# Patient Record
Sex: Male | Born: 1955 | Race: White | Hispanic: No | Marital: Single | Smoking: Never smoker
Health system: Southern US, Community
[De-identification: ages and names within clinical notes are randomized; demographics above are authoritative.]

## PROBLEM LIST (undated history)

## (undated) DIAGNOSIS — E039 Hypothyroidism, unspecified: Secondary | ICD-10-CM

## (undated) DIAGNOSIS — R1310 Dysphagia, unspecified: Secondary | ICD-10-CM

## (undated) DIAGNOSIS — E785 Hyperlipidemia, unspecified: Secondary | ICD-10-CM

## (undated) DIAGNOSIS — I10 Essential (primary) hypertension: Secondary | ICD-10-CM

## (undated) DIAGNOSIS — F3181 Bipolar II disorder: Secondary | ICD-10-CM

## (undated) HISTORY — DX: Dysphagia, unspecified: R13.10

## (undated) HISTORY — DX: Hypothyroidism, unspecified: E03.9

## (undated) HISTORY — DX: Hyperlipidemia, unspecified: E78.5

## (undated) HISTORY — DX: Bipolar II disorder: F31.81

## (undated) HISTORY — PX: KNEE SURGERY: SHX244

---

## 1998-06-21 ENCOUNTER — Ambulatory Visit (HOSPITAL_COMMUNITY): Admission: RE | Admit: 1998-06-21 | Discharge: 1998-06-21 | Payer: Self-pay

## 1999-03-09 ENCOUNTER — Inpatient Hospital Stay (HOSPITAL_COMMUNITY): Admission: EM | Admit: 1999-03-09 | Discharge: 1999-03-13 | Payer: Self-pay

## 1999-05-24 ENCOUNTER — Emergency Department (HOSPITAL_COMMUNITY): Admission: EM | Admit: 1999-05-24 | Discharge: 1999-05-25 | Payer: Self-pay

## 1999-05-25 ENCOUNTER — Encounter: Payer: Self-pay | Admitting: Emergency Medicine

## 2000-06-07 ENCOUNTER — Encounter: Payer: Self-pay | Admitting: Emergency Medicine

## 2000-06-08 ENCOUNTER — Inpatient Hospital Stay (HOSPITAL_COMMUNITY): Admission: EM | Admit: 2000-06-08 | Discharge: 2000-06-08 | Payer: Self-pay | Admitting: Emergency Medicine

## 2001-03-19 ENCOUNTER — Emergency Department (HOSPITAL_COMMUNITY): Admission: EM | Admit: 2001-03-19 | Discharge: 2001-03-19 | Payer: Self-pay | Admitting: Emergency Medicine

## 2001-03-19 ENCOUNTER — Encounter: Payer: Self-pay | Admitting: Emergency Medicine

## 2001-03-26 ENCOUNTER — Encounter: Admission: RE | Admit: 2001-03-26 | Discharge: 2001-03-26 | Payer: Self-pay

## 2001-04-28 ENCOUNTER — Encounter: Admission: RE | Admit: 2001-04-28 | Discharge: 2001-04-28 | Payer: Self-pay | Admitting: Internal Medicine

## 2001-04-29 ENCOUNTER — Encounter: Admission: RE | Admit: 2001-04-29 | Discharge: 2001-04-29 | Payer: Self-pay | Admitting: Internal Medicine

## 2002-05-15 ENCOUNTER — Encounter: Payer: Self-pay | Admitting: Emergency Medicine

## 2002-05-15 ENCOUNTER — Emergency Department (HOSPITAL_COMMUNITY): Admission: EM | Admit: 2002-05-15 | Discharge: 2002-05-15 | Payer: Self-pay | Admitting: Emergency Medicine

## 2007-06-14 ENCOUNTER — Emergency Department (HOSPITAL_COMMUNITY): Admission: EM | Admit: 2007-06-14 | Discharge: 2007-06-14 | Payer: Self-pay | Admitting: Emergency Medicine

## 2007-06-14 IMAGING — CT CT HEAD W/O CM
1 series · 16 of 30 positions shown, 20 images · IV contrast (agent unspecified)
Comparison: none

CLINICAL DATA: Dizziness and vomiting.
 HEAD CT WITHOUT CONTRAST:
TECHNIQUE: Contiguous axial images were obtained from the base of the skull through the vertex according to standard protocol without contrast.

[Series 2: head routine 4.8 h37s · axial · 0.48mm/px · z∈[+1186,+1341]mm · 16 of 36 slices shown, 20 images]
[im 2/36  brain]
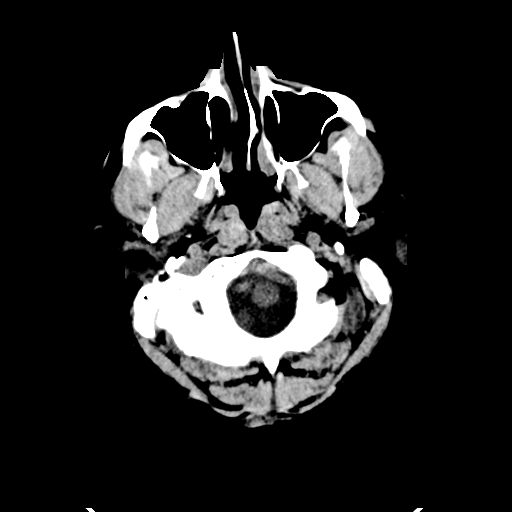
[im 2/36  bone]
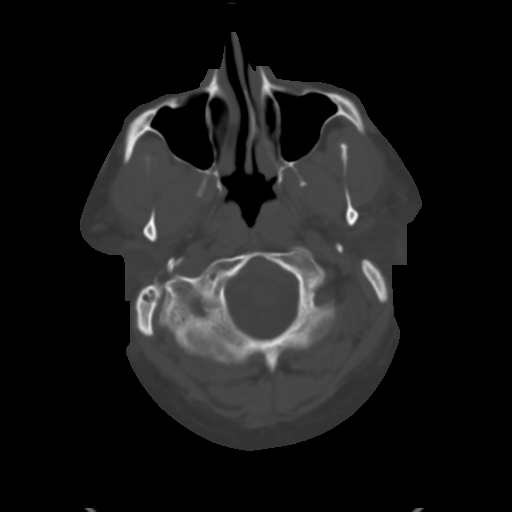
[im 4/36  brain]
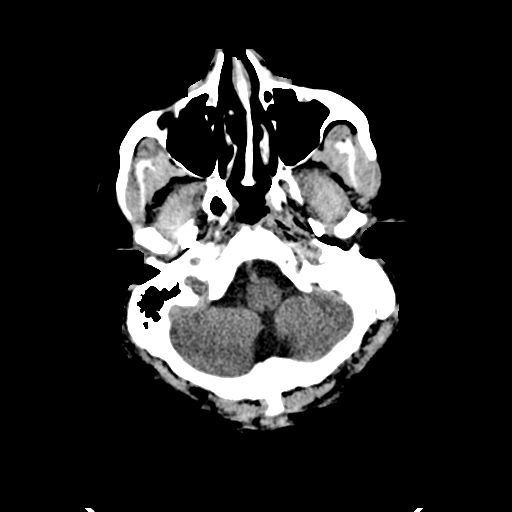
[im 7/36  brain]
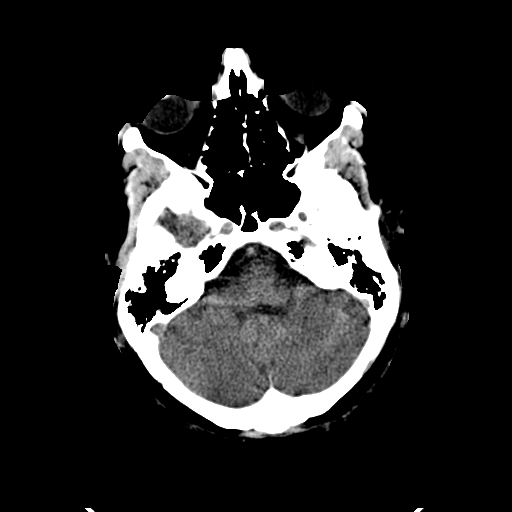
[im 9/36  brain]
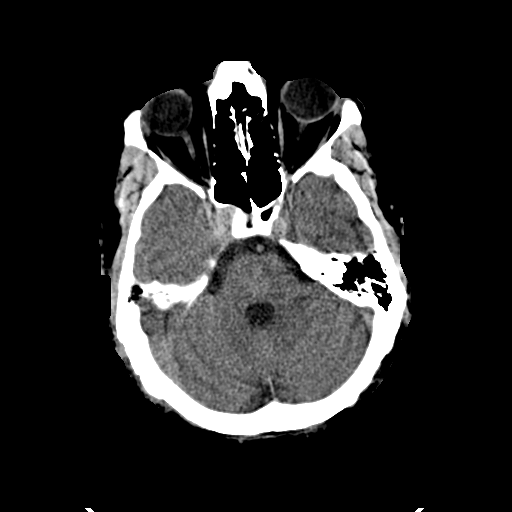
[im 10/36  brain]
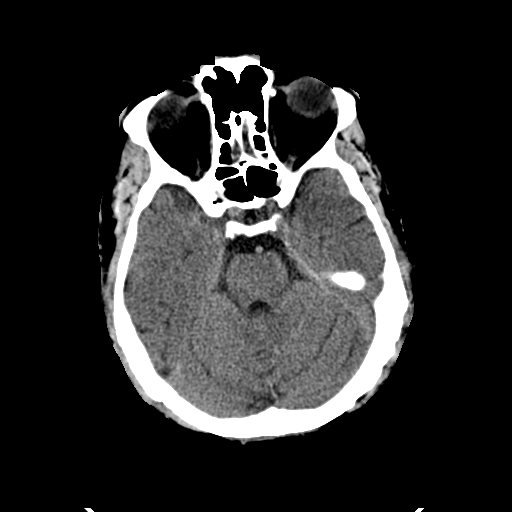
[im 10/36  bone]
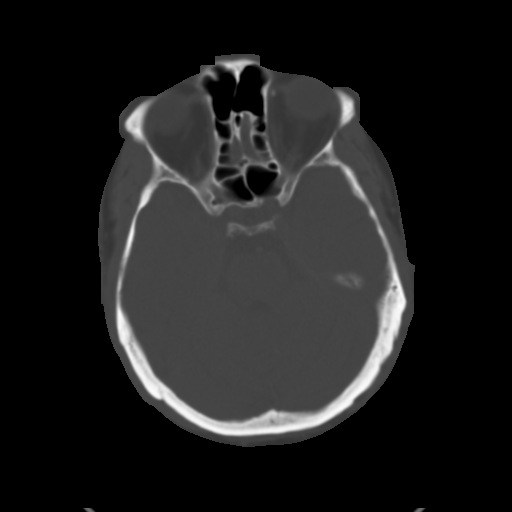
[im 13/36  brain]
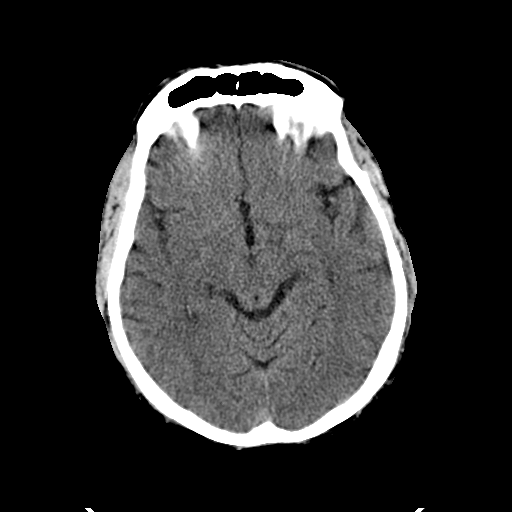
[im 15/36  brain]
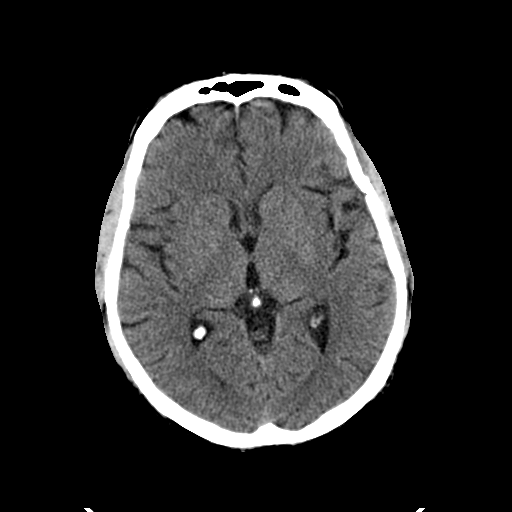
[im 17/36  brain]
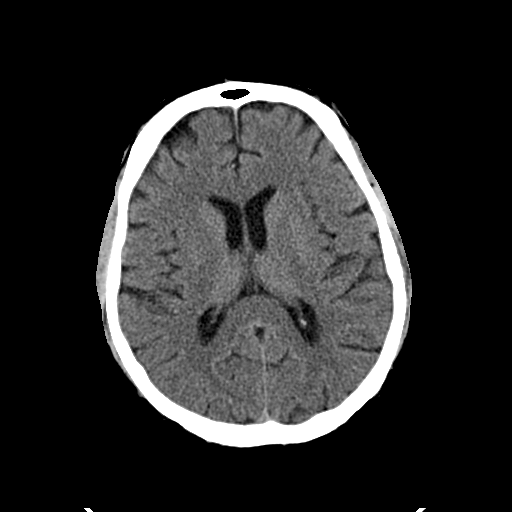
[im 19/36  brain]
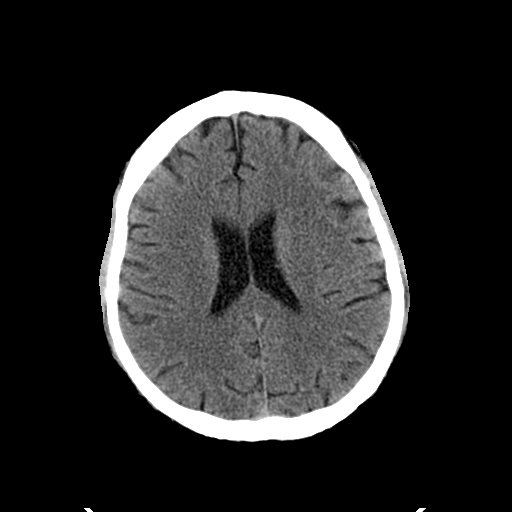
[im 19/36  bone]
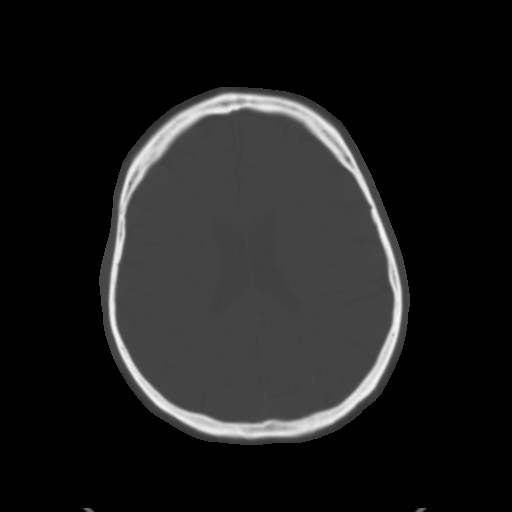
[im 21/36  brain]
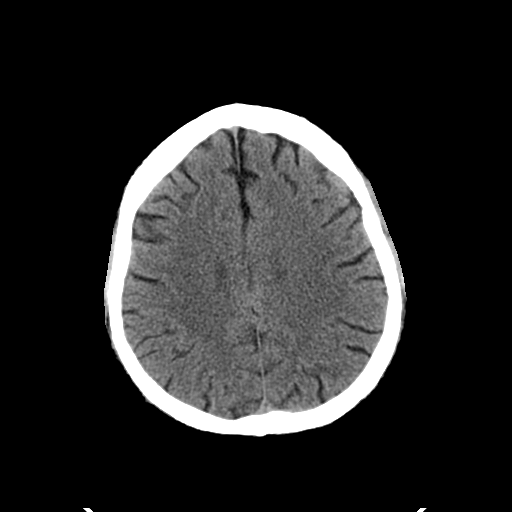
[im 23/36  brain]
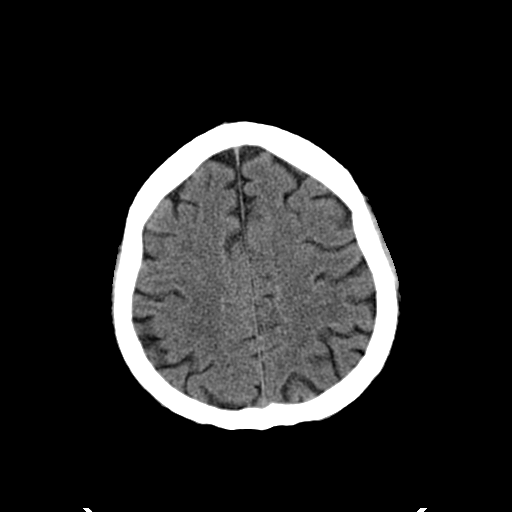
[im 26/36  brain]
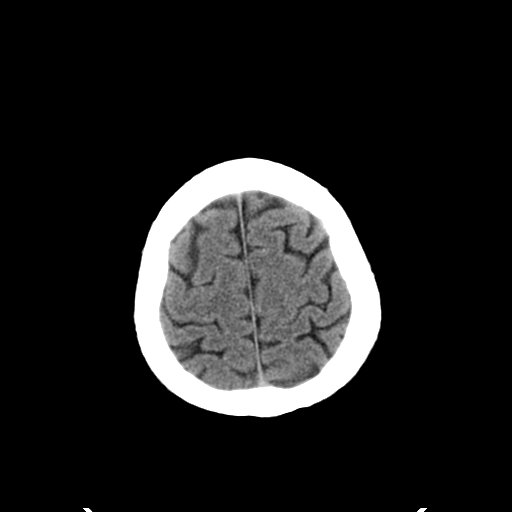
[im 27/36  brain]
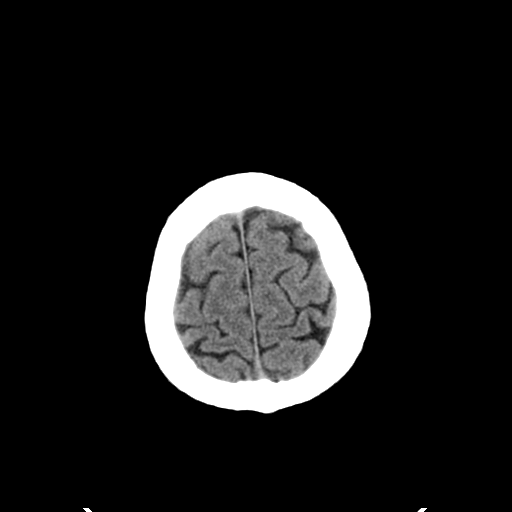
[im 27/36  bone]
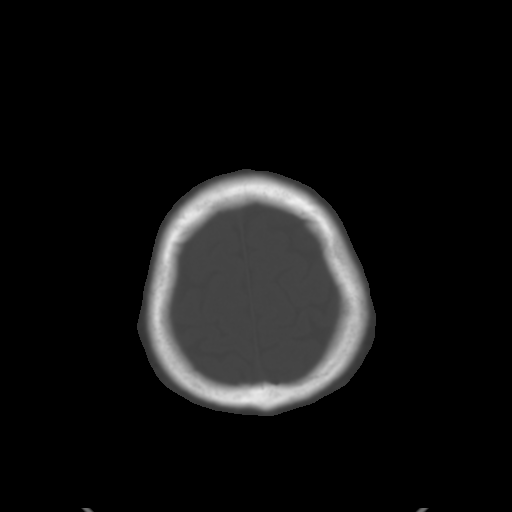
[im 29/36  brain]
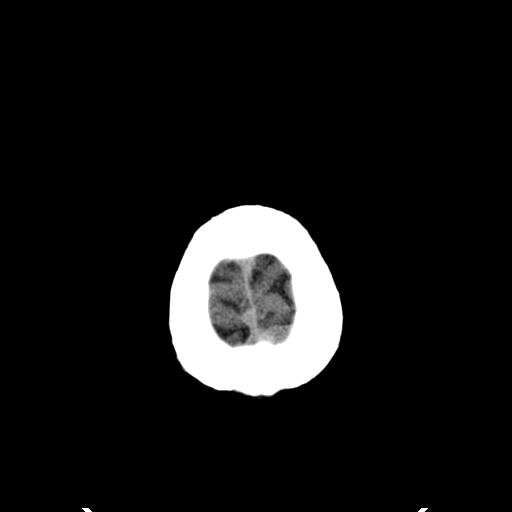
[im 32/36  brain]
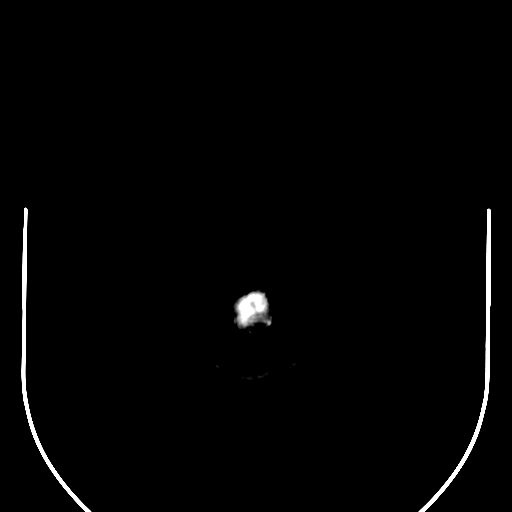
[im 34/36  brain]
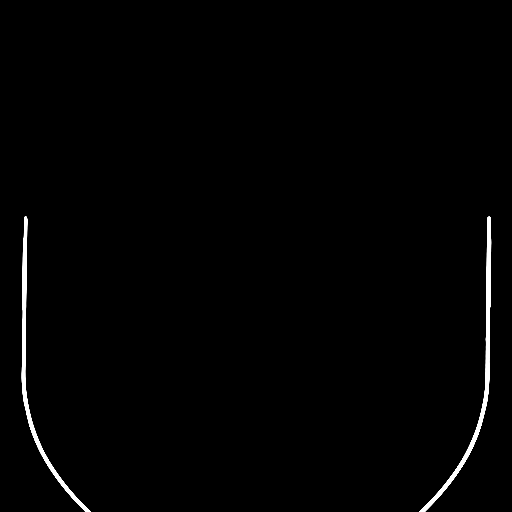

[16 of 30 positions shown; findings below may reference images not displayed]

FINDINGS: The cerebral and cerebellar hemispheres are normal in attenuation and morphology. 
 The midline is maintained. There is no edema or mass effect.  
 No abnormal extra-axial fluid collection, intracranial hemorrhage or mass is noted. 
 The paranasal sinuses and the mastoid air cells are normally aerated.
IMPRESSION: No acute findings.

## 2010-03-31 ENCOUNTER — Inpatient Hospital Stay (HOSPITAL_COMMUNITY)
Admission: EM | Admit: 2010-03-31 | Discharge: 2010-04-02 | Payer: Self-pay | Source: Home / Self Care | Attending: Internal Medicine | Admitting: Internal Medicine

## 2010-03-31 IMAGING — CR DG CHEST 2V
2 series · 2 of 2 positions shown · non-contrast
Comparison: None.

CLINICAL DATA: Hyperglycemia and cough.

CHEST - 2 VIEW

[w chest pa]
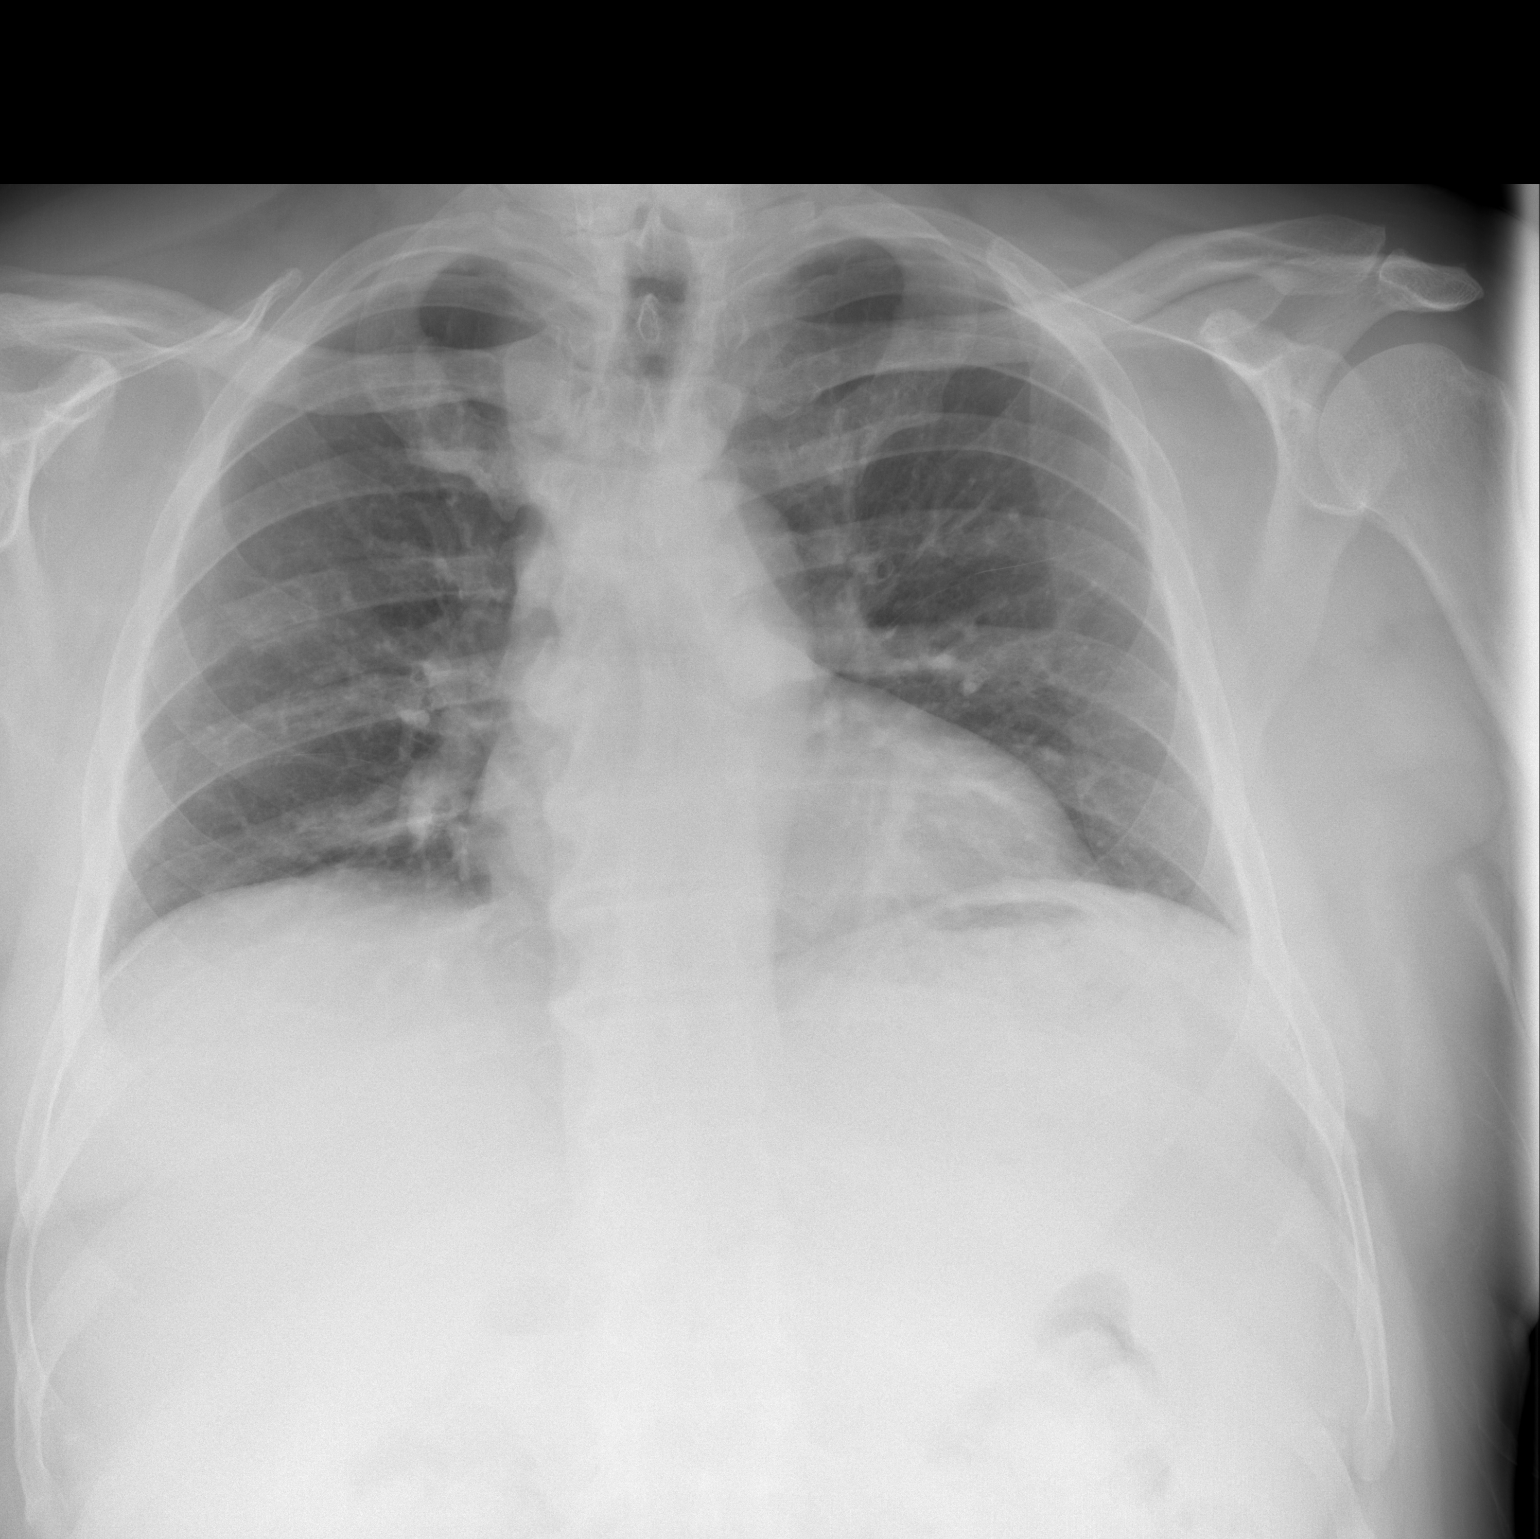

[w chest lat]
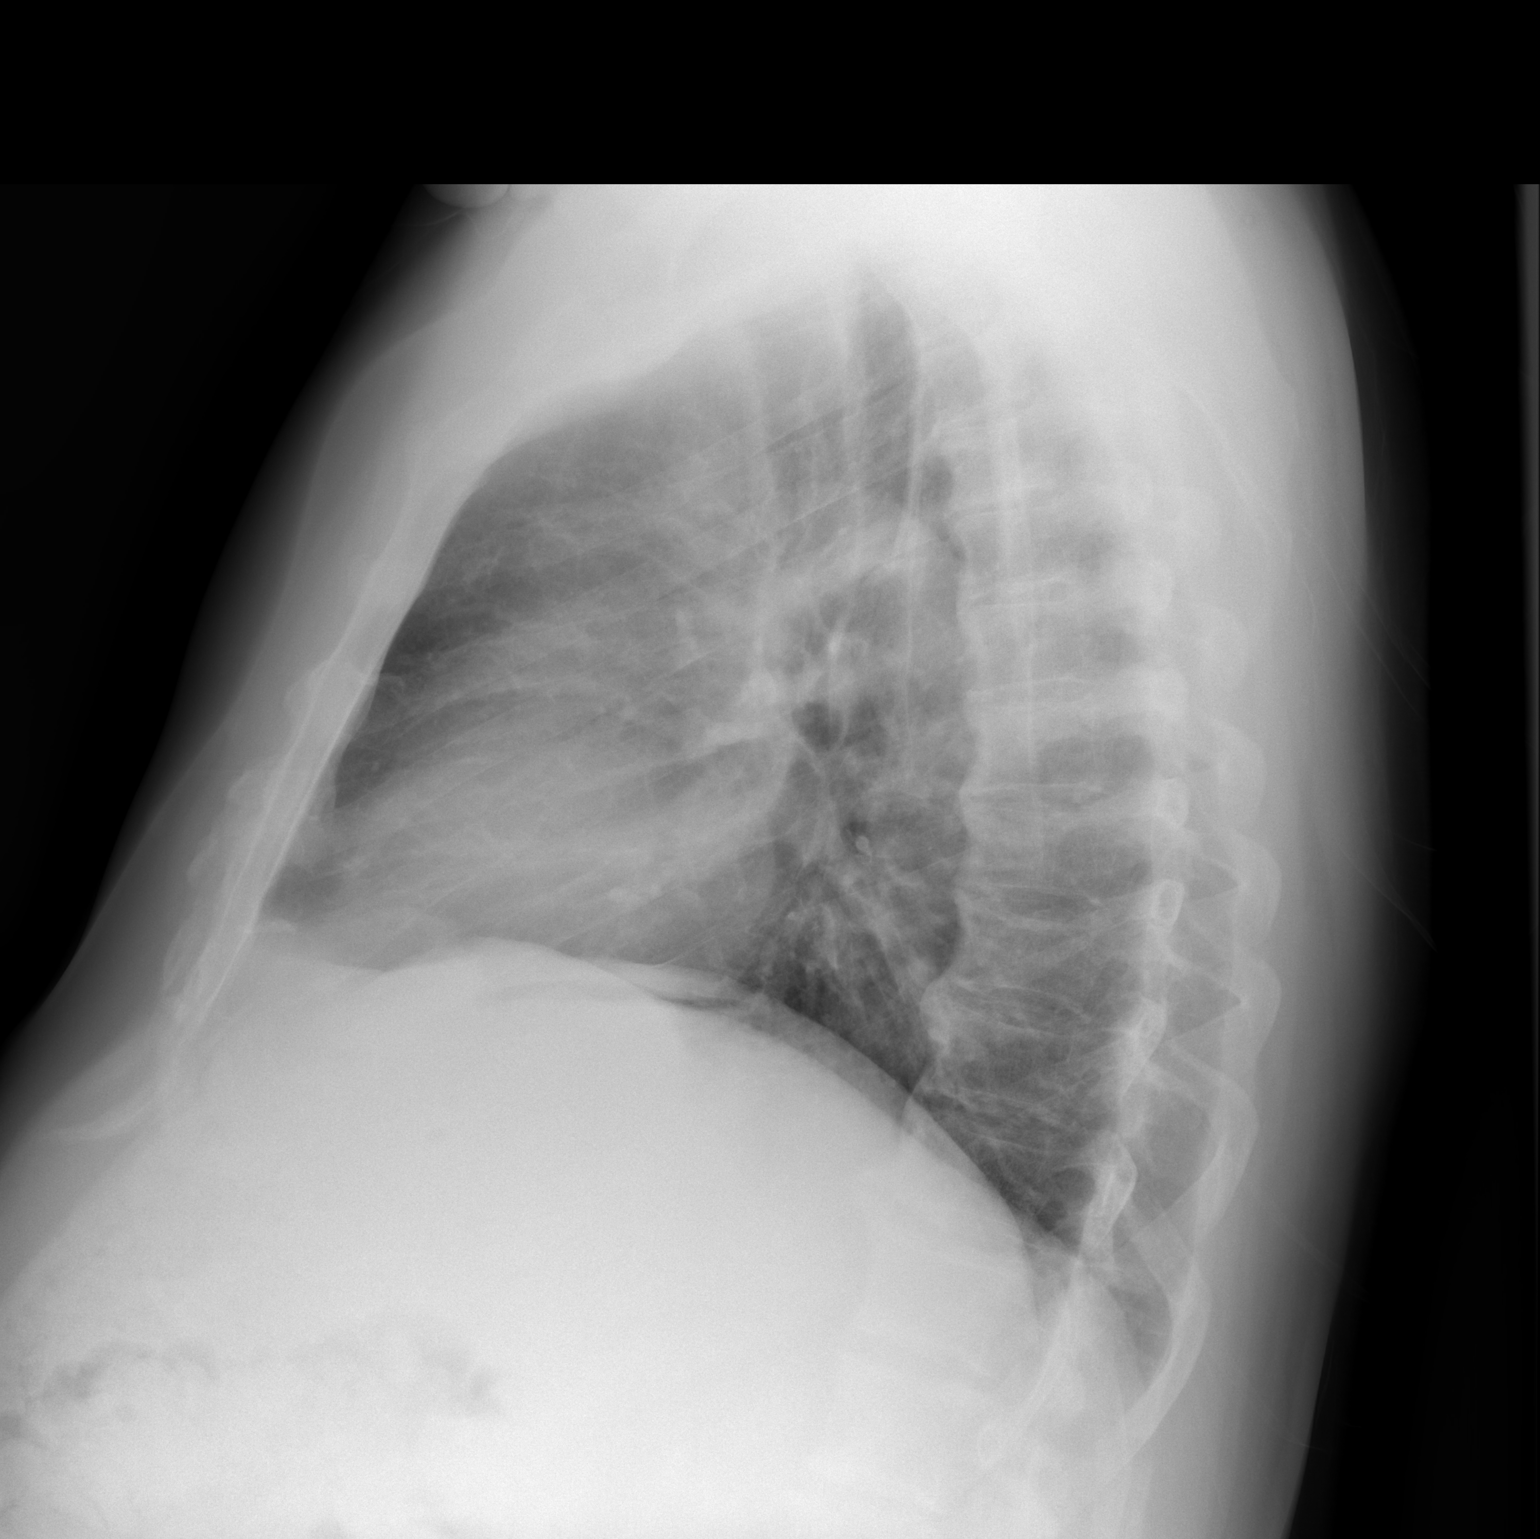

[2 of 2 positions shown; findings below may reference images not displayed]

FINDINGS: Bibasilar atelectasis present.  No edema, infiltrate or
pleural effusion.  Heart size is normal.  The bony thorax shows
diffuse degenerative changes of the thoracic spine.
IMPRESSION: Bibasilar atelectasis.

## 2010-05-13 ENCOUNTER — Encounter (HOSPITAL_COMMUNITY): Payer: Self-pay

## 2010-05-13 ENCOUNTER — Emergency Department (HOSPITAL_COMMUNITY): Payer: Self-pay

## 2010-05-13 ENCOUNTER — Emergency Department (HOSPITAL_COMMUNITY)
Admission: EM | Admit: 2010-05-13 | Discharge: 2010-05-13 | Disposition: A | Discharge status: Home / Self Care | Payer: Self-pay | Attending: Emergency Medicine | Admitting: Emergency Medicine

## 2010-05-13 DIAGNOSIS — R42 Dizziness and giddiness: Secondary | ICD-10-CM | POA: Insufficient documentation

## 2010-05-13 DIAGNOSIS — R21 Rash and other nonspecific skin eruption: Secondary | ICD-10-CM | POA: Insufficient documentation

## 2010-05-13 DIAGNOSIS — E785 Hyperlipidemia, unspecified: Secondary | ICD-10-CM | POA: Insufficient documentation

## 2010-05-13 DIAGNOSIS — E119 Type 2 diabetes mellitus without complications: Secondary | ICD-10-CM | POA: Insufficient documentation

## 2010-05-13 DIAGNOSIS — I10 Essential (primary) hypertension: Secondary | ICD-10-CM | POA: Insufficient documentation

## 2010-05-13 DIAGNOSIS — F319 Bipolar disorder, unspecified: Secondary | ICD-10-CM | POA: Insufficient documentation

## 2010-05-13 DIAGNOSIS — R209 Unspecified disturbances of skin sensation: Secondary | ICD-10-CM | POA: Insufficient documentation

## 2010-05-13 LAB — CBC
HCT: 41.7 % (ref 39.0–52.0)
Hemoglobin: 14.4 g/dL (ref 13.0–17.0)
MCH: 27.7 pg (ref 26.0–34.0)
MCHC: 34.5 g/dL (ref 30.0–36.0)
MCV: 80.2 fL (ref 78.0–100.0)
RBC: 5.2 MIL/uL (ref 4.22–5.81)
RDW: 13.1 % (ref 11.5–15.5)
WBC: 4.3 10*3/uL (ref 4.0–10.5)

## 2010-05-13 LAB — DIFFERENTIAL
Basophils Absolute: 0 10*3/uL (ref 0.0–0.1)
Basophils Relative: 1 % (ref 0–1)
Eosinophils Absolute: 0.1 10*3/uL (ref 0.0–0.7)
Eosinophils Relative: 3 % (ref 0–5)
Lymphocytes Relative: 32 % (ref 12–46)
Lymphs Abs: 1.4 10*3/uL (ref 0.7–4.0)
Monocytes Absolute: 0.3 10*3/uL (ref 0.1–1.0)
Monocytes Relative: 8 % (ref 3–12)
Neutro Abs: 2.5 10*3/uL (ref 1.7–7.7)
Neutrophils Relative %: 56 % (ref 43–77)
Smear Review: ADEQUATE

## 2010-05-13 LAB — BASIC METABOLIC PANEL
BUN: 9 mg/dL (ref 6–23)
CO2: 26 mEq/L (ref 19–32)
Calcium: 10.1 mg/dL (ref 8.4–10.5)
Chloride: 101 mEq/L (ref 96–112)
Creatinine, Ser: 0.82 mg/dL (ref 0.4–1.5)
GFR calc Af Amer: >60 mL/min (ref >60)
GFR calc non Af Amer: >60 mL/min (ref >60)
Glucose, Bld: 296 mg/dL — ABNORMAL HIGH (ref 70–99)
Potassium: 3.7 mEq/L (ref 3.5–5.1)
Sodium: 138 mEq/L (ref 135–145)

## 2010-05-13 LAB — LITHIUM LEVEL: Lithium Lvl: 0.34 mEq/L — ABNORMAL LOW (ref 0.80–1.40)

## 2010-05-13 IMAGING — CT CT HEAD W/O CM
1 series · 16 of 30 positions shown, 20 images · non-contrast
Comparison: Head CT scan [DATE].

CLINICAL DATA: Weakness and dizziness.

CT HEAD WITHOUT CONTRAST
TECHNIQUE: Contiguous axial images were obtained from the base of
the skull through the vertex without contrast.

[Series 2: head_seq 4.5 h37s st · axial · 0.43mm/px · z∈[+1214,+1362]mm · 16 of 36 slices shown, 20 images]
[im 2/36  brain]
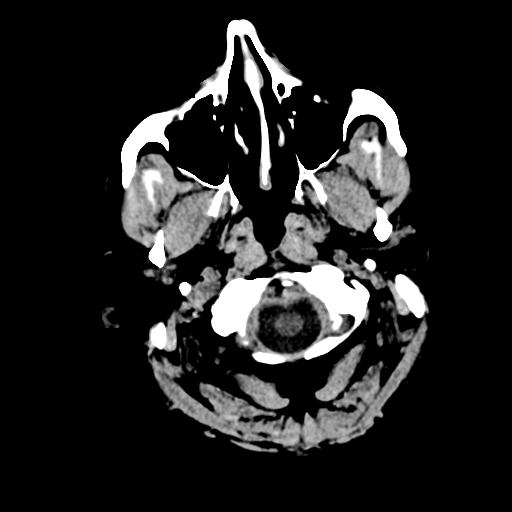
[im 2/36  bone]
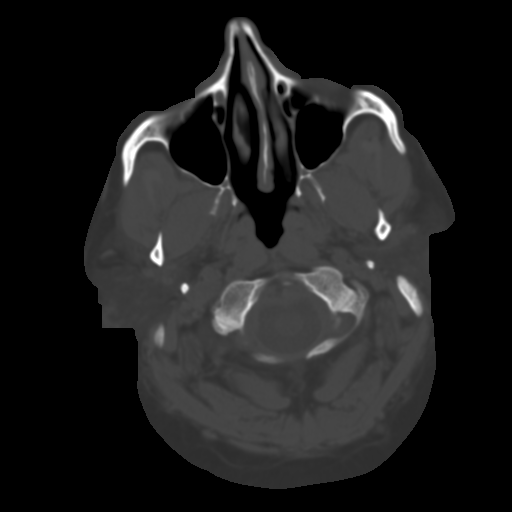
[im 4/36  brain]
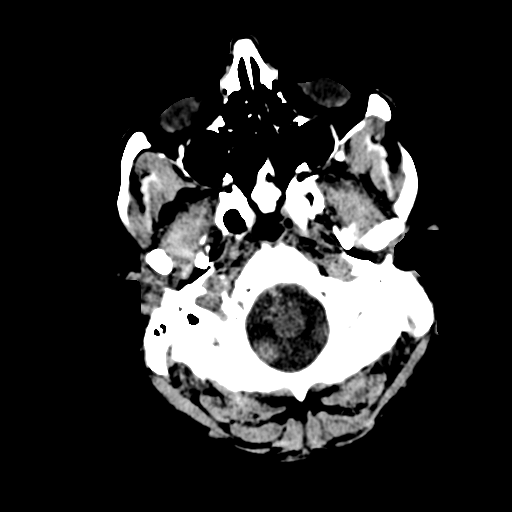
[im 7/36  brain]
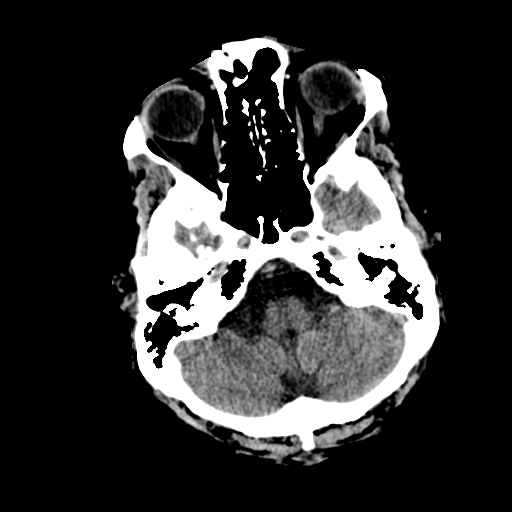
[im 9/36  brain]
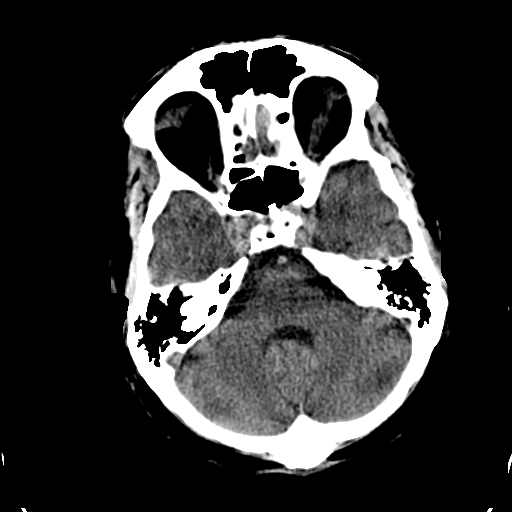
[im 10/36  brain]
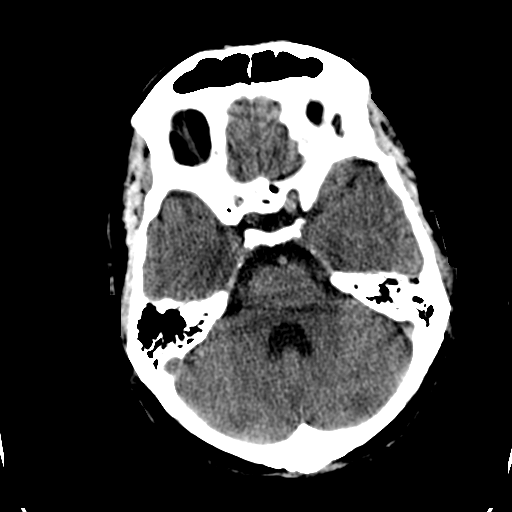
[im 10/36  bone]
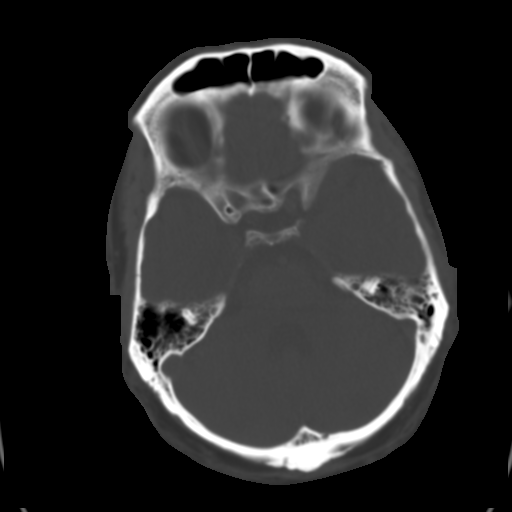
[im 13/36  brain]
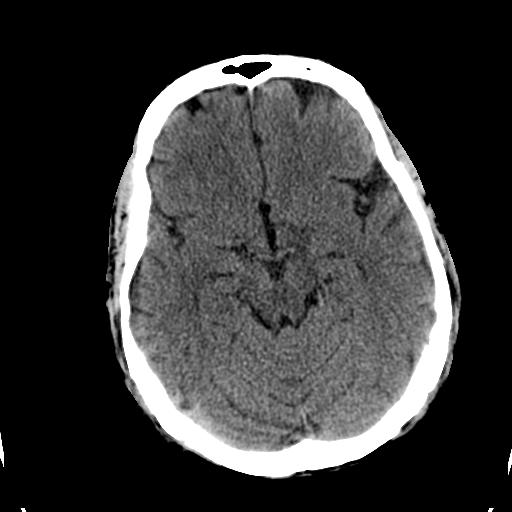
[im 15/36  brain]
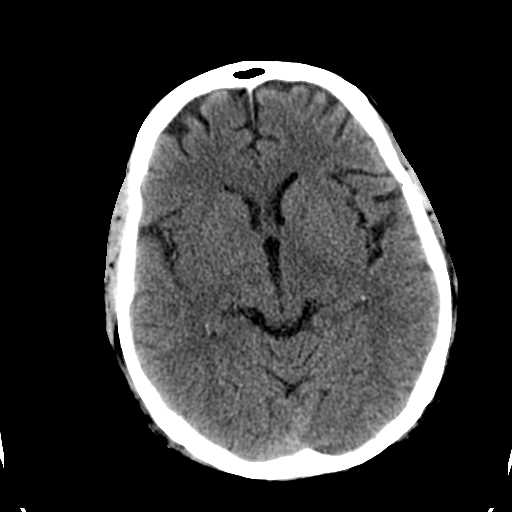
[im 17/36  brain]
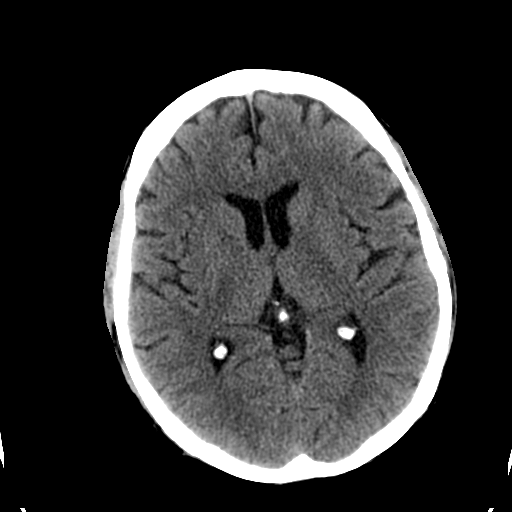
[im 19/36  brain]
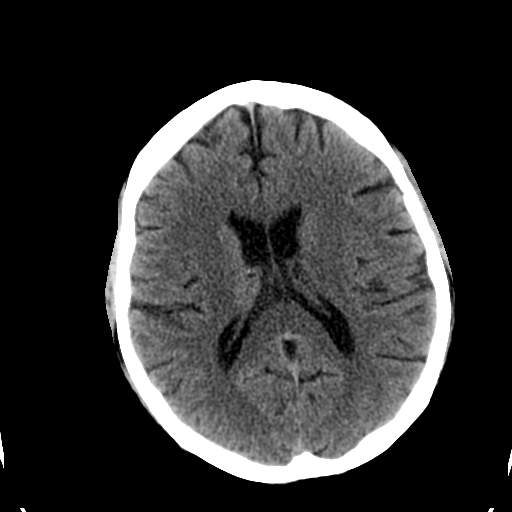
[im 19/36  bone]
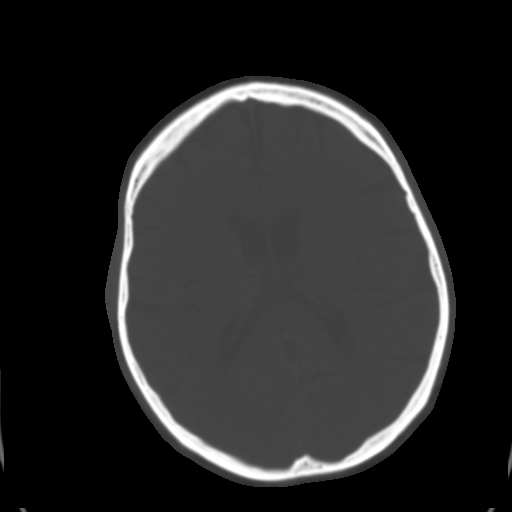
[im 21/36  brain]
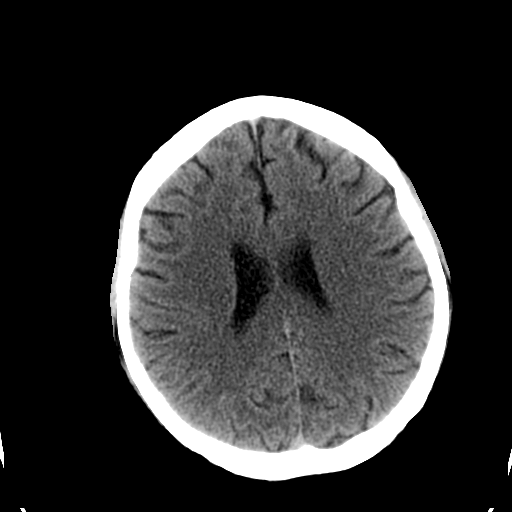
[im 23/36  brain]
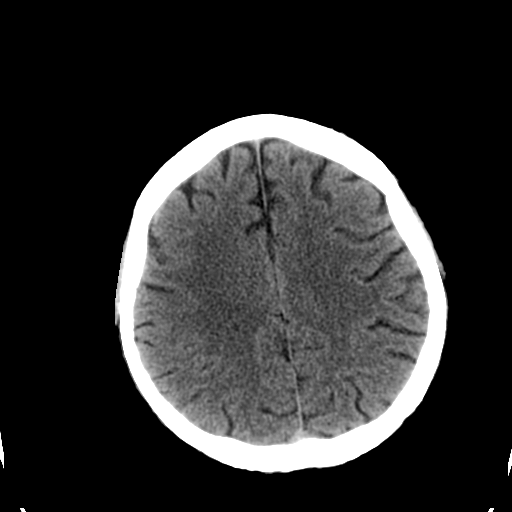
[im 26/36  brain]
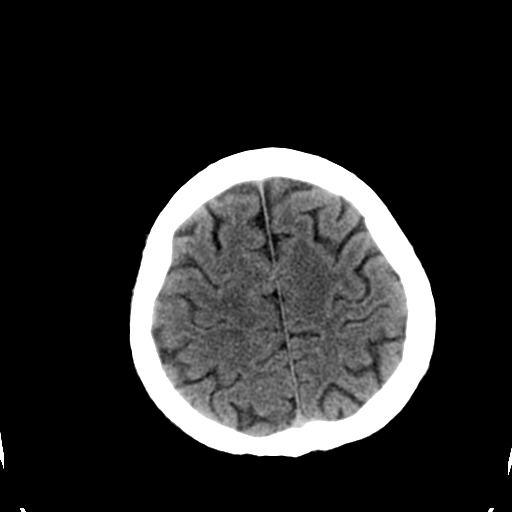
[im 27/36  brain]
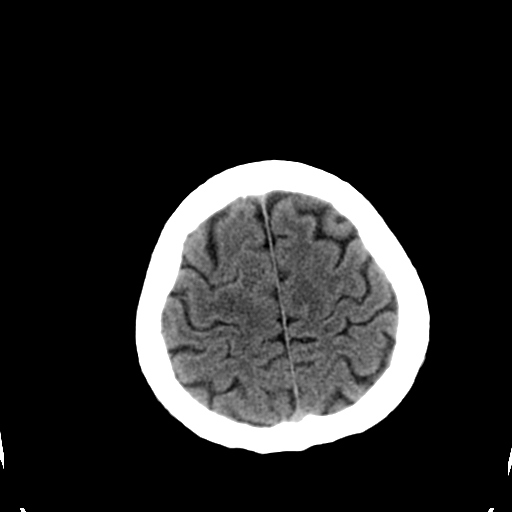
[im 27/36  bone]
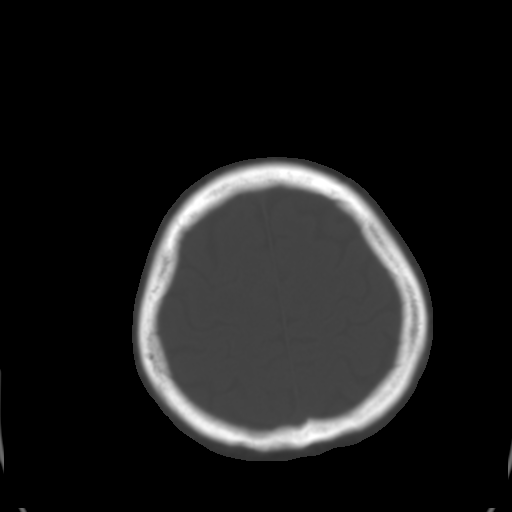
[im 29/36  brain]
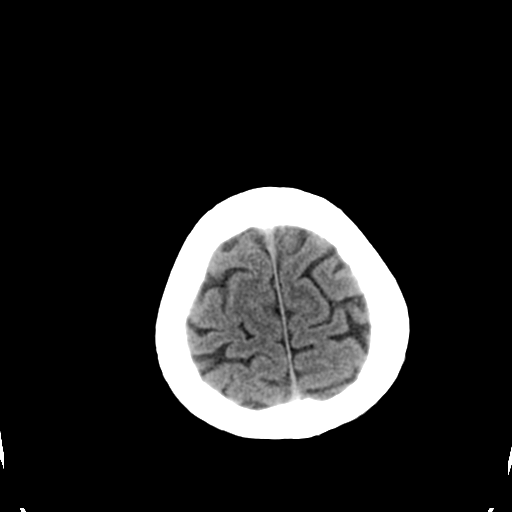
[im 32/36  brain]
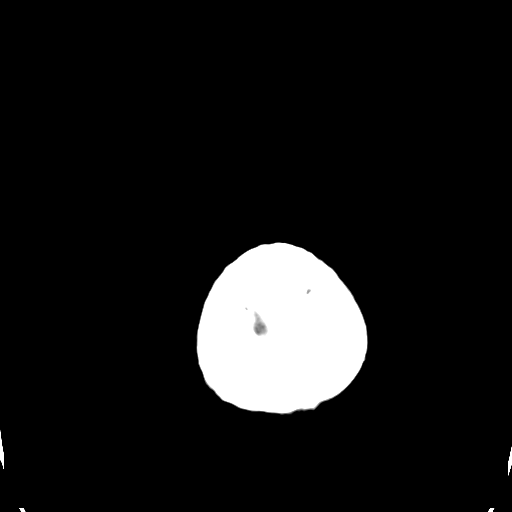
[im 34/36  brain]
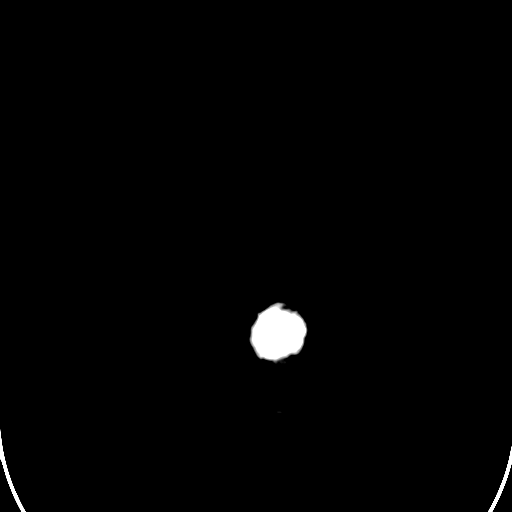

[16 of 30 positions shown; findings below may reference images not displayed]

FINDINGS: The brain appears normal without evidence of acute
infarction, hemorrhage, mass lesion, mass effect, midline shift or
abnormal extra-axial fluid collection.  No pneumocephalus or
hydrocephalus.  Visualized paranasal sinuses and mastoid air cells
are clear.  Calvarium intact.
IMPRESSION: Negative exam.

## 2010-06-10 LAB — COMPREHENSIVE METABOLIC PANEL
ALT: 22 U/L (ref 0–53)
AST: 23 U/L (ref 0–37)
Albumin: 4 g/dL (ref 3.5–5.2)
Alkaline Phosphatase: 83 U/L (ref 39–117)
BUN: 14 mg/dL (ref 6–23)
CO2: 27 mEq/L (ref 19–32)
Calcium: 9.4 mg/dL (ref 8.4–10.5)
Chloride: 93 mEq/L — ABNORMAL LOW (ref 96–112)
Creatinine, Ser: 1.12 mg/dL (ref 0.4–1.5)
GFR calc Af Amer: >60 mL/min (ref >60)
GFR calc non Af Amer: >60 mL/min (ref >60)
Glucose, Bld: 767 mg/dL (ref 70–99)
Potassium: 4.6 mEq/L (ref 3.5–5.1)
Sodium: 128 mEq/L — ABNORMAL LOW (ref 135–145)
Total Bilirubin: 0.2 mg/dL — ABNORMAL LOW (ref 0.3–1.2)
Total Protein: 6.8 g/dL (ref 6.0–8.3)

## 2010-06-10 LAB — DIFFERENTIAL
Basophils Absolute: 0 10*3/uL (ref 0.0–0.1)
Basophils Relative: 1 % (ref 0–1)
Eosinophils Absolute: 0.1 10*3/uL (ref 0.0–0.7)
Eosinophils Relative: 3 % (ref 0–5)
Lymphocytes Relative: 29 % (ref 12–46)
Lymphs Abs: 1.3 10*3/uL (ref 0.7–4.0)
Monocytes Absolute: 0.3 10*3/uL (ref 0.1–1.0)
Monocytes Relative: 7 % (ref 3–12)
Neutro Abs: 2.6 10*3/uL (ref 1.7–7.7)
Neutrophils Relative %: 60 % (ref 43–77)

## 2010-06-10 LAB — GLUCOSE, CAPILLARY
Glucose-Capillary: 115 mg/dL — ABNORMAL HIGH (ref 70–99)
Glucose-Capillary: 203 mg/dL — ABNORMAL HIGH (ref 70–99)
Glucose-Capillary: 204 mg/dL — ABNORMAL HIGH (ref 70–99)
Glucose-Capillary: 253 mg/dL — ABNORMAL HIGH (ref 70–99)
Glucose-Capillary: 280 mg/dL — ABNORMAL HIGH (ref 70–99)
Glucose-Capillary: 298 mg/dL — ABNORMAL HIGH (ref 70–99)
Glucose-Capillary: 327 mg/dL — ABNORMAL HIGH (ref 70–99)
Glucose-Capillary: 468 mg/dL — ABNORMAL HIGH (ref 70–99)
Glucose-Capillary: >600 mg/dL (ref 70–99)
Glucose-Capillary: >600 mg/dL (ref 70–99)
Glucose-Capillary: >600 mg/dL (ref 70–99)

## 2010-06-10 LAB — URINE MICROSCOPIC-ADD ON

## 2010-06-10 LAB — HEMOGLOBIN A1C
Hgb A1c MFr Bld: 11.2 % — ABNORMAL HIGH (ref <5.7)
Mean Plasma Glucose: 275 mg/dL — ABNORMAL HIGH (ref <117)

## 2010-06-10 LAB — CBC
HCT: 40.4 % (ref 39.0–52.0)
Hemoglobin: 14.3 g/dL (ref 13.0–17.0)
MCH: 28.5 pg (ref 26.0–34.0)
MCHC: 35.4 g/dL (ref 30.0–36.0)
MCV: 80.5 fL (ref 78.0–100.0)
Platelets: 143 10*3/uL — ABNORMAL LOW (ref 150–400)
RBC: 5.02 MIL/uL (ref 4.22–5.81)
RDW: 13.1 % (ref 11.5–15.5)
WBC: 4.4 10*3/uL (ref 4.0–10.5)

## 2010-06-10 LAB — BASIC METABOLIC PANEL
BUN: 8 mg/dL (ref 6–23)
CO2: 26 mEq/L (ref 19–32)
Calcium: 8.5 mg/dL (ref 8.4–10.5)
Chloride: 102 mEq/L (ref 96–112)
GFR calc Af Amer: >60 mL/min (ref >60)
GFR calc non Af Amer: >60 mL/min (ref >60)
GFR calc non Af Amer: >60 mL/min (ref >60)
Glucose, Bld: 305 mg/dL — ABNORMAL HIGH (ref 70–99)
Potassium: 3.7 mEq/L (ref 3.5–5.1)
Potassium: 4.5 mEq/L (ref 3.5–5.1)
Sodium: 134 mEq/L — ABNORMAL LOW (ref 135–145)
Sodium: 140 mEq/L (ref 135–145)

## 2010-06-10 LAB — URINALYSIS, ROUTINE W REFLEX MICROSCOPIC
Bilirubin Urine: NEGATIVE
Glucose, UA: >1000 mg/dL — AB
Hgb urine dipstick: NEGATIVE
Ketones, ur: NEGATIVE mg/dL
Leukocytes, UA: NEGATIVE
Nitrite: NEGATIVE
Protein, ur: NEGATIVE mg/dL
Specific Gravity, Urine: 1.024 (ref 1.005–1.030)
Urobilinogen, UA: 0.2 mg/dL (ref 0.0–1.0)
pH: 6 (ref 5.0–8.0)

## 2010-08-16 NOTE — H&P (Signed)
[place]. [facility]  Patient:    Matthew Barnes, Matthew Barnes                         MRN: [id number] Adm. Date:  [id number] Attending:  Nolene Ebbs Iv                         History and Physical  CURRENT COMPLAINT:  Chest pain.  HISTORY OF PRESENT ILLNESS:  A 55 year old gentleman who complains of chest pain and shortness of breath off and on over the last two weeks but becoming fairly constant, which worsened today as he was driving home from [place]. Worsened about 8 a.m. and consisted of substernal and left-sided chest pain with tingling down to his left arm.  He has not really noticed any shortness of breath today but some dyspnea on exertion recently.  No nausea or diaphoresis.  There is some pain in the chest with movement of the left arm but he will get a little bit of this on the right side as well.  He has had some GI reflux and abdominal bloating recently and was given some Prilosec which did seem to help.  He is now out of that.  There is no radiation of the pain.  Severity at its worst was 8-10/10.  He has been under a lot of stress and does feel this has worsened it.  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  Risk factors include family history - his father having had, he thinks, three MIs beginning in his 77s.  He has had some borderline elevated blood pressure in the past but does not smoke.  No history of diabetes.  He is not sure of his cholesterol level.  PAST MEDICAL HISTORY: 1. Significant for bipolar disorder.  Psychiatrist is Dr. Madaline Guthrie and he has    been well controlled with no major medicine adjustments recently. 2. Left knee arthroscopy recently by Dr. Priscille Kluver.  Occurred approximately    three weeks ago.  MEDICATIONS: 1. Seroquel 125 mg q.h.s. 2. Klonopin 4 mg q.h.s. 3. Lithobid 600 mg b.i.d. 4. Wellbutrin SR 300 mg q.a.m. 5. Topamax 50 mg b.i.d. 6. Gabitril 6 mg b.i.d.  ALLERGIES:  No known  drug allergies.  FAMILY HISTORY:  Coronary artery disease in the father who has also had diabetes and patient thinks he is probably bipolar as well.  Hypertension in multiple relatives.  SOCIAL HISTORY:  He works in the family [employer]i but he is looking for other alternatives due to the stress level.  No children.  No tobacco and rare alcohol.  REVIEW OF SYSTEMS:  He has some occasional herpetic-type fever blisters. Denies any fever, sore throat, cough, chest congestion, constipation, diarrhea, or GU symptoms.  Otherwise as per HPI.  PHYSICAL EXAMINATION:  VITAL SIGNS:  Temperature 97.1, blood pressure 131/89, pulse 82, respirations 20, O2 saturation 99%.  GENERAL:  A well-developed, moderately-overweight male in no acute distress.  HEENT:  Atraumatic, normocephalic.  Pupils are equal, round and reactive to light.  Fundi normal.  Mouth and oropharynx normal.  TMs normal.  NECK:  Supple without adenopathy, nontender.  LUNGS:  Clear.  HEART:  Regular rate and rhythm, no murmur, gallop, or rub.  ABDOMEN:  Positive bowel sounds, soft, nontender throughout.  No rebound, guarding, or masses.  EXTREMITIES:  No edema, normal pulses.  NEUROLOGIC:  Muscle strength 5/5 throughout.  Deep  tendon reflexes 2+ and symmetrical.  GENITOURINARY:  Deferred.  LABORATORY DATA:  Chest x-ray with no acute disease.  EKG with no acute changes, mild nonspecific ST-T changes.  Troponin I less than 0.01, CK 177, MB 1.9.  WBC 6.2, 49 neutrophils, 37 lymphs, hemoglobin 14.4.  Platelets 167,000.  Sodium 144, potassium 3.3, chloride 109, CO2 26, BUN 9, creatinine 0.9, glucose 86.  IMPRESSION: 1. Chest pain, rule out myocardial infarction but pain is atypical.  Pain    somewhat positional but not obviously musculoskeletal.  Possibly GI    related.  Underlying stress is certainly an exacerbating factor. 2. Bipolar disorder - which appears stable. 3. Hypokalemia.  PLAN: 1. Admit to telemetry.  Check  serial enzymes and EKG.  Give potassium. 2. Start Protonix 40 mg q.d. 3. Will probably need treadmill test or Cardiolite if he rules out.  Will    continue his usual medications.DD:  06/08/00 TD:  06/08/00 Job: [id number]

## 2010-12-03 ENCOUNTER — Emergency Department (HOSPITAL_COMMUNITY)
Admission: EM | Admit: 2010-12-03 | Discharge: 2010-12-03 | Disposition: A | Discharge status: Home / Self Care | Payer: Self-pay | Attending: Emergency Medicine | Admitting: Emergency Medicine

## 2010-12-03 DIAGNOSIS — E119 Type 2 diabetes mellitus without complications: Secondary | ICD-10-CM | POA: Insufficient documentation

## 2010-12-03 DIAGNOSIS — Z794 Long term (current) use of insulin: Secondary | ICD-10-CM | POA: Insufficient documentation

## 2010-12-03 DIAGNOSIS — B354 Tinea corporis: Secondary | ICD-10-CM | POA: Insufficient documentation

## 2010-12-03 DIAGNOSIS — I10 Essential (primary) hypertension: Secondary | ICD-10-CM | POA: Insufficient documentation

## 2010-12-03 DIAGNOSIS — R21 Rash and other nonspecific skin eruption: Secondary | ICD-10-CM | POA: Insufficient documentation

## 2010-12-23 LAB — COMPREHENSIVE METABOLIC PANEL
Albumin: 3.8
BUN: 9
Calcium: 9.2
Creatinine, Ser: 1.05
GFR calc Af Amer: >60
Total Bilirubin: 0.5
Total Protein: 6.7

## 2010-12-23 LAB — CBC
HCT: 40.2
MCHC: 34.4
MCV: 81.8
Platelets: 122 — ABNORMAL LOW
RDW: 13.3

## 2010-12-23 LAB — DIFFERENTIAL
Basophils Absolute: 0
Lymphocytes Relative: 17
Lymphs Abs: 1
Monocytes Absolute: 0.2
Monocytes Relative: 3
Neutro Abs: 4.3

## 2010-12-28 ENCOUNTER — Emergency Department (HOSPITAL_COMMUNITY)
Admission: EM | Admit: 2010-12-28 | Discharge: 2010-12-28 | Disposition: A | Discharge status: Home / Self Care | Payer: Self-pay | Attending: Emergency Medicine | Admitting: Emergency Medicine

## 2010-12-28 DIAGNOSIS — I10 Essential (primary) hypertension: Secondary | ICD-10-CM | POA: Insufficient documentation

## 2010-12-28 DIAGNOSIS — R21 Rash and other nonspecific skin eruption: Secondary | ICD-10-CM | POA: Insufficient documentation

## 2010-12-28 DIAGNOSIS — L989 Disorder of the skin and subcutaneous tissue, unspecified: Secondary | ICD-10-CM | POA: Insufficient documentation

## 2010-12-28 DIAGNOSIS — Z79899 Other long term (current) drug therapy: Secondary | ICD-10-CM | POA: Insufficient documentation

## 2010-12-28 DIAGNOSIS — E119 Type 2 diabetes mellitus without complications: Secondary | ICD-10-CM | POA: Insufficient documentation

## 2011-04-10 ENCOUNTER — Emergency Department (HOSPITAL_COMMUNITY)
Admission: EM | Admit: 2011-04-10 | Discharge: 2011-04-10 | Disposition: A | Discharge status: Home / Self Care | Payer: Self-pay | Attending: Emergency Medicine | Admitting: Emergency Medicine

## 2011-04-10 ENCOUNTER — Encounter (HOSPITAL_COMMUNITY): Payer: Self-pay | Admitting: *Deleted

## 2011-04-10 DIAGNOSIS — I10 Essential (primary) hypertension: Secondary | ICD-10-CM | POA: Insufficient documentation

## 2011-04-10 DIAGNOSIS — Z79899 Other long term (current) drug therapy: Secondary | ICD-10-CM | POA: Insufficient documentation

## 2011-04-10 DIAGNOSIS — R350 Frequency of micturition: Secondary | ICD-10-CM | POA: Insufficient documentation

## 2011-04-10 DIAGNOSIS — E119 Type 2 diabetes mellitus without complications: Secondary | ICD-10-CM | POA: Insufficient documentation

## 2011-04-10 HISTORY — DX: Essential (primary) hypertension: I10

## 2011-04-10 LAB — URINALYSIS, ROUTINE W REFLEX MICROSCOPIC
Glucose, UA: >1000 mg/dL — AB
Leukocytes, UA: NEGATIVE
pH: 6.5 (ref 5.0–8.0)

## 2011-04-10 LAB — BASIC METABOLIC PANEL
Chloride: 95 mEq/L — ABNORMAL LOW (ref 96–112)
GFR calc non Af Amer: >90 mL/min (ref >90)
Glucose, Bld: 486 mg/dL — ABNORMAL HIGH (ref 70–99)
Potassium: 4.3 mEq/L (ref 3.5–5.1)
Sodium: 131 mEq/L — ABNORMAL LOW (ref 135–145)

## 2011-04-10 LAB — DIFFERENTIAL
Basophils Relative: 1 % (ref 0–1)
Eosinophils Relative: 4 % (ref 0–5)
Lymphocytes Relative: 30 % (ref 12–46)
Monocytes Absolute: 0.2 10*3/uL (ref 0.1–1.0)
Neutrophils Relative %: 60 % (ref 43–77)

## 2011-04-10 LAB — BLOOD GAS, VENOUS
FIO2: 0.21 %
Patient temperature: 98.6
pH, Ven: 7.4 — ABNORMAL HIGH (ref 7.250–7.300)

## 2011-04-10 LAB — CBC
Hemoglobin: 13.8 g/dL (ref 13.0–17.0)
MCH: 26.9 pg (ref 26.0–34.0)
RBC: 5.13 MIL/uL (ref 4.22–5.81)

## 2011-04-10 LAB — GLUCOSE, CAPILLARY: Glucose-Capillary: 478 mg/dL — ABNORMAL HIGH (ref 70–99)

## 2011-04-10 LAB — URINE MICROSCOPIC-ADD ON: Urine-Other: NONE SEEN

## 2011-04-10 MED ORDER — SODIUM CHLORIDE 0.9 % IV BOLUS (SEPSIS)
1000.0000 mL | Freq: Once | INTRAVENOUS | Status: AC
  Administered 2011-04-10: 1000 mL via INTRAVENOUS

## 2011-04-10 MED ORDER — ACETAMINOPHEN 325 MG PO TABS
650.0000 mg | ORAL_TABLET | Freq: Once | ORAL | Status: AC
  Administered 2011-04-10: 650 mg via ORAL
  Filled 2011-04-10: qty 2

## 2011-04-10 NOTE — ED Notes (Signed)
cbg 478 

## 2011-04-10 NOTE — ED Notes (Signed)
Pt states for the past week he has frequent urination and thirst. Pt states he can feel when his blood sugar is low but does not feel any different when it is high. Pt states he wakes up about 5 times a night due to frequent urination

## 2011-04-10 NOTE — ED Provider Notes (Signed)
History     CSN: [id number]  Arrival date & time 04/10/11  1138   First MD Initiated Contact with Patient 04/10/11 1218      Chief Complaint  Patient presents with  . Hyperglycemia    (Consider location/radiation/quality/duration/timing/severity/associated sxs/prior treatment) The history is provided by the patient.  Pt has a hx of DM; he takes 1500 mg of metformin daily. He also Lantus that he takes "when I feel like my sugar is high," but states that he does not like taking this as "it seems to interact with my bipolar meds." His PCP is Dr. Patsy Lager but he is not able to see her regularly due to financial issues. He presents today as he has had frequent urination and increased thirst over the past several months, which has concerned him. He does not have any other urinary sx such as pyuria or a sensation of incomplete emptying. He has no known hx of BPH. He does not check his blood glucoses on a regular basis, but recalls recent numbers in the high 300s. Denies abdominal pain, nausea, vomiting.   Past Medical History  Diagnosis Date  . Diabetes mellitus   . Hypertension     Past Surgical History  Procedure Date  . Knee surgery     No family history on file.  History  Substance Use Topics  . Smoking status: Never Smoker   . Smokeless tobacco: Not on file  . Alcohol Use: No      Review of Systems  Constitutional: Negative for fever, chills, activity change, appetite change and unexpected weight change.  Eyes: Negative for visual disturbance.  Respiratory: Negative for cough and shortness of breath.   Cardiovascular: Negative for chest pain and palpitations.  Gastrointestinal: Negative for nausea, vomiting, abdominal pain and diarrhea.  Genitourinary: Positive for frequency. Negative for dysuria, hematuria, flank pain, decreased urine volume and difficulty urinating.  Skin: Negative for color change and rash.  Neurological: Negative for dizziness and weakness.     Allergies  Review of patient's allergies indicates no known allergies.  Home Medications   Current Outpatient Rx  Name Route Sig Dispense Refill  . CLONAZEPAM 2 MG PO TABS Oral Take 2 mg by mouth at bedtime as needed. aniety    . OMEGA-3 FATTY ACIDS 1000 MG PO CAPS Oral Take 1 g by mouth daily.    Marland Kitchen GEMFIBROZIL 600 MG PO TABS Oral Take 600 mg by mouth daily.    Marland Kitchen LISINOPRIL 10 MG PO TABS Oral Take 10 mg by mouth daily.    Marland Kitchen LITHIUM CARBONATE 300 MG PO CAPS Oral Take 1,200 mg by mouth at bedtime.    Marland Kitchen METFORMIN HCL 500 MG PO TABS Oral Take 1,500 mg by mouth daily.    . QUETIAPINE FUMARATE ER 300 MG PO TB24 Oral Take 600 mg by mouth at bedtime.    Marland Kitchen VITAMIN C 500 MG PO TABS Oral Take 500 mg by mouth daily.      BP 139/97  Pulse 101  Temp 98.6 F (37 C)  Resp 20  Ht 6\' 1"  (1.854 m)  Wt 278 lb (126.1 kg)  BMI 36.68 kg/m2  SpO2 95%  Physical Exam  Nursing note and vitals reviewed. Constitutional: He appears well-developed and well-nourished. No distress.  HENT:  Head: Normocephalic and atraumatic.  Eyes: Conjunctivae and EOM are normal. Pupils are equal, round, and reactive to light. Right eye exhibits no discharge. Left eye exhibits no discharge.  Fundoscopic exam:  The right eye shows no arteriolar narrowing, no AV nicking and no hemorrhage.       The left eye shows no arteriolar narrowing, no AV nicking and no hemorrhage.  Neck: Normal range of motion.  Cardiovascular: Normal rate, regular rhythm and normal heart sounds.   No murmur heard. Pulmonary/Chest: Effort normal and breath sounds normal.  Abdominal: Soft. There is no tenderness. There is no rebound and no guarding.  Musculoskeletal: He exhibits no edema.  Neurological: He is alert.  Skin: Skin is warm and dry. He is not diaphoretic.  Psychiatric: He has a normal mood and affect.    ED Course  Procedures (including critical care time)  Labs Reviewed  BASIC METABOLIC PANEL - Abnormal; Notable for the  following:    Sodium 131 (*)    Chloride 95 (*)    Glucose, Bld 486 (*)    All other components within normal limits  CBC - Abnormal; Notable for the following:    Platelets 118 (*)    All other components within normal limits  URINALYSIS, ROUTINE W REFLEX MICROSCOPIC - Abnormal; Notable for the following:    Glucose, UA >1000 (*)    All other components within normal limits  BLOOD GAS, VENOUS - Abnormal; Notable for the following:    pH, Ven 7.400 (*)    pO2, Ven 62.5 (*)    Bicarbonate 27.6 (*)    Acid-Base Excess 2.8 (*)    All other components within normal limits  GLUCOSE, CAPILLARY - Abnormal; Notable for the following:    Glucose-Capillary 319 (*)    All other components within normal limits  GLUCOSE, CAPILLARY - Abnormal; Notable for the following:    Glucose-Capillary 478 (*)    All other components within normal limits  DIFFERENTIAL  URINE MICROSCOPIC-ADD ON  LAB REPORT - SCANNED  POCT CBG MONITORING   No results found.   1. Diabetes mellitus       MDM  Pt with initial CBG of 478 with sx over the past several months of polydipsia and polyuria. He was admitted in Jan 2012 for DKA; an A1C done at that time was 11.2, which would indicate a mean glucose of 275. He has had no significant change in his DM therapy since that time. States that he regularly gets values in the high 300s when checking BG at home. He does not have an anion gap indicating DKA; he is mildly hyponatremic but when NA corrected for glucose, value is normal. We discussed that this is the likely etiology for his polydipsia/polyuria, and it is imperative that he get better control over his BGs. Emphasized the importance of making f/u with PCP to adjust DM regimen. Return precautions discussed.        Grant Fontana, [place] 04/11/11 1015

## 2011-04-10 NOTE — ED Notes (Signed)
Patient complains of higher than normal blood sugars frequent urination and dry mouth for 2 months. Today the urination and dry mouth worse.

## 2011-04-11 NOTE — ED Provider Notes (Signed)
Evaluation and management procedures were performed by the PA/NP under my supervision/collaboration.    Cassara Nida D Tyshae Stair, MD 04/11/11 1725 

## 2011-04-18 ENCOUNTER — Emergency Department (HOSPITAL_COMMUNITY)
Admission: EM | Admit: 2011-04-18 | Discharge: 2011-04-19 | Disposition: A | Discharge status: Home / Self Care | Payer: Self-pay | Attending: Emergency Medicine | Admitting: Emergency Medicine

## 2011-04-18 ENCOUNTER — Encounter (HOSPITAL_COMMUNITY): Payer: Self-pay | Admitting: Emergency Medicine

## 2011-04-18 DIAGNOSIS — E119 Type 2 diabetes mellitus without complications: Secondary | ICD-10-CM | POA: Insufficient documentation

## 2011-04-18 DIAGNOSIS — R739 Hyperglycemia, unspecified: Secondary | ICD-10-CM

## 2011-04-18 DIAGNOSIS — I10 Essential (primary) hypertension: Secondary | ICD-10-CM | POA: Insufficient documentation

## 2011-04-18 DIAGNOSIS — Z794 Long term (current) use of insulin: Secondary | ICD-10-CM | POA: Insufficient documentation

## 2011-04-18 LAB — URINE MICROSCOPIC-ADD ON

## 2011-04-18 LAB — URINALYSIS, ROUTINE W REFLEX MICROSCOPIC
Bilirubin Urine: NEGATIVE
Hgb urine dipstick: NEGATIVE
Nitrite: NEGATIVE
Protein, ur: NEGATIVE mg/dL
Specific Gravity, Urine: 1.024 (ref 1.005–1.030)
Urobilinogen, UA: 0.2 mg/dL (ref 0.0–1.0)

## 2011-04-18 LAB — GLUCOSE, CAPILLARY
Glucose-Capillary: 505 mg/dL — ABNORMAL HIGH (ref 70–99)
Glucose-Capillary: 477 mg/dL — ABNORMAL HIGH (ref 70–99)
Glucose-Capillary: 254 mg/dL — ABNORMAL HIGH (ref 70–99)
Glucose-Capillary: 287 mg/dL — ABNORMAL HIGH (ref 70–99)

## 2011-04-18 LAB — BASIC METABOLIC PANEL
BUN: 12 mg/dL (ref 6–23)
Calcium: 9.2 mg/dL (ref 8.4–10.5)
GFR calc Af Amer: >90 mL/min (ref >90)
GFR calc non Af Amer: >90 mL/min (ref >90)
Glucose, Bld: 503 mg/dL — ABNORMAL HIGH (ref 70–99)
Potassium: 3.7 mEq/L (ref 3.5–5.1)
Sodium: 132 mEq/L — ABNORMAL LOW (ref 135–145)

## 2011-04-18 LAB — CBC
MCH: 27.3 pg (ref 26.0–34.0)
MCHC: 34.4 g/dL (ref 30.0–36.0)
RDW: 13.1 % (ref 11.5–15.5)

## 2011-04-18 MED ORDER — INSULIN ASPART 100 UNIT/ML ~~LOC~~ SOLN
SUBCUTANEOUS | Status: AC
  Filled 2011-04-18: qty 1

## 2011-04-18 MED ORDER — INSULIN REGULAR BOLUS VIA INFUSION
0.0000 [IU] | Freq: Three times a day (TID) | INTRAVENOUS | Status: DC
  Filled 2011-04-18 (×3): qty 10

## 2011-04-18 MED ORDER — INSULIN ASPART 100 UNIT/ML ~~LOC~~ SOLN
10.0000 [IU] | Freq: Once | SUBCUTANEOUS | Status: AC
  Administered 2011-04-18: 10 [IU] via INTRAVENOUS

## 2011-04-18 MED ORDER — SODIUM CHLORIDE 0.9 % IV BOLUS (SEPSIS)
1000.0000 mL | Freq: Once | INTRAVENOUS | Status: AC
  Administered 2011-04-18: 1000 mL via INTRAVENOUS

## 2011-04-18 MED ORDER — INSULIN REGULAR HUMAN 100 UNIT/ML IJ SOLN: 10.0000 [IU] | Freq: Once | INTRAMUSCULAR | Status: DC

## 2011-04-18 MED ORDER — SODIUM CHLORIDE 0.9 % IV SOLN
INTRAVENOUS | Status: DC
  Administered 2011-04-18: 22:00:00 via INTRAVENOUS

## 2011-04-18 MED ORDER — INSULIN REGULAR BOLUS VIA INFUSION
10.0000 [IU] | Freq: Once | INTRAVENOUS | Status: DC
  Filled 2011-04-18: qty 10

## 2011-04-18 MED ORDER — SODIUM CHLORIDE 0.9 % IV SOLN
INTRAVENOUS | Status: DC
  Administered 2011-04-18: 4.4 [IU]/h via INTRAVENOUS
  Filled 2011-04-18: qty 1

## 2011-04-18 NOTE — ED Notes (Signed)
Insulin infusion increased to 4.5 units/hour.

## 2011-04-18 NOTE — ED Notes (Signed)
Patient reports recent stress from being unemployed and living with his parents.  Reports his diet has not changed and has not had a recent illness.  No pain reported.  Patient reports he cannot get his blood sugar down even when he takes his insulin.

## 2011-04-18 NOTE — ED Notes (Signed)
Blood glucose 254.  Decreased insulin drip to 1.9 units per hour.

## 2011-04-18 NOTE — ED Notes (Signed)
Pt states he has high blood sugar  Pt states he came in last week and it was in the 400s and tonight it is in the 600s  Pt states he is feeling ok  No sxs to report

## 2011-04-19 LAB — GLUCOSE, CAPILLARY: Glucose-Capillary: 222 mg/dL — ABNORMAL HIGH (ref 70–99)

## 2011-04-19 MED ORDER — INSULIN GLARGINE 100 UNIT/ML ~~LOC~~ SOLN: 10.0000 [IU] | Freq: Every day | SUBCUTANEOUS | Status: DC

## 2011-04-19 NOTE — ED Notes (Signed)
Insulin and NS infusions stopped.

## 2011-04-19 NOTE — ED Provider Notes (Signed)
History     CSN: [id number]  Arrival date & time 04/18/11  4098   First MD Initiated Contact with Patient 04/18/11 1956      Chief Complaint  Patient presents with  . Hyperglycemia    (Consider location/radiation/quality/duration/timing/severity/associated sxs/prior treatment) HPI Patient presents with complaint of elevated blood sugar. He has a history of diabetes and takes both metformin and Lantus insulin. Patient states that he has had difficulty controlling his blood sugar despite taking his Lantus. He states that he has not had any daily dosing of his Lantus that has been prescribed to him. Instead he has taken 10-15 units per day for the last several days. He does have some frequency of urination but denies fevers, abdominal pain, vomiting. He states that he has been taking his metformin 3 times daily and has not missed any doses. He was seen in the ED last week for high blood sugar and was treated but has continued to take his Lantus on a sliding scale basis rather than daily dosing. His symptoms are continuous. There no other alleviating or modifying factors. There no other associated systemic symptoms.  Past Medical History  Diagnosis Date  . Diabetes mellitus   . Hypertension     Past Surgical History  Procedure Date  . Knee surgery     Family History  Problem Relation Age of Onset  . Diabetes Other   . Hypertension Other   . Heart attack Other     History  Substance Use Topics  . Smoking status: Never Smoker   . Smokeless tobacco: Not on file  . Alcohol Use: No      Review of Systems ROS reviewed and otherwise negative except for mentioned in HPI  Allergies  Review of patient's allergies indicates no known allergies.  Home Medications   Current Outpatient Rx  Name Route Sig Dispense Refill  . CLONAZEPAM 2 MG PO TABS Oral Take 2 mg by mouth at bedtime as needed. aniety    . OMEGA-3 FATTY ACIDS 1000 MG PO CAPS Oral Take 1 g by mouth daily.    Marland Kitchen  GEMFIBROZIL 600 MG PO TABS Oral Take 600 mg by mouth daily.    . INSULIN GLARGINE 100 UNIT/ML [place] SOLN Subcutaneous Inject 15-20 Units into the skin 3 (three) times daily. Per sliding scale    . LISINOPRIL 10 MG PO TABS Oral Take 10 mg by mouth daily.    Marland Kitchen LITHIUM CARBONATE 300 MG PO CAPS Oral Take 1,200 mg by mouth at bedtime.    Marland Kitchen METFORMIN HCL 500 MG PO TABS Oral Take 1,500 mg by mouth daily.    . QUETIAPINE FUMARATE ER 300 MG PO TB24 Oral Take 600 mg by mouth at bedtime.    Marland Kitchen VITAMIN C 500 MG PO TABS Oral Take 500 mg by mouth daily.    . INSULIN GLARGINE 100 UNIT/ML [place] SOLN Subcutaneous Inject 10 Units into the skin at bedtime. 10 mL 12    BP 151/79  Pulse 84  Temp(Src) 97.9 F (36.6 C) (Oral)  Resp 20  SpO2 96% Vitals reviewed Physical Exam Physical Examination: General appearance - alert, well appearing, and in no distress Mental status - alert, oriented to person, place, and time Eyes - pupils equal and reactive, no scleral icterus Mouth - mucous membranes moist, pharynx normal without lesions Chest - clear to auscultation, no wheezes, rales or rhonchi, symmetric air entry Heart - normal rate, regular rhythm, normal S1, S2, no murmurs, rubs, clicks or gallops  Abdomen - soft, nontender, nondistended, no masses or organomegaly Musculoskeletal - no joint tenderness, deformity or swelling Extremities - peripheral pulses normal, no pedal edema, no clubbing or cyanosis Skin - normal coloration and turgor, no rashes  ED Course  Procedures (including critical care time)  Labs Reviewed  GLUCOSE, CAPILLARY - Abnormal; Notable for the following:    Glucose-Capillary 505 (*)    All other components within normal limits  GLUCOSE, CAPILLARY - Abnormal; Notable for the following:    Glucose-Capillary 477 (*)    All other components within normal limits  CBC - Abnormal; Notable for the following:    Platelets 120 (*)    All other components within normal limits  BASIC METABOLIC PANEL  - Abnormal; Notable for the following:    Sodium 132 (*)    Glucose, Bld 503 (*)    All other components within normal limits  URINALYSIS, ROUTINE W REFLEX MICROSCOPIC - Abnormal; Notable for the following:    Glucose, UA >1000 (*)    All other components within normal limits  GLUCOSE, CAPILLARY - Abnormal; Notable for the following:    Glucose-Capillary 464 (*)    All other components within normal limits  GLUCOSE, CAPILLARY - Abnormal; Notable for the following:    Glucose-Capillary 254 (*)    All other components within normal limits  GLUCOSE, CAPILLARY - Abnormal; Notable for the following:    Glucose-Capillary 287 (*)    All other components within normal limits  GLUCOSE, CAPILLARY - Abnormal; Notable for the following:    Glucose-Capillary 222 (*)    All other components within normal limits  URINE MICROSCOPIC-ADD ON  LAB REPORT - SCANNED  POCT CBG MONITORING  POCT CBG MONITORING  POCT CBG MONITORING   No results found.   1. Hyperglycemia   2. Diabetes mellitus       MDM  Patient with history of diabetes presenting with hyperglycemia. His blood sugar has been controlled with IV insulin. He is no signs either on physical oral laboratory evaluation to suggest DKA. He is nontoxic and well-hydrated in appearance. Upon review of his chart he was prescribed to initially take 10 units daily of his Lantus. Patient states that he was not prescribed this and was to use it on a sliding scale basis. I have discussed with patient that it is important that he take his Lantus daily and then supplement with sliding scale of short acting insulin. Patient has had diabetic teaching at a prior admission and these instructions were reinforced with him today. He verbalizes understanding. He was also strongly encouraged to arrange for outpatient followup with his physician because insulin dosing may need to be adjusted. He was instructed to continue taking his metformin. He was discharged with  strict return precautions and is agreeable with this plan.        Ethelda Chick, MD 04/20/11 [phone]

## 2011-04-19 NOTE — ED Notes (Signed)
Insulin infusion increased to 4.9 units/hour

## 2011-04-19 NOTE — ED Notes (Signed)
Pt fed

## 2011-10-27 ENCOUNTER — Other Ambulatory Visit: Payer: Self-pay | Admitting: Family Medicine

## 2011-10-27 ENCOUNTER — Ambulatory Visit
Admission: RE | Admit: 2011-10-27 | Discharge: 2011-10-27 | Disposition: A | Discharge status: Home / Self Care | Payer: Medicaid Other | Source: Ambulatory Visit | Attending: Family Medicine | Admitting: Family Medicine

## 2011-10-27 DIAGNOSIS — N50819 Testicular pain, unspecified: Secondary | ICD-10-CM

## 2011-10-27 IMAGING — US US SCROTUM
1 series · 13 of 25 positions shown · non-contrast
Comparison: None

CLINICAL DATA: Right testicular pain and swelling for 2 months

SCROTAL ULTRASOUND
DOPPLER ULTRASOUND OF THE TESTICLES
TECHNIQUE: Complete ultrasound examination of the testicles,
epididymis, and other scrotal structures was performed.  Color and
spectral Doppler ultrasound were also utilized to evaluate blood
flow to the testicles.

[Series 1: us scrotum · 0.11mm/px · 13 of 43 slices shown]
[im 1/43]
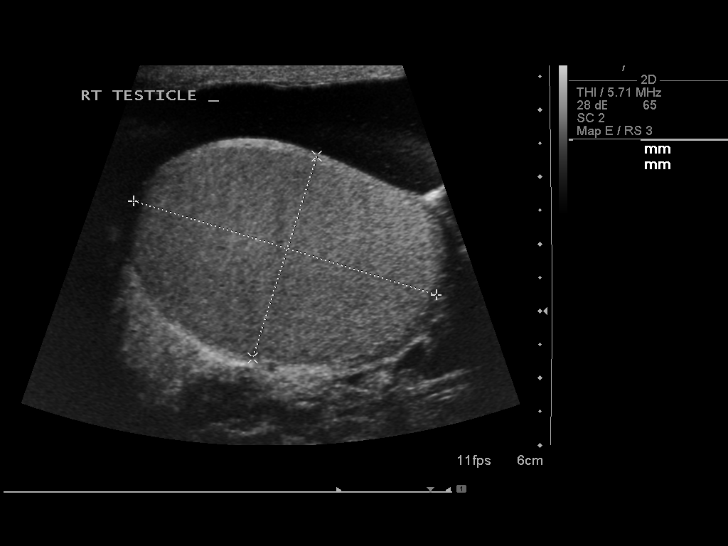
[im 4/43]
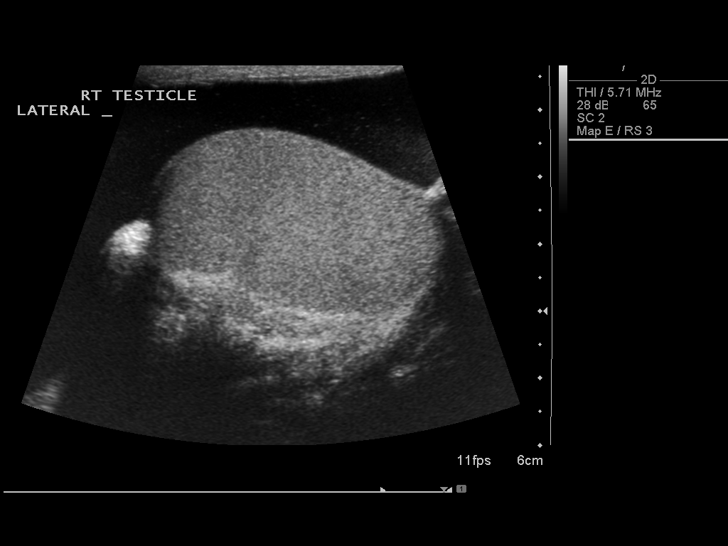
[im 8/43]
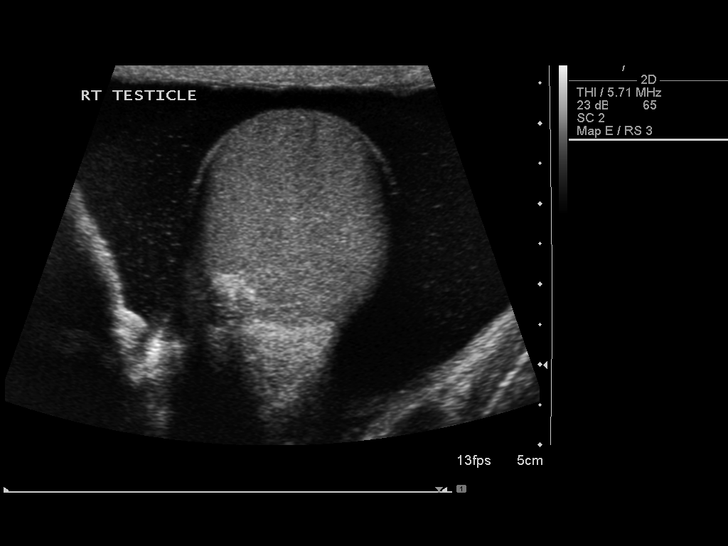
[im 11/43]
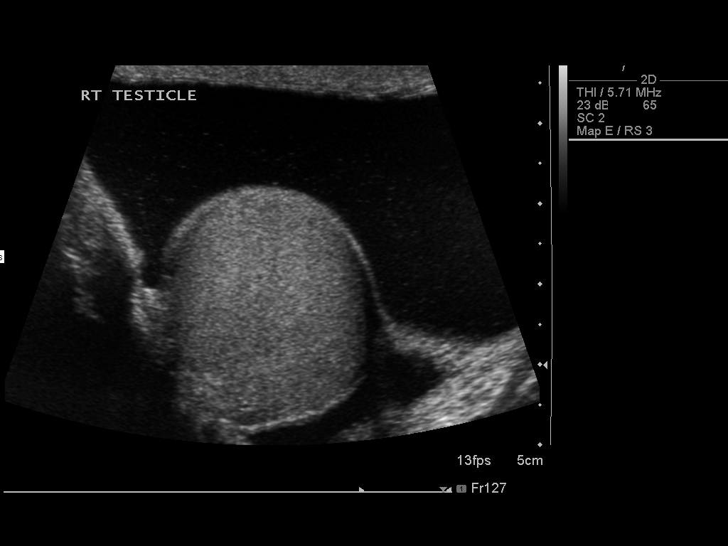
[im 15/43]
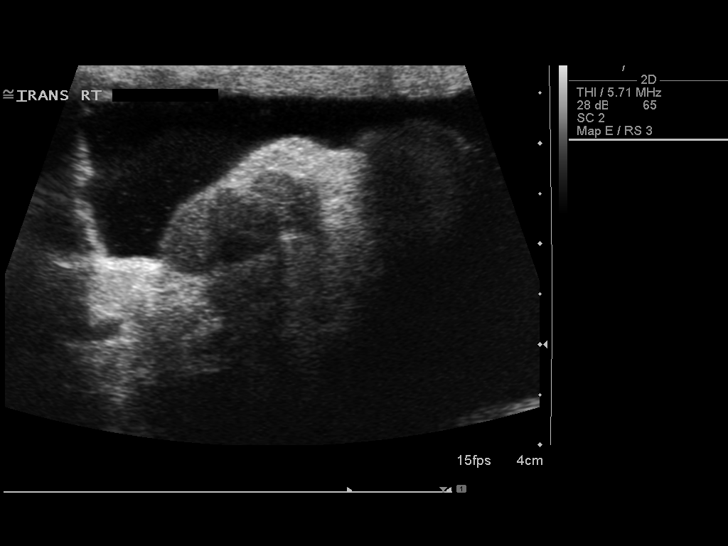
[im 18/43]
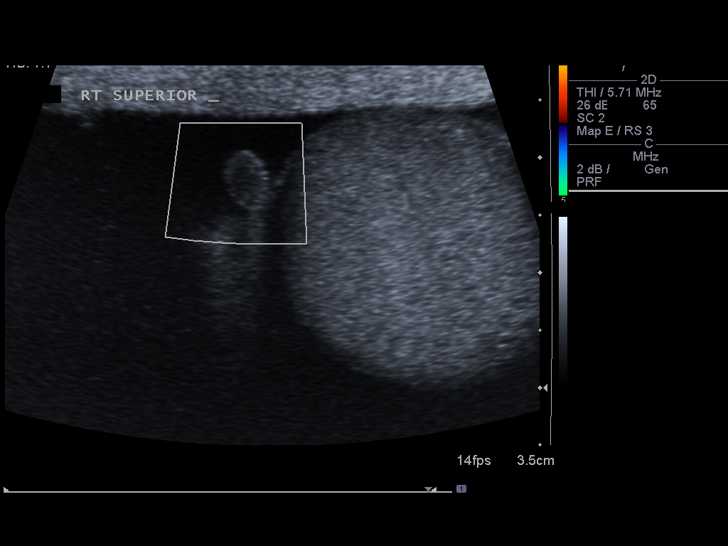
[im 22/43]
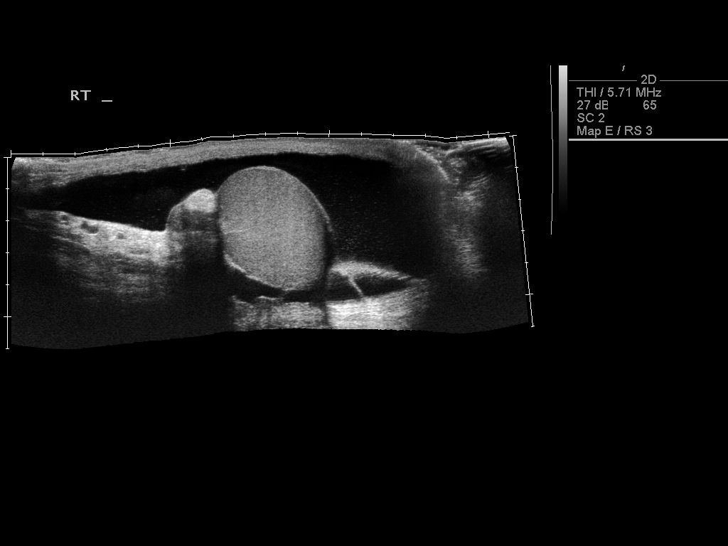
[im 25/43]
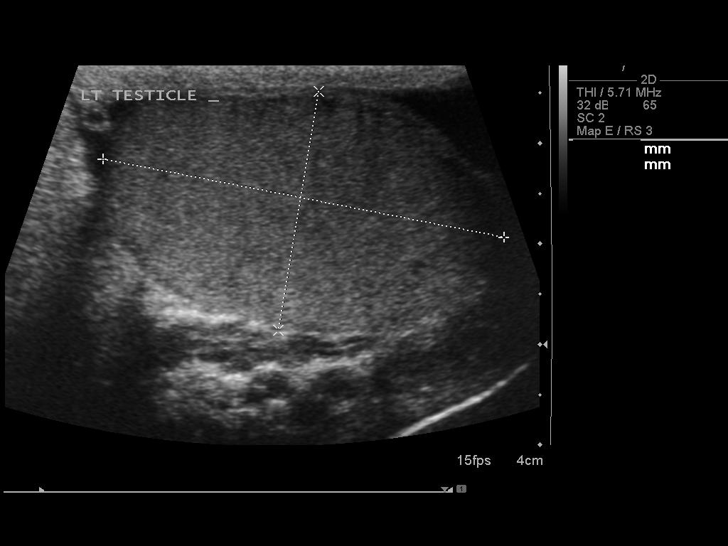
[im 29/43]
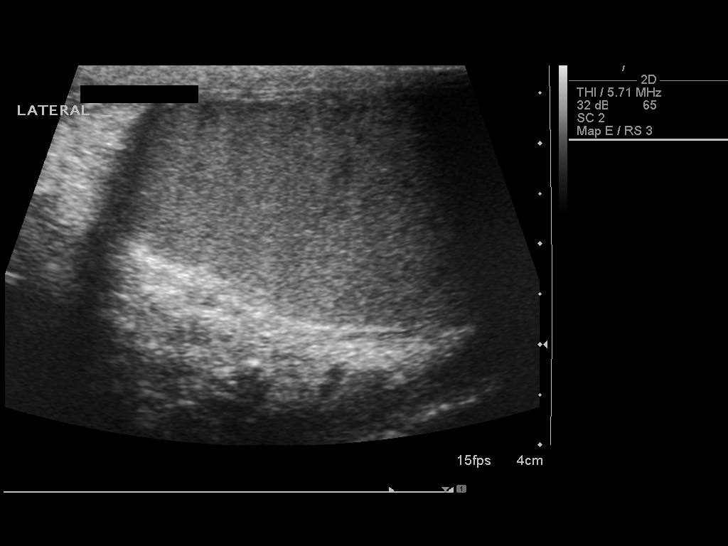
[im 32/43]
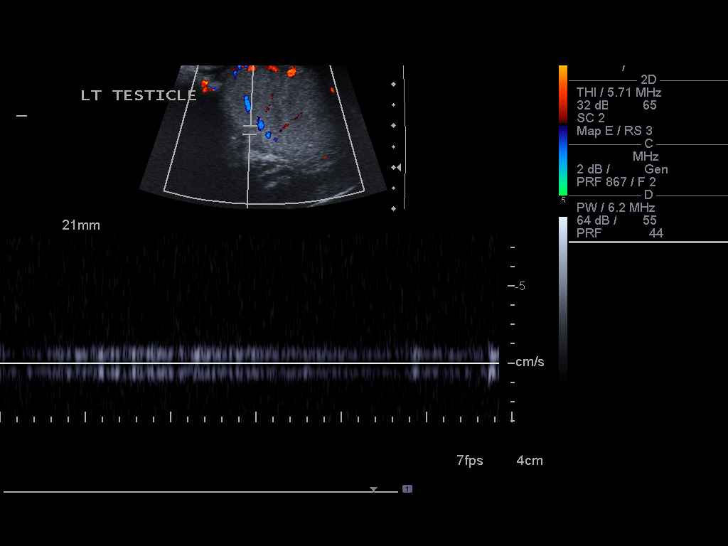
[im 36/43]
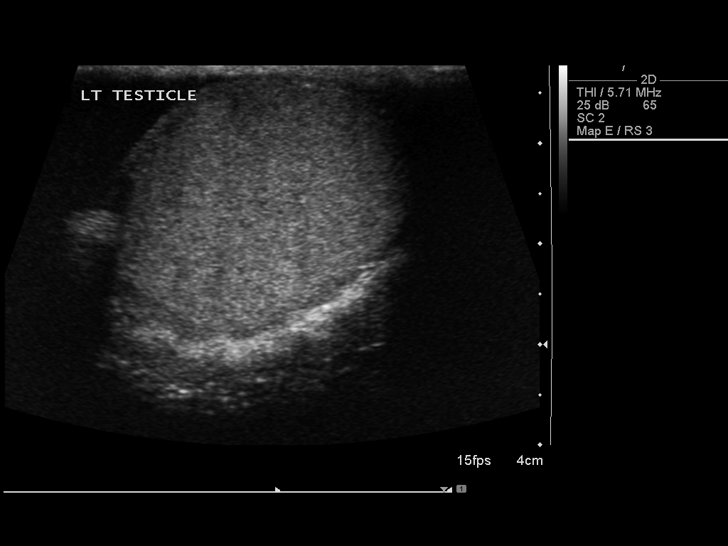
[im 39/43]
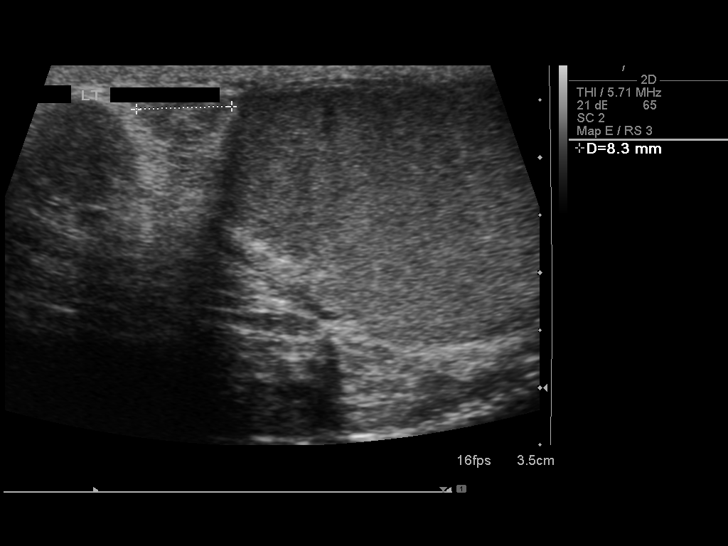
[im 43/43]
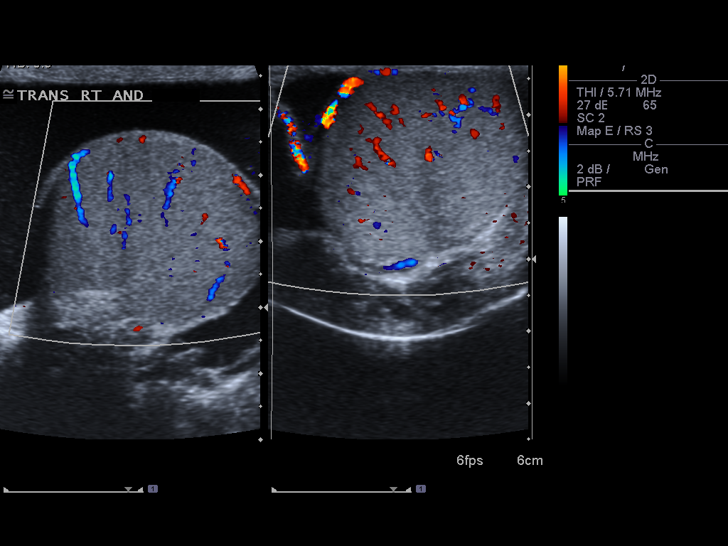

[13 of 25 positions shown; findings below may reference images not displayed]

FINDINGS: Right testis:  The right testicle is normal in size and
echogenicity.  No intratesticular abnormality is seen.  Blood flow
is noted to the right testicle with arterial and venous waveforms.

Left testis:  The left testicle also is normal in size.  No
intratesticular abnormality is seen.  Blood flow is demonstrated
with arterial and venous waveforms.

Right epididymis:  The right epididymis is unremarkable.

Left epididymis:  The left epididymis is unremarkable.

Hydrocele:  There is a complex right hydrocele present with
septations and debris displacing the left testicle inferiorly and
laterally.

Varicocele:  No varicocele is seen.

Pulsed Doppler interrogation of both testes demonstrates low
resistance flow bilaterally.
IMPRESSION: 1.  Large right complex hydrocele with septations.
2.  No intratesticular abnormality.  Blood flow is demonstrated to
both testicles.

## 2013-02-05 ENCOUNTER — Emergency Department (HOSPITAL_COMMUNITY)
Admission: EM | Admit: 2013-02-05 | Discharge: 2013-02-05 | Disposition: A | Discharge status: Home / Self Care | Payer: BC Managed Care – PPO | Attending: Emergency Medicine | Admitting: Emergency Medicine

## 2013-02-05 ENCOUNTER — Emergency Department (HOSPITAL_COMMUNITY): Payer: BC Managed Care – PPO

## 2013-02-05 DIAGNOSIS — S8992XA Unspecified injury of left lower leg, initial encounter: Secondary | ICD-10-CM

## 2013-02-05 DIAGNOSIS — I10 Essential (primary) hypertension: Secondary | ICD-10-CM | POA: Insufficient documentation

## 2013-02-05 DIAGNOSIS — X500XXA Overexertion from strenuous movement or load, initial encounter: Secondary | ICD-10-CM | POA: Insufficient documentation

## 2013-02-05 DIAGNOSIS — E119 Type 2 diabetes mellitus without complications: Secondary | ICD-10-CM | POA: Insufficient documentation

## 2013-02-05 DIAGNOSIS — Z79899 Other long term (current) drug therapy: Secondary | ICD-10-CM | POA: Insufficient documentation

## 2013-02-05 DIAGNOSIS — Y9301 Activity, walking, marching and hiking: Secondary | ICD-10-CM | POA: Insufficient documentation

## 2013-02-05 DIAGNOSIS — S8990XA Unspecified injury of unspecified lower leg, initial encounter: Secondary | ICD-10-CM | POA: Insufficient documentation

## 2013-02-05 DIAGNOSIS — S99919A Unspecified injury of unspecified ankle, initial encounter: Secondary | ICD-10-CM | POA: Insufficient documentation

## 2013-02-05 DIAGNOSIS — Y92009 Unspecified place in unspecified non-institutional (private) residence as the place of occurrence of the external cause: Secondary | ICD-10-CM | POA: Insufficient documentation

## 2013-02-05 DIAGNOSIS — Z794 Long term (current) use of insulin: Secondary | ICD-10-CM | POA: Insufficient documentation

## 2013-02-05 IMAGING — CR DG KNEE COMPLETE 4+V*L*
4 series · 4 of 4 positions shown · non-contrast
Comparison: None.

CLINICAL DATA: Chronic left knee pain

EXAM:
LEFT KNEE - COMPLETE 4+ VIEW

[t knee ap left]
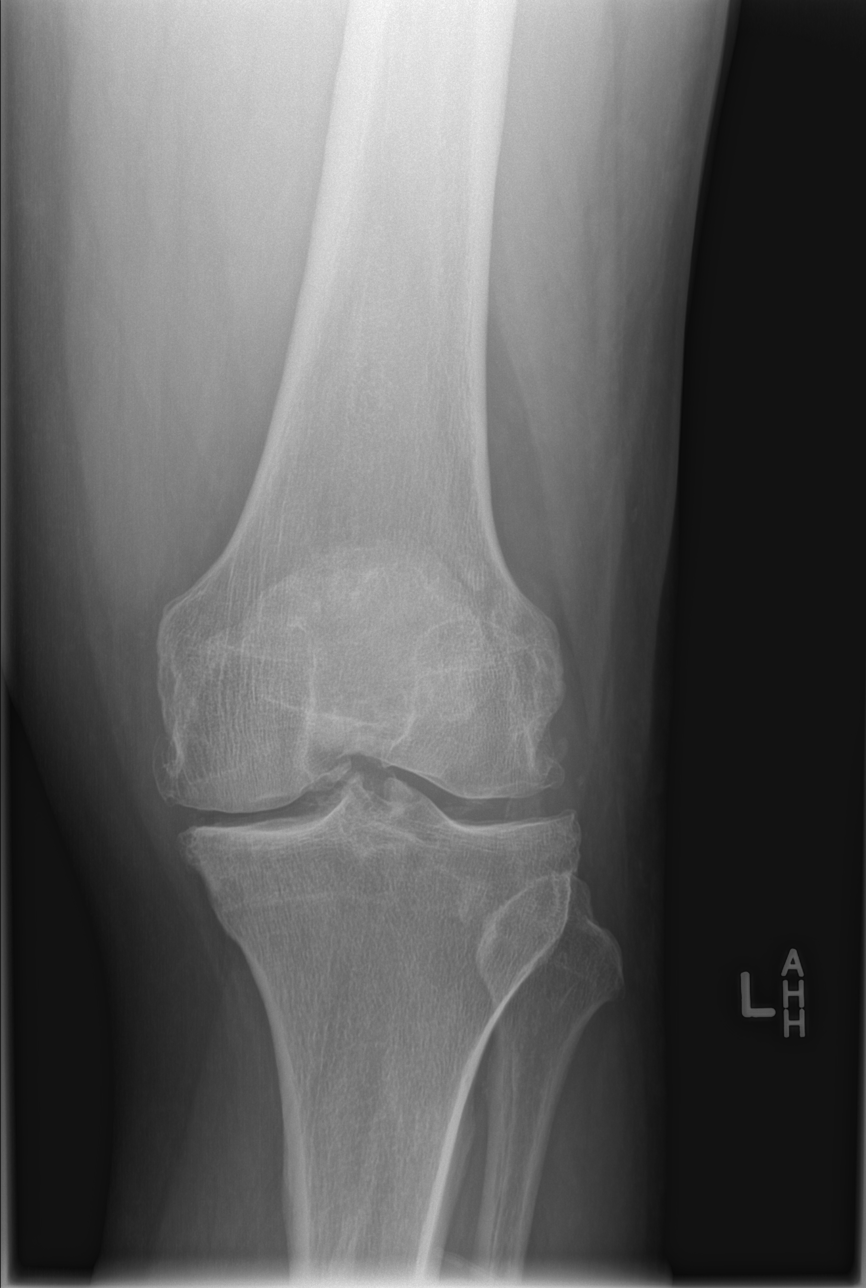

[t knee obl left (1 of 2)]
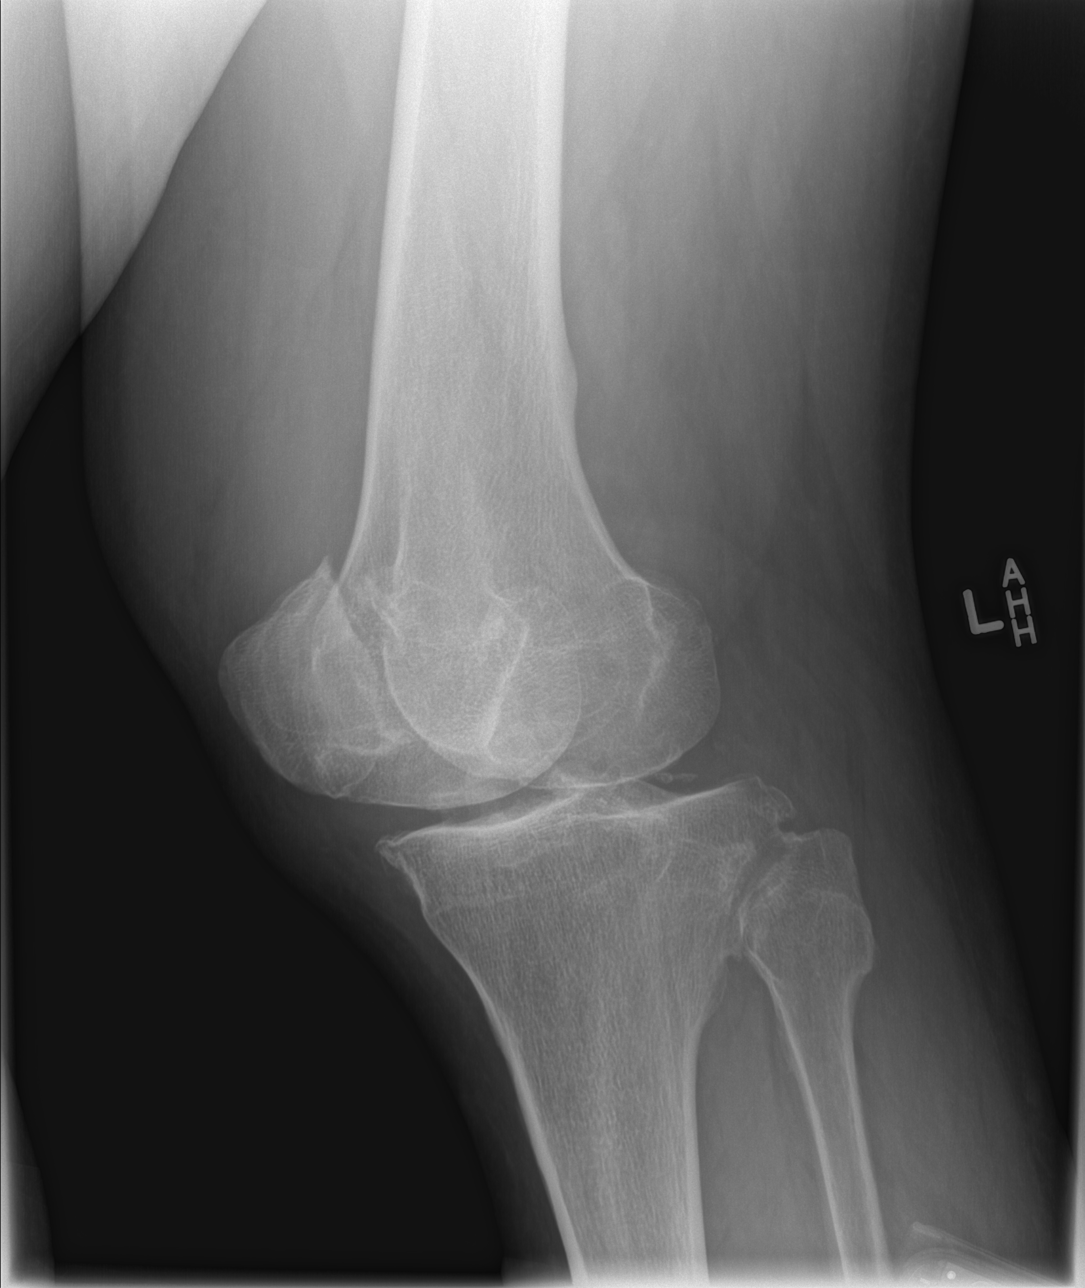

[t knee obl left (2 of 2)]
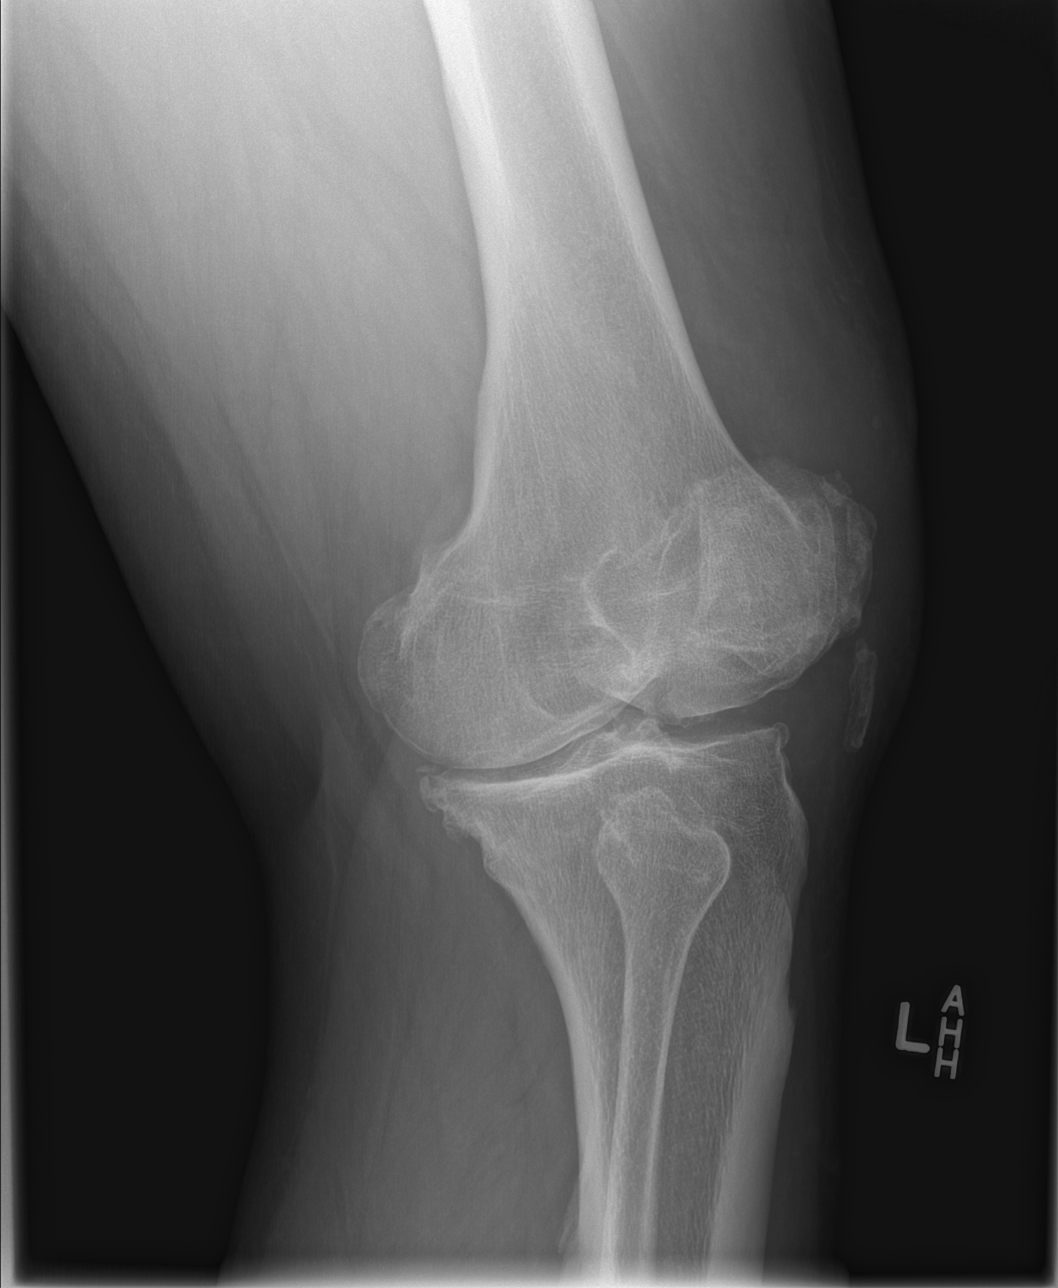

[t knee lat left]
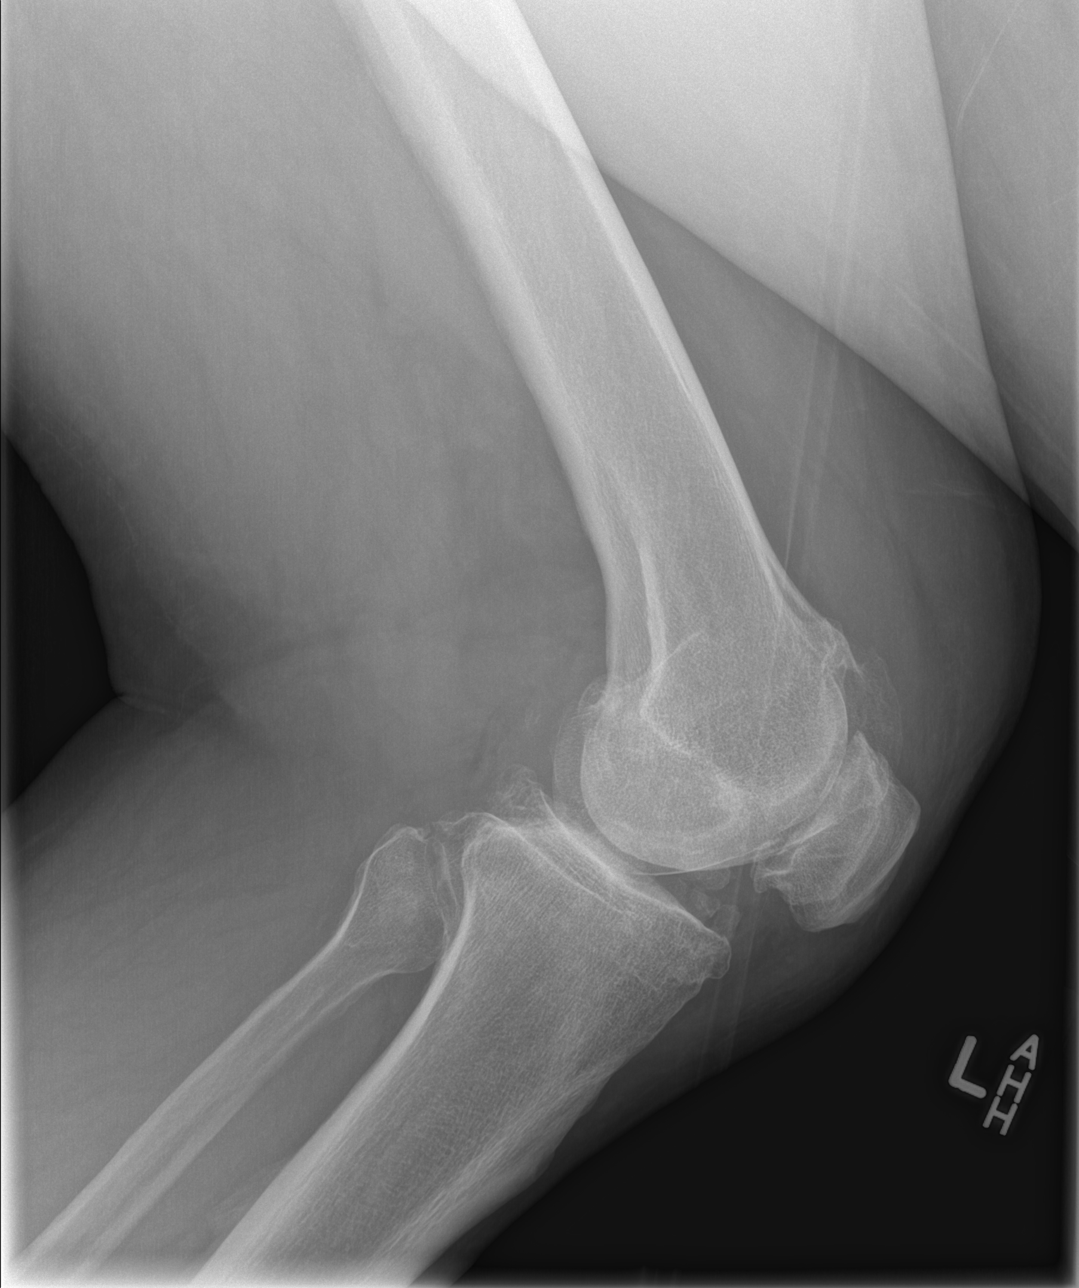

[4 of 4 positions shown; findings below may reference images not displayed]

FINDINGS: Four views of left knee submitted. There is narrowing of medial
joint compartment. No acute fracture or subluxation. Mild
chondrocalcinosis. Mild spurring of tibial plateau. Spurring of
medial and lateral femoral condyle. Significant narrowing of
patellofemoral joint space. Mild spurring of patella. Small joint
effusion. Diffuse osteopenia.
IMPRESSION: No acute fracture or subluxation. Osteoarthritic changes as
described above. Mild chondrocalcinosis.

## 2013-02-05 MED ORDER — HYDROCODONE-ACETAMINOPHEN 5-325 MG PO TABS: 1.0000 | ORAL_TABLET | Freq: Four times a day (QID) | ORAL | Status: DC | PRN

## 2013-02-05 NOTE — ED Notes (Signed)
Pt c/o L knee pain. States he was walking today and felt his L knee "pop". Pt states he has had prior surgery on L knee. Pt states he has some swelling to his knee now. Pt ambulatory to exam with steady gait.

## 2013-02-05 NOTE — ED Provider Notes (Signed)
CSN: [id number]     Arrival date & time 02/05/13  1815 History   First MD Initiated Contact with Patient 02/05/13 1821    This chart was scribed for Ebbie Ridge PA-C, working with Audree Camel, MD by Lewanda Rife, ED Scribe. This patient was seen in room WTR8/WTR8 and the patient's care was started at 6:33 PM     No chief complaint on file.  (Consider location/radiation/quality/duration/timing/severity/associated sxs/prior Treatment) The history is provided by the patient. No language interpreter was used.   HPI Comments: Matthew Barnes is a 57 y.o. male who presents to the Emergency Department with PMHx of bilateral knee arthroscopy complaining of constant moderate left knee pain onset acute earlier this morning. Reports feeling a "pop" on left knee while walking out of his house. Reports pain is exacerbated by weight bearing and walking. Denies any alleviating factors. Denies associated fall, injury, and fever. Denies hx of similar pain in the past.    Past Medical History  Diagnosis Date  . Diabetes mellitus   . Hypertension    Past Surgical History  Procedure Laterality Date  . Knee surgery     Family History  Problem Relation Age of Onset  . Diabetes Other   . Hypertension Other   . Heart attack Other    History  Substance Use Topics  . Smoking status: Never Smoker   . Smokeless tobacco: Not on file  . Alcohol Use: No    Review of Systems  Constitutional: Negative for fever.  Musculoskeletal: Positive for arthralgias.  Skin: Negative for wound.  All other systems reviewed and are negative.  A complete 10 system review of systems was obtained and all systems are negative except as noted in the HPI and PMHx.    Allergies  Review of patient's allergies indicates no known allergies.  Home Medications   Current Outpatient Rx  Name  Route  Sig  Dispense  Refill  . clonazePAM (KLONOPIN) 2 MG tablet   Oral   Take 2 mg by mouth at bedtime as needed. aniety          . fish oil-omega-3 fatty acids 1000 MG capsule   Oral   Take 1 g by mouth daily.         Marland Kitchen gemfibrozil (LOPID) 600 MG tablet   Oral   Take 600 mg by mouth daily.         . insulin glargine (LANTUS) 100 UNIT/ML injection   Subcutaneous   Inject 15-20 Units into the skin 3 (three) times daily. Per sliding scale         . EXPIRED: insulin glargine (LANTUS) 100 UNIT/ML injection   Subcutaneous   Inject 10 Units into the skin at bedtime.   10 mL   12   . lisinopril (PRINIVIL,ZESTRIL) 10 MG tablet   Oral   Take 10 mg by mouth daily.         Marland Kitchen lithium carbonate 300 MG capsule   Oral   Take 1,200 mg by mouth at bedtime.         . metFORMIN (GLUCOPHAGE) 500 MG tablet   Oral   Take 1,500 mg by mouth daily.         . QUEtiapine (SEROQUEL XR) 300 MG 24 hr tablet   Oral   Take 600 mg by mouth at bedtime.         . vitamin C (ASCORBIC ACID) 500 MG tablet   Oral   Take 500 mg by  mouth daily.          There were no vitals taken for this visit. Physical Exam  Nursing note and vitals reviewed. Constitutional: He is oriented to person, place, and time. He appears well-developed and well-nourished. No distress.  HENT:  Head: Normocephalic and atraumatic.  Eyes: EOM are normal.  Neck: Neck supple. No tracheal deviation present.  Cardiovascular: Normal rate, intact distal pulses and normal pulses.   Pulses:      Popliteal pulses are 2+ on the left side.       Dorsalis pedis pulses are 2+ on the left side.       Posterior tibial pulses are 2+ on the left side.  Pulmonary/Chest: Effort normal. No respiratory distress.  Musculoskeletal: Normal range of motion.  TTP of anterior and posterior left knee. No swelling or effusion. No warmth to touch or erythema.  Neurological: He is alert and oriented to person, place, and time.  Skin: Skin is warm and dry.  Psychiatric: He has a normal mood and affect. His behavior is normal.    ED Course   Procedures COORDINATION OF CARE:  Nursing notes reviewed. Vital signs reviewed. Initial pt interview and examination performed.   6:23 PM-Discussed work up plan with pt at bedside, which includes left knee x-ray. Pt agrees with plan.   Treatment plan initiated:Medications - No data to display  Patient will be treated for his discomfort and referred back to his orthopedic doctor.  Patient is advised to return here as needed.  Told ice and elevate his knee   I personally performed the services described in this documentation, which was scribed in my presence. The recorded information has been reviewed and is accurate.  Carlyle Dolly, PA-C 02/05/13 1915  Carlyle Dolly, PA-C 02/05/13 1927

## 2013-02-06 NOTE — ED Provider Notes (Signed)
Medical screening examination/treatment/procedure(s) were performed by non-physician practitioner and as supervising physician I was immediately available for consultation/collaboration.  EKG Interpretation   None         Audree Camel, MD 02/06/13 [phone]

## 2013-04-18 ENCOUNTER — Ambulatory Visit: Payer: BC Managed Care – PPO | Admitting: Family Medicine

## 2013-04-29 LAB — HM DIABETES EYE EXAM

## 2013-05-03 ENCOUNTER — Encounter (INDEPENDENT_AMBULATORY_CARE_PROVIDER_SITE_OTHER): Payer: Self-pay

## 2013-05-03 ENCOUNTER — Ambulatory Visit (INDEPENDENT_AMBULATORY_CARE_PROVIDER_SITE_OTHER): Payer: BC Managed Care – PPO | Admitting: Family Medicine

## 2013-05-03 ENCOUNTER — Encounter: Payer: Self-pay | Admitting: Family Medicine

## 2013-05-03 VITALS — BP 144/82 | Temp 97.9°F | Ht 71.0 in | Wt 268.0 lb

## 2013-05-03 DIAGNOSIS — E1165 Type 2 diabetes mellitus with hyperglycemia: Principal | ICD-10-CM

## 2013-05-03 DIAGNOSIS — I152 Hypertension secondary to endocrine disorders: Secondary | ICD-10-CM | POA: Insufficient documentation

## 2013-05-03 DIAGNOSIS — F3181 Bipolar II disorder: Secondary | ICD-10-CM

## 2013-05-03 DIAGNOSIS — F3189 Other bipolar disorder: Secondary | ICD-10-CM

## 2013-05-03 DIAGNOSIS — E039 Hypothyroidism, unspecified: Secondary | ICD-10-CM

## 2013-05-03 DIAGNOSIS — I10 Essential (primary) hypertension: Secondary | ICD-10-CM

## 2013-05-03 DIAGNOSIS — E1159 Type 2 diabetes mellitus with other circulatory complications: Secondary | ICD-10-CM | POA: Insufficient documentation

## 2013-05-03 DIAGNOSIS — E1149 Type 2 diabetes mellitus with other diabetic neurological complication: Secondary | ICD-10-CM

## 2013-05-03 DIAGNOSIS — E785 Hyperlipidemia, unspecified: Secondary | ICD-10-CM

## 2013-05-03 DIAGNOSIS — IMO0002 Reserved for concepts with insufficient information to code with codable children: Secondary | ICD-10-CM

## 2013-05-03 HISTORY — DX: Hypothyroidism, unspecified: E03.9

## 2013-05-03 HISTORY — DX: Bipolar II disorder: F31.81

## 2013-05-03 LAB — BASIC METABOLIC PANEL
BUN: 11 mg/dL (ref 6–23)
CO2: 27 meq/L (ref 19–32)
Calcium: 9.3 mg/dL (ref 8.4–10.5)
Chloride: 98 mEq/L (ref 96–112)
Creatinine, Ser: 0.8 mg/dL (ref 0.4–1.5)
GFR: 100.05 mL/min (ref >60.00)
GLUCOSE: 404 mg/dL — AB (ref 70–99)
POTASSIUM: 3.7 meq/L (ref 3.5–5.1)
Sodium: 132 mEq/L — ABNORMAL LOW (ref 135–145)

## 2013-05-03 LAB — MICROALBUMIN / CREATININE URINE RATIO
Creatinine,U: 26.5 mg/dL
MICROALB/CREAT RATIO: 7.6 mg/g (ref 0.0–30.0)
Microalb, Ur: 2 mg/dL — ABNORMAL HIGH (ref 0.0–1.9)

## 2013-05-03 LAB — LIPID PANEL
CHOL/HDL RATIO: 5
CHOLESTEROL: 115 mg/dL (ref 0–200)
HDL: 25.4 mg/dL — AB (ref >39.00)
VLDL: 133.4 mg/dL — AB (ref 0.0–40.0)

## 2013-05-03 LAB — T4, FREE: Free T4: 0.93 ng/dL (ref 0.60–1.60)

## 2013-05-03 LAB — TSH: TSH: 1.02 u[IU]/mL (ref 0.35–5.50)

## 2013-05-03 LAB — HEMOGLOBIN A1C: Hgb A1c MFr Bld: 11.9 % — ABNORMAL HIGH (ref 4.6–6.5)

## 2013-05-03 NOTE — Progress Notes (Signed)
Pre visit review using our clinic review tool, if applicable. No additional management support is needed unless otherwise documented below in the visit note. 

## 2013-05-03 NOTE — Progress Notes (Signed)
. Chief Complaint  Patient presents with  . Establish Care    HPI:  Matthew Barnes is here to establish care. Used to see Yehuda Savannah at [place]. Had issues with office never calling him back. Last PCP and physical: reports had not had physical in a long time, reports last visit about dec 2014  Has the following chronic problems and concerns today:  Patient Active Problem List   Diagnosis Date Noted  . Type II diabetes mellitus with neurological manifestations, uncontrolled 05/03/2013  . Essential hypertension, benign 05/03/2013  . Unspecified hypothyroidism 05/03/2013  . Bipolar II disorder 05/03/2013  . Hyperlipidemia 05/03/2013   DM: per [facility] uncontrolled for > 2 years with BG in 200-500range on labs in chart in 2013 -reports recently increased thirst and urination -reports on 80 units of lantus insulin daily in the past but reports diabetes was never controlled -reports ran out of money and stopped the insulin 2 months ago and has not seen anyone -reports was frustrated with not getting good results with the insulin and cost -reports does not check his BS at home -has joined a gym but has not been exercising -has never seen a diabetes educator -some peripheral neuropathy -sees optho and podiatry yearly  HTN/HLD: -on losartan for this needs refilled -denies: CP, SOb, swelling, HAs -reports never took a sttain and doesn't know why, denies  Hypothyroidism: -reports has been on same synthroid similar dose for a long time  Bipolar Disorder: -Dr. Carlean Jews, Psych -on lithium, seroquel, zoloft -hx of remote hospitalization for suicide attempt in the past  Knee arthritis: -sees Dr. Alvan Dame for this and on disability   Health Maintenance: -refuses colon cancer screening - reports he would rather die and not know -refuses pneumonia and flu vaccines  ROS: See pertinent positives and negatives per HPI.  Past Medical History  Diagnosis Date  .  Diabetes mellitus   . Hypertension   . Hyperlipidemia     Family History  Problem Relation Age of Onset  . Diabetes Mother   . Hypertension Mother   . Heart disease Father   . Heart attack Father 39    History   Social History  . Marital Status: Single    Spouse Name: N/A    Number of Children: N/A  . Years of Education: N/A   Social History Main Topics  . Smoking status: Never Smoker   . Smokeless tobacco: None  . Alcohol Use: No  . Drug Use: No  . Sexual Activity: No   Other Topics Concern  . None   Social History Narrative   Work or School: on disability for knee arthritis      Home Situation: lives alone      Spiritual Beliefs:Christian      Lifestyle:no regular exercising; diet not great             Current outpatient prescriptions:gemfibrozil (LOPID) 600 MG tablet, Take 600 mg by mouth daily., Disp: , Rfl: ;  levothyroxine (SYNTHROID, LEVOTHROID) 25 MCG tablet, , Disp: , Rfl: ;  lithium carbonate 300 MG capsule, Take 1,200 mg by mouth at bedtime., Disp: , Rfl: ;  losartan (COZAAR) 100 MG tablet, Take 100 mg by mouth daily. , Disp: , Rfl: ;  metFORMIN (GLUCOPHAGE) 500 MG tablet, Take 1,500 mg by mouth daily., Disp: , Rfl:  QUEtiapine (SEROQUEL XR) 300 MG 24 hr tablet, Take 75 mg by mouth at bedtime. , Disp: , Rfl: ;  sertraline (ZOLOFT) 100 MG  tablet, , Disp: , Rfl:   EXAM:  Filed Vitals:   05/03/13 1425  BP: 144/82  Temp: 97.9 F (36.6 C)    Body mass index is 37.39 kg/(m^2).  GENERAL: vitals reviewed and listed above, alert, oriented, appears well hydrated and in no acute distress  HEENT: atraumatic, conjunttiva clear, no obvious abnormalities on inspection of external nose and ears  NECK: no obvious masses on inspection  LUNGS: clear to auscultation bilaterally, no wheezes, rales or rhonchi, good air movement  CV: HRRR, no peripheral edema  MS: moves all extremities without noticeable abnormality  PSYCH: pleasant and cooperative, no  obvious depression or anxiety  ASSESSMENT AND PLAN:  Discussed the following assessment and plan:  Type II diabetes mellitus with neurological manifestations, uncontrolled - Plan: Ambulatory referral to Endocrinology, TSH, T4, Free, Hemoglobin A1c, Microalbumin/Creatinine Ratio, Urine  Essential hypertension, benign - Plan: Basic metabolic panel  Unspecified hypothyroidism - Plan: Ambulatory referral to Endocrinology, TSH  Bipolar II disorder  Hyperlipidemia - Plan: Lipid panel  -We reviewed the PMH, PSH, FH, SH, Meds and Allergies. -We provided refills for any medications we will prescribe as needed. -We addressed current concerns per orders and patient instructions. -We have asked for records for pertinent exams, studies, vaccines and notes from previous providers. -We have advised patient to follow up per instructions below. -for his chronically uncontrolled diabetes and given his report of remote large amounts of basal insulin and frustration with control in the past, after discussion he would like to see endocrinologist - referral placed -samples of levimir given given to take 10 units bid until he sees endocrine but explained likely he is going to need basal and mealtime insulin, he is not familiar with this type of insulin management -stressed importance of lifestyel changes, control of diabetes -he reports regular eye and foot exams with optho and podiatry -unsure why he is not on a statin -NON-fasting labs pending -he will continue his ortho care and disability with Dr. Alvan Dame and his psychiatric care with Dr. Eartha Inch (sp?)   -Patient advised to return or notify a doctor immediately if symptoms worsen or persist or new concerns arise.  Patient Instructions  -We placed a referral for you as discussed. It usually takes about 1-2 weeks to process and schedule this referral. If you have not heard from [place] regarding this appointment in 2 weeks please contact our office.  -We have  ordered labs or studies at this visit. It can take up to 1-2 weeks for results and processing. We will contact you with instructions IF your results are abnormal. Normal results will be released to your [facility]. If you have not heard from [place] or can not find your results in [facility] in 2 weeks please contact our office.  -PLEASE SIGN UP FOR MYCHART TODAY   We recommend the following healthy lifestyle measures: - eat a healthy diet consisting of lots of vegetables, fruits, beans, nuts, seeds, healthy meats such as white chicken and fish and whole grains.  - avoid fried foods, fast food, processed foods, sodas, red meet and other fattening foods.  - get a least 150 minutes of aerobic exercise per week.   Follow up in: 3 months      Anshi Jalloh R.

## 2013-05-03 NOTE — Patient Instructions (Signed)
-  We placed a referral for you as discussed. It usually takes about 1-2 weeks to process and schedule this referral. If you have not heard from [place] regarding this appointment in 2 weeks please contact our office.  -We have ordered labs or studies at this visit. It can take up to 1-2 weeks for results and processing. We will contact you with instructions IF your results are abnormal. Normal results will be released to your [facility]. If you have not heard from [place] or can not find your results in [facility] in 2 weeks please contact our office.  -PLEASE SIGN UP FOR MYCHART TODAY   We recommend the following healthy lifestyle measures: - eat a healthy diet consisting of lots of vegetables, fruits, beans, nuts, seeds, healthy meats such as white chicken and fish and whole grains.  - avoid fried foods, fast food, processed foods, sodas, red meet and other fattening foods.  - get a least 150 minutes of aerobic exercise per week.   Follow up in: 3 months

## 2013-05-04 LAB — LDL CHOLESTEROL, DIRECT: Direct LDL: 24.8 mg/dL

## 2013-05-05 ENCOUNTER — Encounter: Payer: Self-pay | Admitting: Family Medicine

## 2013-05-05 NOTE — Progress Notes (Signed)
Quick Note:  Attempted to call pt; no answer. Will call at a later time. ______

## 2013-05-06 ENCOUNTER — Telehealth: Payer: Self-pay

## 2013-05-06 NOTE — Telephone Encounter (Signed)
Relevant patient education assigned to patient using Emmi. ° °

## 2013-05-10 ENCOUNTER — Ambulatory Visit: Payer: BC Managed Care – PPO | Admitting: Endocrinology

## 2013-05-12 ENCOUNTER — Telehealth: Payer: Self-pay | Admitting: Family Medicine

## 2013-05-12 MED ORDER — LOSARTAN POTASSIUM 100 MG PO TABS: 100.0000 mg | ORAL_TABLET | Freq: Every day | ORAL | Status: DC

## 2013-05-12 NOTE — Telephone Encounter (Signed)
[facility] requesting new script for losartan (COZAAR) 100 MG tablet

## 2013-05-12 NOTE — Telephone Encounter (Signed)
Rx sent to pharmacy   

## 2013-05-25 MED ORDER — LOSARTAN POTASSIUM 100 MG PO TABS: 100.0000 mg | ORAL_TABLET | Freq: Every day | ORAL | Status: DC

## 2013-05-25 NOTE — Addendum Note (Signed)
Addended by: Colleen Can on: 05/25/2013 03:51 PM   Modules accepted: Orders

## 2013-05-25 NOTE — Telephone Encounter (Signed)
Pt called and requested rx be sent to [facility].  Rx resent to pharmacy.

## 2013-08-02 ENCOUNTER — Telehealth: Payer: Self-pay | Admitting: Family Medicine

## 2013-08-02 ENCOUNTER — Ambulatory Visit (INDEPENDENT_AMBULATORY_CARE_PROVIDER_SITE_OTHER): Payer: BC Managed Care – PPO | Admitting: Family Medicine

## 2013-08-02 ENCOUNTER — Encounter: Payer: Self-pay | Admitting: Family Medicine

## 2013-08-02 VITALS — BP 130/82 | HR 87 | Temp 98.2°F | Ht 71.0 in | Wt 254.0 lb

## 2013-08-02 DIAGNOSIS — F3181 Bipolar II disorder: Secondary | ICD-10-CM

## 2013-08-02 DIAGNOSIS — E039 Hypothyroidism, unspecified: Secondary | ICD-10-CM

## 2013-08-02 DIAGNOSIS — E785 Hyperlipidemia, unspecified: Secondary | ICD-10-CM

## 2013-08-02 DIAGNOSIS — IMO0002 Reserved for concepts with insufficient information to code with codable children: Secondary | ICD-10-CM

## 2013-08-02 DIAGNOSIS — F3189 Other bipolar disorder: Secondary | ICD-10-CM

## 2013-08-02 DIAGNOSIS — I10 Essential (primary) hypertension: Secondary | ICD-10-CM

## 2013-08-02 DIAGNOSIS — N058 Unspecified nephritic syndrome with other morphologic changes: Secondary | ICD-10-CM

## 2013-08-02 DIAGNOSIS — E1165 Type 2 diabetes mellitus with hyperglycemia: Secondary | ICD-10-CM

## 2013-08-02 DIAGNOSIS — E1129 Type 2 diabetes mellitus with other diabetic kidney complication: Secondary | ICD-10-CM

## 2013-08-02 LAB — HEMOGLOBIN A1C: HEMOGLOBIN A1C: 13.1 % — AB (ref 4.6–6.5)

## 2013-08-02 LAB — BASIC METABOLIC PANEL
BUN: 9 mg/dL (ref 6–23)
CALCIUM: 9.1 mg/dL (ref 8.4–10.5)
CO2: 27 mEq/L (ref 19–32)
Chloride: 100 mEq/L (ref 96–112)
Creatinine, Ser: 1 mg/dL (ref 0.4–1.5)
GFR: 84.67 mL/min (ref >60.00)
Glucose, Bld: 354 mg/dL — ABNORMAL HIGH (ref 70–99)
POTASSIUM: 3.6 meq/L (ref 3.5–5.1)
SODIUM: 134 meq/L — AB (ref 135–145)

## 2013-08-02 LAB — LIPID PANEL
CHOL/HDL RATIO: 4
Cholesterol: 105 mg/dL (ref 0–200)
HDL: 25.4 mg/dL — ABNORMAL LOW (ref >39.00)
LDL CALC: 19 mg/dL (ref 0–99)
Triglycerides: 302 mg/dL — ABNORMAL HIGH (ref 0.0–149.0)
VLDL: 60.4 mg/dL — AB (ref 0.0–40.0)

## 2013-08-02 LAB — TSH: TSH: 1.23 u[IU]/mL (ref 0.35–4.50)

## 2013-08-02 MED ORDER — ROSUVASTATIN CALCIUM 20 MG PO TABS: 40.0000 mg | ORAL_TABLET | Freq: Every day | ORAL | Status: DC

## 2013-08-02 MED ORDER — METFORMIN HCL 1000 MG PO TABS: 1000.0000 mg | ORAL_TABLET | Freq: Two times a day (BID) | ORAL | Status: DC

## 2013-08-02 MED ORDER — LOVASTATIN 20 MG PO TABS: 20.0000 mg | ORAL_TABLET | Freq: Every day | ORAL | Status: DC

## 2013-08-02 NOTE — Progress Notes (Signed)
No chief complaint on file.   HPI:  Patient Active Problem List   Diagnosis Date Noted  . Diabetes mellitus with renal manifestations, uncontrolled 05/03/2013  . Hypertension associated with diabetes 05/03/2013  . Unspecified hypothyroidism 05/03/2013  . Bipolar II disorder 05/03/2013  . Hyperlipidemia 05/03/2013   DM: per [facility] uncontrolled for > 2 years with BG in 200-500range on labs in chart in 2013  - Lab Results  Component Value Date   HGBA1C 11.9* 05/03/2013  -referred to endocrine, he reports he was too busy to go and did not reschedule -reports was frustrated with not getting good results with the insulin and cost  -reports does not check his BS at home  -has not been exercising -has never seen a diabetes educator  -some peripheral neuropathy  -sees optho and podiatry yearly   HTN/HLD:  -denies: CP, SOb, swelling, HAs  -reports never took a sttain and doesn't know why, denies   Hypothyroidism:  -reports has been on same synthroid similar dose for a long time   Bipolar Disorder:  -Dr. Carlean Jews, Psych  - needs labs for this -on lithium, seroquel, zoloft  -hx of remote hospitalization for suicide attempt in the past   Knee arthritis:  -sees Dr. Alvan Dame for this and on disability   Health Maintenance:  -refuses colon cancer screening - reports he would rather die and not know  -refuses pneumonia and flu vaccines   ROS: See pertinent positives and negatives per HPI.  Past Medical History  Diagnosis Date  . Diabetes mellitus   . Hypertension   . Hyperlipidemia     Past Surgical History  Procedure Laterality Date  . Knee surgery      Family History  Problem Relation Age of Onset  . Diabetes Mother   . Hypertension Mother   . Heart disease Father   . Heart attack Father 71    History   Social History  . Marital Status: Single    Spouse Name: N/A    Number of Children: N/A  . Years of Education: N/A   Social History Main Topics  . Smoking  status: Never Smoker   . Smokeless tobacco: None  . Alcohol Use: No  . Drug Use: No  . Sexual Activity: No   Other Topics Concern  . None   Social History Narrative   Work or School: on disability for knee arthritis      Home Situation: lives alone      Spiritual Beliefs:Christian      Lifestyle:no regular exercising; diet not great             Current outpatient prescriptions:levothyroxine (SYNTHROID, LEVOTHROID) 25 MCG tablet, , Disp: , Rfl: ;  lithium carbonate 300 MG capsule, Take 1,200 mg by mouth at bedtime., Disp: , Rfl: ;  losartan (COZAAR) 100 MG tablet, Take 1 tablet (100 mg total) by mouth daily., Disp: 90 tablet, Rfl: 1;  QUEtiapine (SEROQUEL XR) 300 MG 24 hr tablet, Take 75 mg by mouth at bedtime. , Disp: , Rfl: ;  sertraline (ZOLOFT) 100 MG tablet, , Disp: , Rfl:  metFORMIN (GLUCOPHAGE) 1000 MG tablet, Take 1 tablet (1,000 mg total) by mouth 2 (two) times daily with a meal., Disp: 180 tablet, Rfl: 3;  rosuvastatin (CRESTOR) 20 MG tablet, Take 2 tablets (40 mg total) by mouth daily., Disp: 90 tablet, Rfl: 3  EXAM:  Filed Vitals:   08/02/13 0835  BP: 130/82  Pulse: 87  Temp: 98.2 F (36.8 C)  Body mass index is 35.44 kg/(m^2).  GENERAL: vitals reviewed and listed above, alert, oriented, appears well hydrated and in no acute distress  HEENT: atraumatic, conjunttiva clear, no obvious abnormalities on inspection of external nose and ears  NECK: no obvious masses on inspection  LUNGS: clear to auscultation bilaterally, no wheezes, rales or rhonchi, good air movement  CV: HRRR, no peripheral edema  MS: moves all extremities without noticeable abnormality  PSYCH: pleasant and cooperative, no obvious depression or anxiety  ASSESSMENT AND PLAN:  Discussed the following assessment and plan:  Essential hypertension, benign  Diabetes mellitus with renal manifestations, uncontrolled - Plan: Hemoglobin A1c, metFORMIN (GLUCOPHAGE) 1000 MG tablet, Basic  metabolic panel  Hyperlipidemia - Plan: rosuvastatin (CRESTOR) 20 MG tablet, Lipid Panel  Bipolar II disorder - Plan: Lithium level  Unspecified hypothyroidism - Plan: TSH  -noncompliant and uncontrolled health issues -stressed importance of management - levimir sample provided, hypoglycemia management -Patient advised to return or notify a doctor immediately if symptoms worsen or persist or new concerns arise.  Patient Instructions  -We have ordered labs or studies at this visit. It can take up to 1-2 weeks for results and processing. We will contact you with instructions IF your results are abnormal. Normal results will be released to your [facility]. If you have not heard from [place] or can not find your results in [facility] in 2 weeks please contact our office.   -start lantus 20 units every morning until you see endocrinologist  -check you blood sugar every morning before you eat and keep a log to bring to ALL appointments  -start start metformin 500mg  twice daily for 1 week, then 1000mg  (two tab) twice daily  -start crestor 20mg  daily for 2 weeks, then 40mg  daily  -We recommend the following healthy lifestyle measures: - eat a healthy diet consisting of lots of vegetables, fruits, beans, nuts, seeds, healthy meats such as white chicken and fish and whole grains.  - avoid fried foods, fast food, processed foods, sodas, red meet and other fattening foods.  - get a least 150 minutes of aerobic exercise per week.   -call to schedule diabetes specialist appointment Address: [address] [place], [place] [address]  [phone]   -follow up with me in 3 months         Matthew Barnes

## 2013-08-02 NOTE — Telephone Encounter (Signed)
Let him know - thanks for following up with [place] immediately regarding this. Changed to another medication that is on the [facility] $4 list.  Also, labs are back and unfortunately his diabetes is really bad. We advise he needs to start the levemir and metformin and see the diabetes specialist as soon as possible.

## 2013-08-02 NOTE — Progress Notes (Signed)
Pre visit review using our clinic review tool, if applicable. No additional management support is needed unless otherwise documented below in the visit note. 

## 2013-08-02 NOTE — Telephone Encounter (Signed)
Pt states that his rx rosuvastatin (CRESTOR) 20 MG tablet, was $194.00 with insurance, pt states he can not afford to get it, pt wants to know if there is another rx cheaper  That can be called in.

## 2013-08-02 NOTE — Patient Instructions (Addendum)
-  We have ordered labs or studies at this visit. It can take up to 1-2 weeks for results and processing. We will contact you with instructions IF your results are abnormal. Normal results will be released to your [facility] If you have not heard from [place] or can not find your results in [facility] in 2 weeks please contact our office.   -start sample insulin provided 20 units every morning until you see endocrinologist  -check you blood sugar every morning before you eat and keep a log to bring to ALL appointments  -start start metformin 500mg  twice daily for 1 week, then 1000mg  (two tab) twice daily  -start crestor 20mg  daily for 2 weeks, then 40mg  daily  -We recommend the following healthy lifestyle measures: - eat a healthy diet consisting of lots of vegetables, fruits, beans, nuts, seeds, healthy meats such as white chicken and fish and whole grains.  - avoid fried foods, fast food, processed foods, sodas, red meet and other fattening foods.  - get a least 150 minutes of aerobic exercise per week.   -call to schedule diabetes specialist appointment Address: [address] [place], [place] [address]  [phone]   -follow up with me in 3 months

## 2013-08-03 ENCOUNTER — Telehealth: Payer: Self-pay | Admitting: Family Medicine

## 2013-08-03 LAB — LITHIUM LEVEL: Lithium Lvl: 0.7 mEq/L — ABNORMAL LOW (ref 0.80–1.40)

## 2013-08-03 MED ORDER — INSULIN DETEMIR 100 UNIT/ML FLEXPEN: 20.0000 [IU] | PEN_INJECTOR | Freq: Every day | SUBCUTANEOUS | Status: DC

## 2013-08-03 NOTE — Telephone Encounter (Signed)
Relevant patient education assigned to patient using Emmi. ° °

## 2013-08-03 NOTE — Telephone Encounter (Signed)
lmtcb

## 2013-08-03 NOTE — Telephone Encounter (Signed)
Pt notified, urgent referral put in for endocrinology, and levemir sent into pharmacy

## 2013-08-08 ENCOUNTER — Ambulatory Visit: Payer: BC Managed Care – PPO | Admitting: Endocrinology

## 2013-08-31 ENCOUNTER — Encounter: Payer: Medicare HMO | Attending: Endocrinology | Admitting: Nutrition

## 2013-08-31 ENCOUNTER — Ambulatory Visit (INDEPENDENT_AMBULATORY_CARE_PROVIDER_SITE_OTHER): Payer: Commercial Managed Care - HMO | Admitting: Endocrinology

## 2013-08-31 ENCOUNTER — Encounter: Payer: Self-pay | Admitting: Endocrinology

## 2013-08-31 ENCOUNTER — Other Ambulatory Visit: Payer: Self-pay | Admitting: *Deleted

## 2013-08-31 VITALS — BP 136/82 | HR 79 | Temp 97.8°F | Resp 16 | Ht 71.5 in | Wt 266.6 lb

## 2013-08-31 DIAGNOSIS — E785 Hyperlipidemia, unspecified: Secondary | ICD-10-CM

## 2013-08-31 DIAGNOSIS — I10 Essential (primary) hypertension: Secondary | ICD-10-CM

## 2013-08-31 DIAGNOSIS — E119 Type 2 diabetes mellitus without complications: Secondary | ICD-10-CM | POA: Insufficient documentation

## 2013-08-31 DIAGNOSIS — E1149 Type 2 diabetes mellitus with other diabetic neurological complication: Secondary | ICD-10-CM

## 2013-08-31 DIAGNOSIS — Z713 Dietary counseling and surveillance: Secondary | ICD-10-CM | POA: Diagnosis not present

## 2013-08-31 DIAGNOSIS — E1142 Type 2 diabetes mellitus with diabetic polyneuropathy: Secondary | ICD-10-CM | POA: Insufficient documentation

## 2013-08-31 DIAGNOSIS — E1140 Type 2 diabetes mellitus with diabetic neuropathy, unspecified: Secondary | ICD-10-CM | POA: Insufficient documentation

## 2013-08-31 DIAGNOSIS — E1159 Type 2 diabetes mellitus with other circulatory complications: Secondary | ICD-10-CM

## 2013-08-31 DIAGNOSIS — E1169 Type 2 diabetes mellitus with other specified complication: Secondary | ICD-10-CM

## 2013-08-31 DIAGNOSIS — I152 Hypertension secondary to endocrine disorders: Secondary | ICD-10-CM

## 2013-08-31 DIAGNOSIS — Z794 Long term (current) use of insulin: Secondary | ICD-10-CM | POA: Insufficient documentation

## 2013-08-31 MED ORDER — ONETOUCH DELICA LANCETS FINE MISC: Status: DC

## 2013-08-31 MED ORDER — INSULIN LISPRO PROT & LISPRO (75-25 MIX) 100 UNIT/ML KWIKPEN: 30.0000 [IU] | PEN_INJECTOR | Freq: Two times a day (BID) | SUBCUTANEOUS | Status: DC

## 2013-08-31 MED ORDER — GLUCOSE BLOOD VI STRP: ORAL_STRIP | Status: DC

## 2013-08-31 MED ORDER — VICTOZA 18 MG/3ML ~~LOC~~ SOPN: 1.2000 mg | PEN_INJECTOR | Freq: Every day | SUBCUTANEOUS | Status: DC

## 2013-08-31 NOTE — Patient Instructions (Signed)
Start VICTOZA injection with the sample pen once daily at the same time of the day.  Dial the dose to 0.6 mg for the first week.  You may  experience nausea in the first few days which usually gets better  After 1 week increase the dose to 1.2mg  daily if no nausea.  You may inject in the stomach, thigh or arm.    You will feel fullness of the stomach with starting the medication and should try to keep portions of food small.    Humalog Mix 40 units in am and 30 before supper  Please check blood sugars at least half the time about 2 hours after any meal and as directed on waking up.  Please bring blood sugar monitor to each visit

## 2013-08-31 NOTE — Progress Notes (Signed)
Patient ID: Matthew Barnes, male   DOB: 25-Nov-1955, 58 y.o.   MRN: [id number]    Reason for Appointment: Consultation for Type 2 Diabetes  Referring physician: Colin Benton  History of Present Illness:          Diagnosis: Type 2 diabetes mellitus, date of diagnosis:   2011       Past history:  He was started on metformin at diagnosis and apparently did have fairly good control initially However about a year later because of poor control he was given insulin in addition. He had been taking Lantus insulin until about 2/15, up to 80 units a day However he does not think his blood sugars have been controlled with this  Recent history:  Because of higher A1c of 13.1% he is now referred here for further management He has been quite noncompliant with checking his blood sugar because he does not have any strips for his monitor. Has not checked his blood sugars in several months, previously ranging from 300-400  He has had gradual weight loss over the last few years Also tends to stay thirsty and is having frequent urination including 3-5 times at night His Lantus was changed to Levemir in 2/15 because of insurance preference but is only taking 20 units a day Also his metformin was increased from 1 g to 2 g a day       Oral hypoglycemic drugs the patient is taking are:      Side effects from medications have been: INSULIN regimen is described as: Levemir    Glucose monitoring:  done one time a day         Glucometer:  Accu-Chek Aviva    Blood Glucose readings none available  Hypoglycemia: None  Glycemic control:  Lab Results  Component Value Date   HGBA1C 13.1* 08/02/2013   HGBA1C 11.9* 05/03/2013   HGBA1C  Value: 11.2 (NOTE)                                                                       According to the ADA Clinical Practice Recommendations for 2011, when HbA1c is used as a screening test:   >=6.5%   Diagnostic of Diabetes Mellitus           (if abnormal result  is confirmed)  5.7-6.4%    Increased risk of developing Diabetes Mellitus  References:Diagnosis and Classification of Diabetes Mellitus,Diabetes YHCW,2376,28(BTDVV 1):S62-S69 and Standards of Medical Care in         Diabetes - 2011,Diabetes Care,2011,34  (Suppl 1):S11-S61.* 03/31/2010   Lab Results  Component Value Date   MICROALBUR 2.0* 05/03/2013   LDLCALC 19 08/02/2013   CREATININE 1.0 08/02/2013    Self-care: The diet that the patient has been following is: None, unable to control portions especially when depressed  Meals: 3 meals per day.          Exercise:  some weight training, unable to do much aerobic activity because of knee pain         Dietician visit: Most recent: never               Compliance with the medical regimen: Fair  Retinal exam: Most recent: 5/15.    Weight history: baseline  295  Wt Readings from Last 3 Encounters:  08/31/13 266 lb 9.6 oz (120.929 kg)  08/02/13 254 lb (115.214 kg)  05/03/13 268 lb (121.564 kg)      Medication List       This list is accurate as of: 08/31/13 11:09 AM.  Always use your most recent med list.               Insulin Detemir 100 UNIT/ML Pen  Commonly known as:  LEVEMIR  Inject 20 Units into the skin daily at 10 pm.     levothyroxine 25 MCG tablet  Commonly known as:  SYNTHROID, LEVOTHROID     lithium carbonate 300 MG capsule  Take 1,200 mg by mouth at bedtime.     losartan 100 MG tablet  Commonly known as:  COZAAR  Take 1 tablet (100 mg total) by mouth daily.     lovastatin 20 MG tablet  Commonly known as:  MEVACOR  Take 1 tablet (20 mg total) by mouth at bedtime.     metFORMIN 1000 MG tablet  Commonly known as:  GLUCOPHAGE  Take 1 tablet (1,000 mg total) by mouth 2 (two) times daily with a meal.     sertraline 25 MG tablet  Commonly known as:  ZOLOFT  Take 25 mg by mouth daily.     sertraline 100 MG tablet  Commonly known as:  ZOLOFT        Allergies: No Known Allergies  Past Medical History  Diagnosis Date  . Diabetes mellitus    . Hypertension   . Hyperlipidemia     Past Surgical History  Procedure Laterality Date  . Knee surgery      Family History  Problem Relation Age of Onset  . Diabetes Mother   . Hypertension Mother   . Heart disease Father   . Heart attack Father 46    Social History:  reports that he has never smoked. He does not have any smokeless tobacco history on file. He reports that he does not drink alcohol or use illicit drugs.    Review of Systems       Lipids: He has had marked increase in triglycerides previously. Currently only on 20 mg lovastatin       Lab Results  Component Value Date   CHOL 105 08/02/2013   HDL 25.40* 08/02/2013   LDLCALC 19 08/02/2013   LDLDIRECT 24.8 05/03/2013   TRIG 302.0* 08/02/2013   CHOLHDL 4 08/02/2013        No unusual headaches.                  Skin: No rash or infections     Thyroid:  Has had mild hypothyroidism for a few years. Does complain of easy fatigability  Lab Results  Component Value Date   TSH 1.23 08/02/2013       The blood pressure has been well controlled with losartan     No swelling of feet.     No shortness of breath or chest discomfort on exertion.     Bowel habits: Normal.       He does have frequency of urination or nocturia but no weakness of the stream       Knee joint  Pains present.      Bipolar depression for several years, still having some depression symptoms. Recently dose of Zoloft was increased         No history of Numbness, tingling or burning in feet. Sometimes  his right foot gets cold and tight especially at night  Other review of systems are negative   LABS:  No visits with results within 1 Week(s) from this visit. Latest known visit with results is:  Office Visit on 08/02/2013  Component Date Value Ref Range Status  . Cholesterol 08/02/2013 105  0 - 200 mg/dL Final   ATP III Classification       Desirable:  < 200 mg/dL               Borderline High:  200 - 239 mg/dL          High:  > = 240 mg/dL   . Triglycerides 08/02/2013 302.0* 0.0 - 149.0 mg/dL Final   Normal:  <150 mg/dLBorderline High:  150 - 199 mg/dL  . HDL 08/02/2013 25.40* >39.00 mg/dL Final  . VLDL 08/02/2013 60.4* 0.0 - 40.0 mg/dL Final  . LDL Cholesterol 08/02/2013 19  0 - 99 mg/dL Final  . Total CHOL/HDL Ratio 08/02/2013 4   Final                  Men          Women1/2 Average Risk     3.4          3.3Average Risk          5.0          4.42X Average Risk          9.6          7.13X Average Risk          15.0          11.0                      . Hemoglobin A1C 08/02/2013 13.1* 4.6 - 6.5 % Final   Glycemic Control Guidelines for People with Diabetes:Non Diabetic:  <6%Goal of Therapy: <7%Additional Action Suggested:  >8%   . Sodium 08/02/2013 134* 135 - 145 mEq/L Final  . Potassium 08/02/2013 3.6  3.5 - 5.1 mEq/L Final  . Chloride 08/02/2013 100  96 - 112 mEq/L Final  . CO2 08/02/2013 27  19 - 32 mEq/L Final  . Glucose, Bld 08/02/2013 354* 70 - 99 mg/dL Final  . BUN 08/02/2013 9  6 - 23 mg/dL Final  . Creatinine, Ser 08/02/2013 1.0  0.4 - 1.5 mg/dL Final  . Calcium 08/02/2013 9.1  8.4 - 10.5 mg/dL Final  . GFR 08/02/2013 84.67  >60.00 mL/min Final  . TSH 08/02/2013 1.23  0.35 - 4.50 uIU/mL Final  . Lithium Lvl 08/02/2013 0.70* 0.80 - 1.40 mEq/L Final    Physical Examination:  BP 136/82  Pulse 79  Temp(Src) 97.8 F (36.6 C)  Resp 16  Ht 5' 11.5" (1.816 m)  Wt 266 lb 9.6 oz (120.929 kg)  BMI 36.67 kg/m2  SpO2 95%  GENERAL:         Patient has generalized obesity.   HEENT:         Eye exam shows normal external appearance. Fundus exam shows no retinopathy. Oral exam shows normal mucosa .  NECK:         General:  Neck exam shows no lymphadenopathy. Carotids are normal to palpation and no bruit heard.  Thyroid is not enlarged and no nodules felt.   LUNGS:         Chest is symmetrical. Lungs are clear to auscultation.Marland Kitchen   HEART:  Heart sounds:  S1 and S2 are normal. No murmurs or clicks heard., no S3 or S4.    ABDOMEN:   There is no distention present. Liver and spleen are not palpable. No other mass or tenderness present.  EXTREMITIES:     There is no edema.   NEUROLOGICAL:   Vibration sense is severely reduced in left toes and absent on the right. Ankle jerks are absent bilaterally.          Diabetic foot exam:  Some claw deformity of distal foot, prominence of the metatarsal heads Patchy decrease in sensation in the toes especially right and decreased on the plantar surface also Callus formation on distal left foot MUSCULOSKELETAL:  joints in the fingers normal. Spine is normal to inspection.Marland Kitchen   SKIN:  he has several areas of irregular erythematous patches on his trunk and upper thighs         ASSESSMENT:  Diabetes type 2, uncontrolled     He has very poor control of his diabetes with A1c 13% Most likely has significant insulin deficiency along with insulin resistance Although he has lost some weight recently he is usually unable to watch his diet and control portions because of his depression  Also has limited exercise ability because of knee joint pains  Currently taking only metformin 2 g daily and minimal amounts of Levemir insulin Discussed with the patient the need for adequate insulin dosage Discussed in detail the actions of both basal and bolus insulin as well as normal pancreatic insulin secretion patterns Also explained need for additional therapy to help insulin sensitivity, weight loss and decreased liver glucose output  Discussed with the patient the nature of GLP-1 drugs, the action on various organ systems, how they benefit blood glucose control, as well as the benefit of weight loss and  increase satiety . Explained possible side effects especially nausea and vomiting; discussed safety information in package insert. Described injection technique and dosage titration of Victoza  starting with 0.6 mg once a day at the same time for the first week and then increasing to 1.2 mg if  no symptoms of nausea. Patient brochure on Victoza and co-pay card given  Complications: Peripheral neuropathy with mild sensory loss  Hypertension: Well controlled  Hyperlipidemia: Still has high triglycerides and low HDL, to followup lipid panel and also lipoprotein panel also when blood sugars are better controlled  PLAN:   Start checking blood sugar with a new One Touch Verio monitor that was demonstrated today by nurse educator  Start Victoza as above  For simplicity will use premixed insulin twice a day using Humalog Mix 40 units in am and 30 before supper  He will followup in 3 weeks for further adjustment of his insulin and review of his blood sugar from meter download  Encouraged him to reduce portions  He will be scheduled to see the dietitian for meal planning  Given handout on foot care and discussed implications of diabetic neuropathy  Counseling time over 50% of today's 60 minute visit  Elayne Snare 08/31/2013, 11:09 AM   Note: This office note was prepared with Dragon voice recognition system technology. Any transcriptional errors that result from this process are unintentional.

## 2013-09-15 ENCOUNTER — Encounter: Payer: Self-pay | Admitting: Family Medicine

## 2013-09-15 ENCOUNTER — Other Ambulatory Visit: Payer: Self-pay | Admitting: *Deleted

## 2013-09-15 ENCOUNTER — Ambulatory Visit (INDEPENDENT_AMBULATORY_CARE_PROVIDER_SITE_OTHER): Payer: Commercial Managed Care - HMO | Admitting: Family Medicine

## 2013-09-15 VITALS — BP 140/90 | HR 90 | Temp 98.2°F | Ht 71.5 in | Wt 268.5 lb

## 2013-09-15 DIAGNOSIS — N058 Unspecified nephritic syndrome with other morphologic changes: Secondary | ICD-10-CM

## 2013-09-15 DIAGNOSIS — IMO0002 Reserved for concepts with insufficient information to code with codable children: Secondary | ICD-10-CM

## 2013-09-15 DIAGNOSIS — E1129 Type 2 diabetes mellitus with other diabetic kidney complication: Secondary | ICD-10-CM

## 2013-09-15 DIAGNOSIS — R21 Rash and other nonspecific skin eruption: Secondary | ICD-10-CM

## 2013-09-15 DIAGNOSIS — F3189 Other bipolar disorder: Secondary | ICD-10-CM

## 2013-09-15 DIAGNOSIS — F3181 Bipolar II disorder: Secondary | ICD-10-CM

## 2013-09-15 DIAGNOSIS — E1165 Type 2 diabetes mellitus with hyperglycemia: Secondary | ICD-10-CM

## 2013-09-15 MED ORDER — LORATADINE 10 MG PO TABS: 10.0000 mg | ORAL_TABLET | Freq: Every day | ORAL | Status: DC

## 2013-09-15 MED ORDER — TRIAMCINOLONE ACETONIDE 0.1 % EX CREA: 1.0000 "application " | TOPICAL_CREAM | Freq: Two times a day (BID) | CUTANEOUS | Status: DC

## 2013-09-15 NOTE — Progress Notes (Signed)
Pre visit review using our clinic review tool, if applicable. No additional management support is needed unless otherwise documented below in the visit note. 

## 2013-09-15 NOTE — Patient Instructions (Signed)
-  cerave CREAM daily  -triamcinilone cream twice daily for 2 weeks  -use only hypoallergenic gentle soaps, detergents   -follow up in 1 month

## 2013-09-15 NOTE — Progress Notes (Signed)
No chief complaint on file.   HPI:  Acute visit for:  1)Rash: -started about 1 year ago -very itchy rash that comes and goes on - worse with water -on legs and trunk -denies: fever, joint pains, malaise, pain -told eczema in the past and given cream (doesn't remember name of cream) which did not help  2) Bipolar disorder: -followed by psych and has order from psych for lithium level and wants [place] to get this today and send to psych -stable  3)DM: -very happy with his appt with endocrine - appreciate recs and care -reports BS much improved on addition of victoza and mxed insulin regimen   ROS: See pertinent positives and negatives per HPI.  Past Medical History  Diagnosis Date  . Diabetes mellitus   . Hypertension   . Hyperlipidemia     Past Surgical History  Procedure Laterality Date  . Knee surgery      Family History  Problem Relation Age of Onset  . Diabetes Mother   . Hypertension Mother   . Heart disease Father   . Heart attack Father 8    History   Social History  . Marital Status: Single    Spouse Name: N/A    Number of Children: N/A  . Years of Education: N/A   Social History Main Topics  . Smoking status: Never Smoker   . Smokeless tobacco: None  . Alcohol Use: No  . Drug Use: No  . Sexual Activity: No   Other Topics Concern  . None   Social History Narrative   Work or School: on disability for knee arthritis      Home Situation: lives alone      Spiritual Beliefs:Christian      Lifestyle:no regular exercising; diet not great             Current outpatient prescriptions:glucose blood (ONETOUCH VERIO) test strip, Use as instructed to check blood sugar 2 times per day dx code 250.42, Disp: 100 each, Rfl: 12;  Insulin Lispro Prot & Lispro (HUMALOG MIX 75/25 KWIKPEN) (75-25) 100 UNIT/ML Kwikpen, Inject 30-40 Units into the skin 2 (two) times daily before a meal., Disp: 15 mL, Rfl: 11 lithium carbonate 300 MG capsule, Take 300 mg by  mouth 2 (two) times daily. Takes 3 twice a day, Disp: , Rfl: ;  losartan (COZAAR) 100 MG tablet, Take 1 tablet (100 mg total) by mouth daily., Disp: 90 tablet, Rfl: 1;  lovastatin (MEVACOR) 20 MG tablet, Take 1 tablet (20 mg total) by mouth at bedtime., Disp: 90 tablet, Rfl: 3 metFORMIN (GLUCOPHAGE) 1000 MG tablet, Take 1 tablet (1,000 mg total) by mouth 2 (two) times daily with a meal., Disp: 180 tablet, Rfl: 3;  ONETOUCH DELICA LANCETS FINE MISC, Use to check blood sugars 2 times per day dx code 250.42, Disp: 100 each, Rfl: 3;  sertraline (ZOLOFT) 50 MG tablet, Take 50 mg by mouth daily. Takes 3 every morning, Disp: , Rfl:  VICTOZA 18 MG/3ML SOPN, Inject 1.2 mg into the skin daily. Inject once daily at the same time, Disp: 2 pen, Rfl: 3;  triamcinolone cream (KENALOG) 0.1 %, Apply 1 application topically 2 (two) times daily., Disp: 454 g, Rfl: 0  EXAM:  Filed Vitals:   09/15/13 0841  BP: 140/90  Pulse: 90  Temp: 98.2 F (36.8 C)    Body mass index is 36.93 kg/(m^2).  GENERAL: vitals reviewed and listed above, alert, oriented, appears well hydrated and in no acute distress  HEENT:  atraumatic, conjunttiva clear, no obvious abnormalities on inspection of external nose and ears  NECK: no obvious masses on inspection  LUNGS: clear to auscultation bilaterally, no wheezes, rales or rhonchi, good air movement  CV: HRRR, no peripheral edema  MS: moves all extremities without noticeable abnormality  SKIN: extensive scally, erythematous patches on bothe legs and trunk with excoriation  PSYCH: pleasant and cooperative, no obvious depression or anxiety  ASSESSMENT AND PLAN:  Discussed the following assessment and plan:  Rash and nonspecific skin eruption - Plan: triamcinolone cream (KENALOG) 0.1 % -we discussed possible serious and likely etiologies, workup and treatment, treatment risks and return precautions - possible extensive eczema, discuss other portential etiologies and offered derm  referral -after this discussion, Taim opted for trial of topical steroid and emollient and close follow pu -follow up advised in 1 month -of course, we advised Killian  to return or notify a doctor immediately if symptoms worsen or persist or new concerns arise.  Bipolar II disorder - Managed by Marye Round [phone]) - Plan: Lithium level -lithium level fax to psych  Diabetes mellitus with renal manifestations, uncontrolled -improving  -Patient advised to return or notify a doctor immediately if symptoms worsen or persist or new concerns arise.  There are no Patient Instructions on file for this visit.   Matthew Benton R.

## 2013-09-16 LAB — LITHIUM LEVEL: LITHIUM LVL: 0.7 meq/L — AB (ref 0.80–1.40)

## 2013-09-21 NOTE — Patient Instructions (Signed)
Start victoza 0.6 once daily.   Increase the Victoza dose to 1.2 after 7 days if no nausea Stop the Levemir insulin Inject Humalog Mix insulin (after mixing), take 40u before breakfast, and 30u before supper. Call if questions or if blood sugars drop low.

## 2013-09-21 NOTE — Progress Notes (Signed)
We discussed how/when to inject the Victoza.  We also discussed how this medication with help his blood sugar, and the need to take it daily.  He was instructed to start at 0.6 and to give this once a day.  He was given a Victoza starter kit with directions for increasing the dose to1.2  after 7 days if no nausea.  He reported good understanding of this.  We also discussed the new insulin:  Humalog Mix.  He was instructed to stop the Levemir and begin this new insulin.  He was instructed to mix the insulin before injecting.  He reported good understanding of this.  He will take 40 u 10-15 min. Before breakfast,and 30u  10-15 min. Before supper.  Written instructions were given for this.  Discussed also, the need to test blood sugars after meals to see if this insulin and Victoza are working.  He agreed to test 2hr. After one meal per day--varying the meal each day.   We reviewed low blood sugars--symptoms and treatments.  He reported good understanding.   He had no final questions

## 2013-09-26 ENCOUNTER — Telehealth: Payer: Self-pay | Admitting: *Deleted

## 2013-09-26 ENCOUNTER — Ambulatory Visit: Payer: Commercial Managed Care - HMO | Admitting: Family Medicine

## 2013-09-26 ENCOUNTER — Telehealth: Payer: Self-pay | Admitting: Family Medicine

## 2013-09-26 NOTE — Telephone Encounter (Signed)
Patient walked in to the office regarding the rash on his leg is not better.  I scheduled the pt an appt and he stated he did not need to see Dr Maudie Mercury today as he only wanted a referral to see a dermatologist as the rash on his leg is not getting better.  I advised the pt per Dr Maudie Mercury he does not need a referral and the pamphlet was given to call the dermatologist of his choice for an appt and to ask to be put on their wait list as it can takes weeks-months to get an appt and he agreed and I cancelled the appt with Dr Maudie Mercury for today.

## 2013-10-05 ENCOUNTER — Encounter: Payer: Self-pay | Admitting: Endocrinology

## 2013-10-05 ENCOUNTER — Other Ambulatory Visit: Payer: Self-pay | Admitting: *Deleted

## 2013-10-05 ENCOUNTER — Ambulatory Visit (INDEPENDENT_AMBULATORY_CARE_PROVIDER_SITE_OTHER): Payer: Commercial Managed Care - HMO | Admitting: Endocrinology

## 2013-10-05 VITALS — BP 126/82 | HR 91 | Temp 98.3°F | Resp 14 | Ht 71.5 in | Wt 263.0 lb

## 2013-10-05 DIAGNOSIS — IMO0002 Reserved for concepts with insufficient information to code with codable children: Secondary | ICD-10-CM

## 2013-10-05 DIAGNOSIS — E1165 Type 2 diabetes mellitus with hyperglycemia: Principal | ICD-10-CM

## 2013-10-05 DIAGNOSIS — E039 Hypothyroidism, unspecified: Secondary | ICD-10-CM

## 2013-10-05 DIAGNOSIS — E785 Hyperlipidemia, unspecified: Secondary | ICD-10-CM

## 2013-10-05 DIAGNOSIS — E1129 Type 2 diabetes mellitus with other diabetic kidney complication: Secondary | ICD-10-CM

## 2013-10-05 MED ORDER — LEVOTHYROXINE SODIUM 50 MCG PO TABS: 50.0000 ug | ORAL_TABLET | Freq: Every day | ORAL | Status: DC

## 2013-10-05 NOTE — Progress Notes (Signed)
Patient ID: Matthew Barnes, male   DOB: [birth year], 58 y.o.   MRN: [id number]    Reason for Appointment: Followup for Type 2 Diabetes  Referring physician: Colin Benton  History of Present Illness:          Diagnosis: Type 2 diabetes mellitus, date of diagnosis:   2011       Past history:  He was started on metformin at diagnosis and apparently did have fairly good control initially However about a year later because of poor control he was given insulin in addition. He had been taking Lantus insulin until about 2/15, up to 80 units a day However he does not think his blood sugars have been controlled with this  Recent history:  Because of higher A1c of 13.1% he was referred here for diabetes management in 6/15 She was started on glucose monitoring and is regimen was changed from low dose Levemir to Humalog mix 70 units a day He was also started on Victoza  to help with blood sugar control and facilitate weight loss He has been tolerating Victoza 1.2 mg and has taken insulin as directed Although his monitor cannot be downloaded today his blood sugars look excellent; has only mild increase in blood sugars after supper but sometimes is checking these right after eating       Oral hypoglycemic drugs the patient is taking are: Metformin 1 g twice a day      Side effects from medications have been: None INSULIN regimen is described as: Humalog mix AC BID: 40--30  Glucose monitoring:  done one time a day         Glucometer:  One Touch Verio    Blood Glucose readings from review of monitor:am 94, 119 and nonfasting 116-190 only 2 readings after dinner over 150  Hypoglycemia: But he may feel a little hungry/shaky if he is late for lunch  Glycemic control:  Lab Results  Component Value Date   HGBA1C 13.1* 08/02/2013   HGBA1C 11.9* 05/03/2013   HGBA1C  Value: 11.2 (NOTE)                                                                       According to the ADA Clinical Practice Recommendations for  2011, when HbA1c is used as a screening test:   >=6.5%   Diagnostic of Diabetes Mellitus           (if abnormal result  is confirmed)  5.7-6.4%   Increased risk of developing Diabetes Mellitus  References:Diagnosis and Classification of Diabetes Mellitus,Diabetes EZMO,2947,65(YYTKP 1):S62-S69 and Standards of Medical Care in         Diabetes - 2011,Diabetes Care,2011,34  (Suppl 1):S11-S61.* 03/31/2010   Lab Results  Component Value Date   MICROALBUR 2.0* 05/03/2013   LDLCALC 19 08/02/2013   CREATININE 1.0 08/02/2013    Self-care: The diet that the patient has been following is: None, unable to control portions especially when depressed  Meals: 3 meals per day.          Exercise:  some weight training, unable to do much aerobic activity because of knee pain         Dietician visit: Most recent: never  Compliance with the medical regimen: Fair  Retinal exam: Most recent: 5/15.    Weight history: baseline 295  Wt Readings from Last 3 Encounters:  10/05/13 263 lb (119.296 kg)  09/15/13 268 lb 8 oz (121.791 kg)  08/31/13 266 lb 9.6 oz (120.929 kg)      Medication List       This list is accurate as of: 10/05/13  1:14 PM.  Always use your most recent med list.               econazole nitrate 1 % cream     glucose blood test strip  Commonly known as:  ONETOUCH VERIO  Use as instructed to check blood sugar 2 times per day dx code 250.42     Insulin Lispro Prot & Lispro (75-25) 100 UNIT/ML Kwikpen  Commonly known as:  HUMALOG MIX 75/25 KWIKPEN  Inject 30-40 Units into the skin 2 (two) times daily before a meal.     lithium carbonate 300 MG capsule  Take 300 mg by mouth 2 (two) times daily. Takes 3 twice a day     loratadine 10 MG tablet  Commonly known as:  CLARITIN  Take 1 tablet (10 mg total) by mouth daily.     losartan 100 MG tablet  Commonly known as:  COZAAR  Take 1 tablet (100 mg total) by mouth daily.     lovastatin 20 MG tablet  Commonly known as:  MEVACOR   Take 1 tablet (20 mg total) by mouth at bedtime.     metFORMIN 1000 MG tablet  Commonly known as:  GLUCOPHAGE  Take 1 tablet (1,000 mg total) by mouth 2 (two) times daily with a meal.     ONETOUCH DELICA LANCETS FINE Misc  Use to check blood sugars 2 times per day dx code 250.42     sertraline 50 MG tablet  Commonly known as:  ZOLOFT  Take 50 mg by mouth daily. Takes 3 every morning     terbinafine 250 MG tablet  Commonly known as:  LAMISIL     triamcinolone cream 0.1 %  Commonly known as:  KENALOG  Apply 1 application topically 2 (two) times daily.     VICTOZA 18 MG/3ML Sopn  Generic drug:  Liraglutide  Inject 1.2 mg into the skin daily. Inject once daily at the same time        Allergies: No Known Allergies  Past Medical History  Diagnosis Date  . Diabetes mellitus   . Hypertension   . Hyperlipidemia     Past Surgical History  Procedure Laterality Date  . Knee surgery      Family History  Problem Relation Age of Onset  . Diabetes Mother   . Hypertension Mother   . Heart disease Father   . Heart attack Father 77    Social History:  reports that he has never smoked. He does not have any smokeless tobacco history on file. He reports that he does not drink alcohol or use illicit drugs.    Review of Systems       Lipids: He has had marked increase in triglycerides previously. Currently only on 20 mg lovastatin       Lab Results  Component Value Date   CHOL 105 08/02/2013   HDL 25.40* 08/02/2013   LDLCALC 19 08/02/2013   LDLDIRECT 24.8 05/03/2013   TRIG 302.0* 08/02/2013   CHOLHDL 4 08/02/2013       Thyroid:  Has had mild hypothyroidism for  a few years but not clear why he was taken off his 50 mcg supplement. His lithium dose has actually been increased recently . Does complain of easy fatigability  Lab Results  Component Value Date   TSH 1.23 08/02/2013        Diabetic neuropathy present.   His foot exam showed the following sensory findings: Patchy  decrease in sensation in the toes especially right and decreased on the plantar surface also   LABS:  No visits with results within 1 Week(s) from this visit. Latest known visit with results is:  Office Visit on 09/15/2013  Component Date Value Ref Range Status  . Lithium Lvl 09/15/2013 0.70* 0.80 - 1.40 mEq/L Final    Physical Examination:  BP 126/82  Pulse 91  Temp(Src) 98.3 F (36.8 C)  Resp 14  Ht 5' 11.5" (1.816 m)  Wt 263 lb (119.296 kg)  BMI 36.17 kg/m2  SpO2 94%  Not indicated    ASSESSMENT/PLAN:   Diabetes type 2, uncontrolled     He has much better control of his diabetes with increasing his insulin as well as starting Victoza along with his metformin Recent blood sugars are excellent with only minimal increase after supper His blood sugars tend to be low normal midday and he may feel mildly hypoglycemic at times He has been compliant with all his injectable drugs and will continue the same regimen He will try to increase his physical activity, may try a recumbent bike Also discussed balanced meals and needs nutritional counseling  History of hypothyroidism: She will need to continue his levothyroxine while he is on lithium, TSH was normal on 50 mcg and he will restart this. Will check his TSH again on the next visit   Matthew Barnes 10/05/2013, 1:14 PM   Note: This office note was prepared with Estate agent. Any transcriptional errors that result from this process are unintentional.

## 2013-10-05 NOTE — Patient Instructions (Addendum)
Insulin 36 units in am and 30 at supper  Please check blood sugars at least half the time about 2 hours after any meal and times per week on waking up.  Please bring blood sugar monitor to each visit  Exercise 4x per week  Synthroid 50ug

## 2013-10-25 ENCOUNTER — Other Ambulatory Visit: Payer: Self-pay | Admitting: Family Medicine

## 2013-11-16 ENCOUNTER — Ambulatory Visit: Payer: Commercial Managed Care - HMO | Admitting: Dietician

## 2013-11-17 ENCOUNTER — Telehealth: Payer: Self-pay | Admitting: *Deleted

## 2013-11-17 DIAGNOSIS — E1149 Type 2 diabetes mellitus with other diabetic neurological complication: Secondary | ICD-10-CM

## 2013-11-17 NOTE — Telephone Encounter (Signed)
Ok.  Referral sent

## 2013-11-17 NOTE — Telephone Encounter (Signed)
Patient informed to await call for an appointment.

## 2013-11-17 NOTE — Telephone Encounter (Signed)
Patient states that he wants a referral to [facility] and they knows his history.

## 2013-12-06 ENCOUNTER — Other Ambulatory Visit (INDEPENDENT_AMBULATORY_CARE_PROVIDER_SITE_OTHER): Payer: Commercial Managed Care - HMO

## 2013-12-06 DIAGNOSIS — E1129 Type 2 diabetes mellitus with other diabetic kidney complication: Secondary | ICD-10-CM

## 2013-12-06 DIAGNOSIS — E1165 Type 2 diabetes mellitus with hyperglycemia: Principal | ICD-10-CM

## 2013-12-06 DIAGNOSIS — E039 Hypothyroidism, unspecified: Secondary | ICD-10-CM

## 2013-12-06 DIAGNOSIS — IMO0002 Reserved for concepts with insufficient information to code with codable children: Secondary | ICD-10-CM

## 2013-12-06 LAB — COMPREHENSIVE METABOLIC PANEL
ALK PHOS: 37 U/L — AB (ref 39–117)
ALT: 32 U/L (ref 0–53)
AST: 33 U/L (ref 0–37)
Albumin: 4.2 g/dL (ref 3.5–5.2)
BUN: 12 mg/dL (ref 6–23)
CO2: 27 mEq/L (ref 19–32)
Calcium: 9.5 mg/dL (ref 8.4–10.5)
Chloride: 105 mEq/L (ref 96–112)
Creatinine, Ser: 1 mg/dL (ref 0.4–1.5)
GFR: 78.03 mL/min (ref >60.00)
GLUCOSE: 76 mg/dL (ref 70–99)
Potassium: 3.9 mEq/L (ref 3.5–5.1)
Sodium: 140 mEq/L (ref 135–145)
Total Bilirubin: 0.5 mg/dL (ref 0.2–1.2)
Total Protein: 7.2 g/dL (ref 6.0–8.3)

## 2013-12-06 LAB — HEMOGLOBIN A1C: Hgb A1c MFr Bld: 5.9 % (ref 4.6–6.5)

## 2013-12-06 LAB — TSH: TSH: 0.66 u[IU]/mL (ref 0.35–4.50)

## 2013-12-09 ENCOUNTER — Ambulatory Visit: Payer: Commercial Managed Care - HMO | Admitting: Endocrinology

## 2013-12-12 ENCOUNTER — Encounter: Payer: Self-pay | Admitting: *Deleted

## 2013-12-22 ENCOUNTER — Ambulatory Visit: Payer: Commercial Managed Care - HMO | Admitting: Dietician

## 2014-01-11 ENCOUNTER — Telehealth: Payer: Self-pay | Admitting: Family Medicine

## 2014-01-11 DIAGNOSIS — E1129 Type 2 diabetes mellitus with other diabetic kidney complication: Secondary | ICD-10-CM

## 2014-01-11 DIAGNOSIS — E785 Hyperlipidemia, unspecified: Secondary | ICD-10-CM

## 2014-01-11 DIAGNOSIS — E1165 Type 2 diabetes mellitus with hyperglycemia: Secondary | ICD-10-CM

## 2014-01-11 DIAGNOSIS — IMO0002 Reserved for concepts with insufficient information to code with codable children: Secondary | ICD-10-CM

## 2014-01-11 MED ORDER — LOVASTATIN 20 MG PO TABS: 20.0000 mg | ORAL_TABLET | Freq: Every day | ORAL | Status: DC

## 2014-01-11 MED ORDER — LOSARTAN POTASSIUM 100 MG PO TABS: 100.0000 mg | ORAL_TABLET | Freq: Every day | ORAL | Status: DC

## 2014-01-11 MED ORDER — METFORMIN HCL 1000 MG PO TABS: 1000.0000 mg | ORAL_TABLET | Freq: Two times a day (BID) | ORAL | Status: DC

## 2014-01-11 NOTE — Telephone Encounter (Signed)
[place], [facility] is requesting re-fills on the following: lovastatin (MEVACOR) 20 MG tablet losartan (COZAAR) 100 MG tablet metFORMIN (GLUCOPHAGE) 1000 MG tablet

## 2014-01-11 NOTE — Telephone Encounter (Signed)
Rxs done. 

## 2014-01-17 ENCOUNTER — Encounter: Payer: Self-pay | Admitting: Family Medicine

## 2014-01-17 ENCOUNTER — Ambulatory Visit (INDEPENDENT_AMBULATORY_CARE_PROVIDER_SITE_OTHER): Payer: Commercial Managed Care - HMO | Admitting: Family Medicine

## 2014-01-17 ENCOUNTER — Other Ambulatory Visit: Payer: Self-pay | Admitting: *Deleted

## 2014-01-17 ENCOUNTER — Telehealth: Payer: Self-pay | Admitting: *Deleted

## 2014-01-17 VITALS — BP 130/82 | HR 100 | Temp 97.9°F | Ht 71.5 in | Wt 288.5 lb

## 2014-01-17 DIAGNOSIS — Z23 Encounter for immunization: Secondary | ICD-10-CM

## 2014-01-17 DIAGNOSIS — E1169 Type 2 diabetes mellitus with other specified complication: Secondary | ICD-10-CM

## 2014-01-17 DIAGNOSIS — F3181 Bipolar II disorder: Secondary | ICD-10-CM

## 2014-01-17 DIAGNOSIS — G629 Polyneuropathy, unspecified: Secondary | ICD-10-CM

## 2014-01-17 DIAGNOSIS — M954 Acquired deformity of chest and rib: Secondary | ICD-10-CM

## 2014-01-17 DIAGNOSIS — K219 Gastro-esophageal reflux disease without esophagitis: Secondary | ICD-10-CM

## 2014-01-17 DIAGNOSIS — I152 Hypertension secondary to endocrine disorders: Secondary | ICD-10-CM

## 2014-01-17 DIAGNOSIS — E039 Hypothyroidism, unspecified: Secondary | ICD-10-CM

## 2014-01-17 DIAGNOSIS — E1159 Type 2 diabetes mellitus with other circulatory complications: Secondary | ICD-10-CM

## 2014-01-17 DIAGNOSIS — E1142 Type 2 diabetes mellitus with diabetic polyneuropathy: Secondary | ICD-10-CM

## 2014-01-17 DIAGNOSIS — I10 Essential (primary) hypertension: Secondary | ICD-10-CM

## 2014-01-17 LAB — BASIC METABOLIC PANEL
BUN: 16 mg/dL (ref 6–23)
CALCIUM: 8.9 mg/dL (ref 8.4–10.5)
CO2: 25 meq/L (ref 19–32)
CREATININE: 1 mg/dL (ref 0.4–1.5)
Chloride: 97 mEq/L (ref 96–112)
GFR: 82.56 mL/min (ref >60.00)
Glucose, Bld: 505 mg/dL (ref 70–99)
Potassium: 3.8 mEq/L (ref 3.5–5.1)
Sodium: 132 mEq/L — ABNORMAL LOW (ref 135–145)

## 2014-01-17 MED ORDER — INSULIN LISPRO PROT & LISPRO (75-25 MIX) 100 UNIT/ML KWIKPEN: 30.0000 [IU] | PEN_INJECTOR | Freq: Two times a day (BID) | SUBCUTANEOUS | Status: DC

## 2014-01-17 MED ORDER — VICTOZA 18 MG/3ML ~~LOC~~ SOPN: 1.2000 mg | PEN_INJECTOR | Freq: Every day | SUBCUTANEOUS | Status: DC

## 2014-01-17 MED ORDER — OMEPRAZOLE 20 MG PO CPDR: 20.0000 mg | DELAYED_RELEASE_CAPSULE | Freq: Every day | ORAL | Status: DC

## 2014-01-17 NOTE — Telephone Encounter (Signed)
Received call from [facility] with [facility], pt has critical Glucose level of  505. Dr. Maudie Mercury notified.

## 2014-01-17 NOTE — Addendum Note (Signed)
Addended by: Elmer Picker on: 01/17/2014 08:52 AM   Modules accepted: Orders

## 2014-01-17 NOTE — Telephone Encounter (Signed)
He needs to reschedule his canceled appointment for labs and office visit to discuss medications

## 2014-01-17 NOTE — Progress Notes (Signed)
No chief complaint on file.   HPI:  Acute visit for:  1) GERD: -chronic issues with this, reports recently this is recurring - gets heartburn and bloating after eating most meals -nexium really helped  -tums helps -denies: persistent abd pain,   2) DM: -meds: metformin, victoza, humalog, arb -much improved, appreciate help from endocrine - notes reviewed - appears he cancelled most recent appt -he is having trouble affording his victoza and insulin - several hundred dollars per month, he is getting close to running out of his medications -denies: polyuria, polydipsia, vision changes  3)Hypothyroid: -endocrine treating -on synthroid 78mcg -denies: palpitations, constipation, hot/cold flashes  4)Hyperlipidemia: -on statin -denies:  Bipolar: -managed by psych -he has an order from psych to draw BMP and lithium level  ROS: See pertinent positives and negatives per HPI.  Past Medical History  Diagnosis Date  . Diabetes mellitus   . Hypertension   . Hyperlipidemia   . Bipolar II disorder - Managed by Marye Round [phone]) 05/03/2013  . Unspecified hypothyroidism 05/03/2013    Past Surgical History  Procedure Laterality Date  . Knee surgery      Family History  Problem Relation Age of Onset  . Diabetes Mother   . Hypertension Mother   . Heart disease Father   . Heart attack Father 56    History   Social History  . Marital Status: Single    Spouse Name: N/A    Number of Children: N/A  . Years of Education: N/A   Social History Main Topics  . Smoking status: Never Smoker   . Smokeless tobacco: None  . Alcohol Use: No  . Drug Use: No  . Sexual Activity: No   Other Topics Concern  . None   Social History Narrative   Work or School: on disability for knee arthritis      Home Situation: lives alone      Spiritual Beliefs:Christian      Lifestyle:no regular exercising; diet not great             Current outpatient  prescriptions:econazole nitrate 1 % cream, , Disp: , Rfl: ;  glucose blood (ONETOUCH VERIO) test strip, Use as instructed to check blood sugar 2 times per day dx code 250.42, Disp: 100 each, Rfl: 12;  Insulin Lispro Prot & Lispro (HUMALOG MIX 75/25 KWIKPEN) (75-25) 100 UNIT/ML Kwikpen, Inject 30-40 Units into the skin 2 (two) times daily before a meal., Disp: 15 mL, Rfl: 11 levothyroxine (SYNTHROID, LEVOTHROID) 50 MCG tablet, Take 1 tablet (50 mcg total) by mouth daily., Disp: 30 tablet, Rfl: 3;  lithium carbonate 300 MG capsule, Take 300 mg by mouth 2 (two) times daily. Takes 3 twice a day, Disp: , Rfl: ;  loratadine (CLARITIN) 10 MG tablet, Take 1 tablet (10 mg total) by mouth daily., Disp: 30 tablet, Rfl: 11;  losartan (COZAAR) 100 MG tablet, Take 1 tablet (100 mg total) by mouth daily., Disp: 90 tablet, Rfl: 0 lovastatin (MEVACOR) 20 MG tablet, Take 1 tablet (20 mg total) by mouth at bedtime., Disp: 90 tablet, Rfl: 0;  metFORMIN (GLUCOPHAGE) 1000 MG tablet, Take 1 tablet (1,000 mg total) by mouth 2 (two) times daily with a meal., Disp: 180 tablet, Rfl: 0;  ONETOUCH DELICA LANCETS FINE MISC, Use to check blood sugars 2 times per day dx code 250.42, Disp: 100 each, Rfl: 3 sertraline (ZOLOFT) 50 MG tablet, Take 50 mg by mouth daily. Takes 3 every morning, Disp: , Rfl: ;  terbinafine (LAMISIL)  250 MG tablet, , Disp: , Rfl: ;  triamcinolone cream (KENALOG) 0.1 %, Apply 1 application topically 2 (two) times daily., Disp: 454 g, Rfl: 0;  VICTOZA 18 MG/3ML SOPN, Inject 1.2 mg into the skin daily. Inject once daily at the same time, Disp: 2 pen, Rfl: 3 omeprazole (PRILOSEC) 20 MG capsule, Take 1 capsule (20 mg total) by mouth daily., Disp: 90 capsule, Rfl: 3  EXAM:  Filed Vitals:   01/17/14 0809  BP: 130/82  Pulse: 100  Temp: 97.9 F (36.6 C)    Body mass index is 39.68 kg/(m^2).  GENERAL: vitals reviewed and listed above, alert, oriented, appears well hydrated and in no acute distress  HEENT:  atraumatic, conjunttiva clear, no obvious abnormalities on inspection of external nose and ears  NECK: no obvious masses on inspection  LUNGS: clear to auscultation bilaterally, no wheezes, rales or rhonchi, good air movement  CV: HRRR, no peripheral edema  ABD: Diastasis recti without appreciable hernia, prominence of lower sternum, no TTP in abdomen, BS+, soft, NTTP  MS: moves all extremities without noticeable abnormality  PSYCH: pleasant and cooperative, no obvious depression or anxiety  ASSESSMENT AND PLAN:  Discussed the following assessment and plan:  Gastroesophageal reflux disease, esophagitis presence not specified - Plan: omeprazole (PRILOSEC) 20 MG capsule  DM type 2 with diabetic peripheral neuropathy  Hypothyroidism, unspecified hypothyroidism type  Hypertension associated with diabetes  Chest wall deformity - Plan: DG Chest 2 View  -Patient advised to return or notify a doctor immediately if symptoms worsen or persist or new concerns arise.  Patient Instructions  BEFORE YOU LEAVE: -flu and pneumococcal vaccines today -schedule follow up in about 1-2 months  Call your endocrinologist to assist in your medications for your diabetes  Follow up in 1 month       Katie Moch R.

## 2014-01-17 NOTE — Addendum Note (Signed)
Addended by: Agnes Lawrence on: 01/17/2014 09:05 AM   Modules accepted: Orders

## 2014-01-17 NOTE — Patient Instructions (Addendum)
BEFORE YOU LEAVE: -flu and pneumococcal vaccines today -schedule follow up in about 1-2 months  Call your endocrinologist to assist in your medications for your diabetes  Follow up in 1 month

## 2014-01-17 NOTE — Telephone Encounter (Signed)
Spoke to pt, told him Glucose level is 505 and you need to contact Endocrinologist per Dr. Maudie Mercury. Pt verbalized understanding.

## 2014-01-17 NOTE — Telephone Encounter (Signed)
Please forward lab to his endocrinologist - he is having trouble affording his medications and needs to see endocrine to assist.

## 2014-01-17 NOTE — Progress Notes (Signed)
Pre visit review using our clinic review tool, if applicable. No additional management support is needed unless otherwise documented below in the visit note. 

## 2014-01-18 ENCOUNTER — Telehealth: Payer: Self-pay | Admitting: Family Medicine

## 2014-01-18 LAB — LITHIUM LEVEL: Lithium Lvl: 0.5 mEq/L — ABNORMAL LOW (ref 0.80–1.40)

## 2014-01-18 NOTE — Telephone Encounter (Signed)
emmi emailed °

## 2014-01-20 NOTE — Telephone Encounter (Signed)
Forms were given to the patient for Patient assistant through [employer]

## 2014-01-20 NOTE — Telephone Encounter (Signed)
Lm for pt to call back to reschedule appt

## 2014-01-25 ENCOUNTER — Ambulatory Visit: Payer: Commercial Managed Care - HMO | Admitting: Endocrinology

## 2014-01-27 ENCOUNTER — Ambulatory Visit: Payer: Commercial Managed Care - HMO | Admitting: Dietician

## 2014-01-30 ENCOUNTER — Encounter: Payer: Self-pay | Admitting: Family Medicine

## 2014-01-30 ENCOUNTER — Ambulatory Visit (INDEPENDENT_AMBULATORY_CARE_PROVIDER_SITE_OTHER): Payer: Commercial Managed Care - HMO | Admitting: Family Medicine

## 2014-01-30 VITALS — BP 142/94 | HR 95 | Temp 98.4°F | Ht 71.5 in | Wt 277.8 lb

## 2014-01-30 DIAGNOSIS — J069 Acute upper respiratory infection, unspecified: Secondary | ICD-10-CM

## 2014-01-30 NOTE — Patient Instructions (Signed)

## 2014-01-30 NOTE — Progress Notes (Signed)
Pre visit review using our clinic review tool, if applicable. No additional management support is needed unless otherwise documented below in the visit note. 

## 2014-01-30 NOTE — Progress Notes (Signed)
No chief complaint on file.   HPI:  Acute visit for:  URI: -has soreness, pain and redness in arm after flu shot -then developed cold - nasal congestion, cough, PND about 1 week ago, now getting a little better -denies: fever, SOB, body aches, sore throat, sinus pain -seeing Dr. Dwyane Dee in a few days for his diabetes -he thought the URI was from the flu shot   ROS: See pertinent positives and negatives per HPI.  Past Medical History  Diagnosis Date  . Diabetes mellitus   . Hypertension   . Hyperlipidemia   . Bipolar II disorder - Managed by Marye Round [phone]) 05/03/2013  . Unspecified hypothyroidism 05/03/2013    Past Surgical History  Procedure Laterality Date  . Knee surgery      Family History  Problem Relation Age of Onset  . Diabetes Mother   . Hypertension Mother   . Heart disease Father   . Heart attack Father 97    History   Social History  . Marital Status: Single    Spouse Name: N/A    Number of Children: N/A  . Years of Education: N/A   Social History Main Topics  . Smoking status: Never Smoker   . Smokeless tobacco: None  . Alcohol Use: No  . Drug Use: No  . Sexual Activity: No   Other Topics Concern  . None   Social History Narrative   Work or School: on disability for knee arthritis      Home Situation: lives alone      Spiritual Beliefs:Christian      Lifestyle:no regular exercising; diet not great             Current outpatient prescriptions: econazole nitrate 1 % cream, , Disp: , Rfl: ;  glucose blood (ONETOUCH VERIO) test strip, Use as instructed to check blood sugar 2 times per day dx code 250.42, Disp: 100 each, Rfl: 12;  Insulin Lispro Prot & Lispro (HUMALOG MIX 75/25 KWIKPEN) (75-25) 100 UNIT/ML Kwikpen, Inject 30-40 Units into the skin 2 (two) times daily before a meal., Disp: 15 mL, Rfl: 1 levothyroxine (SYNTHROID, LEVOTHROID) 50 MCG tablet, Take 1 tablet (50 mcg total) by mouth daily., Disp: 30 tablet, Rfl: 3;   lithium carbonate 300 MG capsule, Take 300 mg by mouth 2 (two) times daily. Takes 3 twice a day, Disp: , Rfl: ;  loratadine (CLARITIN) 10 MG tablet, Take 1 tablet (10 mg total) by mouth daily., Disp: 30 tablet, Rfl: 11;  losartan (COZAAR) 100 MG tablet, Take 1 tablet (100 mg total) by mouth daily., Disp: 90 tablet, Rfl: 0 lovastatin (MEVACOR) 20 MG tablet, Take 1 tablet (20 mg total) by mouth at bedtime., Disp: 90 tablet, Rfl: 0;  metFORMIN (GLUCOPHAGE) 1000 MG tablet, Take 1 tablet (1,000 mg total) by mouth 2 (two) times daily with a meal., Disp: 180 tablet, Rfl: 0;  omeprazole (PRILOSEC) 20 MG capsule, Take 1 capsule (20 mg total) by mouth daily., Disp: 90 capsule, Rfl: 3 ONETOUCH DELICA LANCETS FINE MISC, Use to check blood sugars 2 times per day dx code 250.42, Disp: 100 each, Rfl: 3;  sertraline (ZOLOFT) 50 MG tablet, Take 50 mg by mouth daily. Takes 3 every morning, Disp: , Rfl: ;  terbinafine (LAMISIL) 250 MG tablet, , Disp: , Rfl: ;  triamcinolone cream (KENALOG) 0.1 %, Apply 1 application topically 2 (two) times daily., Disp: 454 g, Rfl: 0 VICTOZA 18 MG/3ML SOPN, Inject 1.2 mg into the skin daily. Inject once daily  at the same time, Disp: 2 pen, Rfl: 1  EXAM:  Filed Vitals:   01/30/14 1355  BP: 142/94  Pulse: 95  Temp: 98.4 F (36.9 C)    Body mass index is 38.21 kg/(m^2).  GENERAL: vitals reviewed and listed above, alert, oriented, appears well hydrated and in no acute distress  HEENT: atraumatic, conjunttiva clear, no obvious abnormalities on inspection of external nose and ears, normal appearance of ear canals and TMs, clear nasal congestion, mild post oropharyngeal erythema with PND, no tonsillar edema or exudate, no sinus TTP  NECK: no obvious masses on inspection  LUNGS: clear to auscultation bilaterally, no wheezes, rales or rhonchi, good air movement  CV: HRRR, no peripheral edema  MS: moves all extremities without noticeable abnormality  PSYCH: pleasant and cooperative,  no obvious depression or anxiety  ASSESSMENT AND PLAN:  Discussed the following assessment and plan:  Viral upper respiratory illness  -improving, advised other then the sore ar, likely not from the flu shot -advised needs to work with his endocrinologist on the blood sugar medications, he has appt for this -he did not take BP meds today which explains why is bp is not at goal - advised he take these dialy -Patient advised to return or notify a doctor immediately if symptoms worsen or persist or new concerns arise.  Patient Instructions  INSTRUCTIONS FOR UPPER RESPIRATORY INFECTION:  -plenty of rest and fluids  -nasal saline wash 2-3 times daily (use prepackaged nasal saline or bottled/distilled water if making your own)   -can use AFRIN nasal spray for drainage and nasal congestion - but do NOT use longer then 3-4 days  -can use tylenol or ibuprofen as directed for aches and sorethroat  -in the winter time, using a humidifier at night is helpful (please follow cleaning instructions)  -if you are taking a cough medication - use only as directed, may also try a teaspoon of honey to coat the throat and throat lozenges  -for sore throat, salt water gargles can help  -follow up if you have fevers, facial pain, tooth pain, difficulty breathing or are worsening or not getting better in 5-7 days      Amarilys Lyles R.

## 2014-01-31 ENCOUNTER — Ambulatory Visit (INDEPENDENT_AMBULATORY_CARE_PROVIDER_SITE_OTHER): Payer: Commercial Managed Care - HMO | Admitting: Endocrinology

## 2014-01-31 ENCOUNTER — Encounter: Payer: Self-pay | Admitting: Endocrinology

## 2014-01-31 ENCOUNTER — Other Ambulatory Visit: Payer: Self-pay | Admitting: *Deleted

## 2014-01-31 VITALS — BP 172/111 | HR 90 | Temp 98.0°F | Resp 16 | Ht 71.0 in | Wt 277.8 lb

## 2014-01-31 DIAGNOSIS — IMO0002 Reserved for concepts with insufficient information to code with codable children: Secondary | ICD-10-CM

## 2014-01-31 DIAGNOSIS — E1165 Type 2 diabetes mellitus with hyperglycemia: Secondary | ICD-10-CM

## 2014-01-31 MED ORDER — INSULIN NPH ISOPHANE & REGULAR (70-30) 100 UNIT/ML ~~LOC~~ SUSP: 50.0000 [IU] | Freq: Three times a day (TID) | SUBCUTANEOUS | Status: DC

## 2014-01-31 MED ORDER — "INSULIN SYRINGE 31G X 5/16"" 1 ML MISC": Status: DC

## 2014-01-31 NOTE — Progress Notes (Signed)
Patient ID: Matthew Barnes, male   DOB: 1955/10/04, 58 y.o.   MRN: [id number]    Reason for Appointment: Followup for Type 2 Diabetes  Referring physician: Colin Benton  History of Present Illness:          Diagnosis: Type 2 diabetes mellitus, date of diagnosis:   2011       Past history:  He was started on metformin at diagnosis and apparently did have fairly good control initially However about a year later because of poor control he was given insulin in addition. He had been taking Lantus insulin until about 2/15, up to 80 units a day However he does not think his blood sugars have been controlled with this Because of higher A1c of 13.1% he was referred here for diabetes management in 6/15; was started on glucose monitoring and insulin was changed from low dose Levemir to Humalog mix 70 units a day  Recent history:   He was also started on Victoza  to help with blood sugar control and facilitate weight loss on his initial consultation With this regimen has blood sugars have been excellent and his A1c was down to normal and also had excellent readings at home He has not been seen in follow-up since 7/15 Not clear why his blood sugars are totally out of control now He has not taken any of his medications for 2-3 weeks because of high cost and may have not been taking them consistently before Also he says that he has had a respiratory infection in the last week and is drinking more juice He has had markedly increased blood sugars for at least 3 weeks and has symptoms of increased thirst and urination and blurred vision.   Although his weight is increased since 7/15 he has recently lost weight Also not clear why he is not taking his metformin recently  He has been back on his Humalog mix insulin a few days but blood sugars are not better  Oral hypoglycemic drugs the patient is taking are: Metformin 1 g twice a day, currently not taking      Side effects from medications have been:  None INSULIN regimen is described as: Humalog mix AC BID: 40--30  Glucose monitoring:  done 2.4 times a day         Glucometer:  One Touch Verio    Blood Glucose readings from review of monitor: recent range 368-601 with relatively higher readings in the afternoons but average blood sugar at any given time is usually over 450  No hypoglycemia  Glycemic control:  Lab Results  Component Value Date   HGBA1C 5.9 12/06/2013   HGBA1C 13.1* 08/02/2013   HGBA1C 11.9* 05/03/2013   Lab Results  Component Value Date   MICROALBUR 2.0* 05/03/2013   LDLCALC 19 08/02/2013   CREATININE 1.0 01/17/2014    Self-care: The diet that the patient has been following is: None, unable to control portions especially when depressed  Meals: 3 meals per day (dinner 6 pm)         Exercise:  some weight training, unable to do much aerobic activity because of knee pain and recent respiratory infection         Dietician visit: Most recent: never               Compliance with the medical regimen: Fair  Retinal exam: Most recent: 5/15.    Weight history: baseline 295  Wt Readings from Last 3 Encounters:  01/31/14 277 lb 12.8  oz (126.009 kg)  01/30/14 277 lb 12.8 oz (126.009 kg)  01/17/14 288 lb 8 oz (130.863 kg)      Medication List       This list is accurate as of: 01/31/14 11:23 AM.  Always use your most recent med list.               econazole nitrate 1 % cream     glucose blood test strip  Commonly known as:  ONETOUCH VERIO  Use as instructed to check blood sugar 2 times per day dx code 250.42     Insulin Lispro Prot & Lispro (75-25) 100 UNIT/ML Kwikpen  Commonly known as:  HUMALOG MIX 75/25 KWIKPEN  Inject 30-40 Units into the skin 2 (two) times daily before a meal.     levothyroxine 50 MCG tablet  Commonly known as:  SYNTHROID, LEVOTHROID  Take 1 tablet (50 mcg total) by mouth daily.     lithium carbonate 300 MG capsule  Take 300 mg by mouth 2 (two) times daily. Takes 3 twice a day      loratadine 10 MG tablet  Commonly known as:  CLARITIN  Take 1 tablet (10 mg total) by mouth daily.     losartan 100 MG tablet  Commonly known as:  COZAAR  Take 1 tablet (100 mg total) by mouth daily.     lovastatin 20 MG tablet  Commonly known as:  MEVACOR  Take 1 tablet (20 mg total) by mouth at bedtime.     metFORMIN 1000 MG tablet  Commonly known as:  GLUCOPHAGE  Take 1 tablet (1,000 mg total) by mouth 2 (two) times daily with a meal.     omeprazole 20 MG capsule  Commonly known as:  PRILOSEC  Take 1 capsule (20 mg total) by mouth daily.     ONETOUCH DELICA LANCETS FINE Misc  Use to check blood sugars 2 times per day dx code 250.42     sertraline 50 MG tablet  Commonly known as:  ZOLOFT  Take 50 mg by mouth daily. Takes 3 every morning     terbinafine 250 MG tablet  Commonly known as:  LAMISIL     triamcinolone cream 0.1 %  Commonly known as:  KENALOG  Apply 1 application topically 2 (two) times daily.     VICTOZA 18 MG/3ML Sopn  Generic drug:  Liraglutide  Inject 1.2 mg into the skin daily. Inject once daily at the same time        Allergies: No Known Allergies  Past Medical History  Diagnosis Date  . Diabetes mellitus   . Hypertension   . Hyperlipidemia   . Bipolar II disorder - Managed by Marye Round [phone]) 05/03/2013  . Unspecified hypothyroidism 05/03/2013    Past Surgical History  Procedure Laterality Date  . Knee surgery      Family History  Problem Relation Age of Onset  . Diabetes Mother   . Hypertension Mother   . Heart disease Father   . Heart attack Father 21    Social History:  reports that he has never smoked. He does not have any smokeless tobacco history on file. He reports that he does not drink alcohol or use illicit drugs.    Review of Systems       Lipids: He has had marked increase in triglycerides previously. Currently only on 20 mg lovastatin       Lab Results  Component Value Date   CHOL 105  08/02/2013  HDL 25.40* 08/02/2013   LDLCALC 19 08/02/2013   LDLDIRECT 24.8 05/03/2013   TRIG 302.0* 08/02/2013   CHOLHDL 4 08/02/2013       Thyroid:  Has had mild hypothyroidism for a few years but not clear why he was taken off his 50 mcg supplement. His lithium dose has actually been increased recently . Does complain of easy fatigability  Lab Results  Component Value Date   TSH 0.66 12/06/2013        Diabetic neuropathy present.   His foot exam showed the following sensory findings: Patchy decrease in sensation in the toes especially right and decreased on the plantar surface also   LABS:  No visits with results within 1 Week(s) from this visit. Latest known visit with results is:  Office Visit on 01/17/2014  Component Date Value Ref Range Status  . Sodium 01/17/2014 132* 135 - 145 mEq/L Final  . Potassium 01/17/2014 3.8  3.5 - 5.1 mEq/L Final  . Chloride 01/17/2014 97  96 - 112 mEq/L Final  . CO2 01/17/2014 25  19 - 32 mEq/L Final  . Glucose, Bld 01/17/2014 505* 70 - 99 mg/dL Final  . BUN 01/17/2014 16  6 - 23 mg/dL Final  . Creatinine, Ser 01/17/2014 1.0  0.4 - 1.5 mg/dL Final  . Calcium 01/17/2014 8.9  8.4 - 10.5 mg/dL Final  . GFR 01/17/2014 82.56  >60.00 mL/min Final  . Lithium Lvl 01/17/2014 0.50* 0.80 - 1.40 mEq/L Final    Physical Examination:  BP 172/111 mmHg  Pulse 90  Temp(Src) 98 F (36.7 C)  Resp 16  Ht 5\' 11"  (1.803 m)  Wt 277 lb 12.8 oz (126.009 kg)  BMI 38.76 kg/m2  SpO2 97%  Not indicated    ASSESSMENT/PLAN:   Diabetes type 2, uncontrolled     He has totally uncontrolled hyperglycemia with symptoms at this time even with restarting his insulin a few days ago Also has not taken metformin He is not responding to taking about 70 units of insulin a day   Since main problem with his management is financial difficulties and lack of insurance coverage at this time of the year he will be switched to generic Novolin 70/30 insulin For now will  double his insulin dose He will restart metformin Avoid juice Call if blood sugar is not improved in 3-4 days May leave off Victoza until he is able to get this through his insurance or the drug company  Patient Instructions  Humalog Mix or Novolin 70/30   60 UNITS, AT BFST... 20 AT LUNCH AND 60 AT DINNER  RESTART METFORMIN      [facility] 01/31/2014, 11:23 AM   Note: This office note was prepared with Estate agent. Any transcriptional errors that result from this process are unintentional.

## 2014-01-31 NOTE — Patient Instructions (Addendum)
Humalog Mix or Novolin 70/30   60 UNITS, AT BFST... 20 AT LUNCH AND 60 AT DINNER  RESTART METFORMIN

## 2014-02-01 ENCOUNTER — Ambulatory Visit: Payer: Commercial Managed Care - HMO | Admitting: Endocrinology

## 2014-02-10 NOTE — Telephone Encounter (Signed)
error 

## 2014-02-15 ENCOUNTER — Telehealth: Payer: Self-pay | Admitting: Endocrinology

## 2014-02-15 ENCOUNTER — Other Ambulatory Visit (INDEPENDENT_AMBULATORY_CARE_PROVIDER_SITE_OTHER): Payer: Commercial Managed Care - HMO

## 2014-02-15 DIAGNOSIS — E1165 Type 2 diabetes mellitus with hyperglycemia: Secondary | ICD-10-CM

## 2014-02-15 DIAGNOSIS — IMO0002 Reserved for concepts with insufficient information to code with codable children: Secondary | ICD-10-CM

## 2014-02-15 LAB — BASIC METABOLIC PANEL
BUN: 12 mg/dL (ref 6–23)
CO2: 24 mEq/L (ref 19–32)
CREATININE: 1.2 mg/dL (ref 0.4–1.5)
Calcium: 9 mg/dL (ref 8.4–10.5)
Chloride: 105 mEq/L (ref 96–112)
GFR: 66.11 mL/min (ref >60.00)
Glucose, Bld: 617 mg/dL (ref 70–99)
Potassium: 4.3 mEq/L (ref 3.5–5.1)
Sodium: 139 mEq/L (ref 135–145)

## 2014-02-15 NOTE — Telephone Encounter (Signed)
Patient Critical Glucose level is 617

## 2014-02-17 ENCOUNTER — Other Ambulatory Visit: Payer: Commercial Managed Care - HMO

## 2014-02-17 LAB — FRUCTOSAMINE: Fructosamine: 472 umol/L — ABNORMAL HIGH (ref 190–270)

## 2014-02-21 ENCOUNTER — Encounter: Payer: Self-pay | Admitting: *Deleted

## 2014-02-22 ENCOUNTER — Ambulatory Visit (INDEPENDENT_AMBULATORY_CARE_PROVIDER_SITE_OTHER): Payer: Commercial Managed Care - HMO | Admitting: Endocrinology

## 2014-02-22 ENCOUNTER — Encounter: Payer: Self-pay | Admitting: Endocrinology

## 2014-02-22 VITALS — BP 124/72 | HR 108 | Temp 98.0°F | Resp 16 | Ht 71.0 in | Wt 281.0 lb

## 2014-02-22 DIAGNOSIS — E1165 Type 2 diabetes mellitus with hyperglycemia: Secondary | ICD-10-CM

## 2014-02-22 DIAGNOSIS — E1159 Type 2 diabetes mellitus with other circulatory complications: Secondary | ICD-10-CM

## 2014-02-22 DIAGNOSIS — I10 Essential (primary) hypertension: Secondary | ICD-10-CM

## 2014-02-22 DIAGNOSIS — IMO0002 Reserved for concepts with insufficient information to code with codable children: Secondary | ICD-10-CM

## 2014-02-22 DIAGNOSIS — I152 Hypertension secondary to endocrine disorders: Secondary | ICD-10-CM

## 2014-02-22 DIAGNOSIS — E1169 Type 2 diabetes mellitus with other specified complication: Secondary | ICD-10-CM

## 2014-02-22 NOTE — Patient Instructions (Addendum)
Novolin 70/30: 60 units at breakfast and lunch and 45 before dinner (30 min before eating)  EXERCISE daily

## 2014-02-22 NOTE — Progress Notes (Signed)
Patient ID: Matthew Barnes, male   DOB: 1955-10-28, 58 y.o.   MRN: [id number]    Reason for Appointment: Followup for Type 2 Diabetes  Referring physician: Colin Benton  History of Present Illness:          Diagnosis: Type 2 diabetes mellitus, date of diagnosis:   2011       Past history:  He was started on metformin at diagnosis and apparently did have fairly good control initially However about a year later because of poor control he was given insulin in addition. He had been taking Lantus insulin until about 2/15, up to 80 units a day However he does not think his blood sugars  Were controlled with this Because of higher A1c of 13.1% he was referred here for diabetes management in 6/15; was started on glucose monitoring and insulin was changed from low dose Levemir to Humalog mix 70 units a day He was also started on Victoza  to help with blood sugar control and facilitate weight loss on his initial consultation  Recent history:   With  A regimen of Victoza and  Premixed insulin  Before breakfast and supper  He was having good control in 9/15 with an A1c of 5.9    However on his follow-up visit  In  Early 11/15 he had been quite irregular with his treatment regimen and blood sugars were markedly increased especially when he was not taking insulin   Overall has had significant difficulties with affording his insulin in the donut hole as well as Victoza He has not followed up with his application for the indigent program with the company for Victoza That was given.  Also had run out of his metformin. On his recent visit he was told to start 70/30 insulin with  An insulin syringe and he finds this less expensive However his blood sugars have been still quite erratic with readings as high as 600+  About 10 days ago.  He is still using his Humalog mix before breakfast but is taking Novolin N before lunch and supper On his own he has increased the doses of insulin significantly and is now  taking 150 units a day compared to 70 units previously when he was taking metformin and Victoza  Current blood sugar patterns:   fasting readings are improving although not consistent  And last few days ranging from 106-185   his readings overnight are variable with the lowest reading 73 when he felt hypoglycemic   although blood sugars at lunchtime were still high he has not checked any for the last week   blood sugars after lunch are still mostly over 300 ; however glucose before dinner at 7 PM was 129  Blood sugars after her evening meal a mildly increased, mostly over 200   Today he did not eat lunch  And had only a bagel without any protein this morning ; the office he started getting symptomatic with hypoglycemia and a glucose of 61   his median glucose for the last 2 weeks =341  Oral hypoglycemic drugs the patient is taking are: Metformin 1 g twice a day      Side effects from medications have been: None INSULIN regimen is described as: Humalog mix ACB , Novolin 50 acl and acs (6-7 pm)  Glucose monitoring:  done 2.4 times a day         Glucometer:  One Touch Verio    Blood Glucose readings from review of monitor as above  rare hypoglycemia present  Glycemic control:  Lab Results  Component Value Date   HGBA1C 5.9 12/06/2013   HGBA1C 13.1* 08/02/2013   HGBA1C 11.9* 05/03/2013   Lab Results  Component Value Date   MICROALBUR 2.0* 05/03/2013   LDLCALC 19 08/02/2013   CREATININE 1.2 02/15/2014    Self-care: The diet that the patient has been following is: None, unable to control portions especially when depressed  Meals: 3 meals per day (dinner 6 pm)         Exercise:  some weight training, unable to do much aerobic activity because of knee pain and recent respiratory infection         Dietician visit: Most recent: never               Compliance with the medical regimen: Fair  Retinal exam: Most recent: 5/15.    Weight history: baseline 295  Wt Readings from Last  3 Encounters:  02/22/14 281 lb (127.461 kg)  01/31/14 277 lb 12.8 oz (126.009 kg)  01/30/14 277 lb 12.8 oz (126.009 kg)      Medication List       This list is accurate as of: 02/22/14  2:29 PM.  Always use your most recent med list.               econazole nitrate 1 % cream     glucose blood test strip  Commonly known as:  ONETOUCH VERIO  Use as instructed to check blood sugar 2 times per day dx code 250.42     Insulin Lispro Prot & Lispro (75-25) 100 UNIT/ML Kwikpen  Commonly known as:  HUMALOG MIX 75/25 KWIKPEN  Inject 30-40 Units into the skin 2 (two) times daily before a meal.     insulin NPH-regular Human (70-30) 100 UNIT/ML injection  Commonly known as:  NOVOLIN 70/30 RELION  Inject 50 Units into the skin 3 (three) times daily.     INSULIN SYRINGE 1CC/31GX5/16" 31G X 5/16" 1 ML Misc  Use 3 needles per day     levothyroxine 50 MCG tablet  Commonly known as:  SYNTHROID, LEVOTHROID  Take 1 tablet (50 mcg total) by mouth daily.     lithium carbonate 300 MG capsule  Take 300 mg by mouth 2 (two) times daily. Takes 3 twice a day     loratadine 10 MG tablet  Commonly known as:  CLARITIN  Take 1 tablet (10 mg total) by mouth daily.     losartan 100 MG tablet  Commonly known as:  COZAAR  Take 1 tablet (100 mg total) by mouth daily.     lovastatin 20 MG tablet  Commonly known as:  MEVACOR  Take 1 tablet (20 mg total) by mouth at bedtime.     metFORMIN 1000 MG tablet  Commonly known as:  GLUCOPHAGE  Take 1 tablet (1,000 mg total) by mouth 2 (two) times daily with a meal.     omeprazole 20 MG capsule  Commonly known as:  PRILOSEC  Take 1 capsule (20 mg total) by mouth daily.     ONETOUCH DELICA LANCETS FINE Misc  Use to check blood sugars 2 times per day dx code 250.42     sertraline 50 MG tablet  Commonly known as:  ZOLOFT  Take 50 mg by mouth daily. Takes 3 every morning     terbinafine 250 MG tablet  Commonly known as:  LAMISIL     triamcinolone  cream 0.1 %  Commonly known as:  KENALOG  Apply 1 application topically 2 (two) times daily.     VICTOZA 18 MG/3ML Sopn  Generic drug:  Liraglutide  Inject 1.2 mg into the skin daily. Inject once daily at the same time     VIIBRYD 40 MG Tabs  Generic drug:  Vilazodone HCl  Take 40 mg by mouth daily.        Allergies: No Known Allergies  Past Medical History  Diagnosis Date  . Diabetes mellitus   . Hypertension   . Hyperlipidemia   . Bipolar II disorder - Managed by Marye Round [phone]) 05/03/2013  . Unspecified hypothyroidism 05/03/2013    Past Surgical History  Procedure Laterality Date  . Knee surgery      Family History  Problem Relation Age of Onset  . Diabetes Mother   . Hypertension Mother   . Heart disease Father   . Heart attack Father 70    Social History:  reports that he has never smoked. He does not have any smokeless tobacco history on file. He reports that he does not drink alcohol or use illicit drugs.    Review of Systems       Lipids: He has had marked increase in triglycerides previously. Currently only on 20 mg lovastatin       Lab Results  Component Value Date   CHOL 105 08/02/2013   HDL 25.40* 08/02/2013   LDLCALC 19 08/02/2013   LDLDIRECT 24.8 05/03/2013   TRIG 302.0* 08/02/2013   CHOLHDL 4 08/02/2013       Thyroid:  Has had mild hypothyroidism for a few years but not clear why he was taken off his 50 mcg supplement. His lithium dose has actually been increased recently . Does complain of easy fatigability  Lab Results  Component Value Date   TSH 0.66 12/06/2013        Diabetic neuropathy present.   His foot exam showed the following sensory findings: Patchy decrease in sensation in the toes especially right and decreased on the plantar surface also   LABS:  No visits with results within 1 Week(s) from this visit. Latest known visit with results is:  Appointment on 02/15/2014  Component Date Value Ref Range  Status  . Sodium 02/15/2014 139  135 - 145 mEq/L Final  . Potassium 02/15/2014 4.3  3.5 - 5.1 mEq/L Final  . Chloride 02/15/2014 105  96 - 112 mEq/L Final  . CO2 02/15/2014 24  19 - 32 mEq/L Final  . Glucose, Bld 02/15/2014 617* 70 - 99 mg/dL Final  . BUN 02/15/2014 12  6 - 23 mg/dL Final  . Creatinine, Ser 02/15/2014 1.2  0.4 - 1.5 mg/dL Final  . Calcium 02/15/2014 9.0  8.4 - 10.5 mg/dL Final  . GFR 02/15/2014 66.11  >60.00 mL/min Final  . Fructosamine 02/15/2014 472* 190 - 270 umol/L Final    Physical Examination:  BP 124/72 mmHg  Pulse 108  Temp(Src) 98 F (36.7 C)  Resp 16  Ht 5\' 11"  (1.803 m)  Wt 281 lb (127.461 kg)  BMI 39.21 kg/m2  SpO2 98%  Not indicated    ASSESSMENT/PLAN:   Diabetes type 2, uncontrolled     He has  significant hyperglycemia with  Current regimen of premixed insulin  As discussed in history of present illness he is  Taking 150 units of insulin without adequately control sugars Also sugars are quite variable with his regimen of premixed insulin and recently the highest after lunch Also has  Recently restarted  metformin  He may be having more  Hyperglycemia because of not taking Victoza also  Currently he is not able to afford brand-name medications.   recommendations:   For now he will continue taking the NovoLog 70/30 insulin since he is running out of her Humalog mix.   He will try  Taking the insulin 30 minutes before eating   To try to take more blood sugars throughout the day to help insulin adjustment.  He will increase the dose to 60 units at lunch and  Reduce the dose to 45 at supper.   Reviewed insulin adjustment and blood sugar targets.  He will need to make sure he gets protein at each meal and avoid delaying his meals especially lunch.   He was encouraged to start going to the [facility] where he has a free membership to start exercising ; he can do exercises that will not cause knee pain  Information given for indigent program  through [place] time over 50% of today's 25 minute visit   Gianfranco Araki 02/22/2014, 2:29 PM   Note: This office note was prepared with Dragon voice recognition system technology. Any transcriptional errors that result from this process are unintentional.

## 2014-03-14 ENCOUNTER — Other Ambulatory Visit: Payer: Self-pay | Admitting: Family Medicine

## 2014-03-21 ENCOUNTER — Encounter: Payer: Self-pay | Admitting: Family Medicine

## 2014-03-21 ENCOUNTER — Ambulatory Visit (INDEPENDENT_AMBULATORY_CARE_PROVIDER_SITE_OTHER): Payer: Commercial Managed Care - HMO | Admitting: Family Medicine

## 2014-03-21 VITALS — BP 130/90 | HR 87 | Temp 97.3°F | Ht 71.5 in | Wt 287.4 lb

## 2014-03-21 DIAGNOSIS — R0683 Snoring: Secondary | ICD-10-CM

## 2014-03-21 DIAGNOSIS — G4710 Hypersomnia, unspecified: Secondary | ICD-10-CM

## 2014-03-21 DIAGNOSIS — R400 Somnolence: Secondary | ICD-10-CM

## 2014-03-21 NOTE — Progress Notes (Signed)
Pre visit review using our clinic review tool, if applicable. No additional management support is needed unless otherwise documented below in the visit note. 

## 2014-03-21 NOTE — Patient Instructions (Signed)
-  We placed a referral for you as discussed for the sleep study. It usually takes about 1-2 weeks to process and schedule this referral. If you have not heard from [place] regarding this appointment in 2 weeks please contact our office.

## 2014-03-21 NOTE — Progress Notes (Signed)
HPI:  Matthew Barnes is a pleasant obese 58 yo M with MMP including sig psychiatric illness managed by his psychiatrist on multiple medication, wildly uncontrolled diabetes manage dby endocrinologist (unfortunately cost and failure to comply with lifestyle recs has resulted in poor control), HTN, HLD and hypothyroidism.  Here for an acute visit for:  Daytime somnolence: -chronically tired during the day for "many" years -reports for "many" years issues with daytime somnelence - occ falls asleep if sitting in a chair and almost has fallen asleep driving -report he and his psychiatrist read on line and he thinks this is his thyroid- but this has been checked by his by his endocrinologist and this is ok -he does snore, denies apneic spells, does not feel well rested in the morning, feels like sleeps ok  -he has been reluctant to do a sleep study - he has been told to get this before but refused -now willing to do sleep study and wants to see pulm if positive  ROS: See pertinent positives and negatives per HPI.  Past Medical History  Diagnosis Date  . Diabetes mellitus   . Hypertension   . Hyperlipidemia   . Bipolar II disorder - Managed by Matthew Barnes [phone]) 05/03/2013  . Unspecified hypothyroidism 05/03/2013    Past Surgical History  Procedure Laterality Date  . Knee surgery      Family History  Problem Relation Age of Onset  . Diabetes Mother   . Hypertension Mother   . Heart disease Father   . Heart attack Father 41    History   Social History  . Marital Status: Single    Spouse Name: N/A    Number of Children: N/A  . Years of Education: N/A   Social History Main Topics  . Smoking status: Never Smoker   . Smokeless tobacco: None  . Alcohol Use: No  . Drug Use: No  . Sexual Activity: No   Other Topics Concern  . None   Social History Narrative   Work or School: on disability for knee arthritis      Home Situation: lives alone      Spiritual  Beliefs:Christian      Lifestyle:no regular exercising; diet not great             Current outpatient prescriptions: glucose blood (ONETOUCH VERIO) test strip, Use as instructed to check blood sugar 2 times per day dx code 250.42, Disp: 100 each, Rfl: 12;  Insulin Lispro Prot & Lispro (HUMALOG MIX 75/25 KWIKPEN) (75-25) 100 UNIT/ML Kwikpen, Inject 30-40 Units into the skin 2 (two) times daily before a meal., Disp: 15 mL, Rfl: 1 Insulin Syringe-Needle U-100 (INSULIN SYRINGE 1CC/31GX5/16") 31G X 5/16" 1 ML MISC, Use 3 needles per day, Disp: 100 each, Rfl: 3;  levothyroxine (SYNTHROID, LEVOTHROID) 50 MCG tablet, Take 1 tablet (50 mcg total) by mouth daily., Disp: 30 tablet, Rfl: 3;  lithium carbonate 300 MG capsule, Take 300 mg by mouth 2 (two) times daily. Takes 3 twice a day, Disp: , Rfl:  loratadine (CLARITIN) 10 MG tablet, Take 1 tablet (10 mg total) by mouth daily., Disp: 30 tablet, Rfl: 11;  losartan (COZAAR) 100 MG tablet, Take 1 tablet (100 mg total) by mouth daily., Disp: 90 tablet, Rfl: 0;  lovastatin (MEVACOR) 20 MG tablet, Take 1 tablet (20 mg total) by mouth at bedtime., Disp: 90 tablet, Rfl: 0;  metFORMIN (GLUCOPHAGE) 1000 MG tablet, TAKE 1 TABLET TWICE DAILY WITH MEALS, Disp: 180 tablet, Rfl: 0 omeprazole (  PRILOSEC) 20 MG capsule, Take 1 capsule (20 mg total) by mouth daily., Disp: 90 capsule, Rfl: 3;  triamcinolone cream (KENALOG) 0.1 %, Apply 1 application topically 2 (two) times daily., Disp: 454 g, Rfl: 0  EXAM:  Filed Vitals:   03/21/14 1318  BP: 130/90  Pulse: 87  Temp: 97.3 F (36.3 C)    Body mass index is 39.53 kg/(m^2).  GENERAL: vitals reviewed and listed above, alert, obese, oriented, appears well hydrated and in no acute distress  HEENT: atraumatic, conjunttiva clear, no obvious abnormalities on inspection of external nose and ears, large neck size  NECK: no obvious masses on inspection  LUNGS: clear to auscultation bilaterally, no wheezes, rales or rhonchi,  good air movement  CV: HRRR, no peripheral edema  MS: moves all extremities without noticeable abnormality  PSYCH: pleasant and cooperative, no obvious depression or anxiety  ASSESSMENT AND PLAN:  Discussed the following assessment and plan:  Snoring - Plan: Ambulatory referral to Pulmonology  Somnolence, daytime - Plan: Ambulatory referral to Pulmonology  -we discussed possible serious and likely etiologies, workup and treatment, treatment risks and return precautions. This is not new and has been going on for > 5 years which likely eliminates any acute illness as cause. Thyroid has been checked on 2 occasions in the last year and was in normal range - reviewed with him so unlikely cause. OSA is a likely cause and he has refused sleep study in the past but is agreeable to this now. Order placed. Discussed other likely causes such as medications, narcolepsy, etc and advised to discuss these with his psychiatrist. -of course, we advised [name]  to return or notify a doctor immediately if symptoms worsen or persist or new concerns arise.  -Patient advised to return or notify a doctor immediately if symptoms worsen or persist or new concerns arise.  There are no Patient Instructions on file for this visit.   Colin Benton R.

## 2014-03-22 ENCOUNTER — Other Ambulatory Visit: Payer: Self-pay | Admitting: Family Medicine

## 2014-03-30 ENCOUNTER — Other Ambulatory Visit: Payer: Self-pay | Admitting: Endocrinology

## 2014-04-07 ENCOUNTER — Ambulatory Visit (INDEPENDENT_AMBULATORY_CARE_PROVIDER_SITE_OTHER): Payer: Commercial Managed Care - HMO | Admitting: Family Medicine

## 2014-04-07 ENCOUNTER — Encounter: Payer: Self-pay | Admitting: Family Medicine

## 2014-04-07 VITALS — BP 136/82 | HR 84 | Temp 97.8°F | Ht 71.5 in | Wt 288.9 lb

## 2014-04-07 DIAGNOSIS — M948X9 Other specified disorders of cartilage, unspecified sites: Secondary | ICD-10-CM

## 2014-04-07 NOTE — Progress Notes (Signed)
Pre visit review using our clinic review tool, if applicable. No additional management support is needed unless otherwise documented below in the visit note. 

## 2014-04-07 NOTE — Progress Notes (Signed)
HPI:  TTP in rib with coughing: -started 3 days ago -TTP in specific location in cartilage lower R ant chest wall, also notices brief sharp mild pain here when twists or with hard coughs or sneeze -denies: fevers, cough increased from usual, pain with activity relieved by rest, SOB, DOE, malaise, hemoptysis, palpitations  ROS: See pertinent positives and negatives per HPI.  Past Medical History  Diagnosis Date  . Diabetes mellitus   . Hypertension   . Hyperlipidemia   . Bipolar II disorder - Managed by Marye Round [phone]) 05/03/2013  . Unspecified hypothyroidism 05/03/2013    Past Surgical History  Procedure Laterality Date  . Knee surgery      Family History  Problem Relation Age of Onset  . Diabetes Mother   . Hypertension Mother   . Heart disease Father   . Heart attack Father 48    History   Social History  . Marital Status: Single    Spouse Name: N/A    Number of Children: N/A  . Years of Education: N/A   Social History Main Topics  . Smoking status: Never Smoker   . Smokeless tobacco: None  . Alcohol Use: No  . Drug Use: No  . Sexual Activity: No   Other Topics Concern  . None   Social History Narrative   Work or School: on disability for knee arthritis      Home Situation: lives alone      Spiritual Beliefs:Christian      Lifestyle:no regular exercising; diet not great             Current outpatient prescriptions: buPROPion (WELLBUTRIN) 100 MG tablet, Take 100 mg by mouth 2 (two) times daily., Disp: , Rfl: ;  glucose blood (ONETOUCH VERIO) test strip, Use as instructed to check blood sugar 2 times per day dx code 250.42, Disp: 100 each, Rfl: 12;  Insulin Lispro Prot & Lispro (HUMALOG MIX 75/25 KWIKPEN) (75-25) 100 UNIT/ML Kwikpen, Inject 30-40 Units into the skin 2 (two) times daily before a meal., Disp: 15 mL, Rfl: 1 Insulin Syringe-Needle U-100 (INSULIN SYRINGE 1CC/31GX5/16") 31G X 5/16" 1 ML MISC, Use 3 needles per day, Disp: 100  each, Rfl: 3;  levothyroxine (SYNTHROID, LEVOTHROID) 50 MCG tablet, TAKE ONE TABLET BY MOUTH ONCE DAILY, Disp: 30 tablet, Rfl: 3;  lithium carbonate 300 MG capsule, Take 300 mg by mouth 2 (two) times daily. Takes 3 twice a day, Disp: , Rfl:  loratadine (CLARITIN) 10 MG tablet, Take 1 tablet (10 mg total) by mouth daily., Disp: 30 tablet, Rfl: 11;  losartan (COZAAR) 100 MG tablet, TAKE 1 TABLET EVERY DAY, Disp: 90 tablet, Rfl: 0;  lovastatin (MEVACOR) 20 MG tablet, Take 1 tablet (20 mg total) by mouth at bedtime., Disp: 90 tablet, Rfl: 0;  metFORMIN (GLUCOPHAGE) 1000 MG tablet, TAKE 1 TABLET TWICE DAILY WITH MEALS, Disp: 180 tablet, Rfl: 0 omeprazole (PRILOSEC) 20 MG capsule, Take 1 capsule (20 mg total) by mouth daily., Disp: 90 capsule, Rfl: 3;  triamcinolone cream (KENALOG) 0.1 %, Apply 1 application topically 2 (two) times daily., Disp: 454 g, Rfl: 0  EXAM:  Filed Vitals:   04/07/14 1101  BP: 136/82  Pulse: 84  Temp: 97.8 F (36.6 C)    Body mass index is 39.74 kg/(m^2).  GENERAL: vitals reviewed and listed above, alert, oriented, appears well hydrated and in no acute distress  HEENT: atraumatic, conjunttiva clear, no obvious abnormalities on inspection of external nose and ears  NECK: no obvious masses  on inspection  LUNGS: clear to auscultation bilaterally, no wheezes, rales or rhonchi, good air movement  CV: HRRR, no peripheral edema  MS: moves all extremities without noticeable abnormality; TTP in cartilage L ant lower chest wall  PSYCH: pleasant and cooperative, no obvious depression or anxiety  ASSESSMENT AND PLAN:  Discussed the following assessment and plan:  Cartilage pain  -we discussed possible serious and likely etiologies, workup and treatment, treatment risks and return precautions - cc most likely, discussed serious and common pulm and cardiac etiologies (atypical presenstation) and offered CXR, EKG, stress test -after this discussion, Elmo opted for tx for cc,  declined other studies at this time -follow up advised as needed and for routine f/u - of course, we advised Purnell  to return or notify a doctor immediately if symptoms worsen or persist or new concerns arise.   -Patient advised to return or notify a doctor immediately if symptoms worsen or persist or new concerns arise.  Patient Instructions  Costochondritis Costochondritis, sometimes called Tietze syndrome, is a swelling and irritation (inflammation) of the tissue (cartilage) that connects your ribs with your breastbone (sternum). It causes pain in the chest and rib area. Costochondritis usually goes away on its own over time. It can take up to 6 weeks or longer to get better, especially if you are unable to limit your activities. CAUSES  Some cases of costochondritis have no known cause. Possible causes include:  Injury (trauma).  Exercise or activity such as lifting.  Severe coughing. SIGNS AND SYMPTOMS  Pain and tenderness in the chest and rib area.  Pain that gets worse when coughing or taking deep breaths.  Pain that gets worse with specific movements. DIAGNOSIS  Your health care provider will do a physical exam and ask about your symptoms. Chest X-rays or other tests may be done to rule out other problems. TREATMENT  Costochondritis usually goes away on its own over time. Your health care provider may prescribe medicine to help relieve pain. HOME CARE INSTRUCTIONS   Avoid exhausting physical activity. Try not to strain your ribs during normal activity. This would include any activities using chest, abdominal, and side muscles, especially if heavy weights are used.  Apply ice to the affected area for the first 2 days after the pain begins.  Put ice in a plastic bag.  Place a towel between your skin and the bag.  Leave the ice on for 20 minutes, 2-3 times a day.  Only take over-the-counter or prescription medicines as directed by your health care provider. SEEK MEDICAL  CARE IF:  You have redness or swelling at the rib joints. These are signs of infection.  Your pain does not go away despite rest or medicine. SEEK IMMEDIATE MEDICAL CARE IF:   Your pain increases or you are very uncomfortable.  You have shortness of breath or difficulty breathing.  You cough up blood.  You have worse chest pains, sweating, or vomiting.  You have a fever or persistent symptoms for more than 2-3 days.  You have a fever and your symptoms suddenly get worse. MAKE SURE YOU:   Understand these instructions.  Will watch your condition.  Will get help right away if you are not doing well or get worse. Document Released: 12/25/2004 Document Revised: 01/05/2013 Document Reviewed: 10/19/2012 [facility] Patient Information 2015 [place], [place]. This information is not intended to replace advice given to you by your health care provider. Make sure you discuss any questions you have with your health care  provider.      Colin Benton R.

## 2014-04-07 NOTE — Patient Instructions (Signed)
Costochondritis Costochondritis, sometimes called Tietze syndrome, is a swelling and irritation (inflammation) of the tissue (cartilage) that connects your ribs with your breastbone (sternum). It causes pain in the chest and rib area. Costochondritis usually goes away on its own over time. It can take up to 6 weeks or longer to get better, especially if you are unable to limit your activities. CAUSES  Some cases of costochondritis have no known cause. Possible causes include:  Injury (trauma).  Exercise or activity such as lifting.  Severe coughing. SIGNS AND SYMPTOMS  Pain and tenderness in the chest and rib area.  Pain that gets worse when coughing or taking deep breaths.  Pain that gets worse with specific movements. DIAGNOSIS  Your health care provider will do a physical exam and ask about your symptoms. Chest X-rays or other tests may be done to rule out other problems. TREATMENT  Costochondritis usually goes away on its own over time. Your health care provider may prescribe medicine to help relieve pain. HOME CARE INSTRUCTIONS   Avoid exhausting physical activity. Try not to strain your ribs during normal activity. This would include any activities using chest, abdominal, and side muscles, especially if heavy weights are used.  Apply ice to the affected area for the first 2 days after the pain begins.  Put ice in a plastic bag.  Place a towel between your skin and the bag.  Leave the ice on for 20 minutes, 2-3 times a day.  Only take over-the-counter or prescription medicines as directed by your health care provider. SEEK MEDICAL CARE IF:  You have redness or swelling at the rib joints. These are signs of infection.  Your pain does not go away despite rest or medicine. SEEK IMMEDIATE MEDICAL CARE IF:   Your pain increases or you are very uncomfortable.  You have shortness of breath or difficulty breathing.  You cough up blood.  You have worse chest pains,  sweating, or vomiting.  You have a fever or persistent symptoms for more than 2-3 days.  You have a fever and your symptoms suddenly get worse. MAKE SURE YOU:   Understand these instructions.  Will watch your condition.  Will get help right away if you are not doing well or get worse. Document Released: 12/25/2004 Document Revised: 01/05/2013 Document Reviewed: 10/19/2012 [employer] Patient Information 2015 [employer]. This information is not intended to replace advice given to you by your health care provider. Make sure you discuss any questions you have with your health care provider.  

## 2014-04-19 ENCOUNTER — Other Ambulatory Visit (INDEPENDENT_AMBULATORY_CARE_PROVIDER_SITE_OTHER): Payer: Commercial Managed Care - HMO

## 2014-04-19 DIAGNOSIS — IMO0002 Reserved for concepts with insufficient information to code with codable children: Secondary | ICD-10-CM

## 2014-04-19 DIAGNOSIS — E1165 Type 2 diabetes mellitus with hyperglycemia: Secondary | ICD-10-CM

## 2014-04-19 LAB — BASIC METABOLIC PANEL
BUN: 12 mg/dL (ref 6–23)
CO2: 28 mEq/L (ref 19–32)
Calcium: 9.4 mg/dL (ref 8.4–10.5)
Chloride: 102 mEq/L (ref 96–112)
Creatinine, Ser: 1.04 mg/dL (ref 0.40–1.50)
GFR: 77.93 mL/min (ref >60.00)
GLUCOSE: 303 mg/dL — AB (ref 70–99)
POTASSIUM: 3.8 meq/L (ref 3.5–5.1)
Sodium: 136 mEq/L (ref 135–145)

## 2014-04-19 LAB — HEMOGLOBIN A1C: Hgb A1c MFr Bld: 7.3 % — ABNORMAL HIGH (ref 4.6–6.5)

## 2014-04-24 ENCOUNTER — Encounter: Payer: Self-pay | Admitting: Endocrinology

## 2014-04-24 ENCOUNTER — Ambulatory Visit (INDEPENDENT_AMBULATORY_CARE_PROVIDER_SITE_OTHER): Payer: Commercial Managed Care - HMO | Admitting: Endocrinology

## 2014-04-24 VITALS — BP 155/88 | HR 100 | Temp 98.4°F | Resp 16 | Ht 71.5 in | Wt 295.6 lb

## 2014-04-24 DIAGNOSIS — E1169 Type 2 diabetes mellitus with other specified complication: Secondary | ICD-10-CM | POA: Diagnosis not present

## 2014-04-24 DIAGNOSIS — E785 Hyperlipidemia, unspecified: Secondary | ICD-10-CM

## 2014-04-24 DIAGNOSIS — IMO0002 Reserved for concepts with insufficient information to code with codable children: Secondary | ICD-10-CM

## 2014-04-24 DIAGNOSIS — E1165 Type 2 diabetes mellitus with hyperglycemia: Secondary | ICD-10-CM | POA: Diagnosis not present

## 2014-04-24 DIAGNOSIS — I10 Essential (primary) hypertension: Secondary | ICD-10-CM

## 2014-04-24 DIAGNOSIS — I152 Hypertension secondary to endocrine disorders: Secondary | ICD-10-CM

## 2014-04-24 DIAGNOSIS — E1159 Type 2 diabetes mellitus with other circulatory complications: Secondary | ICD-10-CM

## 2014-04-24 MED ORDER — VICTOZA 18 MG/3ML ~~LOC~~ SOPN
1.2000 mg | PEN_INJECTOR | Freq: Every day | SUBCUTANEOUS | Status: DC
Start: 2014-04-24 — End: 2016-04-04

## 2014-04-24 NOTE — Patient Instructions (Addendum)
Start VICTOZA injection as shown once daily at the same time of the day.  Dial the dose to 0.6 mg on the pen for the first 3-4 days.  You may inject in the stomach, thigh or arm. You may experience nausea in the first few days which usually goes away.  You will feel fullness of the stomach with starting the medication and should try to keep the portions at meals small. After 1 week increase the dose to 1.2mg  daily if no nausea present.   If any questions or concerns are present call the office or the [place] helpline at [phone]. Visit [email or web address].info/ for more useful information  Please check blood sugars at least half the time about 2 hours after any meal and 3 times per week on waking up. Please bring blood sugar monitor to each visit. Recommended blood sugar levels about 2 hours after meal is 140-180 and on waking up 90-130  Take insulin just before eating or 5-10 min before  Exercise as tolerted

## 2014-04-24 NOTE — Progress Notes (Signed)
Patient ID: Matthew Barnes, male   DOB: 05-05-1955, 59 y.o.   MRN: [id number]    Reason for Appointment: Followup for Type 2 Diabetes  Referring physician: Colin Benton  History of Present Illness:          Diagnosis: Type 2 diabetes mellitus, date of diagnosis:   2011       Past history:  He was started on metformin at diagnosis and apparently did have fairly good control initially However about a year later because of poor control he was given insulin in addition. He had been taking Lantus insulin until about 2/15, up to 80 units a day However he does not think his blood sugars  Were controlled with this Because of higher A1c of 13.1% he was referred here for diabetes management in 6/15; was started on glucose monitoring and insulin was changed from low dose Levemir to Humalog mix 70 units a day He was also started on Victoza  to help with blood sugar control and facilitate weight loss on his initial consultation  Recent history:   Previously when he was taking Victoza and premixed insulin Before breakfast and supper he was having good control in 9/15 with an A1c of 5.9.    However on his follow-up visit in 11/15 he had been quite irregular with his treatment regimen and blood sugars were markedly increased especially when he was not taking insulin because of cost. He also stopped taking Victoza because of the out-of-pocket expense but also metformin as he ran out and did not get it filled. He was seen in follow-up in late 11/15 and was advised to take larger doses of insulin with lunch and less at suppertime However he is taking the same dose of 50 units with every meal Has been taking his metformin as directed Although his blood sugars are overall much better he is checking blood sugars mostly in the mornings A1c is now reasonably good at 7.3 although his readings have been as low as 5.9   Current blood sugar patterns and problems:   fasting readings are fairly good about half the  time and he does not know why they are high on some days.  His blood sugar in the lab was over 300 and he is not sure if he took his insulin that day before lunch.  He is taking his insulin right after eating.  He thinks he is remembering this most of the time; is afraid of hypoglycemia  Starting to gain weight, he is not sure why  Not exercising  He still has not scheduled an appointment with dietitian as recommended  Oral hypoglycemic drugs the patient is taking are: Metformin 1 g twice a day      Side effects from medications have been: None INSULIN regimen is described as: Novolog mix 50 ac tid with syringe pc  Glucose monitoring:  done 2.4 times a day         Glucometer:  One Touch Verio    Blood Glucose readings from review of monitor as below, checking mostly in the morning  PRE-MEAL Breakfast Lunch  2-4 PM   6-10 PM  Overall  Glucose range:  93-176    111-133   92-219    Median:  115      117     Glycemic control:   Lab Results  Component Value Date   HGBA1C 7.3* 04/19/2014   HGBA1C 5.9 12/06/2013   HGBA1C 13.1* 08/02/2013   Lab Results  Component  Value Date   MICROALBUR 2.0* 05/03/2013   LDLCALC 19 08/02/2013   CREATININE 1.04 04/19/2014    Self-care: The diet that the patient has been following is: None, unable to control portions especially when depressed  Meals: 3 meals per day (dinner 6 pm-10 pm)         Exercise: unable to do much aerobic activity because of knee pain         Dietician visit: Most recent: never               Compliance with the medical regimen: Fair  Retinal exam: Most recent: 5/15.    Weight history: baseline 295  Wt Readings from Last 3 Encounters:  04/24/14 295 lb 9.6 oz (134.083 kg)  04/07/14 288 lb 14.4 oz (131.044 kg)  03/21/14 287 lb 6.4 oz (130.364 kg)      Medication List       This list is accurate as of: 04/24/14  1:36 PM.  Always use your most recent med list.               buPROPion 150 MG 24 hr tablet   Commonly known as:  WELLBUTRIN XL     glucose blood test strip  Commonly known as:  ONETOUCH VERIO  Use as instructed to check blood sugar 2 times per day dx code 250.42     INSULIN SYRINGE 1CC/31GX5/16" 31G X 5/16" 1 ML Misc  Use 3 needles per day     levothyroxine 50 MCG tablet  Commonly known as:  SYNTHROID, LEVOTHROID  TAKE ONE TABLET BY MOUTH ONCE DAILY     lithium carbonate 300 MG capsule  Take 300 mg by mouth 2 (two) times daily. Takes 3 twice a day     loratadine 10 MG tablet  Commonly known as:  CLARITIN  Take 1 tablet (10 mg total) by mouth daily.     losartan 100 MG tablet  Commonly known as:  COZAAR  TAKE 1 TABLET EVERY DAY     lovastatin 20 MG tablet  Commonly known as:  MEVACOR  Take 1 tablet (20 mg total) by mouth at bedtime.     metFORMIN 1000 MG tablet  Commonly known as:  GLUCOPHAGE  TAKE 1 TABLET TWICE DAILY WITH MEALS     NOVOLOG MIX 70/30 FLEXPEN [place]  Inject 30-40 Units into the skin 2 (two) times daily.     omeprazole 20 MG capsule  Commonly known as:  PRILOSEC  Take 1 capsule (20 mg total) by mouth daily.     triamcinolone cream 0.1 %  Commonly known as:  KENALOG  Apply 1 application topically 2 (two) times daily.        Allergies: No Known Allergies  Past Medical History  Diagnosis Date  . Diabetes mellitus   . Hypertension   . Hyperlipidemia   . Bipolar II disorder - Managed by Marye Round [phone]) 05/03/2013  . Unspecified hypothyroidism 05/03/2013    Past Surgical History  Procedure Laterality Date  . Knee surgery      Family History  Problem Relation Age of Onset  . Diabetes Mother   . Hypertension Mother   . Heart disease Father   . Heart attack Father 40    Social History:  reports that he has never smoked. He does not have any smokeless tobacco history on file. He reports that he does not drink alcohol or use illicit drugs.    Review of Systems   He has not had  difficulty with blood pressure control  but blood pressure is relatively higher today. He thinks that blood pressure has been otherwise well controlled.  This is followed by PCP      Lipids: He has had marked increase in triglycerides previously. Currently only on 20 mg lovastatin       Lab Results  Component Value Date   CHOL 105 08/02/2013   HDL 25.40* 08/02/2013   LDLCALC 19 08/02/2013   LDLDIRECT 24.8 05/03/2013   TRIG 302.0* 08/02/2013   CHOLHDL 4 08/02/2013       Thyroid:  Has had mild hypothyroidism for a few years but not clear why he was taken off his 50 mcg supplement. His lithium dose has actually been increased recently . Does complain of easy fatigability  Lab Results  Component Value Date   TSH 0.66 12/06/2013        Diabetic neuropathy present.   His foot exam  in 6/15 showed the following sensory findings: Patchy decrease in sensation in the toes especially right and decreased on the plantar surface also   LABS:  Appointment on 04/19/2014  Component Date Value Ref Range Status  . Hgb A1c MFr Bld 04/19/2014 7.3* 4.6 - 6.5 % Final   Glycemic Control Guidelines for People with Diabetes:Non Diabetic:  <6%Goal of Therapy: <7%Additional Action Suggested:  >8%   . Sodium 04/19/2014 136  135 - 145 mEq/L Final  . Potassium 04/19/2014 3.8  3.5 - 5.1 mEq/L Final  . Chloride 04/19/2014 102  96 - 112 mEq/L Final  . CO2 04/19/2014 28  19 - 32 mEq/L Final  . Glucose, Bld 04/19/2014 303* 70 - 99 mg/dL Final  . BUN 04/19/2014 12  6 - 23 mg/dL Final  . Creatinine, Ser 04/19/2014 1.04  0.40 - 1.50 mg/dL Final  . Calcium 04/19/2014 9.4  8.4 - 10.5 mg/dL Final  . GFR 04/19/2014 77.93  >60.00 mL/min Final    Physical Examination:  BP 155/88 mmHg  Pulse 100  Temp(Src) 98.4 F (36.9 C)  Resp 16  Ht 5' 11.5" (1.816 m)  Wt 295 lb 9.6 oz (134.083 kg)  BMI 40.66 kg/m2  SpO2 97%  No pedal edema    ASSESSMENT/PLAN:   Diabetes type 2, uncontrolled     He has better control of his diabetes with adding  metformin and taking consistent amounts of insulin with meals However he is gaining weight. As discussed in history he still has significant issues with insulin resistance, obesity and inconsistent control Not checking blood sugars after meals much and discussed importance of this; reminded him that his postprandial readings in the lab was over 300 Also discussed timing of insulin which he is taking postprandially. He had previously benefited from [place] but was not taking this because of cost; he is willing to try this again  Hypertension: He may be anxious today as his pulse is fasting blood pressure usually not as high   Recommendations:   For now he will continue taking the NovoLog 70/30 insulin the same doses   He will try taking the insulin at least 5-10 minutes before eating   To try to take more blood sugars after meals during the day to help insulin adjustment and modify meal planning if needed accordingly.  Discussed how to take Victoza and dosage  titration and possible side effects  He will continue metformin unchanged.  We will schedule an appointment with the dietitian  He will need to make sure he gets protein at each  meal and avoid delaying his meals especially lunch.   He was encouraged to start going to the [facility] where he can do water exercises  Try to monitor blood pressure at home also   Counseling time over 50% of today's 25 minute visit   Aleiah Mohammed 04/24/2014, 1:36 PM   Note: This office note was prepared with Dragon voice recognition system technology. Any transcriptional errors that result from this process are unintentional.

## 2014-04-28 ENCOUNTER — Institutional Professional Consult (permissible substitution): Payer: Commercial Managed Care - HMO | Admitting: Pulmonary Disease

## 2014-05-01 ENCOUNTER — Institutional Professional Consult (permissible substitution): Payer: Commercial Managed Care - HMO | Admitting: Pulmonary Disease

## 2014-05-04 ENCOUNTER — Encounter: Payer: Commercial Managed Care - HMO | Admitting: Dietician

## 2014-05-17 ENCOUNTER — Ambulatory Visit (INDEPENDENT_AMBULATORY_CARE_PROVIDER_SITE_OTHER): Payer: Commercial Managed Care - HMO | Admitting: Family Medicine

## 2014-05-17 ENCOUNTER — Encounter: Payer: Self-pay | Admitting: Family Medicine

## 2014-05-17 ENCOUNTER — Ambulatory Visit: Payer: Commercial Managed Care - HMO | Admitting: Family Medicine

## 2014-05-17 VITALS — BP 140/88 | HR 91 | Temp 98.5°F | Ht 71.5 in | Wt 288.8 lb

## 2014-05-17 DIAGNOSIS — J069 Acute upper respiratory infection, unspecified: Secondary | ICD-10-CM | POA: Diagnosis not present

## 2014-05-17 DIAGNOSIS — H6992 Unspecified Eustachian tube disorder, left ear: Secondary | ICD-10-CM

## 2014-05-17 MED ORDER — DOXYCYCLINE HYCLATE 100 MG PO TABS: 100.0000 mg | ORAL_TABLET | Freq: Two times a day (BID) | ORAL | Status: DC

## 2014-05-17 MED ORDER — FLUTICASONE PROPIONATE 50 MCG/ACT NA SUSP: 2.0000 | Freq: Every day | NASAL | Status: DC

## 2014-05-17 NOTE — Progress Notes (Signed)
HPI:  Cough: -started about 3 weeks - not better and maybe a little worse -nasal congestion, PND, sinus symptom, productive cough - cough has persisted, some dizziness when coughs -denies: fevers, SOB, wheezing, hemoptysis -has tried: ibuprofen, nyquil -sick contacts: no -diabetes is much improved per his report   ROS: See pertinent positives and negatives per HPI.  Past Medical History  Diagnosis Date  . Diabetes mellitus   . Hypertension   . Hyperlipidemia   . Bipolar II disorder - Managed by Marye Round [phone]) 05/03/2013  . Unspecified hypothyroidism 05/03/2013    Past Surgical History  Procedure Laterality Date  . Knee surgery      Family History  Problem Relation Age of Onset  . Diabetes Mother   . Hypertension Mother   . Heart disease Father   . Heart attack Father 4    History   Social History  . Marital Status: Single    Spouse Name: N/A  . Number of Children: N/A  . Years of Education: N/A   Social History Main Topics  . Smoking status: Never Smoker   . Smokeless tobacco: Not on file  . Alcohol Use: No  . Drug Use: No  . Sexual Activity: No   Other Topics Concern  . None   Social History Narrative   Work or School: on disability for knee arthritis      Home Situation: lives alone      Spiritual Beliefs:Christian      Lifestyle:no regular exercising; diet not great              Current outpatient prescriptions:  .  buPROPion (WELLBUTRIN XL) 150 MG 24 hr tablet, , Disp: , Rfl:  .  glucose blood (ONETOUCH VERIO) test strip, Use as instructed to check blood sugar 2 times per day dx code 250.42, Disp: 100 each, Rfl: 12 .  Insulin Aspart Prot & Aspart (NOVOLOG MIX 70/30 FLEXPEN [place]), Inject 30-40 Units into the skin 2 (two) times daily., Disp: , Rfl:  .  Insulin Syringe-Needle U-100 (INSULIN SYRINGE 1CC/31GX5/16") 31G X 5/16" 1 ML MISC, Use 3 needles per day, Disp: 100 each, Rfl: 3 .  levothyroxine (SYNTHROID, LEVOTHROID) 50 MCG  tablet, TAKE ONE TABLET BY MOUTH ONCE DAILY, Disp: 30 tablet, Rfl: 3 .  lithium carbonate 300 MG capsule, Take 300 mg by mouth 2 (two) times daily. Takes 3 twice a day, Disp: , Rfl:  .  loratadine (CLARITIN) 10 MG tablet, Take 1 tablet (10 mg total) by mouth daily., Disp: 30 tablet, Rfl: 11 .  losartan (COZAAR) 100 MG tablet, TAKE 1 TABLET EVERY DAY, Disp: 90 tablet, Rfl: 0 .  lovastatin (MEVACOR) 20 MG tablet, Take 1 tablet (20 mg total) by mouth at bedtime., Disp: 90 tablet, Rfl: 0 .  metFORMIN (GLUCOPHAGE) 1000 MG tablet, TAKE 1 TABLET TWICE DAILY WITH MEALS, Disp: 180 tablet, Rfl: 0 .  omeprazole (PRILOSEC) 20 MG capsule, Take 1 capsule (20 mg total) by mouth daily., Disp: 90 capsule, Rfl: 3 .  VICTOZA 18 MG/3ML SOPN, Inject 0.2 mLs (1.2 mg total) into the skin daily. Inject once daily at the same time, Disp: 2 pen, Rfl: 3 .  doxycycline (VIBRA-TABS) 100 MG tablet, Take 1 tablet (100 mg total) by mouth 2 (two) times daily., Disp: 20 tablet, Rfl: 0 .  fluticasone (FLONASE) 50 MCG/ACT nasal spray, Place 2 sprays into both nostrils daily., Disp: 16 g, Rfl: 0  EXAM:  Filed Vitals:   05/17/14 1045  BP: 140/88  Pulse: 91  Temp: 98.5 F (36.9 C)    Body mass index is 39.72 kg/(m^2).  GENERAL: vitals reviewed and listed above, alert, oriented, appears well hydrated and in no acute distress  HEENT: atraumatic, conjunttiva clear, no obvious abnormalities on inspection of external nose and ears, normal appearance of ear canals and TMs, clear nasal congestion, mild post oropharyngeal erythema with PND, no tonsillar edema or exudate, no sinus TTP  NECK: no obvious masses on inspection  LUNGS: clear to auscultation bilaterally, no wheezes, rales or rhonchi, good air movement  CV: HRRR, no peripheral edema  MS: moves all extremities without noticeable abnormality  PSYCH: pleasant and cooperative, no obvious depression or anxiety  ASSESSMENT AND PLAN:  Discussed the following assessment  and plan:  Acute upper respiratory infection - Plan: doxycycline (VIBRA-TABS) 100 MG tablet, fluticasone (FLONASE) 50 MCG/ACT nasal spray  Eustachian tube disorder, left - Plan: fluticasone (FLONASE) 50 MCG/ACT nasal spray  --we discussed possible serious and likely etiologies, workup and treatment, treatment risks and return precautions - likely VURI, but if not improving given length of symptoms and not improving abx printed for in case developing bacterial sinusitis  -of course, we advised [place]  to return or notify a doctor immediately if symptoms worsen or persist or new concerns arise.   -Patient advised to return or notify a doctor immediately if symptoms worsen or persist or new concerns arise.  Patient Instructions  -flonase 2 sprays each nostril for 21 days  -AFRIN (over the counter) twice daily for 3 days and then STOP  -if not feeling better or worsening in 3-4 days start and complete the antibiotic - otherwise please shred   -follow up if worsening, new symptoms or not improving      KIM, HANNAH R.

## 2014-05-17 NOTE — Progress Notes (Signed)
Pre visit review using our clinic review tool, if applicable. No additional management support is needed unless otherwise documented below in the visit note. 

## 2014-05-17 NOTE — Patient Instructions (Signed)
-  flonase 2 sprays each nostril for 21 days  -AFRIN (over the counter) twice daily for 3 days and then STOP  -if not feeling better or worsening in 3-4 days start and complete the antibiotic - otherwise please shred   -follow up if worsening, new symptoms or not improving

## 2014-05-18 ENCOUNTER — Ambulatory Visit: Payer: Commercial Managed Care - HMO | Admitting: Family Medicine

## 2014-05-18 DIAGNOSIS — F319 Bipolar disorder, unspecified: Secondary | ICD-10-CM | POA: Diagnosis not present

## 2014-06-07 ENCOUNTER — Other Ambulatory Visit: Payer: Self-pay | Admitting: *Deleted

## 2014-06-07 DIAGNOSIS — E785 Hyperlipidemia, unspecified: Secondary | ICD-10-CM

## 2014-06-07 MED ORDER — LOSARTAN POTASSIUM 100 MG PO TABS: 100.0000 mg | ORAL_TABLET | Freq: Every day | ORAL | Status: DC

## 2014-06-07 MED ORDER — METFORMIN HCL 1000 MG PO TABS: 1000.0000 mg | ORAL_TABLET | Freq: Two times a day (BID) | ORAL | Status: DC

## 2014-06-07 MED ORDER — LOVASTATIN 20 MG PO TABS: 20.0000 mg | ORAL_TABLET | Freq: Every day | ORAL | Status: DC

## 2014-06-15 DIAGNOSIS — F319 Bipolar disorder, unspecified: Secondary | ICD-10-CM | POA: Diagnosis not present

## 2014-06-16 ENCOUNTER — Ambulatory Visit (INDEPENDENT_AMBULATORY_CARE_PROVIDER_SITE_OTHER): Payer: Commercial Managed Care - HMO | Admitting: Family Medicine

## 2014-06-16 VITALS — BP 128/78 | HR 111 | Temp 98.3°F | Ht 71.5 in | Wt 294.7 lb

## 2014-06-16 DIAGNOSIS — G629 Polyneuropathy, unspecified: Secondary | ICD-10-CM | POA: Diagnosis not present

## 2014-06-16 DIAGNOSIS — E1142 Type 2 diabetes mellitus with diabetic polyneuropathy: Secondary | ICD-10-CM

## 2014-06-16 NOTE — Progress Notes (Signed)
NOTE: computer system down today - see scanned sheet for full SOAP note and details.  Matthew Barnes is a 59 yo M with PMH significant for uncontrolled diabetes mellitis currently followed closely by his endocrinologist, Bipolar disorder under the care of his psychiatrist, GERD, HTN, HLD and Hypothyroidism here for and acute visit for:  Cough: -he reported a cough for several weeks in February with VURI and was advised to use nasal decongestants, ICS for the PND, a CXR was ordered - but he did not persue -today reports: persistent PND and cough - he did not get CXR and did not do the ICS -denies: SOB, wheezing, heartburn, dysphagia, hemoptysis, heartburn, acid - on PPI,   Exam normal except for clear rhinorrhea with PND and boggy turbinates.  Advised again of common and serious causes of chronic cough. He is on PPI. Advised again that he get CXR and try INS as suspect PND the cause of his cough. Advised if this does not work to call in 2-3 week and will advised Eval with pulm. He agreed to this plan.

## 2014-06-20 ENCOUNTER — Encounter: Payer: Self-pay | Admitting: Family Medicine

## 2014-06-30 DIAGNOSIS — F319 Bipolar disorder, unspecified: Secondary | ICD-10-CM | POA: Diagnosis not present

## 2014-07-07 ENCOUNTER — Ambulatory Visit (INDEPENDENT_AMBULATORY_CARE_PROVIDER_SITE_OTHER): Payer: Self-pay | Admitting: Family Medicine

## 2014-07-07 DIAGNOSIS — R69 Illness, unspecified: Secondary | ICD-10-CM

## 2014-07-07 NOTE — Progress Notes (Signed)
  NO SHOW/LATE CANCEL  Matthew Barnes is a 59 yo M with PMH significant for uncontrolled diabetes mellitis currently followed closely by his endocrinologist, Bipolar disorder under the care of his psychiatrist, GERD, HTN, HLD and Hypothyroidism.

## 2014-07-24 ENCOUNTER — Other Ambulatory Visit (INDEPENDENT_AMBULATORY_CARE_PROVIDER_SITE_OTHER): Payer: Commercial Managed Care - HMO

## 2014-07-24 DIAGNOSIS — E1165 Type 2 diabetes mellitus with hyperglycemia: Secondary | ICD-10-CM

## 2014-07-24 DIAGNOSIS — E785 Hyperlipidemia, unspecified: Secondary | ICD-10-CM | POA: Diagnosis not present

## 2014-07-24 DIAGNOSIS — IMO0002 Reserved for concepts with insufficient information to code with codable children: Secondary | ICD-10-CM

## 2014-07-24 LAB — LIPID PANEL
CHOL/HDL RATIO: 4
CHOLESTEROL: 88 mg/dL (ref 0–200)
HDL: 23.7 mg/dL — AB (ref >39.00)
NonHDL: 64.3
Triglycerides: 223 mg/dL — ABNORMAL HIGH (ref 0.0–149.0)
VLDL: 44.6 mg/dL — ABNORMAL HIGH (ref 0.0–40.0)

## 2014-07-24 LAB — BASIC METABOLIC PANEL
BUN: 10 mg/dL (ref 6–23)
CO2: 28 mEq/L (ref 19–32)
CREATININE: 0.98 mg/dL (ref 0.40–1.50)
Calcium: 9.3 mg/dL (ref 8.4–10.5)
Chloride: 105 mEq/L (ref 96–112)
GFR: 83.39 mL/min (ref >60.00)
GLUCOSE: 146 mg/dL — AB (ref 70–99)
Potassium: 3.7 mEq/L (ref 3.5–5.1)
Sodium: 137 mEq/L (ref 135–145)

## 2014-07-24 LAB — LDL CHOLESTEROL, DIRECT: Direct LDL: 43 mg/dL

## 2014-07-24 LAB — HEMOGLOBIN A1C: Hgb A1c MFr Bld: 5.5 % (ref 4.6–6.5)

## 2014-07-26 ENCOUNTER — Other Ambulatory Visit: Payer: Self-pay | Admitting: Endocrinology

## 2014-07-27 ENCOUNTER — Ambulatory Visit: Payer: Commercial Managed Care - HMO | Admitting: Endocrinology

## 2014-07-28 DIAGNOSIS — F319 Bipolar disorder, unspecified: Secondary | ICD-10-CM | POA: Diagnosis not present

## 2014-08-16 ENCOUNTER — Encounter: Payer: Self-pay | Admitting: Endocrinology

## 2014-08-16 ENCOUNTER — Ambulatory Visit (INDEPENDENT_AMBULATORY_CARE_PROVIDER_SITE_OTHER): Payer: Commercial Managed Care - HMO | Admitting: Endocrinology

## 2014-08-16 VITALS — BP 150/84 | HR 97 | Temp 98.1°F | Resp 16 | Ht 71.5 in | Wt 300.2 lb

## 2014-08-16 DIAGNOSIS — IMO0002 Reserved for concepts with insufficient information to code with codable children: Secondary | ICD-10-CM

## 2014-08-16 DIAGNOSIS — E1165 Type 2 diabetes mellitus with hyperglycemia: Secondary | ICD-10-CM

## 2014-08-16 DIAGNOSIS — E038 Other specified hypothyroidism: Secondary | ICD-10-CM

## 2014-08-16 DIAGNOSIS — E785 Hyperlipidemia, unspecified: Secondary | ICD-10-CM | POA: Diagnosis not present

## 2014-08-16 LAB — POCT URINALYSIS DIPSTICK
Bilirubin, UA: NEGATIVE
Blood, UA: NEGATIVE
Glucose, UA: NEGATIVE
KETONES UA: NEGATIVE
LEUKOCYTES UA: NEGATIVE
Nitrite, UA: NEGATIVE
PROTEIN UA: NEGATIVE
Spec Grav, UA: <=1.005
UROBILINOGEN UA: 0.2
pH, UA: 7

## 2014-08-16 LAB — MICROALBUMIN / CREATININE URINE RATIO
Creatinine,U: 44 mg/dL
Microalb Creat Ratio: 1.6 mg/g (ref 0.0–30.0)
Microalb, Ur: <0.7 mg/dL (ref 0.0–1.9)

## 2014-08-16 NOTE — Patient Instructions (Addendum)
Check blood sugars on waking up ..  .. times a week Also check blood sugars about 2 hours after a meal and do this after different meals by rotation  Recommended blood sugar levels on waking up is 90-130 and about 2 hours after meal is 140-180 Please bring blood sugar monitor to each visit.  Walk the parking lot 2-3x daily or whenever you can  Reduce pm insulin to 45 before supper

## 2014-08-16 NOTE — Progress Notes (Signed)
Patient ID: Matthew Barnes, male   DOB: 02-Apr-1955, 59 y.o.   MRN: [id number]    Reason for Appointment: Followup for Type 2 Diabetes  Referring physician: Colin Benton  History of Present Illness:          Diagnosis: Type 2 diabetes mellitus, date of diagnosis:   2011       Past history:  He was started on metformin at diagnosis and apparently did have fairly good control initially However about a year later because of poor control he was given insulin in addition. He had been taking Lantus insulin until about 2/15, up to 80 units a day However he does not think his blood sugars  Were controlled with this Because of higher A1c of 13.1% he was referred here for diabetes management in 6/15; was started on glucose monitoring and insulin was changed from low dose Levemir to Humalog mix 70 units a day He was also started on Victoza  to help with blood sugar control and facilitate weight loss on his initial consultation  Recent history:   A1c is now excellent at 5.5, previously was higher at 7.3 although his readings have been as low as 5.9 A1c appears to be lower than expected for his blood sugars again This is most likely from adding Victoza again since his last visit which he is now able to afford and is tolerating 1.2 mg daily Also previously he would have blood sugars as high as 300   Current blood sugar patterns, management of diabetes and problems:   fasting readings are fairly good although he has checked only a few readings.  The last 2 readings are below 100  He thinks he has had some tendency to hypoglycemia overnight and has felt low twice during the night but does not check his sugar at that time  Still checking blood sugars somewhat sporadically later in the day and mostly before supper.  These are variable and as high as 210  He thinks he was still sometimes have snacks during the day or night when he is hungry and this may cause higher readings.    Has not significantly  improved his diet even with starting Victoza and has gained weightper  He still not motivated to exercise  He still has not scheduled an appointment with dietitian as recommended  Oral hypoglycemic drugs the patient is taking are: Metformin 1 g twice a day      Side effects from medications have been: None INSULIN regimen is described as: Novolog mix 50 ac bid-tid with syringe pc  Glucose monitoring:  done 2.4 times a day         Glucometer:  One Touch Verio    Blood Glucose readings from review of monitor as below, checking mostly in the morning  PRE-MEAL Breakfast 3 PM Dinner Bedtime Overall  Glucose range: 94-138 128 73-210     median: 107  130  118   Glycemic control:   Lab Results  Component Value Date   HGBA1C 5.5 07/24/2014   HGBA1C 7.3* 04/19/2014   HGBA1C 5.9 12/06/2013   Lab Results  Component Value Date   MICROALBUR <0.7 08/16/2014   LDLCALC 19 08/02/2013   CREATININE 0.98 07/24/2014    Self-care: The diet that the patient has been following is: None, unable to control portions when depressed  Meals: 3 meals per day (dinner 6 pm-10 pm)         Exercise: not  doing  Dietician visit: Most recent: never               Compliance with the medical regimen: Fair  Retinal exam: Most recent: 5/15.    Weight history: baseline 295  Wt Readings from Last 3 Encounters:  08/16/14 300 lb 3.2 oz (136.17 kg)  06/16/14 294 lb 11.2 oz (133.675 kg)  05/17/14 288 lb 12.8 oz (130.999 kg)      Medication List       This list is accurate as of: 08/16/14  2:43 PM.  Always use your most recent med list.               fluticasone 50 MCG/ACT nasal spray  Commonly known as:  FLONASE  Place 2 sprays into both nostrils daily.     glucose blood test strip  Commonly known as:  ONETOUCH VERIO  Use as instructed to check blood sugar 2 times per day dx code 250.42     INSULIN SYRINGE 1CC/31GX5/16" 31G X 5/16" 1 ML Misc  Use 3 needles per day     lamoTRIgine 25 MG  tablet  Commonly known as:  LAMICTAL     levothyroxine 50 MCG tablet  Commonly known as:  SYNTHROID, LEVOTHROID  TAKE ONE TABLET BY MOUTH ONCE DAILY     lithium carbonate 300 MG capsule  Take 300 mg by mouth 2 (two) times daily. Takes 3 twice a day     loratadine 10 MG tablet  Commonly known as:  CLARITIN  Take 1 tablet (10 mg total) by mouth daily.     losartan 100 MG tablet  Commonly known as:  COZAAR  Take 1 tablet (100 mg total) by mouth daily.     lovastatin 20 MG tablet  Commonly known as:  MEVACOR  Take 1 tablet (20 mg total) by mouth at bedtime.     metFORMIN 1000 MG tablet  Commonly known as:  GLUCOPHAGE  Take 1 tablet (1,000 mg total) by mouth 2 (two) times daily with a meal.     NOVOLIN 70/30 RELION (70-30) 100 UNIT/ML injection  Generic drug:  insulin NPH-regular Human  INJECT 50 UNITS SUBCUTANEOUSLY THREE TIMES DAILY     NOVOLOG MIX 70/30 FLEXPEN [place]  Inject 50 Units into the skin 2 (two) times daily.     omeprazole 20 MG capsule  Commonly known as:  PRILOSEC  Take 1 capsule (20 mg total) by mouth daily.     VICTOZA 18 MG/3ML Sopn  Generic drug:  Liraglutide  Inject 0.2 mLs (1.2 mg total) into the skin daily. Inject once daily at the same time        Allergies: No Known Allergies  Past Medical History  Diagnosis Date  . Diabetes mellitus   . Hypertension   . Hyperlipidemia   . Bipolar II disorder - Managed by Marye Round [phone]) 05/03/2013  . Unspecified hypothyroidism 05/03/2013    Past Surgical History  Procedure Laterality Date  . Knee surgery      Family History  Problem Relation Age of Onset  . Diabetes Mother   . Hypertension Mother   . Heart disease Father   . Heart attack Father 61    Social History:  reports that he has never smoked. He does not have any smokeless tobacco history on file. He reports that he does not drink alcohol or use illicit drugs.    Review of Systems   HYPERTENSION: He usually has white coat  syndrome and blood pressure usually high  when he first comes in even when he goes to his psychiatrist Currently taking losartan 100 mg Followed by PCP periodically Blood pressure was fairly good with PCP in March      Lipids: He has had marked increase in triglycerides previously. Currently only on 20 mg lovastatin       Lab Results  Component Value Date   CHOL 88 07/24/2014   HDL 23.70* 07/24/2014   LDLCALC 19 08/02/2013   LDLDIRECT 43.0 07/24/2014   TRIG 223.0* 07/24/2014   CHOLHDL 4 07/24/2014       Thyroid:  Has had mild hypothyroidism for a few years on his 50 mcg supplement. His lithium dose has been continued.    Lab Results  Component Value Date   TSH 0.66 12/06/2013        Diabetic neuropathy present.   His foot exam  in 6/15 showed the following sensory findings: Patchy decrease in sensation in the toes especially right and decreased on the plantar surface also   LABS:  Office Visit on 08/16/2014  Component Date Value Ref Range Status  . Microalb, Ur 08/16/2014 <0.7  0.0 - 1.9 mg/dL Final  . Creatinine,U 08/16/2014 44.0   Final  . Microalb Creat Ratio 08/16/2014 1.6  0.0 - 30.0 mg/g Final  . Color, UA 08/16/2014 Yellow   Final  . Clarity, UA 08/16/2014 Clear   Final  . Glucose, UA 08/16/2014 Neg   Final  . Bilirubin, UA 08/16/2014 Neg   Final  . Ketones, UA 08/16/2014 Neg   Final  . Spec Grav, UA 08/16/2014 <=1.005   Final  . Blood, UA 08/16/2014 Neg   Final  . pH, UA 08/16/2014 7.0   Final  . Protein, UA 08/16/2014 Neg   Final  . Urobilinogen, UA 08/16/2014 0.2   Final  . Nitrite, UA 08/16/2014 Neg   Final  . Leukocytes, UA 08/16/2014 Negative   Final    Physical Examination:  BP 162/106 mmHg  Pulse 97  Temp(Src) 98.1 F (36.7 C)  Resp 16  Ht 5' 11.5" (1.816 m)  Wt 300 lb 3.2 oz (136.17 kg)  BMI 41.29 kg/m2  SpO2 97%  Repeat blood pressure 150/84 No edema    ASSESSMENT/PLAN:   Diabetes type 2, uncontrolled     He has better control of  his diabetes with adding Victoza and metformin to his insulin regimen However he is gaining weight. This may be related to his difficulty controlling his appetite because of his bipolar disorder and taking psychotropic drugs also like Lamictal As discussed in history in detail he has benefited from [place] with lower insulin requirement, more stable blood sugars especially in the morning Also starting to get low sugars overnight at times although he does not monitor when he has symptoms He does need better management of his exercise and diet and discussed need for weight loss  Hypertension: He may be anxious today as blood pressure is relatively high but better on the second measurement Continue follow-up with PCP  HYPOTHYROIDISM: We will need follow-up levels on his next visit   Recommendations:   For now he will reduce his evening dose by at least 5 units to avoid overnight hypoglycemia  He will try to check blood sugars after his evening meal which he is not doing  Encouraged him to cut back on snacks during the day  He will try to walk daily even while he is at work and can walk in the parking lot when he is  driving from place to place   He will try taking the insulin at least 5-10 minutes before eating   Unless he has consistently high readings before supper he does not need to take insulin at lunchtime also  Patient Instructions  Check blood sugars on waking up ..  .. times a week Also check blood sugars about 2 hours after a meal and do this after different meals by rotation  Recommended blood sugar levels on waking up is 90-130 and about 2 hours after meal is 140-180 Please bring blood sugar monitor to each visit.  Walk the parking lot 2-3x daily or whenever you can  Reduce pm insulin to 45 before supper     Counseling time over 50% of today's 25 minute visit on above subjects   Matthew Barnes 08/16/2014, 2:43 PM   Note: This office note was prepared with [employer]. Any transcriptional errors that result from this process are unintentional.

## 2014-08-17 DIAGNOSIS — B354 Tinea corporis: Secondary | ICD-10-CM | POA: Diagnosis not present

## 2014-08-17 LAB — HM DIABETES EYE EXAM

## 2014-08-18 ENCOUNTER — Other Ambulatory Visit (INDEPENDENT_AMBULATORY_CARE_PROVIDER_SITE_OTHER): Payer: Commercial Managed Care - HMO

## 2014-08-18 DIAGNOSIS — F319 Bipolar disorder, unspecified: Secondary | ICD-10-CM | POA: Diagnosis not present

## 2014-08-18 DIAGNOSIS — E1169 Type 2 diabetes mellitus with other specified complication: Secondary | ICD-10-CM

## 2014-08-18 DIAGNOSIS — E1159 Type 2 diabetes mellitus with other circulatory complications: Secondary | ICD-10-CM

## 2014-08-18 DIAGNOSIS — E785 Hyperlipidemia, unspecified: Secondary | ICD-10-CM

## 2014-08-18 DIAGNOSIS — I152 Hypertension secondary to endocrine disorders: Secondary | ICD-10-CM

## 2014-08-18 DIAGNOSIS — I10 Essential (primary) hypertension: Secondary | ICD-10-CM | POA: Diagnosis not present

## 2014-08-18 LAB — CBC WITH DIFFERENTIAL/PLATELET
BASOS PCT: 0.4 % (ref 0.0–3.0)
Basophils Absolute: 0 10*3/uL (ref 0.0–0.1)
Eosinophils Absolute: 0.2 10*3/uL (ref 0.0–0.7)
Eosinophils Relative: 3.7 % (ref 0.0–5.0)
HCT: 39.9 % (ref 39.0–52.0)
Hemoglobin: 13.5 g/dL (ref 13.0–17.0)
LYMPHS PCT: 21.5 % (ref 12.0–46.0)
Lymphs Abs: 1.2 10*3/uL (ref 0.7–4.0)
MCHC: 33.8 g/dL (ref 30.0–36.0)
MCV: 80.3 fl (ref 78.0–100.0)
MONOS PCT: 6 % (ref 3.0–12.0)
Monocytes Absolute: 0.3 10*3/uL (ref 0.1–1.0)
NEUTROS PCT: 68.4 % (ref 43.0–77.0)
Neutro Abs: 3.8 10*3/uL (ref 1.4–7.7)
Platelets: 139 10*3/uL — ABNORMAL LOW (ref 150.0–400.0)
RBC: 4.97 Mil/uL (ref 4.22–5.81)
RDW: 14.4 % (ref 11.5–15.5)
WBC: 5.5 10*3/uL (ref 4.0–10.5)

## 2014-08-18 LAB — COMPREHENSIVE METABOLIC PANEL
ALK PHOS: 39 U/L (ref 39–117)
ALT: 24 U/L (ref 0–53)
AST: 26 U/L (ref 0–37)
Albumin: 3.9 g/dL (ref 3.5–5.2)
BUN: 11 mg/dL (ref 6–23)
CO2: 28 mEq/L (ref 19–32)
Calcium: 9.2 mg/dL (ref 8.4–10.5)
Chloride: 105 mEq/L (ref 96–112)
Creatinine, Ser: 1 mg/dL (ref 0.40–1.50)
GFR: 81.45 mL/min (ref >60.00)
GLUCOSE: 128 mg/dL — AB (ref 70–99)
Potassium: 3.7 mEq/L (ref 3.5–5.1)
Sodium: 139 mEq/L (ref 135–145)
Total Bilirubin: 0.5 mg/dL (ref 0.2–1.2)
Total Protein: 6.4 g/dL (ref 6.0–8.3)

## 2014-08-21 ENCOUNTER — Other Ambulatory Visit: Payer: Self-pay | Admitting: Family Medicine

## 2014-08-22 ENCOUNTER — Encounter: Payer: Self-pay | Admitting: Endocrinology

## 2014-08-25 DIAGNOSIS — F319 Bipolar disorder, unspecified: Secondary | ICD-10-CM | POA: Diagnosis not present

## 2014-09-04 ENCOUNTER — Encounter: Payer: Self-pay | Admitting: Family Medicine

## 2014-09-04 ENCOUNTER — Ambulatory Visit (INDEPENDENT_AMBULATORY_CARE_PROVIDER_SITE_OTHER): Payer: Commercial Managed Care - HMO | Admitting: Family Medicine

## 2014-09-04 VITALS — BP 140/96 | HR 94 | Temp 97.8°F | Ht 71.5 in | Wt 296.4 lb

## 2014-09-04 DIAGNOSIS — F319 Bipolar disorder, unspecified: Secondary | ICD-10-CM | POA: Diagnosis not present

## 2014-09-04 DIAGNOSIS — R350 Frequency of micturition: Secondary | ICD-10-CM

## 2014-09-04 DIAGNOSIS — Z1211 Encounter for screening for malignant neoplasm of colon: Secondary | ICD-10-CM

## 2014-09-04 LAB — BASIC METABOLIC PANEL
BUN: 9 mg/dL (ref 6–23)
CALCIUM: 9.6 mg/dL (ref 8.4–10.5)
CHLORIDE: 105 meq/L (ref 96–112)
CO2: 28 mEq/L (ref 19–32)
Creatinine, Ser: 1.04 mg/dL (ref 0.40–1.50)
GFR: 77.83 mL/min (ref >60.00)
GLUCOSE: 83 mg/dL (ref 70–99)
Potassium: 4.1 mEq/L (ref 3.5–5.1)
Sodium: 138 mEq/L (ref 135–145)

## 2014-09-04 LAB — PSA: PSA: 1.9 ng/mL (ref 0.10–4.00)

## 2014-09-04 LAB — TSH: TSH: 1.85 u[IU]/mL (ref 0.35–4.50)

## 2014-09-04 NOTE — Patient Instructions (Signed)
BEFORE YOU LEAVE: -labs -follow up in 3-4 months or as scheduled  If labs ok advise eval with your urologist for you urinary symptoms  Follow up as needed  Complete the stool cards and return  Schedule diabetic foot exam with your podiatrist

## 2014-09-04 NOTE — Progress Notes (Signed)
Pre visit review using our clinic review tool, if applicable. No additional management support is needed unless otherwise documented below in the visit note. 

## 2014-09-04 NOTE — Progress Notes (Signed)
HPI:  Acute visit for:  Urinary frequency: -started about 1-2 months ago -both day and night - 1- time per day -urinary frequency and urgency -drinks 4 large glasses of water and 2 12 ounces diet coke -denies: blood in the urine, excessive thirst, fevers,  rectal pain, penile pain or discharge, weight loss, weak stream, incontinence, weak stream, hesitancy -sees Dr. Risa Grill at [facility] urology HM: -foot exam - sees podiatry, reports he will schedule -HIV - declined -colon cancer screening declined - he agreed to stool cards  Bipolar disorder: -needs TSH, lithium level, bmp for Dr. Willene Hatchet, [phone]  HTN: -BP up at his last endo visit per note -meds: losartan 100  ROS: See pertinent positives and negatives per HPI.  Past Medical History  Diagnosis Date  . Diabetes mellitus   . Hypertension   . Hyperlipidemia   . Bipolar II disorder - Managed by Marye Round [phone]) 05/03/2013  . Unspecified hypothyroidism 05/03/2013    Past Surgical History  Procedure Laterality Date  . Knee surgery      Family History  Problem Relation Age of Onset  . Diabetes Mother   . Hypertension Mother   . Heart disease Father   . Heart attack Father 98    History   Social History  . Marital Status: Single    Spouse Name: N/A  . Number of Children: N/A  . Years of Education: N/A   Social History Main Topics  . Smoking status: Never Smoker   . Smokeless tobacco: Not on file  . Alcohol Use: No  . Drug Use: No  . Sexual Activity: No   Other Topics Concern  . None   Social History Narrative   Work or School: on disability for knee arthritis      Home Situation: lives alone      Spiritual Beliefs:Christian      Lifestyle:no regular exercising; diet not great              Current outpatient prescriptions:  .  fluticasone (FLONASE) 50 MCG/ACT nasal spray, Place 2 sprays into both nostrils daily., Disp: 16 g, Rfl: 0 .  glucose blood (ONETOUCH VERIO) test  strip, Use as instructed to check blood sugar 2 times per day dx code 250.42, Disp: 100 each, Rfl: 12 .  Insulin Aspart Prot & Aspart (NOVOLOG MIX 70/30 FLEXPEN [place]), Inject 50 Units into the skin 2 (two) times daily. , Disp: , Rfl:  .  Insulin Syringe-Needle U-100 (INSULIN SYRINGE 1CC/31GX5/16") 31G X 5/16" 1 ML MISC, Use 3 needles per day, Disp: 100 each, Rfl: 3 .  lamoTRIgine (LAMICTAL) 100 MG tablet, Take by mouth. 2 by mouth once a day, Disp: , Rfl:  .  levothyroxine (SYNTHROID, LEVOTHROID) 50 MCG tablet, TAKE ONE TABLET BY MOUTH ONCE DAILY, Disp: 30 tablet, Rfl: 3 .  lithium carbonate 300 MG capsule, Take 300 mg by mouth 2 (two) times daily. Takes 3 twice a day, Disp: , Rfl:  .  loratadine (CLARITIN) 10 MG tablet, Take 1 tablet (10 mg total) by mouth daily., Disp: 30 tablet, Rfl: 11 .  losartan (COZAAR) 100 MG tablet, Take 1 tablet (100 mg total) by mouth daily., Disp: 90 tablet, Rfl: 0 .  lovastatin (MEVACOR) 20 MG tablet, TAKE 1 TABLET AT BEDTIME, Disp: 90 tablet, Rfl: 0 .  metFORMIN (GLUCOPHAGE) 1000 MG tablet, Take 1 tablet (1,000 mg total) by mouth 2 (two) times daily with a meal., Disp: 180 tablet, Rfl: 0 .  NOVOLIN 70/30 RELION (70-30)  100 UNIT/ML injection, INJECT 50 UNITS SUBCUTANEOUSLY THREE TIMES DAILY, Disp: 30 mL, Rfl: 1 .  omeprazole (PRILOSEC) 20 MG capsule, Take 1 capsule (20 mg total) by mouth daily., Disp: 90 capsule, Rfl: 3 .  VICTOZA 18 MG/3ML SOPN, Inject 0.2 mLs (1.2 mg total) into the skin daily. Inject once daily at the same time, Disp: 2 pen, Rfl: 3  EXAM:  Filed Vitals:   09/04/14 0951  BP: 140/96  Pulse: 94  Temp: 97.8 F (36.6 C)    Body mass index is 40.77 kg/(m^2).  GENERAL: vitals reviewed and listed above, alert, oriented, appears well hydrated and in no acute distress  HEENT: atraumatic, conjunttiva clear, no obvious abnormalities on inspection of external nose and ears  NECK: no obvious masses on inspection  LUNGS: clear to auscultation  bilaterally, no wheezes, rales or rhonchi, good air movement  CV: HRRR, no peripheral edema  GU: declined  MS: moves all extremities without noticeable abnormality  PSYCH: pleasant and cooperative, no obvious depression or anxiety  ASSESSMENT AND PLAN:  Discussed the following assessment and plan:  Urinary frequency - Plan: POC urinalysis w microscopic (non auto), PSA -we discussed possible serious and likely etiologies, workup and treatment, treatment risks and return precautions -after this discussion, [place] opted for basic UA/micro, psa, bmp here - if not revealing and persists he will see his urologist for further eval, tx, likely BPH vs medication side effect -of course, we advised [place]  to return or notify a doctor immediately if symptoms worsen or persist or new concerns arise.  Bipolar affective disorder, most recent episode unspecified type, remission status unspecified - Plan: Basic metabolic panel, TSH, Lithium level for his psychiatrist and notified assistant to forward to psychiatrist  Colon cancer screening He refused. Agreed to stool cards.  -Patient advised to return or notify a doctor immediately if symptoms worsen or persist or new concerns arise.  Patient Instructions  BEFORE YOU LEAVE: -labs -follow up in 3-4 months or as scheduled  If labs ok advise eval with your urologist for you urinary symptoms  Follow up as needed  Complete the stool cards and return  Schedule diabetic foot exam with your podiatrist     Lucretia Kern.

## 2014-09-05 LAB — LITHIUM LEVEL: Lithium Lvl: 0.9 mEq/L (ref 0.80–1.40)

## 2014-09-06 ENCOUNTER — Telehealth: Payer: Self-pay | Admitting: Family Medicine

## 2014-09-06 NOTE — Telephone Encounter (Signed)
Pt has ? Concerning blood work results . Pt viewed results on mychart

## 2014-09-07 LAB — POC URINALYSIS WITH MICROSCOPIC (NON AUTO)MANUAL RESULT
BILIRUBIN UA: NEGATIVE
GLUCOSE UA: NEGATIVE
Ketones, UA: NEGATIVE
LEUKOCYTES UA: NEGATIVE
NITRITE UA: NEGATIVE
PH UA: 7
Protein, UA: NEGATIVE
RBC UA: NEGATIVE
SPEC GRAV UA: 1.015
Urobilinogen, UA: 0.2

## 2014-09-07 NOTE — Telephone Encounter (Signed)
Patient was informed of the results on 6/8 and I called him today and advised him per Dr Maudie Mercury the urine test was normal and he should contact his urologist if he is having problems and he agreed.

## 2014-09-12 DIAGNOSIS — F319 Bipolar disorder, unspecified: Secondary | ICD-10-CM | POA: Diagnosis not present

## 2014-09-14 ENCOUNTER — Encounter: Payer: Self-pay | Admitting: Family Medicine

## 2014-09-14 ENCOUNTER — Ambulatory Visit (INDEPENDENT_AMBULATORY_CARE_PROVIDER_SITE_OTHER): Payer: Commercial Managed Care - HMO | Admitting: Family Medicine

## 2014-09-14 VITALS — BP 142/90 | HR 124 | Temp 98.4°F | Ht 71.5 in

## 2014-09-14 DIAGNOSIS — L720 Epidermal cyst: Secondary | ICD-10-CM

## 2014-09-14 MED ORDER — DOXYCYCLINE HYCLATE 100 MG PO TABS: 100.0000 mg | ORAL_TABLET | Freq: Two times a day (BID) | ORAL | Status: DC

## 2014-09-14 NOTE — Progress Notes (Signed)
Pre visit review using our clinic review tool, if applicable. No additional management support is needed unless otherwise documented below in the visit note. 

## 2014-09-14 NOTE — Progress Notes (Addendum)
HPI:  Acute visit for:  Skin lesion: -started: on L shoulder the last few days, hx of inflamed cyst in the past here -symptoms: redness and swelling L shoulder -denies: fevers, chills, worsening  ROS: See pertinent positives and negatives per HPI.  Past Medical History  Diagnosis Date  . Diabetes mellitus   . Hypertension   . Hyperlipidemia   . Bipolar II disorder - Managed by Marye Round [phone]) 05/03/2013  . Unspecified hypothyroidism 05/03/2013    Past Surgical History  Procedure Laterality Date  . Knee surgery      Family History  Problem Relation Age of Onset  . Diabetes Mother   . Hypertension Mother   . Heart disease Father   . Heart attack Father 64    History   Social History  . Marital Status: Single    Spouse Name: N/A  . Number of Children: N/A  . Years of Education: N/A   Social History Main Topics  . Smoking status: Never Smoker   . Smokeless tobacco: Not on file  . Alcohol Use: No  . Drug Use: No  . Sexual Activity: No   Other Topics Concern  . None   Social History Narrative   Work or School: on disability for knee arthritis      Home Situation: lives alone      Spiritual Beliefs:Christian      Lifestyle:no regular exercising; diet not great              Current outpatient prescriptions:  .  fluticasone (FLONASE) 50 MCG/ACT nasal spray, Place 2 sprays into both nostrils daily., Disp: 16 g, Rfl: 0 .  glucose blood (ONETOUCH VERIO) test strip, Use as instructed to check blood sugar 2 times per day dx code 250.42, Disp: 100 each, Rfl: 12 .  Insulin Aspart Prot & Aspart (NOVOLOG MIX 70/30 FLEXPEN [place]), Inject 50 Units into the skin 2 (two) times daily. , Disp: , Rfl:  .  Insulin Syringe-Needle U-100 (INSULIN SYRINGE 1CC/31GX5/16") 31G X 5/16" 1 ML MISC, Use 3 needles per day, Disp: 100 each, Rfl: 3 .  lamoTRIgine (LAMICTAL) 100 MG tablet, Take by mouth. 2 by mouth once a day, Disp: , Rfl:  .  levothyroxine (SYNTHROID,  LEVOTHROID) 50 MCG tablet, TAKE ONE TABLET BY MOUTH ONCE DAILY, Disp: 30 tablet, Rfl: 3 .  lithium carbonate 300 MG capsule, Take 300 mg by mouth 2 (two) times daily. Takes 3 twice a day, Disp: , Rfl:  .  loratadine (CLARITIN) 10 MG tablet, Take 1 tablet (10 mg total) by mouth daily., Disp: 30 tablet, Rfl: 11 .  losartan (COZAAR) 100 MG tablet, Take 1 tablet (100 mg total) by mouth daily., Disp: 90 tablet, Rfl: 0 .  lovastatin (MEVACOR) 20 MG tablet, TAKE 1 TABLET AT BEDTIME, Disp: 90 tablet, Rfl: 0 .  metFORMIN (GLUCOPHAGE) 1000 MG tablet, Take 1 tablet (1,000 mg total) by mouth 2 (two) times daily with a meal., Disp: 180 tablet, Rfl: 0 .  NOVOLIN 70/30 RELION (70-30) 100 UNIT/ML injection, INJECT 50 UNITS SUBCUTANEOUSLY THREE TIMES DAILY, Disp: 30 mL, Rfl: 1 .  omeprazole (PRILOSEC) 20 MG capsule, Take 1 capsule (20 mg total) by mouth daily., Disp: 90 capsule, Rfl: 3 .  VICTOZA 18 MG/3ML SOPN, Inject 0.2 mLs (1.2 mg total) into the skin daily. Inject once daily at the same time, Disp: 2 pen, Rfl: 3 .  doxycycline (VIBRA-TABS) 100 MG tablet, Take 1 tablet (100 mg total) by mouth 2 (two) times  daily., Disp: 20 tablet, Rfl: 0  EXAM:  Filed Vitals:   09/14/14 1452  BP: 142/90  Pulse: 124  Temp: 98.4 F (36.9 C)    There is no weight on file to calculate BMI.  GENERAL: vitals reviewed and listed above, alert, oriented, appears well hydrated and in no acute distress  HEENT: atraumatic, conjunttiva clear, no obvious abnormalities on inspection of external nose and ears  NECK: no obvious masses on inspection  SKIN: small area of swelling and mild erythema L shoulder  MS: moves all extremities without noticeable abnormality  PSYCH: pleasant and cooperative, no obvious depression or anxiety  ASSESSMENT AND PLAN:  Discussed the following assessment and plan:  Epidermoid cyst - Plan: doxycycline (VIBRA-TABS) 100 MG tablet -likely inflamed epidermoid cyst, discussed potential for  infection - though low -discussed options and he prefers to try to treat non-surgically with compresses and abx with antiinflamatory properties, and follow up with his dermatologist if worsens or persists -advised if sig growth, pain or of course if any fevers, malaise, etc to seek care immediately -Patient advised to return or notify a doctor immediately if symptoms worsen or persist or new concerns arise.  Patient Instructions  Please use warm compresses several times per day  Take the antibiotic as instructed - stay out of the sun while taking this  Seek care immediately if worsening, fevers, other concerns  Follow up with your dermatologist if persists or for removal after resolves if you wish   Epidermal Cyst An epidermal cyst is sometimes called a sebaceous cyst, epidermal inclusion cyst, or infundibular cyst. These cysts usually contain a substance that looks "pasty" or "cheesy" and may have a bad smell. This substance is a protein called keratin. Epidermal cysts are usually found on the face, neck, or trunk. They may also occur in the vaginal area or other parts of the genitalia of both men and women. Epidermal cysts are usually small, painless, slow-growing bumps or lumps that move freely under the skin. It is important not to try to pop them. This may cause an infection and lead to tenderness and swelling. CAUSES  Epidermal cysts may be caused by a deep penetrating injury to the skin or a plugged hair follicle, often associated with acne. SYMPTOMS  Epidermal cysts can become inflamed and cause:  Redness.  Tenderness.  Increased temperature of the skin over the bumps or lumps.  Grayish-white, bad smelling material that drains from the bump or lump. DIAGNOSIS  Epidermal cysts are easily diagnosed by your caregiver during an exam. Rarely, a tissue sample (biopsy) may be taken to rule out other conditions that may resemble epidermal cysts. TREATMENT   Epidermal cysts often get  better and disappear on their own. They are rarely ever cancerous.  If a cyst becomes infected, it may become inflamed and tender. This may require opening and draining the cyst. Treatment with antibiotics may be necessary. When the infection is gone, the cyst may be removed with minor surgery.  Small, inflamed cysts can often be treated with antibiotics or by injecting steroid medicines.  Sometimes, epidermal cysts become large and bothersome. If this happens, surgical removal in your caregiver's office may be necessary. HOME CARE INSTRUCTIONS  Only take over-the-counter or prescription medicines as directed by your caregiver.  Take your antibiotics as directed. Finish them even if you start to feel better. SEEK MEDICAL CARE IF:   Your cyst becomes tender, red, or swollen.  Your condition is not improving or is getting worse.  You have any other questions or concerns. MAKE SURE YOU:  Understand these instructions.  Will watch your condition.  Will get help right away if you are not doing well or get worse. Document Released: 02/16/2004 Document Revised: 06/09/2011 Document Reviewed: 09/23/2010 [facility] Patient Information 2015 [place], [place]. This information is not intended to replace advice given to you by your health care provider. Make sure you discuss any questions you have with your health care provider.      Colin Benton R.

## 2014-09-14 NOTE — Patient Instructions (Signed)
Please use warm compresses several times per day  Take the antibiotic as instructed - stay out of the sun while taking this  Seek care immediately if worsening, fevers, other concerns  Follow up with your dermatologist if persists or for removal after resolves if you wish   Epidermal Cyst An epidermal cyst is sometimes called a sebaceous cyst, epidermal inclusion cyst, or infundibular cyst. These cysts usually contain a substance that looks "pasty" or "cheesy" and may have a bad smell. This substance is a protein called keratin. Epidermal cysts are usually found on the face, neck, or trunk. They may also occur in the vaginal area or other parts of the genitalia of both men and women. Epidermal cysts are usually small, painless, slow-growing bumps or lumps that move freely under the skin. It is important not to try to pop them. This may cause an infection and lead to tenderness and swelling. CAUSES  Epidermal cysts may be caused by a deep penetrating injury to the skin or a plugged hair follicle, often associated with acne. SYMPTOMS  Epidermal cysts can become inflamed and cause:  Redness.  Tenderness.  Increased temperature of the skin over the bumps or lumps.  Grayish-white, bad smelling material that drains from the bump or lump. DIAGNOSIS  Epidermal cysts are easily diagnosed by your caregiver during an exam. Rarely, a tissue sample (biopsy) may be taken to rule out other conditions that may resemble epidermal cysts. TREATMENT   Epidermal cysts often get better and disappear on their own. They are rarely ever cancerous.  If a cyst becomes infected, it may become inflamed and tender. This may require opening and draining the cyst. Treatment with antibiotics may be necessary. When the infection is gone, the cyst may be removed with minor surgery.  Small, inflamed cysts can often be treated with antibiotics or by injecting steroid medicines.  Sometimes, epidermal cysts become large  and bothersome. If this happens, surgical removal in your caregiver's office may be necessary. HOME CARE INSTRUCTIONS  Only take over-the-counter or prescription medicines as directed by your caregiver.  Take your antibiotics as directed. Finish them even if you start to feel better. SEEK MEDICAL CARE IF:   Your cyst becomes tender, red, or swollen.  Your condition is not improving or is getting worse.  You have any other questions or concerns. MAKE SURE YOU:  Understand these instructions.  Will watch your condition.  Will get help right away if you are not doing well or get worse. Document Released: 02/16/2004 Document Revised: 06/09/2011 Document Reviewed: 09/23/2010 [facility] Patient Information 2015 [employer], [place]. This information is not intended to replace advice given to you by your health care provider. Make sure you discuss any questions you have with your health care provider.

## 2014-09-16 ENCOUNTER — Other Ambulatory Visit: Payer: Self-pay | Admitting: Endocrinology

## 2014-09-18 DIAGNOSIS — L0291 Cutaneous abscess, unspecified: Secondary | ICD-10-CM | POA: Diagnosis not present

## 2014-09-18 DIAGNOSIS — L720 Epidermal cyst: Secondary | ICD-10-CM | POA: Diagnosis not present

## 2014-09-18 DIAGNOSIS — L02229 Furuncle of trunk, unspecified: Secondary | ICD-10-CM | POA: Diagnosis not present

## 2014-09-19 ENCOUNTER — Telehealth: Payer: Self-pay | Admitting: Family Medicine

## 2014-09-19 DIAGNOSIS — R399 Unspecified symptoms and signs involving the genitourinary system: Secondary | ICD-10-CM

## 2014-09-19 NOTE — Telephone Encounter (Signed)
Pt call to ask for a referral to [facility]. He said he has discuss his issues with Dr Maudie Mercury

## 2014-09-20 NOTE — Telephone Encounter (Signed)
[employer] insurance does require a referral. I called the pt and informed him the referral was entered and he can call their office to schedule his appointment since he has been seen there and he agreed.

## 2014-09-20 NOTE — Telephone Encounter (Signed)
He told me he sees Dr. Risa Grill at [facility] for his LUTS. Does his insurance require a referral? If so, ok to place. Otherwise he can simple call them for followup if allready a patient there. Thanks.

## 2014-09-21 DIAGNOSIS — N138 Other obstructive and reflux uropathy: Secondary | ICD-10-CM | POA: Diagnosis not present

## 2014-09-21 DIAGNOSIS — R3915 Urgency of urination: Secondary | ICD-10-CM | POA: Diagnosis not present

## 2014-09-21 DIAGNOSIS — R339 Retention of urine, unspecified: Secondary | ICD-10-CM | POA: Diagnosis not present

## 2014-09-21 DIAGNOSIS — N401 Enlarged prostate with lower urinary tract symptoms: Secondary | ICD-10-CM | POA: Diagnosis not present

## 2014-09-29 DIAGNOSIS — F319 Bipolar disorder, unspecified: Secondary | ICD-10-CM | POA: Diagnosis not present

## 2014-10-03 ENCOUNTER — Other Ambulatory Visit: Payer: Self-pay | Admitting: Endocrinology

## 2014-10-16 ENCOUNTER — Other Ambulatory Visit: Payer: Self-pay | Admitting: Endocrinology

## 2014-10-17 MED ORDER — LEVOTHYROXINE SODIUM 50 MCG PO TABS: 50.0000 ug | ORAL_TABLET | Freq: Every day | ORAL | Status: DC

## 2014-10-20 DIAGNOSIS — F319 Bipolar disorder, unspecified: Secondary | ICD-10-CM | POA: Diagnosis not present

## 2014-10-22 ENCOUNTER — Other Ambulatory Visit: Payer: Self-pay | Admitting: Endocrinology

## 2014-10-23 ENCOUNTER — Other Ambulatory Visit: Payer: Self-pay | Admitting: Family Medicine

## 2014-10-24 DIAGNOSIS — R3915 Urgency of urination: Secondary | ICD-10-CM | POA: Diagnosis not present

## 2014-10-24 DIAGNOSIS — R339 Retention of urine, unspecified: Secondary | ICD-10-CM | POA: Diagnosis not present

## 2014-11-10 ENCOUNTER — Other Ambulatory Visit: Payer: Self-pay | Admitting: Family Medicine

## 2014-11-13 ENCOUNTER — Other Ambulatory Visit: Payer: Commercial Managed Care - HMO

## 2014-11-13 ENCOUNTER — Other Ambulatory Visit (INDEPENDENT_AMBULATORY_CARE_PROVIDER_SITE_OTHER): Payer: Commercial Managed Care - HMO

## 2014-11-13 DIAGNOSIS — E1165 Type 2 diabetes mellitus with hyperglycemia: Secondary | ICD-10-CM

## 2014-11-13 DIAGNOSIS — E785 Hyperlipidemia, unspecified: Secondary | ICD-10-CM | POA: Diagnosis not present

## 2014-11-13 DIAGNOSIS — E038 Other specified hypothyroidism: Secondary | ICD-10-CM

## 2014-11-13 DIAGNOSIS — F319 Bipolar disorder, unspecified: Secondary | ICD-10-CM | POA: Diagnosis not present

## 2014-11-13 DIAGNOSIS — IMO0002 Reserved for concepts with insufficient information to code with codable children: Secondary | ICD-10-CM

## 2014-11-13 LAB — COMPREHENSIVE METABOLIC PANEL
ALBUMIN: 4 g/dL (ref 3.5–5.2)
ALT: 28 U/L (ref 0–53)
AST: 26 U/L (ref 0–37)
Alkaline Phosphatase: 36 U/L — ABNORMAL LOW (ref 39–117)
BILIRUBIN TOTAL: 0.6 mg/dL (ref 0.2–1.2)
BUN: 7 mg/dL (ref 6–23)
CALCIUM: 9.4 mg/dL (ref 8.4–10.5)
CHLORIDE: 108 meq/L (ref 96–112)
CO2: 29 mEq/L (ref 19–32)
CREATININE: 0.97 mg/dL (ref 0.40–1.50)
GFR: 84.29 mL/min (ref >60.00)
Glucose, Bld: 94 mg/dL (ref 70–99)
Potassium: 3.9 mEq/L (ref 3.5–5.1)
Sodium: 142 mEq/L (ref 135–145)
TOTAL PROTEIN: 6.7 g/dL (ref 6.0–8.3)

## 2014-11-13 LAB — LIPID PANEL
CHOLESTEROL: 90 mg/dL (ref 0–200)
HDL: 25.7 mg/dL — ABNORMAL LOW (ref >39.00)
LDL Cholesterol: 36 mg/dL (ref 0–99)
NonHDL: 63.88
Total CHOL/HDL Ratio: 3
Triglycerides: 140 mg/dL (ref 0.0–149.0)
VLDL: 28 mg/dL (ref 0.0–40.0)

## 2014-11-13 LAB — TSH: TSH: 2.16 u[IU]/mL (ref 0.35–4.50)

## 2014-11-13 LAB — T4, FREE: FREE T4: 1.29 ng/dL (ref 0.60–1.60)

## 2014-11-13 LAB — HEMOGLOBIN A1C: Hgb A1c MFr Bld: 5.3 % (ref 4.6–6.5)

## 2014-11-16 ENCOUNTER — Other Ambulatory Visit: Payer: Self-pay | Admitting: *Deleted

## 2014-11-16 ENCOUNTER — Ambulatory Visit (INDEPENDENT_AMBULATORY_CARE_PROVIDER_SITE_OTHER): Payer: Commercial Managed Care - HMO | Admitting: Endocrinology

## 2014-11-16 ENCOUNTER — Encounter: Payer: Self-pay | Admitting: Endocrinology

## 2014-11-16 VITALS — BP 152/88 | HR 96 | Temp 97.8°F | Resp 16 | Ht 71.5 in | Wt 301.2 lb

## 2014-11-16 DIAGNOSIS — E1159 Type 2 diabetes mellitus with other circulatory complications: Secondary | ICD-10-CM

## 2014-11-16 DIAGNOSIS — IMO0002 Reserved for concepts with insufficient information to code with codable children: Secondary | ICD-10-CM

## 2014-11-16 DIAGNOSIS — E1165 Type 2 diabetes mellitus with hyperglycemia: Secondary | ICD-10-CM

## 2014-11-16 DIAGNOSIS — I152 Hypertension secondary to endocrine disorders: Secondary | ICD-10-CM

## 2014-11-16 DIAGNOSIS — E785 Hyperlipidemia, unspecified: Secondary | ICD-10-CM

## 2014-11-16 DIAGNOSIS — E1169 Type 2 diabetes mellitus with other specified complication: Secondary | ICD-10-CM

## 2014-11-16 DIAGNOSIS — I10 Essential (primary) hypertension: Secondary | ICD-10-CM

## 2014-11-16 DIAGNOSIS — E038 Other specified hypothyroidism: Secondary | ICD-10-CM

## 2014-11-16 MED ORDER — HYDROCHLOROTHIAZIDE 12.5 MG PO CAPS: 12.5000 mg | ORAL_CAPSULE | Freq: Every day | ORAL | Status: DC

## 2014-11-16 NOTE — Progress Notes (Signed)
Patient ID: Matthew Barnes, male   DOB: May 24, 1955, 59 y.o.   MRN: [id number]    Reason for Appointment: Followup for Type 2 Diabetes  Referring physician:  Maudie Mercury  History of Present Illness:          Diagnosis: Type 2 diabetes mellitus, date of diagnosis:   2011       Past history:  He was started on metformin at diagnosis and apparently did have fairly good control initially However about a year later because of poor control he was given insulin in addition. He had been taking Lantus insulin until about 2/15, up to 80 units a day However he does not think his blood sugars  Were controlled with this Because of higher A1c of 13.1% he was referred here for diabetes management in 6/15; was started on glucose monitoring and insulin was changed from low dose Levemir to Humalog mix 70 units a day He was also started on Victoza  to help with blood sugar control and facilitate weight loss on his initial consultation  Recent history:  INSULIN regimen is described as: Novolin mix 70/30, 50 units ac bid-tid using syringe   A1c is now excellent at 5.3 and has been well controlled with the previous level being 5.5 A1c appears to be lower than expected for his blood sugars again This is most likely from adding Victoza again  which he is now able to afford and is tolerating 1.2 mg daily Also he says that with control of his depression he is able to control his excessive eating and carbohydrate intake much better too   Current blood sugar patterns, management of diabetes and problems:   He is checking mostly fasting readings; these are relatively lower with average about 100  He now says that he tends to have hypoglycemia during the night at about 3 AM periodically, occurring even as often as every other night but he does not check his sugar at that time; lowest documented reading is 62  POSTPRANDIAL readings are not being checked and he has only one reading in the evening which was over 200 from not  watching his diet.  Blood sugars in the afternoons are also relatively good  Insulin regimen: He now says that he is taking his premixed insulin right after eating even though he was instructed on taking this 30 minute before eating on the last visit  He will frequently not taking insulin at lunchtime partly because he is busy driving and sometimes will not eat lunch also.  He has difficulty losing weight  He still not motivated to exercise  He still has not scheduled an appointment with dietitian as recommended  Oral hypoglycemic drugs the patient is taking are: Metformin 1 g twice a day      Side effects from medications have been: None  Glucose monitoring:  done 2.4 times a day         Glucometer:  One Touch Verio    Blood Glucose readings from review of monitor as below, checking mostly in the morning  Mean values apply above for all meters except median for One Touch  PRE-MEAL Fasting Lunch Dinner Bedtime Overall  Glucose range:  62-115   64-179   139   257    Mean/median:  98      98    Glycemic control:   Lab Results  Component Value Date   HGBA1C 5.3 11/13/2014   HGBA1C 5.5 07/24/2014   HGBA1C 7.3* 04/19/2014   Lab  Results  Component Value Date   MICROALBUR <0.7 08/16/2014   LDLCALC 36 11/13/2014   CREATININE 0.97 11/13/2014    Self-care: The diet that the patient has been following is: None, unable to control portions when depressed  Meals: 3 meals per day (dinner 6 pm-10 pm)         Exercise: not  doing        Dietician visit: Most recent: never               Compliance with the medical regimen: Fair  Retinal exam: Most recent: 5/15.    Weight history: baseline 295  Wt Readings from Last 3 Encounters:  11/16/14 301 lb 3.2 oz (136.623 kg)  09/04/14 296 lb 6.4 oz (134.446 kg)  08/16/14 300 lb 3.2 oz (136.17 kg)      Medication List       This list is accurate as of: 11/16/14  8:35 PM.  Always use your most recent med list.                citalopram 20 MG tablet  Commonly known as:  CELEXA     EQ LORATADINE 10 MG tablet  Generic drug:  loratadine  TAKE ONE TABLET BY MOUTH ONCE DAILY.     glucose blood test strip  Commonly known as:  ONETOUCH VERIO  Use as instructed to check blood sugar 2 times per day dx code 250.42     hydrochlorothiazide 12.5 MG capsule  Commonly known as:  MICROZIDE  Take 1 capsule (12.5 mg total) by mouth daily.     INSULIN SYRINGE 1CC/31GX5/16" 31G X 5/16" 1 ML Misc  Use 3 needles per day     lamoTRIgine 100 MG tablet  Commonly known as:  LAMICTAL  Take by mouth. 2 by mouth once a day     levothyroxine 50 MCG tablet  Commonly known as:  SYNTHROID, LEVOTHROID  Take 1 tablet (50 mcg total) by mouth daily.     lithium carbonate 300 MG capsule  Take 300 mg by mouth 2 (two) times daily. Takes 3 twice a day     losartan 100 MG tablet  Commonly known as:  COZAAR  Take 1 tablet (100 mg total) by mouth daily.     lovastatin 20 MG tablet  Commonly known as:  MEVACOR  TAKE 1 TABLET AT BEDTIME     metFORMIN 1000 MG tablet  Commonly known as:  GLUCOPHAGE  Take 1 tablet (1,000 mg total) by mouth 2 (two) times daily with a meal.     NOVOLIN 70/30 RELION (70-30) 100 UNIT/ML injection  Generic drug:  insulin NPH-regular Human  INJECT 50 UNITS SUBCUTANEOUSLY THREE TIMES DAILY     omeprazole 20 MG capsule  Commonly known as:  PRILOSEC  Take 1 capsule (20 mg total) by mouth daily.     tamsulosin 0.4 MG Caps capsule  Commonly known as:  FLOMAX     VICTOZA 18 MG/3ML Sopn  Generic drug:  Liraglutide  Inject 0.2 mLs (1.2 mg total) into the skin daily. Inject once daily at the same time        Allergies: No Known Allergies  Past Medical History  Diagnosis Date  . Diabetes mellitus   . Hypertension   . Hyperlipidemia   . Bipolar II disorder - Managed by Marye Round [phone]) 05/03/2013  . Unspecified hypothyroidism 05/03/2013    Past Surgical History  Procedure Laterality  Date  . Knee surgery  Family History  Problem Relation Age of Onset  . Diabetes Mother   . Hypertension Mother   . Heart disease Father   . Heart attack Father 47    Social History:  reports that he has never smoked. He does not have any smokeless tobacco history on file. He reports that he does not drink alcohol or use illicit drugs.    Review of Systems   HYPERTENSION: He usually has white coat syndrome and blood pressure usually high when he first comes in even when he goes to his psychiatrist Currently taking losartan 100 mg Followed by PCP periodically Blood pressure was fairly good with PCP in March      Lipids: He has had marked increase in triglycerides previously. Currently only on 20 mg lovastatin       Lab Results  Component Value Date   CHOL 90 11/13/2014   HDL 25.70* 11/13/2014   LDLCALC 36 11/13/2014   LDLDIRECT 43.0 07/24/2014   TRIG 140.0 11/13/2014   CHOLHDL 3 11/13/2014       Thyroid:  Has had mild hypothyroidism for a few years on his 50 mcg supplement. His lithium dose has been continued.    Lab Results  Component Value Date   TSH 2.16 11/13/2014        Diabetic neuropathy present.   His foot exam  in 6/15 showed the following sensory findings: Patchy decrease in sensation in the toes especially right and decreased on the plantar surface also   LABS:  Appointment on 11/13/2014  Component Date Value Ref Range Status  . Hgb A1c MFr Bld 11/13/2014 5.3  4.6 - 6.5 % Final   Glycemic Control Guidelines for People with Diabetes:Non Diabetic:  <6%Goal of Therapy: <7%Additional Action Suggested:  >8%   . Sodium 11/13/2014 142  135 - 145 mEq/L Final  . Potassium 11/13/2014 3.9  3.5 - 5.1 mEq/L Final  . Chloride 11/13/2014 108  96 - 112 mEq/L Final  . CO2 11/13/2014 29  19 - 32 mEq/L Final  . Glucose, Bld 11/13/2014 94  70 - 99 mg/dL Final  . BUN 11/13/2014 7  6 - 23 mg/dL Final  . Creatinine, Ser 11/13/2014 0.97  0.40 - 1.50 mg/dL Final  .  Total Bilirubin 11/13/2014 0.6  0.2 - 1.2 mg/dL Final  . Alkaline Phosphatase 11/13/2014 36* 39 - 117 U/L Final  . AST 11/13/2014 26  0 - 37 U/L Final  . ALT 11/13/2014 28  0 - 53 U/L Final  . Total Protein 11/13/2014 6.7  6.0 - 8.3 g/dL Final  . Albumin 11/13/2014 4.0  3.5 - 5.2 g/dL Final  . Calcium 11/13/2014 9.4  8.4 - 10.5 mg/dL Final  . GFR 11/13/2014 84.29  >60.00 mL/min Final  . Cholesterol 11/13/2014 90  0 - 200 mg/dL Final   ATP III Classification       Desirable:  < 200 mg/dL               Borderline High:  200 - 239 mg/dL          High:  > = 240 mg/dL  . Triglycerides 11/13/2014 140.0  0.0 - 149.0 mg/dL Final   Normal:  <150 mg/dLBorderline High:  150 - 199 mg/dL  . HDL 11/13/2014 25.70* >39.00 mg/dL Final  . VLDL 11/13/2014 28.0  0.0 - 40.0 mg/dL Final  . LDL Cholesterol 11/13/2014 36  0 - 99 mg/dL Final  . Total CHOL/HDL Ratio 11/13/2014 3   Final  Men          Women1/2 Average Risk     3.4          3.3Average Risk          5.0          4.42X Average Risk          9.6          7.13X Average Risk          15.0          11.0                      . NonHDL 11/13/2014 63.88   Final   NOTE:  Non-HDL goal should be 30 mg/dL higher than patient's LDL goal (i.e. LDL goal of < 70 mg/dL, would have non-HDL goal of < 100 mg/dL)  . TSH 11/13/2014 2.16  0.35 - 4.50 uIU/mL Final  . Free T4 11/13/2014 1.29  0.60 - 1.60 ng/dL Final    Physical Examination:  BP 152/88 mmHg  Pulse 96  Temp(Src) 97.8 F (36.6 C)  Resp 16  Ht 5' 11.5" (1.816 m)  Wt 301 lb 3.2 oz (136.623 kg)  BMI 41.43 kg/m2  SpO2 97%  Repeat blood pressure 158/86 with large cuff No edema    ASSESSMENT/PLAN:   Diabetes type 2, uncontrolled     He has excellent and consistent control of his diabetes with continuing Victoza and metformin to his insulin regimen He is now able to also control his carbohydrate intake and portions with improved control of his depression However has not been motivated or  finding the time to exercise as directed His weight has leveled off but he needs to lose some weight  Glucose monitoring has been somewhat sporadic and mostly in the morning Since his A1c is in the normal range he likely does not have consistent postprandial hyperglycemia, has only one documented high reading after dinner Also starting to get low sugars overnight at regular frequency now  Hypertension: He appears to have persistently high blood pressure readings and is on losartan alone He has not seen his PCP recently for this  HYPOTHYROIDISM: TSH is consistently normal  HYPERLIPIDEMIA: He has mild high cholesterol levels and these are very well controlled, does have a low HDL which should improve with weight loss    Recommendations:   For now he will reduce his evening dose by at least 4 units to avoid overnight hypoglycemia  He will try to check blood sugars after his evening meal which he is not doing and also when he is eating lunch  He will try to take insulin 30 minutes before eating as much as possible  Bring blood sugar monitor on each visit with more postprandial readings  He will try to walk daily even while he is at work and can walk in the parking lot when he is driving from place to place  His lunchtime dose can be adjusted based on his blood sugars in the afternoon, he has not done many of these  Start taking HCTZ to losartan and follow-up with PCP and needs follow-up electrolytes also  Patient Instructions  Check blood sugars on waking up ..3  .. times a week Also check blood sugars about 2 hours after a meal and do this after different meals by rotation  Recommended blood sugar levels on waking up is 90-130 and about 2 hours after meal is 140-180 Please bring blood sugar  monitor to each visit.  Try taking insulin 30 min before meals  Try reduce evening dose to 46 units   Add HCTZ to BP meds     Counseling time over 50% of today's 25 minute visit on  above subjects   Matthew Barnes 11/16/2014, 8:35 PM   Note: This office note was prepared with Estate agent. Any transcriptional errors that result from this process are unintentional.

## 2014-11-16 NOTE — Patient Instructions (Addendum)
Check blood sugars on waking up ..3  .. times a week Also check blood sugars about 2 hours after a meal and do this after different meals by rotation  Recommended blood sugar levels on waking up is 90-130 and about 2 hours after meal is 140-180 Please bring blood sugar monitor to each visit.  Try taking insulin 30 min before meals  Try reduce evening dose to 46 units   Add HCTZ to BP meds

## 2014-11-27 DIAGNOSIS — F319 Bipolar disorder, unspecified: Secondary | ICD-10-CM | POA: Diagnosis not present

## 2014-11-30 ENCOUNTER — Other Ambulatory Visit: Payer: Self-pay | Admitting: Family Medicine

## 2014-12-11 DIAGNOSIS — F319 Bipolar disorder, unspecified: Secondary | ICD-10-CM | POA: Diagnosis not present

## 2014-12-14 ENCOUNTER — Ambulatory Visit (INDEPENDENT_AMBULATORY_CARE_PROVIDER_SITE_OTHER): Payer: Commercial Managed Care - HMO | Admitting: Family Medicine

## 2014-12-14 ENCOUNTER — Encounter: Payer: Self-pay | Admitting: Family Medicine

## 2014-12-14 VITALS — BP 132/80 | HR 102 | Temp 97.8°F | Ht 71.5 in | Wt 300.6 lb

## 2014-12-14 DIAGNOSIS — N529 Male erectile dysfunction, unspecified: Secondary | ICD-10-CM | POA: Diagnosis not present

## 2014-12-14 NOTE — Progress Notes (Signed)
HPI:   Acute visit for ED: -chronic difficulty maintaining erection -wants viagra, understands risks, sees urology but no appt for 6 months  -denies: new urinary symptoms, penile discharge or pain, decreased desire  ROS: See pertinent positives and negatives per HPI.  Past Medical History  Diagnosis Date  . Diabetes mellitus   . Hypertension   . Hyperlipidemia   . Bipolar II disorder - Managed by Marye Round [phone]) 05/03/2013  . Unspecified hypothyroidism 05/03/2013    Past Surgical History  Procedure Laterality Date  . Knee surgery      Family History  Problem Relation Age of Onset  . Diabetes Mother   . Hypertension Mother   . Heart disease Father   . Heart attack Father 58    Social History   Social History  . Marital Status: Single    Spouse Name: N/A  . Number of Children: N/A  . Years of Education: N/A   Social History Main Topics  . Smoking status: Never Smoker   . Smokeless tobacco: None  . Alcohol Use: No  . Drug Use: No  . Sexual Activity: No   Other Topics Concern  . None   Social History Narrative   Work or School: on disability for knee arthritis      Home Situation: lives alone      Spiritual Beliefs:Christian      Lifestyle:no regular exercising; diet not great              Current outpatient prescriptions:  .  buPROPion (WELLBUTRIN SR) 100 MG 12 hr tablet, Take 100 mg by mouth 2 (two) times daily. , Disp: , Rfl:  .  citalopram (CELEXA) 20 MG tablet, , Disp: , Rfl:  .  EQ LORATADINE 10 MG tablet, TAKE ONE TABLET BY MOUTH ONCE DAILY., Disp: 30 tablet, Rfl: 0 .  glucose blood (ONETOUCH VERIO) test strip, Use as instructed to check blood sugar 2 times per day dx code 250.42, Disp: 100 each, Rfl: 12 .  hydrochlorothiazide (MICROZIDE) 12.5 MG capsule, Take 1 capsule (12.5 mg total) by mouth daily., Disp: 30 capsule, Rfl: 3 .  Insulin Syringe-Needle U-100 (INSULIN SYRINGE 1CC/31GX5/16") 31G X 5/16" 1 ML MISC, Use 3 needles  per day, Disp: 100 each, Rfl: 3 .  lamoTRIgine (LAMICTAL) 100 MG tablet, Take by mouth. 2 by mouth once a day, Disp: , Rfl:  .  levothyroxine (SYNTHROID, LEVOTHROID) 50 MCG tablet, Take 1 tablet (50 mcg total) by mouth daily., Disp: 30 tablet, Rfl: 3 .  lithium carbonate 300 MG capsule, Take 300 mg by mouth 2 (two) times daily. Takes 3 twice a day, Disp: , Rfl:  .  losartan (COZAAR) 100 MG tablet, TAKE 1 TABLET EVERY DAY, Disp: 90 tablet, Rfl: 0 .  lovastatin (MEVACOR) 20 MG tablet, TAKE 1 TABLET AT BEDTIME, Disp: 90 tablet, Rfl: 0 .  metFORMIN (GLUCOPHAGE) 1000 MG tablet, Take 1 tablet (1,000 mg total) by mouth 2 (two) times daily with a meal., Disp: 180 tablet, Rfl: 0 .  NOVOLIN 70/30 RELION (70-30) 100 UNIT/ML injection, INJECT 50 UNITS SUBCUTANEOUSLY THREE TIMES DAILY, Disp: 30 mL, Rfl: 2 .  omeprazole (PRILOSEC) 20 MG capsule, Take 1 capsule (20 mg total) by mouth daily., Disp: 90 capsule, Rfl: 3 .  tamsulosin (FLOMAX) 0.4 MG CAPS capsule, , Disp: , Rfl:  .  VICTOZA 18 MG/3ML SOPN, Inject 0.2 mLs (1.2 mg total) into the skin daily. Inject once daily at the same time, Disp: 2 pen, Rfl: 3 .  sildenafil (REVATIO) 20 MG tablet, Take 1 tablet (20 mg total) by mouth daily as needed (sexual activity)., Disp: 10 tablet, Rfl: 0  EXAM:  Filed Vitals:   12/14/14 1317  BP: 132/80  Pulse: 102  Temp: 97.8 F (36.6 C)    Body mass index is 41.35 kg/(m^2).  GENERAL: vitals reviewed and listed above, alert, oriented, appears well hydrated and in no acute distress  HEENT: atraumatic, conjunttiva clear, no obvious abnormalities on inspection of external nose and ears  NECK: no obvious masses on inspection  LUNGS: clear to auscultation bilaterally, no wheezes, rales or rhonchi, good air movement  CV: HRRR, no peripheral edema  MS: moves all extremities without noticeable abnormality  PSYCH: pleasant and cooperative, no obvious depression or anxiety  ASSESSMENT AND PLAN:  Discussed the  following assessment and plan:  Erectile dysfunction, unspecified erectile dysfunction type  -discussed significant, serious and common side effects -he feels all this is worth it and is adamant about taking viargra -rx for marley drug per his preference and advised using lowest dose possibe -Patient advised to return or notify a doctor immediately if symptoms worsen or persist or new concerns arise.  There are no Patient Instructions on file for this visit.   Colin Benton R.

## 2014-12-14 NOTE — Progress Notes (Signed)
Pre visit review using our clinic review tool, if applicable. No additional management support is needed unless otherwise documented below in the visit note. 

## 2014-12-18 ENCOUNTER — Other Ambulatory Visit: Payer: Self-pay | Admitting: Family Medicine

## 2015-01-02 DIAGNOSIS — R3915 Urgency of urination: Secondary | ICD-10-CM | POA: Diagnosis not present

## 2015-01-02 DIAGNOSIS — R339 Retention of urine, unspecified: Secondary | ICD-10-CM | POA: Diagnosis not present

## 2015-01-02 DIAGNOSIS — R350 Frequency of micturition: Secondary | ICD-10-CM | POA: Diagnosis not present

## 2015-01-05 DIAGNOSIS — F319 Bipolar disorder, unspecified: Secondary | ICD-10-CM | POA: Diagnosis not present

## 2015-01-16 DIAGNOSIS — M2041 Other hammer toe(s) (acquired), right foot: Secondary | ICD-10-CM | POA: Diagnosis not present

## 2015-01-16 DIAGNOSIS — G5760 Lesion of plantar nerve, unspecified lower limb: Secondary | ICD-10-CM | POA: Diagnosis not present

## 2015-01-16 DIAGNOSIS — G5761 Lesion of plantar nerve, right lower limb: Secondary | ICD-10-CM | POA: Diagnosis not present

## 2015-01-16 DIAGNOSIS — E119 Type 2 diabetes mellitus without complications: Secondary | ICD-10-CM | POA: Diagnosis not present

## 2015-01-16 DIAGNOSIS — M2042 Other hammer toe(s) (acquired), left foot: Secondary | ICD-10-CM | POA: Diagnosis not present

## 2015-01-16 DIAGNOSIS — G5762 Lesion of plantar nerve, left lower limb: Secondary | ICD-10-CM | POA: Diagnosis not present

## 2015-01-23 DIAGNOSIS — G5761 Lesion of plantar nerve, right lower limb: Secondary | ICD-10-CM | POA: Diagnosis not present

## 2015-02-02 DIAGNOSIS — F319 Bipolar disorder, unspecified: Secondary | ICD-10-CM | POA: Diagnosis not present

## 2015-02-09 ENCOUNTER — Other Ambulatory Visit: Payer: Self-pay | Admitting: Endocrinology

## 2015-02-13 ENCOUNTER — Other Ambulatory Visit: Payer: Self-pay | Admitting: *Deleted

## 2015-02-13 ENCOUNTER — Other Ambulatory Visit (INDEPENDENT_AMBULATORY_CARE_PROVIDER_SITE_OTHER): Payer: Commercial Managed Care - HMO

## 2015-02-13 DIAGNOSIS — E038 Other specified hypothyroidism: Secondary | ICD-10-CM

## 2015-02-13 DIAGNOSIS — E1165 Type 2 diabetes mellitus with hyperglycemia: Secondary | ICD-10-CM | POA: Diagnosis not present

## 2015-02-13 DIAGNOSIS — IMO0002 Reserved for concepts with insufficient information to code with codable children: Secondary | ICD-10-CM

## 2015-02-13 DIAGNOSIS — Z79899 Other long term (current) drug therapy: Secondary | ICD-10-CM | POA: Diagnosis not present

## 2015-02-13 LAB — BASIC METABOLIC PANEL
BUN: 17 mg/dL (ref 6–23)
CALCIUM: 9.5 mg/dL (ref 8.4–10.5)
CO2: 30 mEq/L (ref 19–32)
Chloride: 105 mEq/L (ref 96–112)
Creatinine, Ser: 1.22 mg/dL (ref 0.40–1.50)
GFR: 64.64 mL/min (ref >60.00)
Glucose, Bld: 96 mg/dL (ref 70–99)
POTASSIUM: 4.3 meq/L (ref 3.5–5.1)
SODIUM: 141 meq/L (ref 135–145)

## 2015-02-13 LAB — TSH: TSH: 2.71 u[IU]/mL (ref 0.35–4.50)

## 2015-02-13 LAB — T4, FREE: FREE T4: 1.44 ng/dL (ref 0.60–1.60)

## 2015-02-13 LAB — HEMOGLOBIN A1C: HEMOGLOBIN A1C: 5.9 % (ref 4.6–6.5)

## 2015-02-15 ENCOUNTER — Encounter: Payer: Self-pay | Admitting: Endocrinology

## 2015-02-15 ENCOUNTER — Ambulatory Visit (INDEPENDENT_AMBULATORY_CARE_PROVIDER_SITE_OTHER): Payer: Commercial Managed Care - HMO | Admitting: Endocrinology

## 2015-02-15 VITALS — BP 136/78 | HR 95 | Temp 97.9°F | Resp 16 | Ht 71.0 in | Wt 305.4 lb

## 2015-02-15 DIAGNOSIS — E1159 Type 2 diabetes mellitus with other circulatory complications: Secondary | ICD-10-CM

## 2015-02-15 DIAGNOSIS — E1165 Type 2 diabetes mellitus with hyperglycemia: Secondary | ICD-10-CM | POA: Diagnosis not present

## 2015-02-15 DIAGNOSIS — I10 Essential (primary) hypertension: Secondary | ICD-10-CM | POA: Diagnosis not present

## 2015-02-15 DIAGNOSIS — Z794 Long term (current) use of insulin: Secondary | ICD-10-CM | POA: Diagnosis not present

## 2015-02-15 DIAGNOSIS — I152 Hypertension secondary to endocrine disorders: Secondary | ICD-10-CM

## 2015-02-15 NOTE — Progress Notes (Signed)
Patient ID: Matthew Barnes, male   DOB: Aug 16, 1955, 59 y.o.   MRN: [id number]    Reason for Appointment: Followup for Type 2 Diabetes  Referring physician:  Maudie Mercury  History of Present Illness:          Diagnosis: Type 2 diabetes mellitus, date of diagnosis:   2011       Past history:  He was started on metformin at diagnosis and apparently did have fairly good control initially However about a year later because of poor control he was given insulin in addition. He had been taking Lantus insulin until about 2/15, up to 80 units a day However he does not think his blood sugars  Were controlled with this Because of higher A1c of 13.1% he was referred here for diabetes management in 6/15; was started on glucose monitoring and insulin was changed from low dose Levemir to Humalog mix 70 units a day He was also started on Victoza  to help with blood sugar control and facilitate weight loss on his initial consultation  Recent history:  INSULIN regimen is described as: Novolin mix 70/30, 50 units ac bid-tid using syringe   A1c is still very well controlled, 5.9 compared to 5.3 previously A1c appears to be lower than expected for his blood sugars again  He has gone off Victoza again  which he is not able to afford for a month because of his donut hole   Current blood sugar patterns, management of diabetes and problems:   He is again checking mostly fasting readings; these are relatively well controlled although has sporadic high readings  He has only a few readings later at night which appear to be fairly good  Again has difficulty remembering to take his insulin at lunchtime and this is partly because of his being driving during the day  POSTPRANDIAL readings are not being checked and difficult to know how well these are controlled  Blood sugars in the afternoons are also relatively good  Insulin regimen: He is trying to take his insulin before meals as much as possible and not  postprandially as he was doing last visit  Occasionally will have more snacks when he is stressed  He still not motivated to exercise and has not lost any weight  Oral hypoglycemic drugs the patient is taking are: Metformin 1 g twice a day      Side effects from medications have been: None  Glucose monitoring:  done less than 1 times a day         Glucometer:  One Touch Verio    Blood Glucose readings from review of monitor as below, checking mostly in the morning  Mean values apply above for all meters except median for One Touch  PRE-MEAL Fasting Lunch Dinner Bedtime Overall  Glucose range:  86-217     140-167    Mean/median:  121      130     Glycemic control:   Lab Results  Component Value Date   HGBA1C 5.9 02/13/2015   HGBA1C 5.3 11/13/2014   HGBA1C 5.5 07/24/2014   Lab Results  Component Value Date   MICROALBUR <0.7 08/16/2014   LDLCALC 36 11/13/2014   CREATININE 1.22 02/13/2015    Self-care: The diet that the patient has been following is: None, unable to control portions when depressed  Meals: 3 meals per day (dinner 6 pm-10 pm)         Exercise: not  doing walking, recently going to the gym  and doing some weights        Dietician visit: Most recent: never                 Weight history: baseline 295  Wt Readings from Last 3 Encounters:  02/15/15 305 lb 6.4 oz (138.529 kg)  12/14/14 300 lb 9.6 oz (136.351 kg)  11/16/14 301 lb 3.2 oz (136.623 kg)   Appointment on 02/13/2015  Component Date Value Ref Range Status  . Hgb A1c MFr Bld 02/13/2015 5.9  4.6 - 6.5 % Final   Glycemic Control Guidelines for People with Diabetes:Non Diabetic:  <6%Goal of Therapy: <7%Additional Action Suggested:  >8%   . Sodium 02/13/2015 141  135 - 145 mEq/L Final  . Potassium 02/13/2015 4.3  3.5 - 5.1 mEq/L Final  . Chloride 02/13/2015 105  96 - 112 mEq/L Final  . CO2 02/13/2015 30  19 - 32 mEq/L Final  . Glucose, Bld 02/13/2015 96  70 - 99 mg/dL Final  . BUN 02/13/2015 17  6 -  23 mg/dL Final  . Creatinine, Ser 02/13/2015 1.22  0.40 - 1.50 mg/dL Final  . Calcium 02/13/2015 9.5  8.4 - 10.5 mg/dL Final  . GFR 02/13/2015 64.64  >60.00 mL/min Final  . TSH 02/13/2015 2.71  0.35 - 4.50 uIU/mL Final  . Free T4 02/13/2015 1.44  0.60 - 1.60 ng/dL Final        Medication List       This list is accurate as of: 02/15/15 11:59 PM.  Always use your most recent med list.               buPROPion 100 MG 12 hr tablet  Commonly known as:  WELLBUTRIN SR  Take 100 mg by mouth 2 (two) times daily.     citalopram 20 MG tablet  Commonly known as:  CELEXA     EQ LORATADINE 10 MG tablet  Generic drug:  loratadine  TAKE ONE TABLET BY MOUTH ONCE DAILY.     EQ LORATADINE 10 MG tablet  Generic drug:  loratadine  TAKE ONE TABLET BY MOUTH ONCE DAILY.     glucose blood test strip  Commonly known as:  ONETOUCH VERIO  Use as instructed to check blood sugar 2 times per day dx code 250.42     hydrochlorothiazide 12.5 MG capsule  Commonly known as:  MICROZIDE  Take 1 capsule (12.5 mg total) by mouth daily.     INSULIN SYRINGE 1CC/31GX5/16" 31G X 5/16" 1 ML Misc  Use 3 needles per day     lamoTRIgine 100 MG tablet  Commonly known as:  LAMICTAL  Take by mouth. 2 by mouth once a day     levothyroxine 50 MCG tablet  Commonly known as:  SYNTHROID, LEVOTHROID  Take 1 tablet (50 mcg total) by mouth daily.     lithium carbonate 300 MG capsule  Take 300 mg by mouth 2 (two) times daily. Takes 3 twice a day     losartan 100 MG tablet  Commonly known as:  COZAAR  TAKE 1 TABLET EVERY DAY     lovastatin 20 MG tablet  Commonly known as:  MEVACOR  TAKE 1 TABLET AT BEDTIME     metFORMIN 1000 MG tablet  Commonly known as:  GLUCOPHAGE  Take 1 tablet (1,000 mg total) by mouth 2 (two) times daily with a meal.     NOVOLIN 70/30 RELION (70-30) 100 UNIT/ML injection  Generic drug:  insulin NPH-regular Human  INJECT 50  UNITS UNDER THE SKIN THREE TIMES DAILY AS DIRECTED      omeprazole 20 MG capsule  Commonly known as:  PRILOSEC  Take 1 capsule (20 mg total) by mouth daily.     sildenafil 20 MG tablet  Commonly known as:  REVATIO  Take 1 tablet (20 mg total) by mouth daily as needed (sexual activity).     tamsulosin 0.4 MG Caps capsule  Commonly known as:  FLOMAX     VICTOZA 18 MG/3ML Sopn  Generic drug:  Liraglutide  Inject 0.2 mLs (1.2 mg total) into the skin daily. Inject once daily at the same time        Allergies: No Known Allergies  Past Medical History  Diagnosis Date  . Diabetes mellitus   . Hypertension   . Hyperlipidemia   . Bipolar II disorder - Managed by Marye Round [phone]) 05/03/2013  . Unspecified hypothyroidism 05/03/2013    Past Surgical History  Procedure Laterality Date  . Knee surgery      Family History  Problem Relation Age of Onset  . Diabetes Mother   . Hypertension Mother   . Heart disease Father   . Heart attack Father 83    Social History:  reports that he has never smoked. He does not have any smokeless tobacco history on file. He reports that he does not drink alcohol or use illicit drugs.    Review of Systems   HYPERTENSION:  Currently taking losartan 100 mg and HCTZ was added on his last visit because of persistently high blood pressure readings previously  Followed by PCP periodically Blood pressure appears controlled now      Lipids: He has had marked increase in triglycerides previously. Currently only on 20 mg lovastatin       Lab Results  Component Value Date   CHOL 90 11/13/2014   HDL 25.70* 11/13/2014   LDLCALC 36 11/13/2014   LDLDIRECT 43.0 07/24/2014   TRIG 140.0 11/13/2014   CHOLHDL 3 11/13/2014       Thyroid:  Has had mild hypothyroidism for a few years, TSH appears normal on his 50 mcg supplement. His lithium dose has been continued.    Lab Results  Component Value Date   TSH 2.71 02/13/2015        Diabetic neuropathy present.   His foot exam  in 6/15 showed  the following sensory findings: Patchy decrease in sensation in the toes especially right and decreased on the plantar surface also   LABS:  Appointment on 02/13/2015  Component Date Value Ref Range Status  . Hgb A1c MFr Bld 02/13/2015 5.9  4.6 - 6.5 % Final   Glycemic Control Guidelines for People with Diabetes:Non Diabetic:  <6%Goal of Therapy: <7%Additional Action Suggested:  >8%   . Sodium 02/13/2015 141  135 - 145 mEq/L Final  . Potassium 02/13/2015 4.3  3.5 - 5.1 mEq/L Final  . Chloride 02/13/2015 105  96 - 112 mEq/L Final  . CO2 02/13/2015 30  19 - 32 mEq/L Final  . Glucose, Bld 02/13/2015 96  70 - 99 mg/dL Final  . BUN 02/13/2015 17  6 - 23 mg/dL Final  . Creatinine, Ser 02/13/2015 1.22  0.40 - 1.50 mg/dL Final  . Calcium 02/13/2015 9.5  8.4 - 10.5 mg/dL Final  . GFR 02/13/2015 64.64  >60.00 mL/min Final  . TSH 02/13/2015 2.71  0.35 - 4.50 uIU/mL Final  . Free T4 02/13/2015 1.44  0.60 - 1.60 ng/dL Final    Physical  Examination:  BP 136/78 mmHg  Pulse 95  Temp(Src) 97.9 F (36.6 C)  Resp 16  Ht 5\' 11"  (1.803 m)  Wt 305 lb 6.4 oz (138.529 kg)  BMI 42.61 kg/m2  SpO2 97%    No edema present    ASSESSMENT/PLAN:   Diabetes type 2, uncontrolled    See history of present illness for detailed discussion of his current management, blood sugar patterns and problems identified  He has  overall fairly good control of his diabetes although A1c is apparently higher than expected for his home blood sugars Relatively speaking his A1c has increased possibly from stopping Victoza which she cannot afford at this time However did not see any consistent high readings on his monitoring; previously was tending to have low sugars overnight and was told to reduce his evening dose by 4 units He is again forgetting to do blood sugars after meals and this was discussed Also not exercising  Hypertension: better controlled with adding HCTZ  Hypothyroidism: Well controlled with 50 g  levothyroxine  He refuses to get influenza vaccine today     Recommendations:   For now he will continue same insulin regimen  If he has consistently high readings at any given time he may need to increase insulin before he goes back on Victoza in January  Bring blood sugar monitor on each visit with more postprandial readings  He will try to walk daily even while he is at work and can walk in the parking lot  or otherwise  Patient Instructions  Check blood sugars on waking up  3-4 times a week Also check blood sugars about 2 hours after a meal and do this after different meals by rotation  Recommended blood sugar levels on waking up is 90-130 and about 2 hours after meal is 130-160  Please bring your blood sugar monitor to each visit, thank you  Exercise       Grayson White 02/16/2015, 9:22 AM   Note: This office note was prepared with Dragon voice recognition system technology. Any transcriptional errors that result from this process are unintentional.

## 2015-02-15 NOTE — Patient Instructions (Signed)
Check blood sugars on waking up  3-4 times a week Also check blood sugars about 2 hours after a meal and do this after different meals by rotation  Recommended blood sugar levels on waking up is 90-130 and about 2 hours after meal is 130-160  Please bring your blood sugar monitor to each visit, thank you  Exercise

## 2015-02-16 ENCOUNTER — Ambulatory Visit: Payer: Commercial Managed Care - HMO | Admitting: Endocrinology

## 2015-02-16 ENCOUNTER — Other Ambulatory Visit: Payer: Self-pay | Admitting: Endocrinology

## 2015-02-16 DIAGNOSIS — F319 Bipolar disorder, unspecified: Secondary | ICD-10-CM | POA: Diagnosis not present

## 2015-02-27 ENCOUNTER — Other Ambulatory Visit: Payer: Self-pay | Admitting: Endocrinology

## 2015-03-03 DIAGNOSIS — Z79899 Other long term (current) drug therapy: Secondary | ICD-10-CM | POA: Diagnosis not present

## 2015-03-09 ENCOUNTER — Telehealth: Payer: Self-pay | Admitting: Endocrinology

## 2015-03-09 DIAGNOSIS — F319 Bipolar disorder, unspecified: Secondary | ICD-10-CM | POA: Diagnosis not present

## 2015-03-09 MED ORDER — AMLODIPINE BESYLATE 5 MG PO TABS: 5.0000 mg | ORAL_TABLET | Freq: Every day | ORAL | Status: DC

## 2015-03-09 NOTE — Telephone Encounter (Signed)
She is concerned about drug interaction with lithium and patient is complaining about polyuria with HCTZ. Will change to Norvasc 5 mg daily

## 2015-03-20 DIAGNOSIS — M75102 Unspecified rotator cuff tear or rupture of left shoulder, not specified as traumatic: Secondary | ICD-10-CM | POA: Diagnosis not present

## 2015-03-20 DIAGNOSIS — M25512 Pain in left shoulder: Secondary | ICD-10-CM | POA: Diagnosis not present

## 2015-03-20 DIAGNOSIS — M7552 Bursitis of left shoulder: Secondary | ICD-10-CM | POA: Diagnosis not present

## 2015-03-20 DIAGNOSIS — M7542 Impingement syndrome of left shoulder: Secondary | ICD-10-CM | POA: Diagnosis not present

## 2015-03-23 ENCOUNTER — Other Ambulatory Visit: Payer: Self-pay

## 2015-03-23 MED ORDER — METFORMIN HCL 1000 MG PO TABS: 1000.0000 mg | ORAL_TABLET | Freq: Two times a day (BID) | ORAL | Status: DC

## 2015-03-23 MED ORDER — LOVASTATIN 20 MG PO TABS: 20.0000 mg | ORAL_TABLET | Freq: Every day | ORAL | Status: DC

## 2015-03-23 NOTE — Telephone Encounter (Signed)
Rx request for Metformin 1000 mg.  Pls advise and send to [facility]. Rx for lovastatin 20 mg sent to pharmacy.

## 2015-03-27 ENCOUNTER — Other Ambulatory Visit: Payer: Self-pay | Admitting: Family Medicine

## 2015-03-30 DIAGNOSIS — F319 Bipolar disorder, unspecified: Secondary | ICD-10-CM | POA: Diagnosis not present

## 2015-04-04 ENCOUNTER — Other Ambulatory Visit: Payer: Self-pay | Admitting: Family Medicine

## 2015-04-09 ENCOUNTER — Encounter: Payer: Self-pay | Admitting: Family Medicine

## 2015-04-17 DIAGNOSIS — F319 Bipolar disorder, unspecified: Secondary | ICD-10-CM | POA: Diagnosis not present

## 2015-04-19 ENCOUNTER — Telehealth: Payer: Self-pay | Admitting: Family Medicine

## 2015-04-19 NOTE — Telephone Encounter (Signed)
Pt call to say he need to have a sleep apnea test done. He said he was referred before but he never went

## 2015-04-20 NOTE — Telephone Encounter (Signed)
Spoke with pt and gave him the number

## 2015-04-20 NOTE — Telephone Encounter (Signed)
I called the pt and offered to give him the phone number for [facility] ([phone]) to call to schedule his appt and he stated he is driving now and I offered to call him back and leave this on his voicemail and he stated he does not have a voicemail.  Stated he will call back and ask for [facility] for the phone number.

## 2015-04-20 NOTE — Telephone Encounter (Signed)
Please provide him number to call and then if they request re-referral can place. Thanks.

## 2015-04-23 DIAGNOSIS — R350 Frequency of micturition: Secondary | ICD-10-CM | POA: Diagnosis not present

## 2015-04-23 DIAGNOSIS — Z0000 Encounter for general adult medical examination without abnormal findings: Secondary | ICD-10-CM | POA: Diagnosis not present

## 2015-04-23 DIAGNOSIS — N138 Other obstructive and reflux uropathy: Secondary | ICD-10-CM | POA: Diagnosis not present

## 2015-04-23 DIAGNOSIS — N401 Enlarged prostate with lower urinary tract symptoms: Secondary | ICD-10-CM | POA: Diagnosis not present

## 2015-04-23 DIAGNOSIS — R3915 Urgency of urination: Secondary | ICD-10-CM | POA: Diagnosis not present

## 2015-04-26 ENCOUNTER — Other Ambulatory Visit: Payer: Self-pay | Admitting: Endocrinology

## 2015-05-04 DIAGNOSIS — F319 Bipolar disorder, unspecified: Secondary | ICD-10-CM | POA: Diagnosis not present

## 2015-05-11 DIAGNOSIS — F319 Bipolar disorder, unspecified: Secondary | ICD-10-CM | POA: Diagnosis not present

## 2015-05-14 DIAGNOSIS — R350 Frequency of micturition: Secondary | ICD-10-CM | POA: Diagnosis not present

## 2015-05-14 DIAGNOSIS — Z0000 Encounter for general adult medical examination without abnormal findings: Secondary | ICD-10-CM | POA: Diagnosis not present

## 2015-05-18 DIAGNOSIS — F319 Bipolar disorder, unspecified: Secondary | ICD-10-CM | POA: Diagnosis not present

## 2015-05-25 DIAGNOSIS — F319 Bipolar disorder, unspecified: Secondary | ICD-10-CM | POA: Diagnosis not present

## 2015-06-11 DIAGNOSIS — F319 Bipolar disorder, unspecified: Secondary | ICD-10-CM | POA: Diagnosis not present

## 2015-06-12 ENCOUNTER — Other Ambulatory Visit (INDEPENDENT_AMBULATORY_CARE_PROVIDER_SITE_OTHER): Payer: Commercial Managed Care - HMO

## 2015-06-12 DIAGNOSIS — E1165 Type 2 diabetes mellitus with hyperglycemia: Secondary | ICD-10-CM | POA: Diagnosis not present

## 2015-06-12 DIAGNOSIS — Z794 Long term (current) use of insulin: Secondary | ICD-10-CM | POA: Diagnosis not present

## 2015-06-12 LAB — HEMOGLOBIN A1C: HEMOGLOBIN A1C: 6.2 % (ref 4.6–6.5)

## 2015-06-12 LAB — COMPREHENSIVE METABOLIC PANEL
ALK PHOS: 41 U/L (ref 39–117)
ALT: 31 U/L (ref 0–53)
AST: 26 U/L (ref 0–37)
Albumin: 4.1 g/dL (ref 3.5–5.2)
BUN: 11 mg/dL (ref 6–23)
CHLORIDE: 106 meq/L (ref 96–112)
CO2: 26 meq/L (ref 19–32)
Calcium: 9.4 mg/dL (ref 8.4–10.5)
Creatinine, Ser: 1.07 mg/dL (ref 0.40–1.50)
GFR: 75.12 mL/min (ref >60.00)
GLUCOSE: 148 mg/dL — AB (ref 70–99)
POTASSIUM: 3.9 meq/L (ref 3.5–5.1)
SODIUM: 140 meq/L (ref 135–145)
Total Bilirubin: 0.6 mg/dL (ref 0.2–1.2)
Total Protein: 6.6 g/dL (ref 6.0–8.3)

## 2015-06-13 LAB — FRUCTOSAMINE: FRUCTOSAMINE: 292 umol/L — AB (ref 0–285)

## 2015-06-15 ENCOUNTER — Ambulatory Visit (INDEPENDENT_AMBULATORY_CARE_PROVIDER_SITE_OTHER): Payer: Commercial Managed Care - HMO | Admitting: Endocrinology

## 2015-06-15 DIAGNOSIS — I10 Essential (primary) hypertension: Secondary | ICD-10-CM

## 2015-06-15 DIAGNOSIS — E038 Other specified hypothyroidism: Secondary | ICD-10-CM

## 2015-06-15 DIAGNOSIS — Z794 Long term (current) use of insulin: Secondary | ICD-10-CM

## 2015-06-15 DIAGNOSIS — I152 Hypertension secondary to endocrine disorders: Secondary | ICD-10-CM

## 2015-06-15 DIAGNOSIS — E1159 Type 2 diabetes mellitus with other circulatory complications: Secondary | ICD-10-CM | POA: Diagnosis not present

## 2015-06-15 DIAGNOSIS — E1165 Type 2 diabetes mellitus with hyperglycemia: Secondary | ICD-10-CM

## 2015-06-15 DIAGNOSIS — E785 Hyperlipidemia, unspecified: Secondary | ICD-10-CM | POA: Diagnosis not present

## 2015-06-15 NOTE — Progress Notes (Signed)
Patient ID: Matthew Barnes, male   DOB: 01-May-1955, 60 y.o.   MRN: [id number]    Reason for Appointment: Followup for Type 2 Diabetes  Referring physician:  Maudie Mercury  History of Present Illness:          Diagnosis: Type 2 diabetes mellitus, date of diagnosis:   2011       Past history:  He was started on metformin at diagnosis and apparently did have fairly good control initially However about a year later because of poor control he was given insulin in addition. He had been taking Lantus insulin until about 2/15, up to 80 units a day However he does not think his blood sugars  Were controlled with this Because of higher A1c of 13.1% he was referred here for diabetes management in 6/15; was started on glucose monitoring and insulin was changed from low dose Levemir to Humalog mix 70 units a day He was also started on Victoza  to help with blood sugar control and facilitate weight loss on his initial consultation  Recent history:   INSULIN regimen is described as: Novolin mix 70/30, 50 units ac bid-tid using syringe   A1c is still lower than expected for his blood sugars but getting progressively higher, previously 5.9 Difficult to assess his home blood sugar patterns as he is checking blood sugars very infrequently Fructosamine is relatively higher, now 292  He has gone off Victoza because of cost    Current blood sugar patterns, management of diabetes and problems:   He is checking blood sugar very sporadically and has only 2 good readings, otherwise they are mostly significantly high  Blood sugars are probably higher nonfasting and the evenings  He says that because of his very busy work schedule he does not get the time to take his insulin or he does not want to take it before starting to drive because of  inconsistent food intake and also fear of hypoglycemia  Still not motivated to exercise  His dietary compliance is limited by his needing to eat out frequently and also some  stress eating   Oral hypoglycemic drugs the patient is taking are: Metformin 1 g twice a day      Side effects from medications have been: None  Glucose monitoring:  done less than 1 times a day         Glucometer:  One Touch Verio    Blood Glucose readings from review of monitor as below, checking mostly in the morning, has only 7 readings in the last month  Mean values apply above for all meters except median for One Touch  PRE-MEAL Fasting Lunch Dinner Bedtime Overall  Glucose range: 103-284   241  210, 266    Mean/median: 180     210     Glycemic control:   Lab Results  Component Value Date   HGBA1C 6.2 06/12/2015   HGBA1C 5.9 02/13/2015   HGBA1C 5.3 11/13/2014   Lab Results  Component Value Date   MICROALBUR <0.7 08/16/2014   LDLCALC 36 11/13/2014   CREATININE 1.07 06/12/2015    Self-care: The diet that the patient has been following is: None, unable to control portions when depressed  Meals: 3 meals per day (dinner 6 pm-10 pm)         Exercise: not  doing walking Or other exercise        Dietician visit: Most recent: never      CDE visit: 08/2013  Weight history: baseline 295  Wt Readings from Last 3 Encounters:  02/15/15 305 lb 6.4 oz (138.529 kg)  12/14/14 300 lb 9.6 oz (136.351 kg)  11/16/14 301 lb 3.2 oz (136.623 kg)   Lab on 06/12/2015  Component Date Value Ref Range Status  . Hgb A1c MFr Bld 06/12/2015 6.2  4.6 - 6.5 % Final   Glycemic Control Guidelines for People with Diabetes:Non Diabetic:  <6%Goal of Therapy: <7%Additional Action Suggested:  >8%   . Sodium 06/12/2015 140  135 - 145 mEq/L Final  . Potassium 06/12/2015 3.9  3.5 - 5.1 mEq/L Final  . Chloride 06/12/2015 106  96 - 112 mEq/L Final  . CO2 06/12/2015 26  19 - 32 mEq/L Final  . Glucose, Bld 06/12/2015 148* 70 - 99 mg/dL Final  . BUN 06/12/2015 11  6 - 23 mg/dL Final  . Creatinine, Ser 06/12/2015 1.07  0.40 - 1.50 mg/dL Final  . Total Bilirubin 06/12/2015 0.6  0.2 - 1.2 mg/dL  Final  . Alkaline Phosphatase 06/12/2015 41  39 - 117 U/L Final  . AST 06/12/2015 26  0 - 37 U/L Final  . ALT 06/12/2015 31  0 - 53 U/L Final  . Total Protein 06/12/2015 6.6  6.0 - 8.3 g/dL Final  . Albumin 06/12/2015 4.1  3.5 - 5.2 g/dL Final  . Calcium 06/12/2015 9.4  8.4 - 10.5 mg/dL Final  . GFR 06/12/2015 75.12  >60.00 mL/min Final  . Fructosamine 06/12/2015 292* 0 - 285 umol/L Final   Comment: Published reference interval for apparently healthy subjects between age 25 and 83 is 61 - 285 umol/L and in a poorly controlled diabetic population is 228 - 563 umol/L with a mean of 396 umol/L.         Medication List       This list is accurate as of: 06/15/15 11:59 PM.  Always use your most recent med list.               amLODipine 5 MG tablet  Commonly known as:  NORVASC  Take 1 tablet (5 mg total) by mouth daily.     buPROPion 100 MG 12 hr tablet  Commonly known as:  WELLBUTRIN SR  Take 100 mg by mouth 2 (two) times daily.     citalopram 20 MG tablet  Commonly known as:  CELEXA     EQ LORATADINE 10 MG tablet  Generic drug:  loratadine  TAKE ONE TABLET BY MOUTH ONCE DAILY.     EQ LORATADINE 10 MG tablet  Generic drug:  loratadine  TAKE ONE TABLET BY MOUTH ONCE DAILY     INSULIN SYRINGE 1CC/31GX5/16" 31G X 5/16" 1 ML Misc  Use 3 needles per day     lamoTRIgine 100 MG tablet  Commonly known as:  LAMICTAL  Take by mouth. 2 by mouth once a day     levothyroxine 50 MCG tablet  Commonly known as:  SYNTHROID, LEVOTHROID  Take 1 tablet (50 mcg total) by mouth daily.     levothyroxine 50 MCG tablet  Commonly known as:  SYNTHROID, LEVOTHROID  TAKE ONE TABLET BY MOUTH ONCE DAILY     lithium 600 MG capsule  Take 600 mg by mouth 3 (three) times daily. Abstracted from Dr Adegoroye's note     losartan 100 MG tablet  Commonly known as:  COZAAR  TAKE 1 TABLET EVERY DAY     lovastatin 20 MG tablet  Commonly known as:  MEVACOR  Take 1  tablet (20 mg total) by mouth  at bedtime.     metFORMIN 1000 MG tablet  Commonly known as:  GLUCOPHAGE  Take 1 tablet (1,000 mg total) by mouth 2 (two) times daily with a meal.     NOVOLIN 70/30 RELION (70-30) 100 UNIT/ML injection  Generic drug:  insulin NPH-regular Human  INJECT 50 UNITS UNDER THE SKIN THREE TIMES DAILY AS DIRECTED     omeprazole 20 MG capsule  Commonly known as:  PRILOSEC  TAKE ONE CAPSULE BY MOUTH EVERY DAY     ONETOUCH VERIO test strip  Generic drug:  glucose blood  USE ONE STRIP TO CHECK BLOOD GLUCOSE TWICE DAILY AS DIRECTED     OXcarbazepine 150 MG tablet  Commonly known as:  TRILEPTAL  Take 150 mg by mouth 2 (two) times daily. Abstracted from Dr Adegoroye's note     sildenafil 20 MG tablet  Commonly known as:  REVATIO  Take 1 tablet (20 mg total) by mouth daily as needed (sexual activity).     tamsulosin 0.4 MG Caps capsule  Commonly known as:  FLOMAX     VICTOZA 18 MG/3ML Sopn  Generic drug:  Liraglutide  Inject 0.2 mLs (1.2 mg total) into the skin daily. Inject once daily at the same time        Allergies: No Known Allergies  Past Medical History  Diagnosis Date  . Diabetes mellitus   . Hypertension   . Hyperlipidemia   . Bipolar II disorder - Managed by Marye Round [phone]) 05/03/2013  . Unspecified hypothyroidism 05/03/2013    Past Surgical History  Procedure Laterality Date  . Knee surgery      Family History  Problem Relation Age of Onset  . Diabetes Mother   . Hypertension Mother   . Heart disease Father   . Heart attack Father 19    Social History:  reports that he has never smoked. He does not have any smokeless tobacco history on file. He reports that he does not drink alcohol or use illicit drugs.    Review of Systems   HYPERTENSION:  Currently taking losartan 100 mg and HCTZ  Followed by PCP periodically      LIPIDS: He has had marked increase in triglycerides previously. Currently only on 20 mg lovastatiWith improved control  except low HDL       Lab Results  Component Value Date   CHOL 90 11/13/2014   HDL 25.70* 11/13/2014   LDLCALC 36 11/13/2014   LDLDIRECT 43.0 07/24/2014   TRIG 140.0 11/13/2014   CHOLHDL 3 11/13/2014       Thyroid:  Has had mild hypothyroidism for a few years, TSH appears normal on his 50 mcg supplement.  Also on lithium   Lab Results  Component Value Date   TSH 2.71 02/13/2015        Diabetic neuropathy present.   His foot exam  in 6/15 showed the following sensory findings: Patchy decrease in sensation in the toes especially right and decreased on the plantar surface also   LABS:  Lab on 06/12/2015  Component Date Value Ref Range Status  . Hgb A1c MFr Bld 06/12/2015 6.2  4.6 - 6.5 % Final   Glycemic Control Guidelines for People with Diabetes:Non Diabetic:  <6%Goal of Therapy: <7%Additional Action Suggested:  >8%   . Sodium 06/12/2015 140  135 - 145 mEq/L Final  . Potassium 06/12/2015 3.9  3.5 - 5.1 mEq/L Final  . Chloride 06/12/2015 106  96 - 112  mEq/L Final  . CO2 06/12/2015 26  19 - 32 mEq/L Final  . Glucose, Bld 06/12/2015 148* 70 - 99 mg/dL Final  . BUN 06/12/2015 11  6 - 23 mg/dL Final  . Creatinine, Ser 06/12/2015 1.07  0.40 - 1.50 mg/dL Final  . Total Bilirubin 06/12/2015 0.6  0.2 - 1.2 mg/dL Final  . Alkaline Phosphatase 06/12/2015 41  39 - 117 U/L Final  . AST 06/12/2015 26  0 - 37 U/L Final  . ALT 06/12/2015 31  0 - 53 U/L Final  . Total Protein 06/12/2015 6.6  6.0 - 8.3 g/dL Final  . Albumin 06/12/2015 4.1  3.5 - 5.2 g/dL Final  . Calcium 06/12/2015 9.4  8.4 - 10.5 mg/dL Final  . GFR 06/12/2015 75.12  >60.00 mL/min Final  . Fructosamine 06/12/2015 292* 0 - 285 umol/L Final   Comment: Published reference interval for apparently healthy subjects between age 90 and 70 is 44 - 285 umol/L and in a poorly controlled diabetic population is 228 - 563 umol/L with a mean of 396 umol/L.     Physical Examination:  There were no vitals taken for this  visit.       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  This will improve his compliance significantly and will be able to adjust his mealtime coverage with more flexibility as well as enable better compliance with the dosing at mealtimes with his lifestyle  Needs to do better with his glucose monitoring  Consider consultation with dietitian also  May also consider doing a continuous glucose monitoring and recording for better analysis of his blood sugar patterns and he can also keep a record of his food intake with this  He will be scheduled to start a trial with the nurse educator, prior authorization information sent to the company Follow-up within a week after starting the V-go pump with 40 units basal BOLUSES: 8-10 units based on meal size, may need more if post prandial readings are higher  There are no Patient Instructions on file for this visit.  Counseling time on subjects discussed above is over 50% of today's 25 minute visit   Jaken Fregia 06/16/2015, 6:46 PM   Note: This office note was prepared with Dragon voice recognition system technology. Any transcriptional errors that result from this process are unintentional.

## 2015-06-18 ENCOUNTER — Telehealth: Payer: Self-pay | Admitting: Endocrinology

## 2015-06-18 NOTE — Telephone Encounter (Signed)
Please see below.

## 2015-06-18 NOTE — Telephone Encounter (Signed)
FYI 306.78 copayment with deductible for the vgo they are calling the pt to let them know

## 2015-06-18 NOTE — Telephone Encounter (Signed)
noted 

## 2015-06-25 ENCOUNTER — Encounter: Payer: Self-pay | Admitting: Family Medicine

## 2015-06-25 ENCOUNTER — Ambulatory Visit (INDEPENDENT_AMBULATORY_CARE_PROVIDER_SITE_OTHER): Payer: Commercial Managed Care - HMO | Admitting: Family Medicine

## 2015-06-25 VITALS — BP 132/88 | HR 94 | Temp 99.7°F | Ht 71.0 in | Wt 300.0 lb

## 2015-06-25 DIAGNOSIS — M545 Low back pain, unspecified: Secondary | ICD-10-CM

## 2015-06-25 DIAGNOSIS — R350 Frequency of micturition: Secondary | ICD-10-CM

## 2015-06-25 LAB — POCT URINALYSIS DIPSTICK
Bilirubin, UA: NEGATIVE
Blood, UA: NEGATIVE
Glucose, UA: NEGATIVE
Ketones, UA: NEGATIVE
LEUKOCYTES UA: NEGATIVE
Nitrite, UA: NEGATIVE
PH UA: 5
PROTEIN UA: NEGATIVE
Spec Grav, UA: <=1.005
UROBILINOGEN UA: 0.2

## 2015-06-25 MED ORDER — CYCLOBENZAPRINE HCL 10 MG PO TABS: 10.0000 mg | ORAL_TABLET | Freq: Every day | ORAL | Status: DC

## 2015-06-25 NOTE — Progress Notes (Signed)
Pre visit review using our clinic review tool, if applicable. No additional management support is needed unless otherwise documented below in the visit note. 

## 2015-06-25 NOTE — Patient Instructions (Signed)
BEFORE YOU LEAVE: -low back exercises -follow up in 1 month for back pain or sooner if needed  Flexeril at night for back spasm as needed.  Exercises 4 days per week.  Follow up with your urologist about urinary symptoms.

## 2015-06-25 NOTE — Progress Notes (Signed)
HPI:  Matthew Barnes is a pleasant 60 yo with controlled diabetes, HTN, HLD, bipolar disorder and a long history of voiding symptoms (seeing urologist) here for an acute visit for urinary frequency. Reports longstanding urinary frequency, then had some L low back pain for the last few days, now improving. However, he was concerned for a UTI. The back pain is moderate, occurs with certain movements. No radiation, weakness, numbness, fevers, hematuria or malaise. He is undergoing evaluation and management of his longstanding dysuria with urologist and has follow up in a few weeks. He is currently seeing an endocrinologist for his diabetes and reports blood sugars have been better.  ROS: See pertinent positives and negatives per HPI.  Past Medical History  Diagnosis Date  . Diabetes mellitus   . Hypertension   . Hyperlipidemia   . Bipolar II disorder - Managed by Marye Round [phone]) 05/03/2013  . Unspecified hypothyroidism 05/03/2013    Past Surgical History  Procedure Laterality Date  . Knee surgery      Family History  Problem Relation Age of Onset  . Diabetes Mother   . Hypertension Mother   . Heart disease Father   . Heart attack Father 20    Social History   Social History  . Marital Status: Single    Spouse Name: N/A  . Number of Children: N/A  . Years of Education: N/A   Social History Main Topics  . Smoking status: Never Smoker   . Smokeless tobacco: None  . Alcohol Use: No  . Drug Use: No  . Sexual Activity: No   Other Topics Concern  . None   Social History Narrative   Work or School: on disability for knee arthritis      Home Situation: lives alone      Spiritual Beliefs:Christian      Lifestyle:no regular exercising; diet not great              Current outpatient prescriptions:  .  amLODipine (NORVASC) 5 MG tablet, Take 1 tablet (5 mg total) by mouth daily., Disp: 30 tablet, Rfl: 3 .  buPROPion (WELLBUTRIN SR) 100 MG 12 hr tablet, Take  100 mg by mouth 2 (two) times daily. , Disp: , Rfl:  .  citalopram (CELEXA) 20 MG tablet, , Disp: , Rfl:  .  EQ LORATADINE 10 MG tablet, TAKE ONE TABLET BY MOUTH ONCE DAILY., Disp: 30 tablet, Rfl: 0 .  Insulin Syringe-Needle U-100 (INSULIN SYRINGE 1CC/31GX5/16") 31G X 5/16" 1 ML MISC, Use 3 needles per day, Disp: 100 each, Rfl: 3 .  lamoTRIgine (LAMICTAL) 100 MG tablet, Take by mouth. 2 by mouth once a day, Disp: , Rfl:  .  levothyroxine (SYNTHROID, LEVOTHROID) 50 MCG tablet, Take 1 tablet (50 mcg total) by mouth daily., Disp: 30 tablet, Rfl: 3 .  levothyroxine (SYNTHROID, LEVOTHROID) 50 MCG tablet, TAKE ONE TABLET BY MOUTH ONCE DAILY, Disp: 30 tablet, Rfl: 5 .  lithium 600 MG capsule, Take 600 mg by mouth 3 (three) times daily. Abstracted from Dr Adegoroye's note, Disp: , Rfl:  .  losartan (COZAAR) 100 MG tablet, TAKE 1 TABLET EVERY DAY, Disp: 90 tablet, Rfl: 0 .  lovastatin (MEVACOR) 20 MG tablet, Take 1 tablet (20 mg total) by mouth at bedtime., Disp: 90 tablet, Rfl: 0 .  metFORMIN (GLUCOPHAGE) 1000 MG tablet, Take 1 tablet (1,000 mg total) by mouth 2 (two) times daily with a meal., Disp: 180 tablet, Rfl: 0 .  NOVOLIN 70/30 RELION (70-30) 100 UNIT/ML  injection, INJECT 50 UNITS UNDER THE SKIN THREE TIMES DAILY AS DIRECTED, Disp: 30 mL, Rfl: 2 .  omeprazole (PRILOSEC) 20 MG capsule, TAKE ONE CAPSULE BY MOUTH EVERY DAY, Disp: 90 capsule, Rfl: 1 .  ONETOUCH VERIO test strip, USE ONE STRIP TO CHECK BLOOD GLUCOSE TWICE DAILY AS DIRECTED, Disp: 100 each, Rfl: 2 .  OXcarbazepine (TRILEPTAL) 150 MG tablet, Take 150 mg by mouth 2 (two) times daily. Abstracted from Dr Adegoroye's note, Disp: , Rfl:  .  sildenafil (REVATIO) 20 MG tablet, Take 1 tablet (20 mg total) by mouth daily as needed (sexual activity)., Disp: 10 tablet, Rfl: 0 .  tamsulosin (FLOMAX) 0.4 MG CAPS capsule, , Disp: , Rfl:  .  VICTOZA 18 MG/3ML SOPN, Inject 0.2 mLs (1.2 mg total) into the skin daily. Inject once daily at the same time,  Disp: 2 pen, Rfl: 3 .  cyclobenzaprine (FLEXERIL) 10 MG tablet, Take 1 tablet (10 mg total) by mouth at bedtime., Disp: 15 tablet, Rfl: 0  EXAM:  Filed Vitals:   06/25/15 1624  BP: 132/88  Pulse: 94  Temp: 99.7 F (37.6 C)  NOTE: there was no temperature available at the time of the appointment due to pt drinking a hot beverage. It was checked after the visit but I was not notified.  Body mass index is 41.86 kg/(m^2).  GENERAL: vitals reviewed and listed above, alert, oriented, appears well hydrated and in no acute distress  HEENT: atraumatic, conjunttiva clear, no obvious abnormalities on inspection of external nose and ears  NECK: no obvious masses on inspection  LUNGS: clear to auscultation bilaterally, no wheezes, rales or rhonchi, good air movement  CV: HRRR, no peripheral edema  ABD: no CVA TTP  MS: moves all extremities without noticeable abnormality Normal Gait Normal inspection of back, no obvious scoliosis or leg length descrepancy No bony TTP Soft tissue TTP at: L quad lumb muscle -/+ tests: neg trendelenburg,-facet loading, -SLRT, -CLRT, normal gait  PSYCH: pleasant and cooperative, no obvious depression or anxiety  ASSESSMENT AND PLAN:  Discussed the following assessment and plan:  Urinary frequency - Plan: POC Urinalysis Dipstick  Left-sided low back pain without sciatica  -udip normal, pt will follow up with urologist -muscular soreness on exam, query strain as most likely cause of his low back pain - advised conservative management with heat, muscle relaxer, HEP, tylenol prn and prompt re-evaluation if symptoms worsen, new symptoms arise or symptoms persist - seem to be resolving   Patient Instructions  BEFORE YOU LEAVE: -low back exercises -follow up in 1 month for back pain or sooner if needed  Flexeril at night for back spasm as needed.  Exercises 4 days per week.  Follow up with your urologist about urinary symptoms.       Colin Benton R.

## 2015-06-26 DIAGNOSIS — F319 Bipolar disorder, unspecified: Secondary | ICD-10-CM | POA: Diagnosis not present

## 2015-06-29 ENCOUNTER — Telehealth: Payer: Self-pay | Admitting: Endocrinology

## 2015-06-29 NOTE — Telephone Encounter (Signed)
vgo customer care calling with determination the pt is covered under part d copayment 306.78 deductible is involved but doesn't say what that is, pt is aware

## 2015-07-17 DIAGNOSIS — F319 Bipolar disorder, unspecified: Secondary | ICD-10-CM | POA: Diagnosis not present

## 2015-07-23 DIAGNOSIS — R3915 Urgency of urination: Secondary | ICD-10-CM | POA: Diagnosis not present

## 2015-07-23 DIAGNOSIS — R350 Frequency of micturition: Secondary | ICD-10-CM | POA: Diagnosis not present

## 2015-07-23 DIAGNOSIS — N138 Other obstructive and reflux uropathy: Secondary | ICD-10-CM | POA: Diagnosis not present

## 2015-07-23 DIAGNOSIS — Z0000 Encounter for general adult medical examination without abnormal findings: Secondary | ICD-10-CM | POA: Diagnosis not present

## 2015-07-23 DIAGNOSIS — N401 Enlarged prostate with lower urinary tract symptoms: Secondary | ICD-10-CM | POA: Diagnosis not present

## 2015-07-23 DIAGNOSIS — Z79899 Other long term (current) drug therapy: Secondary | ICD-10-CM | POA: Diagnosis not present

## 2015-07-30 DIAGNOSIS — Z79899 Other long term (current) drug therapy: Secondary | ICD-10-CM | POA: Diagnosis not present

## 2015-08-01 ENCOUNTER — Institutional Professional Consult (permissible substitution): Payer: Commercial Managed Care - HMO | Admitting: Pulmonary Disease

## 2015-08-03 DIAGNOSIS — F319 Bipolar disorder, unspecified: Secondary | ICD-10-CM | POA: Diagnosis not present

## 2015-08-13 DIAGNOSIS — F319 Bipolar disorder, unspecified: Secondary | ICD-10-CM | POA: Diagnosis not present

## 2015-08-16 DIAGNOSIS — M75102 Unspecified rotator cuff tear or rupture of left shoulder, not specified as traumatic: Secondary | ICD-10-CM | POA: Diagnosis not present

## 2015-08-16 DIAGNOSIS — M25512 Pain in left shoulder: Secondary | ICD-10-CM | POA: Diagnosis not present

## 2015-08-16 DIAGNOSIS — M7542 Impingement syndrome of left shoulder: Secondary | ICD-10-CM | POA: Diagnosis not present

## 2015-08-16 DIAGNOSIS — M7552 Bursitis of left shoulder: Secondary | ICD-10-CM | POA: Diagnosis not present

## 2015-08-19 DIAGNOSIS — M25512 Pain in left shoulder: Secondary | ICD-10-CM | POA: Diagnosis not present

## 2015-08-19 DIAGNOSIS — W19XXXA Unspecified fall, initial encounter: Secondary | ICD-10-CM | POA: Diagnosis not present

## 2015-08-29 ENCOUNTER — Telehealth: Payer: Self-pay | Admitting: Endocrinology

## 2015-08-29 NOTE — Telephone Encounter (Signed)
Patient has been approved for the V-go, please advise what size you want sent in?

## 2015-08-29 NOTE — Telephone Encounter (Signed)
Pt requests call back from you with questions about his meter.

## 2015-08-29 NOTE — Telephone Encounter (Signed)
40 unit

## 2015-08-30 ENCOUNTER — Other Ambulatory Visit: Payer: Self-pay | Admitting: *Deleted

## 2015-08-30 MED ORDER — V-GO 40 KIT
PACK | Status: DC
Start: 2015-08-30 — End: 2016-04-04

## 2015-08-30 NOTE — Telephone Encounter (Signed)
Noted, rx sent.

## 2015-08-31 DIAGNOSIS — F319 Bipolar disorder, unspecified: Secondary | ICD-10-CM | POA: Diagnosis not present

## 2015-09-11 ENCOUNTER — Encounter: Payer: Commercial Managed Care - HMO | Admitting: Nutrition

## 2015-09-13 ENCOUNTER — Other Ambulatory Visit: Payer: Self-pay | Admitting: Family Medicine

## 2015-09-28 DIAGNOSIS — F319 Bipolar disorder, unspecified: Secondary | ICD-10-CM | POA: Diagnosis not present

## 2015-10-05 ENCOUNTER — Other Ambulatory Visit: Payer: Self-pay | Admitting: Family Medicine

## 2015-10-06 ENCOUNTER — Other Ambulatory Visit: Payer: Self-pay | Admitting: Endocrinology

## 2015-10-12 ENCOUNTER — Other Ambulatory Visit: Payer: Self-pay | Admitting: Endocrinology

## 2015-10-23 ENCOUNTER — Institutional Professional Consult (permissible substitution): Payer: Commercial Managed Care - HMO | Admitting: Pulmonary Disease

## 2015-11-23 DIAGNOSIS — F3162 Bipolar disorder, current episode mixed, moderate: Secondary | ICD-10-CM | POA: Diagnosis not present

## 2015-11-25 ENCOUNTER — Other Ambulatory Visit: Payer: Self-pay | Admitting: Endocrinology

## 2015-11-26 DIAGNOSIS — M25561 Pain in right knee: Secondary | ICD-10-CM | POA: Diagnosis not present

## 2015-11-26 DIAGNOSIS — M170 Bilateral primary osteoarthritis of knee: Secondary | ICD-10-CM | POA: Diagnosis not present

## 2015-12-04 ENCOUNTER — Telehealth: Payer: Self-pay | Admitting: Endocrinology

## 2015-12-04 ENCOUNTER — Other Ambulatory Visit: Payer: Self-pay | Admitting: *Deleted

## 2015-12-04 MED ORDER — INSULIN NPH ISOPHANE & REGULAR (70-30) 100 UNIT/ML ~~LOC~~ SUSP: SUBCUTANEOUS | 3 refills | Status: DC

## 2015-12-04 NOTE — Telephone Encounter (Signed)
Rx sent per patient request  

## 2015-12-04 NOTE — Telephone Encounter (Signed)
Patient need refill today NOVOLIN 70/30 RELION (70-30) 100 UNIT/ML injection [address] [place], [place] [phone] (Phone) [phone] (Fax)

## 2015-12-05 ENCOUNTER — Other Ambulatory Visit: Payer: Commercial Managed Care - HMO

## 2015-12-05 ENCOUNTER — Other Ambulatory Visit: Payer: Self-pay | Admitting: *Deleted

## 2015-12-05 MED ORDER — INSULIN NPH ISOPHANE & REGULAR (70-30) 100 UNIT/ML ~~LOC~~ SUSP: SUBCUTANEOUS | 3 refills | Status: DC

## 2015-12-07 ENCOUNTER — Ambulatory Visit: Payer: Commercial Managed Care - HMO | Admitting: Endocrinology

## 2015-12-07 DIAGNOSIS — H5210 Myopia, unspecified eye: Secondary | ICD-10-CM | POA: Diagnosis not present

## 2015-12-07 DIAGNOSIS — H524 Presbyopia: Secondary | ICD-10-CM | POA: Diagnosis not present

## 2015-12-07 LAB — HM DIABETES EYE EXAM

## 2015-12-11 DIAGNOSIS — F3162 Bipolar disorder, current episode mixed, moderate: Secondary | ICD-10-CM | POA: Diagnosis not present

## 2015-12-12 ENCOUNTER — Other Ambulatory Visit: Payer: Self-pay | Admitting: *Deleted

## 2015-12-12 DIAGNOSIS — E039 Hypothyroidism, unspecified: Secondary | ICD-10-CM

## 2015-12-12 DIAGNOSIS — E1142 Type 2 diabetes mellitus with diabetic polyneuropathy: Secondary | ICD-10-CM

## 2015-12-18 DIAGNOSIS — Z79899 Other long term (current) drug therapy: Secondary | ICD-10-CM | POA: Diagnosis not present

## 2015-12-20 ENCOUNTER — Other Ambulatory Visit (INDEPENDENT_AMBULATORY_CARE_PROVIDER_SITE_OTHER): Payer: Commercial Managed Care - HMO

## 2015-12-20 DIAGNOSIS — E039 Hypothyroidism, unspecified: Secondary | ICD-10-CM

## 2015-12-20 DIAGNOSIS — F3162 Bipolar disorder, current episode mixed, moderate: Secondary | ICD-10-CM | POA: Diagnosis not present

## 2015-12-20 DIAGNOSIS — E1142 Type 2 diabetes mellitus with diabetic polyneuropathy: Secondary | ICD-10-CM | POA: Diagnosis not present

## 2015-12-20 LAB — BASIC METABOLIC PANEL
BUN: 17 mg/dL (ref 6–23)
CO2: 31 meq/L (ref 19–32)
Calcium: 8.8 mg/dL (ref 8.4–10.5)
Chloride: 107 mEq/L (ref 96–112)
Creatinine, Ser: 1.12 mg/dL (ref 0.40–1.50)
GFR: 71.13 mL/min (ref >60.00)
GLUCOSE: 249 mg/dL — AB (ref 70–99)
POTASSIUM: 3.9 meq/L (ref 3.5–5.1)
SODIUM: 141 meq/L (ref 135–145)

## 2015-12-20 LAB — LIPID PANEL
CHOL/HDL RATIO: 3
Cholesterol: 87 mg/dL (ref 0–200)
HDL: 24.9 mg/dL — AB (ref >39.00)
LDL Cholesterol: 27 mg/dL (ref 0–99)
NONHDL: 61.94
TRIGLYCERIDES: 174 mg/dL — AB (ref 0.0–149.0)
VLDL: 34.8 mg/dL (ref 0.0–40.0)

## 2015-12-20 LAB — HEMOGLOBIN A1C: Hgb A1c MFr Bld: 7.7 % — ABNORMAL HIGH (ref 4.6–6.5)

## 2015-12-20 LAB — T4, FREE: Free T4: 0.87 ng/dL (ref 0.60–1.60)

## 2015-12-20 LAB — TSH: TSH: 1.22 u[IU]/mL (ref 0.35–4.50)

## 2015-12-25 ENCOUNTER — Encounter: Payer: Self-pay | Admitting: Endocrinology

## 2015-12-25 ENCOUNTER — Ambulatory Visit (INDEPENDENT_AMBULATORY_CARE_PROVIDER_SITE_OTHER): Payer: Commercial Managed Care - HMO | Admitting: Endocrinology

## 2015-12-25 VITALS — BP 149/90 | HR 100 | Temp 98.2°F | Resp 16 | Ht 71.0 in | Wt 300.6 lb

## 2015-12-25 DIAGNOSIS — E1165 Type 2 diabetes mellitus with hyperglycemia: Secondary | ICD-10-CM

## 2015-12-25 DIAGNOSIS — Z794 Long term (current) use of insulin: Secondary | ICD-10-CM

## 2015-12-25 DIAGNOSIS — I10 Essential (primary) hypertension: Secondary | ICD-10-CM | POA: Diagnosis not present

## 2015-12-25 DIAGNOSIS — E782 Mixed hyperlipidemia: Secondary | ICD-10-CM | POA: Diagnosis not present

## 2015-12-25 NOTE — Patient Instructions (Addendum)
Insulin just before eating: 50---60---40 units  Walking daily

## 2015-12-25 NOTE — Progress Notes (Signed)
Patient ID: Matthew Barnes, male   DOB: 04-Sep-1955, 60 y.o.   MRN: [id number]    Reason for Appointment: Followup for Type 2 Diabetes  Referring physician:  Maudie Mercury  History of Present Illness:          Diagnosis: Type 2 diabetes mellitus, date of diagnosis:   2011       Past history:  He was started on metformin at diagnosis and apparently did have fairly good control initially However about a year later because of poor control he was given insulin in addition. He had been taking Lantus insulin until about 2/15, up to 80 units a day However he does not think his blood sugars  Were controlled with this Because of higher A1c of 13.1% he was referred here for diabetes management in 6/15; was started on glucose monitoring and insulin was changed from low dose Levemir to Humalog mix 70 units a day He was also started on Victoza  to help with blood sugar control and facilitate weight loss on his initial consultation  Recent history:   INSULIN regimen is described as: Novolin mix 70/30, 50 units ac -tid using syringe   A1c is much higher at 7.7, previously 6.2 He says that he did not have insulin for about 3 months and did not ask for refill or make an appointment  He has gone off Victoza because of cost    Current blood sugar patterns, management of diabetes and problems:   He is checking blood sugar at different times and more often than usual but still only about once a day on an average  Even though he has been taking his insulin regularly for at least a week his blood sugars are generally higher in the afternoons and early evening  Occasionally has higher readings midday also but fasting readings are generally near normal  No hypoglycemia currently  He has started back on metformin also only recently  His blood sugars are generally averaging over 200 in the afternoon  Still not motivated to exercise aerobically and doing only some weights  His dietary compliance is limited by his  needing to eat out frequently and also some stress eating with carbohydrates   Oral hypoglycemic drugs the patient is taking are: Metformin 1 g twice a day      Side effects from medications have been: None  Glucose monitoring:  done less than 1 times a day         Glucometer:  One Touch Verio    Blood Glucose readings from review of monitor as below, checking mostly in the morning, has only 7 readings in the last month  Mean values apply above for all meters except median for One Touch  PRE-MEAL Fasting Lunch Dinner Bedtime Overall  Glucose range: 96-147  119-376  136-320  130-269  96-376   Mean/median:    180  194    Glycemic control:   Lab Results  Component Value Date   HGBA1C 7.7 (H) 12/20/2015   HGBA1C 6.2 06/12/2015   HGBA1C 5.9 02/13/2015   Lab Results  Component Value Date   MICROALBUR <0.7 08/16/2014   LDLCALC 27 12/20/2015   CREATININE 1.12 12/20/2015    Self-care: The diet that the patient has been following is: None, unable to control portions and carbs  when depressed  Meals: 3 meals per day (dinner 6 pm-10 pm)          Exercise: not walking Recently  Dietician visit: Most recent: never      CDE visit: 08/2013             Weight history: baseline 295  Wt Readings from Last 3 Encounters:  12/25/15 (!) 300 lb 9.6 oz (136.4 kg)  06/25/15 300 lb (136.1 kg)  02/15/15 (!) 305 lb 6.4 oz (138.5 kg)   Lab on 12/20/2015  Component Date Value Ref Range Status  . Hgb A1c MFr Bld 12/20/2015 7.7* 4.6 - 6.5 % Final  . Sodium 12/20/2015 141  135 - 145 mEq/L Final  . Potassium 12/20/2015 3.9  3.5 - 5.1 mEq/L Final  . Chloride 12/20/2015 107  96 - 112 mEq/L Final  . CO2 12/20/2015 31  19 - 32 mEq/L Final  . Glucose, Bld 12/20/2015 249* 70 - 99 mg/dL Final  . BUN 12/20/2015 17  6 - 23 mg/dL Final  . Creatinine, Ser 12/20/2015 1.12  0.40 - 1.50 mg/dL Final  . Calcium 12/20/2015 8.8  8.4 - 10.5 mg/dL Final  . GFR 12/20/2015 71.13  >60.00 mL/min Final  .  Cholesterol 12/20/2015 87  0 - 200 mg/dL Final  . Triglycerides 12/20/2015 174.0* 0.0 - 149.0 mg/dL Final  . HDL 12/20/2015 24.90* >39.00 mg/dL Final  . VLDL 12/20/2015 34.8  0.0 - 40.0 mg/dL Final  . LDL Cholesterol 12/20/2015 27  0 - 99 mg/dL Final  . Total CHOL/HDL Ratio 12/20/2015 3   Final  . NonHDL 12/20/2015 61.94   Final  . TSH 12/20/2015 1.22  0.35 - 4.50 uIU/mL Final  . Free T4 12/20/2015 0.87  0.60 - 1.60 ng/dL Final        Medication List       Accurate as of 12/25/15  2:42 PM. Always use your most recent med list.          amLODipine 5 MG tablet Commonly known as:  NORVASC Take 1 tablet (5 mg total) by mouth daily.   buPROPion 100 MG 12 hr tablet Commonly known as:  WELLBUTRIN SR Take 100 mg by mouth 2 (two) times daily.   citalopram 20 MG tablet Commonly known as:  CELEXA   cyclobenzaprine 10 MG tablet Commonly known as:  FLEXERIL Take 1 tablet (10 mg total) by mouth at bedtime.   EQ LORATADINE 10 MG tablet Generic drug:  loratadine TAKE ONE TABLET BY MOUTH ONCE DAILY.   insulin NPH-regular Human (70-30) 100 UNIT/ML injection Commonly known as:  NOVOLIN 70/30 RELION INJECT 50 UNITS UNDER THE SKIN 3 TIMES DAILY AS DIRECTED   INSULIN SYRINGE 1CC/31GX5/16" 31G X 5/16" 1 ML Misc Use 3 needles per day   lamoTRIgine 100 MG tablet Commonly known as:  LAMICTAL Take by mouth. 2 by mouth once a day   levothyroxine 50 MCG tablet Commonly known as:  SYNTHROID, LEVOTHROID TAKE ONE TABLET BY MOUTH ONCE DAILY   lithium 600 MG capsule Take 600 mg by mouth 3 (three) times daily. Abstracted from Dr Adegoroye's note patient is taking 2100 mg total   losartan 100 MG tablet Commonly known as:  COZAAR TAKE ONE TABLET BY MOUTH ONCE DAILY   lovastatin 20 MG tablet Commonly known as:  MEVACOR Take 1 tablet (20 mg total) by mouth at bedtime.   metFORMIN 1000 MG tablet Commonly known as:  GLUCOPHAGE Take 1 tablet (1,000 mg total) by mouth 2 (two) times daily  with a meal.   omeprazole 20 MG capsule Commonly known as:  PRILOSEC TAKE ONE CAPSULE BY MOUTH EVERY DAY  ONETOUCH VERIO test strip Generic drug:  glucose blood USE ONE STRIP TO CHECK BLOOD GLUCOSE TWICE DAILY AS DIRECTED   OXcarbazepine 150 MG tablet Commonly known as:  TRILEPTAL Take 150 mg by mouth 2 (two) times daily. Abstracted from Dr Adegoroye's note   sildenafil 20 MG tablet Commonly known as:  REVATIO Take 1 tablet (20 mg total) by mouth daily as needed (sexual activity).   tamsulosin 0.4 MG Caps capsule Commonly known as:  FLOMAX   V-GO 40 Kit Use one per day dx code E11.65   VICTOZA 18 MG/3ML Sopn Generic drug:  Liraglutide Inject 0.2 mLs (1.2 mg total) into the skin daily. Inject once daily at the same time       Allergies: No Known Allergies  Past Medical History:  Diagnosis Date  . Bipolar II disorder - Managed by Marye Round [phone]) 05/03/2013  . Diabetes mellitus   . Hyperlipidemia   . Hypertension   . Unspecified hypothyroidism 05/03/2013    Past Surgical History:  Procedure Laterality Date  . KNEE SURGERY      Family History  Problem Relation Age of Onset  . Diabetes Mother   . Hypertension Mother   . Heart disease Father   . Heart attack Father 2    Social History:  reports that he has never smoked. He does not have any smokeless tobacco history on file. He reports that he does not drink alcohol or use drugs.    Review of Systems   HYPERTENSION:  Currently taking losartan 100 mg and HCTZ, Started back only a week or so ago and blood pressure is high today Followed by PCP but he has been irregular with follow-up      LIPIDS: He has had marked increase in triglycerides previously. Currently only on 20 mg lovastatiWith improved control except low HDL       Lab Results  Component Value Date   CHOL 87 12/20/2015   HDL 24.90 (L) 12/20/2015   LDLCALC 27 12/20/2015   LDLDIRECT 43.0 07/24/2014   TRIG 174.0 (H) 12/20/2015    CHOLHDL 3 12/20/2015       Thyroid:  Has had mild hypothyroidism for a few years, TSH appears normal off Any thyroxine, previously on 50 g Also on lithium   Lab Results  Component Value Date   TSH 1.22 12/20/2015        Diabetic neuropathy present.   His foot exam  in 10/2014 showed the following sensory findings: Patchy decrease in sensation in the toes especially right and decreased on the plantar surface also   LABS:  Lab on 12/20/2015  Component Date Value Ref Range Status  . Hgb A1c MFr Bld 12/20/2015 7.7* 4.6 - 6.5 % Final  . Sodium 12/20/2015 141  135 - 145 mEq/L Final  . Potassium 12/20/2015 3.9  3.5 - 5.1 mEq/L Final  . Chloride 12/20/2015 107  96 - 112 mEq/L Final  . CO2 12/20/2015 31  19 - 32 mEq/L Final  . Glucose, Bld 12/20/2015 249* 70 - 99 mg/dL Final  . BUN 12/20/2015 17  6 - 23 mg/dL Final  . Creatinine, Ser 12/20/2015 1.12  0.40 - 1.50 mg/dL Final  . Calcium 12/20/2015 8.8  8.4 - 10.5 mg/dL Final  . GFR 12/20/2015 71.13  >60.00 mL/min Final  . Cholesterol 12/20/2015 87  0 - 200 mg/dL Final  . Triglycerides 12/20/2015 174.0* 0.0 - 149.0 mg/dL Final  . HDL 12/20/2015 24.90* >39.00 mg/dL Final  . VLDL 12/20/2015 34.8  0.0 - 40.0 mg/dL Final  . LDL Cholesterol 12/20/2015 27  0 - 99 mg/dL Final  . Total CHOL/HDL Ratio 12/20/2015 3   Final  . NonHDL 12/20/2015 61.94   Final  . TSH 12/20/2015 1.22  0.35 - 4.50 uIU/mL Final  . Free T4 12/20/2015 0.87  0.60 - 1.60 ng/dL Final    Physical Examination:  BP (!) 149/90   Pulse 100   Temp 98.2 F (36.8 C)   Resp 16   Ht _0  (1.803 m)   Wt (!) 300 lb 9.6 oz (136.4 kg)   SpO2 98%   BMI 41.93 kg/m        ASSESSMENT/PLAN:   Diabetes type 2, uncontrolled    See history of present illness for detailed discussion of his current management, blood sugar patterns and problems identified  His blood sugar control is very Poor because of his noncompliant with taking his insulin and not coming back for  follow-up as directed  However even with taking 50 units 3 times a day of the premixed insulin he is getting mostly high readings in the afternoons and evenings This may be partly related to inconsistent diet However he does not want to use Victoza because of cost He also refused the V-go pump despite potential advantages and improved compliance, he wants to continue insulin injections for now  He is not exercising aerobically and not losing weight  THYROID: Although he reportedly had hypothyroidism his TSH is still quite normal without any office 50 g supplement and he will continue to stay off of it for now   Recommendations:  Take insulin before eating rather than after, preferably 30 minutes.  Increase lunchtime dose to 60 units to help afternoon readings  Try to improve diet more  Reduce suppertime dose to 40 to avoid overnight hypoglycemia  Walking or other aerobic exercise  More readings after evening meal  Follow-up with PCP regarding blood pressure, overactive bladder type symptoms and other issues  Stay off levothyroxine and periodically check thyroid functions  May consider a V-go pump if having difficulty with control but he will need to use the U-500 insulin which may be expensive  Patient Instructions  Insulin just before eating: 50---60---40 units  Walking daily   Counseling time on subjects discussed above is over 50% of today's 25 minute visit   Bolivar Koranda 12/25/2015, 2:42 PM   Note: This office note was prepared with Dragon voice recognition system technology. Any transcriptional errors that result from this process are unintentional.

## 2015-12-27 DIAGNOSIS — R3915 Urgency of urination: Secondary | ICD-10-CM | POA: Diagnosis not present

## 2015-12-27 DIAGNOSIS — R350 Frequency of micturition: Secondary | ICD-10-CM | POA: Diagnosis not present

## 2016-01-02 ENCOUNTER — Telehealth: Payer: Self-pay | Admitting: Endocrinology

## 2016-01-02 NOTE — Telephone Encounter (Signed)
[facility] calling about pt's test strips the insurance is requesting a preferred meter so we need to call in all new meter and supplies to the [facility]   The options are Accucheck or Trumetrics  Or could do a PA for the current test strips one touch verio

## 2016-01-04 ENCOUNTER — Other Ambulatory Visit: Payer: Self-pay

## 2016-01-04 MED ORDER — ACCU-CHEK MULTICLIX LANCETS MISC: 12 refills | Status: DC

## 2016-01-04 MED ORDER — ACCU-CHEK AVIVA PLUS W/DEVICE KIT
1.0000 | PACK | Freq: Two times a day (BID) | 0 refills | Status: DC
Start: 2016-01-04 — End: 2016-07-28

## 2016-01-04 MED ORDER — GLUCOSE BLOOD VI STRP: ORAL_STRIP | 4 refills | Status: DC

## 2016-01-04 NOTE — Telephone Encounter (Signed)
An Rx for Accu-chek meter and strips were already sent to his pharmacy.

## 2016-01-09 DIAGNOSIS — F3162 Bipolar disorder, current episode mixed, moderate: Secondary | ICD-10-CM | POA: Diagnosis not present

## 2016-01-15 DIAGNOSIS — Z79899 Other long term (current) drug therapy: Secondary | ICD-10-CM | POA: Diagnosis not present

## 2016-01-15 DIAGNOSIS — M25561 Pain in right knee: Secondary | ICD-10-CM | POA: Diagnosis not present

## 2016-01-15 DIAGNOSIS — M1711 Unilateral primary osteoarthritis, right knee: Secondary | ICD-10-CM | POA: Diagnosis not present

## 2016-01-16 ENCOUNTER — Other Ambulatory Visit: Payer: Self-pay | Admitting: Family Medicine

## 2016-01-22 ENCOUNTER — Ambulatory Visit (INDEPENDENT_AMBULATORY_CARE_PROVIDER_SITE_OTHER): Payer: Commercial Managed Care - HMO | Admitting: Family Medicine

## 2016-01-22 ENCOUNTER — Encounter: Payer: Self-pay | Admitting: Family Medicine

## 2016-01-22 VITALS — BP 130/84 | HR 95 | Temp 98.0°F | Ht 71.0 in | Wt 298.0 lb

## 2016-01-22 DIAGNOSIS — M1711 Unilateral primary osteoarthritis, right knee: Secondary | ICD-10-CM | POA: Diagnosis not present

## 2016-01-22 DIAGNOSIS — K219 Gastro-esophageal reflux disease without esophagitis: Secondary | ICD-10-CM

## 2016-01-22 DIAGNOSIS — E785 Hyperlipidemia, unspecified: Secondary | ICD-10-CM | POA: Diagnosis not present

## 2016-01-22 DIAGNOSIS — Z6841 Body Mass Index (BMI) 40.0 and over, adult: Secondary | ICD-10-CM | POA: Diagnosis not present

## 2016-01-22 DIAGNOSIS — E1159 Type 2 diabetes mellitus with other circulatory complications: Secondary | ICD-10-CM | POA: Diagnosis not present

## 2016-01-22 DIAGNOSIS — I152 Hypertension secondary to endocrine disorders: Secondary | ICD-10-CM

## 2016-01-22 DIAGNOSIS — F3181 Bipolar II disorder: Secondary | ICD-10-CM

## 2016-01-22 DIAGNOSIS — E1142 Type 2 diabetes mellitus with diabetic polyneuropathy: Secondary | ICD-10-CM

## 2016-01-22 DIAGNOSIS — I10 Essential (primary) hypertension: Secondary | ICD-10-CM

## 2016-01-22 DIAGNOSIS — F331 Major depressive disorder, recurrent, moderate: Secondary | ICD-10-CM

## 2016-01-22 MED ORDER — LOSARTAN POTASSIUM 100 MG PO TABS: 100.0000 mg | ORAL_TABLET | Freq: Every day | ORAL | 3 refills | Status: DC

## 2016-01-22 MED ORDER — LOVASTATIN 20 MG PO TABS: 20.0000 mg | ORAL_TABLET | Freq: Every day | ORAL | 3 refills | Status: DC

## 2016-01-22 MED ORDER — AMLODIPINE BESYLATE 5 MG PO TABS: 5.0000 mg | ORAL_TABLET | Freq: Every day | ORAL | 3 refills | Status: DC

## 2016-01-22 MED ORDER — OMEPRAZOLE 20 MG PO CPDR: 20.0000 mg | DELAYED_RELEASE_CAPSULE | Freq: Every day | ORAL | 3 refills | Status: DC

## 2016-01-22 NOTE — Progress Notes (Signed)
Pre visit review using our clinic review tool, if applicable. No additional management support is needed unless otherwise documented below in the visit note. 

## 2016-01-22 NOTE — Patient Instructions (Addendum)
BEFORE YOU LEAVE: -follow up: schedule Medicare Wellness Visit in 3 months with Dr. Maudie Mercury  We sent refills for you.  Hang in there. Please consider counseling and exercise and follow up with your psychiatrist.  We recommend the following healthy lifestyle for LIFE: 1) Small portions.   Tip: eat off of a salad plate instead of a dinner plate.  Tip: It is ok to feel hungry after a meal - that likely means you ate an appropriate portion.  Tip: if you need more or a snack choose fruits, veggies and/or a handful of nuts or seeds.  2) Eat a healthy clean diet.  * Tip: Avoid (less then 1 serving per week): processed foods, sweets, sweetened drinks, white starches (rice, flour, bread, potatoes, pasta, etc), red meat, fast foods, butter  *Tip: CHOOSE instead   * 5-9 servings per day of fresh or frozen fruits and vegetables (but not corn, potatoes, bananas, canned or dried fruit)   *nuts and seeds, beans   *olives and olive oil   *small portions of lean meats such as fish and white chicken    *small portions of whole grains  3)Get at least 150 minutes of sweaty aerobic exercise per week.  4)Reduce stress - consider counseling, meditation and relaxation to balance other aspects of your life.

## 2016-01-22 NOTE — Progress Notes (Signed)
HPI:  Walk in for Refills:  HTN: -meds: amlodipine 5, losartan 1000 - needs refill -reports doing well without CP, SOb, swelling  GERD: -chronic -reports bad reflux if stops medication -gets his prilosec from [facility] and needs refill -denies: dysphagia, hernia  HLD/Obesity: -no regular exercise, diet not great -takes lovastatin and needs refill -denies leg cramps, statin intolerability -having difficulty with depression right now (seeing psych, no SI) and this has interfered with exercise  He sees the endocrinologist for his diabetes and thyroid disease. He reports that he is diabetic eye exam and that he sees his podiatrist for his diabetic foot exam. He has had labs recently and September. These were reviewed.  ROS: See pertinent positives and negatives per HPI.  Past Medical History:  Diagnosis Date  . Bipolar II disorder - Managed by Marye Round [phone]) 05/03/2013  . Diabetes mellitus   . Hyperlipidemia   . Hypertension   . Unspecified hypothyroidism 05/03/2013    Past Surgical History:  Procedure Laterality Date  . KNEE SURGERY      Family History  Problem Relation Age of Onset  . Diabetes Mother   . Hypertension Mother   . Heart disease Father   . Heart attack Father 36    Social History   Social History  . Marital status: Single    Spouse name: N/A  . Number of children: N/A  . Years of education: N/A   Social History Main Topics  . Smoking status: Never Smoker  . Smokeless tobacco: None  . Alcohol use No  . Drug use: No  . Sexual activity: No   Other Topics Concern  . None   Social History Narrative   Work or School: on disability for knee arthritis      Home Situation: lives alone      Spiritual Beliefs:Christian      Lifestyle:no regular exercising; diet not great              Current Outpatient Prescriptions:  .  amLODipine (NORVASC) 5 MG tablet, Take 1 tablet (5 mg total) by mouth daily., Disp: 90 tablet, Rfl:  3 .  Blood Glucose Monitoring Suppl (ACCU-CHEK AVIVA PLUS) w/Device KIT, 1 each by Does not apply route 2 (two) times daily. Dx code is E11.42, Disp: 1 kit, Rfl: 0 .  buPROPion (WELLBUTRIN SR) 100 MG 12 hr tablet, Take 100 mg by mouth 2 (two) times daily. , Disp: , Rfl:  .  citalopram (CELEXA) 20 MG tablet, , Disp: , Rfl:  .  cyclobenzaprine (FLEXERIL) 10 MG tablet, Take 1 tablet (10 mg total) by mouth at bedtime., Disp: 15 tablet, Rfl: 0 .  EQ LORATADINE 10 MG tablet, TAKE ONE TABLET BY MOUTH ONCE DAILY., Disp: 30 tablet, Rfl: 0 .  EQ LORATADINE 10 MG tablet, TAKE ONE TABLET BY MOUTH ONCE DAILY, Disp: 30 tablet, Rfl: 0 .  glucose blood (ACCU-CHEK AVIVA PLUS) test strip, Use as instructed, Disp: 100 each, Rfl: 4 .  Insulin Disposable Pump (V-GO 40) KIT, Use one per day dx code E11.65, Disp: 1 kit, Rfl: 2 .  insulin NPH-regular Human (NOVOLIN 70/30 RELION) (70-30) 100 UNIT/ML injection, INJECT 50 UNITS UNDER THE SKIN 3 TIMES DAILY AS DIRECTED, Disp: 40 mL, Rfl: 3 .  Insulin Syringe-Needle U-100 (INSULIN SYRINGE 1CC/31GX5/16") 31G X 5/16" 1 ML MISC, Use 3 needles per day, Disp: 100 each, Rfl: 3 .  lamoTRIgine (LAMICTAL) 100 MG tablet, Take by mouth. 2 by mouth once a day, Disp: ,  Rfl:  .  Lancets (ACCU-CHEK MULTICLIX) lancets, Use as instructed, Disp: 100 each, Rfl: 12 .  levothyroxine (SYNTHROID, LEVOTHROID) 50 MCG tablet, TAKE ONE TABLET BY MOUTH ONCE DAILY, Disp: 30 tablet, Rfl: 5 .  lithium 600 MG capsule, Take 600 mg by mouth 3 (three) times daily. Abstracted from Dr Adegoroye's note patient is taking 2100 mg total, Disp: , Rfl:  .  losartan (COZAAR) 100 MG tablet, Take 1 tablet (100 mg total) by mouth daily., Disp: 90 tablet, Rfl: 3 .  lovastatin (MEVACOR) 20 MG tablet, Take 1 tablet (20 mg total) by mouth at bedtime., Disp: 90 tablet, Rfl: 3 .  metFORMIN (GLUCOPHAGE) 1000 MG tablet, Take 1 tablet (1,000 mg total) by mouth 2 (two) times daily with a meal., Disp: 180 tablet, Rfl: 0 .  omeprazole  (PRILOSEC) 20 MG capsule, Take 1 capsule (20 mg total) by mouth daily., Disp: 90 capsule, Rfl: 3 .  OXcarbazepine (TRILEPTAL) 150 MG tablet, Take 150 mg by mouth 2 (two) times daily. Abstracted from Dr Adegoroye's note, Disp: , Rfl:  .  sildenafil (REVATIO) 20 MG tablet, Take 1 tablet (20 mg total) by mouth daily as needed (sexual activity)., Disp: 10 tablet, Rfl: 0 .  tamsulosin (FLOMAX) 0.4 MG CAPS capsule, , Disp: , Rfl:  .  VICTOZA 18 MG/3ML SOPN, Inject 0.2 mLs (1.2 mg total) into the skin daily. Inject once daily at the same time, Disp: 2 pen, Rfl: 3  EXAM:  Vitals:   01/22/16 1320  BP: 130/84  Pulse: 95  Temp: 98 F (36.7 C)    Body mass index is 41.56 kg/m.  GENERAL: vitals reviewed and listed above, alert, oriented, appears well hydrated and in no acute distress  HEENT: atraumatic, conjunttiva clear, no obvious abnormalities on inspection of external nose and ears  NECK: no obvious masses on inspection  LUNGS: clear to auscultation bilaterally, no wheezes, rales or rhonchi, good air movement  CV: HRRR, no peripheral edema  MS: moves all extremities without noticeable abnormality  PSYCH: pleasant and cooperative, no obvious depression or anxiety  ASSESSMENT AND PLAN:  Discussed the following assessment and plan:  Hypertension associated with diabetes (HCC)  Hyperlipidemia, unspecified hyperlipidemia type  Gastroesophageal reflux disease, esophagitis presence not specified  BMI 40.0-44.9, adult (HCC)  DM type 2 with diabetic peripheral neuropathy (HCC)  Bipolar II disorder - Managed by Marye Round [phone])  Moderate episode of recurrent major depressive disorder ([place])  -supported and counseled - advised continued psychiatry management of his depression and consideration of CBT and regular exercise and healthy diet -medications refilled -medicare exam in 3 months -Patient advised to return or notify a doctor immediately if symptoms worsen or  persist or new concerns arise.  Patient Instructions  BEFORE YOU LEAVE: -follow up: schedule Medicare Wellness Visit in 3 months with Dr. Maudie Mercury  We sent refills for you.  Hang in there. Please consider counseling and exercise and follow up with your psychiatrist.  We recommend the following healthy lifestyle for LIFE: 1) Small portions.   Tip: eat off of a salad plate instead of a dinner plate.  Tip: It is ok to feel hungry after a meal - that likely means you ate an appropriate portion.  Tip: if you need more or a snack choose fruits, veggies and/or a handful of nuts or seeds.  2) Eat a healthy clean diet.  * Tip: Avoid (less then 1 serving per week): processed foods, sweets, sweetened drinks, white starches (rice, flour, bread, potatoes, pasta,  etc), red meat, fast foods, butter  *Tip: CHOOSE instead   * 5-9 servings per day of fresh or frozen fruits and vegetables (but not corn, potatoes, bananas, canned or dried fruit)   *nuts and seeds, beans   *olives and olive oil   *small portions of lean meats such as fish and white chicken    *small portions of whole grains  3)Get at least 150 minutes of sweaty aerobic exercise per week.  4)Reduce stress - consider counseling, meditation and relaxation to balance other aspects of your life.     Colin Benton R., DO

## 2016-01-23 DIAGNOSIS — F3162 Bipolar disorder, current episode mixed, moderate: Secondary | ICD-10-CM | POA: Diagnosis not present

## 2016-01-24 ENCOUNTER — Telehealth: Payer: Self-pay | Admitting: Family Medicine

## 2016-01-24 NOTE — Telephone Encounter (Signed)
l called [facility] and spoke with Glendell Docker and he stated they did receive the Rx that was sent on 10/24 for #90 with 3 refills and they will mail this to the pt.  Glendell Docker stated he will call the pt and let him know.

## 2016-01-24 NOTE — Telephone Encounter (Signed)
Pt need new Rx for omeprazole  Pharm:  [facility] in [place]

## 2016-01-29 DIAGNOSIS — M1711 Unilateral primary osteoarthritis, right knee: Secondary | ICD-10-CM | POA: Diagnosis not present

## 2016-01-31 DIAGNOSIS — F3162 Bipolar disorder, current episode mixed, moderate: Secondary | ICD-10-CM | POA: Diagnosis not present

## 2016-02-25 ENCOUNTER — Other Ambulatory Visit: Payer: Self-pay | Admitting: Family Medicine

## 2016-04-01 ENCOUNTER — Other Ambulatory Visit (INDEPENDENT_AMBULATORY_CARE_PROVIDER_SITE_OTHER): Payer: Medicare HMO

## 2016-04-01 DIAGNOSIS — Z794 Long term (current) use of insulin: Secondary | ICD-10-CM | POA: Diagnosis not present

## 2016-04-01 DIAGNOSIS — E1165 Type 2 diabetes mellitus with hyperglycemia: Secondary | ICD-10-CM

## 2016-04-01 LAB — COMPREHENSIVE METABOLIC PANEL
ALT: 24 U/L (ref 0–53)
AST: 19 U/L (ref 0–37)
Albumin: 4 g/dL (ref 3.5–5.2)
Alkaline Phosphatase: 55 U/L (ref 39–117)
BUN: 14 mg/dL (ref 6–23)
CHLORIDE: 101 meq/L (ref 96–112)
CO2: 29 meq/L (ref 19–32)
CREATININE: 1.13 mg/dL (ref 0.40–1.50)
Calcium: 9 mg/dL (ref 8.4–10.5)
GFR: 70.34 mL/min (ref >60.00)
Glucose, Bld: 328 mg/dL — ABNORMAL HIGH (ref 70–99)
Potassium: 4.1 mEq/L (ref 3.5–5.1)
SODIUM: 135 meq/L (ref 135–145)
Total Bilirubin: 0.4 mg/dL (ref 0.2–1.2)
Total Protein: 6.4 g/dL (ref 6.0–8.3)

## 2016-04-01 LAB — TSH: TSH: 0.89 u[IU]/mL (ref 0.35–4.50)

## 2016-04-01 LAB — HEMOGLOBIN A1C: Hgb A1c MFr Bld: 8.2 % — ABNORMAL HIGH (ref 4.6–6.5)

## 2016-04-02 DIAGNOSIS — R69 Illness, unspecified: Secondary | ICD-10-CM | POA: Diagnosis not present

## 2016-04-04 ENCOUNTER — Encounter: Payer: Self-pay | Admitting: Endocrinology

## 2016-04-04 ENCOUNTER — Ambulatory Visit (INDEPENDENT_AMBULATORY_CARE_PROVIDER_SITE_OTHER): Payer: Medicare HMO | Admitting: Endocrinology

## 2016-04-04 ENCOUNTER — Other Ambulatory Visit: Payer: Self-pay | Admitting: *Deleted

## 2016-04-04 VITALS — BP 142/80 | HR 76 | Ht 72.44 in | Wt 294.0 lb

## 2016-04-04 DIAGNOSIS — E1165 Type 2 diabetes mellitus with hyperglycemia: Secondary | ICD-10-CM

## 2016-04-04 DIAGNOSIS — I10 Essential (primary) hypertension: Secondary | ICD-10-CM

## 2016-04-04 DIAGNOSIS — Z794 Long term (current) use of insulin: Secondary | ICD-10-CM

## 2016-04-04 LAB — URINALYSIS, ROUTINE W REFLEX MICROSCOPIC
BILIRUBIN URINE: NEGATIVE
HGB URINE DIPSTICK: NEGATIVE
Ketones, ur: NEGATIVE
LEUKOCYTES UA: NEGATIVE
NITRITE: NEGATIVE
Specific Gravity, Urine: 1.02 (ref 1.000–1.030)
TOTAL PROTEIN, URINE-UPE24: NEGATIVE
URINE GLUCOSE: 250 — AB
UROBILINOGEN UA: 0.2 (ref 0.0–1.0)
pH: 6 (ref 5.0–8.0)

## 2016-04-04 LAB — MICROALBUMIN / CREATININE URINE RATIO
CREATININE, U: 110 mg/dL
MICROALB/CREAT RATIO: 3.8 mg/g (ref 0.0–30.0)
Microalb, Ur: 4.2 mg/dL — ABNORMAL HIGH (ref 0.0–1.9)

## 2016-04-04 MED ORDER — CANAGLIFLOZIN 100 MG PO TABS: ORAL_TABLET | ORAL | 3 refills | Status: DC

## 2016-04-04 NOTE — Patient Instructions (Signed)
Take insulin at least 2x daily, even if not eating  Check blood sugars on waking up  3x weekly  Also check blood sugars about 2 hours after a meal and do this after different meals by rotation  Recommended blood sugar levels on waking up is 90-130 and about 2 hours after meal is 130-160  Please bring your blood sugar monitor to each visit, thank you  Reduce Amlodipine to 1/2

## 2016-04-04 NOTE — Progress Notes (Signed)
Patient ID: Matthew Barnes, male   DOB: 09-Feb-1956, 61 y.o.   MRN: [id number]    Reason for Appointment: Followup for Type 2 Diabetes  Referring physician:  Maudie Mercury  History of Present Illness:          Diagnosis: Type 2 diabetes mellitus, date of diagnosis:   2011       Past history:  He was started on metformin at diagnosis and apparently did have fairly good control initially However about a year later because of poor control he was given insulin in addition. He had been taking Lantus insulin until about 2/15, up to 80 units a day However he does not think his blood sugars  Were controlled with this Because of higher A1c of 13.1% he was referred here for diabetes management in 6/15; was started on glucose monitoring and insulin was changed from low dose Levemir to Humalog mix 70 units a day He was also started on Victoza  to help with blood sugar control and facilitate weight loss on his initial consultation  Recent history:   INSULIN regimen is described as: Novolin mix 70/30, 50 units ac qd-tid using syringe   A1c is much higher at 8.2 and previously has low as 6.2 He says that his blood sugars are poorly controlled because of issues with his bipolar illness  He has previously gone off Victoza because of cost    Current blood sugar patterns, management of diabetes and problems:   He is checking blood sugar very rarely and mostly in the evenings  He says he is not sleeping at night and his eating patterns are very irregular  Also because of his depression he is not motivated to take his insulin and is taking this mostly once a day  His morning glucose in the lab was over 300  Most of his readings in the evenings are over 200 with a couple of slightly better readings early morning  He thinks he takes his metformin more regularly  Still not motivated to exercise   His dietary compliance is limited by his needing to eat out frequently and also stress eating with  carbohydrates   Oral hypoglycemic drugs the patient is taking are: Metformin 1 g twice a day      Side effects from medications have been: None  Glucose monitoring:  done less than 1 times a day         Glucometer:  Accu-Chek  Blood Glucose readings from review of monitor as below, checking mostly in the morning, has only 8 readings in the last month  Mean values apply above for all meters except median for One Touch  PRE-MEAL Fasting Lunch Dinner Bedtime Overall  Glucose range: 182    172-327    Mean/median:        Glycemic control:   Lab Results  Component Value Date   HGBA1C 8.2 (H) 04/01/2016   HGBA1C 7.7 (H) 12/20/2015   HGBA1C 6.2 06/12/2015   Lab Results  Component Value Date   MICROALBUR 4.2 (H) 04/04/2016   LDLCALC 27 12/20/2015   CREATININE 1.13 04/01/2016    Self-care: The diet that the patient has been following is: None, unable to control portions and carbs  when depressed  Meals: 3 meals per day (dinner 6 pm-10 pm)          Exercise: not walking Recently        Dietician visit: Most recent: never      CDE visit: 08/2013  Weight history: baseline 295  Wt Readings from Last 3 Encounters:  04/04/16 294 lb (133.4 kg)  01/22/16 298 lb (135.2 kg)  12/25/15 (!) 300 lb 9.6 oz (136.4 kg)   Lab on 04/01/2016  Component Date Value Ref Range Status  . Hgb A1c MFr Bld 04/01/2016 8.2* 4.6 - 6.5 % Final  . Sodium 04/01/2016 135  135 - 145 mEq/L Final  . Potassium 04/01/2016 4.1  3.5 - 5.1 mEq/L Final  . Chloride 04/01/2016 101  96 - 112 mEq/L Final  . CO2 04/01/2016 29  19 - 32 mEq/L Final  . Glucose, Bld 04/01/2016 328* 70 - 99 mg/dL Final  . BUN 04/01/2016 14  6 - 23 mg/dL Final  . Creatinine, Ser 04/01/2016 1.13  0.40 - 1.50 mg/dL Final  . Total Bilirubin 04/01/2016 0.4  0.2 - 1.2 mg/dL Final  . Alkaline Phosphatase 04/01/2016 55  39 - 117 U/L Final  . AST 04/01/2016 19  0 - 37 U/L Final  . ALT 04/01/2016 24  0 - 53 U/L Final  . Total Protein  04/01/2016 6.4  6.0 - 8.3 g/dL Final  . Albumin 04/01/2016 4.0  3.5 - 5.2 g/dL Final  . Calcium 04/01/2016 9.0  8.4 - 10.5 mg/dL Final  . GFR 04/01/2016 70.34  >60.00 mL/min Final  . TSH 04/01/2016 0.89  0.35 - 4.50 uIU/mL Final  . Microalb, Ur 04/04/2016 4.2* 0.0 - 1.9 mg/dL Final  . Creatinine,U 04/04/2016 110.0  mg/dL Final  . Microalb Creat Ratio 04/04/2016 3.8  0.0 - 30.0 mg/g Final  . Color, Urine 04/04/2016 YELLOW  Yellow;Lt. Yellow Final  . APPearance 04/04/2016 CLEAR  Clear Final  . Specific Gravity, Urine 04/04/2016 1.020  1.000 - 1.030 Final  . pH 04/04/2016 6.0  5.0 - 8.0 Final  . Total Protein, Urine 04/04/2016 NEGATIVE  Negative Final  . Urine Glucose 04/04/2016 250* Negative Final  . Ketones, ur 04/04/2016 NEGATIVE  Negative Final  . Bilirubin Urine 04/04/2016 NEGATIVE  Negative Final  . Hgb urine dipstick 04/04/2016 NEGATIVE  Negative Final  . Urobilinogen, UA 04/04/2016 0.2  0.0 - 1.0 Final  . Leukocytes, UA 04/04/2016 NEGATIVE  Negative Final  . Nitrite 04/04/2016 NEGATIVE  Negative Final  . WBC, UA 04/04/2016 0-2/hpf  0-2/hpf Final  . RBC / HPF 04/04/2016 0-2/hpf  0-2/hpf Final      Allergies as of 04/04/2016   No Known Allergies     Medication List       Accurate as of 04/04/16  1:31 PM. Always use your most recent med list.          ACCU-CHEK AVIVA PLUS w/Device Kit 1 each by Does not apply route 2 (two) times daily. Dx code is E11.42   accu-chek multiclix lancets Use as instructed   amLODipine 5 MG tablet Commonly known as:  NORVASC Take 1 tablet (5 mg total) by mouth daily.   canagliflozin 100 MG Tabs tablet Commonly known as:  INVOKANA 1 tablet before breakfast   EQ LORATADINE 10 MG tablet Generic drug:  loratadine TAKE ONE TABLET BY MOUTH ONCE DAILY.   glucose blood test strip Commonly known as:  ACCU-CHEK AVIVA PLUS Use as instructed   insulin NPH-regular Human (70-30) 100 UNIT/ML injection Commonly known as:  NOVOLIN 70/30  RELION INJECT 50 UNITS UNDER THE SKIN 3 TIMES DAILY AS DIRECTED   INSULIN SYRINGE 1CC/31GX5/16" 31G X 5/16" 1 ML Misc Use 3 needles per day   lithium 600 MG capsule Take 600  mg by mouth 3 (three) times daily. Abstracted from Dr Adegoroye's note patient is taking 2100 mg total   losartan 100 MG tablet Commonly known as:  COZAAR Take 1 tablet (100 mg total) by mouth daily.   lovastatin 20 MG tablet Commonly known as:  MEVACOR Take 1 tablet (20 mg total) by mouth at bedtime.   metFORMIN 1000 MG tablet Commonly known as:  GLUCOPHAGE Take 1 tablet (1,000 mg total) by mouth 2 (two) times daily with a meal.   omeprazole 20 MG capsule Commonly known as:  PRILOSEC Take 1 capsule (20 mg total) by mouth daily.       Allergies: No Known Allergies  Past Medical History:  Diagnosis Date  . Bipolar II disorder - Managed by Marye Round [phone]) 05/03/2013  . Diabetes mellitus   . Hyperlipidemia   . Hypertension   . Unspecified hypothyroidism 05/03/2013    Past Surgical History:  Procedure Laterality Date  . KNEE SURGERY      Family History  Problem Relation Age of Onset  . Diabetes Mother   . Hypertension Mother   . Heart disease Father   . Heart attack Father 74    Social History:  reports that he has never smoked. He does not have any smokeless tobacco history on file. He reports that he does not drink alcohol or use drugs.    Review of Systems   HYPERTENSION:  Currently taking losartan 100 mg and Norvasc; HCTZ was probably stopped by his PCP      LIPIDS: He has had marked increase in triglycerides previously. Currently only on 20 mg lovastatiWith improved control except low HDL       Lab Results  Component Value Date   CHOL 87 12/20/2015   HDL 24.90 (L) 12/20/2015   LDLCALC 27 12/20/2015   LDLDIRECT 43.0 07/24/2014   TRIG 174.0 (H) 12/20/2015   CHOLHDL 3 12/20/2015       Thyroid:  Has had mild hypothyroidism for a few years, Possibly when he was  taking lithium TSH appears normal consistently now without taking any levothyroxine He has not been on lithium lately    Lab Results  Component Value Date   TSH 0.89 04/01/2016        Diabetic neuropathy present.   His foot exam  in 10/2014 showed the following sensory findings: Patchy decrease in sensation in the toes especially right and decreased on the plantar surface also He is also followed by a podiatrist   LABS:  Lab on 04/01/2016  Component Date Value Ref Range Status  . Hgb A1c MFr Bld 04/01/2016 8.2* 4.6 - 6.5 % Final  . Sodium 04/01/2016 135  135 - 145 mEq/L Final  . Potassium 04/01/2016 4.1  3.5 - 5.1 mEq/L Final  . Chloride 04/01/2016 101  96 - 112 mEq/L Final  . CO2 04/01/2016 29  19 - 32 mEq/L Final  . Glucose, Bld 04/01/2016 328* 70 - 99 mg/dL Final  . BUN 04/01/2016 14  6 - 23 mg/dL Final  . Creatinine, Ser 04/01/2016 1.13  0.40 - 1.50 mg/dL Final  . Total Bilirubin 04/01/2016 0.4  0.2 - 1.2 mg/dL Final  . Alkaline Phosphatase 04/01/2016 55  39 - 117 U/L Final  . AST 04/01/2016 19  0 - 37 U/L Final  . ALT 04/01/2016 24  0 - 53 U/L Final  . Total Protein 04/01/2016 6.4  6.0 - 8.3 g/dL Final  . Albumin 04/01/2016 4.0  3.5 - 5.2 g/dL Final  .  Calcium 04/01/2016 9.0  8.4 - 10.5 mg/dL Final  . GFR 04/01/2016 70.34  >60.00 mL/min Final  . TSH 04/01/2016 0.89  0.35 - 4.50 uIU/mL Final  . Microalb, Ur 04/04/2016 4.2* 0.0 - 1.9 mg/dL Final  . Creatinine,U 04/04/2016 110.0  mg/dL Final  . Microalb Creat Ratio 04/04/2016 3.8  0.0 - 30.0 mg/g Final  . Color, Urine 04/04/2016 YELLOW  Yellow;Lt. Yellow Final  . APPearance 04/04/2016 CLEAR  Clear Final  . Specific Gravity, Urine 04/04/2016 1.020  1.000 - 1.030 Final  . pH 04/04/2016 6.0  5.0 - 8.0 Final  . Total Protein, Urine 04/04/2016 NEGATIVE  Negative Final  . Urine Glucose 04/04/2016 250* Negative Final  . Ketones, ur 04/04/2016 NEGATIVE  Negative Final  . Bilirubin Urine 04/04/2016 NEGATIVE  Negative Final  .  Hgb urine dipstick 04/04/2016 NEGATIVE  Negative Final  . Urobilinogen, UA 04/04/2016 0.2  0.0 - 1.0 Final  . Leukocytes, UA 04/04/2016 NEGATIVE  Negative Final  . Nitrite 04/04/2016 NEGATIVE  Negative Final  . WBC, UA 04/04/2016 0-2/hpf  0-2/hpf Final  . RBC / HPF 04/04/2016 0-2/hpf  0-2/hpf Final    Physical Examination:  BP (!) 142/80   Pulse 76   Ht 6' 0.44" (1.84 m)   Wt 294 lb (133.4 kg)   SpO2 97%   BMI 39.39 kg/m        ASSESSMENT/PLAN:   Diabetes type 2, uncontrolled    See history of present illness for detailed discussion of his current management, blood sugar patterns and problems identified  His blood sugar control is Consistently poor because of noncompliance with all measures of self-care. He is not able to follow instructions for diet or insulin because of issues with depression Also has difficulty affording various medications including Victoza in the past and still wants to continue taking [employer] brand premixed insulin He is unlikely to be getting enough basal insulin because of taking only one injection a day, does not understand the need to take his insulin at least twice a day regardless of whether he is eating 3 meals and not Again has difficulty losing weight Not clear if he is benefiting much from metformin  THYROID:   TSH is still quite normal without any office 50 g supplement and he will continue to stay off of it  Hypertension: Systolic is high normal today   Recommendations: Discussed action of SGLT 2 drugs on lowering glucose by decreasing kidney absorption of glucose, benefits of weight loss and lower blood pressure, possible side effects including candidiasis and dosage regimen   He is a good candidate for medication like Invokana and discussed studies indicating long-term benefit and discussed that they should be safe for him.  He can start with 100 mg once a day  While starting this he will cut his amlodipine and half  He will need to  take at least half of his insulin dose when he is not eating to get his basal insulin requirement, not clear if he will require less insulin with starting Invokana  Check blood sugars much more frequently and at various times  Regular exercise  Consistent diet  Follow-up in 4 weeks to reassess his progress and consider increasing Invokana and possibly combining this with metformin  Patient Instructions  Take insulin at least 2x daily, even if not eating  Check blood sugars on waking up  3x weekly  Also check blood sugars about 2 hours after a meal and do this after different meals by  rotation  Recommended blood sugar levels on waking up is 90-130 and about 2 hours after meal is 130-160  Please bring your blood sugar monitor to each visit, thank you  Reduce Amlodipine to 1/2   Counseling time on subjects discussed above is over 50% of today's 25 minute visit   Matthew Barnes 04/04/2016, 1:31 PM   Note: This office note was prepared with Estate agent. Any transcriptional errors that result from this process are unintentional.

## 2016-04-08 ENCOUNTER — Telehealth: Payer: Self-pay | Admitting: Endocrinology

## 2016-04-08 NOTE — Telephone Encounter (Signed)
The patient called to say that this medication: canagliflozin (INVOKANA) 100 MG TABS tablet  Makes him go to the patient all day and he needs something different.

## 2016-04-11 NOTE — Telephone Encounter (Signed)
Change to Jardiance 10mg  in am daily

## 2016-04-14 ENCOUNTER — Other Ambulatory Visit: Payer: Self-pay

## 2016-04-14 MED ORDER — EMPAGLIFLOZIN 10 MG PO TABS: 10.0000 mg | ORAL_TABLET | Freq: Every day | ORAL | 3 refills | Status: DC

## 2016-04-14 NOTE — Telephone Encounter (Signed)
Ordered 04/14/16

## 2016-04-15 ENCOUNTER — Telehealth: Payer: Self-pay | Admitting: Endocrinology

## 2016-04-15 NOTE — Telephone Encounter (Signed)
Pt called and requests to speak to you, he did not specify why.

## 2016-04-18 ENCOUNTER — Other Ambulatory Visit: Payer: Self-pay

## 2016-04-18 MED ORDER — GLUCOSE BLOOD VI STRP: ORAL_STRIP | 4 refills | Status: DC

## 2016-04-18 NOTE — Telephone Encounter (Signed)
Need refill of glucose blood (ACCU-CHEK AVIVA PLUS) test strip  [facility] [address], [place] [phone] (Phone) [phone] (Fax)

## 2016-04-18 NOTE — Telephone Encounter (Signed)
Patient asked you to give him a call.

## 2016-04-18 NOTE — Telephone Encounter (Signed)
Ordered 04/18/16 

## 2016-04-21 NOTE — Telephone Encounter (Signed)
Pt needs the test strips [facility] into [facility] [place] now please

## 2016-04-23 ENCOUNTER — Other Ambulatory Visit: Payer: Self-pay

## 2016-04-23 MED ORDER — GLUCOSE BLOOD VI STRP: ORAL_STRIP | 4 refills | Status: DC

## 2016-04-23 NOTE — Telephone Encounter (Signed)
Ordered 04/23/16

## 2016-04-23 NOTE — Telephone Encounter (Signed)
Patient ask if you could call in his test strips into  [facility] [place], [place] [phone] (Phone) [phone] (Fax)

## 2016-04-28 ENCOUNTER — Telehealth: Payer: Self-pay | Admitting: Endocrinology

## 2016-04-28 NOTE — Telephone Encounter (Signed)
Unclear what that this is from, may not be from diabetes if it is recent in onset.  Would recommend that he ask his PCP first to be seen.  If it is diabetes related would only advise better diabetes control

## 2016-04-28 NOTE — Telephone Encounter (Signed)
Pts right foot is very numb and part of the left is getting numb so he is wanting to be seen sooner  Can you work him in anywhere?

## 2016-04-29 ENCOUNTER — Other Ambulatory Visit: Payer: Medicare HMO

## 2016-04-29 ENCOUNTER — Ambulatory Visit (INDEPENDENT_AMBULATORY_CARE_PROVIDER_SITE_OTHER): Payer: Medicare HMO | Admitting: Family Medicine

## 2016-04-29 ENCOUNTER — Telehealth: Payer: Self-pay | Admitting: Family Medicine

## 2016-04-29 ENCOUNTER — Encounter: Payer: Self-pay | Admitting: Family Medicine

## 2016-04-29 VITALS — BP 138/84 | HR 92 | Temp 97.5°F | Ht 72.44 in | Wt 301.7 lb

## 2016-04-29 DIAGNOSIS — E1142 Type 2 diabetes mellitus with diabetic polyneuropathy: Secondary | ICD-10-CM

## 2016-04-29 DIAGNOSIS — I10 Essential (primary) hypertension: Secondary | ICD-10-CM

## 2016-04-29 DIAGNOSIS — R202 Paresthesia of skin: Secondary | ICD-10-CM

## 2016-04-29 DIAGNOSIS — E1159 Type 2 diabetes mellitus with other circulatory complications: Secondary | ICD-10-CM | POA: Diagnosis not present

## 2016-04-29 DIAGNOSIS — F3160 Bipolar disorder, current episode mixed, unspecified: Secondary | ICD-10-CM

## 2016-04-29 DIAGNOSIS — R69 Illness, unspecified: Secondary | ICD-10-CM | POA: Diagnosis not present

## 2016-04-29 DIAGNOSIS — I152 Hypertension secondary to endocrine disorders: Secondary | ICD-10-CM

## 2016-04-29 LAB — CBC
HEMATOCRIT: 40.4 % (ref 39.0–52.0)
HEMOGLOBIN: 13.7 g/dL (ref 13.0–17.0)
MCHC: 33.9 g/dL (ref 30.0–36.0)
MCV: 76.1 fl — AB (ref 78.0–100.0)
Platelets: 150 10*3/uL (ref 150.0–400.0)
RBC: 5.31 Mil/uL (ref 4.22–5.81)
RDW: 14 % (ref 11.5–15.5)
WBC: 6.6 10*3/uL (ref 4.0–10.5)

## 2016-04-29 LAB — VITAMIN B12: Vitamin B-12: 459 pg/mL (ref 211–911)

## 2016-04-29 MED ORDER — GLUCOSE BLOOD VI STRP: ORAL_STRIP | 1 refills | Status: DC

## 2016-04-29 NOTE — Patient Instructions (Addendum)
BEFORE YOU LEAVE: -follow up: 3 months  -labs -lancets and test strips  Ensure good control of your diabetes.  Schedule appointment with your podiatrist about your foot issues.  Schedule psychiatry appointment.  Follow up sooner if worsening or new concerns.

## 2016-04-29 NOTE — Progress Notes (Signed)
HPI:  Matthew Barnes is a pleasant 61 yo with a PMH bipolar disorder, DM with peripheral neuropathy, HTN, HLD, hypothyroidism and obesity here for an acute visit for foot "numbness."  Reports intermittent, mild discomfort or "numbness" in bilat medial feet for some time. Reports occurs when driving mainly. No weakness, trauma, pain, fevers, bowel or bladder incontinence, known back issues.  Of note reports he stopped seeing his psychiatrist and stopped taking his psych meds. Admits to mild manic symptoms, increased anxiety and poor sleep, occ tingling in all fingers. Reports getting counseling and set up to see another psychiatrist - he is unsure of name today.  ROS: See pertinent positives and negatives per HPI.  Past Medical History:  Diagnosis Date  . Bipolar II disorder - Managed by Marye Round [phone]) 05/03/2013  . Diabetes mellitus   . Hyperlipidemia   . Hypertension   . Unspecified hypothyroidism 05/03/2013    Past Surgical History:  Procedure Laterality Date  . KNEE SURGERY      Family History  Problem Relation Age of Onset  . Diabetes Mother   . Hypertension Mother   . Heart disease Father   . Heart attack Father 105    Social History   Social History  . Marital status: Single    Spouse name: N/A  . Number of children: N/A  . Years of education: N/A   Social History Main Topics  . Smoking status: Never Smoker  . Smokeless tobacco: Never Used  . Alcohol use No  . Drug use: No  . Sexual activity: No   Other Topics Concern  . None   Social History Narrative   Work or School: on disability for knee arthritis      Home Situation: lives alone      Spiritual Beliefs:Christian      Lifestyle:no regular exercising; diet not great              Current Outpatient Prescriptions:  .  amLODipine (NORVASC) 5 MG tablet, Take 1 tablet (5 mg total) by mouth daily., Disp: 90 tablet, Rfl: 3 .  Blood Glucose Monitoring Suppl (ACCU-CHEK AVIVA PLUS) w/Device  KIT, 1 each by Does not apply route 2 (two) times daily. Dx code is E11.42, Disp: 1 kit, Rfl: 0 .  canagliflozin (INVOKANA) 100 MG TABS tablet, 1 tablet before breakfast, Disp: 30 tablet, Rfl: 3 .  empagliflozin (JARDIANCE) 10 MG TABS tablet, Take 10 mg by mouth daily., Disp: 30 tablet, Rfl: 3 .  EQ LORATADINE 10 MG tablet, TAKE ONE TABLET BY MOUTH ONCE DAILY., Disp: 30 tablet, Rfl: 0 .  glucose blood (ACCU-CHEK AVIVA PLUS) test strip, Use to test blood sugar 2 times daily, Disp: 100 each, Rfl: 4 .  insulin NPH-regular Human (NOVOLIN 70/30 RELION) (70-30) 100 UNIT/ML injection, INJECT 50 UNITS UNDER THE SKIN 3 TIMES DAILY AS DIRECTED, Disp: 40 mL, Rfl: 3 .  Insulin Syringe-Needle U-100 (INSULIN SYRINGE 1CC/31GX5/16") 31G X 5/16" 1 ML MISC, Use 3 needles per day, Disp: 100 each, Rfl: 3 .  Lancets (ACCU-CHEK MULTICLIX) lancets, Use as instructed, Disp: 100 each, Rfl: 12 .  losartan (COZAAR) 100 MG tablet, Take 1 tablet (100 mg total) by mouth daily., Disp: 90 tablet, Rfl: 3 .  lovastatin (MEVACOR) 20 MG tablet, Take 1 tablet (20 mg total) by mouth at bedtime., Disp: 90 tablet, Rfl: 3 .  metFORMIN (GLUCOPHAGE) 1000 MG tablet, Take 1 tablet (1,000 mg total) by mouth 2 (two) times daily with a meal., Disp: 180  tablet, Rfl: 0 .  omeprazole (PRILOSEC) 20 MG capsule, Take 1 capsule (20 mg total) by mouth daily., Disp: 90 capsule, Rfl: 3  EXAM:  Vitals:   04/29/16 1352  BP: 138/84  Pulse: 92  Temp: 97.5 F (36.4 C)    Body mass index is 40.42 kg/m.  GENERAL: vitals reviewed and listed above, alert, oriented, appears well hydrated and in no acute distress  HEENT: atraumatic, conjunttiva clear, no obvious abnormalities on inspection of external nose and ears  NECK: no obvious masses on inspection  LUNGS: clear to auscultation bilaterally, no wheezes, rales or rhonchi, good air movement  CV: HRRR, no peripheral edema  MS: moves all extremities without noticeable abnormality, normal  inspections of feet and lower legs except for mild edema/mail varicosities, minor callus formation feet. Normal strength in feet and ankles. Normal sensitivity to light touch throughout in bilat feet, mild scattered reduction monofilament sensitivity bilat.  PSYCH: pleasant and cooperative, no obvious depression or anxiety  ASSESSMENT AND PLAN:  Discussed the following assessment and plan:  DM type 2 with diabetic peripheral neuropathy (HCC) Paresthesia of both feet -his symptoms seem mild and will have him see his podiatrist to see if foot wear will help - he agreed to set this up, discussed trial gabapentin if persists -will check cbc, b12 not done recently -consider NCS/neurology eval if persistent or worsening but likely related to his diabetes -advised daily foot checks and good control diabetes -refill test strips/lancets per his request  Bipolar affective, mixed ([place]) -advised psych follow up  Hypertension associated with diabetes ([place]) -reviewed recent labs, lifestyle recs -cont current meds, follow up 3 months  -Patient advised to return or notify a doctor immediately if symptoms worsen or persist or new concerns arise.  Patient Instructions  BEFORE YOU LEAVE: -follow up: 3 months  -labs -lancets and test strips  Ensure good control of your diabetes.  Schedule appointment with your podiatrist about your foot issues.  Schedule psychiatry appointment.  Follow up sooner if worsening or new concerns.    Colin Benton R., DO

## 2016-04-29 NOTE — Telephone Encounter (Signed)
I called the pt and scheduled an appt for today at 2pm. 

## 2016-04-29 NOTE — Telephone Encounter (Signed)
See if he can come in for appt today to eval. Thanks.

## 2016-04-29 NOTE — Progress Notes (Signed)
Pre visit review using our clinic review tool, if applicable. No additional management support is needed unless otherwise documented below in the visit note. 

## 2016-04-29 NOTE — Addendum Note (Signed)
Addended by: Agnes Lawrence on: 04/29/2016 03:35 PM   Modules accepted: Orders

## 2016-04-30 ENCOUNTER — Other Ambulatory Visit: Payer: Medicare HMO

## 2016-04-30 DIAGNOSIS — E1165 Type 2 diabetes mellitus with hyperglycemia: Secondary | ICD-10-CM

## 2016-04-30 DIAGNOSIS — E1169 Type 2 diabetes mellitus with other specified complication: Principal | ICD-10-CM

## 2016-04-30 DIAGNOSIS — IMO0002 Reserved for concepts with insufficient information to code with codable children: Secondary | ICD-10-CM

## 2016-04-30 DIAGNOSIS — Z794 Long term (current) use of insulin: Secondary | ICD-10-CM

## 2016-05-01 DIAGNOSIS — R69 Illness, unspecified: Secondary | ICD-10-CM | POA: Diagnosis not present

## 2016-05-02 ENCOUNTER — Ambulatory Visit: Payer: Medicare HMO | Admitting: Endocrinology

## 2016-05-08 ENCOUNTER — Ambulatory Visit (INDEPENDENT_AMBULATORY_CARE_PROVIDER_SITE_OTHER): Payer: Medicare HMO | Admitting: Endocrinology

## 2016-05-08 ENCOUNTER — Encounter: Payer: Self-pay | Admitting: Endocrinology

## 2016-05-08 VITALS — BP 160/92 | HR 102 | Temp 98.0°F | Resp 16 | Ht 72.0 in | Wt 287.4 lb

## 2016-05-08 DIAGNOSIS — Z794 Long term (current) use of insulin: Secondary | ICD-10-CM | POA: Diagnosis not present

## 2016-05-08 DIAGNOSIS — E1165 Type 2 diabetes mellitus with hyperglycemia: Secondary | ICD-10-CM

## 2016-05-08 DIAGNOSIS — I10 Essential (primary) hypertension: Secondary | ICD-10-CM

## 2016-05-08 MED ORDER — AMLODIPINE BESYLATE 10 MG PO TABS: 10.0000 mg | ORAL_TABLET | Freq: Every day | ORAL | 2 refills | Status: DC

## 2016-05-08 MED ORDER — GABAPENTIN 300 MG PO CAPS: 300.0000 mg | ORAL_CAPSULE | Freq: Three times a day (TID) | ORAL | 3 refills | Status: DC

## 2016-05-08 MED ORDER — INSULIN NPH ISOPHANE & REGULAR (70-30) 100 UNIT/ML ~~LOC~~ SUSP: SUBCUTANEOUS | 3 refills | Status: DC

## 2016-05-08 NOTE — Progress Notes (Signed)
Patient ID: Matthew Barnes, male   DOB: Mar 26, 1956, 61 y.o.   MRN: [id number]    Reason for Appointment: Followup for Type 2 Diabetes  Referring physician:  Maudie Mercury  History of Present Illness:          Diagnosis: Type 2 diabetes mellitus, date of diagnosis:   2011       Past history:  He was started on metformin at diagnosis and apparently did have fairly good control initially However about a year later because of poor control he was given insulin in addition. He had been taking Lantus insulin until about 2/15, up to 80 units a day However he does not think his blood sugars  Were controlled with this Because of higher A1c of 13.1% he was referred here for diabetes management in 6/15; was started on glucose monitoring and insulin was changed from low dose Levemir to Humalog mix 70 units a day He was also started on Victoza  to help with blood sugar control and facilitate weight loss on his initial consultation  Recent history:   INSULIN regimen is described as: Novolin mix 70/30, 40-50 units ac qd-tid using syringe   A1c in 1/18 was much higher at 8.2 and previously has low as 6.2 He was started on Invokana He has previously gone off Victoza because of cost    Current blood sugar patterns, management of diabetes and problems:   He apparently called after starting Invokana about numbness in his feet.  Also he went off the medication on his own in a couple of weeks and he does not know why.  He was told to try Jardiance also but he did not take this because of the high cost  However his blood sugars started improving after starting Invokana and are mostly improved also now recently  He has to defend glucose monitors, now using the One Touch recently  Blood sugars on each download from last month at this month are averaging about 180, recently better  Apparently has changes diet significantly and is eating more salads, probably getting less fast food also.  Previously was having  difficulty controlling portions and carbohydrates because of his depression  He has started using a stationary exercise machine at home, previously not exercising at all  His weight has come down significantly despite even stopping Invokana  No recent labs available   Oral hypoglycemic drugs the patient is taking are: Metformin 1 g twice a day      Side effects from medications have been: None  Glucose monitoring:  done less than 1 times a day         Glucometer:  Verio recently, previously Accu-Chek  Blood Glucose readings from review of monitor as below  Mean values apply above for all meters except median for One Touch  PRE-MEAL Fasting Lunch Dinner Bedtime Overall  Glucose range: 108-197  91, 94     Mean/median: 168    177   POST-MEAL PC Breakfast PC Lunch PC Dinner  Glucose range: 115-224  171-2 36  107, 177   Mean/median:       Glycemic control:   Lab Results  Component Value Date   HGBA1C 8.2 (H) 04/01/2016   HGBA1C 7.7 (H) 12/20/2015   HGBA1C 6.2 06/12/2015   Lab Results  Component Value Date   MICROALBUR 4.2 (H) 04/04/2016   LDLCALC 27 12/20/2015   CREATININE 1.13 04/01/2016    Self-care: The diet that the patient has been following is: None, unable to  control portions and carbs  when depressed  Meals: 3 meals per day (dinner 6 pm-10 pm)          Exercise: Elliptical Recently        Dietician visit: Most recent: never      CDE visit: 08/2013             Weight history: baseline 295  Wt Readings from Last 3 Encounters:  05/08/16 287 lb 6 oz (130.4 kg)  04/29/16 (!) 301 lb 11.2 oz (136.9 kg)  04/04/16 294 lb (133.4 kg)   No visits with results within 1 Week(s) from this visit.  Latest known visit with results is:  Office Visit on 04/29/2016  Component Date Value Ref Range Status  . Vitamin B-12 04/29/2016 459  211 - 911 pg/mL Final  . WBC 04/29/2016 6.6  4.0 - 10.5 K/uL Final  . RBC 04/29/2016 5.31  4.22 - 5.81 Mil/uL Final  . Platelets  04/29/2016 150.0  150.0 - 400.0 K/uL Final  . Hemoglobin 04/29/2016 13.7  13.0 - 17.0 g/dL Final  . HCT 04/29/2016 40.4  39.0 - 52.0 % Final  . MCV 04/29/2016 76.1* 78.0 - 100.0 fl Final  . MCHC 04/29/2016 33.9  30.0 - 36.0 g/dL Final  . RDW 04/29/2016 14.0  11.5 - 15.5 % Final      Allergies as of 05/08/2016   No Known Allergies     Medication List       Accurate as of 05/08/16  2:35 PM. Always use your most recent med list.          ACCU-CHEK AVIVA PLUS w/Device Kit 1 each by Does not apply route 2 (two) times daily. Dx code is E11.42   accu-chek multiclix lancets Use as instructed   amLODipine 5 MG tablet Commonly known as:  NORVASC Take 1 tablet (5 mg total) by mouth daily.   EQ LORATADINE 10 MG tablet Generic drug:  loratadine TAKE ONE TABLET BY MOUTH ONCE DAILY.   gabapentin 300 MG capsule Commonly known as:  NEURONTIN Take 1 capsule (300 mg total) by mouth 3 (three) times daily.   glucose blood test strip Commonly known as:  ACCU-CHEK AVIVA PLUS Use to test blood sugar 2 times daily   glucose blood test strip Commonly known as:  ONETOUCH VERIO Use as instructed to check blood sugar three times a day   insulin NPH-regular Human (70-30) 100 UNIT/ML injection Commonly known as:  NOVOLIN 70/30 RELION INJECT 50 UNITS UNDER THE SKIN 3 TIMES DAILY AS DIRECTED   INSULIN SYRINGE 1CC/31GX5/16" 31G X 5/16" 1 ML Misc Use 3 needles per day   losartan 100 MG tablet Commonly known as:  COZAAR Take 1 tablet (100 mg total) by mouth daily.   lovastatin 20 MG tablet Commonly known as:  MEVACOR Take 1 tablet (20 mg total) by mouth at bedtime.   metFORMIN 1000 MG tablet Commonly known as:  GLUCOPHAGE Take 1 tablet (1,000 mg total) by mouth 2 (two) times daily with a meal.   omeprazole 20 MG capsule Commonly known as:  PRILOSEC Take 1 capsule (20 mg total) by mouth daily.       Allergies: No Known Allergies  Past Medical History:  Diagnosis Date  . Bipolar  II disorder - Managed by Marye Round [phone]) 05/03/2013  . Diabetes mellitus   . Hyperlipidemia   . Hypertension   . Unspecified hypothyroidism 05/03/2013    Past Surgical History:  Procedure Laterality Date  . KNEE SURGERY  Family History  Problem Relation Age of Onset  . Diabetes Mother   . Hypertension Mother   . Heart disease Father   . Heart attack Father 67    Social History:  reports that he has never smoked. He has never used smokeless tobacco. He reports that he does not drink alcohol or use drugs.    Review of Systems   HYPERTENSION:  Currently taking losartan 100 mg and Norvasc; HCTZ was Stopped because of polyuria interfering with his work He says he did not take his medication today   BP Readings from Last 3 Encounters:  05/08/16 (!) 160/92  04/29/16 138/84  04/04/16 (!) 142/80         LIPIDS: He has had marked increase in triglycerides previously.  Currently only on 20 mg lovastatin And also followed by PCP        Lab Results  Component Value Date   CHOL 87 12/20/2015   HDL 24.90 (L) 12/20/2015   LDLCALC 27 12/20/2015   LDLDIRECT 43.0 07/24/2014   TRIG 174.0 (H) 12/20/2015   CHOLHDL 3 12/20/2015       Thyroid:  Has had mild hypothyroidism for a few years, Possibly when he was taking lithium TSH appears normal consistently without taking any levothyroxine    Lab Results  Component Value Date   TSH 0.89 04/01/2016        He has been complaining of numbness in his feet especially on the right side which is more dominant but does not complain of any increased sensitivity, sharp pains or burning He is concerned about increasing symptoms  Recently had foot exam from PCP and also B-12 was normal  Patchy decrease in sensation in the toes especially right and decreased on the plantar surface also He is also followed by a podiatrist   LABS:  No visits with results within 1 Week(s) from this visit.  Latest known visit with  results is:  Office Visit on 04/29/2016  Component Date Value Ref Range Status  . Vitamin B-12 04/29/2016 459  211 - 911 pg/mL Final  . WBC 04/29/2016 6.6  4.0 - 10.5 K/uL Final  . RBC 04/29/2016 5.31  4.22 - 5.81 Mil/uL Final  . Platelets 04/29/2016 150.0  150.0 - 400.0 K/uL Final  . Hemoglobin 04/29/2016 13.7  13.0 - 17.0 g/dL Final  . HCT 04/29/2016 40.4  39.0 - 52.0 % Final  . MCV 04/29/2016 76.1* 78.0 - 100.0 fl Final  . MCHC 04/29/2016 33.9  30.0 - 36.0 g/dL Final  . RDW 04/29/2016 14.0  11.5 - 15.5 % Final    Physical Examination:  BP (!) 160/92   Pulse (!) 102   Temp 98 F (36.7 C) (Oral)   Resp 16   Ht 6' (1.829 m)   Wt 287 lb 6 oz (130.4 kg)   SpO2 95%   BMI 38.98 kg/m        ASSESSMENT/PLAN:   Diabetes type 2, uncontrolled    See history of present illness for detailed discussion of his current management, blood sugar patterns and problems identified  His blood sugar control is appearing much improved because of his improved compliance with diet Appears to be much more motivated than before to watch his diet, previously was having issues because of depression also However blood sugars are not consistent partly because of taking premixed insulin on also variability in his carbohydrate intake at various times Currently adjusting his insulin based on previous blood sugar Although he probably started  doing better with taking Invokana he apparently cannot afford this He still is taking insulin with a syringe because his insurance apparently does not cover certain injectable pen devices adequately  He is also starting to be more active and is losing weight  Hypertension: Systolic is high again today   Recommendations:  He will check to see if his insurance will cover NovoLog or Humalog mix that he can use with the pen which will be more convenient especially when he is working.  He will try to adjust his insulin dose based on what he is planning to eat rather than  pre-meal blood sugar  Again emphasized need to check blood sugars at various times including after supper.  He is a good candidate for the freestyle Libre sensor but unlikely this will be covered by insurance  Continue consistent diet and exercise but add protein to each meal and also some carbohydrate consistently  May reduce the dose to 35 units for eating just a salad, avoid increasing doses more than 45 units at suppertime to prevent overnight hypoglycemia  Recheck A1c on the next visit  For hypertension he will increase his AMLODIPINE to 10 mg  Patient Instructions  Check blood sugars on waking up     Also check blood sugars about 2 hours after a meal and do this after different meals by rotation  Recommended blood sugar levels on waking up is 90-130 and about 2 hours after meal is 130-160  Please bring your blood sugar monitor to each visit, thank you  Adjust insulin basd on Carbs and portions      Counseling time on subjects discussed above is over 50% of today's 25 minute visit   Tyshun Tuckerman 05/08/2016, 2:35 PM   Note: This office note was prepared with Dragon voice recognition system technology. Any transcriptional errors that result from this process are unintentional.

## 2016-05-08 NOTE — Patient Instructions (Addendum)
Check blood sugars on waking up     Also check blood sugars about 2 hours after a meal and do this after different meals by rotation  Recommended blood sugar levels on waking up is 90-130 and about 2 hours after meal is 130-160  Please bring your blood sugar monitor to each visit, thank you  Adjust insulin basd on Carbs and portions  Take 10mg  Amlodipine

## 2016-05-08 NOTE — Telephone Encounter (Signed)
ERROR

## 2016-05-12 ENCOUNTER — Other Ambulatory Visit: Payer: Self-pay

## 2016-05-12 DIAGNOSIS — R69 Illness, unspecified: Secondary | ICD-10-CM | POA: Diagnosis not present

## 2016-05-29 DIAGNOSIS — R69 Illness, unspecified: Secondary | ICD-10-CM | POA: Diagnosis not present

## 2016-06-19 DIAGNOSIS — R69 Illness, unspecified: Secondary | ICD-10-CM | POA: Diagnosis not present

## 2016-06-29 ENCOUNTER — Other Ambulatory Visit: Payer: Self-pay | Admitting: Family Medicine

## 2016-06-30 DIAGNOSIS — R69 Illness, unspecified: Secondary | ICD-10-CM | POA: Diagnosis not present

## 2016-07-21 DIAGNOSIS — M1712 Unilateral primary osteoarthritis, left knee: Secondary | ICD-10-CM | POA: Diagnosis not present

## 2016-07-21 DIAGNOSIS — M25562 Pain in left knee: Secondary | ICD-10-CM | POA: Diagnosis not present

## 2016-07-21 DIAGNOSIS — M1711 Unilateral primary osteoarthritis, right knee: Secondary | ICD-10-CM | POA: Diagnosis not present

## 2016-07-21 DIAGNOSIS — M25561 Pain in right knee: Secondary | ICD-10-CM | POA: Diagnosis not present

## 2016-07-28 ENCOUNTER — Encounter: Payer: Self-pay | Admitting: Family Medicine

## 2016-07-28 ENCOUNTER — Ambulatory Visit (INDEPENDENT_AMBULATORY_CARE_PROVIDER_SITE_OTHER): Payer: Medicare HMO | Admitting: Family Medicine

## 2016-07-28 VITALS — BP 140/80 | HR 100 | Temp 98.4°F | Ht 72.0 in | Wt 305.1 lb

## 2016-07-28 DIAGNOSIS — I10 Essential (primary) hypertension: Secondary | ICD-10-CM | POA: Diagnosis not present

## 2016-07-28 DIAGNOSIS — E1142 Type 2 diabetes mellitus with diabetic polyneuropathy: Secondary | ICD-10-CM

## 2016-07-28 DIAGNOSIS — E785 Hyperlipidemia, unspecified: Secondary | ICD-10-CM | POA: Diagnosis not present

## 2016-07-28 DIAGNOSIS — E1159 Type 2 diabetes mellitus with other circulatory complications: Secondary | ICD-10-CM | POA: Diagnosis not present

## 2016-07-28 DIAGNOSIS — F3160 Bipolar disorder, current episode mixed, unspecified: Secondary | ICD-10-CM

## 2016-07-28 DIAGNOSIS — I152 Hypertension secondary to endocrine disorders: Secondary | ICD-10-CM

## 2016-07-28 DIAGNOSIS — R69 Illness, unspecified: Secondary | ICD-10-CM | POA: Diagnosis not present

## 2016-07-28 MED ORDER — HYDROCHLOROTHIAZIDE 12.5 MG PO TABS: 12.5000 mg | ORAL_TABLET | Freq: Every day | ORAL | 3 refills | Status: DC

## 2016-07-28 NOTE — Progress Notes (Signed)
Pre visit review using our clinic review tool, if applicable. No additional management support is needed unless otherwise documented below in the visit note. 

## 2016-07-28 NOTE — Progress Notes (Signed)
HPI:     Matthew Barnes is a pleasant 61 y.o. here for follow up. Chronic medical problems summarized below were reviewed for changes and stability and were updated as needed below. These issues and their treatment remain stable for the most part. Looks like his endocrinologist added Gabapentin for the neuropathy which he feels is working. Is seeing new psychiatrist and take Seroquel "as needed" per report. reports is doing labs with endocrinologist soon. Has felt well. Denies CP, SOB, DOE, treatment intolerance or new symptoms. Due for labs, eye exam, hep c screen, medicare exam with susan.  HTN: -meds:amlodipine, losartan  HLD: -meds: lovastatin  GERD: -meds: low dose PPI - omeprazole  Bipolar Disorder: -sees psychiatry for management  DM w/ neuropathy: -sees endocrinology -meds: gabapentin, insulin, metformin, arb -eye doctor:    ROS: See pertinent positives and negatives per HPI.  Past Medical History:  Diagnosis Date  . Bipolar II disorder - Managed by Marye Round [phone]) 05/03/2013  . Diabetes mellitus   . Hyperlipidemia   . Hypertension   . Unspecified hypothyroidism 05/03/2013    Past Surgical History:  Procedure Laterality Date  . KNEE SURGERY      Family History  Problem Relation Age of Onset  . Diabetes Mother   . Hypertension Mother   . Heart disease Father   . Heart attack Father 39    Social History   Social History  . Marital status: Single    Spouse name: N/A  . Number of children: N/A  . Years of education: N/A   Social History Main Topics  . Smoking status: Never Smoker  . Smokeless tobacco: Never Used  . Alcohol use No  . Drug use: No  . Sexual activity: No   Other Topics Concern  . None   Social History Narrative   Work or School: on disability for knee arthritis      Home Situation: lives alone      Spiritual Beliefs:Christian      Lifestyle:no regular exercising; diet not great              Current  Outpatient Prescriptions:  .  amLODipine (NORVASC) 10 MG tablet, Take 1 tablet (10 mg total) by mouth daily., Disp: 30 tablet, Rfl: 2 .  EQ LORATADINE 10 MG tablet, TAKE ONE TABLET BY MOUTH ONCE DAILY., Disp: 30 tablet, Rfl: 0 .  gabapentin (NEURONTIN) 300 MG capsule, Take 1 capsule (300 mg total) by mouth 3 (three) times daily., Disp: 90 capsule, Rfl: 3 .  insulin NPH-regular Human (NOVOLIN 70/30 RELION) (70-30) 100 UNIT/ML injection, INJECT 50 UNITS UNDER THE SKIN 3 TIMES DAILY AS DIRECTED, Disp: 40 mL, Rfl: 3 .  Insulin Syringe-Needle U-100 (INSULIN SYRINGE 1CC/31GX5/16") 31G X 5/16" 1 ML MISC, Use 3 needles per day, Disp: 100 each, Rfl: 3 .  Lancets (ACCU-CHEK MULTICLIX) lancets, Use as instructed, Disp: 100 each, Rfl: 12 .  losartan (COZAAR) 100 MG tablet, Take 1 tablet (100 mg total) by mouth daily., Disp: 90 tablet, Rfl: 3 .  lovastatin (MEVACOR) 20 MG tablet, Take 1 tablet (20 mg total) by mouth at bedtime., Disp: 90 tablet, Rfl: 3 .  omeprazole (PRILOSEC) 20 MG capsule, Take 1 capsule (20 mg total) by mouth daily., Disp: 90 capsule, Rfl: 3 .  ONETOUCH VERIO test strip, USE AS INSTRUCTED TO CHECK BLOOD SUGAR THREE TIMES A DAY, Disp: 100 each, Rfl: 3 .  hydrochlorothiazide (HYDRODIURIL) 12.5 MG tablet, Take 1 tablet (12.5 mg total) by mouth daily., Disp:  30 tablet, Rfl: 3  EXAM:  Vitals:   07/28/16 1419 07/28/16 1422  BP: 140/90 140/80  Pulse: 100   Temp:  98.4 F (36.9 C)    Body mass index is 41.38 kg/m.  GENERAL: vitals reviewed and listed above, alert, oriented, appears well hydrated and in no acute distress  HEENT: atraumatic, conjunttiva clear, no obvious abnormalities on inspection of external nose and ears  NECK: no obvious masses on inspection  LUNGS: clear to auscultation bilaterally, no wheezes, rales or rhonchi, good air movement  CV: HRRR, tr peripheral edema  MS: moves all extremities without noticeable abnormality  PSYCH: pleasant and cooperative, no  obvious depression or anxiety  ASSESSMENT AND PLAN:  Discussed the following assessment and plan:  DM type 2 with diabetic peripheral neuropathy (HCC)  Hypertension associated with diabetes ([place])  Hyperlipidemia, unspecified hyperlipidemia type  Morbid obesity ([place])  Bipolar affective, mixed ([place])  -discussed options for swelling and BP - opted to start low dose hctz -recheck and AWV in 1 month -lifestyle recs -Patient advised to return or notify a doctor immediately if symptoms worsen or persist or new concerns arise.  Patient Instructions  BEFORE YOU LEAVE: -follow up: 1) Medicare Exam with Manuela Schwartz in 1 months w/ f/u with Dr. Maudie Mercury for blood pressure  Star the Hydrochlorthiazide 12.5 mg and take once daily to help blood pressure and swelling.  Start getting regular exercise.  Eat a healthy diet.   We recommend the following healthy lifestyle for LIFE: 1) Small portions.   Tip: eat off of a salad plate instead of a dinner plate.  Tip: It is ok to feel hungry after a meal.  Tip: if you need more or a snack choose fruits, veggies and/or a handful of nuts or seeds.  2) Eat a healthy clean diet.  * Tip: Avoid (less then 1 serving per week): processed foods, sweets, sweetened drinks, white starches (rice, flour, bread, potatoes, pasta, etc), red meat, fast foods, butter  *Tip: CHOOSE instead   * 5-9 servings per day of fresh or frozen fruits and vegetables (but not corn, potatoes, bananas, canned or dried fruit)   *nuts and seeds, beans   *olives and olive oil   *small portions of lean meats such as fish and white chicken    *small portions of whole grains  3)Get at least 150 minutes of sweaty aerobic exercise per week.  4)Reduce stress - consider counseling, meditation and relaxation to balance other aspects of your life.      Colin Benton R., DO

## 2016-07-28 NOTE — Patient Instructions (Addendum)
BEFORE YOU LEAVE: -follow up: 1) Medicare Exam with Manuela Schwartz in 1 months w/ f/u with Dr. Maudie Mercury for blood pressure  Star the Hydrochlorthiazide 12.5 mg and take once daily to help blood pressure and swelling.  Start getting regular exercise.  Eat a healthy diet.   We recommend the following healthy lifestyle for LIFE: 1) Small portions.   Tip: eat off of a salad plate instead of a dinner plate.  Tip: It is ok to feel hungry after a meal.  Tip: if you need more or a snack choose fruits, veggies and/or a handful of nuts or seeds.  2) Eat a healthy clean diet.  * Tip: Avoid (less then 1 serving per week): processed foods, sweets, sweetened drinks, white starches (rice, flour, bread, potatoes, pasta, etc), red meat, fast foods, butter  *Tip: CHOOSE instead   * 5-9 servings per day of fresh or frozen fruits and vegetables (but not corn, potatoes, bananas, canned or dried fruit)   *nuts and seeds, beans   *olives and olive oil   *small portions of lean meats such as fish and white chicken    *small portions of whole grains  3)Get at least 150 minutes of sweaty aerobic exercise per week.  4)Reduce stress - consider counseling, meditation and relaxation to balance other aspects of your life.

## 2016-07-29 ENCOUNTER — Other Ambulatory Visit: Payer: Self-pay | Admitting: Endocrinology

## 2016-07-30 DIAGNOSIS — R69 Illness, unspecified: Secondary | ICD-10-CM | POA: Diagnosis not present

## 2016-08-01 ENCOUNTER — Other Ambulatory Visit (INDEPENDENT_AMBULATORY_CARE_PROVIDER_SITE_OTHER): Payer: Medicare HMO

## 2016-08-01 DIAGNOSIS — E1165 Type 2 diabetes mellitus with hyperglycemia: Secondary | ICD-10-CM

## 2016-08-01 DIAGNOSIS — Z794 Long term (current) use of insulin: Secondary | ICD-10-CM | POA: Diagnosis not present

## 2016-08-01 LAB — COMPREHENSIVE METABOLIC PANEL
ALT: 29 U/L (ref 0–53)
AST: 26 U/L (ref 0–37)
Albumin: 4 g/dL (ref 3.5–5.2)
Alkaline Phosphatase: 60 U/L (ref 39–117)
BUN: 15 mg/dL (ref 6–23)
CO2: 28 meq/L (ref 19–32)
Calcium: 9.4 mg/dL (ref 8.4–10.5)
Chloride: 105 mEq/L (ref 96–112)
Creatinine, Ser: 1.21 mg/dL (ref 0.40–1.50)
GFR: 64.93 mL/min (ref >60.00)
GLUCOSE: 211 mg/dL — AB (ref 70–99)
Potassium: 3.7 mEq/L (ref 3.5–5.1)
SODIUM: 139 meq/L (ref 135–145)
TOTAL PROTEIN: 6.5 g/dL (ref 6.0–8.3)
Total Bilirubin: 0.4 mg/dL (ref 0.2–1.2)

## 2016-08-01 LAB — LIPID PANEL
CHOL/HDL RATIO: 4
Cholesterol: 94 mg/dL (ref 0–200)
HDL: 23.4 mg/dL — AB (ref >39.00)
LDL Cholesterol: 43 mg/dL (ref 0–99)
NONHDL: 70.38
Triglycerides: 139 mg/dL (ref 0.0–149.0)
VLDL: 27.8 mg/dL (ref 0.0–40.0)

## 2016-08-01 LAB — HEMOGLOBIN A1C: Hgb A1c MFr Bld: 8 % — ABNORMAL HIGH (ref 4.6–6.5)

## 2016-08-05 ENCOUNTER — Encounter: Payer: Self-pay | Admitting: Endocrinology

## 2016-08-05 ENCOUNTER — Ambulatory Visit (INDEPENDENT_AMBULATORY_CARE_PROVIDER_SITE_OTHER): Payer: Medicare HMO | Admitting: Endocrinology

## 2016-08-05 VITALS — BP 146/96 | HR 94 | Ht 72.0 in | Wt 302.0 lb

## 2016-08-05 DIAGNOSIS — E1165 Type 2 diabetes mellitus with hyperglycemia: Secondary | ICD-10-CM

## 2016-08-05 DIAGNOSIS — Z794 Long term (current) use of insulin: Secondary | ICD-10-CM | POA: Diagnosis not present

## 2016-08-05 DIAGNOSIS — I10 Essential (primary) hypertension: Secondary | ICD-10-CM | POA: Diagnosis not present

## 2016-08-05 MED ORDER — VICTOZA 18 MG/3ML ~~LOC~~ SOPN: 1.8000 mg | PEN_INJECTOR | Freq: Every day | SUBCUTANEOUS | 3 refills | Status: DC

## 2016-08-05 MED ORDER — METFORMIN HCL 1000 MG PO TABS: 1000.0000 mg | ORAL_TABLET | Freq: Two times a day (BID) | ORAL | 0 refills | Status: DC

## 2016-08-05 NOTE — Progress Notes (Signed)
Patient ID: Matthew Barnes, male   DOB: 10/09/1955, 61 y.o.   MRN: [id number]    Reason for Appointment: Followup for Type 2 Diabetes  Referring physician:  Maudie Mercury  History of Present Illness:          Diagnosis: Type 2 diabetes mellitus, date of diagnosis:   2011       Past history:  He was started on metformin at diagnosis and apparently did have fairly good control initially However about a year later because of poor control he was given insulin in addition. He had been taking Lantus insulin until about 2/15, up to 80 units a day However he does not think his blood sugars  Were controlled with this Because of higher A1c of 13.1% he was referred here for diabetes management in 6/15; was started on glucose monitoring and insulin was changed from low dose Levemir to Humalog mix 70 units a day He was also started on Victoza  to help with blood sugar control and facilitate weight loss on his initial consultation  Recent history:   INSULIN regimen is described as: Novolin mix 70/30, 40 units once or twice a day  A1cpis still high at 8%, reviously has  been as low as 6.2 He has previously gone off Victoza because of cost    Current blood sugar patterns, management of diabetes and problems:  His blood sugars are still poorly controlled  He usually cannot take brand-name medications because of cost such as Invokana, Victoza and Jardiance  Even though he has been told to take his insulin before eating is not saying that he takes it after eating usually  Also sometimes will forget to take the insulin in the evening, will take the dose at lunchtime also sporadically  He has gained 15 pounds since his last visit  Only recently started cutting back on sweets and eating at night  However he has exercise only irregularly even though he has an exercise machine at home  He usually does not check readings before breakfast but they appear to be high  Blood sugars are the highest midday and  early afternoon and not consistently high after supper   Oral hypoglycemic drugs the patient is taking are: ?  Not taking Metformin 1 g twice a day      Side effects from medications have been: None  Glucose monitoring:  done less than 1 times a day         Glucometer:  Verio recently, previously Accu-Chek  Blood Glucose readings from review of monitor as below  Mean values apply above for all meters except median for One Touch  PRE-MEAL Fasting Lunch Dinner Bedtime Overall  Glucose range: 180, 212  161-333   1 45-226     Mean/median:     227    POST-MEAL PC Breakfast PC Lunch PC Dinner  Glucose range: 1 98-292  144-407   1 79-276   Mean/median: 260  200  207     Glycemic control:   Lab Results  Component Value Date   HGBA1C 8.0 (H) 08/01/2016   HGBA1C 8.2 (H) 04/01/2016   HGBA1C 7.7 (H) 12/20/2015   Lab Results  Component Value Date   MICROALBUR 4.2 (H) 04/04/2016   LDLCALC 43 08/01/2016   CREATININE 1.21 08/01/2016    Self-care: The diet that the patient has been following is: None, unable to control portions and carbs  when depressed  Meals: 3 meals per day (dinner 6 pm-10 pm)  Exercise: Elliptical 1/7        Dietician visit: Most recent: never      CDE visit: 08/2013             Weight history: baseline 295  Wt Readings from Last 3 Encounters:  08/05/16 (!) 302 lb (137 kg)  07/28/16 (!) 305 lb 1.6 oz (138.4 kg)  05/08/16 287 lb 6 oz (130.4 kg)   Lab on 08/01/2016  Component Date Value Ref Range Status  . Hgb A1c MFr Bld 08/01/2016 8.0* 4.6 - 6.5 % Final  . Sodium 08/01/2016 139  135 - 145 mEq/L Final  . Potassium 08/01/2016 3.7  3.5 - 5.1 mEq/L Final  . Chloride 08/01/2016 105  96 - 112 mEq/L Final  . CO2 08/01/2016 28  19 - 32 mEq/L Final  . Glucose, Bld 08/01/2016 211* 70 - 99 mg/dL Final  . BUN 08/01/2016 15  6 - 23 mg/dL Final  . Creatinine, Ser 08/01/2016 1.21  0.40 - 1.50 mg/dL Final  . Total Bilirubin 08/01/2016 0.4  0.2 - 1.2 mg/dL  Final  . Alkaline Phosphatase 08/01/2016 60  39 - 117 U/L Final  . AST 08/01/2016 26  0 - 37 U/L Final  . ALT 08/01/2016 29  0 - 53 U/L Final  . Total Protein 08/01/2016 6.5  6.0 - 8.3 g/dL Final  . Albumin 08/01/2016 4.0  3.5 - 5.2 g/dL Final  . Calcium 08/01/2016 9.4  8.4 - 10.5 mg/dL Final  . GFR 08/01/2016 64.93  >60.00 mL/min Final  . Cholesterol 08/01/2016 94  0 - 200 mg/dL Final  . Triglycerides 08/01/2016 139.0  0.0 - 149.0 mg/dL Final  . HDL 08/01/2016 23.40* >39.00 mg/dL Final  . VLDL 08/01/2016 27.8  0.0 - 40.0 mg/dL Final  . LDL Cholesterol 08/01/2016 43  0 - 99 mg/dL Final  . Total CHOL/HDL Ratio 08/01/2016 4   Final  . NonHDL 08/01/2016 70.38   Final      Allergies as of 08/05/2016   No Known Allergies     Medication List       Accurate as of 08/05/16  2:31 PM. Always use your most recent med list.          accu-chek multiclix lancets Use as instructed   amLODipine 10 MG tablet Commonly known as:  NORVASC TAKE 1 TABLET (10 MG TOTAL) BY MOUTH DAILY.   EQ LORATADINE 10 MG tablet Generic drug:  loratadine TAKE ONE TABLET BY MOUTH ONCE DAILY.   gabapentin 300 MG capsule Commonly known as:  NEURONTIN Take 1 capsule (300 mg total) by mouth 3 (three) times daily.   hydrochlorothiazide 12.5 MG tablet Commonly known as:  HYDRODIURIL Take 1 tablet (12.5 mg total) by mouth daily.   insulin NPH-regular Human (70-30) 100 UNIT/ML injection Commonly known as:  NOVOLIN 70/30 RELION INJECT 50 UNITS UNDER THE SKIN 3 TIMES DAILY AS DIRECTED   INSULIN SYRINGE 1CC/31GX5/16" 31G X 5/16" 1 ML Misc Use 3 needles per day   losartan 100 MG tablet Commonly known as:  COZAAR Take 1 tablet (100 mg total) by mouth daily.   lovastatin 20 MG tablet Commonly known as:  MEVACOR Take 1 tablet (20 mg total) by mouth at bedtime.   omeprazole 20 MG capsule Commonly known as:  PRILOSEC Take 1 capsule (20 mg total) by mouth daily.   ONETOUCH VERIO test strip Generic drug:   glucose blood USE AS INSTRUCTED TO CHECK BLOOD SUGAR THREE TIMES A DAY   VICTOZA  18 MG/3ML Sopn Generic drug:  liraglutide Inject 0.3 mLs (1.8 mg total) into the skin daily. Inject once daily at the same time       Allergies: No Known Allergies  Past Medical History:  Diagnosis Date  . Bipolar II disorder - Managed by Marye Round [phone]) 05/03/2013  . Diabetes mellitus   . Hyperlipidemia   . Hypertension   . Unspecified hypothyroidism 05/03/2013    Past Surgical History:  Procedure Laterality Date  . KNEE SURGERY      Family History  Problem Relation Age of Onset  . Diabetes Mother   . Hypertension Mother   . Heart disease Father   . Heart attack Father 72    Social History:  reports that he has never smoked. He has never used smokeless tobacco. He reports that he does not drink alcohol or use drugs.    Review of Systems   HYPERTENSION:  Currently taking losartan 100 mg and Norvasc 10, Recently seen by PCP   BP Readings from Last 3 Encounters:  08/05/16 (!) 146/96  07/28/16 140/80  05/08/16 (!) 160/92         LIPIDS: He has had marked increase in triglycerides previously.  Currently only on 20 mg lovastatin And also followed by PCP        Lab Results  Component Value Date   CHOL 94 08/01/2016   HDL 23.40 (L) 08/01/2016   LDLCALC 43 08/01/2016   LDLDIRECT 43.0 07/24/2014   TRIG 139.0 08/01/2016   CHOLHDL 4 08/01/2016       Thyroid:  Has had mild hypothyroidism for a few years, Possibly when he was taking lithium TSH appears normal consistently without taking any levothyroxine    Lab Results  Component Value Date   TSH 0.89 04/01/2016        He has been complaining of numbness in his feet especially on the right side which is more dominant but does not complain of any increased sensitivity, sharp pains or burning He is concerned about increasing symptoms  He had foot exam from PCP    Patchy decrease in sensation in the toes  especially right and decreased on the plantar surface also He is also followed by a podiatrist   LABS:  Lab on 08/01/2016  Component Date Value Ref Range Status  . Hgb A1c MFr Bld 08/01/2016 8.0* 4.6 - 6.5 % Final  . Sodium 08/01/2016 139  135 - 145 mEq/L Final  . Potassium 08/01/2016 3.7  3.5 - 5.1 mEq/L Final  . Chloride 08/01/2016 105  96 - 112 mEq/L Final  . CO2 08/01/2016 28  19 - 32 mEq/L Final  . Glucose, Bld 08/01/2016 211* 70 - 99 mg/dL Final  . BUN 08/01/2016 15  6 - 23 mg/dL Final  . Creatinine, Ser 08/01/2016 1.21  0.40 - 1.50 mg/dL Final  . Total Bilirubin 08/01/2016 0.4  0.2 - 1.2 mg/dL Final  . Alkaline Phosphatase 08/01/2016 60  39 - 117 U/L Final  . AST 08/01/2016 26  0 - 37 U/L Final  . ALT 08/01/2016 29  0 - 53 U/L Final  . Total Protein 08/01/2016 6.5  6.0 - 8.3 g/dL Final  . Albumin 08/01/2016 4.0  3.5 - 5.2 g/dL Final  . Calcium 08/01/2016 9.4  8.4 - 10.5 mg/dL Final  . GFR 08/01/2016 64.93  >60.00 mL/min Final  . Cholesterol 08/01/2016 94  0 - 200 mg/dL Final  . Triglycerides 08/01/2016 139.0  0.0 - 149.0 mg/dL Final  .  HDL 08/01/2016 23.40* >39.00 mg/dL Final  . VLDL 08/01/2016 27.8  0.0 - 40.0 mg/dL Final  . LDL Cholesterol 08/01/2016 43  0 - 99 mg/dL Final  . Total CHOL/HDL Ratio 08/01/2016 4   Final  . NonHDL 08/01/2016 70.38   Final    Physical Examination:  BP (!) 146/96   Pulse 94   Ht 6' (1.829 m)   Wt (!) 302 lb (137 kg)   SpO2 96%   BMI 40.96 kg/m        ASSESSMENT/PLAN:   Diabetes type 2, uncontrolled    See history of present illness for detailed discussion of his current management, blood sugar patterns and problems identified  His blood sugar control is Worse again with getting inadequate insulin Also timing of insulin is not preprandial as directed especially in the evenings Also not consistent with his doses of the evening He also supposed to be taking some insulin at lunchtime if he is eating significant amount of  carbohydrate but he is not doing so A lot of his treatment is limited by affordability of brand name medications  Today discussed day-to-day management of his diabetes in detail  Hypertension:  Blood pressure followed by PCP and appears to be getting white coat syndrome today   Recommendations:  He will check to see if his insurance will cover Victoza, prescription sent  If he will pick this up and he can start with 0.6 mg and increase every 2-3 days until maximum dose of 1.8  He needs to take insulin 30 min before eating consistently especially in the morning before breakfast as his blood sugar have been the highest midday  Discussed action profile of the premixed insulin and need for covering at least breakfast and supper meals with adequate insulin on time  He will increase the dose at least 10 units in the morning and then another 5 units to a maximum of 55 units in the morning to get blood sugars back to target after meals which would be at least under and 80 or less  He will increase his evening dose by 5 units since fasting readings are also mostly high, try taking 45 units  Follow-up with nurse educator in about a month  Recheck A1c on the next visit  Exercise daily, stressed the importance of doing this regularly  Continue to improve diet  Patient Instructions  Must take INSULIN 30 MIN BEFORE Bfst and Supper  Take 55 in am and 45 at supper, may go up gradually  Victoza upto 1.8 mg daily at same time  Exercise daily   Counseling time on subjects discussed above is over 50% of today's 25 minute visit   Lashanna Angelo 08/05/2016, 2:31 PM   Note: This office note was prepared with Dragon voice recognition system technology. Any transcriptional errors that result from this process are unintentional.

## 2016-08-05 NOTE — Patient Instructions (Addendum)
Must take INSULIN 30 MIN BEFORE Bfst and Supper  Take 55 in am and 45 at supper, may go up gradually  Victoza upto 1.8 mg daily at same time  Exercise daily

## 2016-08-27 ENCOUNTER — Other Ambulatory Visit: Payer: Self-pay | Admitting: Endocrinology

## 2016-08-27 DIAGNOSIS — R69 Illness, unspecified: Secondary | ICD-10-CM | POA: Diagnosis not present

## 2016-08-27 NOTE — Progress Notes (Signed)
HPI:  HTN/LE Edema: -started hctz last month -other meds: norvasc, losartan -PMH DM, obesity, HLD -? Matthew Barnes -seeing Manuela Schwartz for AWV today - see notes, he is excited to make further changes to diet -reports swelling better  ROS: See pertinent positives and negatives per HPI.  Past Medical History:  Diagnosis Date  . Bipolar II disorder - Managed by Marye Round [phone]) 05/03/2013  . Diabetes mellitus   . Hyperlipidemia   . Hypertension   . Unspecified hypothyroidism 05/03/2013    Past Surgical History:  Procedure Laterality Date  . KNEE SURGERY      Family History  Problem Relation Age of Onset  . Diabetes Mother   . Hypertension Mother   . Heart disease Father   . Heart attack Father 24    Social History   Social History  . Marital status: Single    Spouse name: N/A  . Number of children: N/A  . Years of education: N/A   Social History Main Topics  . Smoking status: Never Smoker  . Smokeless tobacco: Never Used  . Alcohol use No  . Drug use: No  . Sexual activity: No   Other Topics Concern  . Not on file   Social History Narrative   Work or School: on disability for knee arthritis      Home Situation: lives alone      Spiritual Beliefs:Christian      Lifestyle:no regular exercising; diet not great              Current Outpatient Prescriptions:  .  amLODipine (NORVASC) 10 MG tablet, TAKE 1 TABLET (10 MG TOTAL) BY MOUTH DAILY., Disp: 30 tablet, Rfl: 2 .  EQ LORATADINE 10 MG tablet, TAKE ONE TABLET BY MOUTH ONCE DAILY., Disp: 30 tablet, Rfl: 0 .  gabapentin (NEURONTIN) 300 MG capsule, TAKE 1 CAPSULE (300 MG TOTAL) BY MOUTH 3 (THREE) TIMES DAILY., Disp: 90 capsule, Rfl: 3 .  hydrochlorothiazide (HYDRODIURIL) 12.5 MG tablet, Take 1 tablet (12.5 mg total) by mouth daily., Disp: 30 tablet, Rfl: 3 .  insulin NPH-regular Human (NOVOLIN 70/30 RELION) (70-30) 100 UNIT/ML injection, INJECT 50 UNITS UNDER THE SKIN 3 TIMES DAILY AS DIRECTED (Patient  taking differently: INJECT 40 UNITS UNDER THE SKIN 3 TIMES DAILY AS DIRECTED), Disp: 40 mL, Rfl: 3 .  Insulin Syringe-Needle U-100 (INSULIN SYRINGE 1CC/31GX5/16") 31G X 5/16" 1 ML MISC, Use 3 needles per day, Disp: 100 each, Rfl: 3 .  Lancets (ACCU-CHEK MULTICLIX) lancets, Use as instructed, Disp: 100 each, Rfl: 12 .  losartan (COZAAR) 100 MG tablet, Take 1 tablet (100 mg total) by mouth daily., Disp: 90 tablet, Rfl: 3 .  lovastatin (MEVACOR) 20 MG tablet, Take 1 tablet (20 mg total) by mouth at bedtime., Disp: 90 tablet, Rfl: 3 .  metFORMIN (GLUCOPHAGE) 1000 MG tablet, Take 1 tablet (1,000 mg total) by mouth 2 (two) times daily with a meal., Disp: 180 tablet, Rfl: 0 .  omeprazole (PRILOSEC) 20 MG capsule, Take 1 capsule (20 mg total) by mouth daily., Disp: 90 capsule, Rfl: 3 .  ONETOUCH VERIO test strip, USE AS INSTRUCTED TO CHECK BLOOD SUGAR THREE TIMES A DAY, Disp: 100 each, Rfl: 3 .  VICTOZA 18 MG/3ML SOPN, Inject 0.3 mLs (1.8 mg total) into the skin daily. Inject once daily at the same time, Disp: 3 pen, Rfl: 3  EXAM:  Vitals:   08/28/16 1110  BP: 126/70  Pulse: 86    Body mass index is 40.11 kg/m.  GENERAL: vitals  reviewed and listed above, alert, oriented, appears well hydrated and in no acute distress  HEENT: atraumatic, conjunttiva clear, no obvious abnormalities on inspection of external nose and ears  NECK: no obvious masses on inspection  LUNGS: clear to auscultation bilaterally, no wheezes, rales or rhonchi, good air movement  CV: HRRR,tr peripheral edema ankles bilat  MS: moves all extremities without noticeable abnormality  PSYCH: pleasant and cooperative, no obvious depression or anxiety  ASSESSMENT AND PLAN:  Discussed the following assessment and plan:  Hypertension associated with diabetes ([place])  Hyperlipidemia, unspecified hyperlipidemia type  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  Patient Instructions  BEFORE YOU LEAVE: -follow up: 3 months -lab for hep c check  Consider adding baby aspirin  We recommend the following healthy lifestyle for LIFE: 1) Small portions.   Tip: eat off of a salad plate instead of a dinner plate.  Tip: It is ok to feel hungry after a meal of proper portion size - check portion size for all snacks and foods with nutrition lable  Tip: if you need more or a snack choose fruits, veggies and/or a handful of nuts or seeds.  2) Eat a healthy clean diet.  * Tip: Avoid (less then 1 serving per week): processed foods, sweets, sweetened drinks, white starches (rice, flour, bread, potatoes, pasta, etc), red meat, fast foods, butter  *Tip: CHOOSE instead   * 5-9 servings per day of fresh or frozen fruits and vegetables (but not corn, potatoes, bananas, canned or dried fruit)   *nuts and seeds, beans   *olives and olive oil   *small portions of lean meats such as fish and white chicken    *small portions of whole grains  3)Get at least 150 minutes of sweaty aerobic exercise per week.  4)Reduce stress - consider counseling, meditation and relaxation to balance other aspects of your life.     Colin Benton R., DO

## 2016-08-27 NOTE — Progress Notes (Signed)
Subjective:   Matthew Barnes is a 61 y.o. male who presents for an Initial Medicare Annual Wellness Visit.  The Patient was informed that the wellness visit is to identify future health risk and educate and initiate measures that can reduce risk for increased disease through the lifespan.    NO ROS; Medicare Wellness Visit Seeing Dr. Maudie Mercury at 11am Hx DM   Describes health as good, fair or great? Good  He was very discouraged not to see weight loss as he has cut out his snakes; Educated on diet; sugar and sodium   Preventive Screening -Counseling & Management  PSA 08/2014  Smoking history - never smoked  Smokeless tobacco no Second Hand Smoke status; No Smokers in the home ETOH - no  Medication adherence or issues? no  RISK FACTORS Diet  Weight is 304 Expressed frustration and disappointed; reviewed his diet;  Eating crackers in lieu of his snacks, explained serving levels as he was eating a "sleeve"  Has been diabetic approx 8 years  Is working with Vaughan Basta S. on diet at Dr. Ronnie Derby office Is seeing a drop in his sugar; his reading are 79 and 129 Discussed A1c; and discussed as well as food choices   Regular exercise very limited  Has an elliptical at home  Encouraged to use this 5 days; agreed to 10 min Educated that biggest impact would come with 30 minutes 5 times a week; agreed that he may attempt 15 minute intervals x 2  Cardiac Risk Factors:  Advanced aged > 57 in men;  Hyperlipidemia - chol 94; HDL 23; LDL 43 and Trig 139 Diabetes - glucose 211; A1c 8.0  Stated his BS in the am have dropped  Family History  - DM, HTN, HD , MI Obesity working on weight loss   Fall risk  Given education on "Fall Prevention in the Home" for more safety tips the patient can apply as appropriate.  Long term goal is to "age in place" or undecided   Mobility of Functional changes this year? no;   Mental Health: Has mental health plan with bipolar and he is doing well at  present. Understands his triggers   Hearing Screening Comments: No hearing issues  Vision Screening Comments: Last vision exam was last week Dr. Laurence Aly at [facility]    Activities of Daily Living - See functional screen   Cognitive testing; Ad8 score; 0 or less than 2  MMSE deferred or completed if AD8 + 2 issues  Hearing Screening Comments: No hearing issues  Vision Screening Comments: Last vision exam was last week Dr. Laurence Aly at [facility]   Patient Care Team: Lucretia Kern, DO as PCP - General (Family Medicine) Adegoroye, Wynona Luna, MD (Specialist) Dr. [facility]rence [facility]ly at [facility] care  Dr. Dwyane Dee - endo           Objective:    Today's Vitals   08/28/16 1052  BP: 126/70  Pulse: 86  SpO2: 92%  Weight: (!) 304 lb (137.9 kg)  Height: 6\' 1"  (1.854 m)   Body mass index is 40.11 kg/m.   Current Medications (verified) Outpatient Encounter Prescriptions as of 08/28/2016  Medication Sig  . amLODipine (NORVASC) 10 MG tablet TAKE 1 TABLET (10 MG TOTAL) BY MOUTH DAILY.  . EQ LORATADINE 10 MG tablet TAKE ONE TABLET BY MOUTH ONCE DAILY.  Marland Kitchen gabapentin (NEURONTIN) 300 MG capsule TAKE 1 CAPSULE (300 MG TOTAL) BY MOUTH 3 (THREE) TIMES DAILY.  Marland Kitchen  hydrochlorothiazide (HYDRODIURIL) 12.5 MG tablet Take 1 tablet (12.5 mg total) by mouth daily.  . insulin NPH-regular Human (NOVOLIN 70/30 RELION) (70-30) 100 UNIT/ML injection INJECT 50 UNITS UNDER THE SKIN 3 TIMES DAILY AS DIRECTED (Patient taking differently: INJECT 40 UNITS UNDER THE SKIN 3 TIMES DAILY AS DIRECTED)  . Insulin Syringe-Needle U-100 (INSULIN SYRINGE 1CC/31GX5/16") 31G X 5/16" 1 ML MISC Use 3 needles per day  . Lancets (ACCU-CHEK MULTICLIX) lancets Use as instructed  . losartan (COZAAR) 100 MG tablet Take 1 tablet (100 mg total) by mouth daily.  Marland Kitchen lovastatin (MEVACOR) 20 MG tablet Take 1 tablet (20 mg total) by mouth at bedtime.  . metFORMIN (GLUCOPHAGE) 1000 MG tablet Take 1 tablet (1,000 mg total) by mouth 2  (two) times daily with a meal.  . omeprazole (PRILOSEC) 20 MG capsule Take 1 capsule (20 mg total) by mouth daily.  Glory Rosebush VERIO test strip USE AS INSTRUCTED TO CHECK BLOOD SUGAR THREE TIMES A DAY  . VICTOZA 18 MG/3ML SOPN Inject 0.3 mLs (1.8 mg total) into the skin daily. Inject once daily at the same time   No facility-administered encounter medications on file as of 08/28/2016.     Allergies (verified) Patient has no known allergies.   History: Past Medical History:  Diagnosis Date  . Bipolar II disorder - Managed by Marye Round [phone]) 05/03/2013  . Diabetes mellitus   . Hyperlipidemia   . Hypertension   . Unspecified hypothyroidism 05/03/2013   Past Surgical History:  Procedure Laterality Date  . KNEE SURGERY     Family History  Problem Relation Age of Onset  . Diabetes Mother   . Hypertension Mother   . Heart disease Father   . Heart attack Father 31   Social History   Occupational History  . Not on file.   Social History Main Topics  . Smoking status: Never Smoker  . Smokeless tobacco: Never Used  . Alcohol use No  . Drug use: No  . Sexual activity: No    Tobacco Counseling Counseling given: Yes   Activities of Daily Living In your present state of health, do you have any difficulty performing the following activities: 08/28/2016  Hearing? N  Vision? N  Difficulty concentrating or making decisions? N  Walking or climbing stairs? N  Dressing or bathing? N  Doing errands, shopping? N  Preparing Food and eating ? N  Using the Toilet? N  In the past six months, have you accidently leaked urine? N  Do you have problems with loss of bowel control? N  Managing your Medications? N  Managing your Finances? N  Housekeeping or managing your Housekeeping? N  Some recent data might be hidden    Immunizations and Health Maintenance Immunization History  Administered Date(s) Administered  . Influenza,inj,Quad PF,36+ Mos 01/17/2014  .  Pneumococcal Polysaccharide-23 01/17/2014   There are no preventive care reminders to display for this patient.  Patient Care Team: Lucretia Kern, DO as PCP - General (Family Medicine) Adegoroye, Wynona Luna, MD (Specialist)  Indicate any recent Medical Services you may have received from other than [facility] providers in the past year (date may be approximate).     Assessment:   This is a routine wellness examination for Matthew Barnes.   Hearing/Vision screen Hearing Screening Comments: No hearing issues  Vision Screening Comments: Last vision exam was last week Dr. Laurence Aly at [facility]  Dietary issues and exercise activities discussed: Current Exercise Habits: Home exercise routine  Goals    .  Exercise 150 minutes per week (moderate activity)          Try the elliptical x 5 days a week and the goal is 30 minutes a day x 5     . Weight (lb) < 250 lb (113.4 kg)          Check out  online nutrition programs as GumSearch.nl and http://vang.com/;   There is a lot of other apps you may enjoy; "lose it".  You can track you food with calorieking.com  Check your sodium levels and calories per day with these apps until you find a combination that works for you   Look for foods with "whole" wheat; bran; oatmeal etc Shot at the farmer's markets in season for fresher choices  Watch for "hydrogenated" on the label of oils which are trans-fats.  Watch for "high fructose corn syrup" in snacks, yogurt or ketchup  Meats have less marbling; bright colored fruits and vegetables;  Canned; dump out liquid and wash vegetables. Be mindful of what we are eating  Portion control is essential to a health weight! Sit down; take a break and enjoy your meal; take smaller bites; put the fork down between bites;  It takes 20 minutes to get full; so check in with your fullness cues and stop eating when you start to fill full             Depression Screen PHQ 2/9 Scores 08/28/2016 05/17/2014  PHQ -  2 Score 0 0    Fall Risk Fall Risk  08/28/2016 05/17/2014  Falls in the past year? No No    Cognitive Function: normal at present Continues to manage meds and manage his life         Screening Tests Health Maintenance  Topic Date Due  . OPHTHALMOLOGY EXAM  08/28/2017 (Originally 08/17/2015)  . COLONOSCOPY  05/03/2018 (Originally 03/09/2006)  . HIV Screening  09/03/2024 (Originally 03/10/1971)  . INFLUENZA VACCINE  01/21/2026 (Originally 10/29/2016)  . HEMOGLOBIN A1C  02/01/2017  . FOOT EXAM  04/29/2017  . PNEUMOCOCCAL POLYSACCHARIDE VACCINE (2) 01/18/2019  . TETANUS/TDAP  05/17/2024  . Hepatitis C Screening  Completed      Plan:      PCP Notes  Health Maintenance  Had his eye exam last week; will call and get report or confirm no diabetic retinopathy.   Dr. Maudie Mercury to order Hep C  Abnormal Screens: none  Referrals no  Patient concerns; losing weight  Nurse Concerns; 1. Educated regarding diet; encouraged him to keep a record of his food intake; am bs, and weights; a week at a time; now and again prior to going to endo; Advised to check BS a couple of times a week prior to his largest meal and 2 hours post meal 2. Educated regarding weight gain with sodium; eats out a lot;  Discussed options with food choices; states he has no preparation time; discussed planning on a weekend; choosing foods that are good for snacks. Discussed app resources as myfitnesspal, lose it or calorieking.com, so he can track the sodium and carb level in foods when he is eating out.   Encouraged to make one good choice per day that is "do'able" and stick to it.  FBS have been good in the am. 80 and 120    Next PCP apt tbs   I have personally reviewed and noted the following in the patient's chart:   . Medical and social history . Use of alcohol, tobacco or illicit drugs  .  Current medications and supplements . Functional ability and status . Nutritional status . Physical  activity . Advanced directives . List of other physicians . Hospitalizations, surgeries, and ER visits in previous 12 months . Vitals . Screenings to include cognitive, depression, and falls . Referrals and appointments  In addition, I have reviewed and discussed with patient certain preventive protocols, quality metrics, and best practice recommendations. A written personalized care plan for preventive services as well as general preventive health recommendations were provided to patient.     Wynetta Fines, RN   08/28/2016

## 2016-08-28 ENCOUNTER — Ambulatory Visit (INDEPENDENT_AMBULATORY_CARE_PROVIDER_SITE_OTHER): Payer: Medicare HMO | Admitting: Family Medicine

## 2016-08-28 ENCOUNTER — Ambulatory Visit (INDEPENDENT_AMBULATORY_CARE_PROVIDER_SITE_OTHER): Payer: Medicare HMO

## 2016-08-28 ENCOUNTER — Telehealth: Payer: Self-pay

## 2016-08-28 ENCOUNTER — Encounter: Payer: Self-pay | Admitting: Family Medicine

## 2016-08-28 VITALS — BP 126/70 | HR 86 | Ht 73.0 in | Wt 304.0 lb

## 2016-08-28 DIAGNOSIS — E1142 Type 2 diabetes mellitus with diabetic polyneuropathy: Secondary | ICD-10-CM

## 2016-08-28 DIAGNOSIS — I152 Hypertension secondary to endocrine disorders: Secondary | ICD-10-CM

## 2016-08-28 DIAGNOSIS — E785 Hyperlipidemia, unspecified: Secondary | ICD-10-CM | POA: Diagnosis not present

## 2016-08-28 DIAGNOSIS — Z0000 Encounter for general adult medical examination without abnormal findings: Secondary | ICD-10-CM | POA: Diagnosis not present

## 2016-08-28 DIAGNOSIS — Z6841 Body Mass Index (BMI) 40.0 and over, adult: Secondary | ICD-10-CM | POA: Diagnosis not present

## 2016-08-28 DIAGNOSIS — Z7289 Other problems related to lifestyle: Secondary | ICD-10-CM | POA: Diagnosis not present

## 2016-08-28 DIAGNOSIS — E1159 Type 2 diabetes mellitus with other circulatory complications: Secondary | ICD-10-CM

## 2016-08-28 DIAGNOSIS — I10 Essential (primary) hypertension: Secondary | ICD-10-CM | POA: Diagnosis not present

## 2016-08-28 LAB — HEPATITIS C ANTIBODY: HCV AB: NEGATIVE

## 2016-08-28 NOTE — Progress Notes (Signed)
KIM, HANNAH R., DO  

## 2016-08-28 NOTE — Patient Instructions (Addendum)
[name] , Thank you for taking time to come for your Medicare Wellness Visit. I appreciate your ongoing commitment to your health goals. Please review the following plan we discussed and let me know if I can assist you in the future.   Please keep a journal of your calories, exercise and blood sugars in the am; check them a few times prior to your largest meal and 2 hours after your largest meal;   Try to watch your sodium;   Have some go to snacks!   Matthew Barnes to call and get your eye report; will confirm the date   These are the goals we discussed: Goals    . Exercise 150 minutes per week (moderate activity)          Try the elliptical x 5 days a week and the goal is 30 minutes a day x 5     . Weight (lb) < 250 lb (113.4 kg)          Check out  online nutrition programs as GumSearch.nl and http://vang.com/;   There is a lot of other apps you may enjoy; "lose it".  You can track you food with calorieking.com  Check your sodium levels and calories per day with these apps until you find a combination that works for you   Look for foods with "whole" wheat; bran; oatmeal etc Shot at the farmer's markets in season for fresher choices  Watch for "hydrogenated" on the label of oils which are trans-fats.  Watch for "high fructose corn syrup" in snacks, yogurt or ketchup  Meats have less marbling; bright colored fruits and vegetables;  Canned; dump out liquid and wash vegetables. Be mindful of what we are eating  Portion control is essential to a health weight! Sit down; take a break and enjoy your meal; take smaller bites; put the fork down between bites;  It takes 20 minutes to get full; so check in with your fullness cues and stop eating when you start to fill full              This is a list of the screening recommended for you and due dates:  Health Maintenance  Topic Date Due  .  Hepatitis C: One time screening is recommended by Center for Disease Control  (CDC) for   adults born from [birth year] through 1965.   September 06, 1955  . Eye exam for diabetics  08/17/2015  . Colon Cancer Screening  05/03/2018*  . HIV Screening  09/03/2024*  . Flu Shot  01/21/2026*  . Hemoglobin A1C  02/01/2017  . Complete foot exam   04/29/2017  . Pneumococcal vaccine (2) 01/18/2019  . Tetanus Vaccine  05/17/2024  *Topic was postponed. The date shown is not the original due date.      DASH Eating Plan DASH stands for "Dietary Approaches to Stop Hypertension." The DASH eating plan is a healthy eating plan that has been shown to reduce high blood pressure (hypertension). It may also reduce your risk for type 2 diabetes, heart disease, and stroke. The DASH eating plan may also help with weight loss. What are tips for following this plan? General guidelines  Avoid eating more than 2,300 mg (milligrams) of salt (sodium) a day. If you have hypertension, you may need to reduce your sodium intake to 1,500 mg a day.  Limit alcohol intake to no more than 1 drink a day for nonpregnant women and 2 drinks a day for men. One drink equals 12 oz of  beer, 5 oz of wine, or 1 oz of hard liquor.  Work with your health care provider to maintain a healthy body weight or to lose weight. Ask what an ideal weight is for you.  Get at least 30 minutes of exercise that causes your heart to beat faster (aerobic exercise) most days of the week. Activities may include walking, swimming, or biking.  Work with your health care provider or diet and nutrition specialist (dietitian) to adjust your eating plan to your individual calorie needs. Reading food labels  Check food labels for the amount of sodium per serving. Choose foods with less than 5 percent of the Daily Value of sodium. Generally, foods with less than 300 mg of sodium per serving fit into this eating plan.  To find whole grains, look for the word "whole" as the first word in the ingredient list. Shopping  Buy products labeled as "low-sodium" or "no  salt added."  Buy fresh foods. Avoid canned foods and premade or frozen meals. Cooking  Avoid adding salt when cooking. Use salt-free seasonings or herbs instead of table salt or sea salt. Check with your health care provider or pharmacist before using salt substitutes.  Do not fry foods. Cook foods using healthy methods such as baking, boiling, grilling, and broiling instead.  Cook with heart-healthy oils, such as olive, canola, soybean, or sunflower oil. Meal planning   Eat a balanced diet that includes: ? 5 or more servings of fruits and vegetables each day. At each meal, try to fill half of your plate with fruits and vegetables. ? Up to 6-8 servings of whole grains each day. ? Less than 6 oz of lean meat, poultry, or fish each day. A 3-oz serving of meat is about the same size as a deck of cards. One egg equals 1 oz. ? 2 servings of low-fat dairy each day. ? A serving of nuts, seeds, or beans 5 times each week. ? Heart-healthy fats. Healthy fats called Omega-3 fatty acids are found in foods such as flaxseeds and coldwater fish, like sardines, salmon, and mackerel.  Limit how much you eat of the following: ? Canned or prepackaged foods. ? Food that is high in trans fat, such as fried foods. ? Food that is high in saturated fat, such as fatty meat. ? Sweets, desserts, sugary drinks, and other foods with added sugar. ? Full-fat dairy products.  Do not salt foods before eating.  Try to eat at least 2 vegetarian meals each week.  Eat more home-cooked food and less restaurant, buffet, and fast food.  When eating at a restaurant, ask that your food be prepared with less salt or no salt, if possible. What foods are recommended? The items listed may not be a complete list. Talk with your dietitian about what dietary choices are best for you. Grains Whole-grain or whole-wheat bread. Whole-grain or whole-wheat pasta. Brown rice. [place] Morrow. Bulgur. Whole-grain and low-sodium  cereals. Pita bread. Low-fat, low-sodium crackers. Whole-wheat flour tortillas. Vegetables Fresh or frozen vegetables (raw, steamed, roasted, or grilled). Low-sodium or reduced-sodium tomato and vegetable juice. Low-sodium or reduced-sodium tomato sauce and tomato paste. Low-sodium or reduced-sodium canned vegetables. Fruits All fresh, dried, or frozen fruit. Canned fruit in natural juice (without added sugar). Meat and other protein foods Skinless chicken or [place]. Ground chicken or [place]. Pork with fat trimmed off. Fish and seafood. Egg whites. Dried beans, peas, or lentils. Unsalted nuts, nut butters, and seeds. Unsalted canned beans. Lean cuts of beef with fat  trimmed off. Low-sodium, lean deli meat. Dairy Low-fat (1%) or fat-free (skim) milk. Fat-free, low-fat, or reduced-fat cheeses. Nonfat, low-sodium ricotta or cottage cheese. Low-fat or nonfat yogurt. Low-fat, low-sodium cheese. Fats and oils Soft margarine without trans fats. Vegetable oil. Low-fat, reduced-fat, or light mayonnaise and salad dressings (reduced-sodium). Canola, safflower, olive, soybean, and sunflower oils. Avocado. Seasoning and other foods Herbs. Spices. Seasoning mixes without salt. Unsalted popcorn and pretzels. Fat-free sweets. What foods are not recommended? The items listed may not be a complete list. Talk with your dietitian about what dietary choices are best for you. Grains Baked goods made with fat, such as croissants, muffins, or some breads. Dry pasta or rice meal packs. Vegetables Creamed or fried vegetables. Vegetables in a cheese sauce. Regular canned vegetables (not low-sodium or reduced-sodium). Regular canned tomato sauce and paste (not low-sodium or reduced-sodium). Regular tomato and vegetable juice (not low-sodium or reduced-sodium). Angie Fava. Olives. Fruits Canned fruit in a light or heavy syrup. Fried fruit. Fruit in cream or butter sauce. Meat and other protein foods Fatty cuts of meat. Ribs.  Fried meat. Berniece Salines. Sausage. Bologna and other processed lunch meats. Salami. Fatback. Hotdogs. Bratwurst. Salted nuts and seeds. Canned beans with added salt. Canned or smoked fish. Whole eggs or egg yolks. Chicken or [place] with skin. Dairy Whole or 2% milk, cream, and half-and-half. Whole or full-fat cream cheese. Whole-fat or sweetened yogurt. Full-fat cheese. Nondairy creamers. Whipped toppings. Processed cheese and cheese spreads. Fats and oils Butter. Stick margarine. Lard. Shortening. Ghee. Bacon fat. Tropical oils, such as coconut, palm kernel, or palm oil. Seasoning and other foods Salted popcorn and pretzels. Onion salt, garlic salt, seasoned salt, table salt, and sea salt. Worcestershire sauce. Tartar sauce. Barbecue sauce. Teriyaki sauce. Soy sauce, including reduced-sodium. Steak sauce. Canned and packaged gravies. Fish sauce. Oyster sauce. Cocktail sauce. Horseradish that you find on the shelf. Ketchup. Mustard. Meat flavorings and tenderizers. Bouillon cubes. Hot sauce and Tabasco sauce. Premade or packaged marinades. Premade or packaged taco seasonings. Relishes. Regular salad dressings. Where to find more information:  National Heart, Lung, and [place]: https://wilson-eaton.com/  American Heart Association: www.heart.org Summary  The DASH eating plan is a healthy eating plan that has been shown to reduce high blood pressure (hypertension). It may also reduce your risk for type 2 diabetes, heart disease, and stroke.  With the DASH eating plan, you should limit salt (sodium) intake to 2,300 mg a day. If you have hypertension, you may need to reduce your sodium intake to 1,500 mg a day.  When on the DASH eating plan, aim to eat more fresh fruits and vegetables, whole grains, lean proteins, low-fat dairy, and heart-healthy fats.  Work with your health care provider or diet and nutrition specialist (dietitian) to adjust your eating plan to your individual calorie needs. This  information is not intended to replace advice given to you by your health care provider. Make sure you discuss any questions you have with your health care provider. Document Released: 03/06/2011 Document Revised: 03/10/2016 Document Reviewed: 03/10/2016 Elsevier Interactive Patient Education  2017 [employer].

## 2016-08-28 NOTE — Patient Instructions (Addendum)
BEFORE YOU LEAVE: -follow up: 3 months -lab for hep c check  Consider adding baby aspirin  We recommend the following healthy lifestyle for LIFE: 1) Small portions.   Tip: eat off of a salad plate instead of a dinner plate.  Tip: It is ok to feel hungry after a meal of proper portion size - check portion size for all snacks and foods with nutrition lable  Tip: if you need more or a snack choose fruits, veggies and/or a handful of nuts or seeds.  2) Eat a healthy clean diet.  * Tip: Avoid (less then 1 serving per week): processed foods, sweets, sweetened drinks, white starches (rice, flour, bread, potatoes, pasta, etc), red meat, fast foods, butter  *Tip: CHOOSE instead   * 5-9 servings per day of fresh or frozen fruits and vegetables (but not corn, potatoes, bananas, canned or dried fruit)   *nuts and seeds, beans   *olives and olive oil   *small portions of lean meats such as fish and white chicken    *small portions of whole grains  3)Get at least 150 minutes of sweaty aerobic exercise per week.  4)Reduce stress - consider counseling, meditation and relaxation to balance other aspects of your life.

## 2016-08-28 NOTE — Telephone Encounter (Signed)
Call to [facility] , Dr. Laurence Aly [phone] and they will fax his last eye exam with md notes

## 2016-08-29 ENCOUNTER — Other Ambulatory Visit: Payer: Self-pay | Admitting: Family Medicine

## 2016-09-01 ENCOUNTER — Encounter: Payer: Self-pay | Admitting: Family Medicine

## 2016-09-05 NOTE — Telephone Encounter (Signed)
Removed postpone for opthalmology, colonoscopy, flu and HIV.  Metrics are not met without having documentation.

## 2016-09-09 ENCOUNTER — Other Ambulatory Visit: Payer: Self-pay | Admitting: Endocrinology

## 2016-09-24 DIAGNOSIS — R69 Illness, unspecified: Secondary | ICD-10-CM | POA: Diagnosis not present

## 2016-10-19 ENCOUNTER — Other Ambulatory Visit: Payer: Self-pay | Admitting: Family Medicine

## 2016-10-27 ENCOUNTER — Encounter: Payer: Self-pay | Admitting: Family Medicine

## 2016-10-27 ENCOUNTER — Ambulatory Visit (INDEPENDENT_AMBULATORY_CARE_PROVIDER_SITE_OTHER): Payer: Medicare HMO | Admitting: Family Medicine

## 2016-10-27 VITALS — BP 122/80 | HR 96 | Temp 98.1°F | Ht 73.0 in | Wt 305.1 lb

## 2016-10-27 DIAGNOSIS — E785 Hyperlipidemia, unspecified: Secondary | ICD-10-CM

## 2016-10-27 DIAGNOSIS — E1142 Type 2 diabetes mellitus with diabetic polyneuropathy: Secondary | ICD-10-CM

## 2016-10-27 DIAGNOSIS — E1159 Type 2 diabetes mellitus with other circulatory complications: Secondary | ICD-10-CM

## 2016-10-27 DIAGNOSIS — I10 Essential (primary) hypertension: Secondary | ICD-10-CM | POA: Diagnosis not present

## 2016-10-27 DIAGNOSIS — I152 Hypertension secondary to endocrine disorders: Secondary | ICD-10-CM

## 2016-10-27 DIAGNOSIS — G5603 Carpal tunnel syndrome, bilateral upper limbs: Secondary | ICD-10-CM | POA: Diagnosis not present

## 2016-10-27 DIAGNOSIS — R600 Localized edema: Secondary | ICD-10-CM | POA: Diagnosis not present

## 2016-10-27 MED ORDER — HYDROCHLOROTHIAZIDE 25 MG PO TABS: 25.0000 mg | ORAL_TABLET | Freq: Every day | ORAL | 3 refills | Status: DC

## 2016-10-27 NOTE — Patient Instructions (Signed)
BEFORE YOU LEAVE: -order cologuard -follow up: 3-4 months  We ordered the Cologuard test for colon cancer screening. Please complete this test promptly once the kit arrives. Please contact us if you have not received your kit in the next few weeks.   Get CBC and BMP when you have your labs drawn next week.  Try cock up wrist supports at night.  Compression socks daily.   Increase hctz to 67m daily.   We recommend the following healthy lifestyle for LIFE: 1) Small portions.   Tip: eat off of a salad plate instead of a dinner plate.  Tip: It is ok to feel hungry after a meal - that likely means you ate an appropriate portion.  Tip: if you need more or a snack choose fruits, veggies and/or a handful of nuts or seeds.  2) Eat a healthy clean diet.   TRY TO EAT: -at least 5-7 servings of low sugar vegetables per day (not corn, potatoes or bananas.) -berries are the best choice if you wish to eat fruit.   -lean meets (fish, chicken or t[place]breasts) -vegan proteins for some meals - beans or tofu, whole grains, nuts and seeds -Replace bad fats with good fats - good fats include: fish, nuts and seeds, canola oil, olive oil -small amounts of low fat or non fat dairy -small amounts of100 % whole grains - check the lables  AVOID: -SUGAR, sweets, anything with added sugar, corn syrup or sweeteners -if you must have a sweetener, small amounts of stevia may be best -sweetened beverages -simple starches (rice, bread, potatoes, pasta, chips, etc - small amounts of 100% whole grains are ok) -red meat, pork, butter -fried foods, fast food, processed food, excessive dairy, eggs and coconut.  3)Get at least 150 minutes of sweaty aerobic exercise per week.  4)Reduce stress - consider counseling, meditation and relaxation to balance other aspects of your life.

## 2016-10-27 NOTE — Progress Notes (Signed)
HPI:  Follow up HTN, LE edema, Obesity, DM (sees endo), bipolar disorder (sees psychiatry). Doing well. Reports eating healthier and improved blood sugars 80-190s on random checks. Dietary indiscretion results in highs. No regular exercise. 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 Still has some LE edema at times but diuretic helping. No CP, SOb, DOE, palpitations, decreased energy. Had mild anemia when went to donate blood.    ROS: See pertinent positives and negatives per HPI.  Past Medical History:  Diagnosis Date  . Bipolar II disorder - Managed by Marye Round [phone]) 05/03/2013  . Diabetes mellitus   . Hyperlipidemia   . Hypertension   . Unspecified hypothyroidism 05/03/2013    Past Surgical History:  Procedure Laterality Date  . KNEE SURGERY      Family History  Problem Relation Age of Onset  . Diabetes Mother   . Hypertension Mother   . Heart disease Father   . Heart attack Father 60    Social History   Social History  . Marital status: Single    Spouse name: N/A  . Number of children: N/A  . Years of education: N/A   Social History Main Topics  . Smoking status: Never Smoker  . Smokeless tobacco: Never Used  . Alcohol use No  . Drug use: No  . Sexual activity: No   Other Topics Concern  . None   Social History Narrative   Work or School: on disability for knee arthritis      Home Situation: lives alone      Spiritual Beliefs:Christian      Lifestyle:no regular exercising; diet not great              Current Outpatient Prescriptions:  .  amLODipine (NORVASC) 10 MG tablet, TAKE 1 TABLET (10 MG TOTAL) BY MOUTH DAILY., Disp: 30 tablet, Rfl: 2 .  gabapentin (NEURONTIN) 300 MG capsule, TAKE 1 CAPSULE (300 MG TOTAL) BY MOUTH 3 (THREE) TIMES DAILY., Disp: 90 capsule, Rfl: 3 .  hydrochlorothiazide (HYDRODIURIL) 25 MG tablet, Take 1 tablet (25 mg total) by mouth daily., Disp: 90 tablet, Rfl: 3 .  Insulin Syringe-Needle U-100 (INSULIN SYRINGE  1CC/31GX5/16") 31G X 5/16" 1 ML MISC, Use 3 needles per day, Disp: 100 each, Rfl: 3 .  Lancets (ACCU-CHEK MULTICLIX) lancets, Use as instructed, Disp: 100 each, Rfl: 12 .  loratadine (CLARITIN) 10 MG tablet, TAKE 1 TABLET EVERY DAY. **NOT COVERED UNDER INSURANCE, Disp: 90 tablet, Rfl: 1 .  losartan (COZAAR) 100 MG tablet, Take 1 tablet (100 mg total) by mouth daily., Disp: 90 tablet, Rfl: 3 .  lovastatin (MEVACOR) 20 MG tablet, Take 1 tablet (20 mg total) by mouth at bedtime., Disp: 90 tablet, Rfl: 3 .  NOVOLIN 70/30 (70-30) 100 UNIT/ML injection, INJECT 50 UNITS UNDER THE SKIN 3 TIMES DAILY AS DIRECTED, Disp: 40 mL, Rfl: 3 .  omeprazole (PRILOSEC) 20 MG capsule, Take 1 capsule (20 mg total) by mouth daily., Disp: 90 capsule, Rfl: 3 .  ONETOUCH VERIO test strip, USE AS INSTRUCTED TO CHECK BLOOD SUGAR THREE TIMES A DAY, Disp: 100 each, Rfl: 3  EXAM:  Vitals:   10/27/16 1551  BP: 122/80  Pulse: 96  Temp: 98.1 F (36.7 C)    Body mass index is 40.25 kg/m.  GENERAL: vitals reviewed and listed above, alert, oriented, appears well hydrated and in no acute distress  HEENT: atraumatic, conjunttiva clear, no obvious abnormalities on inspection of external nose and ears  NECK: no obvious masses on inspection  LUNGS: clear to auscultation bilaterally, no wheezes, rales or rhonchi, good air movement  CV: HRRR, no peripheral edema  MS: moves all extremities without noticeable abnormality  PSYCH: pleasant and cooperative, no obvious depression or anxiety  ASSESSMENT AND PLAN:  Discussed the following assessment and plan:  Hypertension associated with diabetes ([place]) - Plan: Basic metabolic panel, CBC  Hyperlipidemia, unspecified hyperlipidemia type  DM type 2 with diabetic peripheral neuropathy ([facility])  Leg edema  Bilateral carpal tunnel syndrome  -he wants to try increasing hctz for the LE edema -due for bmp and cbc (for reported anemia) - asks to draw these with endo labs next  week - orders placed -lifestyle recs -compression may also help -discussed cock up brace for CTS - seeing specialist -advised colon cancer screening - he agrees to cologuard - advised assistant to order -Patient advised to return or notify a doctor immediately if symptoms worsen or persist or new concerns arise.  Patient Instructions  BEFORE YOU LEAVE: -order cologuard -follow up: 3-4 months  We ordered the Cologuard test for colon cancer screening. Please complete this test promptly once the kit arrives. Please contact us if you have not received your kit in the next few weeks.   Get CBC and BMP when you have your labs drawn next week.  Try cock up wrist supports at night.  Compression socks daily.   Increase hctz to 7m daily.   We recommend the following healthy lifestyle for LIFE: 1) Small portions.   Tip: eat off of a salad plate instead of a dinner plate.  Tip: It is ok to feel hungry after a meal - that likely means you ate an appropriate portion.  Tip: if you need more or a snack choose fruits, veggies and/or a handful of nuts or seeds.  2) Eat a healthy clean diet.   TRY TO EAT: -at least 5-7 servings of low sugar vegetables per day (not corn, potatoes or bananas.) -berries are the best choice if you wish to eat fruit.   -lean meets (fish, chicken or t[place]breasts) -vegan proteins for some meals - beans or tofu, whole grains, nuts and seeds -Replace bad fats with good fats - good fats include: fish, nuts and seeds, canola oil, olive oil -small amounts of low fat or non fat dairy -small amounts of100 % whole grains - check the lables  AVOID: -SUGAR, sweets, anything with added sugar, corn syrup or sweeteners -if you must have a sweetener, small amounts of stevia may be best -sweetened beverages -simple starches (rice, bread, potatoes, pasta, chips, etc - small amounts of 100% whole grains are ok) -red meat, pork, butter -fried foods, fast food, processed food,  excessive dairy, eggs and coconut.  3)Get at least 150 minutes of sweaty aerobic exercise per week.  4)Reduce stress - consider counseling, meditation and relaxation to balance other aspects of your life.     KColin BentonR., DO

## 2016-10-29 ENCOUNTER — Other Ambulatory Visit: Payer: Self-pay | Admitting: Family Medicine

## 2016-10-29 ENCOUNTER — Other Ambulatory Visit: Payer: Self-pay | Admitting: Endocrinology

## 2016-10-30 DIAGNOSIS — R69 Illness, unspecified: Secondary | ICD-10-CM | POA: Diagnosis not present

## 2016-10-31 ENCOUNTER — Other Ambulatory Visit: Payer: Self-pay | Admitting: Endocrinology

## 2016-11-03 ENCOUNTER — Other Ambulatory Visit (INDEPENDENT_AMBULATORY_CARE_PROVIDER_SITE_OTHER): Payer: Medicare HMO

## 2016-11-03 DIAGNOSIS — Z794 Long term (current) use of insulin: Secondary | ICD-10-CM

## 2016-11-03 DIAGNOSIS — E1165 Type 2 diabetes mellitus with hyperglycemia: Secondary | ICD-10-CM | POA: Diagnosis not present

## 2016-11-03 LAB — HEMOGLOBIN A1C: HEMOGLOBIN A1C: 6.8 % — AB (ref 4.6–6.5)

## 2016-11-03 LAB — BASIC METABOLIC PANEL
BUN: 10 mg/dL (ref 6–23)
CHLORIDE: 103 meq/L (ref 96–112)
CO2: 28 mEq/L (ref 19–32)
Calcium: 8.9 mg/dL (ref 8.4–10.5)
Creatinine, Ser: 1.14 mg/dL (ref 0.40–1.50)
GFR: 69.49 mL/min (ref >60.00)
Glucose, Bld: 197 mg/dL — ABNORMAL HIGH (ref 70–99)
POTASSIUM: 3.5 meq/L (ref 3.5–5.1)
Sodium: 139 mEq/L (ref 135–145)

## 2016-11-04 LAB — FRUCTOSAMINE: Fructosamine: 277 umol/L (ref 0–285)

## 2016-11-06 ENCOUNTER — Ambulatory Visit (INDEPENDENT_AMBULATORY_CARE_PROVIDER_SITE_OTHER): Payer: Medicare HMO | Admitting: Endocrinology

## 2016-11-06 ENCOUNTER — Encounter: Payer: Self-pay | Admitting: Endocrinology

## 2016-11-06 VITALS — BP 136/82 | HR 102 | Ht 73.0 in | Wt 301.6 lb

## 2016-11-06 DIAGNOSIS — E1165 Type 2 diabetes mellitus with hyperglycemia: Secondary | ICD-10-CM

## 2016-11-06 DIAGNOSIS — Z1211 Encounter for screening for malignant neoplasm of colon: Secondary | ICD-10-CM | POA: Diagnosis not present

## 2016-11-06 DIAGNOSIS — Z1212 Encounter for screening for malignant neoplasm of rectum: Secondary | ICD-10-CM | POA: Diagnosis not present

## 2016-11-06 LAB — COLOGUARD: COLOGUARD: NEGATIVE

## 2016-11-06 NOTE — Progress Notes (Signed)
Patient ID: Matthew Barnes, male   DOB: 09-May-1955, 61 y.o.   MRN: [id number]    Reason for Appointment: Followup for Type 2 Diabetes  Referring physician:  Maudie Mercury  History of Present Illness:          Diagnosis: Type 2 diabetes mellitus, date of diagnosis:   2011       Past history:  He was started on metformin at diagnosis and apparently did have fairly good control initially However about a year later because of poor control he was given insulin in addition. He had been taking Lantus insulin until about 2/15, up to 80 units a day However he does not think his blood sugars  Were controlled with this Because of higher A1c of 13.1% he was referred here for diabetes management in 6/15; was started on glucose monitoring and insulin was changed from low dose Levemir to Humalog mix 70 units a day He was also started on Victoza  to help with blood sugar control and facilitate weight loss on his initial consultation  Recent history:   INSULIN regimen is described as: Novolin mix 70/30, 50 units once or twice a day  A1c is significantly better at 6.8, previously 8%    Current blood sugar patterns, management of diabetes and problems:  His blood sugars are appearing better controlled compared to his last visit  He has been better with taking his insulin BEFORE his meals instead of after especially in the evenings; also consistent with taking it twice a day  He is however having sporadic high readings in the 200+ range especially after breakfast when he is sometimes eating pop tarts or biscuits  Generally not planning his meals well when he is not at home  He is not always checking sugars after his evening meal but these do not appear to be high except once  Overall blood sugars are better at home with recent average glucose 144  No hypoglycemia also  He still has not been able to exercise much for various reasons, also has some back pain and other issues  His weight is down  slightly  Appears to be more motivated to take care of his diabetes now  On his own he stopped metformin as he did not think it was helping his control   Oral hypoglycemic drugs the patient is taking are:  Not taking Metformin 1 g twice a day      Side effects from medications have been: None  Glucose monitoring:  done less than 1 times a day         Glucometer:  Verio recently, previously Accu-Chek  Blood Glucose readings from review of monitor as below  Mean values apply above for all meters except median for One Touch  PRE-MEAL Fasting Lunch Dinner Bedtime Overall  Glucose range:  87-210   76-1 67     Mean/median:     137   POST-MEAL PC Breakfast PC Lunch PC Dinner  Glucose range:  1 45-315   143-198   Mean/median:       Glycemic control:   Lab Results  Component Value Date   HGBA1C 6.8 (H) 11/03/2016   HGBA1C 8.0 (H) 08/01/2016   HGBA1C 8.2 (H) 04/01/2016   Lab Results  Component Value Date   MICROALBUR 4.2 (H) 04/04/2016   LDLCALC 43 08/01/2016   CREATININE 1.14 11/03/2016    Self-care: The diet that the patient has been following is: None, unable to control portions and carbs  when  depressed  Meals: 3 meals per day (dinner 6 pm-10 pm)          Exercise: Elliptical At home, only occasionally        Dietician visit: Most recent: never      CDE visit: 08/2013             Weight history: baseline 295  Wt Readings from Last 3 Encounters:  11/06/16 (!) 301 lb 9.6 oz (136.8 kg)  10/27/16 (!) 305 lb 1.6 oz (138.4 kg)  08/28/16 (!) 304 lb (137.9 kg)   Lab on 11/03/2016  Component Date Value Ref Range Status  . Fructosamine 11/03/2016 277  0 - 285 umol/L Final   Comment: Published reference interval for apparently healthy subjects between age 59 and 47 is 47 - 285 umol/L and in a poorly controlled diabetic population is 228 - 563 umol/L with a mean of 396 umol/L.   Marland Kitchen Sodium 11/03/2016 139  135 - 145 mEq/L Final  . Potassium 11/03/2016 3.5  3.5 - 5.1 mEq/L  Final  . Chloride 11/03/2016 103  96 - 112 mEq/L Final  . CO2 11/03/2016 28  19 - 32 mEq/L Final  . Glucose, Bld 11/03/2016 197* 70 - 99 mg/dL Final  . BUN 11/03/2016 10  6 - 23 mg/dL Final  . Creatinine, Ser 11/03/2016 1.14  0.40 - 1.50 mg/dL Final  . Calcium 11/03/2016 8.9  8.4 - 10.5 mg/dL Final  . GFR 11/03/2016 69.49  >60.00 mL/min Final  . Hgb A1c MFr Bld 11/03/2016 6.8* 4.6 - 6.5 % Final   Glycemic Control Guidelines for People with Diabetes:Non Diabetic:  <6%Goal of Therapy: <7%Additional Action Suggested:  >8%       Allergies as of 11/06/2016   No Known Allergies     Medication List       Accurate as of 11/06/16  1:46 PM. Always use your most recent med list.          amLODipine 10 MG tablet Commonly known as:  NORVASC TAKE 1 TABLET BY MOUTH EVERY DAY   gabapentin 300 MG capsule Commonly known as:  NEURONTIN TAKE 1 CAPSULE (300 MG TOTAL) BY MOUTH 3 (THREE) TIMES DAILY.   hydrochlorothiazide 25 MG tablet Commonly known as:  HYDRODIURIL Take 1 tablet (25 mg total) by mouth daily.   INSULIN SYRINGE 1CC/31GX5/16" 31G X 5/16" 1 ML Misc Use 3 needles per day   loratadine 10 MG tablet Commonly known as:  CLARITIN TAKE 1 TABLET EVERY DAY. **NOT COVERED UNDER INSURANCE   losartan 100 MG tablet Commonly known as:  COZAAR Take 1 tablet (100 mg total) by mouth daily.   lovastatin 20 MG tablet Commonly known as:  MEVACOR Take 1 tablet (20 mg total) by mouth at bedtime.   metFORMIN 1000 MG tablet Commonly known as:  GLUCOPHAGE TAKE 1 TABLET (1,000 MG TOTAL) BY MOUTH 2 (TWO) TIMES DAILY WITH A MEAL.   NOVOLIN 70/30 (70-30) 100 UNIT/ML injection Generic drug:  insulin NPH-regular Human INJECT 50 UNITS UNDER THE SKIN 3 TIMES DAILY AS DIRECTED   omeprazole 20 MG capsule Commonly known as:  PRILOSEC Take 1 capsule (20 mg total) by mouth daily.   ONETOUCH VERIO test strip Generic drug:  glucose blood USE AS INSTRUCTED TO CHECK BLOOD SUGAR THREE TIMES A DAY.        Allergies: No Known Allergies  Past Medical History:  Diagnosis Date  . Bipolar II disorder - Managed by Marye Round [phone]) 05/03/2013  . Diabetes mellitus   .  Hyperlipidemia   . Hypertension   . Unspecified hypothyroidism 05/03/2013    Past Surgical History:  Procedure Laterality Date  . KNEE SURGERY      Family History  Problem Relation Age of Onset  . Diabetes Mother   . Hypertension Mother   . Heart disease Father   . Heart attack Father 2    Social History:  reports that he has never smoked. He has never used smokeless tobacco. He reports that he does not drink alcohol or use drugs.    Review of Systems   HYPERTENSION:  Currently taking losartan 100 mg and Norvasc 10,    BP Readings from Last 3 Encounters:  11/06/16 136/82  10/27/16 122/80  08/28/16 126/70         LIPIDS: He has had marked increase in triglycerides previously.  Currently only on 20 mg lovastatin And also followed by PCP        Lab Results  Component Value Date   CHOL 94 08/01/2016   HDL 23.40 (L) 08/01/2016   LDLCALC 43 08/01/2016   LDLDIRECT 43.0 07/24/2014   TRIG 139.0 08/01/2016   CHOLHDL 4 08/01/2016       Thyroid:  Has Previously had mild hypothyroidism for a few years, Possibly when he was taking lithium TSH appears normal consistently without taking any levothyroxine    Lab Results  Component Value Date   TSH 0.89 04/01/2016        He has been Having numbness in his feet especially on the right side, also some discomfort but he says that symptoms are better He thinks elastic support stockings are helping   He had foot exam from PCP showing:    Patchy decrease in sensation in the toes especially right and decreased on the plantar surface also  He is also followed by a podiatrist   LABS:  Lab on 11/03/2016  Component Date Value Ref Range Status  . Fructosamine 11/03/2016 277  0 - 285 umol/L Final   Comment: Published reference interval for  apparently healthy subjects between age 18 and 78 is 36 - 285 umol/L and in a poorly controlled diabetic population is 228 - 563 umol/L with a mean of 396 umol/L.   Marland Kitchen Sodium 11/03/2016 139  135 - 145 mEq/L Final  . Potassium 11/03/2016 3.5  3.5 - 5.1 mEq/L Final  . Chloride 11/03/2016 103  96 - 112 mEq/L Final  . CO2 11/03/2016 28  19 - 32 mEq/L Final  . Glucose, Bld 11/03/2016 197* 70 - 99 mg/dL Final  . BUN 11/03/2016 10  6 - 23 mg/dL Final  . Creatinine, Ser 11/03/2016 1.14  0.40 - 1.50 mg/dL Final  . Calcium 11/03/2016 8.9  8.4 - 10.5 mg/dL Final  . GFR 11/03/2016 69.49  >60.00 mL/min Final  . Hgb A1c MFr Bld 11/03/2016 6.8* 4.6 - 6.5 % Final   Glycemic Control Guidelines for People with Diabetes:Non Diabetic:  <6%Goal of Therapy: <7%Additional Action Suggested:  >8%     Physical Examination:  BP 136/82   Pulse (!) 102   Ht 6\' 1"  (1.854 m)   Wt (!) 301 lb 9.6 oz (136.8 kg)   SpO2 96%   BMI 39.79 kg/m    Repeat pulse 88 with periodic ectopics Minimal lower leg edema present    ASSESSMENT/PLAN:   Diabetes type 2, uncontrolled    See history of present illness for detailed discussion of his current management, blood sugar patterns and problems identified  His blood sugar  control is Overall better with A1c coming down to 6.6 compared to 8% before  He has had improvements in his diet overall along with trying to take the insulin consistently twice a day and also taking it BEFORE his meals However has not lost weight He can still do better on his diet with restricting certain higher fat or high carbohydrate meals or snacks He can also do better with exercise Have reassured him that metformin is safe and effective especially with overnight blood sugars and he should not stop medications on his own   Hypertension:  Blood pressure followed by PCP, relatively better  NEUROPATHY: Symptoms appear to be better possibly from better blood sugar control but he thinks compression  stockings help symptomatically  Low normal potassium: This is likely to be from HCTZ    Recommendations:  He will resume metformin  Continue insulin doses unchanged and preferably try to take insulin 30 minutes before eating  Recheck A1c on the next visit  Exercise daily, start gradually and increase  Continue to improve diet, need to cut back on foods like pop tarts and sausage biscuits  Cut back on HCTZ to half a tablet  Use compression stockings daily  He may need to do an EKG with his PCP to identify nature of ectopics  There are no Patient Instructions on file for this visit.  Counseling time on subjects discussed above is over 50% of today's 25 minute visit   Deloria Brassfield 11/06/2016, 1:46 PM   Note: This office note was prepared with Estate agent. Any transcriptional errors that result from this process are unintentional.

## 2016-11-06 NOTE — Patient Instructions (Addendum)
Check blood sugars on waking up  2-3/7  Also check blood sugars about 2 hours after a meal and do this after different meals by rotation  Recommended blood sugar levels on waking up is 90-130 and about 2 hours after meal is 130-160  Please bring your blood sugar monitor to each visit, thank you  Less starchy sugary foods biscuits  HCTZ 1/2 pill daily

## 2016-11-18 ENCOUNTER — Encounter: Payer: Self-pay | Admitting: Family Medicine

## 2016-11-21 ENCOUNTER — Other Ambulatory Visit: Payer: Self-pay | Admitting: Endocrinology

## 2016-11-26 DIAGNOSIS — R69 Illness, unspecified: Secondary | ICD-10-CM | POA: Diagnosis not present

## 2016-11-27 ENCOUNTER — Telehealth: Payer: Self-pay

## 2016-11-27 NOTE — Telephone Encounter (Signed)
Regarding AWV Removed postpone for opthalmology, colonoscopy, flu and HIV.  Metrics are not met without having documentation Per Stephanie  She declined HIV Eye exam was not due until September cologuard was ordered  Dr. Kim would like all of her metrics closed after discussing with the patient.   Thanks, sh  

## 2016-12-08 ENCOUNTER — Telehealth: Payer: Self-pay | Admitting: *Deleted

## 2016-12-08 NOTE — Telephone Encounter (Signed)
-----   Message from Lucretia Kern, DO sent at 12/07/2016  8:54 PM EDT ----- See if still wishes to do/lab visit? ----- Message ----- From: SYSTEM Sent: 11/29/2016  12:06 AM To: Lucretia Kern, DO

## 2016-12-08 NOTE — Telephone Encounter (Signed)
I called the pt and he stated he had labs done at Dr Ronnie Derby office on 8/6 as Dr Maudie Mercury told him she would send this to their office.

## 2016-12-10 NOTE — Telephone Encounter (Signed)
He would have had to of requested this at the time of lab visit for them to do at another lab, and looks like order was in, but was not done. Could he come here to do?

## 2016-12-15 NOTE — Telephone Encounter (Signed)
I called the pt and informed him of the message below and scheduled a lab appt for tomorrow at 11am.

## 2016-12-16 ENCOUNTER — Other Ambulatory Visit (INDEPENDENT_AMBULATORY_CARE_PROVIDER_SITE_OTHER): Payer: Medicare HMO

## 2016-12-16 DIAGNOSIS — I10 Essential (primary) hypertension: Secondary | ICD-10-CM | POA: Diagnosis not present

## 2016-12-16 DIAGNOSIS — E1159 Type 2 diabetes mellitus with other circulatory complications: Secondary | ICD-10-CM

## 2016-12-16 DIAGNOSIS — I152 Hypertension secondary to endocrine disorders: Secondary | ICD-10-CM

## 2016-12-16 DIAGNOSIS — D696 Thrombocytopenia, unspecified: Secondary | ICD-10-CM

## 2016-12-16 LAB — BASIC METABOLIC PANEL
BUN: 18 mg/dL (ref 6–23)
CALCIUM: 9.1 mg/dL (ref 8.4–10.5)
CO2: 28 meq/L (ref 19–32)
CREATININE: 1.24 mg/dL (ref 0.40–1.50)
Chloride: 104 mEq/L (ref 96–112)
GFR: 63.04 mL/min (ref >60.00)
Glucose, Bld: 160 mg/dL — ABNORMAL HIGH (ref 70–99)
Potassium: 3.7 mEq/L (ref 3.5–5.1)
Sodium: 140 mEq/L (ref 135–145)

## 2016-12-16 LAB — CBC
HCT: 39.8 % (ref 39.0–52.0)
Hemoglobin: 13.5 g/dL (ref 13.0–17.0)
MCHC: 33.8 g/dL (ref 30.0–36.0)
MCV: 78.3 fl (ref 78.0–100.0)
Platelets: 129 10*3/uL — ABNORMAL LOW (ref 150.0–400.0)
RBC: 5.09 Mil/uL (ref 4.22–5.81)
RDW: 14.4 % (ref 11.5–15.5)
WBC: 5.6 10*3/uL (ref 4.0–10.5)

## 2016-12-18 ENCOUNTER — Encounter: Payer: Self-pay | Admitting: Family Medicine

## 2016-12-21 ENCOUNTER — Other Ambulatory Visit: Payer: Self-pay | Admitting: Family Medicine

## 2016-12-31 DIAGNOSIS — Z0100 Encounter for examination of eyes and vision without abnormal findings: Secondary | ICD-10-CM | POA: Diagnosis not present

## 2016-12-31 LAB — HM DIABETES EYE EXAM

## 2017-01-13 ENCOUNTER — Other Ambulatory Visit: Payer: Self-pay | Admitting: Family Medicine

## 2017-01-17 ENCOUNTER — Other Ambulatory Visit: Payer: Self-pay | Admitting: Endocrinology

## 2017-01-23 DIAGNOSIS — R69 Illness, unspecified: Secondary | ICD-10-CM | POA: Diagnosis not present

## 2017-01-24 NOTE — Progress Notes (Signed)
HPI:  Matthew Barnes is a pleasant 61 y.o. here for follow up. Chronic medical problems summarized below were reviewed for changes and stability and were updated as needed below. These issues and their treatment remain stable for the most part. He reports he is doing well today. No complaints. He has been exercising more and is trying eat healthy and has lost some weight. He reports his mood has been good. Not taking any psych pediatric medications currently. Denies CP, SOB, DOE, treatment intolerance or new symptoms. Due for cbc, flu vaccine, hiv screen, AWV 07/2016  HTN/LE edema/Obesity/HLD: -meds: norvasc, hctz, losartan, lovastatin -lifestyle:trying to eat healthier, getting regular exercise  DM: -sees Dr. Dwyane Barnes, endocrinologist -meds: metformin, novolin 70-30 -gabapentin for neuropathy  Bipolar d/o: -sees Psychiatry for management -meds:   Seasonal allergies: -meds: loratadine  GERD: -meds: omeprazole    ROS: See pertinent positives and negatives per HPI.  Past Medical History:  Diagnosis Date  . Bipolar II disorder - Managed by Matthew Barnes [phone] 05/03/2013  . Diabetes mellitus   . Hyperlipidemia   . Hypertension   . Unspecified hypothyroidism 05/03/2013    Past Surgical History:  Procedure Laterality Date  . KNEE SURGERY      Family History  Problem Relation Age of Onset  . Diabetes Mother   . Hypertension Mother   . Heart disease Father   . Heart attack Father 68    Social History   Social History  . Marital status: Single    Spouse name: N/A  . Number of children: N/A  . Years of education: N/A   Social History Main Topics  . Smoking status: Never Smoker  . Smokeless tobacco: Never Used  . Alcohol use No  . Drug use: No  . Sexual activity: No   Other Topics Concern  . None   Social History Narrative   Work or School: on disability for knee arthritis      Home Situation: lives alone      Spiritual Beliefs:Christian       Lifestyle:no regular exercising; diet not great              Current Outpatient Prescriptions:  .  amLODipine (NORVASC) 10 MG tablet, TAKE 1 TABLET BY MOUTH EVERY DAY, Disp: 30 tablet, Rfl: 2 .  gabapentin (NEURONTIN) 300 MG capsule, TAKE 1 CAPSULE BY MOUTH THREE TIMES A DAY, Disp: 90 capsule, Rfl: 3 .  hydrochlorothiazide (HYDRODIURIL) 25 MG tablet, Take 1 tablet (25 mg total) by mouth daily., Disp: 90 tablet, Rfl: 3 .  Insulin Syringe-Needle U-100 (INSULIN SYRINGE 1CC/31GX5/16") 31G X 5/16" 1 ML MISC, Use 3 needles per day, Disp: 100 each, Rfl: 3 .  loratadine (CLARITIN) 10 MG tablet, TAKE 1 TABLET EVERY DAY. **NOT COVERED UNDER INSURANCE, Disp: 90 tablet, Rfl: 1 .  losartan (COZAAR) 100 MG tablet, Take 1 tablet (100 mg total) by mouth daily., Disp: 90 tablet, Rfl: 3 .  lovastatin (MEVACOR) 20 MG tablet, Take 1 tablet (20 mg total) by mouth at bedtime., Disp: 90 tablet, Rfl: 3 .  metFORMIN (GLUCOPHAGE) 1000 MG tablet, TAKE 1 TABLET (1,000 MG TOTAL) BY MOUTH 2 (TWO) TIMES DAILY WITH A MEAL., Disp: 180 tablet, Rfl: 0 .  NOVOLIN 70/30 (70-30) 100 UNIT/ML injection, INJECT 50 UNITS UNDER THE SKIN 3 TIMES DAILY AS DIRECTED (Patient taking differently: INJECT 50 UNITS UNDER THE SKIN 2 TIMES DAILY AS DIRECTED), Disp: 40 mL, Rfl: 3 .  omeprazole (PRILOSEC) 20 MG capsule, TAKE ONE CAPSULE BY MOUTH  EVERY DAY, Disp: 90 capsule, Rfl: 1 .  ONETOUCH VERIO test strip, USE AS INSTRUCTED TO CHECK BLOOD SUGAR THREE TIMES A DAY., Disp: 100 each, Rfl: 5  EXAM:  Vitals:   01/26/17 1103  BP: 120/72  Pulse: 92  Temp: 98.3 F (36.8 C)    Body mass index is 39.46 kg/m.  GENERAL: vitals reviewed and listed above, alert, oriented, appears well hydrated and in no acute distress  HEENT: atraumatic, conjunttiva clear, no obvious abnormalities on inspection of external nose and ears  NECK: no obvious masses on inspection  LUNGS: clear to auscultation bilaterally, no wheezes, rales or rhonchi, good air  movement  CV: HRRR, no peripheral edema  MS: moves all extremities without noticeable abnormality  PSYCH: pleasant and cooperative, no obvious depression or anxiety  ASSESSMENT AND PLAN:  Discussed the following assessment and plan:  Hypertension associated with diabetes (HCC)  Hyperlipidemia, unspecified hyperlipidemia type  DM type 2 with diabetic peripheral neuropathy (HCC)  Bipolar affective, mixed (HCC)  Abnormal CBC - Plan: CBC with Differential/Platelet  -we will recheck a CBC -Congratulated on lifestyle changes -He refuses flu vaccine, reports he had a reaction to this in the past - Updated allergy list -Continue current medications -Patient advised to return or notify a doctor immediately if symptoms worsen or persist or new concerns arise.  Patient Instructions  BEFORE YOU LEAVE: -labs -follow up: 1) follow up 3-4 months 2) AWV Matthew Barnes and CPE w/ Dr. Maudie Barnes in May  Advise regular aerobic exercise (at least 150 minutes per week of sweaty exercise) and a healthy diet. Try to eat at least 5-9 servings of vegetables and fruits per day (not corn, potatoes or bananas.) Avoid sweets, red meat, pork, butter, fried foods, fast food, processed food, excessive dairy, eggs and coconut. Replace bad fats with good fats - fish, nuts and seeds, canola oil, olive oil.   We have ordered labs or studies at this visit. It can take up to 1-2 weeks for results and processing. IF results require follow up or explanation, we will call you with instructions. Clinically stable results will be released to your [facility]. If you have not heard from [place] or cannot find your results in [facility] in 2 weeks please contact our office at [phone].  If you are not yet signed up for [facility], please consider signing up.   WE NOW OFFER   Matthew Barnes's FAST TRACK!!!  SAME DAY Appointments for ACUTE CARE  Such as: Sprains, Injuries, cuts, abrasions, rashes, muscle pain, joint pain, back  pain Colds, flu, sore throats, headache, allergies, cough, fever  Ear pain, sinus and eye infections Abdominal pain, nausea, vomiting, diarrhea, upset stomach Animal/insect bites  3 Easy Ways to Schedule: Walk-In Scheduling Call in scheduling Mychart Sign-up: [email or web address]               Colin Benton R., DO

## 2017-01-26 ENCOUNTER — Ambulatory Visit (INDEPENDENT_AMBULATORY_CARE_PROVIDER_SITE_OTHER): Payer: Medicare HMO | Admitting: Family Medicine

## 2017-01-26 ENCOUNTER — Encounter: Payer: Self-pay | Admitting: Family Medicine

## 2017-01-26 VITALS — BP 120/72 | HR 92 | Temp 98.3°F | Ht 73.0 in | Wt 299.1 lb

## 2017-01-26 DIAGNOSIS — I10 Essential (primary) hypertension: Secondary | ICD-10-CM | POA: Diagnosis not present

## 2017-01-26 DIAGNOSIS — E785 Hyperlipidemia, unspecified: Secondary | ICD-10-CM | POA: Diagnosis not present

## 2017-01-26 DIAGNOSIS — E1142 Type 2 diabetes mellitus with diabetic polyneuropathy: Secondary | ICD-10-CM | POA: Diagnosis not present

## 2017-01-26 DIAGNOSIS — E1159 Type 2 diabetes mellitus with other circulatory complications: Secondary | ICD-10-CM | POA: Diagnosis not present

## 2017-01-26 DIAGNOSIS — I152 Hypertension secondary to endocrine disorders: Secondary | ICD-10-CM

## 2017-01-26 DIAGNOSIS — F3160 Bipolar disorder, current episode mixed, unspecified: Secondary | ICD-10-CM | POA: Diagnosis not present

## 2017-01-26 DIAGNOSIS — R69 Illness, unspecified: Secondary | ICD-10-CM | POA: Diagnosis not present

## 2017-01-26 DIAGNOSIS — R7989 Other specified abnormal findings of blood chemistry: Secondary | ICD-10-CM

## 2017-01-26 LAB — CBC WITH DIFFERENTIAL/PLATELET
BASOS PCT: 1.1 % (ref 0.0–3.0)
Basophils Absolute: 0.1 10*3/uL (ref 0.0–0.1)
EOS PCT: 2.6 % (ref 0.0–5.0)
Eosinophils Absolute: 0.1 10*3/uL (ref 0.0–0.7)
HCT: 40.7 % (ref 39.0–52.0)
Hemoglobin: 13.6 g/dL (ref 13.0–17.0)
LYMPHS ABS: 1.6 10*3/uL (ref 0.7–4.0)
Lymphocytes Relative: 31.1 % (ref 12.0–46.0)
MCHC: 33.5 g/dL (ref 30.0–36.0)
MCV: 78.7 fl (ref 78.0–100.0)
MONO ABS: 0.4 10*3/uL (ref 0.1–1.0)
Monocytes Relative: 7.2 % (ref 3.0–12.0)
NEUTROS PCT: 58 % (ref 43.0–77.0)
Neutro Abs: 2.9 10*3/uL (ref 1.4–7.7)
PLATELETS: 137 10*3/uL — AB (ref 150.0–400.0)
RBC: 5.17 Mil/uL (ref 4.22–5.81)
RDW: 14.3 % (ref 11.5–15.5)
WBC: 5.1 10*3/uL (ref 4.0–10.5)

## 2017-01-26 NOTE — Patient Instructions (Signed)
BEFORE YOU LEAVE: -labs -follow up: 1) follow up 3-4 months 2) AWV Manuela Schwartz and CPE w/ Dr. Maudie Mercury in May  Advise regular aerobic exercise (at least 150 minutes per week of sweaty exercise) and a healthy diet. Try to eat at least 5-9 servings of vegetables and fruits per day (not corn, potatoes or bananas.) Avoid sweets, red meat, pork, butter, fried foods, fast food, processed food, excessive dairy, eggs and coconut. Replace bad fats with good fats - fish, nuts and seeds, canola oil, olive oil.   We have ordered labs or studies at this visit. It can take up to 1-2 weeks for results and processing. IF results require follow up or explanation, we will call you with instructions. Clinically stable results will be released to your [facility]. If you have not heard from [place] or cannot find your results in [facility] in 2 weeks please contact our office at [phone].  If you are not yet signed up for [facility], please consider signing up.   WE NOW OFFER   [facility] FAST TRACK!!!  SAME DAY Appointments for ACUTE CARE  Such as: Sprains, Injuries, cuts, abrasions, rashes, muscle pain, joint pain, back pain Colds, flu, sore throats, headache, allergies, cough, fever  Ear pain, sinus and eye infections Abdominal pain, nausea, vomiting, diarrhea, upset stomach Animal/insect bites  3 Easy Ways to Schedule: Walk-In Scheduling Call in scheduling Mychart Sign-up: [email or web address]

## 2017-01-30 ENCOUNTER — Other Ambulatory Visit: Payer: Self-pay | Admitting: Endocrinology

## 2017-02-05 ENCOUNTER — Encounter: Payer: Self-pay | Admitting: Family Medicine

## 2017-02-05 ENCOUNTER — Other Ambulatory Visit (INDEPENDENT_AMBULATORY_CARE_PROVIDER_SITE_OTHER): Payer: Medicare HMO

## 2017-02-05 ENCOUNTER — Ambulatory Visit (INDEPENDENT_AMBULATORY_CARE_PROVIDER_SITE_OTHER): Payer: Medicare HMO | Admitting: Family Medicine

## 2017-02-05 VITALS — BP 138/90 | HR 87 | Temp 98.5°F | Ht 73.0 in | Wt 301.4 lb

## 2017-02-05 DIAGNOSIS — R079 Chest pain, unspecified: Secondary | ICD-10-CM

## 2017-02-05 DIAGNOSIS — E1165 Type 2 diabetes mellitus with hyperglycemia: Secondary | ICD-10-CM | POA: Diagnosis not present

## 2017-02-05 DIAGNOSIS — R0789 Other chest pain: Secondary | ICD-10-CM | POA: Diagnosis not present

## 2017-02-05 LAB — COMPREHENSIVE METABOLIC PANEL
ALK PHOS: 60 U/L (ref 39–117)
ALT: 27 U/L (ref 0–53)
AST: 30 U/L (ref 0–37)
Albumin: 4.3 g/dL (ref 3.5–5.2)
BILIRUBIN TOTAL: 0.4 mg/dL (ref 0.2–1.2)
BUN: 16 mg/dL (ref 6–23)
CALCIUM: 9.4 mg/dL (ref 8.4–10.5)
CO2: 28 mEq/L (ref 19–32)
Chloride: 104 mEq/L (ref 96–112)
Creatinine, Ser: 1.18 mg/dL (ref 0.40–1.50)
GFR: 66.72 mL/min (ref >60.00)
Glucose, Bld: 102 mg/dL — ABNORMAL HIGH (ref 70–99)
POTASSIUM: 3.8 meq/L (ref 3.5–5.1)
Sodium: 138 mEq/L (ref 135–145)
TOTAL PROTEIN: 7.2 g/dL (ref 6.0–8.3)

## 2017-02-05 LAB — HEMOGLOBIN A1C: Hgb A1c MFr Bld: 6.4 % (ref 4.6–6.5)

## 2017-02-05 NOTE — Progress Notes (Signed)
HPI:  Acute visit for atypical chest pain: -Started 3 days ago -Constant bilateral pectoralis muscle soreness started after doing a weightlifting session at his house, this was a new activity. -No fevers, malaise, shortness of breath, swelling, palpitations or worsening symptoms with exertion, acid reflux or GERD  ROS: See pertinent positives and negatives per HPI.  Past Medical History:  Diagnosis Date  . Bipolar II disorder - Managed by Marye Round [phone] 05/03/2013  . Diabetes mellitus   . Hyperlipidemia   . Hypertension   . Unspecified hypothyroidism 05/03/2013    Past Surgical History:  Procedure Laterality Date  . KNEE SURGERY      Family History  Problem Relation Age of Onset  . Diabetes Mother   . Hypertension Mother   . Heart disease Father   . Heart attack Father 1    Social History   Socioeconomic History  . Marital status: Single    Spouse name: None  . Number of children: None  . Years of education: None  . Highest education level: None  Social Needs  . Financial resource strain: None  . Food insecurity - worry: None  . Food insecurity - inability: None  . Transportation needs - medical: None  . Transportation needs - non-medical: None  Occupational History  . None  Tobacco Use  . Smoking status: Never Smoker  . Smokeless tobacco: Never Used  Substance and Sexual Activity  . Alcohol use: No  . Drug use: No  . Sexual activity: No  Other Topics Concern  . None  Social History Narrative   Work or School: on disability for knee arthritis      Home Situation: lives alone      Spiritual Beliefs:Christian      Lifestyle:no regular exercising; diet not great              Current Outpatient Medications:  .  amLODipine (NORVASC) 10 MG tablet, TAKE 1 TABLET BY MOUTH EVERY DAY, Disp: 30 tablet, Rfl: 2 .  gabapentin (NEURONTIN) 300 MG capsule, TAKE 1 CAPSULE BY MOUTH THREE TIMES A DAY, Disp: 90 capsule, Rfl: 3 .   hydrochlorothiazide (HYDRODIURIL) 25 MG tablet, Take 1 tablet (25 mg total) by mouth daily., Disp: 90 tablet, Rfl: 3 .  Insulin Syringe-Needle U-100 (INSULIN SYRINGE 1CC/31GX5/16") 31G X 5/16" 1 ML MISC, Use 3 needles per day, Disp: 100 each, Rfl: 3 .  loratadine (CLARITIN) 10 MG tablet, TAKE 1 TABLET EVERY DAY. **NOT COVERED UNDER INSURANCE, Disp: 90 tablet, Rfl: 1 .  losartan (COZAAR) 100 MG tablet, Take 1 tablet (100 mg total) by mouth daily., Disp: 90 tablet, Rfl: 3 .  lovastatin (MEVACOR) 20 MG tablet, Take 1 tablet (20 mg total) by mouth at bedtime., Disp: 90 tablet, Rfl: 3 .  metFORMIN (GLUCOPHAGE) 1000 MG tablet, TAKE 1 TABLET (1,000 MG TOTAL) BY MOUTH 2 (TWO) TIMES DAILY WITH A MEAL., Disp: 180 tablet, Rfl: 0 .  NOVOLIN 70/30 (70-30) 100 UNIT/ML injection, INJECT 50 UNITS UNDER THE SKIN 3 TIMES DAILY AS DIRECTED (Patient taking differently: INJECT 50 UNITS UNDER THE SKIN 2 TIMES DAILY AS DIRECTED), Disp: 40 mL, Rfl: 3 .  omeprazole (PRILOSEC) 20 MG capsule, TAKE ONE CAPSULE BY MOUTH EVERY DAY, Disp: 90 capsule, Rfl: 1 .  ONETOUCH VERIO test strip, USE AS INSTRUCTED TO CHECK BLOOD SUGAR THREE TIMES A DAY., Disp: 100 each, Rfl: 5  EXAM:  Vitals:   02/05/17 1639  BP: 138/90  Pulse: 87  Temp: 98.5 F (36.9 C)  SpO2: 97%    Body mass index is 39.76 kg/m.  GENERAL: vitals reviewed and listed above, alert, oriented, appears well hydrated and in no acute distress  HEENT: atraumatic, conjunttiva clear, no obvious abnormalities on inspection of external nose and ears  NECK: no obvious masses on inspection  LUNGS: clear to auscultation bilaterally, no wheezes, rales or rhonchi, good air movement  CV: HRRR, no peripheral edema  MS: moves all extremities without noticeable abnormality seen bilateral tenderness to palpation in the pectoralis major muscles  PSYCH: pleasant and cooperative, no obvious depression or anxiety  ASSESSMENT AND PLAN:  Discussed the following assessment and  plan:  Chest pain, unspecified type - Plan: EKG 12-Lead, Exercise Tolerance Test  Chest wall pain - Plan: DG Chest 2 View, Exercise Tolerance Test  -Discussed potential etiologies for his chest pain, musculoskeletal is most likely -For this very advised heat, NSAIDs and stretching -To rule out other causes, given his past medical history an EKG was performed, normal sinus rhythm -He also will get a chest x-ray and stress test -If he has worsening symptoms, exertional symptoms or symptoms associated with any other symptoms such as shortness of breath he is to call immediately to the emergency room   Patient Instructions  BEFORE YOU LEAVE: -xray sheet -follow up 1 week  Go get the chest xray.  Heat and gentle stretching as we discussed  Aleve 1 tablet every 12 hours as needed  We placed a referral for you as discussed. It usually takes about 1week to process and schedule this referral. If you have not heard from [place] regarding this appointment in 1 weeks please contact our office.  I hope you are feeling better soon! Seek care immediately if worsening, new concerns or you are not improving with treatment.     Matthew Barnes R., DO

## 2017-02-05 NOTE — Patient Instructions (Addendum)
BEFORE YOU LEAVE: -xray sheet -follow up 1 week  Go get the chest xray.  Heat and gentle stretching as we discussed  Aleve 1 tablet every 12 hours as needed  We placed a referral for you as discussed. It usually takes about 1week to process and schedule this referral. If you have not heard from [place] regarding this appointment in 1 weeks please contact our office.  I hope you are feeling better soon! Seek care immediately if worsening, new concerns or you are not improving with treatment.

## 2017-02-06 ENCOUNTER — Other Ambulatory Visit: Payer: Medicare HMO

## 2017-02-09 ENCOUNTER — Ambulatory Visit (INDEPENDENT_AMBULATORY_CARE_PROVIDER_SITE_OTHER)
Admission: RE | Admit: 2017-02-09 | Discharge: 2017-02-09 | Disposition: A | Discharge status: Home / Self Care | Payer: Medicare HMO | Source: Ambulatory Visit | Attending: Family Medicine | Admitting: Family Medicine

## 2017-02-09 DIAGNOSIS — R0602 Shortness of breath: Secondary | ICD-10-CM | POA: Diagnosis not present

## 2017-02-09 DIAGNOSIS — R0789 Other chest pain: Secondary | ICD-10-CM | POA: Diagnosis not present

## 2017-02-09 DIAGNOSIS — R05 Cough: Secondary | ICD-10-CM | POA: Diagnosis not present

## 2017-02-09 IMAGING — DX DG CHEST 2V
2 series · 2 of 2 positions shown · non-contrast
Comparison: Chest x-ray of [DATE]

CLINICAL DATA: Chest wall soreness and cough for the past week
associated with shortness of breath. History of diabetes,
hypertension, gastroesophageal reflux.

EXAM:
CHEST  2 VIEW

[chest pa]
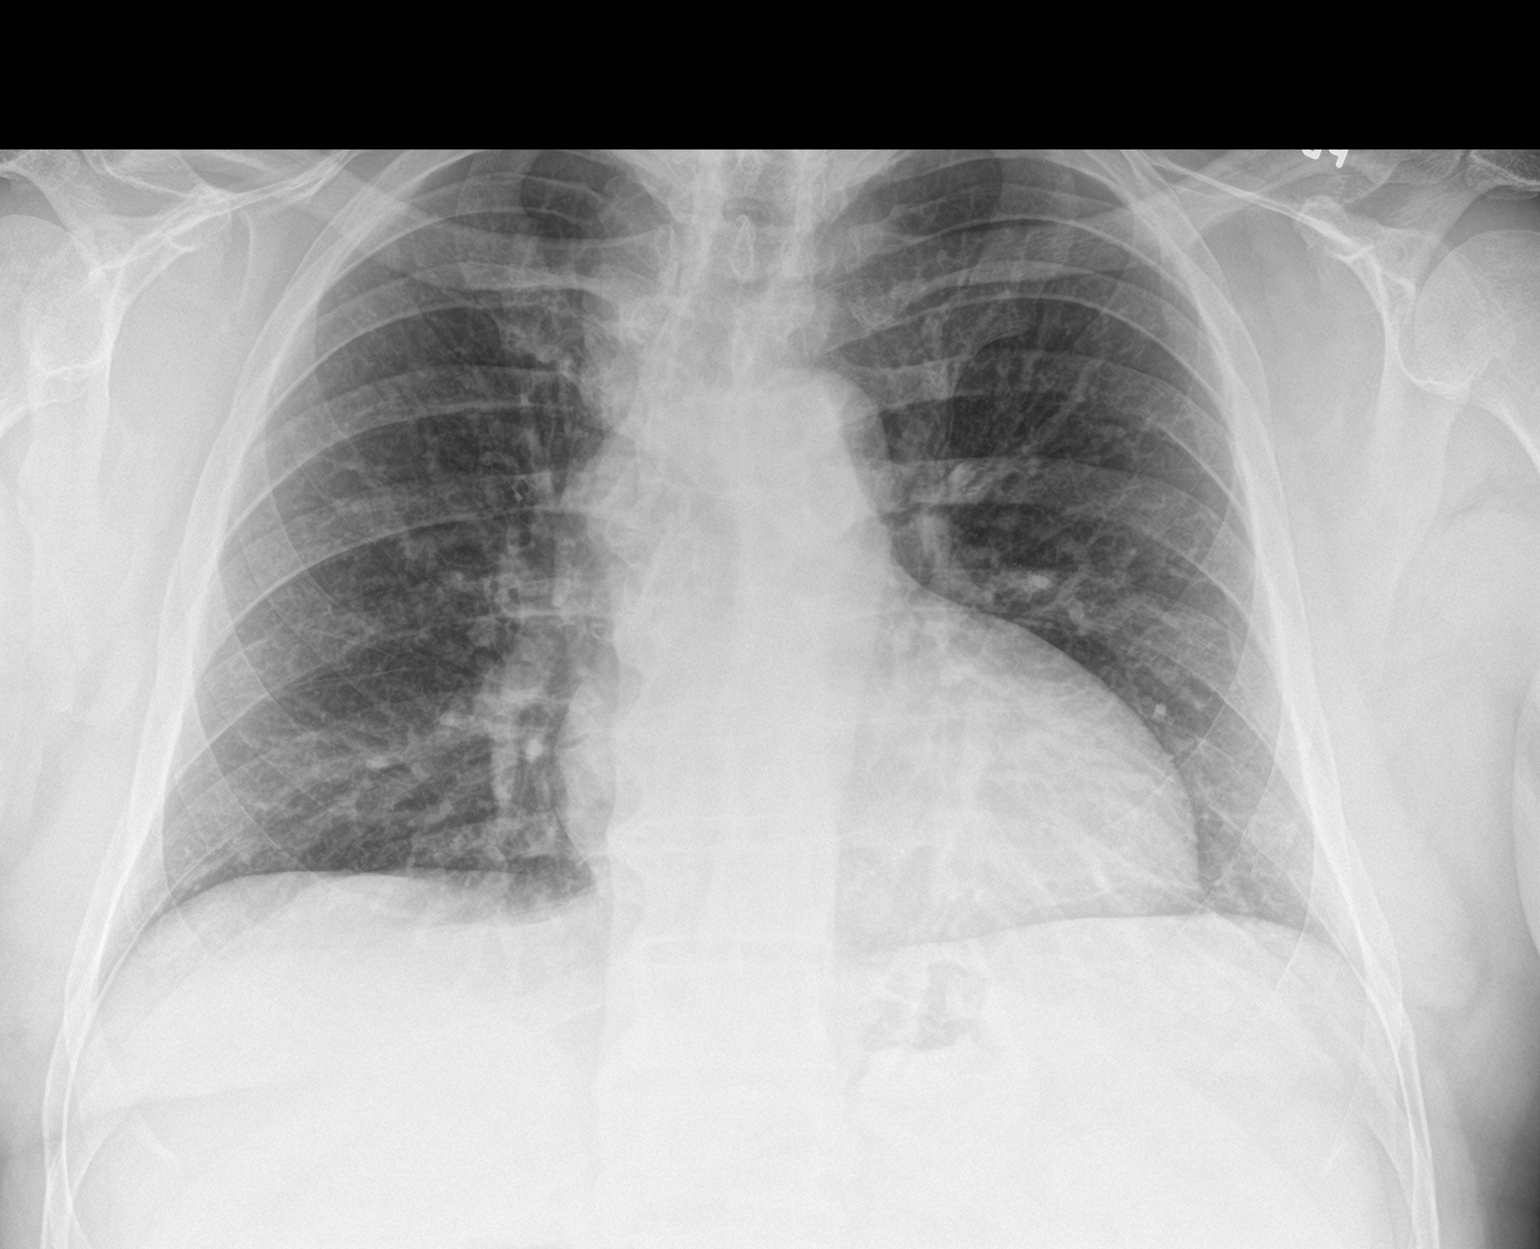

[chest lat]
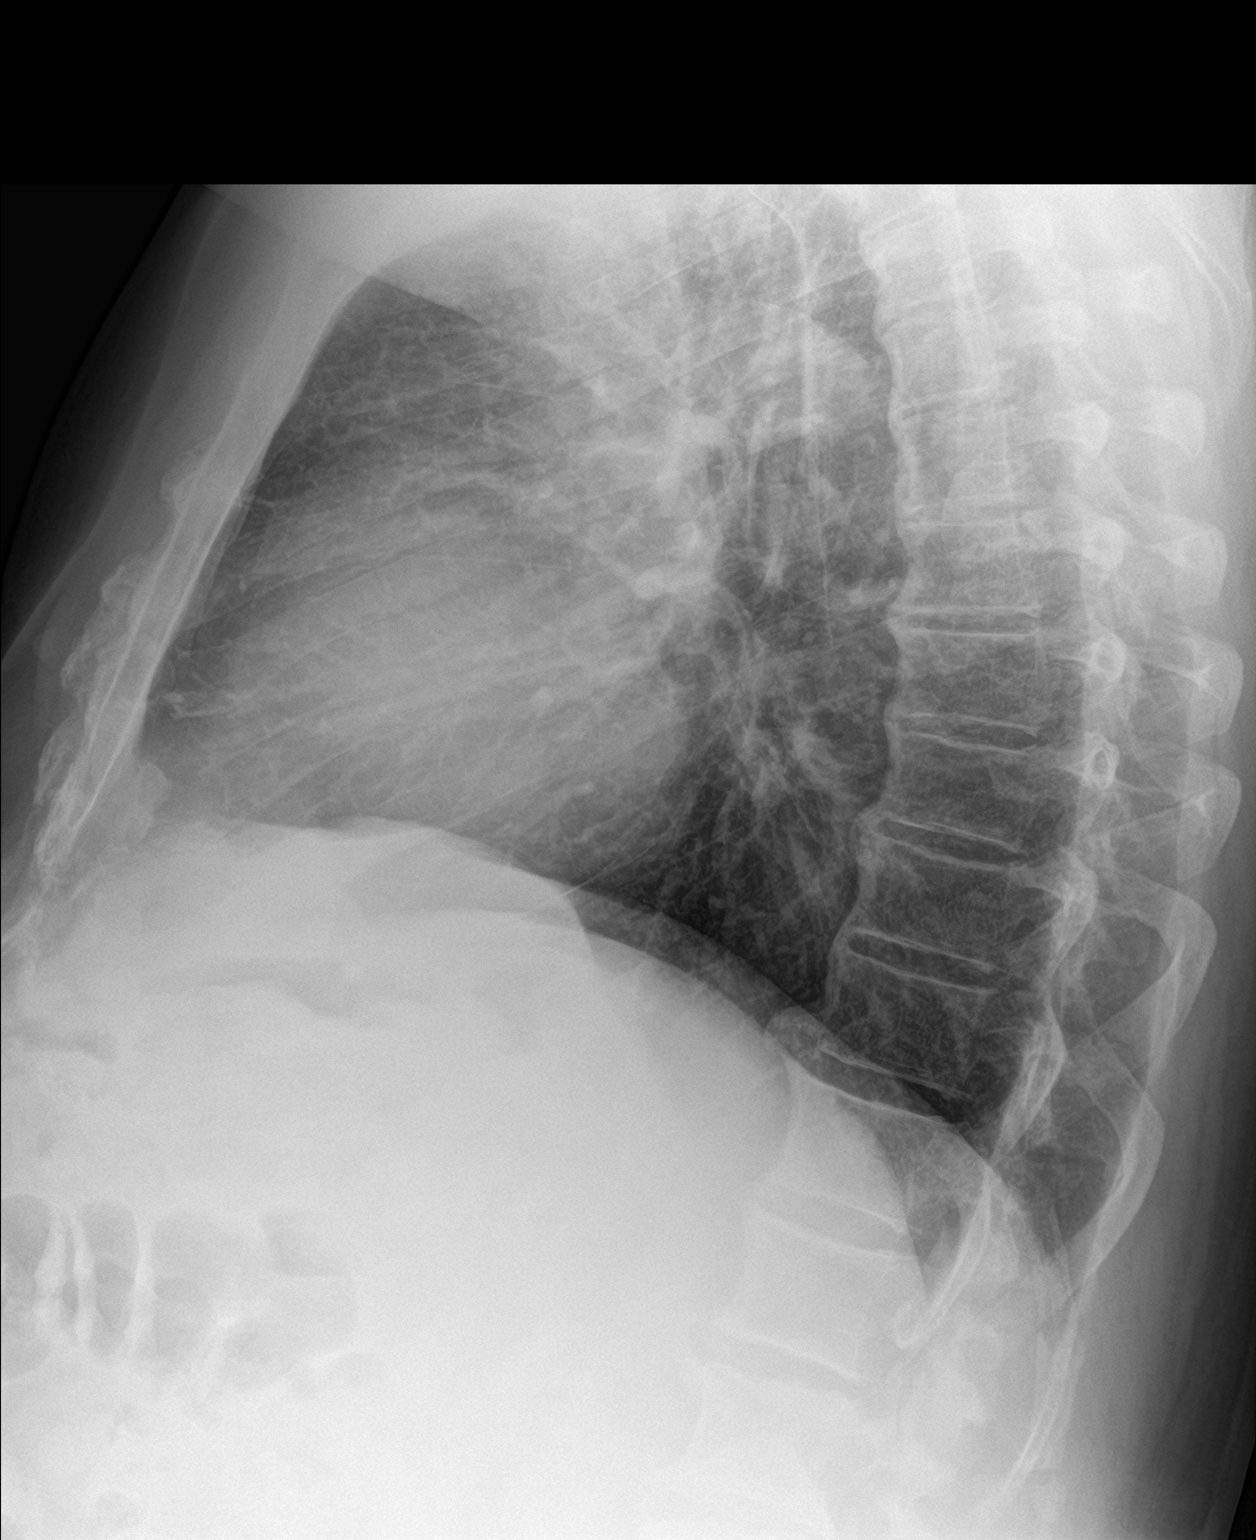

[2 of 2 positions shown; findings below may reference images not displayed]

FINDINGS: The lungs are adequately inflated. The interstitial markings are
coarse though stable. There is no alveolar infiltrate or pleural
effusion. The heart and pulmonary vascularity are normal. There is
calcification of the anterior longitudinal ligament of the thoracic
spine.
IMPRESSION: Mild chronic interstitial changes. No acute cardiopulmonary
abnormality.

## 2017-02-11 ENCOUNTER — Ambulatory Visit: Payer: Medicare HMO | Admitting: Endocrinology

## 2017-02-11 ENCOUNTER — Encounter: Payer: Self-pay | Admitting: Endocrinology

## 2017-02-11 VITALS — BP 144/90 | HR 82 | Ht 73.0 in | Wt 300.0 lb

## 2017-02-11 DIAGNOSIS — E1165 Type 2 diabetes mellitus with hyperglycemia: Secondary | ICD-10-CM | POA: Diagnosis not present

## 2017-02-11 DIAGNOSIS — Z794 Long term (current) use of insulin: Secondary | ICD-10-CM | POA: Diagnosis not present

## 2017-02-11 DIAGNOSIS — I10 Essential (primary) hypertension: Secondary | ICD-10-CM

## 2017-02-11 NOTE — Progress Notes (Signed)
Patient ID: Matthew Barnes, male   DOB: 05-05-1955, 61 y.o.   MRN: [id number]    Reason for Appointment: Followup for Type 2 Diabetes  Referring physician:  Maudie Mercury  History of Present Illness:          Diagnosis: Type 2 diabetes mellitus, date of diagnosis:   2011       Past history:  He was started on metformin at diagnosis and apparently did have fairly good control initially However about a year later because of poor control he was given insulin in addition. He had been taking Lantus insulin until about 2/15, up to 80 units a day However he does not think his blood sugars  Were controlled with this Because of higher A1c of 13.1% he was referred here for diabetes management in 6/15; was started on glucose monitoring and insulin was changed from low dose Levemir to Humalog mix 70 units a day He was also started on Victoza  to help with blood sugar control and facilitate weight loss on his initial consultation  Recent history:   INSULIN regimen is described as: Novolin mix 70/30, 50 units twice a day  A1c is significantly better at 6.4, previously 6.8    Current blood sugar patterns, management of diabetes and problems:  His blood sugars are more consistently controlled and he has somewhat less variability also  He has started doing better with making sure he is taking his insulin before eating  Also he thinks he is usually trying to watch his diet better with more healthy choices  However still may occasionally have more of a carbohydrate meal in the morning when he is in a hurry  Blood sugars maybe only randomly higher after lunch or dinner but has only one reading over 200  He has done a little exercise but is planning to start using a trampoline at home  His weight has however not come down  No hypoglycemia   Oral hypoglycemic drugs the patient is taking are:  Not taking Metformin 1 g twice a day      Side effects from medications have been: None  Glucose monitoring:   done 1 times a day         Glucometer:  Verio   Blood Glucose readings from review of monitor as below  Mean values apply above for all meters except median for One Touch  PRE-MEAL Fasting Lunch Dinner Bedtime Overall  Glucose range: 108-142  108-184   82-2 07  82-207   Mean/median: 114  132   129  119     Glycemic control:   Lab Results  Component Value Date   HGBA1C 6.4 02/05/2017   HGBA1C 6.8 (H) 11/03/2016   HGBA1C 8.0 (H) 08/01/2016   Lab Results  Component Value Date   MICROALBUR 4.2 (H) 04/04/2016   LDLCALC 43 08/01/2016   CREATININE 1.18 02/05/2017    Self-care: The diet that the patient has been following is: None, unable to control portions and carbs  when depressed  Meals: 3 meals per day (dinner 6 pm-10 pm)          Exercise: Elliptical At home, couple of times a week        Dietician visit: Most recent: never      CDE visit: 08/2013             Weight history: baseline 295  Wt Readings from Last 3 Encounters:  02/11/17 300 lb (136.1 kg)  02/05/17 (!) 301 lb 6.4  oz (136.7 kg)  01/26/17 299 lb 1.6 oz (135.7 kg)   Lab on 02/05/2017  Component Date Value Ref Range Status  . Sodium 02/05/2017 138  135 - 145 mEq/L Final  . Potassium 02/05/2017 3.8  3.5 - 5.1 mEq/L Final  . Chloride 02/05/2017 104  96 - 112 mEq/L Final  . CO2 02/05/2017 28  19 - 32 mEq/L Final  . Glucose, Bld 02/05/2017 102* 70 - 99 mg/dL Final  . BUN 02/05/2017 16  6 - 23 mg/dL Final  . Creatinine, Ser 02/05/2017 1.18  0.40 - 1.50 mg/dL Final  . Total Bilirubin 02/05/2017 0.4  0.2 - 1.2 mg/dL Final  . Alkaline Phosphatase 02/05/2017 60  39 - 117 U/L Final  . AST 02/05/2017 30  0 - 37 U/L Final  . ALT 02/05/2017 27  0 - 53 U/L Final  . Total Protein 02/05/2017 7.2  6.0 - 8.3 g/dL Final  . Albumin 02/05/2017 4.3  3.5 - 5.2 g/dL Final  . Calcium 02/05/2017 9.4  8.4 - 10.5 mg/dL Final  . GFR 02/05/2017 66.72  >60.00 mL/min Final  . Hgb A1c MFr Bld 02/05/2017 6.4  4.6 - 6.5 % Final    Glycemic Control Guidelines for People with Diabetes:Non Diabetic:  <6%Goal of Therapy: <7%Additional Action Suggested:  >8%       Allergies as of 02/11/2017      Reactions   Influenza Vaccines Swelling   Reports of swelling of the arm and then sick for 10 days      Medication List        Accurate as of 02/11/17 12:52 PM. Always use your most recent med list.          amLODipine 10 MG tablet Commonly known as:  NORVASC TAKE 1 TABLET BY MOUTH EVERY DAY   gabapentin 300 MG capsule Commonly known as:  NEURONTIN TAKE 1 CAPSULE BY MOUTH THREE TIMES A DAY   hydrochlorothiazide 25 MG tablet Commonly known as:  HYDRODIURIL Take 1 tablet (25 mg total) by mouth daily.   INSULIN SYRINGE 1CC/31GX5/16" 31G X 5/16" 1 ML Misc Use 3 needles per day   loratadine 10 MG tablet Commonly known as:  CLARITIN TAKE 1 TABLET EVERY DAY. **NOT COVERED UNDER INSURANCE   losartan 100 MG tablet Commonly known as:  COZAAR Take 1 tablet (100 mg total) by mouth daily.   lovastatin 20 MG tablet Commonly known as:  MEVACOR Take 1 tablet (20 mg total) by mouth at bedtime.   metFORMIN 1000 MG tablet Commonly known as:  GLUCOPHAGE TAKE 1 TABLET (1,000 MG TOTAL) BY MOUTH 2 (TWO) TIMES DAILY WITH A MEAL.   NOVOLIN 70/30 (70-30) 100 UNIT/ML injection Generic drug:  insulin NPH-regular Human INJECT 50 UNITS UNDER THE SKIN 3 TIMES DAILY AS DIRECTED   omeprazole 20 MG capsule Commonly known as:  PRILOSEC TAKE ONE CAPSULE BY MOUTH EVERY DAY   ONETOUCH VERIO test strip Generic drug:  glucose blood USE AS INSTRUCTED TO CHECK BLOOD SUGAR THREE TIMES A DAY.       Allergies:  Allergies  Allergen Reactions  . Influenza Vaccines Swelling    Reports of swelling of the arm and then sick for 10 days    Past Medical History:  Diagnosis Date  . Bipolar II disorder - Managed by Marye Round [phone]) 05/03/2013  . Diabetes mellitus   . Hyperlipidemia   . Hypertension   . Unspecified  hypothyroidism 05/03/2013    Past Surgical History:  Procedure Laterality  Date  . KNEE SURGERY      Family History  Problem Relation Age of Onset  . Diabetes Mother   . Hypertension Mother   . Heart disease Father   . Heart attack Father 12    Social History:  reports that  has never smoked. he has never used smokeless tobacco. He reports that he does not drink alcohol or use drugs.    Review of Systems   HYPERTENSION:  Currently taking losartan 100 mg and Norvasc 10, he thinks blood pressure is higher from not taking his medication and blood pressure was better last month, followed by PCP   BP Readings from Last 3 Encounters:  02/11/17 (!) 144/90  02/05/17 138/90  01/26/17 120/72         LIPIDS: He has had marked increase in triglycerides previously.  Currently only on 20 mg lovastatin And also followed by PCP        Lab Results  Component Value Date   CHOL 94 08/01/2016   HDL 23.40 (L) 08/01/2016   LDLCALC 43 08/01/2016   LDLDIRECT 43.0 07/24/2014   TRIG 139.0 08/01/2016   CHOLHDL 4 08/01/2016       Thyroid:  Has Previously had mild hypothyroidism for a few years, Possibly when he was taking lithium TSH  normal consistently without taking any levothyroxine    Lab Results  Component Value Date   TSH 0.89 04/01/2016        He has been Having numbness in his feet especially on the right side Using elastic stockings  He had foot exam from PCP showing:   Patchy decrease in sensation in the toes especially right and decreased on the plantar surface also  He is  followed by a podiatrist   LABS:  Lab on 02/05/2017  Component Date Value Ref Range Status  . Sodium 02/05/2017 138  135 - 145 mEq/L Final  . Potassium 02/05/2017 3.8  3.5 - 5.1 mEq/L Final  . Chloride 02/05/2017 104  96 - 112 mEq/L Final  . CO2 02/05/2017 28  19 - 32 mEq/L Final  . Glucose, Bld 02/05/2017 102* 70 - 99 mg/dL Final  . BUN 02/05/2017 16  6 - 23 mg/dL Final  . Creatinine, Ser  02/05/2017 1.18  0.40 - 1.50 mg/dL Final  . Total Bilirubin 02/05/2017 0.4  0.2 - 1.2 mg/dL Final  . Alkaline Phosphatase 02/05/2017 60  39 - 117 U/L Final  . AST 02/05/2017 30  0 - 37 U/L Final  . ALT 02/05/2017 27  0 - 53 U/L Final  . Total Protein 02/05/2017 7.2  6.0 - 8.3 g/dL Final  . Albumin 02/05/2017 4.3  3.5 - 5.2 g/dL Final  . Calcium 02/05/2017 9.4  8.4 - 10.5 mg/dL Final  . GFR 02/05/2017 66.72  >60.00 mL/min Final  . Hgb A1c MFr Bld 02/05/2017 6.4  4.6 - 6.5 % Final   Glycemic Control Guidelines for People with Diabetes:Non Diabetic:  <6%Goal of Therapy: <7%Additional Action Suggested:  >8%     Physical Examination:  BP (!) 144/90   Pulse 82   Ht 6\' 1"  (1.854 m)   Wt 300 lb (136.1 kg)   SpO2 95%   BMI 39.58 kg/m     ASSESSMENT/PLAN:   Diabetes type 2, uncontrolled    See history of present illness for detailed discussion of his current management, blood sugar patterns and problems identified  His blood sugar control is excellent now with A1c 6.4, has been as high  as 8% previously  He has had improvements in his control probably related to his diet being more consistently improved with portions and carbohydrates Also his compliance with regular exercise is improving and he is planning to do some exercise daily Also doing better with adding metformin back He is taking his insulin more consistently before eating which has helped and despite taking only the 70/30 insulin and blood sugars are not usually high after meals also  Hypertension:  Blood pressure followed by PCP, relatively higher the last couple of times and he will follow-up with his PCP   Recommendations: He will with insulin and metformin Consistently needs to exercise He will also try to be consistent with balanced meals and avoiding large amounts of carbohydrate More readings after meals at home  Patient Instructions  Check blood sugars on waking up  3/7  Also check blood sugars about 2 hours  after a meal and do this after different meals by rotation  Recommended blood sugar levels on waking up is 90-130 and about 2 hours after meal is 130-160  Please bring your blood sugar monitor to each visit, thank you      [facility] 02/11/2017, 12:52 PM   Note: This office note was prepared with Dragon voice recognition system technology. Any transcriptional errors that result from this process are unintentional.

## 2017-02-11 NOTE — Patient Instructions (Signed)
Check blood sugars on waking up 3/7   Also check blood sugars about 2 hours after a meal and do this after different meals by rotation  Recommended blood sugar levels on waking up is 90-130 and about 2 hours after meal is 130-160  Please bring your blood sugar monitor to each visit, thank you  

## 2017-02-19 ENCOUNTER — Other Ambulatory Visit: Payer: Self-pay | Admitting: Family Medicine

## 2017-02-24 DIAGNOSIS — R69 Illness, unspecified: Secondary | ICD-10-CM | POA: Diagnosis not present

## 2017-02-27 ENCOUNTER — Other Ambulatory Visit: Payer: Self-pay | Admitting: Family Medicine

## 2017-03-11 ENCOUNTER — Other Ambulatory Visit: Payer: Self-pay

## 2017-03-11 MED ORDER — AMLODIPINE BESYLATE 10 MG PO TABS: 10.0000 mg | ORAL_TABLET | Freq: Every day | ORAL | 2 refills | Status: DC

## 2017-04-09 ENCOUNTER — Encounter: Payer: Self-pay | Admitting: Family Medicine

## 2017-04-15 ENCOUNTER — Other Ambulatory Visit: Payer: Self-pay | Admitting: Family Medicine

## 2017-04-20 DIAGNOSIS — R69 Illness, unspecified: Secondary | ICD-10-CM | POA: Diagnosis not present

## 2017-04-27 NOTE — Progress Notes (Signed)
HPI:  Matthew Barnes is a pleasant 62 y.o. here for follow up. Chronic medical problems summarized below were reviewed for changes and stability and were updated as needed below. These issues and their treatment remain stable for the most part.  Reports is doing well for the most part.  Sees a psychiatrist for his bipolar d/o.  Reports mood has been good.  He wants a refill on Viagra.  We had sent this in before to [facility] drug.  He understands the risks.  Denies CP, SOB, DOE, treatment intolerance or new symptoms.  Trying to eat healthier, trying to get regular activity. Due for flu shot.  HTN/LE edema/Obesity/HLD: -meds: norvasc, hctz, losartan, lovastatin -He decreased his diuretic to 12.5 mg daily as did not tolerate the urinary side effects -lifestyle:trying to eat healthier, getting regular exercise  DM: -sees Dr. Dwyane Dee, endocrinologist -meds: metformin, novolin 70-30 -gabapentin for neuropathy  Bipolar d/o: -sees Psychiatry for management -meds:   Seasonal allergies: -meds: loratadine  GERD: -meds: omeprazole   ROS: See pertinent positives and negatives per HPI.  Past Medical History:  Diagnosis Date  . Bipolar II disorder - Managed by Marye Round [phone]) 05/03/2013  . Diabetes mellitus   . Hyperlipidemia   . Hypertension   . Unspecified hypothyroidism 05/03/2013    Past Surgical History:  Procedure Laterality Date  . KNEE SURGERY      Family History  Problem Relation Age of Onset  . Diabetes Mother   . Hypertension Mother   . Heart disease Father   . Heart attack Father 69    Social History   Socioeconomic History  . Marital status: Single    Spouse name: None  . Number of children: None  . Years of education: None  . Highest education level: None  Social Needs  . Financial resource strain: None  . Food insecurity - worry: None  . Food insecurity - inability: None  . Transportation needs - medical: None  . Transportation needs -  non-medical: None  Occupational History  . None  Tobacco Use  . Smoking status: Never Smoker  . Smokeless tobacco: Never Used  Substance and Sexual Activity  . Alcohol use: No  . Drug use: No  . Sexual activity: No  Other Topics Concern  . None  Social History Narrative   Work or School: on disability for knee arthritis      Home Situation: lives alone      Spiritual Beliefs:Christian      Lifestyle:no regular exercising; diet not great              Current Outpatient Medications:  .  amLODipine (NORVASC) 10 MG tablet, Take 1 tablet (10 mg total) by mouth daily., Disp: 90 tablet, Rfl: 2 .  gabapentin (NEURONTIN) 300 MG capsule, TAKE 1 CAPSULE BY MOUTH THREE TIMES A DAY, Disp: 90 capsule, Rfl: 3 .  hydrochlorothiazide (HYDRODIURIL) 25 MG tablet, Take 1 tablet (25 mg total) by mouth daily., Disp: 90 tablet, Rfl: 3 .  Insulin Syringe-Needle U-100 (INSULIN SYRINGE 1CC/31GX5/16") 31G X 5/16" 1 ML MISC, Use 3 needles per day, Disp: 100 each, Rfl: 3 .  loratadine (CLARITIN) 10 MG tablet, TAKE 1 TABLET EVERY DAY. **NOT COVERED UNDER INSURANCE, Disp: 90 tablet, Rfl: 1 .  losartan (COZAAR) 100 MG tablet, TAKE 1 TABLET EVERY DAY, Disp: 90 tablet, Rfl: 1 .  lovastatin (MEVACOR) 20 MG tablet, TAKE 1 TABLET AT BEDTIME, Disp: 90 tablet, Rfl: 1 .  metFORMIN (GLUCOPHAGE) 1000 MG tablet,  TAKE 1 TABLET (1,000 MG TOTAL) BY MOUTH 2 (TWO) TIMES DAILY WITH A MEAL., Disp: 180 tablet, Rfl: 0 .  NOVOLIN 70/30 (70-30) 100 UNIT/ML injection, INJECT 50 UNITS UNDER THE SKIN 3 TIMES DAILY AS DIRECTED (Patient taking differently: INJECT 50 UNITS UNDER THE SKIN 2 TIMES DAILY AS DIRECTED), Disp: 40 mL, Rfl: 3 .  omeprazole (PRILOSEC) 20 MG capsule, TAKE ONE CAPSULE BY MOUTH EVERY DAY, Disp: 90 capsule, Rfl: 1 .  ONETOUCH VERIO test strip, USE AS INSTRUCTED TO CHECK BLOOD SUGAR THREE TIMES A DAY., Disp: 100 each, Rfl: 5 .  QUEtiapine (SEROQUEL) 50 MG tablet, Take 50 mg by mouth at bedtime., Disp: , Rfl:  .   sildenafil (VIAGRA) 50 MG tablet, Take 1 tablet (50 mg total) by mouth daily as needed for erectile dysfunction. Do not take more then once in 24 hours., Disp: 10 tablet, Rfl: 3  EXAM:  Vitals:   04/28/17 1050  BP: 112/80  Pulse: 93  Temp: 97.7 F (36.5 C)    Body mass index is 38.97 kg/m.  GENERAL: vitals reviewed and listed above, alert, oriented, appears well hydrated and in no acute distress  HEENT: atraumatic, conjunttiva clear, no obvious abnormalities on inspection of external nose and ears  NECK: no obvious masses on inspection  LUNGS: clear to auscultation bilaterally, no wheezes, rales or rhonchi, good air movement  CV: HRRR, no peripheral edema  MS: moves all extremities without noticeable abnormality  PSYCH: pleasant and cooperative, no obvious depression or anxiety  ASSESSMENT AND PLAN:  Discussed the following assessment and plan:  Hypertension associated with diabetes (HCC)  Hyperlipidemia, unspecified hyperlipidemia type  DM type 2 with diabetic peripheral neuropathy (HCC)  Bipolar affective, mixed ([place])  Morbid obesity ([place])  Erectile dysfunction, unspecified erectile dysfunction type  -Discussed lifestyle at length, discussed the importance of a healthy diet and regular aerobic exercise for weight reduction and metabolic health along with mental health -He will be due for his labs at his wellness visit May -Psychiatrist for bipolar disorder -The endocrinologist for his diabetes -He has decreased his diuretic due to urinary symptoms, we will make this change in his notes -discussed his blood pressure today and he prefers to try lifestyle changes -Refilled Viagra after discussion of risks -Patient advised to return or notify a doctor immediately if symptoms worsen or persist or new concerns arise.  Patient Instructions  BEFORE YOU LEAVE: -follow up: AWV with Manuela Schwartz and Follow up with Dr. Maudie Mercury in May - schedule before leaving today. Come  fasting.   We recommend the following healthy lifestyle for LIFE: 1) Small portions. But, make sure to get regular (at least 3 per day), healthy meals and small healthy snacks if needed.  2) Eat a healthy clean diet.   TRY TO EAT: -at least 5-7 servings of low sugar, colorful, and nutrient rich vegetables per day (not corn, potatoes or bananas.) -berries are the best choice if you wish to eat fruit (only eat small amounts if trying to reduce weight)  -lean meets (fish, white meat of chicken or [place]) -vegan proteins for some meals - beans or tofu, whole grains, nuts and seeds -Replace bad fats with good fats - good fats include: fish, nuts and seeds, canola oil, olive oil -small amounts of low fat or non fat dairy -small amounts of100 % whole grains - check the lables -drink plenty of water  AVOID: -SUGAR, sweets, anything with added sugar, corn syrup or sweeteners - must read labels as even  foods advertised as "healthy" often are loaded with sugar -if you must have a sweetener, small amounts of stevia may be best -sweetened beverages and artificially sweetened beverages -simple starches (rice, bread, potatoes, pasta, chips, etc - small amounts of 100% whole grains are ok) -red meat, pork, butter -fried foods, fast food, processed food, excessive dairy, eggs and coconut.  3)Get at least 150 minutes of sweaty aerobic exercise per week.  4)Reduce stress - consider counseling, meditation and relaxation to balance other aspects of your life.     Lucretia Kern, DO

## 2017-04-28 ENCOUNTER — Encounter: Payer: Self-pay | Admitting: Family Medicine

## 2017-04-28 ENCOUNTER — Ambulatory Visit (INDEPENDENT_AMBULATORY_CARE_PROVIDER_SITE_OTHER): Payer: Medicare HMO | Admitting: Family Medicine

## 2017-04-28 VITALS — BP 112/80 | HR 93 | Temp 97.7°F | Ht 73.0 in | Wt 295.4 lb

## 2017-04-28 DIAGNOSIS — N529 Male erectile dysfunction, unspecified: Secondary | ICD-10-CM

## 2017-04-28 DIAGNOSIS — E785 Hyperlipidemia, unspecified: Secondary | ICD-10-CM

## 2017-04-28 DIAGNOSIS — R69 Illness, unspecified: Secondary | ICD-10-CM | POA: Diagnosis not present

## 2017-04-28 DIAGNOSIS — F3160 Bipolar disorder, current episode mixed, unspecified: Secondary | ICD-10-CM

## 2017-04-28 DIAGNOSIS — E1159 Type 2 diabetes mellitus with other circulatory complications: Secondary | ICD-10-CM | POA: Diagnosis not present

## 2017-04-28 DIAGNOSIS — E1142 Type 2 diabetes mellitus with diabetic polyneuropathy: Secondary | ICD-10-CM

## 2017-04-28 DIAGNOSIS — I10 Essential (primary) hypertension: Secondary | ICD-10-CM | POA: Diagnosis not present

## 2017-04-28 DIAGNOSIS — I152 Hypertension secondary to endocrine disorders: Secondary | ICD-10-CM

## 2017-04-28 MED ORDER — HYDROCHLOROTHIAZIDE 25 MG PO TABS: 12.5000 mg | ORAL_TABLET | Freq: Every day | ORAL | 3 refills | Status: DC

## 2017-04-28 MED ORDER — SILDENAFIL CITRATE 50 MG PO TABS
50.0000 mg | ORAL_TABLET | Freq: Every day | ORAL | 3 refills | Status: DC | PRN
Start: 2017-04-28 — End: 2022-11-02

## 2017-04-28 NOTE — Patient Instructions (Addendum)
BEFORE YOU LEAVE: -follow up: AWV with Manuela Schwartz and Follow up with Dr. Maudie Mercury in May - schedule before leaving today. Come fasting.   We recommend the following healthy lifestyle for LIFE: 1) Small portions. But, make sure to get regular (at least 3 per day), healthy meals and small healthy snacks if needed.  2) Eat a healthy clean diet.   TRY TO EAT: -at least 5-7 servings of low sugar, colorful, and nutrient rich vegetables per day (not corn, potatoes or bananas.) -berries are the best choice if you wish to eat fruit (only eat small amounts if trying to reduce weight)  -lean meets (fish, white meat of chicken or [place]) -vegan proteins for some meals - beans or tofu, whole grains, nuts and seeds -Replace bad fats with good fats - good fats include: fish, nuts and seeds, canola oil, olive oil -small amounts of low fat or non fat dairy -small amounts of100 % whole grains - check the lables -drink plenty of water  AVOID: -SUGAR, sweets, anything with added sugar, corn syrup or sweeteners - must read labels as even foods advertised as "healthy" often are loaded with sugar -if you must have a sweetener, small amounts of stevia may be best -sweetened beverages and artificially sweetened beverages -simple starches (rice, bread, potatoes, pasta, chips, etc - small amounts of 100% whole grains are ok) -red meat, pork, butter -fried foods, fast food, processed food, excessive dairy, eggs and coconut.  3)Get at least 150 minutes of sweaty aerobic exercise per week.  4)Reduce stress - consider counseling, meditation and relaxation to balance other aspects of your life.

## 2017-05-08 ENCOUNTER — Other Ambulatory Visit: Payer: Self-pay

## 2017-05-08 MED ORDER — INSULIN NPH ISOPHANE & REGULAR (70-30) 100 UNIT/ML ~~LOC~~ SUSP: SUBCUTANEOUS | 3 refills | Status: DC

## 2017-06-05 ENCOUNTER — Other Ambulatory Visit: Payer: Self-pay | Admitting: Endocrinology

## 2017-06-09 ENCOUNTER — Other Ambulatory Visit (INDEPENDENT_AMBULATORY_CARE_PROVIDER_SITE_OTHER): Payer: Medicare HMO

## 2017-06-09 DIAGNOSIS — E1165 Type 2 diabetes mellitus with hyperglycemia: Secondary | ICD-10-CM

## 2017-06-09 DIAGNOSIS — Z794 Long term (current) use of insulin: Secondary | ICD-10-CM | POA: Diagnosis not present

## 2017-06-09 DIAGNOSIS — D492 Neoplasm of unspecified behavior of bone, soft tissue, and skin: Secondary | ICD-10-CM | POA: Diagnosis not present

## 2017-06-09 DIAGNOSIS — H02413 Mechanical ptosis of bilateral eyelids: Secondary | ICD-10-CM | POA: Diagnosis not present

## 2017-06-09 LAB — COMPREHENSIVE METABOLIC PANEL
ALT: 29 U/L (ref 0–53)
AST: 25 U/L (ref 0–37)
Albumin: 4 g/dL (ref 3.5–5.2)
Alkaline Phosphatase: 48 U/L (ref 39–117)
BILIRUBIN TOTAL: 0.5 mg/dL (ref 0.2–1.2)
BUN: 12 mg/dL (ref 6–23)
CO2: 31 meq/L (ref 19–32)
CREATININE: 1.11 mg/dL (ref 0.40–1.50)
Calcium: 9.4 mg/dL (ref 8.4–10.5)
Chloride: 102 mEq/L (ref 96–112)
GFR: 71.52 mL/min (ref >60.00)
GLUCOSE: 127 mg/dL — AB (ref 70–99)
Potassium: 3.9 mEq/L (ref 3.5–5.1)
Sodium: 140 mEq/L (ref 135–145)
Total Protein: 6.6 g/dL (ref 6.0–8.3)

## 2017-06-09 LAB — TSH: TSH: 0.91 u[IU]/mL (ref 0.35–4.50)

## 2017-06-09 LAB — HEMOGLOBIN A1C: Hgb A1c MFr Bld: 6.2 % (ref 4.6–6.5)

## 2017-06-10 DIAGNOSIS — H02413 Mechanical ptosis of bilateral eyelids: Secondary | ICD-10-CM | POA: Diagnosis not present

## 2017-06-11 ENCOUNTER — Ambulatory Visit: Payer: Medicare HMO | Admitting: Endocrinology

## 2017-06-11 ENCOUNTER — Encounter: Payer: Self-pay | Admitting: Endocrinology

## 2017-06-11 VITALS — BP 138/70 | HR 95 | Ht 73.0 in | Wt 298.0 lb

## 2017-06-11 DIAGNOSIS — Z794 Long term (current) use of insulin: Secondary | ICD-10-CM

## 2017-06-11 DIAGNOSIS — E1165 Type 2 diabetes mellitus with hyperglycemia: Secondary | ICD-10-CM

## 2017-06-11 LAB — MICROALBUMIN / CREATININE URINE RATIO
CREATININE, U: 184.6 mg/dL
MICROALB UR: 3.5 mg/dL — AB (ref 0.0–1.9)
Microalb Creat Ratio: 1.9 mg/g (ref 0.0–30.0)

## 2017-06-11 NOTE — Patient Instructions (Addendum)
Try 46 units in am

## 2017-06-11 NOTE — Progress Notes (Signed)
Patient ID: Matthew Barnes, male   DOB: 1956-01-24, 62 y.o.   MRN: [id number]    Reason for Appointment: Followup for Type 2 Diabetes  Referring physician:  Maudie Mercury  History of Present Illness:          Diagnosis: Type 2 diabetes mellitus, date of diagnosis:   2011       Past history:  He was started on metformin at diagnosis and apparently did have fairly good control initially However about a year later because of poor control he was given insulin in addition. He had been taking Lantus insulin until about 2/15, up to 80 units a day However he does not think his blood sugars  Were controlled with this Because of higher A1c of 13.1% he was referred here for diabetes management in 6/15; was started on glucose monitoring and insulin was changed from low dose Levemir to Humalog mix 70 units a day He was also started on Victoza  to help with blood sugar control and facilitate weight loss on his initial consultation  Recent history:   INSULIN regimen is described as: Novolin mix 70/30, 50 units 1-2x a day  A1c is consistently had a good level, now 6.2 compared to 6.4    Current blood sugar patterns, management of diabetes and problems:  He is checking his blood sugars mostly in the mornings waking up and only occasionally after eating in the morning  He says that he sometimes gets busy and does not take his evening insulin before dinnertime  However most of her sugars in the mornings are fairly good actually recently  Does not think his sugars get low during the night  He does fairly good with his carbohydrate intake and portions most of the time  However he is asking about eating oatmeal in the morning as the only food at breakfast  He does feel sometimes hypoglycemic in the afternoon and most of his sugars before dinnertime are about 100 or less does not do readings after supper  He is trying to walk when he can and staying active  However still has difficulty losing  weight   Oral hypoglycemic drugs the patient is taking are: Metformin 1 g twice a day      Side effects from medications have been: None  Glucose monitoring:  done 1 times a day         Glucometer:  Verio   Blood Glucose readings from review of monitor as below  Mean values apply above for all meters except median for One Touch  PRE-MEAL Fasting Lunch Dinner Bedtime Overall  Glucose range:  95-156   82-105    Mean/median:  106   98  111   POST-MEAL PC Breakfast PC Lunch PC Dinner  Glucose range:  130-194    Mean/median:       Previous readings:  Mean values apply above for all meters except median for One Touch  PRE-MEAL Fasting Lunch Dinner Bedtime Overall  Glucose range: 108-142  108-184   82-2 07  82-207   Mean/median: 114  132   129  119     Glycemic control:   Lab Results  Component Value Date   HGBA1C 6.2 06/09/2017   HGBA1C 6.4 02/05/2017   HGBA1C 6.8 (H) 11/03/2016   Lab Results  Component Value Date   MICROALBUR 4.2 (H) 04/04/2016   LDLCALC 43 08/01/2016   CREATININE 1.11 06/09/2017    Self-care: The diet that the patient has been following is: None,  unable to control portions and carbs  when depressed  Meals: 3 meals per day (dinner 6 pm-10 pm)          Exercise: Elliptical At home, couple of times a week        Dietician visit: Most recent: never      CDE visit: 08/2013             Weight history: baseline 295  Wt Readings from Last 3 Encounters:  06/11/17 298 lb (135.2 kg)  04/28/17 295 lb 6.4 oz (134 kg)  02/11/17 300 lb (136.1 kg)   Lab on 06/09/2017  Component Date Value Ref Range Status  . TSH 06/09/2017 0.91  0.35 - 4.50 uIU/mL Final  . Sodium 06/09/2017 140  135 - 145 mEq/L Final  . Potassium 06/09/2017 3.9  3.5 - 5.1 mEq/L Final  . Chloride 06/09/2017 102  96 - 112 mEq/L Final  . CO2 06/09/2017 31  19 - 32 mEq/L Final  . Glucose, Bld 06/09/2017 127* 70 - 99 mg/dL Final  . BUN 06/09/2017 12  6 - 23 mg/dL Final  . Creatinine, Ser  06/09/2017 1.11  0.40 - 1.50 mg/dL Final  . Total Bilirubin 06/09/2017 0.5  0.2 - 1.2 mg/dL Final  . Alkaline Phosphatase 06/09/2017 48  39 - 117 U/L Final  . AST 06/09/2017 25  0 - 37 U/L Final  . ALT 06/09/2017 29  0 - 53 U/L Final  . Total Protein 06/09/2017 6.6  6.0 - 8.3 g/dL Final  . Albumin 06/09/2017 4.0  3.5 - 5.2 g/dL Final  . Calcium 06/09/2017 9.4  8.4 - 10.5 mg/dL Final  . GFR 06/09/2017 71.52  >60.00 mL/min Final  . Hgb A1c MFr Bld 06/09/2017 6.2  4.6 - 6.5 % Final   Glycemic Control Guidelines for People with Diabetes:Non Diabetic:  <6%Goal of Therapy: <7%Additional Action Suggested:  >8%       Allergies as of 06/11/2017      Reactions   Influenza Vaccines Swelling   Reports of swelling of the arm and then sick for 10 days      Medication List        Accurate as of 06/11/17  9:20 AM. Always use your most recent med list.          amLODipine 10 MG tablet Commonly known as:  NORVASC Take 1 tablet (10 mg total) by mouth daily.   gabapentin 300 MG capsule Commonly known as:  NEURONTIN TAKE 1 CAPSULE BY MOUTH THREE TIMES A DAY   hydrochlorothiazide 25 MG tablet Commonly known as:  HYDRODIURIL Take 0.5 tablets (12.5 mg total) by mouth daily.   insulin NPH-regular Human (70-30) 100 UNIT/ML injection Commonly known as:  NOVOLIN 70/30 INJECT 50 UNITS UNDER THE SKIN 3 TIMES DAILY AS DIRECTED   NOVOLIN 70/30 (70-30) 100 UNIT/ML injection Generic drug:  insulin NPH-regular Human INJECT 50 UNITS UNDER THE SKIN 3 TIMES DAILY AS DIRECTED   INSULIN SYRINGE 1CC/31GX5/16" 31G X 5/16" 1 ML Misc Use 3 needles per day   loratadine 10 MG tablet Commonly known as:  CLARITIN TAKE 1 TABLET EVERY DAY. **NOT COVERED UNDER INSURANCE   losartan 100 MG tablet Commonly known as:  COZAAR TAKE 1 TABLET EVERY DAY   lovastatin 20 MG tablet Commonly known as:  MEVACOR TAKE 1 TABLET AT BEDTIME   metFORMIN 1000 MG tablet Commonly known as:  GLUCOPHAGE TAKE 1 TABLET (1,000 MG  TOTAL) BY MOUTH 2 (TWO) TIMES DAILY WITH A MEAL.  omeprazole 20 MG capsule Commonly known as:  PRILOSEC TAKE ONE CAPSULE BY MOUTH EVERY DAY   ONETOUCH VERIO test strip Generic drug:  glucose blood USE AS INSTRUCTED TO CHECK BLOOD SUGAR THREE TIMES A DAY.   QUEtiapine 50 MG tablet Commonly known as:  SEROQUEL Take 50 mg by mouth at bedtime.   sildenafil 50 MG tablet Commonly known as:  VIAGRA Take 1 tablet (50 mg total) by mouth daily as needed for erectile dysfunction. Do not take more then once in 24 hours.       Allergies:  Allergies  Allergen Reactions  . Influenza Vaccines Swelling    Reports of swelling of the arm and then sick for 10 days    Past Medical History:  Diagnosis Date  . Bipolar II disorder - Managed by Marye Round [phone]) 05/03/2013  . Diabetes mellitus   . Hyperlipidemia   . Hypertension   . Unspecified hypothyroidism 05/03/2013    Past Surgical History:  Procedure Laterality Date  . KNEE SURGERY      Family History  Problem Relation Age of Onset  . Diabetes Mother   . Hypertension Mother   . Heart disease Father   . Heart attack Father 23    Social History:  reports that  has never smoked. he has never used smokeless tobacco. He reports that he does not drink alcohol or use drugs.    Review of Systems   HYPERTENSION:  Currently taking losartan 100 mg and Norvasc 10   BP Readings from Last 3 Encounters:  06/11/17 138/70  04/28/17 112/80  02/11/17 (!) 144/90         LIPIDS: He has had marked increase in triglycerides previously.  Currently only on 20 mg lovastatin And also followed by PCP        Lab Results  Component Value Date   CHOL 94 08/01/2016   HDL 23.40 (L) 08/01/2016   LDLCALC 43 08/01/2016   LDLDIRECT 43.0 07/24/2014   TRIG 139.0 08/01/2016   CHOLHDL 4 08/01/2016       Thyroid:  Has Previously had mild hypothyroidism for a few years, Possibly when he was taking lithium TSH  normal consistently  without taking any levothyroxine    Lab Results  Component Value Date   TSH 0.91 06/09/2017        He has been Having numbness in his feet especially on the right side Using elastic stockings  He had foot exam from PCP showing:   Patchy decrease in sensation in the toes especially right and decreased on the plantar surface also     LABS:  Lab on 06/09/2017  Component Date Value Ref Range Status  . TSH 06/09/2017 0.91  0.35 - 4.50 uIU/mL Final  . Sodium 06/09/2017 140  135 - 145 mEq/L Final  . Potassium 06/09/2017 3.9  3.5 - 5.1 mEq/L Final  . Chloride 06/09/2017 102  96 - 112 mEq/L Final  . CO2 06/09/2017 31  19 - 32 mEq/L Final  . Glucose, Bld 06/09/2017 127* 70 - 99 mg/dL Final  . BUN 06/09/2017 12  6 - 23 mg/dL Final  . Creatinine, Ser 06/09/2017 1.11  0.40 - 1.50 mg/dL Final  . Total Bilirubin 06/09/2017 0.5  0.2 - 1.2 mg/dL Final  . Alkaline Phosphatase 06/09/2017 48  39 - 117 U/L Final  . AST 06/09/2017 25  0 - 37 U/L Final  . ALT 06/09/2017 29  0 - 53 U/L Final  . Total Protein 06/09/2017 6.6  6.0 - 8.3 g/dL Final  . Albumin 06/09/2017 4.0  3.5 - 5.2 g/dL Final  . Calcium 06/09/2017 9.4  8.4 - 10.5 mg/dL Final  . GFR 06/09/2017 71.52  >60.00 mL/min Final  . Hgb A1c MFr Bld 06/09/2017 6.2  4.6 - 6.5 % Final   Glycemic Control Guidelines for People with Diabetes:Non Diabetic:  <6%Goal of Therapy: <7%Additional Action Suggested:  >8%     Physical Examination:  BP 138/70 (BP Location: Left Arm, Patient Position: Sitting, Cuff Size: Large)   Pulse 95   Ht 6\' 1"  (1.854 m)   Wt 298 lb (135.2 kg)   SpO2 96%   BMI 39.32 kg/m     Diabetic Foot Exam - Simple   Simple Foot Form Diabetic Foot exam was performed with the following findings:  Yes 06/11/2017  9:42 AM  Visual Inspection No deformities, no ulcerations, no other skin breakdown bilaterally:  Yes See comments:  Yes Sensation Testing See comments:  Yes Pulse Check Posterior Tibialis and Dorsalis pulse  intact bilaterally:  Yes Comments Decreased monofilament sensation on distal plantar surfaces Mild bony abnormalities of the toes     ASSESSMENT/PLAN:   Diabetes type 2, uncontrolled    See history of present illness for detailed discussion of his current management, blood sugar patterns and problems identified  His blood sugar control is excellent now with A1c going down to 6.2  This is mostly with is trying to cut back on portions and carbohydrates and has been able to be consistent now Surprisingly even without sometimes taking his insulin in the evening he may have good readings in the morning He thinks most of his higher readings are when he has carbohydrates However does not check enough readings after meals especially at night  Hypertension:  Blood pressure followed by PCP and appears better now  Sinus tachycardia: He will discuss this with PCP, appears to be persistent, thyroid levels normal   Recommendations: Reduce morning insulin to 46 units to avoid tendency to low sugars in the afternoon More consistent protein to every meal and discussed importance of balancing meals and not just having carbohydrates especially at breakfast More consistent monitoring of glucose after meals and less fasting to help him adjust his diet also Take evening dose consistently but if sugars are lower overnight will reduce evening dose also to 46 Regular walking  There are no Patient Instructions on file for this visit.    Elayne Snare 06/11/2017, 9:20 AM   Note: This office note was prepared with Dragon voice recognition system technology. Any transcriptional errors that result from this process are unintentional.

## 2017-06-23 ENCOUNTER — Other Ambulatory Visit: Payer: Self-pay | Admitting: Endocrinology

## 2017-06-29 DIAGNOSIS — D492 Neoplasm of unspecified behavior of bone, soft tissue, and skin: Secondary | ICD-10-CM | POA: Diagnosis not present

## 2017-06-29 DIAGNOSIS — H02412 Mechanical ptosis of left eyelid: Secondary | ICD-10-CM | POA: Diagnosis not present

## 2017-06-29 DIAGNOSIS — H02403 Unspecified ptosis of bilateral eyelids: Secondary | ICD-10-CM | POA: Diagnosis not present

## 2017-06-29 DIAGNOSIS — H02411 Mechanical ptosis of right eyelid: Secondary | ICD-10-CM | POA: Diagnosis not present

## 2017-06-29 DIAGNOSIS — H02413 Mechanical ptosis of bilateral eyelids: Secondary | ICD-10-CM | POA: Diagnosis not present

## 2017-06-29 DIAGNOSIS — D2310 Other benign neoplasm of skin of unspecified eyelid, including canthus: Secondary | ICD-10-CM | POA: Diagnosis not present

## 2017-06-30 ENCOUNTER — Other Ambulatory Visit: Payer: Self-pay | Admitting: Family Medicine

## 2017-07-01 ENCOUNTER — Other Ambulatory Visit: Payer: Self-pay | Admitting: Endocrinology

## 2017-07-08 ENCOUNTER — Other Ambulatory Visit: Payer: Self-pay | Admitting: *Deleted

## 2017-07-08 MED ORDER — GABAPENTIN 300 MG PO CAPS: 300.0000 mg | ORAL_CAPSULE | Freq: Three times a day (TID) | ORAL | 0 refills | Status: DC

## 2017-07-15 DIAGNOSIS — R69 Illness, unspecified: Secondary | ICD-10-CM | POA: Diagnosis not present

## 2017-07-18 DIAGNOSIS — R69 Illness, unspecified: Secondary | ICD-10-CM | POA: Diagnosis not present

## 2017-08-10 ENCOUNTER — Other Ambulatory Visit: Payer: Self-pay | Admitting: Endocrinology

## 2017-08-18 ENCOUNTER — Other Ambulatory Visit: Payer: Self-pay | Admitting: Family Medicine

## 2017-08-31 NOTE — Progress Notes (Signed)
HPI:  Using dictation device. Unfortunately this device frequently misinterprets words/phrases.  Matthew Barnes is a pleasant 62 y.o. here for follow up. Chronic medical problems summarized below were reviewed for changes. Doing well her reports, except for his depression and is working with his psychiatrist closely. Denies any thought of harm. Does interfere with motivation for a healthy lifestyle. On levothyroxine in the past for hypothyroidism and wants me to check a TSH again today - sees endocrine for this and had check a few months ago. Wonders though if thyroid could have changed and is causing the depression. Denies CP, SOB, DOE, treatment intolerance or new symptoms. Seeing Manuela Schwartz for AWV today. Due for lipids, cbc  HTN/LE edema/Obesity/HLD: -meds: norvasc, hctz, losartan, lovastatin -He decreased his diuretic to 12.5 mg daily as did not tolerate the urinary side effects -lifestyle:trying to eat healthier, getting regular exercise -BP often up on arrival - then improved after sitting, he admits to anxiety about visits  DM w/ neuropathy: -sees Dr. Dwyane Dee, endocrinologist -meds: metformin, novolin 70-30 -gabapentin for neuropathy  Bipolar d/o: -sees Psychiatry for management -meds:   Seasonal allergies: -meds: loratadine  GERD: -meds: omeprazole   ROS: See pertinent positives and negatives per HPI.  Past Medical History:  Diagnosis Date  . Bipolar II disorder - Managed by Marye Round [phone]) 05/03/2013  . Diabetes mellitus   . Hyperlipidemia   . Hypertension   . Unspecified hypothyroidism 05/03/2013    Past Surgical History:  Procedure Laterality Date  . KNEE SURGERY      Family History  Problem Relation Age of Onset  . Diabetes Mother   . Hypertension Mother   . Heart disease Father   . Heart attack Father 47    SOCIAL HX: see hpi   Current Outpatient Medications:  .  amLODipine (NORVASC) 10 MG tablet, Take 1 tablet (10 mg total) by  mouth daily., Disp: 90 tablet, Rfl: 2 .  gabapentin (NEURONTIN) 300 MG capsule, Take 1 capsule (300 mg total) by mouth 3 (three) times daily., Disp: 270 capsule, Rfl: 0 .  hydrochlorothiazide (HYDRODIURIL) 25 MG tablet, Take 0.5 tablets (12.5 mg total) by mouth daily., Disp: 45 tablet, Rfl: 3 .  insulin NPH-regular Human (NOVOLIN 70/30) (70-30) 100 UNIT/ML injection, INJECT 50 UNITS UNDER THE SKIN 3 TIMES DAILY AS DIRECTED, Disp: 40 mL, Rfl: 3 .  Insulin Syringe-Needle U-100 (INSULIN SYRINGE 1CC/31GX5/16") 31G X 5/16" 1 ML MISC, Use 3 needles per day, Disp: 100 each, Rfl: 3 .  loratadine (CLARITIN) 10 MG tablet, TAKE 1 TABLET EVERY DAY. **NOT COVERED UNDER INSURANCE, Disp: 90 tablet, Rfl: 1 .  losartan (COZAAR) 100 MG tablet, TAKE 1 TABLET BY MOUTH EVERY DAY, Disp: 90 tablet, Rfl: 1 .  lovastatin (MEVACOR) 20 MG tablet, TAKE 1 TABLET BY MOUTH EVERYDAY AT BEDTIME, Disp: 90 tablet, Rfl: 1 .  metFORMIN (GLUCOPHAGE) 1000 MG tablet, TAKE 1 TABLET (1,000 MG TOTAL) BY MOUTH 2 (TWO) TIMES DAILY WITH A MEAL., Disp: 180 tablet, Rfl: 0 .  NOVOLIN 70/30 (70-30) 100 UNIT/ML injection, INJECT 50 UNITS UNDER THE SKIN 3 TIMES DAILY AS DIRECTED, Disp: 40 mL, Rfl: 0 .  omeprazole (PRILOSEC) 20 MG capsule, TAKE 1 CAPSULE BY MOUTH EVERY DAY, Disp: 90 capsule, Rfl: 1 .  ONETOUCH VERIO test strip, USE AS INSTRUCTED TO CHECK BLOOD SUGAR THREE TIMES A DAY., Disp: 100 each, Rfl: 5 .  QUEtiapine (SEROQUEL) 50 MG tablet, Take 50 mg by mouth at bedtime., Disp: , Rfl:  .  sildenafil (VIAGRA)  50 MG tablet, Take 1 tablet (50 mg total) by mouth daily as needed for erectile dysfunction. Do not take more then once in 24 hours., Disp: 10 tablet, Rfl: 3  EXAM:  Vitals:   09/01/17 0944  BP: 118/84  Pulse: 95  Temp: 98.4 F (36.9 C)    Body mass index is 39.62 kg/m.  GENERAL: vitals reviewed and listed above, alert, oriented, appears well hydrated and in no acute distress  HEENT: atraumatic, conjunttiva clear, no obvious  abnormalities on inspection of external nose and ears  NECK: no obvious masses on inspection  LUNGS: clear to auscultation bilaterally, no wheezes, rales or rhonchi, good air movement  CV: HRRR, no peripheral edema  MS: moves all extremities without noticeable abnormality  PSYCH: pleasant and cooperative, no obvious depression or anxiety  ASSESSMENT AND PLAN:  Discussed the following assessment and plan:  Hypertension associated with diabetes ([place]) - Plan: CBC Hyperlipidemia associated with type 2 diabetes mellitus ([place]) - Plan: Lipid panel Type 2 diabetes mellitus with diabetic neuropathy, unspecified whether long term insulin use ([place]) - Plan: TSH Morbid obesity ([place]) -labs -lifestyle recs discussed at length -sees endocrine for diabetes and thyroid but he requests we draw thyroid lab today -bp much better on seated recheck, cont medications and monitor  Bipolar depression ([place]) -sees psychiatry for management - advised he contact them about any worsening or changes - reports they are aware  -Patient advised to return or notify a doctor immediately if symptoms worsen or persist or new concerns arise.  Patient Instructions  BEFORE YOU LEAVE: -AWV with susan - today -labs -follow up: 3-4 months  We have ordered labs or studies at this visit. It can take up to 1-2 weeks for results and processing. IF results require follow up or explanation, we will call you with instructions. Clinically stable results will be released to your [facility]. If you have not heard from [place] or cannot find your results in [facility] in 2 weeks please contact our office at [phone].  If you are not yet signed up for [facility], please consider signing up.   We recommend the following healthy lifestyle for LIFE: 1) Small portions. But, make sure to get regular (at least 3 per day), healthy meals and small healthy snacks if needed.  2) Eat a healthy clean diet.   TRY TO EAT: -at least 5-7 servings of  low sugar, colorful, and nutrient rich vegetables per day (not corn, potatoes or bananas.) -berries are the best choice if you wish to eat fruit (only eat small amounts if trying to reduce weight)  -lean meets (fish, white meat of chicken or [place]) -vegan proteins for some meals - beans or tofu, whole grains, nuts and seeds -Replace bad fats with good fats - good fats include: fish, nuts and seeds, canola oil, olive oil -small amounts of low fat or non fat dairy -small amounts of100 % whole grains - check the lables -drink plenty of water  AVOID: -SUGAR, sweets, anything with added sugar, corn syrup or sweeteners - must read labels as even foods advertised as "healthy" often are loaded with sugar -if you must have a sweetener, small amounts of stevia may be best -sweetened beverages and artificially sweetened beverages -simple starches (rice, bread, potatoes, pasta, chips, etc - small amounts of 100% whole grains are ok) -red meat, pork, butter -fried foods, fast food, processed food, excessive dairy, eggs and coconut.  3)Get at least 150 minutes of sweaty aerobic exercise per week.  4)Reduce  stress - consider counseling, meditation and relaxation to balance other aspects of your life.          Lucretia Kern, DO

## 2017-08-31 NOTE — Progress Notes (Signed)
Subjective:   Matthew Barnes is a 62 y.o. male who presents for Medicare Annual/Subsequent preventive examination.  Reports health as Weight is 304 A1c 6.2  Going to get the Thyroid checked  Depression goes to psychiatrist and is managed    Diet improved from last year   States this year he has changed his diet Novolin 70/30 once a day Eats breakfast and supper  BS down to 6.2   Regular exercise very limited  Has an elliptical at home  Does a lot of walking- moving a a lot     There are no preventive care reminders to display for this patient. Cologuard 10/2016 PSA 08/2014      Objective:    Vitals: BP 118/84   Pulse 95   Ht 6\' 1"  (1.854 m)   Wt 300 lb (136.1 kg)   BMI 39.58 kg/m   Body mass index is 39.58 kg/m.  Advanced Directives 09/01/2017 08/28/2016  Does Patient Have a Medical Advance Directive? No No   Declines this year   Tobacco Social History   Tobacco Use  Smoking Status Never Smoker  Smokeless Tobacco Never Used     Counseling given: Yes   Clinical Intake:    Past Medical History:  Diagnosis Date  . Bipolar II disorder - Managed by Marye Round [phone]) 05/03/2013  . Diabetes mellitus   . Hyperlipidemia   . Hypertension   . Unspecified hypothyroidism 05/03/2013   Past Surgical History:  Procedure Laterality Date  . KNEE SURGERY     Family History  Problem Relation Age of Onset  . Diabetes Mother   . Hypertension Mother   . Heart disease Father   . Heart attack Father 46   Social History   Socioeconomic History  . Marital status: Single    Spouse name: Not on file  . Number of children: Not on file  . Years of education: Not on file  . Highest education level: Not on file  Occupational History  . Not on file  Social Needs  . Financial resource strain: Not on file  . Food insecurity:    Worry: Not on file    Inability: Not on file  . Transportation needs:    Medical: Not on file    Non-medical: Not on file    Tobacco Use  . Smoking status: Never Smoker  . Smokeless tobacco: Never Used  Substance and Sexual Activity  . Alcohol use: No  . Drug use: No  . Sexual activity: Never  Lifestyle  . Physical activity:    Days per week: Not on file    Minutes per session: Not on file  . Stress: Not on file  Relationships  . Social connections:    Talks on phone: Not on file    Gets together: Not on file    Attends religious service: Not on file    Active member of club or organization: Not on file    Attends meetings of clubs or organizations: Not on file    Relationship status: Not on file  Other Topics Concern  . Not on file  Social History Narrative   Work or School: on disability for knee arthritis      Home Situation: lives alone      Spiritual Beliefs:Christian      Lifestyle:no regular exercising; diet not great             Outpatient Encounter Medications as of 09/01/2017  Medication Sig  . amLODipine (  NORVASC) 10 MG tablet Take 1 tablet (10 mg total) by mouth daily.  Marland Kitchen gabapentin (NEURONTIN) 300 MG capsule Take 1 capsule (300 mg total) by mouth 3 (three) times daily.  . hydrochlorothiazide (HYDRODIURIL) 25 MG tablet Take 0.5 tablets (12.5 mg total) by mouth daily.  . insulin NPH-regular Human (NOVOLIN 70/30) (70-30) 100 UNIT/ML injection INJECT 50 UNITS UNDER THE SKIN 3 TIMES DAILY AS DIRECTED  . Insulin Syringe-Needle U-100 (INSULIN SYRINGE 1CC/31GX5/16") 31G X 5/16" 1 ML MISC Use 3 needles per day  . loratadine (CLARITIN) 10 MG tablet TAKE 1 TABLET EVERY DAY. **NOT COVERED UNDER INSURANCE  . losartan (COZAAR) 100 MG tablet TAKE 1 TABLET BY MOUTH EVERY DAY  . lovastatin (MEVACOR) 20 MG tablet TAKE 1 TABLET BY MOUTH EVERYDAY AT BEDTIME  . metFORMIN (GLUCOPHAGE) 1000 MG tablet TAKE 1 TABLET (1,000 MG TOTAL) BY MOUTH 2 (TWO) TIMES DAILY WITH A MEAL.  Marland Kitchen NOVOLIN 70/30 (70-30) 100 UNIT/ML injection INJECT 50 UNITS UNDER THE SKIN 3 TIMES DAILY AS DIRECTED  . omeprazole (PRILOSEC) 20  MG capsule TAKE 1 CAPSULE BY MOUTH EVERY DAY  . ONETOUCH VERIO test strip USE AS INSTRUCTED TO CHECK BLOOD SUGAR THREE TIMES A DAY.  . QUEtiapine (SEROQUEL) 50 MG tablet Take 50 mg by mouth at bedtime.  . sildenafil (VIAGRA) 50 MG tablet Take 1 tablet (50 mg total) by mouth daily as needed for erectile dysfunction. Do not take more then once in 24 hours.   No facility-administered encounter medications on file as of 09/01/2017.     Activities of Daily Living In your present state of health, do you have any difficulty performing the following activities: 09/01/2017  Hearing? N  Vision? N  Difficulty concentrating or making decisions? N  Walking or climbing stairs? N  Dressing or bathing? N  Doing errands, shopping? N  Preparing Food and eating ? N  Using the Toilet? N  In the past six months, have you accidently leaked urine? N  Do you have problems with loss of bowel control? N  Managing your Medications? N  Managing your Finances? N  Housekeeping or managing your Housekeeping? N  Some recent data might be hidden    Patient Care Team: Lucretia Kern, DO as PCP - General (Family Medicine) Adegoroye, Wynona Luna, MD (Specialist)   Assessment:   This is a routine wellness examination for Matthew Barnes.  Exercise Activities and Dietary recommendations Current Exercise Habits: Home exercise routine, Type of exercise: walking, Time (Minutes): 60, Frequency (Times/Week): 3(walks in general), Weekly Exercise (Minutes/Week): 180, Intensity: Mild  Goals    . Patient Stated     Try to get more exercise  Motivation is to get PUMPED    . Weight (lb) < 250 lb (113.4 kg)     Check out  online nutrition programs as GumSearch.nl and http://vang.com/;   There is a lot of other apps you may enjoy; "lose it".  You can track you food with calorieking.com  Check your sodium levels and calories per day with these apps until you find a combination that works for you   Look for foods with "whole" wheat;  bran; oatmeal etc Shot at the farmer's markets in season for fresher choices  Watch for "hydrogenated" on the label of oils which are trans-fats.  Watch for "high fructose corn syrup" in snacks, yogurt or ketchup  Meats have less marbling; bright colored fruits and vegetables;  Canned; dump out liquid and wash vegetables. Be mindful of what we are eating  Portion control is essential to a health weight! Sit down; take a break and enjoy your meal; take smaller bites; put the fork down between bites;  It takes 20 minutes to get full; so check in with your fullness cues and stop eating when you start to fill full              Fall Risk Fall Risk  09/01/2017 08/28/2016 05/17/2014  Falls in the past year? Yes No No  Number falls in past yr: 1 - -  Comment about 4 months ago;  - -  Follow up Education provided - -   Fell x 1 at shop; hurt right shoulder and starting to feel better Full ROM just sore  Depression Screen PHQ 2/9 Scores 09/01/2017 08/28/2016 05/17/2014  PHQ - 2 Score 1 0 0    Cognitive Function MMSE - Mini Mental State Exam 09/01/2017  Not completed: (No Data)     Ad8 score reviewed for issues:  Issues making decisions:  Less interest in hobbies / activities:  Repeats questions, stories (family complaining):  Trouble using ordinary gadgets (microwave, computer, phone):  Forgets the month or year:   Mismanaging finances:   Remembering appts:  Daily problems with thinking and/or memory: Ad8 score is=0      Immunization History  Administered Date(s) Administered  . Influenza,inj,Quad PF,6+ Mos 01/17/2014  . Pneumococcal Polysaccharide-23 01/17/2014      Screening Tests Health Maintenance  Topic Date Due  . HIV Screening  09/28/2018 (Originally 03/10/1971)  . INFLUENZA VACCINE  01/27/2027 (Originally 10/29/2017)  . HEMOGLOBIN A1C  12/10/2017  . OPHTHALMOLOGY EXAM  12/31/2017  . FOOT EXAM  06/12/2018  . PNEUMOCOCCAL POLYSACCHARIDE VACCINE (2)  01/18/2019  . Fecal DNA (Cologuard)  11/07/2019  . TETANUS/TDAP  05/17/2024  . Hepatitis C Screening  Completed        Plan:      PCP Notes   Health Maintenance Doing well;  cologuard completed and neg Discussed HIV and does not feel he is at risk - postpone until next year   Abnormal Screens  none  Referrals  none  Patient concerns; Continues to manage. Set a goal to go to the gym   Nurse Concerns; As noted  Next PCP apt Seen Dr. Maudie Mercury today     I have personally reviewed and noted the following in the patient's chart:   . Medical and social history . Use of alcohol, tobacco or illicit drugs  . Current medications and supplements . Functional ability and status . Nutritional status . Physical activity . Advanced directives . List of other physicians . Hospitalizations, surgeries, and ER visits in previous 12 months . Vitals . Screenings to include cognitive, depression, and falls . Referrals and appointments  In addition, I have reviewed and discussed with patient certain preventive protocols, quality metrics, and best practice recommendations. A written personalized care plan for preventive services as well as general preventive health recommendations were provided to patient.     Wynetta Fines, RN  09/01/2017

## 2017-09-01 ENCOUNTER — Ambulatory Visit (INDEPENDENT_AMBULATORY_CARE_PROVIDER_SITE_OTHER): Payer: Medicare HMO | Admitting: Family Medicine

## 2017-09-01 ENCOUNTER — Ambulatory Visit (INDEPENDENT_AMBULATORY_CARE_PROVIDER_SITE_OTHER): Payer: Medicare HMO

## 2017-09-01 ENCOUNTER — Encounter: Payer: Self-pay | Admitting: Family Medicine

## 2017-09-01 VITALS — BP 118/84 | HR 95 | Ht 73.0 in | Wt 300.0 lb

## 2017-09-01 VITALS — BP 118/84 | HR 95 | Temp 98.4°F | Ht 73.0 in | Wt 300.3 lb

## 2017-09-01 DIAGNOSIS — E1140 Type 2 diabetes mellitus with diabetic neuropathy, unspecified: Secondary | ICD-10-CM | POA: Diagnosis not present

## 2017-09-01 DIAGNOSIS — I152 Hypertension secondary to endocrine disorders: Secondary | ICD-10-CM

## 2017-09-01 DIAGNOSIS — E1169 Type 2 diabetes mellitus with other specified complication: Secondary | ICD-10-CM | POA: Diagnosis not present

## 2017-09-01 DIAGNOSIS — R69 Illness, unspecified: Secondary | ICD-10-CM | POA: Diagnosis not present

## 2017-09-01 DIAGNOSIS — E1159 Type 2 diabetes mellitus with other circulatory complications: Secondary | ICD-10-CM

## 2017-09-01 DIAGNOSIS — I10 Essential (primary) hypertension: Secondary | ICD-10-CM | POA: Diagnosis not present

## 2017-09-01 DIAGNOSIS — F319 Bipolar disorder, unspecified: Secondary | ICD-10-CM | POA: Diagnosis not present

## 2017-09-01 DIAGNOSIS — E785 Hyperlipidemia, unspecified: Secondary | ICD-10-CM | POA: Diagnosis not present

## 2017-09-01 DIAGNOSIS — Z0000 Encounter for general adult medical examination without abnormal findings: Secondary | ICD-10-CM | POA: Diagnosis not present

## 2017-09-01 LAB — TSH: TSH: 1.01 u[IU]/mL (ref 0.35–4.50)

## 2017-09-01 LAB — CBC
HCT: 41.5 % (ref 39.0–52.0)
Hemoglobin: 14 g/dL (ref 13.0–17.0)
MCHC: 33.7 g/dL (ref 30.0–36.0)
MCV: 79.7 fl (ref 78.0–100.0)
Platelets: 121 10*3/uL — ABNORMAL LOW (ref 150.0–400.0)
RBC: 5.21 Mil/uL (ref 4.22–5.81)
RDW: 14.3 % (ref 11.5–15.5)
WBC: 4.6 10*3/uL (ref 4.0–10.5)

## 2017-09-01 LAB — LIPID PANEL
Cholesterol: 126 mg/dL (ref 0–200)
HDL: 22.3 mg/dL — AB (ref >39.00)
NONHDL: 103.56
TRIGLYCERIDES: 255 mg/dL — AB (ref 0.0–149.0)
Total CHOL/HDL Ratio: 6
VLDL: 51 mg/dL — ABNORMAL HIGH (ref 0.0–40.0)

## 2017-09-01 LAB — LDL CHOLESTEROL, DIRECT: Direct LDL: 73 mg/dL

## 2017-09-01 NOTE — Patient Instructions (Addendum)
[name] , Thank you for taking time to come for your Medicare Wellness Visit. I appreciate your ongoing commitment to your health goals. Please review the following plan we discussed and let me know if I can assist you in the future.   These are the goals we discussed: Goals    . Patient Stated     Try to get more exercise  Motivation is to get PUMPED    . Weight (lb) < 250 lb (113.4 kg)     Check out  online nutrition programs as GumSearch.nl and http://vang.com/;   There is a lot of other apps you may enjoy; "lose it".  You can track you food with calorieking.com  Check your sodium levels and calories per day with these apps until you find a combination that works for you   Look for foods with "whole" wheat; bran; oatmeal etc Shot at the farmer's markets in season for fresher choices  Watch for "hydrogenated" on the label of oils which are trans-fats.  Watch for "high fructose corn syrup" in snacks, yogurt or ketchup  Meats have less marbling; bright colored fruits and vegetables;  Canned; dump out liquid and wash vegetables. Be mindful of what we are eating  Portion control is essential to a health weight! Sit down; take a break and enjoy your meal; take smaller bites; put the fork down between bites;  It takes 20 minutes to get full; so check in with your fullness cues and stop eating when you start to fill full              This is a list of the screening recommended for you and due dates:  Health Maintenance  Topic Date Due  . HIV Screening  09/28/2018*  . Flu Shot  01/27/2027*  . Hemoglobin A1C  12/10/2017  . Eye exam for diabetics  12/31/2017  . Complete foot exam   06/12/2018  . Pneumococcal vaccine (2) 01/18/2019  . Cologuard (Stool DNA test)  11/07/2019  . Tetanus Vaccine  05/17/2024  .  Hepatitis C: One time screening is recommended by Center for Disease Control  (CDC) for  adults born from [birth year] through 1965.   Completed  *Topic was postponed. The  date shown is not the original due date.      Fall Prevention in the Home Falls can cause injuries. They can happen to people of all ages. There are many things you can do to make your home safe and to help prevent falls. What can I do on the outside of my home?  Regularly fix the edges of walkways and driveways and fix any cracks.  Remove anything that might make you trip as you walk through a door, such as a raised step or threshold.  Trim any bushes or trees on the path to your home.  Use bright outdoor lighting.  Clear any walking paths of anything that might make someone trip, such as rocks or tools.  Regularly check to see if handrails are loose or broken. Make sure that both sides of any steps have handrails.  Any raised decks and porches should have guardrails on the edges.  Have any leaves, snow, or ice cleared regularly.  Use sand or salt on walking paths during winter.  Clean up any spills in your garage right away. This includes oil or grease spills. What can I do in the bathroom?  Use night lights.  Install grab bars by the toilet and in the tub and shower.  Do not use towel bars as grab bars.  Use non-skid mats or decals in the tub or shower.  If you need to sit down in the shower, use a plastic, non-slip stool.  Keep the floor dry. Clean up any water that spills on the floor as soon as it happens.  Remove soap buildup in the tub or shower regularly.  Attach bath mats securely with double-sided non-slip rug tape.  Do not have throw rugs and other things on the floor that can make you trip. What can I do in the bedroom?  Use night lights.  Make sure that you have a light by your bed that is easy to reach.  Do not use any sheets or blankets that are too big for your bed. They should not hang down onto the floor.  Have a firm chair that has side arms. You can use this for support while you get dressed.  Do not have throw rugs and other things on the  floor that can make you trip. What can I do in the kitchen?  Clean up any spills right away.  Avoid walking on wet floors.  Keep items that you use a lot in easy-to-reach places.  If you need to reach something above you, use a strong step stool that has a grab bar.  Keep electrical cords out of the way.  Do not use floor polish or wax that makes floors slippery. If you must use wax, use non-skid floor wax.  Do not have throw rugs and other things on the floor that can make you trip. What can I do with my stairs?  Do not leave any items on the stairs.  Make sure that there are handrails on both sides of the stairs and use them. Fix handrails that are broken or loose. Make sure that handrails are as long as the stairways.  Check any carpeting to make sure that it is firmly attached to the stairs. Fix any carpet that is loose or worn.  Avoid having throw rugs at the top or bottom of the stairs. If you do have throw rugs, attach them to the floor with carpet tape.  Make sure that you have a light switch at the top of the stairs and the bottom of the stairs. If you do not have them, ask someone to add them for you. What else can I do to help prevent falls?  Wear shoes that: ? Do not have high heels. ? Have rubber bottoms. ? Are comfortable and fit you well. ? Are closed at the toe. Do not wear sandals.  If you use a stepladder: ? Make sure that it is fully opened. Do not climb a closed stepladder. ? Make sure that both sides of the stepladder are locked into place. ? Ask someone to hold it for you, if possible.  Clearly mark and make sure that you can see: ? Any grab bars or handrails. ? First and last steps. ? Where the edge of each step is.  Use tools that help you move around (mobility aids) if they are needed. These include: ? Canes. ? Walkers. ? Scooters. ? Crutches.  Turn on the lights when you go into a dark area. Replace any light bulbs as soon as they burn  out.  Set up your furniture so you have a clear path. Avoid moving your furniture around.  If any of your floors are uneven, fix them.  If there are any pets around you, be  aware of where they are.  Review your medicines with your doctor. Some medicines can make you feel dizzy. This can increase your chance of falling. Ask your doctor what other things that you can do to help prevent falls. This information is not intended to replace advice given to you by your health care provider. Make sure you discuss any questions you have with your health care provider. Document Released: 01/11/2009 Document Revised: 08/23/2015 Document Reviewed: 04/21/2014 Elsevier Interactive Patient Education  2018 [place] Maintenance, Male A healthy lifestyle and preventive care is important for your health and wellness. Ask your health care provider about what schedule of regular examinations is right for you. What should I know about weight and diet? Eat a Healthy Diet  Eat plenty of vegetables, fruits, whole grains, low-fat dairy products, and lean protein.  Do not eat a lot of foods high in solid fats, added sugars, or salt.  Maintain a Healthy Weight Regular exercise can help you achieve or maintain a healthy weight. You should:  Do at least 150 minutes of exercise each week. The exercise should increase your heart rate and make you sweat (moderate-intensity exercise).  Do strength-training exercises at least twice a week.  Watch Your Levels of Cholesterol and Blood Lipids  Have your blood tested for lipids and cholesterol every 5 years starting at 62 years of age. If you are at high risk for heart disease, you should start having your blood tested when you are 62 years old. You may need to have your cholesterol levels checked more often if: ? Your lipid or cholesterol levels are high. ? You are older than 62 years of age. ? You are at high risk for heart disease.  What should I  know about cancer screening? Many types of cancers can be detected early and may often be prevented. Lung Cancer  You should be screened every year for lung cancer if: ? You are a current smoker who has smoked for at least 30 years. ? You are a former smoker who has quit within the past 15 years.  Talk to your health care provider about your screening options, when you should start screening, and how often you should be screened.  Colorectal Cancer  Routine colorectal cancer screening usually begins at 62 years of age and should be repeated every 5-10 years until you are 62 years old. You may need to be screened more often if early forms of precancerous polyps or small growths are found. Your health care provider may recommend screening at an earlier age if you have risk factors for colon cancer.  Your health care provider may recommend using home test kits to check for hidden blood in the stool.  A small camera at the end of a tube can be used to examine your colon (sigmoidoscopy or colonoscopy). This checks for the earliest forms of colorectal cancer.  Prostate and Testicular Cancer  Depending on your age and overall health, your health care provider may do certain tests to screen for prostate and testicular cancer.  Talk to your health care provider about any symptoms or concerns you have about testicular or prostate cancer.  Skin Cancer  Check your skin from head to toe regularly.  Tell your health care provider about any new moles or changes in moles, especially if: ? There is a change in a mole's size, shape, or color. ? You have a mole that is larger than a pencil eraser.  Always use  sunscreen. Apply sunscreen liberally and repeat throughout the day.  Protect yourself by wearing long sleeves, pants, a wide-brimmed hat, and sunglasses when outside.  What should I know about heart disease, diabetes, and high blood pressure?  If you are 51-75 years of age, have your blood  pressure checked every 3-5 years. If you are 33 years of age or older, have your blood pressure checked every year. You should have your blood pressure measured twice-once when you are at a hospital or clinic, and once when you are not at a hospital or clinic. Record the average of the two measurements. To check your blood pressure when you are not at a hospital or clinic, you can use: ? An automated blood pressure machine at a pharmacy. ? A home blood pressure monitor.  Talk to your health care provider about your target blood pressure.  If you are between 51-39 years old, ask your health care provider if you should take aspirin to prevent heart disease.  Have regular diabetes screenings by checking your fasting blood sugar level. ? If you are at a normal weight and have a low risk for diabetes, have this test once every three years after the age of 58. ? If you are overweight and have a high risk for diabetes, consider being tested at a younger age or more often.  A one-time screening for abdominal aortic aneurysm (AAA) by ultrasound is recommended for men aged 66-75 years who are current or former smokers. What should I know about preventing infection? Hepatitis B If you have a higher risk for hepatitis B, you should be screened for this virus. Talk with your health care provider to find out if you are at risk for hepatitis B infection. Hepatitis C Blood testing is recommended for:  Everyone born from [birth year] through 1965.  Anyone with known risk factors for hepatitis C.  Sexually Transmitted Diseases (STDs)  You should be screened each year for STDs including gonorrhea and chlamydia if: ? You are sexually active and are younger than 62 years of age. ? You are older than 62 years of age and your health care provider tells you that you are at risk for this type of infection. ? Your sexual activity has changed since you were last screened and you are at an increased risk for chlamydia or  gonorrhea. Ask your health care provider if you are at risk.  Talk with your health care provider about whether you are at high risk of being infected with HIV. Your health care provider may recommend a prescription medicine to help prevent HIV infection.  What else can I do?  Schedule regular health, dental, and eye exams.  Stay current with your vaccines (immunizations).  Do not use any tobacco products, such as cigarettes, chewing tobacco, and e-cigarettes. If you need help quitting, ask your health care provider.  Limit alcohol intake to no more than 2 drinks per day. One drink equals 12 ounces of beer, 5 ounces of wine, or 1 ounces of hard liquor.  Do not use street drugs.  Do not share needles.  Ask your health care provider for help if you need support or information about quitting drugs.  Tell your health care provider if you often feel depressed.  Tell your health care provider if you have ever been abused or do not feel safe at home. This information is not intended to replace advice given to you by your health care provider. Make sure you discuss any questions you  have with your health care provider. Document Released: 09/13/2007 Document Revised: 11/14/2015 Document Reviewed: 12/19/2014 Elsevier Interactive Patient Education  [employer].

## 2017-09-01 NOTE — Patient Instructions (Addendum)
BEFORE YOU LEAVE: -AWV with susan - today -labs -follow up: 3-4 months  We have ordered labs or studies at this visit. It can take up to 1-2 weeks for results and processing. IF results require follow up or explanation, we will call you with instructions. Clinically stable results will be released to your [facility]. If you have not heard from [place] or cannot find your results in [facility] in 2 weeks please contact our office at [phone].  If you are not yet signed up for [facility], please consider signing up.   We recommend the following healthy lifestyle for LIFE: 1) Small portions. But, make sure to get regular (at least 3 per day), healthy meals and small healthy snacks if needed.  2) Eat a healthy clean diet.   TRY TO EAT: -at least 5-7 servings of low sugar, colorful, and nutrient rich vegetables per day (not corn, potatoes or bananas.) -berries are the best choice if you wish to eat fruit (only eat small amounts if trying to reduce weight)  -lean meets (fish, white meat of chicken or [place]) -vegan proteins for some meals - beans or tofu, whole grains, nuts and seeds -Replace bad fats with good fats - good fats include: fish, nuts and seeds, canola oil, olive oil -small amounts of low fat or non fat dairy -small amounts of100 % whole grains - check the lables -drink plenty of water  AVOID: -SUGAR, sweets, anything with added sugar, corn syrup or sweeteners - must read labels as even foods advertised as "healthy" often are loaded with sugar -if you must have a sweetener, small amounts of stevia may be best -sweetened beverages and artificially sweetened beverages -simple starches (rice, bread, potatoes, pasta, chips, etc - small amounts of 100% whole grains are ok) -red meat, pork, butter -fried foods, fast food, processed food, excessive dairy, eggs and coconut.  3)Get at least 150 minutes of sweaty aerobic exercise per week.  4)Reduce stress - consider counseling, meditation and  relaxation to balance other aspects of your life.

## 2017-09-02 ENCOUNTER — Telehealth: Payer: Self-pay | Admitting: Family Medicine

## 2017-09-02 NOTE — Progress Notes (Signed)
Zigmund Linse R Katilynn Sinkler, DO  

## 2017-09-02 NOTE — Telephone Encounter (Signed)
Copied from [place] [id number]. Topic: Quick Communication - Lab Results >> Sep 02, 2017  1:48 PM Scherrie Gerlach wrote: Pt states he got his lab results, but would like if someone could call him and go over.  Pt has questions

## 2017-09-03 NOTE — Telephone Encounter (Signed)
I called the pt and informed him of the results.

## 2017-09-04 ENCOUNTER — Other Ambulatory Visit: Payer: Self-pay | Admitting: Endocrinology

## 2017-09-14 ENCOUNTER — Other Ambulatory Visit: Payer: Self-pay | Admitting: Endocrinology

## 2017-10-07 ENCOUNTER — Other Ambulatory Visit (INDEPENDENT_AMBULATORY_CARE_PROVIDER_SITE_OTHER): Payer: Medicare HMO

## 2017-10-07 DIAGNOSIS — Z794 Long term (current) use of insulin: Secondary | ICD-10-CM

## 2017-10-07 DIAGNOSIS — E1165 Type 2 diabetes mellitus with hyperglycemia: Secondary | ICD-10-CM | POA: Diagnosis not present

## 2017-10-07 LAB — COMPREHENSIVE METABOLIC PANEL
ALK PHOS: 53 U/L (ref 39–117)
ALT: 27 U/L (ref 0–53)
AST: 26 U/L (ref 0–37)
Albumin: 4.1 g/dL (ref 3.5–5.2)
BUN: 15 mg/dL (ref 6–23)
CALCIUM: 9.1 mg/dL (ref 8.4–10.5)
CO2: 28 meq/L (ref 19–32)
Chloride: 105 mEq/L (ref 96–112)
Creatinine, Ser: 1.3 mg/dL (ref 0.40–1.50)
GFR: 59.53 mL/min — AB (ref >60.00)
GLUCOSE: 168 mg/dL — AB (ref 70–99)
POTASSIUM: 4 meq/L (ref 3.5–5.1)
Sodium: 140 mEq/L (ref 135–145)
TOTAL PROTEIN: 6.5 g/dL (ref 6.0–8.3)
Total Bilirubin: 0.4 mg/dL (ref 0.2–1.2)

## 2017-10-07 LAB — HEMOGLOBIN A1C: Hgb A1c MFr Bld: 6.3 % (ref 4.6–6.5)

## 2017-10-09 DIAGNOSIS — M170 Bilateral primary osteoarthritis of knee: Secondary | ICD-10-CM | POA: Diagnosis not present

## 2017-10-09 DIAGNOSIS — M25561 Pain in right knee: Secondary | ICD-10-CM | POA: Diagnosis not present

## 2017-10-09 DIAGNOSIS — M25562 Pain in left knee: Secondary | ICD-10-CM | POA: Diagnosis not present

## 2017-10-12 ENCOUNTER — Ambulatory Visit: Payer: Medicare HMO | Admitting: Endocrinology

## 2017-10-12 ENCOUNTER — Encounter: Payer: Self-pay | Admitting: Endocrinology

## 2017-10-12 VITALS — BP 150/90 | HR 93 | Ht 73.0 in | Wt 301.0 lb

## 2017-10-12 DIAGNOSIS — E669 Obesity, unspecified: Secondary | ICD-10-CM | POA: Diagnosis not present

## 2017-10-12 DIAGNOSIS — E1169 Type 2 diabetes mellitus with other specified complication: Secondary | ICD-10-CM | POA: Diagnosis not present

## 2017-10-12 NOTE — Progress Notes (Signed)
Patient ID: Matthew Barnes, male   DOB: 05/22/1955, 62 y.o.   MRN: [id number]    Reason for Appointment: Followup for Type 2 Diabetes  Referring physician:  Maudie Mercury  History of Present Illness:          Diagnosis: Type 2 diabetes mellitus, date of diagnosis:   2011       Past history:  He was started on metformin at diagnosis and apparently did have fairly good control initially However about a year later because of poor control he was given insulin in addition. He had been taking Lantus insulin until about 2/15, up to 80 units a day However he does not think his blood sugars  Were controlled with this Because of higher A1c of 13.1% he was referred here for diabetes management in 6/15; was started on glucose monitoring and insulin was changed from low dose Levemir to Humalog mix 70 units a day He was also started on Victoza  to help with blood sugar control and facilitate weight loss on his initial consultation  Recent history:   INSULIN regimen is described as: Novolin mix 70/30, 46 units in the morning only  A1c is consistently at a good level, now 6.3    Current blood sugar patterns, management of diabetes and problems:  He is checking his blood sugars mostly in the mornings and has not done readings after meals as directed  With reducing his morning dose to 46 he has not felt hypoglycemic in the afternoon  Highest blood sugar in the morning has been recently about 140, he thinks it is higher if he has more carbohydrates the night before  However not clear if he has high readings after evening meals  Blood sugars after breakfast appeared to be about 160+  He is trying to do some walking or exercise on his elliptical  However has not lost any weight   Oral hypoglycemic drugs the patient is taking are: Metformin 1 g twice a day      Side effects from medications have been: None  Glucose monitoring:  done 1 times a day         Glucometer:  Verio   Blood Glucose readings from  review of monitor as below  Mean values apply above for all meters except median for One Touch  PRE-MEAL Fasting Lunch Dinner Bedtime Overall  Glucose range: 88-142      Mean/median:      125   POST-MEAL PC Breakfast PC Lunch PC Dinner  Glucose range:  161, 186    Mean/median:      Previous readings:  PRE-MEAL Fasting Lunch Dinner Bedtime Overall  Glucose range:  95-156   82-105    Mean/median:  106   98  111   POST-MEAL PC Breakfast PC Lunch PC Dinner  Glucose range:  130-194    Mean/median:         Glycemic control:   Lab Results  Component Value Date   HGBA1C 6.3 10/07/2017   HGBA1C 6.2 06/09/2017   HGBA1C 6.4 02/05/2017   Lab Results  Component Value Date   MICROALBUR 3.5 (H) 06/11/2017   LDLCALC 43 08/01/2016   CREATININE 1.30 10/07/2017    Self-care: The diet that the patient has been following is: None, unable to control portions and carbs  when depressed  Meals: 3 meals per day (dinner 6 pm-10 pm)          Exercise: Elliptical At home, of times a week  Dietician visit: Most recent: never      CDE visit: 08/2013             Weight history: baseline 295  Wt Readings from Last 3 Encounters:  10/12/17 (!) 301 lb (136.5 kg)  09/01/17 300 lb (136.1 kg)  09/01/17 (!) 300 lb 4.8 oz (136.2 kg)   Lab on 10/07/2017  Component Date Value Ref Range Status  . Sodium 10/07/2017 140  135 - 145 mEq/L Final  . Potassium 10/07/2017 4.0  3.5 - 5.1 mEq/L Final  . Chloride 10/07/2017 105  96 - 112 mEq/L Final  . CO2 10/07/2017 28  19 - 32 mEq/L Final  . Glucose, Bld 10/07/2017 168* 70 - 99 mg/dL Final  . BUN 10/07/2017 15  6 - 23 mg/dL Final  . Creatinine, Ser 10/07/2017 1.30  0.40 - 1.50 mg/dL Final  . Total Bilirubin 10/07/2017 0.4  0.2 - 1.2 mg/dL Final  . Alkaline Phosphatase 10/07/2017 53  39 - 117 U/L Final  . AST 10/07/2017 26  0 - 37 U/L Final  . ALT 10/07/2017 27  0 - 53 U/L Final  . Total Protein 10/07/2017 6.5  6.0 - 8.3 g/dL Final  . Albumin  10/07/2017 4.1  3.5 - 5.2 g/dL Final  . Calcium 10/07/2017 9.1  8.4 - 10.5 mg/dL Final  . GFR 10/07/2017 59.53* >60.00 mL/min Final  . Hgb A1c MFr Bld 10/07/2017 6.3  4.6 - 6.5 % Final   Glycemic Control Guidelines for People with Diabetes:Non Diabetic:  <6%Goal of Therapy: <7%Additional Action Suggested:  >8%       Allergies as of 10/12/2017      Reactions   Influenza Vaccines Swelling   Reports of swelling of the arm and then sick for 10 days      Medication List        Accurate as of 10/12/17  8:59 AM. Always use your most recent med list.          amLODipine 10 MG tablet Commonly known as:  NORVASC Take 1 tablet (10 mg total) by mouth daily.   gabapentin 300 MG capsule Commonly known as:  NEURONTIN Take 1 capsule (300 mg total) by mouth 3 (three) times daily.   hydrochlorothiazide 25 MG tablet Commonly known as:  HYDRODIURIL Take 0.5 tablets (12.5 mg total) by mouth daily.   INSULIN SYRINGE 1CC/31GX5/16" 31G X 5/16" 1 ML Misc Use 3 needles per day   loratadine 10 MG tablet Commonly known as:  CLARITIN TAKE 1 TABLET EVERY DAY. **NOT COVERED UNDER INSURANCE   losartan 100 MG tablet Commonly known as:  COZAAR TAKE 1 TABLET BY MOUTH EVERY DAY   lovastatin 20 MG tablet Commonly known as:  MEVACOR TAKE 1 TABLET BY MOUTH EVERYDAY AT BEDTIME   metFORMIN 1000 MG tablet Commonly known as:  GLUCOPHAGE TAKE 1 TABLET (1,000 MG TOTAL) BY MOUTH 2 (TWO) TIMES DAILY WITH A MEAL.   NOVOLIN 70/30 (70-30) 100 UNIT/ML injection Generic drug:  insulin NPH-regular Human INJECT 50 UNITS UNDER THE SKIN 3 TIMES DAILY AS DIRECTED   omeprazole 20 MG capsule Commonly known as:  PRILOSEC TAKE 1 CAPSULE BY MOUTH EVERY DAY   ONETOUCH VERIO test strip Generic drug:  glucose blood USE AS INSTRUCTED TO CHECK BLOOD SUGAR THREE TIMES A DAY.   QUEtiapine 50 MG tablet Commonly known as:  SEROQUEL Take 50 mg by mouth at bedtime.   sildenafil 50 MG tablet Commonly known as:   VIAGRA Take 1 tablet (  50 mg total) by mouth daily as needed for erectile dysfunction. Do not take more then once in 24 hours.       Allergies:  Allergies  Allergen Reactions  . Influenza Vaccines Swelling    Reports of swelling of the arm and then sick for 10 days    Past Medical History:  Diagnosis Date  . Bipolar II disorder - Managed by Marye Round [phone]) 05/03/2013  . Diabetes mellitus   . Hyperlipidemia   . Hypertension   . Unspecified hypothyroidism 05/03/2013    Past Surgical History:  Procedure Laterality Date  . KNEE SURGERY      Family History  Problem Relation Age of Onset  . Diabetes Mother   . Hypertension Mother   . Heart disease Father   . Heart attack Father 42    Social History:  reports that he has never smoked. He has never used smokeless tobacco. He reports that he does not drink alcohol or use drugs.    Review of Systems   HYPERTENSION:  Currently taking losartan 100 mg and Norvasc 10, HCTZ 12.5 also added by PCP Blood pressure was better in June but higher today, he does not think he has missed his medications He thinks blood pressure is high because of not sleeping much last night Does not monitor at home   BP Readings from Last 3 Encounters:  10/12/17 (!) 150/90  09/01/17 118/84  09/01/17 118/84         LIPIDS: He has had marked increase in triglycerides previously.  Currently only on 20 mg lovastatin And also followed by PCP        Lab Results  Component Value Date   CHOL 126 09/01/2017   HDL 22.30 (L) 09/01/2017   LDLCALC 43 08/01/2016   LDLDIRECT 73.0 09/01/2017   TRIG 255.0 (H) 09/01/2017   CHOLHDL 6 09/01/2017       Thyroid:  Has Previously had mild hypothyroidism for a few years, Possibly when he was taking lithium TSH  normal consistently without taking any levothyroxine    Lab Results  Component Value Date   TSH 1.01 09/01/2017        He has been Having numbness in his feet especially on the  right side, relatively better now and using gabapentin more as needed now Using elastic stockings  He had foot exam from PCP showing:   Patchy decrease in sensation in the toes especially right and decreased on the plantar surface also    LABS:  Lab on 10/07/2017  Component Date Value Ref Range Status  . Sodium 10/07/2017 140  135 - 145 mEq/L Final  . Potassium 10/07/2017 4.0  3.5 - 5.1 mEq/L Final  . Chloride 10/07/2017 105  96 - 112 mEq/L Final  . CO2 10/07/2017 28  19 - 32 mEq/L Final  . Glucose, Bld 10/07/2017 168* 70 - 99 mg/dL Final  . BUN 10/07/2017 15  6 - 23 mg/dL Final  . Creatinine, Ser 10/07/2017 1.30  0.40 - 1.50 mg/dL Final  . Total Bilirubin 10/07/2017 0.4  0.2 - 1.2 mg/dL Final  . Alkaline Phosphatase 10/07/2017 53  39 - 117 U/L Final  . AST 10/07/2017 26  0 - 37 U/L Final  . ALT 10/07/2017 27  0 - 53 U/L Final  . Total Protein 10/07/2017 6.5  6.0 - 8.3 g/dL Final  . Albumin 10/07/2017 4.1  3.5 - 5.2 g/dL Final  . Calcium 10/07/2017 9.1  8.4 - 10.5 mg/dL Final  .  GFR 10/07/2017 59.53* >60.00 mL/min Final  . Hgb A1c MFr Bld 10/07/2017 6.3  4.6 - 6.5 % Final   Glycemic Control Guidelines for People with Diabetes:Non Diabetic:  <6%Goal of Therapy: <7%Additional Action Suggested:  >8%     Physical Examination:  BP (!) 150/90 (BP Location: Left Arm, Patient Position: Sitting, Cuff Size: Normal)   Pulse 93   Ht 6\' 1"  (1.854 m)   Wt (!) 301 lb (136.5 kg)   SpO2 98%   BMI 39.71 kg/m     ASSESSMENT/PLAN:   Diabetes type 2, recent BMI under 40  See history of present illness for detailed discussion of his current management, blood sugar patterns and problems identified  His blood sugar control is excellent now with A1c consistently about the same and now 6.3  Even without taking his evening insulin his blood sugars in the mornings are reasonably good most of the time Also A1c has stayed the same without evening insulin dose indicating that he is not as insulin  deficient now  He will continue the same regimen for now I have emphasized the need to check more blood sugars after lunch and dinner by rotation He will try to be consistent with his low carbohydrate diet and exercise, currently appears more motivated also  Hypertension:  Blood pressure followed by PCP and appears not as well controlled today Recommended that he check his blood pressure at home with an Omron brand and follow-up with PCP as needed especially if blood pressure is higher consistently    May consider adding Invokana to his regimen if blood pressure is not well controlled and he has consistent difficulty losing weight also; usually he prefers generic medications  There are no Patient Instructions on file for this visit.    Elayne Snare 10/12/2017, 8:59 AM   Note: This office note was prepared with Dragon voice recognition system technology. Any transcriptional errors that result from this process are unintentional.

## 2017-10-12 NOTE — Patient Instructions (Addendum)
Check blood sugars on waking up  3/7  Also check blood sugars about 2 hours after a meal and do this after different meals by rotation  Recommended blood sugar levels on waking up is 90-130 and about 2 hours after meal is 130-160  Please bring your blood sugar monitor to each visit, thank you  Get Omron BP meter

## 2017-10-13 ENCOUNTER — Other Ambulatory Visit: Payer: Self-pay | Admitting: Endocrinology

## 2017-10-15 ENCOUNTER — Other Ambulatory Visit: Payer: Self-pay | Admitting: Family Medicine

## 2017-11-02 ENCOUNTER — Other Ambulatory Visit: Payer: Self-pay | Admitting: Endocrinology

## 2017-12-01 ENCOUNTER — Ambulatory Visit (INDEPENDENT_AMBULATORY_CARE_PROVIDER_SITE_OTHER): Payer: Medicare HMO | Admitting: Family Medicine

## 2017-12-01 ENCOUNTER — Encounter: Payer: Self-pay | Admitting: Family Medicine

## 2017-12-01 VITALS — BP 124/80 | HR 98 | Temp 98.3°F | Ht 73.0 in | Wt 299.6 lb

## 2017-12-01 DIAGNOSIS — M545 Low back pain, unspecified: Secondary | ICD-10-CM

## 2017-12-01 DIAGNOSIS — I10 Essential (primary) hypertension: Secondary | ICD-10-CM | POA: Diagnosis not present

## 2017-12-01 DIAGNOSIS — E785 Hyperlipidemia, unspecified: Secondary | ICD-10-CM

## 2017-12-01 DIAGNOSIS — I152 Hypertension secondary to endocrine disorders: Secondary | ICD-10-CM

## 2017-12-01 DIAGNOSIS — E1159 Type 2 diabetes mellitus with other circulatory complications: Secondary | ICD-10-CM | POA: Diagnosis not present

## 2017-12-01 DIAGNOSIS — E1142 Type 2 diabetes mellitus with diabetic polyneuropathy: Secondary | ICD-10-CM | POA: Diagnosis not present

## 2017-12-01 MED ORDER — TIZANIDINE HCL 2 MG PO TABS: 2.0000 mg | ORAL_TABLET | Freq: Every evening | ORAL | 0 refills | Status: DC | PRN

## 2017-12-01 NOTE — Progress Notes (Signed)
HPI:  Using dictation device. Unfortunately this device frequently misinterprets words/phrases.  ILARIO DHALIWAL is a pleasant 62 y.o. here for follow up. Chronic medical problems summarized below were reviewed for changes and stability and were updated as needed below. These issues and their treatment remain stable for the most part. Triglycerides up some last cholesterol check, LDL ok.   BP was up at endo appointment, is improved today. Reports he is trying to eat healthier and get some exercise. New complaint of L low back pain. Reports started 2 months ago, intermittent, 3/10 pain, worse at night. Can't think of inciting event. Reports so ortho remotely for some similar issues. Massage, biofreeze and heat help. Denies radiation, weakness, numbness, bowel/bladder dysfunction. He thinks if from walking funny from knee pain - seeing Dr. Alvan Dame for the knees.  Denies CP, SOB, DOE, treatment intolerance or new symptoms. AWV was 09/01/17  HTN/LE edema/Obesity/HLD: -meds: norvasc, hctz, losartan, lovastatin -He decreased his diuretic to 12.5 mg daily as did not tolerate the urinary side effects -lifestyle:trying to eat healthier, getting regular exercise -BP often up on arrival - then improved after sitting, he admits to anxiety about visits  DM w/ neuropathy: -sees Dr. Dwyane Dee, endocrinologist -meds: metformin, novolin 70-30 -gabapentin for neuropathy  Bipolar d/o: -sees Psychiatry for management -meds: seroquel  Seasonal allergies: -meds: loratadine  GERD: -meds: omeprazole  Thrombocytopenia: -chronic for > 10 years  ROS: See pertinent positives and negatives per HPI.  Past Medical History:  Diagnosis Date  . Bipolar II disorder - Managed by Marye Round [phone]) 05/03/2013  . Diabetes mellitus   . Hyperlipidemia   . Hypertension   . Unspecified hypothyroidism 05/03/2013    Past Surgical History:  Procedure Laterality Date  . KNEE SURGERY      Family History   Problem Relation Age of Onset  . Diabetes Mother   . Hypertension Mother   . Heart disease Father   . Heart attack Father 45    SOCIAL HX: see hpi   Current Outpatient Medications:  .  amLODipine (NORVASC) 10 MG tablet, Take 1 tablet (10 mg total) by mouth daily., Disp: 90 tablet, Rfl: 2 .  gabapentin (NEURONTIN) 300 MG capsule, TAKE 1 CAPSULE BY MOUTH THREE TIMES A DAY, Disp: 270 capsule, Rfl: 0 .  hydrochlorothiazide (HYDRODIURIL) 25 MG tablet, Take 0.5 tablets (12.5 mg total) by mouth daily., Disp: 45 tablet, Rfl: 3 .  Insulin Syringe-Needle U-100 (INSULIN SYRINGE 1CC/31GX5/16") 31G X 5/16" 1 ML MISC, Use 3 needles per day, Disp: 100 each, Rfl: 3 .  loratadine (CLARITIN) 10 MG tablet, TAKE 1 TABLET EVERY DAY. **NOT COVERED UNDER INSURANCE, Disp: 90 tablet, Rfl: 1 .  losartan (COZAAR) 100 MG tablet, TAKE 1 TABLET BY MOUTH EVERY DAY, Disp: 90 tablet, Rfl: 1 .  lovastatin (MEVACOR) 20 MG tablet, TAKE 1 TABLET BY MOUTH EVERYDAY AT BEDTIME, Disp: 90 tablet, Rfl: 1 .  metFORMIN (GLUCOPHAGE) 1000 MG tablet, TAKE 1 TABLET BY MOUTH 2 TIMES DAILY WITH A MEAL., Disp: 180 tablet, Rfl: 0 .  NOVOLIN 70/30 (70-30) 100 UNIT/ML injection, INJECT 50 UNITS UNDER THE SKIN 3 TIMES DAILY AS DIRECTED (Patient taking differently: INJECT 50 UNITS UNDER THE SKIN ONCE DAILY), Disp: 120 mL, Rfl: 1 .  omeprazole (PRILOSEC) 20 MG capsule, TAKE 1 CAPSULE BY MOUTH EVERY DAY, Disp: 90 capsule, Rfl: 1 .  ONETOUCH VERIO test strip, USE AS INSTRUCTED TO CHECK BLOOD SUGAR THREE TIMES A DAY., Disp: 300 each, Rfl: 1 .  QUEtiapine (SEROQUEL) 50  MG tablet, Take 50 mg by mouth at bedtime., Disp: , Rfl:  .  sildenafil (VIAGRA) 50 MG tablet, Take 1 tablet (50 mg total) by mouth daily as needed for erectile dysfunction. Do not take more then once in 24 hours., Disp: 10 tablet, Rfl: 3 .  tiZANidine (ZANAFLEX) 2 MG tablet, Take 1 tablet (2 mg total) by mouth at bedtime as needed for muscle spasms., Disp: 30 tablet, Rfl:  0  EXAM:  Vitals:   12/01/17 1525  BP: 124/80  Pulse: 98  Temp: 98.3 F (36.8 C)    Body mass index is 39.53 kg/m.  GENERAL: vitals reviewed and listed above, alert, oriented, appears well hydrated and in no acute distress  HEENT: atraumatic, conjunttiva clear, no obvious abnormalities on inspection of external nose and ears  NECK: no obvious masses on inspection  LUNGS: clear to auscultation bilaterally, no wheezes, rales or rhonchi, good air movement  CV: HRRR, no peripheral edema  MS: moves all extremities without noticeable abnormality, mild TTP L quad lumb muscle, no bony TTP, normal gait  PSYCH: pleasant and cooperative, no obvious depression or anxiety  ASSESSMENT AND PLAN:  Discussed the following assessment and plan:  Hypertension associated with diabetes ([place]) -better on recheck today -cont medication -recommend healthy diet and regular exercise  Hyperlipidemia, unspecified hyperlipidemia type -lifestyle recs and statin  DM type 2 with diabetic peripheral neuropathy ([facility]) -sees endo for management  Morbid obesity ([place]) -lifestyle recs  Left-sided low back pain without sciatica, unspecified chronicity -we discussed possible serious and likely etiologies, workup and treatment, treatment risks and return precautions - offered plain films today given ongoing for several months. He did not have time today for xrays or further eval (we saw him on time) but he had to leave to go to the airport -after this discussion, Kayleb opted for HEP, symptomatic care, muscle relaxer and close follow up -follow up advised 3-4 weeks with me or ortho -of course, we advised Rameses  to return or notify a doctor sooner if symptoms worsenor new concerns arise.    Patient Instructions  BEFORE YOU LEAVE: -low back exercises -follow up: 1 month  Do the exercises 4 days per week  Heat as needed, biofreeze as needed.  Muscle relaxer at night if needed (tizanidine) per  instructions.  I hope you are feeling better soon! Seek care promptly if your symptoms worsen, new concerns arise or you are not improving with treatment.      Lucretia Kern, DO

## 2017-12-01 NOTE — Patient Instructions (Signed)
BEFORE YOU LEAVE: -low back exercises -follow up: 1 month  Do the exercises 4 days per week  Heat as needed, biofreeze as needed.  Muscle relaxer at night if needed (tizanidine) per instructions.  I hope you are feeling better soon! Seek care promptly if your symptoms worsen, new concerns arise or you are not improving with treatment.

## 2017-12-03 ENCOUNTER — Ambulatory Visit: Payer: Medicare HMO | Admitting: Family Medicine

## 2017-12-24 ENCOUNTER — Other Ambulatory Visit: Payer: Self-pay | Admitting: Family Medicine

## 2017-12-31 ENCOUNTER — Other Ambulatory Visit: Payer: Self-pay | Admitting: Family Medicine

## 2017-12-31 NOTE — Progress Notes (Deleted)
HPI:  Using dictation device. Unfortunately this device frequently misinterprets words/phrases.  Acute visit for back pain: -Started: -Symptoms include: -History of intermittent back pain in the past, has had some worsening pain the last couple months that he thinks is from walking funny from knee pain, sees orthopedics for the knee issues -Felt better with muscle relaxer  ROS: See pertinent positives and negatives per HPI.  Past Medical History:  Diagnosis Date  . Bipolar II disorder - Managed by Marye Round [phone]) 05/03/2013  . Diabetes mellitus   . Hyperlipidemia   . Hypertension   . Unspecified hypothyroidism 05/03/2013    Past Surgical History:  Procedure Laterality Date  . KNEE SURGERY      Family History  Problem Relation Age of Onset  . Diabetes Mother   . Hypertension Mother   . Heart disease Father   . Heart attack Father 15    SOCIAL HX: ***   Current Outpatient Medications:  .  amLODipine (NORVASC) 10 MG tablet, Take 1 tablet (10 mg total) by mouth daily., Disp: 90 tablet, Rfl: 2 .  gabapentin (NEURONTIN) 300 MG capsule, TAKE 1 CAPSULE BY MOUTH THREE TIMES A DAY, Disp: 270 capsule, Rfl: 0 .  hydrochlorothiazide (HYDRODIURIL) 25 MG tablet, Take 0.5 tablets (12.5 mg total) by mouth daily., Disp: 45 tablet, Rfl: 3 .  Insulin Syringe-Needle U-100 (INSULIN SYRINGE 1CC/31GX5/16") 31G X 5/16" 1 ML MISC, Use 3 needles per day, Disp: 100 each, Rfl: 3 .  loratadine (CLARITIN) 10 MG tablet, TAKE 1 TABLET EVERY DAY. **NOT COVERED UNDER INSURANCE, Disp: 90 tablet, Rfl: 1 .  losartan (COZAAR) 100 MG tablet, TAKE 1 TABLET BY MOUTH EVERY DAY, Disp: 90 tablet, Rfl: 1 .  lovastatin (MEVACOR) 20 MG tablet, TAKE 1 TABLET BY MOUTH EVERYDAY AT BEDTIME, Disp: 90 tablet, Rfl: 1 .  metFORMIN (GLUCOPHAGE) 1000 MG tablet, TAKE 1 TABLET BY MOUTH 2 TIMES DAILY WITH A MEAL., Disp: 180 tablet, Rfl: 0 .  NOVOLIN 70/30 (70-30) 100 UNIT/ML injection, INJECT 50 UNITS UNDER THE  SKIN 3 TIMES DAILY AS DIRECTED (Patient taking differently: INJECT 50 UNITS UNDER THE SKIN ONCE DAILY), Disp: 120 mL, Rfl: 1 .  omeprazole (PRILOSEC) 20 MG capsule, TAKE 1 CAPSULE BY MOUTH EVERY DAY, Disp: 90 capsule, Rfl: 0 .  ONETOUCH VERIO test strip, USE AS INSTRUCTED TO CHECK BLOOD SUGAR THREE TIMES A DAY., Disp: 300 each, Rfl: 1 .  QUEtiapine (SEROQUEL) 50 MG tablet, Take 50 mg by mouth at bedtime., Disp: , Rfl:  .  sildenafil (VIAGRA) 50 MG tablet, Take 1 tablet (50 mg total) by mouth daily as needed for erectile dysfunction. Do not take more then once in 24 hours., Disp: 10 tablet, Rfl: 3 .  tiZANidine (ZANAFLEX) 2 MG tablet, Take 1 tablet (2 mg total) by mouth at bedtime as needed for muscle spasms., Disp: 30 tablet, Rfl: 0  EXAM:  There were no vitals filed for this visit.  There is no height or weight on file to calculate BMI.  GENERAL: vitals reviewed and listed above, alert, oriented, appears well hydrated and in no acute distress  HEENT: atraumatic, conjunttiva clear, no obvious abnormalities on inspection of external nose and ears  NECK: no obvious masses on inspection  LUNGS: clear to auscultation bilaterally, no wheezes, rales or rhonchi, good air movement  CV: HRRR, no peripheral edema  MS: moves all extremities without noticeable abnormality *** PSYCH: pleasant and cooperative, no obvious depression or anxiety  ASSESSMENT AND PLAN:  Discussed the following  assessment and plan:  No diagnosis found.  *** -Patient advised to return or notify a doctor immediately if symptoms worsen or persist or new concerns arise.  There are no Patient Instructions on file for this visit.  Lucretia Kern, DO

## 2017-12-31 NOTE — Telephone Encounter (Signed)
I called the pt and informed him of the message below. Patient stated he does not need a refill as he has 20 pills left and still complains of pain.  Appt scheduled for 10/7 at 8:15am.

## 2017-12-31 NOTE — Telephone Encounter (Signed)
Please schedule follow up with me if still having back pain. Ok to send #5 in interim once appt scheduled. Thanks.

## 2018-01-04 ENCOUNTER — Ambulatory Visit: Payer: Medicare HMO | Admitting: Family Medicine

## 2018-01-07 ENCOUNTER — Encounter: Payer: Self-pay | Admitting: Family Medicine

## 2018-01-07 ENCOUNTER — Ambulatory Visit (INDEPENDENT_AMBULATORY_CARE_PROVIDER_SITE_OTHER): Payer: Medicare HMO

## 2018-01-07 ENCOUNTER — Ambulatory Visit (INDEPENDENT_AMBULATORY_CARE_PROVIDER_SITE_OTHER): Payer: Medicare HMO | Admitting: Family Medicine

## 2018-01-07 VITALS — BP 128/70 | HR 90 | Temp 98.2°F | Wt 299.1 lb

## 2018-01-07 DIAGNOSIS — M545 Low back pain, unspecified: Secondary | ICD-10-CM

## 2018-01-07 DIAGNOSIS — G8929 Other chronic pain: Secondary | ICD-10-CM | POA: Diagnosis not present

## 2018-01-07 IMAGING — DX DG LUMBAR SPINE COMPLETE 4+V
5 series · 5 of 5 positions shown · non-contrast
Comparison: [DATE] radiographs

CLINICAL DATA: Chronic low back pain for several years.

EXAM:
LUMBAR SPINE - COMPLETE 4+ VIEW

[lumbar spine ap]
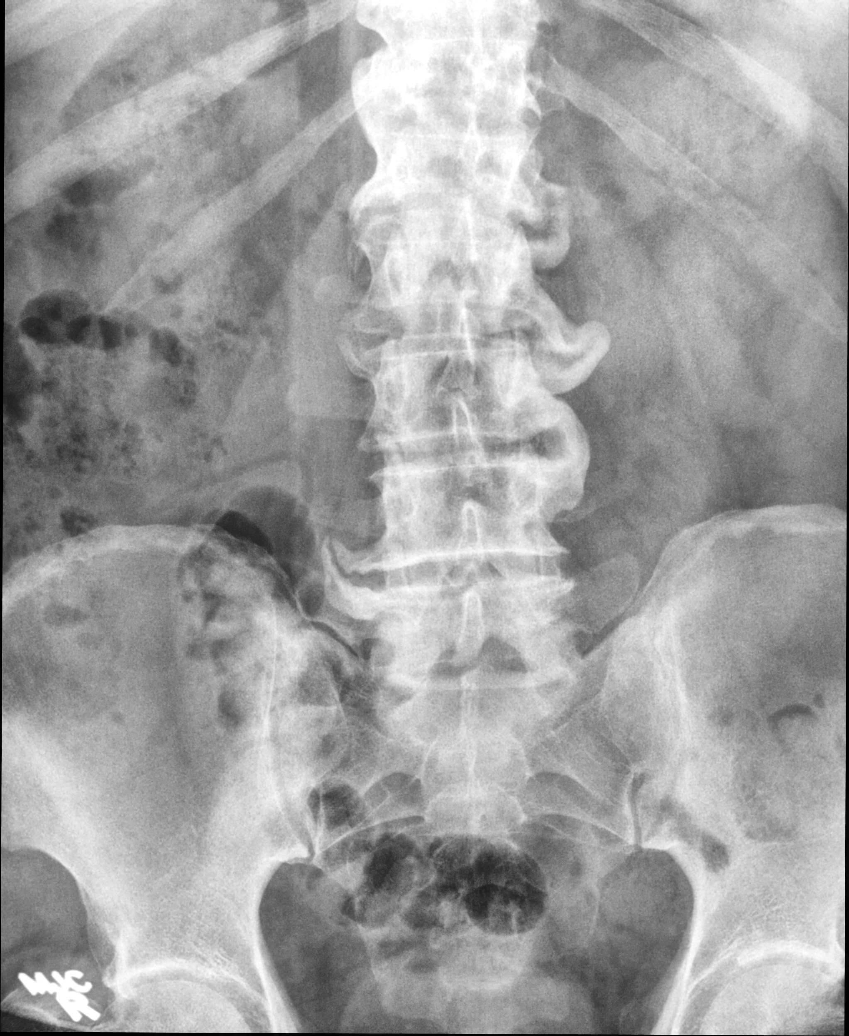

[lumbar spine oblique (1 of 2)]
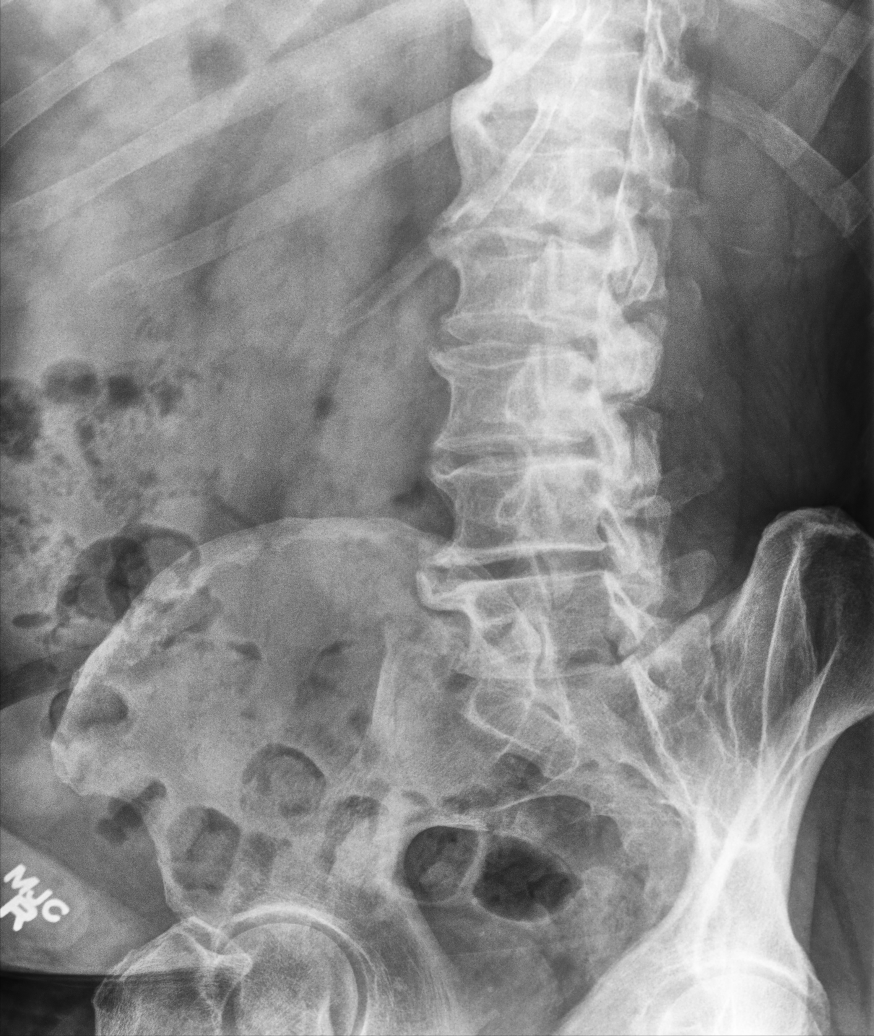

[lumbar spine oblique (2 of 2)]
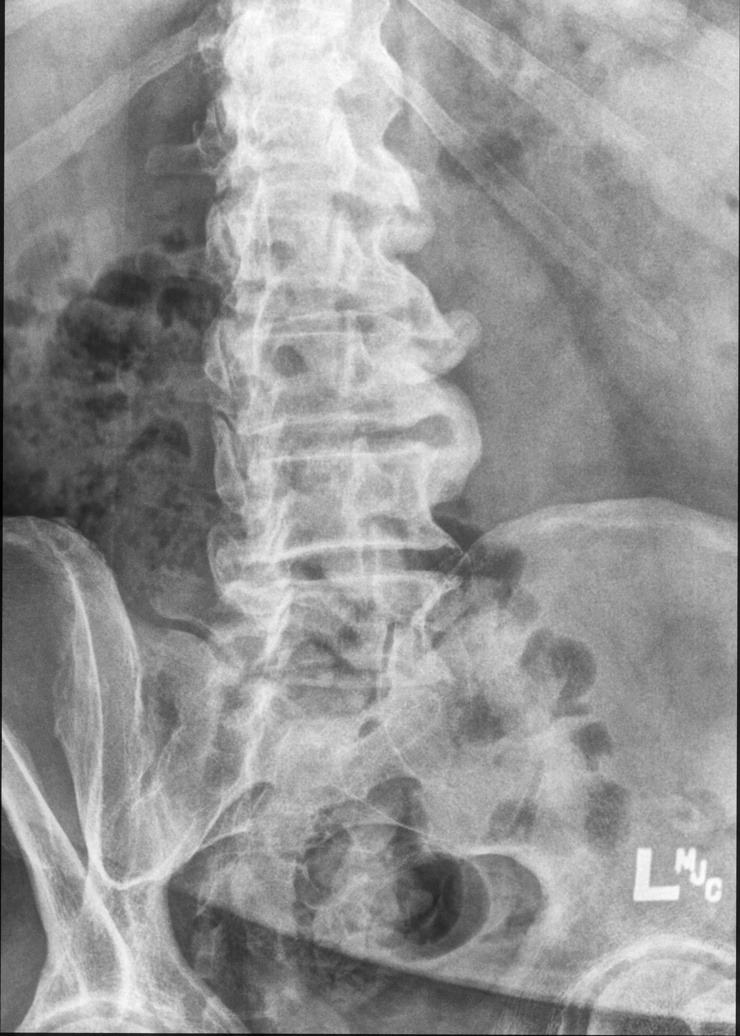

[lumbar spine lat (1 of 2)]
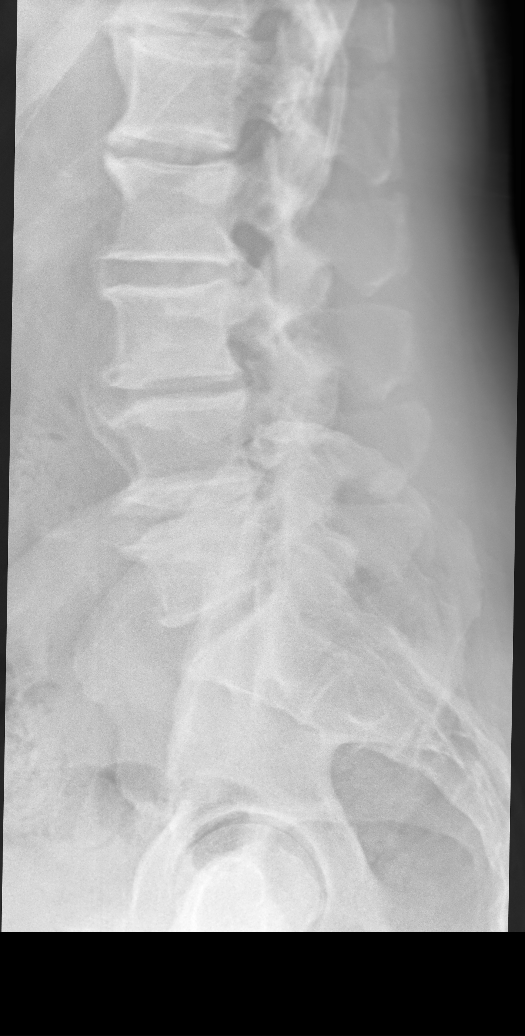

[lumbar spine lat (2 of 2)]
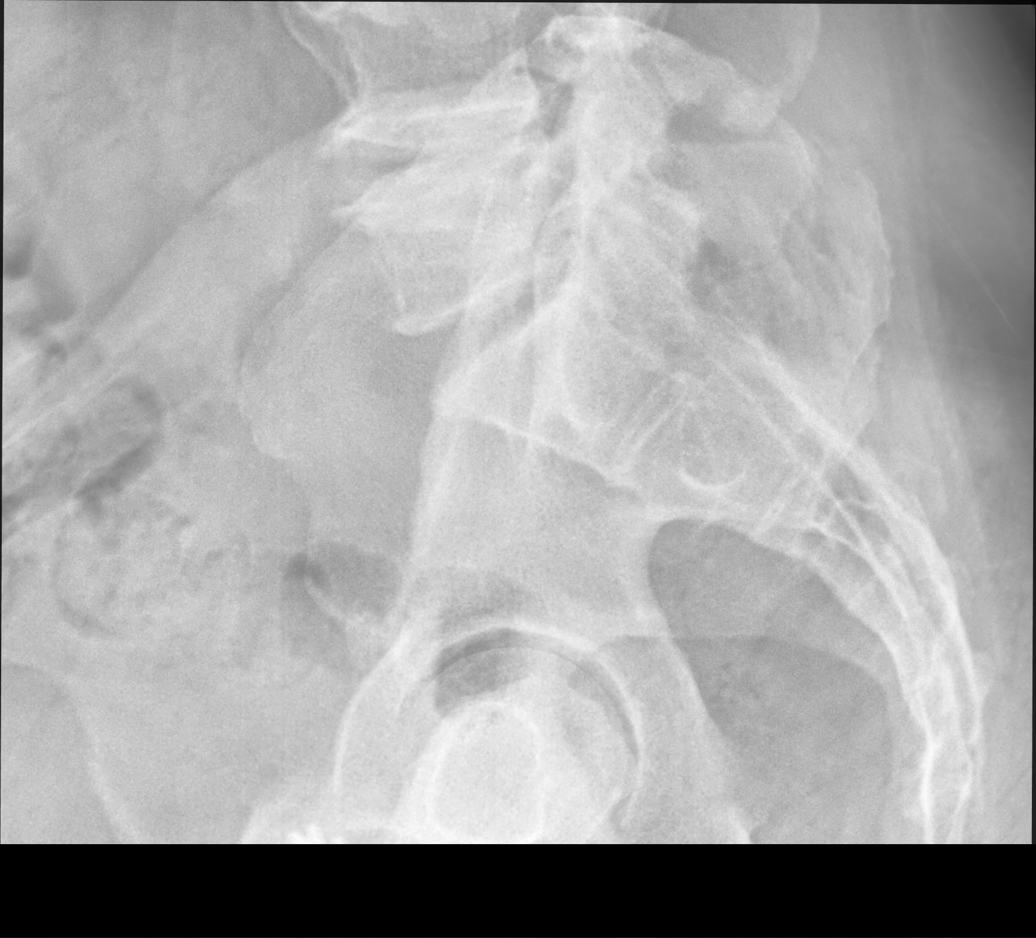

[5 of 5 positions shown; findings below may reference images not displayed]

FINDINGS: No acute fracture or subluxation.

Moderate multilevel degenerative disc disease, spondylosis and facet
arthropathy noted, greatest from L3-S1.

No focal bony lesions or spondylolysis.

LATERAL bridging osteophytes are identified.
IMPRESSION: 1. No acute abnormality
2. Moderate degenerative changes, greatest from L3-S1.

## 2018-01-07 NOTE — Patient Instructions (Signed)
BEFORE YOU LEAVE: -X-ray -follow up: 3 months, sooner pending lab results if you wish to do the osteopathic treatment  We will get an x-ray.  Depending on the findings will likely refer for physical therapy versus an orthopedic evaluation.  Also we can do some osteopathic treatments.  Please let us know if this.

## 2018-01-07 NOTE — Progress Notes (Signed)
HPI:  Using dictation device. Unfortunately this device frequently misinterprets words/phrases. Due for diabetic eye exam.  Low back pain: -Started in the summer 2019, first presented here December 2019 -he refused imaging that day, as was in a hurry to get to the airport, he opted to try home exercises and symptomatic care along with a muscle relaxer -Left low back pain, intermittent, mild, sometimes worse at night -Reports intermittently in the past and saw Ortho remotely for similar issues -Has osteoarthritis of the knees, sees Dr. Alvan Dame -Reports today:doing ok, better then in the past but persistent L low back pain, worse when first wakes up  -has seen chiropractor and massage therapist in the past and helps some, but not long lasting -Denies:radiation, weakness, numbness, bowel or bladder incontinence, malaise -Reports has never had x-rays or imaging for this problem  ROS: See pertinent positives and negatives per HPI.  Past Medical History:  Diagnosis Date  . Bipolar II disorder - Managed by Marye Round [phone]) 05/03/2013  . Diabetes mellitus   . Hyperlipidemia   . Hypertension   . Unspecified hypothyroidism 05/03/2013    Past Surgical History:  Procedure Laterality Date  . KNEE SURGERY      Family History  Problem Relation Age of Onset  . Diabetes Mother   . Hypertension Mother   . Heart disease Father   . Heart attack Father 62    SOCIAL HX: See HPI    Current Outpatient Medications:  .  amLODipine (NORVASC) 10 MG tablet, Take 1 tablet (10 mg total) by mouth daily., Disp: 90 tablet, Rfl: 2 .  gabapentin (NEURONTIN) 300 MG capsule, TAKE 1 CAPSULE BY MOUTH THREE TIMES A DAY, Disp: 270 capsule, Rfl: 0 .  hydrochlorothiazide (HYDRODIURIL) 25 MG tablet, Take 0.5 tablets (12.5 mg total) by mouth daily., Disp: 45 tablet, Rfl: 3 .  Insulin Syringe-Needle U-100 (INSULIN SYRINGE 1CC/31GX5/16") 31G X 5/16" 1 ML MISC, Use 3 needles per day, Disp: 100 each, Rfl:  3 .  loratadine (CLARITIN) 10 MG tablet, TAKE 1 TABLET EVERY DAY. **NOT COVERED UNDER INSURANCE, Disp: 90 tablet, Rfl: 1 .  losartan (COZAAR) 100 MG tablet, TAKE 1 TABLET BY MOUTH EVERY DAY, Disp: 90 tablet, Rfl: 1 .  lovastatin (MEVACOR) 20 MG tablet, TAKE 1 TABLET BY MOUTH EVERYDAY AT BEDTIME, Disp: 90 tablet, Rfl: 1 .  metFORMIN (GLUCOPHAGE) 1000 MG tablet, TAKE 1 TABLET BY MOUTH 2 TIMES DAILY WITH A MEAL., Disp: 180 tablet, Rfl: 0 .  NOVOLIN 70/30 (70-30) 100 UNIT/ML injection, INJECT 50 UNITS UNDER THE SKIN 3 TIMES DAILY AS DIRECTED (Patient taking differently: INJECT 50 UNITS UNDER THE SKIN ONCE DAILY), Disp: 120 mL, Rfl: 1 .  omeprazole (PRILOSEC) 20 MG capsule, TAKE 1 CAPSULE BY MOUTH EVERY DAY, Disp: 90 capsule, Rfl: 0 .  ONETOUCH VERIO test strip, USE AS INSTRUCTED TO CHECK BLOOD SUGAR THREE TIMES A DAY., Disp: 300 each, Rfl: 1 .  QUEtiapine (SEROQUEL) 50 MG tablet, Take 50 mg by mouth at bedtime., Disp: , Rfl:  .  sildenafil (VIAGRA) 50 MG tablet, Take 1 tablet (50 mg total) by mouth daily as needed for erectile dysfunction. Do not take more then once in 24 hours., Disp: 10 tablet, Rfl: 3 .  tiZANidine (ZANAFLEX) 2 MG tablet, Take 1 tablet (2 mg total) by mouth at bedtime as needed for muscle spasms., Disp: 30 tablet, Rfl: 0  EXAM:  Vitals:   01/07/18 1111  BP: 128/70  Pulse: 90  Temp: 98.2 F (36.8 C)  SpO2: 97%    Body mass index is 39.46 kg/m.  GENERAL: vitals reviewed and listed above, alert, oriented, appears well hydrated and in no acute distress  HEENT: atraumatic, conjunttiva clear, no obvious abnormalities on inspection of external nose and ears  NECK: no obvious masses on inspection  LUNGS: clear to auscultation bilaterally, no wheezes, rales or rhonchi, good air movement  CV: HRRR, no peripheral edema  MS: moves all extremities without noticeable abnormality Normal Gait Normal inspection of back, no obvious scoliosis or leg length descrepancy No bony  TTP Soft tissue TTP at: Left quadratus lumborum -/+ tests: neg trendelenburg,-facet loading, -SLRT, -CLRT, -FABER, -FADIR  PSYCH: pleasant and cooperative, no obvious depression or anxiety  ASSESSMENT AND PLAN:  Discussed the following assessment and plan:  Chronic left-sided low back pain without sciatica - Plan: DG Lumbar Spine Complete  -xray -PT vs ortho referral and consideration OMM pending results -Patient advised to return or notify a doctor immediately if symptoms worsen or persist or new concerns arise in the interim  Patient Instructions  BEFORE YOU LEAVE: -X-ray -follow up: 3 months, sooner pending lab results if you wish to do the osteopathic treatment  We will get an x-ray.  Depending on the findings will likely refer for physical therapy versus an orthopedic evaluation.  Also we can do some osteopathic treatments.  Please let us know if this.      Lucretia Kern, DO

## 2018-01-11 ENCOUNTER — Other Ambulatory Visit (INDEPENDENT_AMBULATORY_CARE_PROVIDER_SITE_OTHER): Payer: Medicare HMO

## 2018-01-11 DIAGNOSIS — E1169 Type 2 diabetes mellitus with other specified complication: Secondary | ICD-10-CM

## 2018-01-11 DIAGNOSIS — E669 Obesity, unspecified: Secondary | ICD-10-CM

## 2018-01-11 LAB — BASIC METABOLIC PANEL
BUN: 14 mg/dL (ref 6–23)
CALCIUM: 9.4 mg/dL (ref 8.4–10.5)
CO2: 31 meq/L (ref 19–32)
Chloride: 103 mEq/L (ref 96–112)
Creatinine, Ser: 1.33 mg/dL (ref 0.40–1.50)
GFR: 57.94 mL/min — ABNORMAL LOW (ref >60.00)
GLUCOSE: 169 mg/dL — AB (ref 70–99)
Potassium: 4.1 mEq/L (ref 3.5–5.1)
Sodium: 139 mEq/L (ref 135–145)

## 2018-01-11 LAB — HEMOGLOBIN A1C: Hgb A1c MFr Bld: 6.4 % (ref 4.6–6.5)

## 2018-01-14 ENCOUNTER — Ambulatory Visit: Payer: Medicare HMO | Admitting: Endocrinology

## 2018-01-14 VITALS — BP 142/78 | HR 107 | Ht 73.0 in | Wt 299.0 lb

## 2018-01-14 DIAGNOSIS — Z794 Long term (current) use of insulin: Secondary | ICD-10-CM | POA: Diagnosis not present

## 2018-01-14 DIAGNOSIS — E1165 Type 2 diabetes mellitus with hyperglycemia: Secondary | ICD-10-CM

## 2018-01-14 NOTE — Progress Notes (Signed)
Patient ID: Matthew Barnes, male   DOB: 1955/09/13, 62 y.o.   MRN: [id number]    Reason for Appointment: Followup for Type 2 Diabetes  Referring physician:  Maudie Mercury  History of Present Illness:          Diagnosis: Type 2 diabetes mellitus, date of diagnosis:   2011       Past history:  He was started on metformin at diagnosis and apparently did have fairly good control initially However about a year later because of poor control he was given insulin in addition. He had been taking Lantus insulin until about 2/15, up to 80 units a day However he does not think his blood sugars  Were controlled with this Because of higher A1c of 13.1% he was referred here for diabetes management in 6/15; was started on glucose monitoring and insulin was changed from low dose Levemir to Humalog mix 70 units a day He was also started on Victoza  to help with blood sugar control and facilitate weight loss on his initial consultation  Recent history:   INSULIN regimen is described as: Novolin mix 70/30, 43 units in the morning only  A1c is consistently at a good level, now 6.4    Current blood sugar patterns, management of diabetes and problems:  His blood sugars are still fairly good overall although somewhat variable  He says that he may have high readings if he is eating late at night  Again because of busy schedule he forgets to check his sugars during the day but does not feel hypoglycemic  Occasionally he will not take his morning insulin especially if the blood sugar is relatively low in the morning  He says that his diet is usually fairly good but somewhat variable if he is having some stress and depression  Asking about difficulty with weight loss but does not want to see the dietitian  Although he is trying to do some exercise he has some limitation because of knee pain  He is checking his blood sugars mostly every day with readings being labeled before or after breakfast   Oral hypoglycemic  drugs the patient is taking are: Metformin 1 g twice a day      Side effects from medications have been: None  Glucose monitoring:  done 1 times a day         Glucometer:  Verio   Blood Glucose readings from review of monitor as below   PRE-MEAL Fasting Lunch Dinner Bedtime Overall  Glucose range:  110-134      Mean/median:     127   POST-MEAL PC Breakfast PC Lunch PC Dinner  Glucose range:  136-209    Mean/median:      Previous readings:  PRE-MEAL Fasting Lunch Dinner Bedtime Overall  Glucose range: 88-142      Mean/median:      125   POST-MEAL PC Breakfast PC Lunch PC Dinner  Glucose range:  161, 186    Mean/median:        Glycemic control:   Lab Results  Component Value Date   HGBA1C 6.4 01/11/2018   HGBA1C 6.3 10/07/2017   HGBA1C 6.2 06/09/2017   Lab Results  Component Value Date   MICROALBUR 3.5 (H) 06/11/2017   LDLCALC 43 08/01/2016   CREATININE 1.33 01/11/2018    Self-care: The diet that the patient has been following is: None, unable to control portions and carbs  when depressed  Meals: 3 meals per day (dinner 6 pm-10 pm)  Exercise: Elliptical and home, of times a week        Dietician visit: Most recent: never      CDE visit: 08/2013             Weight history: baseline 295  Wt Readings from Last 3 Encounters:  01/14/18 299 lb (135.6 kg)  01/07/18 299 lb 1.6 oz (135.7 kg)  12/01/17 299 lb 9.6 oz (135.9 kg)   Lab on 01/11/2018  Component Date Value Ref Range Status  . Sodium 01/11/2018 139  135 - 145 mEq/L Final  . Potassium 01/11/2018 4.1  3.5 - 5.1 mEq/L Final  . Chloride 01/11/2018 103  96 - 112 mEq/L Final  . CO2 01/11/2018 31  19 - 32 mEq/L Final  . Glucose, Bld 01/11/2018 169* 70 - 99 mg/dL Final  . BUN 01/11/2018 14  6 - 23 mg/dL Final  . Creatinine, Ser 01/11/2018 1.33  0.40 - 1.50 mg/dL Final  . Calcium 01/11/2018 9.4  8.4 - 10.5 mg/dL Final  . GFR 01/11/2018 57.94* >60.00 mL/min Final  . Hgb A1c MFr Bld 01/11/2018 6.4  4.6  - 6.5 % Final   Glycemic Control Guidelines for People with Diabetes:Non Diabetic:  <6%Goal of Therapy: <7%Additional Action Suggested:  >8%       Allergies as of 01/14/2018      Reactions   Influenza Vaccines Swelling   Reports of swelling of the arm and then sick for 10 days      Medication List        Accurate as of 01/14/18  8:53 AM. Always use your most recent med list.          amLODipine 10 MG tablet Commonly known as:  NORVASC Take 1 tablet (10 mg total) by mouth daily.   gabapentin 300 MG capsule Commonly known as:  NEURONTIN TAKE 1 CAPSULE BY MOUTH THREE TIMES A DAY   hydrochlorothiazide 25 MG tablet Commonly known as:  HYDRODIURIL Take 0.5 tablets (12.5 mg total) by mouth daily.   INSULIN SYRINGE 1CC/31GX5/16" 31G X 5/16" 1 ML Misc Use 3 needles per day   loratadine 10 MG tablet Commonly known as:  CLARITIN TAKE 1 TABLET EVERY DAY. **NOT COVERED UNDER INSURANCE   losartan 100 MG tablet Commonly known as:  COZAAR TAKE 1 TABLET BY MOUTH EVERY DAY   lovastatin 20 MG tablet Commonly known as:  MEVACOR TAKE 1 TABLET BY MOUTH EVERYDAY AT BEDTIME   metFORMIN 1000 MG tablet Commonly known as:  GLUCOPHAGE TAKE 1 TABLET BY MOUTH 2 TIMES DAILY WITH A MEAL.   NOVOLIN 70/30 (70-30) 100 UNIT/ML injection Generic drug:  insulin NPH-regular Human INJECT 50 UNITS UNDER THE SKIN 3 TIMES DAILY AS DIRECTED   omeprazole 20 MG capsule Commonly known as:  PRILOSEC TAKE 1 CAPSULE BY MOUTH EVERY DAY   ONETOUCH VERIO test strip Generic drug:  glucose blood USE AS INSTRUCTED TO CHECK BLOOD SUGAR THREE TIMES A DAY.   QUEtiapine 50 MG tablet Commonly known as:  SEROQUEL Take 50 mg by mouth at bedtime.   sildenafil 50 MG tablet Commonly known as:  VIAGRA Take 1 tablet (50 mg total) by mouth daily as needed for erectile dysfunction. Do not take more then once in 24 hours.   tiZANidine 2 MG tablet Commonly known as:  ZANAFLEX Take 1 tablet (2 mg total) by mouth at  bedtime as needed for muscle spasms.       Allergies:  Allergies  Allergen Reactions  . Influenza  Vaccines Swelling    Reports of swelling of the arm and then sick for 10 days    Past Medical History:  Diagnosis Date  . Bipolar II disorder - Managed by Marye Round [phone]) 05/03/2013  . Diabetes mellitus   . Hyperlipidemia   . Hypertension   . Unspecified hypothyroidism 05/03/2013    Past Surgical History:  Procedure Laterality Date  . KNEE SURGERY      Family History  Problem Relation Age of Onset  . Diabetes Mother   . Hypertension Mother   . Heart disease Father   . Heart attack Father 54    Social History:  reports that he has never smoked. He has never used smokeless tobacco. He reports that he does not drink alcohol or use drugs.    Review of Systems   HYPERTENSION:  Currently taking losartan 100 mg and Norvasc 10, HCTZ 12.5 followed by PCP Blood pressure usually high when he first comes into the office visit when he is on his medically PCP so but looks a few want to add somebody else's name if they are available on epic Does not monitor at home   BP Readings from Last 3 Encounters:  01/14/18 (!) 142/78  01/07/18 128/70  12/01/17 124/80         LIPIDS: He has had marked increase in triglycerides previously.  Currently only on 20 mg lovastatin, also followed by PCP        Lab Results  Component Value Date   CHOL 126 09/01/2017   HDL 22.30 (L) 09/01/2017   LDLCALC 43 08/01/2016   LDLDIRECT 73.0 09/01/2017   TRIG 255.0 (H) 09/01/2017   CHOLHDL 6 09/01/2017       Thyroid:  Has Previously had mild hypothyroidism for a few years, likely when he was taking lithium TSH subsequently has been normal consistently without taking any levothyroxine    Lab Results  Component Value Date   TSH 1.01 09/01/2017        He has had some numbness in his feet especially on the right side, relatively better now and using gabapentin more as needed  now Using elastic stockings  He had foot exam from PCP showing:   Patchy decrease in sensation in the toes especially right and decreased on the plantar surface also   LABS:  Lab on 01/11/2018  Component Date Value Ref Range Status  . Sodium 01/11/2018 139  135 - 145 mEq/L Final  . Potassium 01/11/2018 4.1  3.5 - 5.1 mEq/L Final  . Chloride 01/11/2018 103  96 - 112 mEq/L Final  . CO2 01/11/2018 31  19 - 32 mEq/L Final  . Glucose, Bld 01/11/2018 169* 70 - 99 mg/dL Final  . BUN 01/11/2018 14  6 - 23 mg/dL Final  . Creatinine, Ser 01/11/2018 1.33  0.40 - 1.50 mg/dL Final  . Calcium 01/11/2018 9.4  8.4 - 10.5 mg/dL Final  . GFR 01/11/2018 57.94* >60.00 mL/min Final  . Hgb A1c MFr Bld 01/11/2018 6.4  4.6 - 6.5 % Final   Glycemic Control Guidelines for People with Diabetes:Non Diabetic:  <6%Goal of Therapy: <7%Additional Action Suggested:  >8%     Physical Examination:  BP (!) 142/78   Pulse (!) 107   Ht 6\' 1"  (1.854 m)   Wt 299 lb (135.6 kg)   SpO2 96%   BMI 39.45 kg/m     ASSESSMENT/PLAN:   Diabetes type 2, recent BMI under 40  See history of present illness for detailed  discussion of his current management, blood sugar patterns and problems identified  His blood sugar control is consistent with A1c below 6.5 again  He is taking insulin only in the morning and somewhat lower dose to 75 May have occasional higher readings after breakfast especially wheezing more carbohydrate but not clear if he has high readings after lunch or dinner Likely he is not completely insulin deficient as he occasionally skips his insulin However not clear if he may require mealtime coverage with his main meal in the evening at times  Offered consultation with dietitian but he thinks he can try him better on his own Also does not want to take brand-name medication such as Invokana despite potential long-term cardiovascular and renal benefits  Hypertension:  Blood pressure followed by PCP  He  refuses influenza vaccine because of previous reaction  Patient Instructions  Check blood sugars on waking up 4 days a week  Also check blood sugars about 2 hours after meals and do this after different meals by rotation  Recommended blood sugar levels on waking up are 90-130 and about 2 hours after meal is 130-160  Please bring your blood sugar monitor to each visit, thank you       Elayne Snare 01/14/2018, 8:53 AM   Note: This office note was prepared with Dragon voice recognition system technology. Any transcriptional errors that result from this process are unintentional.

## 2018-01-14 NOTE — Patient Instructions (Addendum)
Check blood sugars on waking up 4 days a week  Also check blood sugars about 2 hours after meals and do this after different meals by rotation  Recommended blood sugar levels on waking up are 90-130 and about 2 hours after meal is 130-160  Please bring your blood sugar monitor to each visit, thank you  Increase exercise

## 2018-01-19 ENCOUNTER — Encounter: Payer: Self-pay | Admitting: Physical Therapy

## 2018-01-19 ENCOUNTER — Ambulatory Visit: Payer: Medicare HMO | Attending: Family Medicine | Admitting: Physical Therapy

## 2018-01-19 ENCOUNTER — Other Ambulatory Visit: Payer: Self-pay

## 2018-01-19 DIAGNOSIS — M6281 Muscle weakness (generalized): Secondary | ICD-10-CM | POA: Insufficient documentation

## 2018-01-19 DIAGNOSIS — G8929 Other chronic pain: Secondary | ICD-10-CM

## 2018-01-19 DIAGNOSIS — M545 Low back pain, unspecified: Secondary | ICD-10-CM

## 2018-01-19 DIAGNOSIS — Z0100 Encounter for examination of eyes and vision without abnormal findings: Secondary | ICD-10-CM | POA: Diagnosis not present

## 2018-01-19 NOTE — Patient Instructions (Addendum)
Access Code: [email or web address]  URL: [email or web address]  Date: 01/19/2018  Prepared by: Ruben Im   Exercises  Hooklying Transversus Abdominis Palpation - 10 reps - 1 sets - 5 hold - 3x daily - 7x weekly  Seated Transversus Abdominis Bracing - 10 reps - 1 sets - 5 hold - 3x daily - 7x weekly  Standing Lumbar Extension - 10 reps - 1 sets - 1x daily - 7x weekly  Access Code: [email or web address]  URL: [email or web address]  Date: 01/19/2018  Prepared by: Ruben Im   Exercises  Hooklying Transversus Abdominis Palpation - 10 reps - 1 sets - 5 hold - 3x daily - 7x weekly  Seated Transversus Abdominis Bracing - 10 reps - 1 sets - 5 hold - 3x daily - 7x weekly  Standing Lumbar Extension - 10 reps - 1 sets - 1x daily - 7x weekly  Supine Hamstring Stretch with Strap - 3 reps - 1 sets - 1x daily - 7x weekly  Seated Hamstring Stretch - 3 reps - 1 sets - 30 hold - 1x daily - 7x weekly

## 2018-01-19 NOTE — Therapy (Addendum)
[facility] [address], [place], [place], [address] Phone: [phone]   Fax:  [phone]  Physical Therapy Evaluation/Discharge Summary   Patient Details  Name: Matthew Barnes MRN: [id number] Date of Birth: 1955/08/04 Referring Provider (PT): Dr. Maudie Mercury   Encounter Date: 01/19/2018  PT End of Session - 01/19/18 1920    Visit Number  1    Date for PT Re-Evaluation  03/16/18    Authorization Type  Medicare    PT Start Time  8032    PT Stop Time  1525    PT Time Calculation (min)  40 min    Activity Tolerance  Patient tolerated treatment well       Past Medical History:  Diagnosis Date  . Bipolar II disorder - Managed by Marye Round [phone]) 05/03/2013  . Diabetes mellitus   . Hyperlipidemia   . Hypertension   . Unspecified hypothyroidism 05/03/2013    Past Surgical History:  Procedure Laterality Date  . KNEE SURGERY      There were no vitals filed for this visit.   Subjective Assessment - 01/19/18 1448    Subjective  Degenerative back pain for a couple of months.  For a time it would lock up with me bent forward.   I don't know what to do about it.  I go to the chiropractor and get a massage and feels better for a couple of hours.      Pertinent History  DM, HTN     Limitations  House hold activities    How long can you sit comfortably?  as long as I want to     How long can you walk comfortably?  1 hour    Diagnostic tests  x-ray DDD    Patient Stated Goals  learn what ex's to do;  lose 50#     Currently in Pain?  Yes    Pain Score  1     Pain Location  Back    Pain Orientation  Left;Lower;Right   left > right    Pain Type  Chronic pain    Pain Onset  More than a month ago    Pain Frequency  Constant    Aggravating Factors   lying down; mornings; bending forward    Pain Relieving Factors  nothing         [facility] PT Assessment - 01/19/18 0001      Assessment   Medical Diagnosis   left sided LBP without sciatica    Referring Provider (PT)  Dr. Maudie Mercury    Onset Date/Surgical Date  --   2 months   Next MD Visit  as needed    Prior Therapy  none      Precautions   Precautions  None      Restrictions   Weight Bearing Restrictions  No      Balance Screen   Has the patient fallen in the past 6 months  No    Has the patient had a decrease in activity level because of a fear of falling?   No    Is the patient reluctant to leave their home because of a fear of falling?   No      Home Environment   Living Environment  Private residence    Type of [employer] Access  Level entry    [place]  One level      Prior Function  Vocation  Full time employment    Arts administrator    Leisure  work      Observation/Other Assessments   Focus on Therapeutic Outcomes (FOTO)   32% limitation       AROM   Overall AROM Comments  patient reports he is limited in ability to lie prone secondary to difficulty breathing    Lumbar Flexion  60    Lumbar Extension  20    Lumbar - Right Side Bend  18    Lumbar - Left Side Bend  5      Strength   Overall Strength Comments  holds breath when attempting to active transverse abdominus muscles    Right Hip Flexion  5/5    Right Hip ABduction  5/5    Left Hip Flexion  5/5    Left Hip ABduction  4+/5    Lumbar Flexion  3+/5    Lumbar Extension  4-/5      Flexibility   Hamstrings  55 degrees bil       Palpation   Palpation comment  tender left quadratus lumborum muscle       Slump test   Findings  Positive    Side  Left      Straight Leg Raise   Findings  Negative    Side   Left                Objective measurements completed on examination: See above findings.              PT Education - 01/19/18 1523    Education Details   Access Code: [email or web address] abdominal brace supine and seated;  HS stretch supine and seated    Person(s) Educated  Patient    Methods   Explanation;Demonstration;Handout    Comprehension  Returned demonstration;Verbalized understanding       PT Short Term Goals - 01/19/18 2105      PT SHORT TERM GOAL #1   Title  The patient will demonstrate understanding of frequent change of position, sitting with support and initial HEP    Time  4    Period  Weeks    Status  New    Target Date  02/16/18      PT SHORT TERM GOAL #2   Title  The patient will report a 30% reduction in back pain in with usual ADLs including lying down, in the mornings and with bending forward    Time  4    Period  Weeks      PT SHORT TERM GOAL #3   Title  Improved HS length to 60 degrees bilaterally     Time  4    Period  Weeks    Status  New        PT Long Term Goals - 01/19/18 2108      PT LONG TERM GOAL #1   Title  The patient will be independent in a HEP/ needed for ROM, flexibility and core strength     Time  8    Period  Weeks    Status  New    Target Date  03/16/18      PT LONG TERM GOAL #2   Title  The patient will report a 60% reduction in back pain with lying down, mornings and with bending over    Time  8    Period  Weeks    Status  New      PT  LONG TERM GOAL #3   Title  Improved lumbar ROM in planes:  flexion to 70, extension 25, right and left sidebending to 20 degrees needed for improved mobility for ADLs    Time  8    Period  Weeks    Status  New      PT LONG TERM GOAL #4   Title  Improved lumbo/pelvic/hip core strength to grossly 4+/5 needed for improved tolerance of walking, standing    Time  8    Period  Weeks    Status  New      PT LONG TERM GOAL #5   Title  FOTO functional outcome score improved from 32% limitation to 24% indicating improved function with less pain     Time  8    Period  Weeks    Status  New             Plan - 01/19/18 1920    Clinical Impression Statement  The patient reports a 2 month history of LBP.  He reports initially his back would lock up in the forward bent position but  this has improved.  He reports increased pain with lying down, in the mornings and with bending forward.   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  Decreased HS length to 55 degrees bilaterally.  Decreased activation of transverse abdominus muscles with tendency to hold his breath.  Trunk and hip strength 4-/5 to 4+/5.  + Slump test, -SLR test;  Tender left quadratus lumborum muscle.  His FOTO functional outcome score indicates a mild to moderate level of self perceived limitation.      History and Personal Factors relevant to plan of care:  co-morbidities including OA knees, Diabetes with peripheral neuropathy, HTN; bipolar disorder    Clinical Presentation  Stable    Clinical Decision Making  Low    Rehab Potential  Good    Clinical Impairments Affecting Rehab Potential  see co-morbidities above;  able to lie prone for short periods of time secondary to patient report of difficulty breathing     PT Frequency  1x / week    PT Duration  8 weeks    PT Treatment/Interventions  ADLs/Self Care Home Management;Electrical Stimulation;Cryotherapy;Moist Heat;Traction;Therapeutic exercise;Therapeutic activities;Neuromuscular re-education;Patient/family education;Manual techniques;Dry needling;Taping    PT Next Visit Plan  establish HEP and gym program;  review initial HEP (transverese abdominus activation) and HS stretching); add QL stretches to HEP;   progress abdominal brace series;  supine, sidelying and seated core ex;  UBE and or Nu-step;  seated rows and lat bar;  limit force or compression on knees ;  patient wants to focus on ex first but open to Dn of QL as needed    PT Home Exercise Plan   Access Code: [email or web address]     Consulted and Agree with Plan of Care  Patient       Patient will  benefit from skilled therapeutic intervention in order to improve the following deficits and impairments:  Pain, Increased fascial restricitons, Increased muscle spasms, Decreased range of motion, Decreased strength, Obesity  Visit Diagnosis: Chronic left-sided low back pain without sciatica - Plan: PT plan of care cert/re-cert  Muscle weakness (generalized) - Plan: PT plan of care cert/re-cert   PHYSICAL THERAPY DISCHARGE SUMMARY  Visits from Start of Care: 1  Current functional level related to goals / functional outcomes: The patient did not return to PT.  He felt he needed to "follow up with the doctor" about his pain.   Remaining deficits: As above   Education / Equipment: Initial HEP Plan: Patient agrees to discharge.  Patient goals were not met. Patient is being discharged due to the patient's request.  ?????       Problem List Patient Active Problem List   Diagnosis Date Noted  . Leg edema 10/27/2016  . Bipolar affective, mixed ([place]) 04/29/2016  . Esophageal reflux 01/17/2014  . DM type 2 with diabetic peripheral neuropathy ([place]) 08/31/2013  . Hypertension associated with diabetes ([place]) 05/03/2013  . Hyperlipidemia 05/03/2013   Ruben Im, PT 01/19/18 9:25 PM Phone: [phone] Fax: [phone] Alvera Singh 01/19/2018, 9:25 PM  [facility] [address] [place], [place], [address] Phone: [phone]   Fax:  [phone]  Name: FARIS COOLMAN MRN: [id number] Date of Birth: [birth year]

## 2018-01-25 ENCOUNTER — Ambulatory Visit: Payer: Medicare HMO | Admitting: Physical Therapy

## 2018-01-27 DIAGNOSIS — R69 Illness, unspecified: Secondary | ICD-10-CM | POA: Diagnosis not present

## 2018-02-04 ENCOUNTER — Ambulatory Visit: Payer: Medicare HMO | Admitting: Physical Therapy

## 2018-02-12 ENCOUNTER — Encounter: Payer: Medicare HMO | Admitting: Physical Therapy

## 2018-02-23 ENCOUNTER — Ambulatory Visit: Payer: Self-pay

## 2018-02-23 NOTE — Telephone Encounter (Signed)
Pt. Called with c/o intermittent bilateral chest pain x 2 weeks.  Stated the pain feels like an "ache."  Reported the pain duration varies, and seems to be more noticeable as his work day progresses.  Denied radiation of pain   Stated "I have some shortness of breath if I walk fast."  Reported moderate discomfort in left chest at this time, but occurs in both the right and left.  Denied nausea, sweats, or dizziness.  Reported he feels like he has some chest congestion.  Denied cough.  Denied fever/ chills. Questioned pt. if his pain worsened, that prompted him to call today, since it has been occurring x 2 weeks?  Stated "it's a lingering pain, and I just thought I should get this checked."    Called [facility]; spoke with Rachel Bo; per Dr. Maudie Mercury, if pt. Thinks this is his heart, he should go to the ER, and if he thinks this is more chest congestion with shortness of breath, then he can be evaluated in office.    Discussed with pt.  He stated he thinks his symptoms could be related to chest congestion.  PCP appt. No longer available today.  Offered another provider appt. Today; pt. Declined, and stated he is scheduled to work this afternoon.  Appt. scheduled 11/27 @ 8:15 AM, with Dr. Jerilee Hoh.  Pt. was advised if symptoms worsen, to go straight to the ER.  Verb. understanding; agreed with plan.              Reason for Disposition . Chest pain lasts > 5 minutes (Exceptions: chest pain occurring > 3 days ago and now asymptomatic; same as previously diagnosed heartburn and has accompanying sour taste in mouth)  Answer Assessment - Initial Assessment Questions 1. LOCATION: "Where does it hurt?"       Bilateral chest pain  2. RADIATION: "Does the pain go anywhere else?" (e.g., into neck, jaw, arms, back)     Denied radiation  3. ONSET: "When did the chest pain begin?" (Minutes, hours or days)      About 2 weeks  4. PATTERN "Does the pain come and go, or has it been constant since it started?"  "Does it get worse  with exertion?"      Comes and goes 5. DURATION: "How long does it last" (e.g., seconds, minutes, hours)    It varies; it aches, and can last longer when working  6. SEVERITY: "How bad is the pain?"  (e.g., Scale 1-10; mild, moderate, or severe)    - MILD (1-3): doesn't interfere with normal activities     - MODERATE (4-7): interferes with normal activities or awakens from sleep    - SEVERE (8-10): excruciating pain, unable to do any normal activities       Moderate; more on left side of chest at present  7. CARDIAC RISK FACTORS: "Do you have any history of heart problems or risk factors for heart disease?" (e.g., prior heart attack, angina; high blood pressure, diabetes, being overweight, high cholesterol, smoking, or strong family history of heart disease)     Denied any heart history.  Hx of HTN, DM, high cholesterol; family hx with father having MI and TIA 8. PULMONARY RISK FACTORS: "Do you have any history of lung disease?"  (e.g., blood clots in lung, asthma, emphysema, birth control pills)     Denied the above  9. CAUSE: "What do you think is causing the chest pain?"     Unknown  10. OTHER SYMPTOMS: "Do you  have any other symptoms?" (e.g., dizziness, nausea, vomiting, sweating, fever, difficulty breathing, cough)      Gets short of breath with the pain; denied sweating or nausea; denied dizziness,  C/o chest congestion; denied fever / chills  Protocols used: CHEST PAIN-A-AH

## 2018-02-23 NOTE — Telephone Encounter (Signed)
Noted  

## 2018-02-24 ENCOUNTER — Ambulatory Visit: Payer: Medicare HMO | Admitting: Internal Medicine

## 2018-03-10 ENCOUNTER — Other Ambulatory Visit: Payer: Self-pay | Admitting: Family Medicine

## 2018-03-22 NOTE — Progress Notes (Signed)
HPI:  Using dictation device. Unfortunately this device frequently misinterprets words/phrases.  Matthew Barnes is a pleasant 62 yo here for weight concerns and chest pain. -wanted to check weight because he thought he lost weight - seeing psych and depression worse in the winter -weight 10/118 299, weight 10/19 299, wt today 299 -cp intermittent x 3 months, reports he had last winter too and thought was depression, has cp substernal and dyspnea with exertion mainly, sometimes at rest too. No cough, fever, malaise, wheezing, dizziness, syncope, increased swelling, palpitations. Not worsening. No symptoms currently. -PMH bipolar disorder managed by psych, diabetes managed by endo, hyperlipidemia, hypertension and hypothyroidism - sees endo for this as well  ROS: See pertinent positives and negatives per HPI.  Past Medical History:  Diagnosis Date  . Bipolar II disorder - Managed by Marye Round [phone]) 05/03/2013  . Diabetes mellitus   . Hyperlipidemia   . Hypertension   . Unspecified hypothyroidism 05/03/2013    Past Surgical History:  Procedure Laterality Date  . KNEE SURGERY      Family History  Problem Relation Age of Onset  . Diabetes Mother   . Hypertension Mother   . Heart disease Father   . Heart attack Father 46    SOCIAL HX: see hpi   Current Outpatient Medications:  .  amLODipine (NORVASC) 10 MG tablet, Take 1 tablet (10 mg total) by mouth daily., Disp: 90 tablet, Rfl: 2 .  gabapentin (NEURONTIN) 300 MG capsule, TAKE 1 CAPSULE BY MOUTH THREE TIMES A DAY, Disp: 270 capsule, Rfl: 0 .  hydrochlorothiazide (HYDRODIURIL) 25 MG tablet, Take 0.5 tablets (12.5 mg total) by mouth daily., Disp: 45 tablet, Rfl: 3 .  Insulin Syringe-Needle U-100 (INSULIN SYRINGE 1CC/31GX5/16") 31G X 5/16" 1 ML MISC, Use 3 needles per day, Disp: 100 each, Rfl: 3 .  loratadine (CLARITIN) 10 MG tablet, TAKE 1 TABLET EVERY DAY. **NOT COVERED UNDER INSURANCE, Disp: 90 tablet, Rfl: 1 .   losartan (COZAAR) 100 MG tablet, TAKE 1 TABLET BY MOUTH EVERY DAY, Disp: 90 tablet, Rfl: 1 .  lovastatin (MEVACOR) 20 MG tablet, TAKE 1 TABLET BY MOUTH EVERYDAY AT BEDTIME, Disp: 90 tablet, Rfl: 1 .  metFORMIN (GLUCOPHAGE) 1000 MG tablet, TAKE 1 TABLET BY MOUTH 2 TIMES DAILY WITH A MEAL., Disp: 180 tablet, Rfl: 0 .  NOVOLIN 70/30 (70-30) 100 UNIT/ML injection, INJECT 50 UNITS UNDER THE SKIN 3 TIMES DAILY AS DIRECTED (Patient taking differently: Inject 42 units under the skin once daily), Disp: 120 mL, Rfl: 1 .  omeprazole (PRILOSEC) 20 MG capsule, TAKE 1 CAPSULE BY MOUTH EVERY DAY, Disp: 30 capsule, Rfl: 0 .  ONETOUCH VERIO test strip, USE AS INSTRUCTED TO CHECK BLOOD SUGAR THREE TIMES A DAY., Disp: 300 each, Rfl: 1 .  QUEtiapine (SEROQUEL) 50 MG tablet, Take 50 mg by mouth at bedtime., Disp: , Rfl:  .  sildenafil (VIAGRA) 50 MG tablet, Take 1 tablet (50 mg total) by mouth daily as needed for erectile dysfunction. Do not take more then once in 24 hours., Disp: 10 tablet, Rfl: 3 .  tiZANidine (ZANAFLEX) 2 MG tablet, Take 1 tablet (2 mg total) by mouth at bedtime as needed for muscle spasms., Disp: 30 tablet, Rfl: 0  EXAM:  Vitals:   03/23/18 0929  BP: 100/72  Pulse: 94  Temp: 98 F (36.7 C)  SpO2: 95%    Body mass index is 39.46 kg/m.  GENERAL: vitals reviewed and listed above, alert, oriented, appears well hydrated and in no  acute distress  HEENT: atraumatic, conjunttiva clear, no obvious abnormalities on inspection of external nose and ears  NECK: no obvious masses on inspection  LUNGS: clear to auscultation bilaterally, no wheezes, rales or rhonchi, good air movement  CV: HRRR, no peripheral edema  MS: moves all extremities without noticeable abnormality  PSYCH: pleasant and cooperative, no obvious depression or anxiety  ASSESSMENT AND PLAN:  Discussed the following assessment and plan:  Chest pain, unspecified type - Plan: EKG 12-Lead, TSH, Basic metabolic panel, CBC, DG  Chest 2 View  Exertional chest pain - Plan: Ambulatory referral to Cardiology  Exertional dyspnea - Plan: Ambulatory referral to Cardiology  Depression  Concerns about weight  -weight is stable -we discussed possible serious and likely etiologies, workup and treatment, treatment risks and return precautions for the chest pain. He adamantly refuses ER evaluation as reports has been going on for months and not worse.  -after this discussion, Matthew Barnes opted for tsh, ekg (NSR with a few ectopic beats), cxr and urgent cardiology referral. He agreed to go straight to the hospital if any symptoms prior to seeing cardiology. -follow up advised her in 1-2 months, sooner as needed -advised psych care for depression and emergency precautions. -of course, we advised Matthew Barnes  to return or notify a doctor immediately if symptoms worsen or persist or new concerns arise.  Patient Instructions  BEFORE YOU LEAVE: -xray -labs -follow up: 1-2 months  SEE CARDIOLOGIST PER THE APPOINTMENT DETAILS WE GAVE YOU. DO NOT MISSED THIS APPOINTMENT. ARRIVE EARLY.  IN THE INTERIM IF ANY CHEST PAIN OR DIFFICULTY BREATHING SEEK EMERGENCY CARE IMMEDIATELY.Call 911.  See your psychiatrist about depression. Seek emergency care if any severe depression or thoughts of hurting yourself. Call 911.     Lucretia Kern, DO

## 2018-03-23 ENCOUNTER — Ambulatory Visit (INDEPENDENT_AMBULATORY_CARE_PROVIDER_SITE_OTHER): Payer: Medicare HMO

## 2018-03-23 ENCOUNTER — Ambulatory Visit (INDEPENDENT_AMBULATORY_CARE_PROVIDER_SITE_OTHER): Payer: Medicare HMO | Admitting: Family Medicine

## 2018-03-23 ENCOUNTER — Encounter: Payer: Self-pay | Admitting: Family Medicine

## 2018-03-23 VITALS — BP 100/72 | HR 94 | Temp 98.0°F | Ht 73.0 in | Wt 299.1 lb

## 2018-03-23 DIAGNOSIS — R079 Chest pain, unspecified: Secondary | ICD-10-CM

## 2018-03-23 DIAGNOSIS — R0600 Dyspnea, unspecified: Secondary | ICD-10-CM

## 2018-03-23 DIAGNOSIS — R0609 Other forms of dyspnea: Secondary | ICD-10-CM | POA: Diagnosis not present

## 2018-03-23 LAB — CBC
HEMATOCRIT: 43.7 % (ref 39.0–52.0)
Hemoglobin: 14.6 g/dL (ref 13.0–17.0)
MCHC: 33.4 g/dL (ref 30.0–36.0)
MCV: 80.3 fl (ref 78.0–100.0)
PLATELETS: 119 10*3/uL — AB (ref 150.0–400.0)
RBC: 5.43 Mil/uL (ref 4.22–5.81)
RDW: 13.6 % (ref 11.5–15.5)
WBC: 4.5 10*3/uL (ref 4.0–10.5)

## 2018-03-23 LAB — BASIC METABOLIC PANEL
BUN: 14 mg/dL (ref 6–23)
CHLORIDE: 104 meq/L (ref 96–112)
CO2: 30 meq/L (ref 19–32)
Calcium: 9.2 mg/dL (ref 8.4–10.5)
Creatinine, Ser: 1.2 mg/dL (ref 0.40–1.50)
GFR: 65.2 mL/min (ref >60.00)
Glucose, Bld: 106 mg/dL — ABNORMAL HIGH (ref 70–99)
Potassium: 4.1 mEq/L (ref 3.5–5.1)
SODIUM: 140 meq/L (ref 135–145)

## 2018-03-23 LAB — TSH: TSH: 1.53 u[IU]/mL (ref 0.35–4.50)

## 2018-03-23 IMAGING — DX DG CHEST 2V
2 series · 2 of 2 positions shown · non-contrast
Comparison: [DATE]

CLINICAL DATA: Chest pain

EXAM:
CHEST - 2 VIEW

[chest pa]
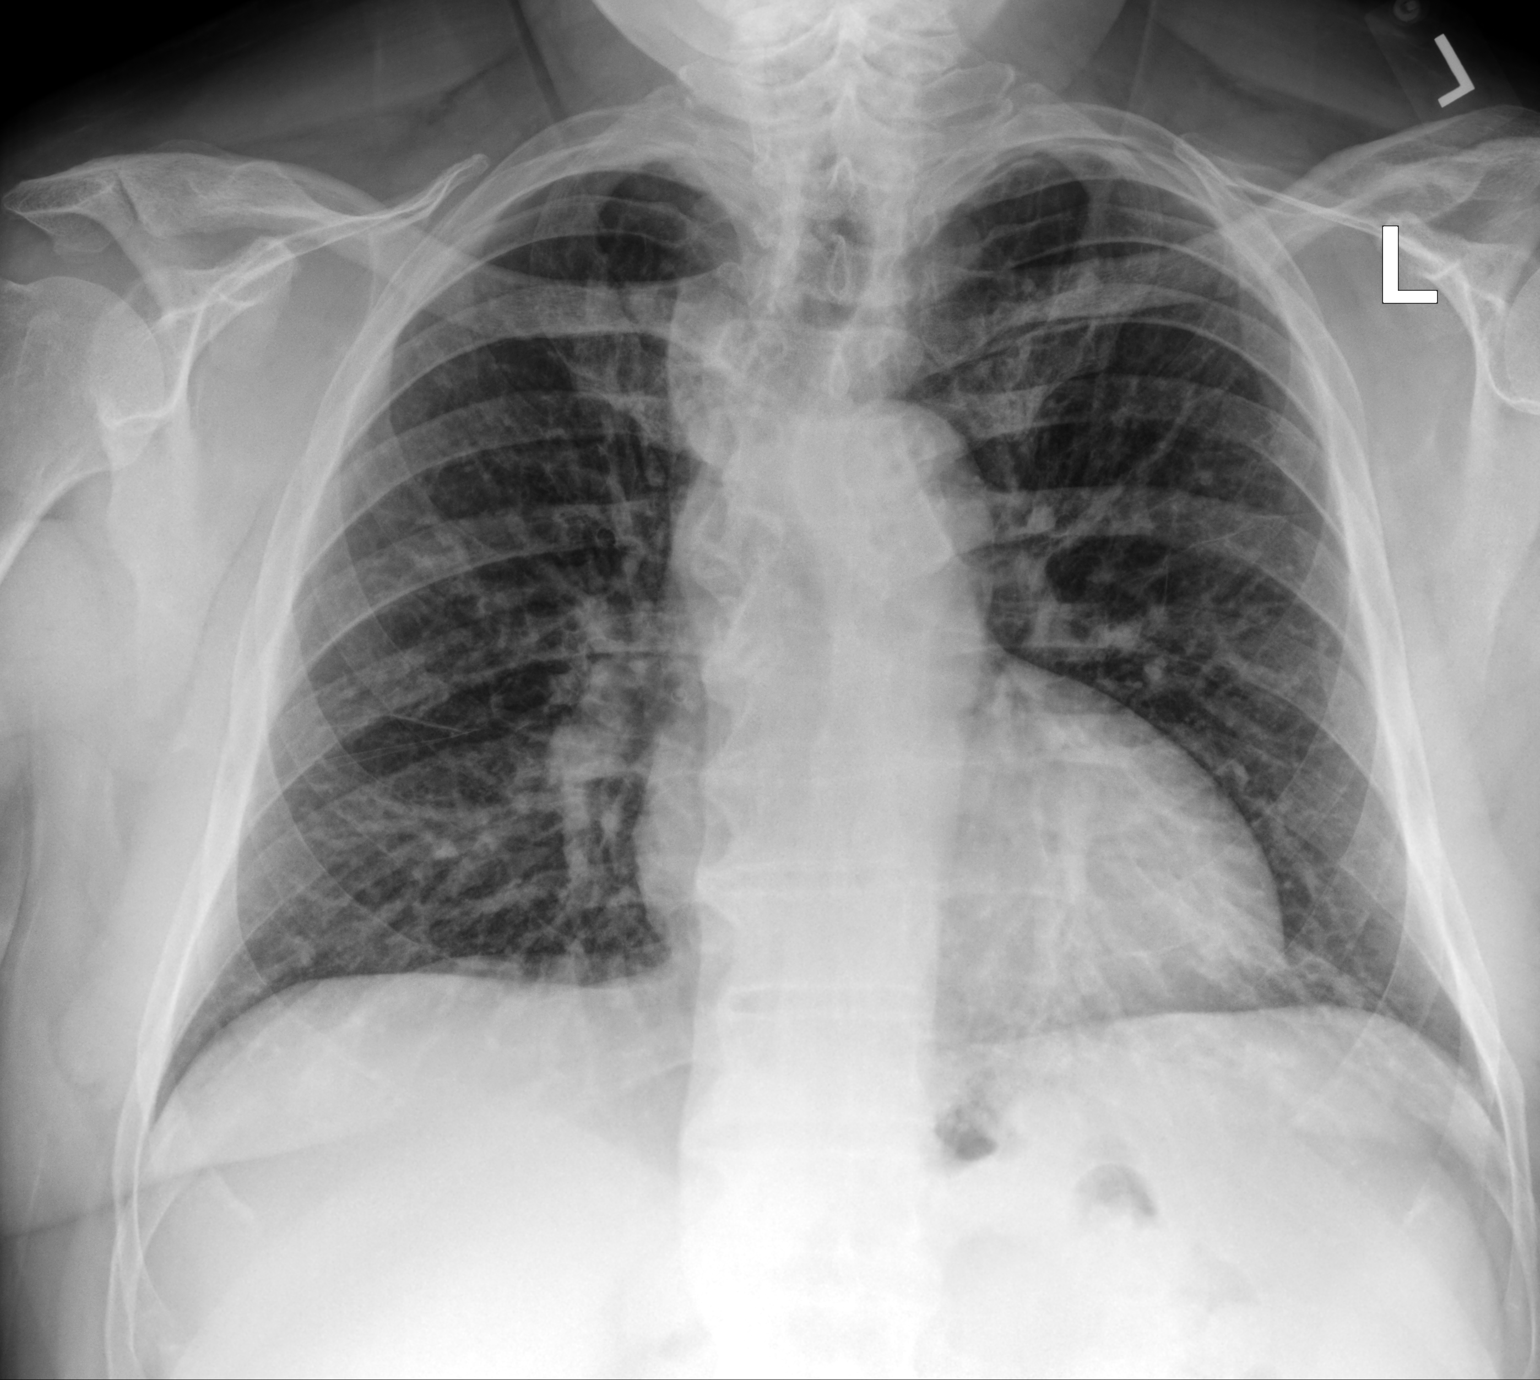

[chest lat]
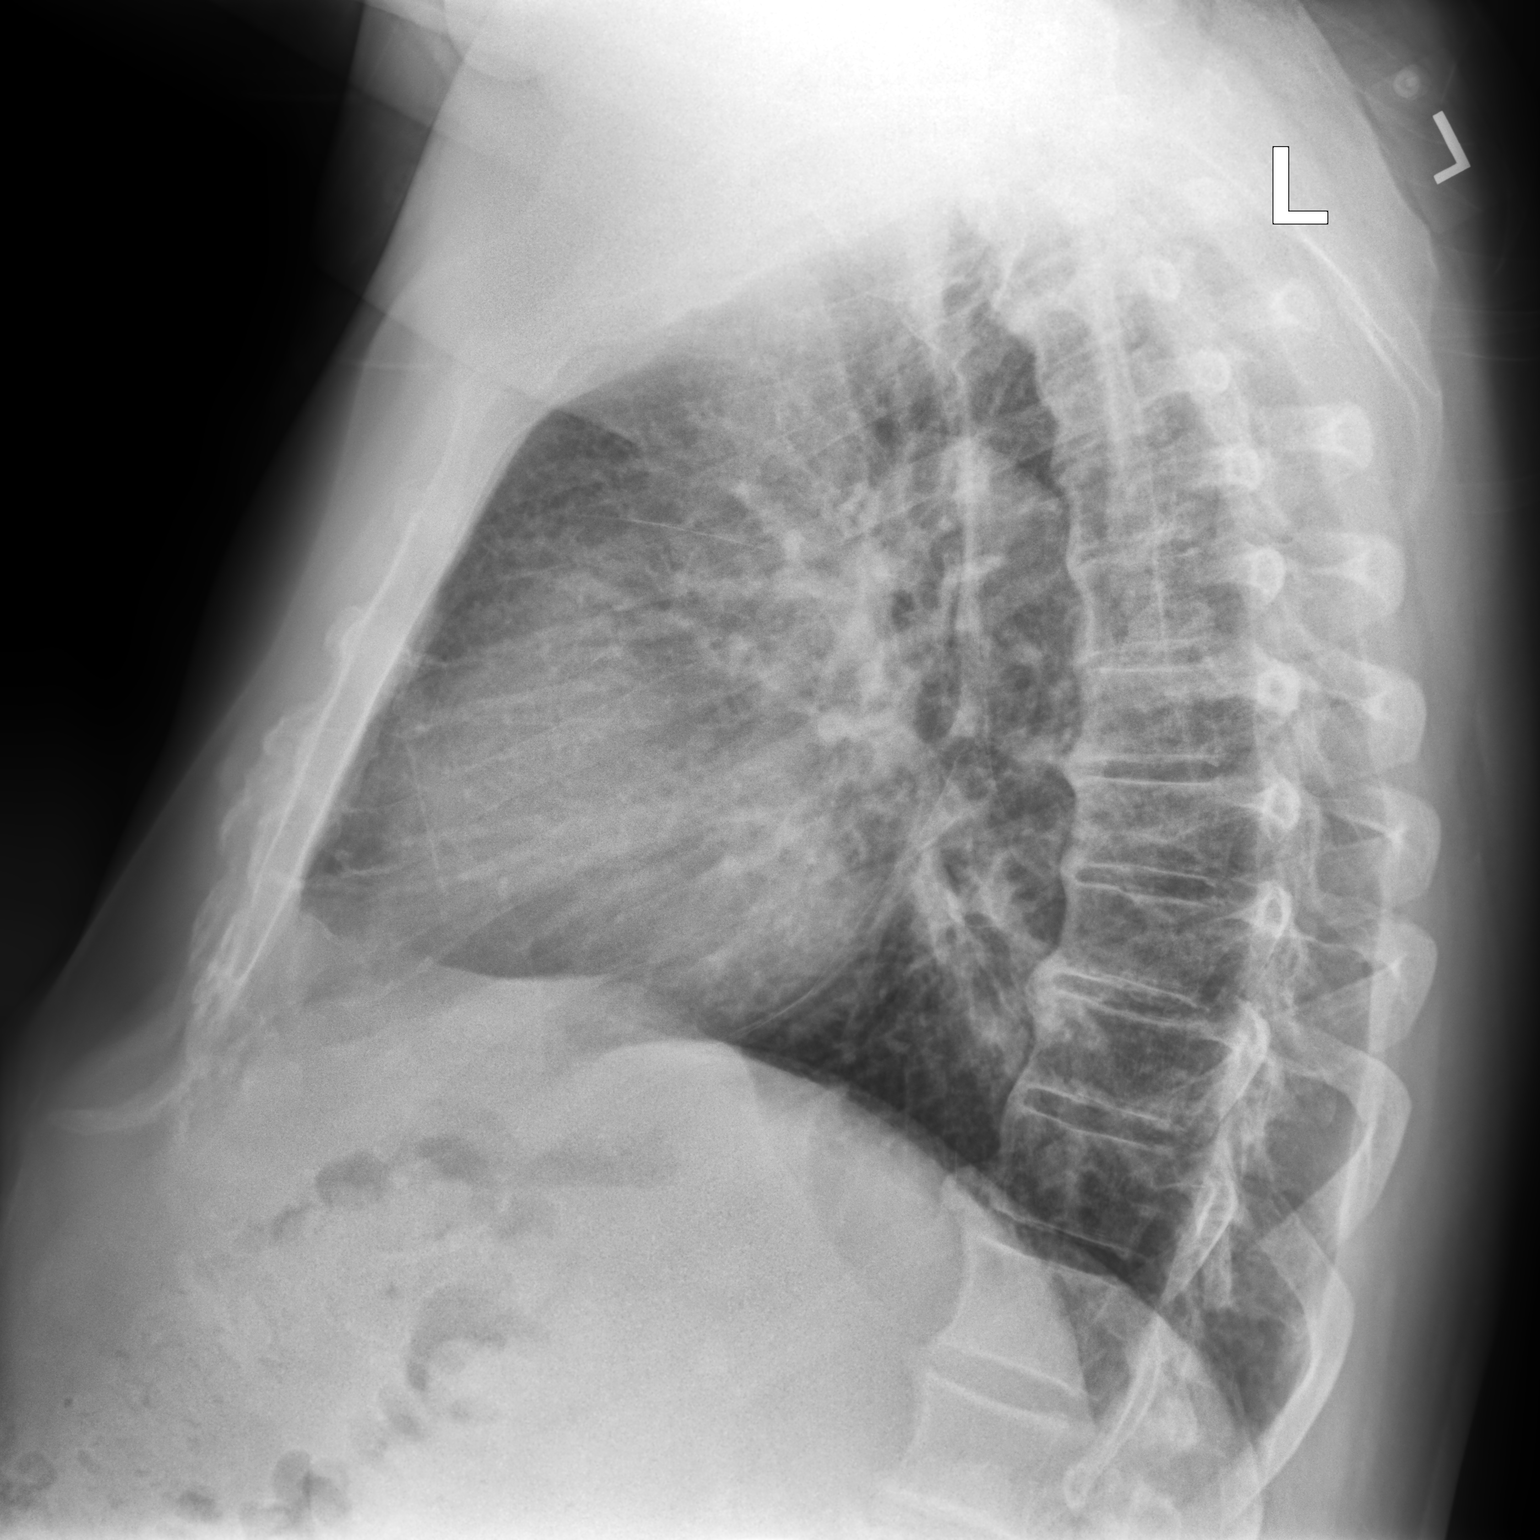

[2 of 2 positions shown; findings below may reference images not displayed]

FINDINGS: Heart is upper limits normal in size. No confluent airspace
opacities or effusions. No acute bony abnormality. Degenerative
spurring anteriorly throughout the thoracic spine.
IMPRESSION: No active cardiopulmonary disease.

## 2018-03-23 NOTE — Addendum Note (Signed)
Addended by: Gwynne Edinger on: 03/23/2018 10:06 AM   Modules accepted: Orders

## 2018-03-23 NOTE — Patient Instructions (Addendum)
BEFORE YOU LEAVE: -xray -labs -follow up: 1-2 months  SEE CARDIOLOGIST PER THE APPOINTMENT DETAILS WE GAVE YOU. DO NOT MISSED THIS APPOINTMENT. ARRIVE EARLY.  IN THE INTERIM IF ANY CHEST PAIN OR DIFFICULTY BREATHING SEEK EMERGENCY CARE IMMEDIATELY.Call 911.  See your psychiatrist about depression. Seek emergency care if any severe depression or thoughts of hurting yourself. Call 911.

## 2018-03-26 ENCOUNTER — Encounter: Payer: Self-pay | Admitting: Cardiovascular Disease

## 2018-03-26 ENCOUNTER — Ambulatory Visit (INDEPENDENT_AMBULATORY_CARE_PROVIDER_SITE_OTHER): Payer: Medicare HMO | Admitting: Cardiovascular Disease

## 2018-03-26 DIAGNOSIS — R0789 Other chest pain: Secondary | ICD-10-CM

## 2018-03-26 DIAGNOSIS — I10 Essential (primary) hypertension: Secondary | ICD-10-CM | POA: Diagnosis not present

## 2018-03-26 DIAGNOSIS — R079 Chest pain, unspecified: Secondary | ICD-10-CM

## 2018-03-26 DIAGNOSIS — E1159 Type 2 diabetes mellitus with other circulatory complications: Secondary | ICD-10-CM | POA: Diagnosis not present

## 2018-03-26 DIAGNOSIS — E785 Hyperlipidemia, unspecified: Secondary | ICD-10-CM

## 2018-03-26 DIAGNOSIS — R0609 Other forms of dyspnea: Secondary | ICD-10-CM

## 2018-03-26 DIAGNOSIS — R0600 Dyspnea, unspecified: Secondary | ICD-10-CM

## 2018-03-26 DIAGNOSIS — I152 Hypertension secondary to endocrine disorders: Secondary | ICD-10-CM

## 2018-03-26 DIAGNOSIS — Z8249 Family history of ischemic heart disease and other diseases of the circulatory system: Secondary | ICD-10-CM | POA: Insufficient documentation

## 2018-03-26 NOTE — Assessment & Plan Note (Signed)
History of atypical chest pain that occurs on a daily basis for the last several months sound somewhat atypical.  He does have a strong family history for heart disease as well as treated hypertension, diabetes and hyperlipidemia.  I am going to get an exercise Myoview stress test to further evaluate.

## 2018-03-26 NOTE — Progress Notes (Signed)
03/26/2018 Matthew Barnes   1955-10-23  [id number]  Primary Physician Lucretia Kern, DO Primary Cardiologist: Lorretta Harp MD Lupe Carney, [place]  HPI:  Matthew Barnes is a 62 y.o. moderately overweight divorced Caucasian male with no children who works as an Mining engineer.  He was referred by Dr. Rodena Goldmann for cardiovascular dilation because of chest pain and shortness of breath.  His risk factors include treated hypertension, diabetes and hyperlipidemia as well as strong family history of heart disease with a mother and brother both of whom had stents.  He is never had a heart attack or stroke.  Over the last several months he is noticed daily chest pain with increasing dyspnea on exertion.  The pain somewhat is associated with whether he is depressed.  He has bipolar disorder.   Current Meds  Medication Sig  . amLODipine (NORVASC) 10 MG tablet Take 1 tablet (10 mg total) by mouth daily.  Marland Kitchen gabapentin (NEURONTIN) 300 MG capsule TAKE 1 CAPSULE BY MOUTH THREE TIMES A DAY  . hydrochlorothiazide (HYDRODIURIL) 25 MG tablet Take 0.5 tablets (12.5 mg total) by mouth daily.  . Insulin Syringe-Needle U-100 (INSULIN SYRINGE 1CC/31GX5/16") 31G X 5/16" 1 ML MISC Use 3 needles per day  . loratadine (CLARITIN) 10 MG tablet TAKE 1 TABLET EVERY DAY. **NOT COVERED UNDER INSURANCE  . losartan (COZAAR) 100 MG tablet TAKE 1 TABLET BY MOUTH EVERY DAY  . lovastatin (MEVACOR) 20 MG tablet TAKE 1 TABLET BY MOUTH EVERYDAY AT BEDTIME  . metFORMIN (GLUCOPHAGE) 1000 MG tablet TAKE 1 TABLET BY MOUTH 2 TIMES DAILY WITH A MEAL.  Marland Kitchen NOVOLIN 70/30 (70-30) 100 UNIT/ML injection INJECT 50 UNITS UNDER THE SKIN 3 TIMES DAILY AS DIRECTED (Patient taking differently: Inject 42 units under the skin once daily)  . omeprazole (PRILOSEC) 20 MG capsule TAKE 1 CAPSULE BY MOUTH EVERY DAY  . ONETOUCH VERIO test strip USE AS INSTRUCTED TO CHECK BLOOD SUGAR THREE TIMES A DAY.  . QUEtiapine (SEROQUEL) 50 MG tablet Take 50 mg by  mouth at bedtime.  . sildenafil (VIAGRA) 50 MG tablet Take 1 tablet (50 mg total) by mouth daily as needed for erectile dysfunction. Do not take more then once in 24 hours.  Marland Kitchen tiZANidine (ZANAFLEX) 2 MG tablet Take 1 tablet (2 mg total) by mouth at bedtime as needed for muscle spasms.     Allergies  Allergen Reactions  . Influenza Vaccines Swelling    Reports of swelling of the arm and then sick for 10 days    Social History   Socioeconomic History  . Marital status: Single    Spouse name: Not on file  . Number of children: Not on file  . Years of education: Not on file  . Highest education level: Not on file  Occupational History  . Not on file  Social Needs  . Financial resource strain: Not on file  . Food insecurity:    Worry: Not on file    Inability: Not on file  . Transportation needs:    Medical: Not on file    Non-medical: Not on file  Tobacco Use  . Smoking status: Never Smoker  . Smokeless tobacco: Never Used  Substance and Sexual Activity  . Alcohol use: No  . Drug use: No  . Sexual activity: Never  Lifestyle  . Physical activity:    Days per week: Not on file    Minutes per session: Not on file  . Stress:  Not on file  Relationships  . Social connections:    Talks on phone: Not on file    Gets together: Not on file    Attends religious service: Not on file    Active member of club or organization: Not on file    Attends meetings of clubs or organizations: Not on file    Relationship status: Not on file  . Intimate partner violence:    Fear of current or ex partner: Not on file    Emotionally abused: Not on file    Physically abused: Not on file    Forced sexual activity: Not on file  Other Topics Concern  . Not on file  Social History Narrative   Work or School: on disability for knee arthritis      Home Situation: lives alone      Spiritual Beliefs:Christian      Lifestyle:no regular exercising; diet not great              Review of  Systems: General: negative for chills, fever, night sweats or weight changes.  Cardiovascular: negative for chest pain, dyspnea on exertion, edema, orthopnea, palpitations, paroxysmal nocturnal dyspnea or shortness of breath Dermatological: negative for rash Respiratory: negative for cough or wheezing Urologic: negative for hematuria Abdominal: negative for nausea, vomiting, diarrhea, bright red blood per rectum, melena, or hematemesis Neurologic: negative for visual changes, syncope, or dizziness All other systems reviewed and are otherwise negative except as noted above.    Blood pressure (!) 150/90, pulse 91, height 6\' 2"  (1.88 m), weight 298 lb (135.2 kg).  General appearance: alert and no distress Neck: no adenopathy, no carotid bruit, no JVD, supple, symmetrical, trachea midline and thyroid not enlarged, symmetric, no tenderness/mass/nodules Lungs: clear to auscultation bilaterally Heart: regular rate and rhythm, S1, S2 normal, no murmur, click, rub or gallop Extremities: extremities normal, atraumatic, no cyanosis or edema Pulses: 2+ and symmetric Skin: Skin color, texture, turgor normal. No rashes or lesions Neurologic: Alert and oriented X 3, normal strength and tone. Normal symmetric reflexes. Normal coordination and gait  EKG not performed today  ASSESSMENT AND PLAN:   Hypertension associated with diabetes History of essential hypertension with blood pressure measured today 150/90.  He is on amlodipine and losartan.  Continue current meds at current dosing.  Hyperlipidemia History of hyperlipidemia on lovastatin with recent lipid profile performed 09/01/2017 revealing total cholesterol 126, LDL of 73 and HDL of 22.  Chest pain History of atypical chest pain that occurs on a daily basis for the last several months sound somewhat atypical.  He does have a strong family history for heart disease as well as treated hypertension, diabetes and hyperlipidemia.  I am going to get an  exercise Myoview stress test to further evaluate.  Dyspnea on exertion History of dyspnea on exertion without prior history of tobacco abuse over the last several months.  I am going to get a 2D echocardiogram to further evaluate      Lorretta Harp MD [facility] [place], [place] [facility] 03/26/2018 1:33 PM

## 2018-03-26 NOTE — Assessment & Plan Note (Signed)
History of dyspnea on exertion without prior history of tobacco abuse over the last several months.  I am going to get a 2D echocardiogram to further evaluate

## 2018-03-26 NOTE — Assessment & Plan Note (Signed)
History of essential hypertension with blood pressure measured today 150/90.  He is on amlodipine and losartan.  Continue current meds at current dosing.

## 2018-03-26 NOTE — Patient Instructions (Signed)
Medication Instructions:  Your physician recommends that you continue on your current medications as directed. Please refer to the Current Medication list given to you today.  If you need a refill on your cardiac medications before your next appointment, please call your pharmacy.   Lab work: NONE If you have labs (blood work) drawn today and your tests are completely normal, you will receive your results only by: Marland Kitchen MyChart Message (if you have MyChart) OR . A paper copy in the mail If you have any lab test that is abnormal or we need to change your treatment, we will call you to review the results.  Testing/Procedures: Your physician has requested that you have an echocardiogram. Echocardiography is a painless test that uses sound waves to create images of your heart. It provides your doctor with information about the size and shape of your heart and how well your heart's chambers and valves are working. This procedure takes approximately one hour. There are no restrictions for this procedure.  Your physician has requested that you have en exercise stress myoview. For further information please visit [email or web address]. Please follow instruction sheet, as given.    Follow-Up: At [facility], you and your health needs are our priority.  As part of our continuing mission to provide you with exceptional heart care, we have created designated Provider Care Teams.  These Care Teams include your primary Cardiologist (physician) and Advanced Practice Providers (APPs -  Physician Assistants and Nurse Practitioners) who all work together to provide you with the care you need, when you need it. You may need a follow up appointment pending the results of the tests listed above. If so, you may see Dr. Gwenlyn Found or one of the following Advanced Practice Providers on your designated Care Team:   Kerin Ransom, PA-C Roby Lofts, [place] . Sande Rives, PA-C

## 2018-03-26 NOTE — Assessment & Plan Note (Signed)
History of hyperlipidemia on lovastatin with recent lipid profile performed 09/01/2017 revealing total cholesterol 126, LDL of 73 and HDL of 22.

## 2018-03-30 ENCOUNTER — Encounter (HOSPITAL_COMMUNITY): Payer: Self-pay | Admitting: Cardiovascular Disease

## 2018-04-05 ENCOUNTER — Other Ambulatory Visit (HOSPITAL_COMMUNITY): Payer: Medicare HMO

## 2018-04-08 ENCOUNTER — Inpatient Hospital Stay (HOSPITAL_COMMUNITY): Admission: RE | Admit: 2018-04-08 | Payer: Medicare HMO | Source: Ambulatory Visit

## 2018-04-09 ENCOUNTER — Ambulatory Visit (HOSPITAL_COMMUNITY): Payer: Medicare HMO

## 2018-04-13 DIAGNOSIS — R69 Illness, unspecified: Secondary | ICD-10-CM | POA: Diagnosis not present

## 2018-05-04 ENCOUNTER — Other Ambulatory Visit: Payer: Self-pay | Admitting: Family Medicine

## 2018-05-05 DIAGNOSIS — R69 Illness, unspecified: Secondary | ICD-10-CM | POA: Diagnosis not present

## 2018-05-18 ENCOUNTER — Other Ambulatory Visit (INDEPENDENT_AMBULATORY_CARE_PROVIDER_SITE_OTHER): Payer: Medicare HMO

## 2018-05-18 DIAGNOSIS — Z794 Long term (current) use of insulin: Secondary | ICD-10-CM

## 2018-05-18 DIAGNOSIS — E1165 Type 2 diabetes mellitus with hyperglycemia: Secondary | ICD-10-CM | POA: Diagnosis not present

## 2018-05-18 LAB — COMPREHENSIVE METABOLIC PANEL
ALT: 21 U/L (ref 0–53)
AST: 23 U/L (ref 0–37)
Albumin: 4.1 g/dL (ref 3.5–5.2)
Alkaline Phosphatase: 51 U/L (ref 39–117)
BUN: 17 mg/dL (ref 6–23)
CALCIUM: 9.4 mg/dL (ref 8.4–10.5)
CO2: 29 mEq/L (ref 19–32)
CREATININE: 1.3 mg/dL (ref 0.40–1.50)
Chloride: 104 mEq/L (ref 96–112)
GFR: 55.9 mL/min — ABNORMAL LOW (ref >60.00)
Glucose, Bld: 145 mg/dL — ABNORMAL HIGH (ref 70–99)
Potassium: 4.5 mEq/L (ref 3.5–5.1)
Sodium: 141 mEq/L (ref 135–145)
Total Bilirubin: 0.4 mg/dL (ref 0.2–1.2)
Total Protein: 6.7 g/dL (ref 6.0–8.3)

## 2018-05-18 LAB — MICROALBUMIN / CREATININE URINE RATIO
Creatinine,U: 75.1 mg/dL
Microalb Creat Ratio: 2 mg/g (ref 0.0–30.0)
Microalb, Ur: 1.5 mg/dL (ref 0.0–1.9)

## 2018-05-18 LAB — HEMOGLOBIN A1C: Hgb A1c MFr Bld: 6.8 % — ABNORMAL HIGH (ref 4.6–6.5)

## 2018-05-20 ENCOUNTER — Encounter: Payer: Self-pay | Admitting: Endocrinology

## 2018-05-20 ENCOUNTER — Ambulatory Visit: Payer: Medicare HMO | Admitting: Endocrinology

## 2018-05-20 VITALS — BP 130/70 | HR 96 | Ht 74.0 in | Wt 306.6 lb

## 2018-05-20 DIAGNOSIS — F3160 Bipolar disorder, current episode mixed, unspecified: Secondary | ICD-10-CM

## 2018-05-20 DIAGNOSIS — R69 Illness, unspecified: Secondary | ICD-10-CM | POA: Diagnosis not present

## 2018-05-20 DIAGNOSIS — E1165 Type 2 diabetes mellitus with hyperglycemia: Secondary | ICD-10-CM

## 2018-05-20 DIAGNOSIS — Z794 Long term (current) use of insulin: Secondary | ICD-10-CM | POA: Diagnosis not present

## 2018-05-20 NOTE — Progress Notes (Signed)
Patient ID: Matthew Barnes, male   DOB: [birth year], 63 y.o.   MRN: [id number]    Reason for Appointment: Followup for Type 2 Diabetes  Referring physician:  Maudie Mercury  History of Present Illness:          Diagnosis: Type 2 diabetes mellitus, date of diagnosis:   2011       Past history:  He was started on metformin at diagnosis and apparently did have fairly good control initially However about a year later because of poor control he was given insulin in addition. He had been taking Lantus insulin until about 2/15, up to 80 units a day However he does not think his blood sugars  Were controlled with this Because of higher A1c of 13.1% he was referred here for diabetes management in 6/15; was started on glucose monitoring and insulin was changed from low dose Levemir to Humalog mix 70 units a day He was also started on Victoza  to help with blood sugar control and facilitate weight loss on his initial consultation  Recent history:   INSULIN regimen is described as: Novolin mix 70/30, 40 units in the morning only  A1c is relatively higher at 6.8, previously 6.4   Current blood sugar patterns, management of diabetes and problems:  As before he is taking his insulin only in the morning  Most of his morning readings especially fasting are still fairly good  However recently he thinks that because of increasing depression he is not able to watch his diet but eating more carbohydrates and overall caloric intake his causing his weight to go up  Not motivated to exercise  Has sporadic readings over 200 when he is eating more sweets like cakes or other carbohydrates  However does not check readings after meals  Also not clear which of his readings in the late morning or before or after breakfast, most of them are marked after eating  No hypoglycemia midday with his insulin  He is usually not able to afford brand-name medications  He has gained weight   Oral hypoglycemic drugs the  patient is taking are: Metformin 1 g twice a day      Side effects from medications have been: None  Glucose monitoring:  done 1 times a day         Glucometer:  Verio   Blood Glucose readings from download of monitor as below   PRE-MEAL Fasting Lunch Dinner Bedtime Overall  Glucose range:  99-137      Mean/median:   143   132   POST-MEAL PC Breakfast PC Lunch PC Dinner  Glucose range:  130-257    Mean/median:      PREVIOUS readings:  PRE-MEAL Fasting Lunch Dinner Bedtime Overall  Glucose range:  110-134      Mean/median:     127   POST-MEAL PC Breakfast PC Lunch PC Dinner  Glucose range:  136-209    Mean/median:        Glycemic control:   Lab Results  Component Value Date   HGBA1C 6.8 (H) 05/18/2018   HGBA1C 6.4 01/11/2018   HGBA1C 6.3 10/07/2017   Lab Results  Component Value Date   MICROALBUR 1.5 05/18/2018   LDLCALC 43 08/01/2016   CREATININE 1.30 05/18/2018    Self-care: The diet that the patient has been following is: None, unable to control portions and carbs  when depressed  Meals: 3 meals per day (dinner 6 pm-10 pm)  Exercise: Elliptical at home, of times a week        Dietician visit: Most recent: never      CDE visit: 08/2013             Weight history: baseline 295  Wt Readings from Last 3 Encounters:  05/20/18 (!) 306 lb 9.6 oz (139.1 kg)  03/26/18 298 lb (135.2 kg)  03/23/18 299 lb 1.6 oz (135.7 kg)   Lab on 05/18/2018  Component Date Value Ref Range Status  . Microalb, Ur 05/18/2018 1.5  0.0 - 1.9 mg/dL Final  . Creatinine,U 05/18/2018 75.1  mg/dL Final  . Microalb Creat Ratio 05/18/2018 2.0  0.0 - 30.0 mg/g Final  . Sodium 05/18/2018 141  135 - 145 mEq/L Final  . Potassium 05/18/2018 4.5  3.5 - 5.1 mEq/L Final  . Chloride 05/18/2018 104  96 - 112 mEq/L Final  . CO2 05/18/2018 29  19 - 32 mEq/L Final  . Glucose, Bld 05/18/2018 145* 70 - 99 mg/dL Final  . BUN 05/18/2018 17  6 - 23 mg/dL Final  . Creatinine, Ser 05/18/2018 1.30   0.40 - 1.50 mg/dL Final  . Total Bilirubin 05/18/2018 0.4  0.2 - 1.2 mg/dL Final  . Alkaline Phosphatase 05/18/2018 51  39 - 117 U/L Final  . AST 05/18/2018 23  0 - 37 U/L Final  . ALT 05/18/2018 21  0 - 53 U/L Final  . Total Protein 05/18/2018 6.7  6.0 - 8.3 g/dL Final  . Albumin 05/18/2018 4.1  3.5 - 5.2 g/dL Final  . Calcium 05/18/2018 9.4  8.4 - 10.5 mg/dL Final  . GFR 05/18/2018 55.90* >60.00 mL/min Final  . Hgb A1c MFr Bld 05/18/2018 6.8* 4.6 - 6.5 % Final   Glycemic Control Guidelines for People with Diabetes:Non Diabetic:  <6%Goal of Therapy: <7%Additional Action Suggested:  >8%       Allergies as of 05/20/2018      Reactions   Influenza Vaccines Swelling   Reports of swelling of the arm and then sick for 10 days      Medication List       Accurate as of May 20, 2018  9:02 AM. Always use your most recent med list.        amLODipine 10 MG tablet Commonly known as:  NORVASC Take 1 tablet (10 mg total) by mouth daily.   gabapentin 300 MG capsule Commonly known as:  NEURONTIN TAKE 1 CAPSULE BY MOUTH THREE TIMES A DAY   hydrochlorothiazide 25 MG tablet Commonly known as:  HYDRODIURIL Take 0.5 tablets (12.5 mg total) by mouth daily.   INSULIN SYRINGE 1CC/31GX5/16" 31G X 5/16" 1 ML Misc Use 3 needles per day   lithium carbonate 300 MG capsule Take 300 mg by mouth daily. Take 2 tablets by mouth at bedtime.   loratadine 10 MG tablet Commonly known as:  CLARITIN TAKE 1 TABLET EVERY DAY. **NOT COVERED UNDER INSURANCE   losartan 100 MG tablet Commonly known as:  COZAAR TAKE 1 TABLET BY MOUTH EVERY DAY   lovastatin 20 MG tablet Commonly known as:  MEVACOR TAKE 1 TABLET BY MOUTH EVERYDAY AT BEDTIME   metFORMIN 1000 MG tablet Commonly known as:  GLUCOPHAGE TAKE 1 TABLET BY MOUTH 2 TIMES DAILY WITH A MEAL.   NOVOLIN 70/30 (70-30) 100 UNIT/ML injection Generic drug:  insulin NPH-regular Human INJECT 50 UNITS UNDER THE SKIN 3 TIMES DAILY AS DIRECTED     omeprazole 20 MG capsule Commonly known as:  PRILOSEC  TAKE 1 CAPSULE BY MOUTH EVERY DAY   ONETOUCH VERIO test strip Generic drug:  glucose blood USE AS INSTRUCTED TO CHECK BLOOD SUGAR THREE TIMES A DAY.   QUEtiapine 50 MG tablet Commonly known as:  SEROQUEL Take 50 mg by mouth at bedtime.   sildenafil 50 MG tablet Commonly known as:  VIAGRA Take 1 tablet (50 mg total) by mouth daily as needed for erectile dysfunction. Do not take more then once in 24 hours.   tiZANidine 2 MG tablet Commonly known as:  ZANAFLEX Take 1 tablet (2 mg total) by mouth at bedtime as needed for muscle spasms.       Allergies:  Allergies  Allergen Reactions  . Influenza Vaccines Swelling    Reports of swelling of the arm and then sick for 10 days    Past Medical History:  Diagnosis Date  . Bipolar II disorder - Managed by Marye Round [phone]) 05/03/2013  . Diabetes mellitus   . Hyperlipidemia   . Hypertension   . Unspecified hypothyroidism 05/03/2013    Past Surgical History:  Procedure Laterality Date  . KNEE SURGERY      Family History  Problem Relation Age of Onset  . Diabetes Mother   . Hypertension Mother   . Heart disease Father   . Heart attack Father 80    Social History:  reports that he has never smoked. He has never used smokeless tobacco. He reports that he does not drink alcohol or use drugs.    Review of Systems   HYPERTENSION:  Currently taking losartan 100 mg and Norvasc 10, HCTZ 12.5 followed by PCP Blood pressure usually high when he first comes into the office visit when he is on his medically PCP so but looks a few want to add somebody else's name if they are available on epic Does not monitor at home   BP Readings from Last 3 Encounters:  05/20/18 130/70  03/26/18 (!) 150/90  03/23/18 100/72         LIPIDS: He has had marked increase in triglycerides previously.  Currently only on 20 mg lovastatin, also followed by PCP        Lab  Results  Component Value Date   CHOL 126 09/01/2017   HDL 22.30 (L) 09/01/2017   LDLCALC 43 08/01/2016   LDLDIRECT 73.0 09/01/2017   TRIG 255.0 (H) 09/01/2017   CHOLHDL 6 09/01/2017       Thyroid:  Has Previously had mild hypothyroidism for a few years, likely when he was taking lithium TSH subsequently has been normal consistently without taking any levothyroxine Recently starting back on lithium   Lab Results  Component Value Date   TSH 1.53 03/23/2018        He has had some numbness in his feet especially on the right side, using gabapentin only as needed now Using elastic stockings  He had foot exam from PCP showing:   Patchy decrease in sensation in the toes especially right and decreased on the plantar surface also   LABS:  Lab on 05/18/2018  Component Date Value Ref Range Status  . Microalb, Ur 05/18/2018 1.5  0.0 - 1.9 mg/dL Final  . Creatinine,U 05/18/2018 75.1  mg/dL Final  . Microalb Creat Ratio 05/18/2018 2.0  0.0 - 30.0 mg/g Final  . Sodium 05/18/2018 141  135 - 145 mEq/L Final  . Potassium 05/18/2018 4.5  3.5 - 5.1 mEq/L Final  . Chloride 05/18/2018 104  96 - 112 mEq/L Final  .  CO2 05/18/2018 29  19 - 32 mEq/L Final  . Glucose, Bld 05/18/2018 145* 70 - 99 mg/dL Final  . BUN 05/18/2018 17  6 - 23 mg/dL Final  . Creatinine, Ser 05/18/2018 1.30  0.40 - 1.50 mg/dL Final  . Total Bilirubin 05/18/2018 0.4  0.2 - 1.2 mg/dL Final  . Alkaline Phosphatase 05/18/2018 51  39 - 117 U/L Final  . AST 05/18/2018 23  0 - 37 U/L Final  . ALT 05/18/2018 21  0 - 53 U/L Final  . Total Protein 05/18/2018 6.7  6.0 - 8.3 g/dL Final  . Albumin 05/18/2018 4.1  3.5 - 5.2 g/dL Final  . Calcium 05/18/2018 9.4  8.4 - 10.5 mg/dL Final  . GFR 05/18/2018 55.90* >60.00 mL/min Final  . Hgb A1c MFr Bld 05/18/2018 6.8* 4.6 - 6.5 % Final   Glycemic Control Guidelines for People with Diabetes:Non Diabetic:  <6%Goal of Therapy: <7%Additional Action Suggested:  >8%     Physical  Examination:  BP 130/70 (BP Location: Left Arm, Patient Position: Sitting, Cuff Size: Normal)   Pulse 96   Ht 6\' 2"  (1.88 m)   Wt (!) 306 lb 9.6 oz (139.1 kg)   SpO2 94%   BMI 39.37 kg/m     ASSESSMENT/PLAN:   Diabetes type 2, BMI under 40  See history of present illness for detailed discussion of his current management, blood sugar patterns and problems identified  His blood sugar control is somewhat worse with A1c 6.8 compared to 6.4  However his home blood sugars are not consistently high although he is checking readings mostly in the mornings He is not motivated to watch his diet or exercise with his recent depression  Again does not want to take brand-name medication such as Jardiance despite potential long-term cardiovascular and renal benefits  Hypertension:  Blood pressure trolled  With starting lithium he will need to have thyroid levels on the next visit   There are no Patient Instructions on file for this visit.    Elayne Snare 05/20/2018, 9:02 AM   Note: This office note was prepared with Dragon voice recognition system technology. Any transcriptional errors that result from this process are unintentional.

## 2018-05-20 NOTE — Patient Instructions (Addendum)
Check blood sugars on waking up 3-4 days a week  Also check blood sugars about 2 hours after meals and do this after different meals by rotation  Recommended blood sugar levels on waking up are 90-130 and about 2 hours after meal is 130-160  Please bring your blood sugar monitor to each visit, thank you  Start exercise

## 2018-06-02 DIAGNOSIS — Z79899 Other long term (current) drug therapy: Secondary | ICD-10-CM | POA: Diagnosis not present

## 2018-06-09 DIAGNOSIS — R69 Illness, unspecified: Secondary | ICD-10-CM | POA: Diagnosis not present

## 2018-06-11 ENCOUNTER — Telehealth: Payer: Self-pay

## 2018-06-11 NOTE — Telephone Encounter (Signed)
Author phoned pt. to attempt to reschedule 6/4 AWV appointment d/t author being on leave. Pt. Stated he could not reschedule at this point but would later; pt. Reminded of appt. With Dr. Maudie Mercury still for 6/4, and pt. Verbalized understanding.

## 2018-07-07 DIAGNOSIS — R69 Illness, unspecified: Secondary | ICD-10-CM | POA: Diagnosis not present

## 2018-07-27 ENCOUNTER — Other Ambulatory Visit: Payer: Self-pay | Admitting: Family Medicine

## 2018-08-12 DIAGNOSIS — R69 Illness, unspecified: Secondary | ICD-10-CM | POA: Diagnosis not present

## 2018-08-18 ENCOUNTER — Other Ambulatory Visit: Payer: Self-pay

## 2018-08-18 ENCOUNTER — Other Ambulatory Visit (INDEPENDENT_AMBULATORY_CARE_PROVIDER_SITE_OTHER): Payer: Medicare HMO

## 2018-08-18 DIAGNOSIS — Z794 Long term (current) use of insulin: Secondary | ICD-10-CM

## 2018-08-18 DIAGNOSIS — E1165 Type 2 diabetes mellitus with hyperglycemia: Secondary | ICD-10-CM | POA: Diagnosis not present

## 2018-08-18 DIAGNOSIS — F3160 Bipolar disorder, current episode mixed, unspecified: Secondary | ICD-10-CM

## 2018-08-18 DIAGNOSIS — R69 Illness, unspecified: Secondary | ICD-10-CM | POA: Diagnosis not present

## 2018-08-18 LAB — COMPREHENSIVE METABOLIC PANEL
ALT: 20 U/L (ref 0–53)
AST: 21 U/L (ref 0–37)
Albumin: 4.1 g/dL (ref 3.5–5.2)
Alkaline Phosphatase: 43 U/L (ref 39–117)
BUN: 16 mg/dL (ref 6–23)
CO2: 28 mEq/L (ref 19–32)
Calcium: 9.1 mg/dL (ref 8.4–10.5)
Chloride: 107 mEq/L (ref 96–112)
Creatinine, Ser: 1.3 mg/dL (ref 0.40–1.50)
GFR: 55.86 mL/min — ABNORMAL LOW (ref >60.00)
Glucose, Bld: 94 mg/dL (ref 70–99)
Potassium: 4.1 mEq/L (ref 3.5–5.1)
Sodium: 141 mEq/L (ref 135–145)
Total Bilirubin: 0.4 mg/dL (ref 0.2–1.2)
Total Protein: 6.7 g/dL (ref 6.0–8.3)

## 2018-08-18 LAB — T4, FREE: Free T4: 0.86 ng/dL (ref 0.60–1.60)

## 2018-08-18 LAB — TSH: TSH: 1.16 u[IU]/mL (ref 0.35–4.50)

## 2018-08-18 LAB — MICROALBUMIN / CREATININE URINE RATIO
Creatinine,U: 156.5 mg/dL
Microalb Creat Ratio: 1.9 mg/g (ref 0.0–30.0)
Microalb, Ur: 3 mg/dL — ABNORMAL HIGH (ref 0.0–1.9)

## 2018-08-18 LAB — HEMOGLOBIN A1C: Hgb A1c MFr Bld: 6.4 % (ref 4.6–6.5)

## 2018-08-19 LAB — FRUCTOSAMINE: Fructosamine: 269 umol/L (ref 0–285)

## 2018-08-20 ENCOUNTER — Encounter: Payer: Self-pay | Admitting: Endocrinology

## 2018-08-20 ENCOUNTER — Other Ambulatory Visit: Payer: Self-pay

## 2018-08-20 ENCOUNTER — Ambulatory Visit (INDEPENDENT_AMBULATORY_CARE_PROVIDER_SITE_OTHER): Payer: Medicare HMO | Admitting: Endocrinology

## 2018-08-20 DIAGNOSIS — E669 Obesity, unspecified: Secondary | ICD-10-CM | POA: Diagnosis not present

## 2018-08-20 DIAGNOSIS — E1169 Type 2 diabetes mellitus with other specified complication: Secondary | ICD-10-CM

## 2018-08-20 NOTE — Progress Notes (Addendum)
Patient ID: Matthew Barnes, male   DOB: September 04, 1955, 63 y.o.   MRN: [id number]    Reason for Appointment: Followup for Type 2 Diabetes   Today's office visit was provided via telemedicine using video technique Explained to the patient and the the limitations of evaluation and management by telemedicine and the availability of in person appointments.  The patient understood the limitations and agreed to proceed. Patient also understood that the telehealth visit is billable. . Location of the patient: Home . Location of the provider: Office Only the patient and myself were participating in the encounter    Referring physician:  Maudie Mercury  History of Present Illness:          Diagnosis: Type 2 diabetes mellitus, date of diagnosis:   2011       Past history:  He was started on metformin at diagnosis and apparently did have fairly good control initially However about a year later because of poor control he was given insulin in addition. He had been taking Lantus insulin until about 2/15, up to 80 units a day However he does not think his blood sugars  Were controlled with this Because of higher A1c of 13.1% he was referred here for diabetes management in 6/15; was started on glucose monitoring and insulin was changed from low dose Levemir to Humalog mix 70 units a day He was also started on Victoza  to help with blood sugar control and facilitate weight loss on his initial consultation  Recent history:   INSULIN regimen is described as: Novolin mix 70/30, 40 units in the morning only  A1c is relatively better at 6.4, previously at 6.8   Current blood sugar patterns, management of diabetes and problems:  He has lost a significant amount of weight since his last visit and he thinks he has lost 16 pounds  This is mostly with having his depression under control and significantly improving his diet  He is still trying what he can do exercise on his elliptical, may not be as regular recently   Not clear if his blood sugars are higher after meals as he is not monitoring these  Even with only taking his premixed insulin in the morning only his fasting readings are near normal most of the time  Also does not report any hypoglycemic symptoms during the day   Oral hypoglycemic drugs the patient is taking are: Metformin 1 g twice a day      Side effects from medications have been: None  Glucose monitoring:  done about 1 times a day         Glucometer:  Verio   Blood Glucose readings from patient reporting his readings on monitor  FASTING range 110-136, no readings after meals  Previous readings:   PRE-MEAL Fasting Lunch Dinner Bedtime Overall  Glucose range:  99-137      Mean/median:   143   132   POST-MEAL PC Breakfast PC Lunch PC Dinner  Glucose range:  130-257    Mean/median:         Glycemic control:   Lab Results  Component Value Date   HGBA1C 6.4 08/18/2018   HGBA1C 6.8 (H) 05/18/2018   HGBA1C 6.4 01/11/2018   Lab Results  Component Value Date   MICROALBUR 3.0 (H) 08/18/2018   LDLCALC 43 08/01/2016   CREATININE 1.30 08/18/2018    Self-care: The diet that the patient has been following is: None, unable to control portions and carbs  when depressed  Meals:  3 meals per day (dinner 6 pm-10 pm)          Exercise: Elliptical at home, up to 4 times a week        Dietician visit: Most recent: never      CDE visit: 08/2013             Weight history: baseline 295  Wt Readings from Last 3 Encounters:  05/20/18 (!) 306 lb 9.6 oz (139.1 kg)  03/26/18 298 lb (135.2 kg)  03/23/18 299 lb 1.6 oz (135.7 kg)   Lab on 08/18/2018  Component Date Value Ref Range Status  . Free T4 08/18/2018 0.86  0.60 - 1.60 ng/dL Final   Comment: Specimens from patients who are undergoing biotin therapy and /or ingesting biotin supplements may contain high levels of biotin.  The higher biotin concentration in these specimens interferes with this Free T4 assay.  Specimens that  contain high levels  of biotin may cause false high results for this Free T4 assay.  Please interpret results in light of the total clinical presentation of the patient.    Marland Kitchen TSH 08/18/2018 1.16  0.35 - 4.50 uIU/mL Final  . Hgb A1c MFr Bld 08/18/2018 6.4  4.6 - 6.5 % Final   Glycemic Control Guidelines for People with Diabetes:Non Diabetic:  <6%Goal of Therapy: <7%Additional Action Suggested:  >8%   . Microalb, Ur 08/18/2018 3.0* 0.0 - 1.9 mg/dL Final  . Creatinine,U 08/18/2018 156.5  mg/dL Final  . Microalb Creat Ratio 08/18/2018 1.9  0.0 - 30.0 mg/g Final  . Fructosamine 08/18/2018 269  0 - 285 umol/L Final   Comment: Published reference interval for apparently healthy subjects between age 10 and 76 is 87 - 285 umol/L and in a poorly controlled diabetic population is 228 - 563 umol/L with a mean of 396 umol/L.   Marland Kitchen Sodium 08/18/2018 141  135 - 145 mEq/L Final  . Potassium 08/18/2018 4.1  3.5 - 5.1 mEq/L Final  . Chloride 08/18/2018 107  96 - 112 mEq/L Final  . CO2 08/18/2018 28  19 - 32 mEq/L Final  . Glucose, Bld 08/18/2018 94  70 - 99 mg/dL Final  . BUN 08/18/2018 16  6 - 23 mg/dL Final  . Creatinine, Ser 08/18/2018 1.30  0.40 - 1.50 mg/dL Final  . Total Bilirubin 08/18/2018 0.4  0.2 - 1.2 mg/dL Final  . Alkaline Phosphatase 08/18/2018 43  39 - 117 U/L Final  . AST 08/18/2018 21  0 - 37 U/L Final  . ALT 08/18/2018 20  0 - 53 U/L Final  . Total Protein 08/18/2018 6.7  6.0 - 8.3 g/dL Final  . Albumin 08/18/2018 4.1  3.5 - 5.2 g/dL Final  . Calcium 08/18/2018 9.1  8.4 - 10.5 mg/dL Final  . GFR 08/18/2018 55.86* >60.00 mL/min Final      Allergies as of 08/20/2018      Reactions   Influenza Vaccines Swelling   Reports of swelling of the arm and then sick for 10 days      Medication List       Accurate as of Aug 20, 2018  8:56 AM. If you have any questions, ask your nurse or doctor.        amLODipine 10 MG tablet Commonly known as:  NORVASC Take 1 tablet (10 mg total) by  mouth daily.   Aplenzin 522 MG Tb24 Generic drug:  BuPROPion HBr Take 1 tablet by mouth daily. Take 1 tablet by mouth once daily.  gabapentin 300 MG capsule Commonly known as:  NEURONTIN TAKE 1 CAPSULE BY MOUTH THREE TIMES A DAY   hydrochlorothiazide 25 MG tablet Commonly known as:  HYDRODIURIL Take 0.5 tablets (12.5 mg total) by mouth daily.   INSULIN SYRINGE 1CC/31GX5/16" 31G X 5/16" 1 ML Misc Use 3 needles per day   lithium carbonate 300 MG capsule Take 300 mg by mouth daily. Take 2 tablets by mouth at bedtime.   loratadine 10 MG tablet Commonly known as:  CLARITIN TAKE 1 TABLET EVERY DAY. **NOT COVERED UNDER INSURANCE   losartan 100 MG tablet Commonly known as:  COZAAR TAKE 1 TABLET BY MOUTH EVERY DAY   lovastatin 20 MG tablet Commonly known as:  MEVACOR TAKE 1 TABLET BY MOUTH EVERYDAY AT BEDTIME   metFORMIN 1000 MG tablet Commonly known as:  GLUCOPHAGE TAKE 1 TABLET BY MOUTH 2 TIMES DAILY WITH A MEAL.   NovoLIN 70/30 (70-30) 100 UNIT/ML injection Generic drug:  insulin NPH-regular Human INJECT 50 UNITS UNDER THE SKIN 3 TIMES DAILY AS DIRECTED What changed:  See the new instructions.   omeprazole 20 MG capsule Commonly known as:  PRILOSEC TAKE 1 CAPSULE BY MOUTH EVERY DAY   OneTouch Verio test strip Generic drug:  glucose blood USE AS INSTRUCTED TO CHECK BLOOD SUGAR THREE TIMES A DAY.   QUEtiapine 50 MG tablet Commonly known as:  SEROQUEL Take 50 mg by mouth at bedtime.   sildenafil 50 MG tablet Commonly known as:  Viagra Take 1 tablet (50 mg total) by mouth daily as needed for erectile dysfunction. Do not take more then once in 24 hours.   tiZANidine 2 MG tablet Commonly known as:  ZANAFLEX Take 1 tablet (2 mg total) by mouth at bedtime as needed for muscle spasms.       Allergies:  Allergies  Allergen Reactions  . Influenza Vaccines Swelling    Reports of swelling of the arm and then sick for 10 days    Past Medical History:  Diagnosis  Date  . Bipolar II disorder - Managed by Marye Round [phone]) 05/03/2013  . Diabetes mellitus   . Hyperlipidemia   . Hypertension   . Unspecified hypothyroidism 05/03/2013    Past Surgical History:  Procedure Laterality Date  . KNEE SURGERY      Family History  Problem Relation Age of Onset  . Diabetes Mother   . Hypertension Mother   . Heart disease Father   . Heart attack Father 61    Social History:  reports that he has never smoked. He has never used smokeless tobacco. He reports that he does not drink alcohol or use drugs.    Review of Systems   HYPERTENSION:  Currently taking losartan 100 mg and Norvasc 10, HCTZ 12.5 followed by PCP  Does not monitor at home   BP Readings from Last 3 Encounters:  05/20/18 130/70  03/26/18 (!) 150/90  03/23/18 100/72         LIPIDS: He has had marked increase in triglycerides previously.  Currently only on 20 mg lovastatin, also followed by PCP        Lab Results  Component Value Date   CHOL 126 09/01/2017   HDL 22.30 (L) 09/01/2017   LDLCALC 43 08/01/2016   LDLDIRECT 73.0 09/01/2017   TRIG 255.0 (H) 09/01/2017   CHOLHDL 6 09/01/2017       Thyroid:  Has Previously had mild hypothyroidism for a few years, likely when he was taking lithium TSH subsequently has been  normal consistently without taking any levothyroxine Also on lithium now  Lab Results  Component Value Date   TSH 1.16 08/18/2018        He has had some numbness in his feet especially on the right side, using gabapentin only as needed Using elastic stockings  He had foot exam from PCP showing:   Patchy decrease in sensation in the toes especially right and decreased on the plantar surface also   LABS:  Lab on 08/18/2018  Component Date Value Ref Range Status  . Free T4 08/18/2018 0.86  0.60 - 1.60 ng/dL Final   Comment: Specimens from patients who are undergoing biotin therapy and /or ingesting biotin supplements may contain high  levels of biotin.  The higher biotin concentration in these specimens interferes with this Free T4 assay.  Specimens that contain high levels  of biotin may cause false high results for this Free T4 assay.  Please interpret results in light of the total clinical presentation of the patient.    Marland Kitchen TSH 08/18/2018 1.16  0.35 - 4.50 uIU/mL Final  . Hgb A1c MFr Bld 08/18/2018 6.4  4.6 - 6.5 % Final   Glycemic Control Guidelines for People with Diabetes:Non Diabetic:  <6%Goal of Therapy: <7%Additional Action Suggested:  >8%   . Microalb, Ur 08/18/2018 3.0* 0.0 - 1.9 mg/dL Final  . Creatinine,U 08/18/2018 156.5  mg/dL Final  . Microalb Creat Ratio 08/18/2018 1.9  0.0 - 30.0 mg/g Final  . Fructosamine 08/18/2018 269  0 - 285 umol/L Final   Comment: Published reference interval for apparently healthy subjects between age 76 and 66 is 79 - 285 umol/L and in a poorly controlled diabetic population is 228 - 563 umol/L with a mean of 396 umol/L.   Marland Kitchen Sodium 08/18/2018 141  135 - 145 mEq/L Final  . Potassium 08/18/2018 4.1  3.5 - 5.1 mEq/L Final  . Chloride 08/18/2018 107  96 - 112 mEq/L Final  . CO2 08/18/2018 28  19 - 32 mEq/L Final  . Glucose, Bld 08/18/2018 94  70 - 99 mg/dL Final  . BUN 08/18/2018 16  6 - 23 mg/dL Final  . Creatinine, Ser 08/18/2018 1.30  0.40 - 1.50 mg/dL Final  . Total Bilirubin 08/18/2018 0.4  0.2 - 1.2 mg/dL Final  . Alkaline Phosphatase 08/18/2018 43  39 - 117 U/L Final  . AST 08/18/2018 21  0 - 37 U/L Final  . ALT 08/18/2018 20  0 - 53 U/L Final  . Total Protein 08/18/2018 6.7  6.0 - 8.3 g/dL Final  . Albumin 08/18/2018 4.1  3.5 - 5.2 g/dL Final  . Calcium 08/18/2018 9.1  8.4 - 10.5 mg/dL Final  . GFR 08/18/2018 55.86* >60.00 mL/min Final    Physical Examination:  There were no vitals taken for this visit.    ASSESSMENT/PLAN:   Diabetes type 2, on insulin  See history of present illness for detailed discussion of his current management, blood sugar patterns and  problems identified  His blood sugar control is somewhat better with the A1c 6.4 and usually is under 7% consistently  Although previously had required larger dose of insulin and or not twice a day regimen with his premixed insulin he is now only taking insulin once a day in the morning along with metformin Recently has done very well with his diet and has lost weight reportedly Most likely since his A1c is fairly good he does not have consistent hypoglycemia after meals and is not markedly insulin  deficient  We will continue his regimen unchanged However stressed importance of checking readings after meals to detect any postprandial hyperglycemia and also modify diet if needed Encouraged him to be active on his elliptical as much as possible Follow-up in 4 months  TSH normal, does not appear to have any effects of lithium on the thyroid  Follow-up with PCP for hypertension Currently has no microalbuminuria   There are no Patient Instructions on file for this visit.    Elayne Snare 08/20/2018, 8:56 AM   Note: This office note was prepared with Dragon voice recognition system technology. Any transcriptional errors that result from this process are unintentional.

## 2018-08-24 ENCOUNTER — Telehealth: Payer: Self-pay | Admitting: *Deleted

## 2018-08-24 NOTE — Progress Notes (Signed)
Please set up [email or web address] follow up per endo recs.

## 2018-08-24 NOTE — Telephone Encounter (Signed)
-----   Message from Lucretia Kern, DO sent at 08/24/2018  7:31 AM EDT -----   ----- Message ----- From: Elayne Snare, MD Sent: 08/21/2018  11:14 AM EDT To: Lucretia Kern, DO

## 2018-08-24 NOTE — Telephone Encounter (Signed)
I called the pt and scheduled an appt for 6/4 at 11am.

## 2018-09-02 ENCOUNTER — Other Ambulatory Visit: Payer: Self-pay

## 2018-09-02 ENCOUNTER — Telehealth: Payer: Self-pay | Admitting: *Deleted

## 2018-09-02 ENCOUNTER — Ambulatory Visit (INDEPENDENT_AMBULATORY_CARE_PROVIDER_SITE_OTHER): Payer: Medicare HMO | Admitting: Family Medicine

## 2018-09-02 ENCOUNTER — Ambulatory Visit: Payer: Medicare HMO

## 2018-09-02 DIAGNOSIS — E1169 Type 2 diabetes mellitus with other specified complication: Secondary | ICD-10-CM | POA: Diagnosis not present

## 2018-09-02 DIAGNOSIS — E1159 Type 2 diabetes mellitus with other circulatory complications: Secondary | ICD-10-CM

## 2018-09-02 DIAGNOSIS — R69 Illness, unspecified: Secondary | ICD-10-CM | POA: Diagnosis not present

## 2018-09-02 DIAGNOSIS — E785 Hyperlipidemia, unspecified: Secondary | ICD-10-CM

## 2018-09-02 DIAGNOSIS — I10 Essential (primary) hypertension: Secondary | ICD-10-CM

## 2018-09-02 DIAGNOSIS — F3160 Bipolar disorder, current episode mixed, unspecified: Secondary | ICD-10-CM | POA: Diagnosis not present

## 2018-09-02 DIAGNOSIS — I152 Hypertension secondary to endocrine disorders: Secondary | ICD-10-CM

## 2018-09-02 DIAGNOSIS — E1142 Type 2 diabetes mellitus with diabetic polyneuropathy: Secondary | ICD-10-CM

## 2018-09-02 MED ORDER — BLOOD PRESSURE CUFF MISC: 0 refills | Status: DC

## 2018-09-02 NOTE — Patient Instructions (Signed)
Take all medications daily.  Please get a blood pressure cuff and check your blood pressure 1-2 times per week. Keep a log for your next appointment.  Continue to eat a healthy diet and get regular exercise.  Do your Annual exam with Dr. Maudie Mercury in July or August.

## 2018-09-02 NOTE — Telephone Encounter (Signed)
I called the pt and he stated he has not had the eye exam yet as this is scheduled in October.  AWV scheduled for 8/4 and he is aware the Rx for a blood pressure cuff will be mailed to the home address.

## 2018-09-02 NOTE — Progress Notes (Signed)
Virtual Visit via Video Note  I connected with Matthew Barnes  on 09/02/18 at 11:00 AM EDT by a video enabled telemedicine application and verified that I am speaking with the correct person using two identifiers.  Location patient: home Location provider:work or home office Persons participating in the virtual visit: patient, provider  I discussed the limitations of evaluation and management by telemedicine and the availability of in person appointments. The patient expressed understanding and agreed to proceed.   HPI:  Matthew Barnes is a pleasant 63 yo with a PMH of diabetes (managed by his endocrinologist), htn, hyperlipidemia, GERD and Bipolar disorder (managed by his psychiatrist) seen for follow up. He did labs with endocrinology - reviewed.  Sees Dr. Ronnald Ramp at [facility] for his eye exams. Reports had his eye exam in September. Eating healthier and is happy with some weights loss.  Reports mood has been good. 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 His blood pressure was up at his recent visit with endo, but he reports that is because he had stopped taking all of his BP medications! Reports is now back on them and feels fine. No CP, SOB. Plans to continue to take them and his statin. Does not have BP cuff.  ROS: See pertinent positives and negatives per HPI.  Past Medical History:  Diagnosis Date  . Bipolar II disorder - Managed by Marye Round [phone]) 05/03/2013  . Diabetes mellitus   . Hyperlipidemia   . Hypertension   . Unspecified hypothyroidism 05/03/2013    Past Surgical History:  Procedure Laterality Date  . KNEE SURGERY      Family History  Problem Relation Age of Onset  . Diabetes Mother   . Hypertension Mother   . Heart disease Father   . Heart attack Father 72    SOCIAL HX: see hpi   Current Outpatient Medications:  .  amLODipine (NORVASC) 10 MG tablet, Take 1 tablet (10 mg total) by  mouth daily., Disp: 90 tablet, Rfl: 2 .  BuPROPion HBr (APLENZIN) 522 MG TB24, Take 1 tablet by mouth daily. Take 1 tablet by mouth once daily., Disp: , Rfl:  .  gabapentin (NEURONTIN) 300 MG capsule, TAKE 1 CAPSULE BY MOUTH THREE TIMES A DAY, Disp: 270 capsule, Rfl: 0 .  hydrochlorothiazide (HYDRODIURIL) 25 MG tablet, Take 0.5 tablets (12.5 mg total) by mouth daily., Disp: 45 tablet, Rfl: 3 .  Insulin Syringe-Needle U-100 (INSULIN SYRINGE 1CC/31GX5/16") 31G X 5/16" 1 ML MISC, Use 3 needles per day, Disp: 100 each, Rfl: 3 .  lithium carbonate 300 MG capsule, Take 300 mg by mouth daily. Take 2 tablets by mouth at bedtime., Disp: , Rfl:  .  loratadine (CLARITIN) 10 MG tablet, TAKE 1 TABLET EVERY DAY. **NOT COVERED UNDER INSURANCE, Disp: 90 tablet, Rfl: 1 .  losartan (COZAAR) 100 MG tablet, TAKE 1 TABLET BY MOUTH EVERY DAY, Disp: 90 tablet, Rfl: 1 .  lovastatin (MEVACOR) 20 MG tablet, TAKE 1 TABLET BY MOUTH EVERYDAY AT BEDTIME, Disp: 90 tablet, Rfl: 1 .  metFORMIN (GLUCOPHAGE) 1000 MG tablet, TAKE 1 TABLET BY MOUTH 2 TIMES DAILY WITH A MEAL., Disp: 180 tablet, Rfl: 0 .  NOVOLIN 70/30 (70-30) 100 UNIT/ML injection, INJECT 50 UNITS UNDER THE SKIN 3 TIMES DAILY AS DIRECTED (Patient taking differently: Inject 40 units under the skin once daily), Disp: 120 mL, Rfl: 1 .  omeprazole (PRILOSEC) 20 MG capsule, TAKE 1 CAPSULE BY  MOUTH EVERY DAY, Disp: 90 capsule, Rfl: 0 .  ONETOUCH VERIO test strip, USE AS INSTRUCTED TO CHECK BLOOD SUGAR THREE TIMES A DAY., Disp: 300 each, Rfl: 1 .  QUEtiapine (SEROQUEL) 50 MG tablet, Take 50 mg by mouth at bedtime., Disp: , Rfl:  .  sildenafil (VIAGRA) 50 MG tablet, Take 1 tablet (50 mg total) by mouth daily as needed for erectile dysfunction. Do not take more then once in 24 hours., Disp: 10 tablet, Rfl: 3 .  tiZANidine (ZANAFLEX) 2 MG tablet, Take 1 tablet (2 mg total) by mouth at bedtime as needed for muscle spasms., Disp: 30 tablet, Rfl: 0  EXAM:  VITALS per patient if  applicable: There were no vitals filed for this visit. There is no height or weight on file to calculate BMI.   GENERAL: alert, oriented, appears well and in no acute distress  HEENT: atraumatic, conjunttiva clear, no obvious abnormalities on inspection of external nose and ears  NECK: normal movements of the head and neck  LUNGS: on inspection no signs of respiratory distress, breathing rate appears normal, no obvious gross SOB, gasping or wheezing  CV: no obvious cyanosis  MS: moves all visible extremities without noticeable abnormality  PSYCH/NEURO: pleasant and cooperative, no obvious depression or anxiety, speech and thought processing grossly intact  ASSESSMENT AND PLAN:  Discussed the following assessment and plan:  Hypertension associated with diabetes ([place])  Hyperlipidemia associated with type 2 diabetes mellitus ([place])  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  Congratulated on weight reduction and healthy habits. Advised masking and social distancing given high risk for complications of [id number].  Advised continuation of his BP medications! Advised home monitoring and will have assistant send Rx for a cuff.  AWV in 2-3 months. I discussed the assessment and treatment plan with the patient. The patient was provided an opportunity to ask questions and all were answered. The patient agreed with the plan and demonstrated an understanding of the instructions.   The patient was advised to call back or seek an in-person evaluation if the symptoms worsen or if the condition fails to improve as anticipated.   Follow up instructions: Advised assistant Wendie Simmer to help patient arrange the following: -obtain status of diabetic eye exam for [facility] and update in [place] with Dr. Maudie Mercury in 2-3 months -sent prescription for BP cuff Lucretia Kern, DO

## 2018-09-02 NOTE — Telephone Encounter (Signed)
-----   Message from Lucretia Kern, DO sent at 09/02/2018 11:11 AM EDT ----- -obtain status of diabetic eye exam for [facility] and update in [place] with Dr. Maudie Mercury in 2-3 months -sent prescription for BP cuff

## 2018-09-02 NOTE — Progress Notes (Signed)
error 

## 2018-09-10 ENCOUNTER — Other Ambulatory Visit: Payer: Self-pay | Admitting: Endocrinology

## 2018-09-13 DIAGNOSIS — R69 Illness, unspecified: Secondary | ICD-10-CM | POA: Diagnosis not present

## 2018-10-12 ENCOUNTER — Other Ambulatory Visit: Payer: Self-pay | Admitting: Family Medicine

## 2018-10-14 DIAGNOSIS — R69 Illness, unspecified: Secondary | ICD-10-CM | POA: Diagnosis not present

## 2018-10-24 ENCOUNTER — Other Ambulatory Visit: Payer: Self-pay | Admitting: Family Medicine

## 2018-11-02 ENCOUNTER — Ambulatory Visit: Payer: Medicare HMO | Admitting: Family Medicine

## 2018-11-18 DIAGNOSIS — R69 Illness, unspecified: Secondary | ICD-10-CM | POA: Diagnosis not present

## 2018-12-10 ENCOUNTER — Other Ambulatory Visit: Payer: Self-pay | Admitting: Family Medicine

## 2018-12-10 ENCOUNTER — Other Ambulatory Visit: Payer: Self-pay | Admitting: Endocrinology

## 2018-12-20 ENCOUNTER — Other Ambulatory Visit (INDEPENDENT_AMBULATORY_CARE_PROVIDER_SITE_OTHER): Payer: Medicare HMO

## 2018-12-20 ENCOUNTER — Other Ambulatory Visit: Payer: Self-pay

## 2018-12-20 DIAGNOSIS — E1169 Type 2 diabetes mellitus with other specified complication: Secondary | ICD-10-CM | POA: Diagnosis not present

## 2018-12-20 DIAGNOSIS — E669 Obesity, unspecified: Secondary | ICD-10-CM

## 2018-12-20 LAB — COMPREHENSIVE METABOLIC PANEL
ALT: 31 U/L (ref 0–53)
AST: 30 U/L (ref 0–37)
Albumin: 4.2 g/dL (ref 3.5–5.2)
Alkaline Phosphatase: 44 U/L (ref 39–117)
BUN: 10 mg/dL (ref 6–23)
CO2: 29 mEq/L (ref 19–32)
Calcium: 9.7 mg/dL (ref 8.4–10.5)
Chloride: 107 mEq/L (ref 96–112)
Creatinine, Ser: 1.37 mg/dL (ref 0.40–1.50)
GFR: 52.52 mL/min — ABNORMAL LOW (ref >60.00)
Glucose, Bld: 72 mg/dL (ref 70–99)
Potassium: 3.8 mEq/L (ref 3.5–5.1)
Sodium: 142 mEq/L (ref 135–145)
Total Bilirubin: 0.5 mg/dL (ref 0.2–1.2)
Total Protein: 6.7 g/dL (ref 6.0–8.3)

## 2018-12-20 LAB — LIPID PANEL
Cholesterol: 123 mg/dL (ref 0–200)
HDL: 31.2 mg/dL — ABNORMAL LOW (ref >39.00)
LDL Cholesterol: 69 mg/dL (ref 0–99)
NonHDL: 92.02
Total CHOL/HDL Ratio: 4
Triglycerides: 117 mg/dL (ref 0.0–149.0)
VLDL: 23.4 mg/dL (ref 0.0–40.0)

## 2018-12-20 LAB — HEMOGLOBIN A1C: Hgb A1c MFr Bld: 8.4 % — ABNORMAL HIGH (ref 4.6–6.5)

## 2018-12-23 ENCOUNTER — Other Ambulatory Visit: Payer: Self-pay

## 2018-12-24 ENCOUNTER — Other Ambulatory Visit: Payer: Self-pay

## 2018-12-24 ENCOUNTER — Encounter: Payer: Self-pay | Admitting: Endocrinology

## 2018-12-24 ENCOUNTER — Ambulatory Visit (INDEPENDENT_AMBULATORY_CARE_PROVIDER_SITE_OTHER): Payer: Medicare HMO | Admitting: Endocrinology

## 2018-12-24 DIAGNOSIS — E1165 Type 2 diabetes mellitus with hyperglycemia: Secondary | ICD-10-CM

## 2018-12-24 DIAGNOSIS — Z794 Long term (current) use of insulin: Secondary | ICD-10-CM

## 2018-12-24 NOTE — Progress Notes (Signed)
Patient ID: Matthew Barnes, male   DOB: [birth year], 63 y.o.   MRN: [id number]    Reason for Appointment: Followup for Type 2 Diabetes   Today's office visit was provided via telemedicine using video technique using Google duo Explained to the patient and the the limitations of evaluation and management by telemedicine and the availability of in person appointments.  The patient understood the limitations and agreed to proceed. Patient also understood that the telehealth visit is billable. . Location of the patient: Home . Location of the provider: Office Only the patient and myself were participating in the encounter    Referring physician:  Maudie Mercury  History of Present Illness:          Diagnosis: Type 2 diabetes mellitus, date of diagnosis:   2011       Past history:  He was started on metformin at diagnosis and apparently did have fairly good control initially However about a year later because of poor control he was given insulin in addition. He had been taking Lantus insulin until about 2/15, up to 80 units a day However he does not think his blood sugars  Were controlled with this Because of higher A1c of 13.1% he was referred here for diabetes management in 6/15; was started on glucose monitoring and insulin was changed from low dose Levemir to Humalog mix 70 units a day He was also started on Victoza  to help with blood sugar control and facilitate weight loss on his initial consultation  Recent history:   INSULIN regimen is described as: Novolin mix 70/30, 40 units in the morning only  A1c is much higher at 8.4, previously was better at 6.4   Current blood sugar patterns, management of diabetes and problems:  He appears to have much higher blood sugars than before when he checks them in the morning although lab glucose which was fasting done late in the morning was normal  His blood sugars fluctuate but he thinks that this is based on how his diet is the night before  He  is likely not watching his diet but portions and carbohydrates especially in the evenings  As before does not check his sugars after meals  Previously only with 1 injection of premixed insulin daily he would be having fairly good blood sugar readings in the mornings  He has not exercised despite having an elliptical machine at home  Previously has had excellent control when he is able to be consistent with his diet and lose weight   Oral hypoglycemic drugs the patient is taking are: Metformin 1 g twice a day      Side effects from medications have been: None  Glucose monitoring:  done about 1 times a day         Glucometer:  Verio   Blood Glucose readings from patient reporting his readings on monitor  FASTING range 134-235, previously 110-136, no readings after meals      Glycemic control:   Lab Results  Component Value Date   HGBA1C 8.4 (H) 12/20/2018   HGBA1C 6.4 08/18/2018   HGBA1C 6.8 (H) 05/18/2018   Lab Results  Component Value Date   MICROALBUR 3.0 (H) 08/18/2018   LDLCALC 69 12/20/2018   CREATININE 1.37 12/20/2018    Self-care: The diet that the patient has been following is: None, unable to control portions and carbs  when depressed  Meals: 3 meals per day (dinner 6 pm-10 pm)        Dietician  visit: Most recent: never      CDE visit: 08/2013             Weight history: baseline 295  Wt Readings from Last 3 Encounters:  05/20/18 (!) 306 lb 9.6 oz (139.1 kg)  03/26/18 298 lb (135.2 kg)  03/23/18 299 lb 1.6 oz (135.7 kg)   Lab on 12/20/2018  Component Date Value Ref Range Status  . Cholesterol 12/20/2018 123  0 - 200 mg/dL Final   ATP III Classification       Desirable:  < 200 mg/dL               Borderline High:  200 - 239 mg/dL          High:  > = 240 mg/dL  . Triglycerides 12/20/2018 117.0  0.0 - 149.0 mg/dL Final   Normal:  <150 mg/dLBorderline High:  150 - 199 mg/dL  . HDL 12/20/2018 31.20* >39.00 mg/dL Final  . VLDL 12/20/2018 23.4  0.0 - 40.0  mg/dL Final  . LDL Cholesterol 12/20/2018 69  0 - 99 mg/dL Final  . Total CHOL/HDL Ratio 12/20/2018 4   Final                  Men          Women1/2 Average Risk     3.4          3.3Average Risk          5.0          4.42X Average Risk          9.6          7.13X Average Risk          15.0          11.0                      . NonHDL 12/20/2018 92.02   Final   NOTE:  Non-HDL goal should be 30 mg/dL higher than patient's LDL goal (i.e. LDL goal of < 70 mg/dL, would have non-HDL goal of < 100 mg/dL)  . Sodium 12/20/2018 142  135 - 145 mEq/L Final  . Potassium 12/20/2018 3.8  3.5 - 5.1 mEq/L Final  . Chloride 12/20/2018 107  96 - 112 mEq/L Final  . CO2 12/20/2018 29  19 - 32 mEq/L Final  . Glucose, Bld 12/20/2018 72  70 - 99 mg/dL Final  . BUN 12/20/2018 10  6 - 23 mg/dL Final  . Creatinine, Ser 12/20/2018 1.37  0.40 - 1.50 mg/dL Final  . Total Bilirubin 12/20/2018 0.5  0.2 - 1.2 mg/dL Final  . Alkaline Phosphatase 12/20/2018 44  39 - 117 U/L Final  . AST 12/20/2018 30  0 - 37 U/L Final  . ALT 12/20/2018 31  0 - 53 U/L Final  . Total Protein 12/20/2018 6.7  6.0 - 8.3 g/dL Final  . Albumin 12/20/2018 4.2  3.5 - 5.2 g/dL Final  . Calcium 12/20/2018 9.7  8.4 - 10.5 mg/dL Final  . GFR 12/20/2018 52.52* >60.00 mL/min Final  . Hgb A1c MFr Bld 12/20/2018 8.4* 4.6 - 6.5 % Final   Glycemic Control Guidelines for People with Diabetes:Non Diabetic:  <6%Goal of Therapy: <7%Additional Action Suggested:  >8%       Allergies as of 12/24/2018      Reactions   Influenza Vaccines Swelling   Reports of swelling of the arm and then sick for 10 days  Medication List       Accurate as of December 24, 2018  9:08 AM. If you have any questions, ask your nurse or doctor.        amLODipine 10 MG tablet Commonly known as: NORVASC Take 1 tablet (10 mg total) by mouth daily.   Aplenzin 522 MG Tb24 Generic drug: BuPROPion HBr Take 1 tablet by mouth daily. Take 1 tablet by mouth once daily.   Blood  Pressure Cuff Misc Use as directed   gabapentin 300 MG capsule Commonly known as: NEURONTIN TAKE 1 CAPSULE BY MOUTH THREE TIMES A DAY   hydrochlorothiazide 25 MG tablet Commonly known as: HYDRODIURIL Take 0.5 tablets (12.5 mg total) by mouth daily.   INSULIN SYRINGE 1CC/31GX5/16" 31G X 5/16" 1 ML Misc Use 3 needles per day   lithium carbonate 300 MG capsule Take 300 mg by mouth daily. Take 3 tablets by mouth at bedtime.   loratadine 10 MG tablet Commonly known as: CLARITIN TAKE 1 TABLET EVERY DAY. **NOT COVERED UNDER INSURANCE   losartan 100 MG tablet Commonly known as: COZAAR TAKE 1 TABLET BY MOUTH EVERY DAY   lovastatin 20 MG tablet Commonly known as: MEVACOR TAKE 1 TABLET BY MOUTH EVERYDAY AT BEDTIME   metFORMIN 1000 MG tablet Commonly known as: GLUCOPHAGE TAKE 1 TABLET BY MOUTH 2 TIMES DAILY WITH A MEAL.   NovoLIN 70/30 (70-30) 100 UNIT/ML injection Generic drug: insulin NPH-regular Human INJECT 50 UNITS UNDER THE SKIN in am   omeprazole 20 MG capsule Commonly known as: PRILOSEC TAKE 1 CAPSULE BY MOUTH EVERY DAY   OneTouch Verio test strip Generic drug: glucose blood USE AS INSTRUCTED TO CHECK BLOOD SUGAR THREE TIMES A DAY.   QUEtiapine 50 MG tablet Commonly known as: SEROQUEL Take 100 mg by mouth at bedtime. Take 3 tablets by mouth daily at bedtime.   sildenafil 50 MG tablet Commonly known as: Viagra Take 1 tablet (50 mg total) by mouth daily as needed for erectile dysfunction. Do not take more then once in 24 hours.   tiZANidine 2 MG tablet Commonly known as: ZANAFLEX Take 1 tablet (2 mg total) by mouth at bedtime as needed for muscle spasms.       Allergies:  Allergies  Allergen Reactions  . Influenza Vaccines Swelling    Reports of swelling of the arm and then sick for 10 days    Past Medical History:  Diagnosis Date  . Bipolar II disorder - Managed by Marye Round [phone]) 05/03/2013  . Diabetes mellitus   . Hyperlipidemia    . Hypertension   . Unspecified hypothyroidism 05/03/2013    Past Surgical History:  Procedure Laterality Date  . KNEE SURGERY      Family History  Problem Relation Age of Onset  . Diabetes Mother   . Hypertension Mother   . Heart disease Father   . Heart attack Father 73    Social History:  reports that he has never smoked. He has never used smokeless tobacco. He reports that he does not drink alcohol or use drugs.    Review of Systems   HYPERTENSION:  Currently taking losartan 100 mg and Norvasc 10, HCTZ 12.5 followed by PCP  Does not monitor at home   BP Readings from Last 3 Encounters:  05/20/18 130/70  03/26/18 (!) 150/90  03/23/18 100/72         LIPIDS: He has had marked increase in triglycerides previously, now improved.  Currently only on 20 mg lovastatin, also followed  by PCP        Lab Results  Component Value Date   CHOL 123 12/20/2018   HDL 31.20 (L) 12/20/2018   LDLCALC 69 12/20/2018   LDLDIRECT 73.0 09/01/2017   TRIG 117.0 12/20/2018   CHOLHDL 4 12/20/2018       Thyroid:  Has Previously had mild hypothyroidism for a few years, likely when he was taking lithium TSH subsequently has been normal consistently without taking any levothyroxine Still on lithium long-term  Lab Results  Component Value Date   TSH 1.16 08/18/2018        He has had some numbness in his feet especially on the right side, using gabapentin only as needed Using elastic stockings  He had foot exam from PCP showing:   Patchy decrease in sensation in the toes especially right and decreased on the plantar surface also   LABS:  Lab on 12/20/2018  Component Date Value Ref Range Status  . Cholesterol 12/20/2018 123  0 - 200 mg/dL Final   ATP III Classification       Desirable:  < 200 mg/dL               Borderline High:  200 - 239 mg/dL          High:  > = 240 mg/dL  . Triglycerides 12/20/2018 117.0  0.0 - 149.0 mg/dL Final   Normal:  <150 mg/dLBorderline High:  150 -  199 mg/dL  . HDL 12/20/2018 31.20* >39.00 mg/dL Final  . VLDL 12/20/2018 23.4  0.0 - 40.0 mg/dL Final  . LDL Cholesterol 12/20/2018 69  0 - 99 mg/dL Final  . Total CHOL/HDL Ratio 12/20/2018 4   Final                  Men          Women1/2 Average Risk     3.4          3.3Average Risk          5.0          4.42X Average Risk          9.6          7.13X Average Risk          15.0          11.0                      . NonHDL 12/20/2018 92.02   Final   NOTE:  Non-HDL goal should be 30 mg/dL higher than patient's LDL goal (i.e. LDL goal of < 70 mg/dL, would have non-HDL goal of < 100 mg/dL)  . Sodium 12/20/2018 142  135 - 145 mEq/L Final  . Potassium 12/20/2018 3.8  3.5 - 5.1 mEq/L Final  . Chloride 12/20/2018 107  96 - 112 mEq/L Final  . CO2 12/20/2018 29  19 - 32 mEq/L Final  . Glucose, Bld 12/20/2018 72  70 - 99 mg/dL Final  . BUN 12/20/2018 10  6 - 23 mg/dL Final  . Creatinine, Ser 12/20/2018 1.37  0.40 - 1.50 mg/dL Final  . Total Bilirubin 12/20/2018 0.5  0.2 - 1.2 mg/dL Final  . Alkaline Phosphatase 12/20/2018 44  39 - 117 U/L Final  . AST 12/20/2018 30  0 - 37 U/L Final  . ALT 12/20/2018 31  0 - 53 U/L Final  . Total Protein 12/20/2018 6.7  6.0 - 8.3 g/dL Final  . Albumin 12/20/2018  4.2  3.5 - 5.2 g/dL Final  . Calcium 12/20/2018 9.7  8.4 - 10.5 mg/dL Final  . GFR 12/20/2018 52.52* >60.00 mL/min Final  . Hgb A1c MFr Bld 12/20/2018 8.4* 4.6 - 6.5 % Final   Glycemic Control Guidelines for People with Diabetes:Non Diabetic:  <6%Goal of Therapy: <7%Additional Action Suggested:  >8%     Physical Examination:  There were no vitals taken for this visit.    ASSESSMENT/PLAN:   Diabetes type 2, on insulin  See history of present illness for detailed discussion of his current management, blood sugar patterns and problems identified  His blood sugar control is now poor with A1c 8.4 which is 2 units higher than before  Blood sugars in the mornings are variable and as high as 235  Discussed with him that if his readings are over 200 in the morning they are likely much higher than night before and he needs to check those readings also Since he was not aware that his A1c has gone up significantly he is now somewhat more motivated to go back to his restricted caloric and carbohydrate diet Encouraged him to start using his elliptical at home He does need to check readings after meals and this was emphasized For now he will add another 10 units of insulin before suppertime until the blood sugars are at least under 120 at bedtime  Follow-up in 3 months    There are no Patient Instructions on file for this visit.    Elayne Snare 12/24/2018, 9:08 AM   Note: This office note was prepared with Dragon voice recognition system technology. Any transcriptional errors that result from this process are unintentional.

## 2018-12-28 DIAGNOSIS — R69 Illness, unspecified: Secondary | ICD-10-CM | POA: Diagnosis not present

## 2018-12-28 DIAGNOSIS — Z79899 Other long term (current) drug therapy: Secondary | ICD-10-CM | POA: Diagnosis not present

## 2018-12-28 NOTE — Progress Notes (Signed)
Patient needs inhouse PCP. If he would like to stick with me and Dr. Ethlyn Gallery, please schedule him a visit with Dr. Ethlyn Gallery when she is available and regular follow ups as due with me in the interim.  Or, he can see one of our other providers if he prefers to have one doctor whom is there full time.  Thanks!

## 2019-01-10 DIAGNOSIS — M79672 Pain in left foot: Secondary | ICD-10-CM | POA: Diagnosis not present

## 2019-01-10 DIAGNOSIS — M2042 Other hammer toe(s) (acquired), left foot: Secondary | ICD-10-CM | POA: Diagnosis not present

## 2019-01-10 DIAGNOSIS — M2041 Other hammer toe(s) (acquired), right foot: Secondary | ICD-10-CM | POA: Diagnosis not present

## 2019-01-10 DIAGNOSIS — G5762 Lesion of plantar nerve, left lower limb: Secondary | ICD-10-CM | POA: Diagnosis not present

## 2019-01-10 DIAGNOSIS — M79671 Pain in right foot: Secondary | ICD-10-CM | POA: Diagnosis not present

## 2019-01-10 DIAGNOSIS — E1140 Type 2 diabetes mellitus with diabetic neuropathy, unspecified: Secondary | ICD-10-CM | POA: Diagnosis not present

## 2019-01-11 DIAGNOSIS — Z0100 Encounter for examination of eyes and vision without abnormal findings: Secondary | ICD-10-CM | POA: Diagnosis not present

## 2019-01-11 LAB — HM DIABETES EYE EXAM

## 2019-01-31 DIAGNOSIS — M2041 Other hammer toe(s) (acquired), right foot: Secondary | ICD-10-CM | POA: Diagnosis not present

## 2019-01-31 DIAGNOSIS — E1140 Type 2 diabetes mellitus with diabetic neuropathy, unspecified: Secondary | ICD-10-CM | POA: Diagnosis not present

## 2019-01-31 DIAGNOSIS — G5761 Lesion of plantar nerve, right lower limb: Secondary | ICD-10-CM | POA: Diagnosis not present

## 2019-01-31 DIAGNOSIS — G5762 Lesion of plantar nerve, left lower limb: Secondary | ICD-10-CM | POA: Diagnosis not present

## 2019-01-31 DIAGNOSIS — M2042 Other hammer toe(s) (acquired), left foot: Secondary | ICD-10-CM | POA: Diagnosis not present

## 2019-02-04 ENCOUNTER — Other Ambulatory Visit: Payer: Self-pay | Admitting: Endocrinology

## 2019-02-08 ENCOUNTER — Other Ambulatory Visit: Payer: Self-pay

## 2019-02-08 ENCOUNTER — Telehealth: Payer: Self-pay

## 2019-02-08 MED ORDER — NOVOLIN 70/30 (70-30) 100 UNIT/ML ~~LOC~~ SUSP: SUBCUTANEOUS | 1 refills | Status: DC

## 2019-02-08 NOTE — Telephone Encounter (Signed)
He is supposed to be taking insulin twice a day, not clear what the question is

## 2019-02-08 NOTE — Telephone Encounter (Signed)
Received fax from pt's pharmacy stating that the pt would like his prescription for 70/30 insulin changed because he is using this insulin 2-3 times daily except once daily. Do you want pt to take this only once?

## 2019-02-21 DIAGNOSIS — G5762 Lesion of plantar nerve, left lower limb: Secondary | ICD-10-CM | POA: Diagnosis not present

## 2019-02-28 ENCOUNTER — Other Ambulatory Visit: Payer: Self-pay | Admitting: Endocrinology

## 2019-02-28 ENCOUNTER — Other Ambulatory Visit: Payer: Self-pay | Admitting: Family Medicine

## 2019-03-02 NOTE — Telephone Encounter (Signed)
Last OV 09/02/18 Last refill(s) 08/18/17 #90/1 Next OV 04/06/19

## 2019-03-28 ENCOUNTER — Other Ambulatory Visit (INDEPENDENT_AMBULATORY_CARE_PROVIDER_SITE_OTHER): Payer: Medicare HMO

## 2019-03-28 ENCOUNTER — Other Ambulatory Visit: Payer: Self-pay

## 2019-03-28 DIAGNOSIS — E1165 Type 2 diabetes mellitus with hyperglycemia: Secondary | ICD-10-CM | POA: Diagnosis not present

## 2019-03-28 DIAGNOSIS — Z794 Long term (current) use of insulin: Secondary | ICD-10-CM

## 2019-03-28 LAB — BASIC METABOLIC PANEL
BUN: 17 mg/dL (ref 6–23)
CO2: 29 mEq/L (ref 19–32)
Calcium: 9.4 mg/dL (ref 8.4–10.5)
Chloride: 107 mEq/L (ref 96–112)
Creatinine, Ser: 1.47 mg/dL (ref 0.40–1.50)
GFR: 48.37 mL/min — ABNORMAL LOW (ref >60.00)
Glucose, Bld: 125 mg/dL — ABNORMAL HIGH (ref 70–99)
Potassium: 4 mEq/L (ref 3.5–5.1)
Sodium: 141 mEq/L (ref 135–145)

## 2019-03-28 LAB — HEMOGLOBIN A1C: Hgb A1c MFr Bld: 6.1 % (ref 4.6–6.5)

## 2019-03-30 ENCOUNTER — Other Ambulatory Visit: Payer: Self-pay | Admitting: Family Medicine

## 2019-03-31 ENCOUNTER — Other Ambulatory Visit: Payer: Self-pay

## 2019-03-31 ENCOUNTER — Ambulatory Visit (INDEPENDENT_AMBULATORY_CARE_PROVIDER_SITE_OTHER): Payer: Medicare HMO | Admitting: Endocrinology

## 2019-03-31 ENCOUNTER — Encounter: Payer: Self-pay | Admitting: Endocrinology

## 2019-03-31 DIAGNOSIS — E1169 Type 2 diabetes mellitus with other specified complication: Secondary | ICD-10-CM | POA: Diagnosis not present

## 2019-03-31 DIAGNOSIS — E669 Obesity, unspecified: Secondary | ICD-10-CM | POA: Diagnosis not present

## 2019-03-31 NOTE — Progress Notes (Signed)
Patient ID: Matthew Barnes, male   DOB: 03/10/1956, 63 y.o.   MRN: [id number]    Reason for Appointment: Followup for Type 2 Diabetes   Today's office visit was provided via telemedicine using video technique using Google duo Explained to the patient and the the limitations of evaluation and management by telemedicine and the availability of in person appointments.  The patient understood the limitations and agreed to proceed. Patient also understood that the telehealth visit is billable. . Location of the patient: Home . Location of the provider: Office Only the patient and myself were participating in the encounter     History of Present Illness:          Diagnosis: Type 2 diabetes mellitus, date of diagnosis:   2011       Past history:  He was started on metformin at diagnosis and apparently did have fairly good control initially However about a year later because of poor control he was given insulin in addition. He had been taking Lantus insulin until about 2/15, up to 80 units a day However he does not think his blood sugars  Were controlled with this Because of higher A1c of 13.1% he was referred here for diabetes management in 6/15; was started on glucose monitoring and insulin was changed from low dose Levemir to Humalog mix 70 units a day He was also started on Victoza  to help with blood sugar control and facilitate weight loss on his initial consultation  Recent history:   INSULIN regimen is described as: Novolin mix 70/30, 40 units at breakfast, 30 units in p.m. as needed  Oral hypoglycemic drugs the patient is taking are: Metformin 1 g twice a day  A1c is much better at 6.1 compared to 8.4    Current blood sugar patterns, management of diabetes and problems:  He likely has improved his diet significantly since his last visit when his A1c was significantly higher  However difficult to assess his level of control from his home readings which are mostly checked in  the morning  Has no readings after meals  However he says that if he goes off his diet he will start having increased urination and he will then take 30 units of insulin for high sugars without testing  Lab glucose was 125 fasting but he has had readings as high as 188 in the mornings  No hypoglycemic symptoms currently and taking the same dose of insulin in the morning as before  He has exercised regularly with an elliptical machine at home  He thinks his weight is about 280 and may be up to 5 pounds recently       Side effects from medications have been: None  Glucose monitoring:  done about 1 times a day or less       Glucometer:  Verio   Blood Glucose readings from patient reporting his readings on monitor  FASTING blood sugar readings since 12/18: Range 112-188  PREVIOUS FASTING range 134-235  Glycemic control:   Lab Results  Component Value Date   HGBA1C 6.1 03/28/2019   HGBA1C 8.4 (H) 12/20/2018   HGBA1C 6.4 08/18/2018   Lab Results  Component Value Date   MICROALBUR 3.0 (H) 08/18/2018   LDLCALC 69 12/20/2018   CREATININE 1.47 03/28/2019    Self-care: The diet that the patient has been following is: None, unable to control portions and carbs  when depressed  Meals: 3 meals per day (dinner 6 pm-10 pm)  Dietician visit: Most recent: never      CDE visit: 08/2013             Weight history: baseline 295  Wt Readings from Last 3 Encounters:  05/20/18 (!) 306 lb 9.6 oz (139.1 kg)  03/26/18 298 lb (135.2 kg)  03/23/18 299 lb 1.6 oz (135.7 kg)   Lab on 03/28/2019  Component Date Value Ref Range Status  . Sodium 03/28/2019 141  135 - 145 mEq/L Final  . Potassium 03/28/2019 4.0  3.5 - 5.1 mEq/L Final  . Chloride 03/28/2019 107  96 - 112 mEq/L Final  . CO2 03/28/2019 29  19 - 32 mEq/L Final  . Glucose, Bld 03/28/2019 125* 70 - 99 mg/dL Final  . BUN 03/28/2019 17  6 - 23 mg/dL Final  . Creatinine, Ser 03/28/2019 1.47  0.40 - 1.50 mg/dL Final  . GFR  03/28/2019 48.37* >60.00 mL/min Final  . Calcium 03/28/2019 9.4  8.4 - 10.5 mg/dL Final  . Hgb A1c MFr Bld 03/28/2019 6.1  4.6 - 6.5 % Final   Glycemic Control Guidelines for People with Diabetes:Non Diabetic:  <6%Goal of Therapy: <7%Additional Action Suggested:  >8%       Allergies as of 03/31/2019      Reactions   Influenza Vaccines Swelling   Reports of swelling of the arm and then sick for 10 days      Medication List       Accurate as of March 31, 2019  9:48 AM. If you have any questions, ask your nurse or doctor.        amLODipine 10 MG tablet Commonly known as: NORVASC TAKE 1 TABLET BY MOUTH EVERY DAY   Aplenzin 522 MG Tb24 Generic drug: BuPROPion HBr Take 1 tablet by mouth daily. Take 1 tablet by mouth once daily.   Blood Pressure Cuff Misc Use as directed   gabapentin 300 MG capsule Commonly known as: NEURONTIN TAKE 1 CAPSULE BY MOUTH THREE TIMES A DAY   hydrochlorothiazide 25 MG tablet Commonly known as: HYDRODIURIL Take 0.5 tablets (12.5 mg total) by mouth daily.   INSULIN SYRINGE 1CC/31GX5/16" 31G X 5/16" 1 ML Misc Use 3 needles per day   lithium carbonate 300 MG capsule Take 300 mg by mouth daily. Take 3 tablets by mouth at bedtime.   loratadine 10 MG tablet Commonly known as: CLARITIN TAKE 1 TABLET EVERY DAY. **NOT COVERED UNDER INSURANCE   losartan 100 MG tablet Commonly known as: COZAAR TAKE 1 TABLET BY MOUTH EVERY DAY   lovastatin 20 MG tablet Commonly known as: MEVACOR TAKE 1 TABLET BY MOUTH EVERYDAY AT BEDTIME   metFORMIN 1000 MG tablet Commonly known as: GLUCOPHAGE TAKE 1 TABLET BY MOUTH 2 TIMES DAILY WITH A MEAL.   NovoLIN 70/30 (70-30) 100 UNIT/ML injection Generic drug: insulin NPH-regular Human Inject 40 units under the skin before breakfast and 10 units before supper.   omeprazole 20 MG capsule Commonly known as: PRILOSEC TAKE 1 CAPSULE BY MOUTH EVERY DAY   OneTouch Verio test strip Generic drug: glucose blood USE AS  INSTRUCTED TO CHECK BLOOD SUGAR THREE TIMES A DAY.   QUEtiapine 50 MG tablet Commonly known as: SEROQUEL Take 100 mg by mouth at bedtime. Take 3 tablets by mouth daily at bedtime.   sildenafil 50 MG tablet Commonly known as: Viagra Take 1 tablet (50 mg total) by mouth daily as needed for erectile dysfunction. Do not take more then once in 24 hours.   tiZANidine 2 MG  tablet Commonly known as: ZANAFLEX Take 1 tablet (2 mg total) by mouth at bedtime as needed for muscle spasms.       Allergies:  Allergies  Allergen Reactions  . Influenza Vaccines Swelling    Reports of swelling of the arm and then sick for 10 days    Past Medical History:  Diagnosis Date  . Bipolar II disorder - Managed by Marye Round [phone]) 05/03/2013  . Diabetes mellitus   . Hyperlipidemia   . Hypertension   . Unspecified hypothyroidism 05/03/2013    Past Surgical History:  Procedure Laterality Date  . KNEE SURGERY      Family History  Problem Relation Age of Onset  . Diabetes Mother   . Hypertension Mother   . Heart disease Father   . Heart attack Father 12    Social History:  reports that he has never smoked. He has never used smokeless tobacco. He reports that he does not drink alcohol or use drugs.    Review of Systems   HYPERTENSION:  Currently taking losartan 100 mg and Norvasc 10, HCTZ 12.5 prescribed by PCP, has upcoming appointment  Does not monitor at home   BP Readings from Last 3 Encounters:  05/20/18 130/70  03/26/18 (!) 150/90  03/23/18 100/72         LIPIDS: He has had marked increase in triglycerides previously, now improved.  Currently only on 20 mg lovastatin, also followed by PCP        Lab Results  Component Value Date   CHOL 123 12/20/2018   HDL 31.20 (L) 12/20/2018   LDLCALC 69 12/20/2018   LDLDIRECT 73.0 09/01/2017   TRIG 117.0 12/20/2018   CHOLHDL 4 12/20/2018       Thyroid:  Has Previously had mild hypothyroidism for a few years, likely  when he was taking lithium TSH subsequently has been normal consistently without taking any levothyroxine Is on lithium long-term  Lab Results  Component Value Date   TSH 1.16 08/18/2018        He has had some numbness in his feet especially on the right side, using gabapentin only as needed Using elastic stockings No recent foot exam  He had foot exam from PCP showing:   Patchy decrease in sensation in the toes especially right and decreased on the plantar surface also   LABS:  Lab on 03/28/2019  Component Date Value Ref Range Status  . Sodium 03/28/2019 141  135 - 145 mEq/L Final  . Potassium 03/28/2019 4.0  3.5 - 5.1 mEq/L Final  . Chloride 03/28/2019 107  96 - 112 mEq/L Final  . CO2 03/28/2019 29  19 - 32 mEq/L Final  . Glucose, Bld 03/28/2019 125* 70 - 99 mg/dL Final  . BUN 03/28/2019 17  6 - 23 mg/dL Final  . Creatinine, Ser 03/28/2019 1.47  0.40 - 1.50 mg/dL Final  . GFR 03/28/2019 48.37* >60.00 mL/min Final  . Calcium 03/28/2019 9.4  8.4 - 10.5 mg/dL Final  . Hgb A1c MFr Bld 03/28/2019 6.1  4.6 - 6.5 % Final   Glycemic Control Guidelines for People with Diabetes:Non Diabetic:  <6%Goal of Therapy: <7%Additional Action Suggested:  >8%     Physical Examination:  There were no vitals taken for this visit.    ASSESSMENT/PLAN:   Diabetes type 2, on insulin  See history of present illness for detailed discussion of his current management, blood sugar patterns and problems identified  His blood sugar control is much better with A1c  6.1 compared to 8.4  As discussed above usually gets better control when he is consistent with diet and cutting back on portions and carbohydrates Also recently exercising on his elliptical machine Although his weight is better than a year ago he has not lost any recently  Discussed importance of checking blood sugars after meals to help adjust his insulin and to determine the need to use mealtime insulin at suppertime He will continue  the same regimen for now Discussed that if he is planning to eat a large meal or more carbohydrates at dinnertime he can take additional 10 to 20 units of his NovoLog mix insulin before starting to eat rather than after eating Continue regular exercise  Recommended that he go into the office to see his PCP to monitor his blood pressure and get a foot exam    There are no Patient Instructions on file for this visit.    Elayne Snare 03/31/2019, 9:48 AM   Note: This office note was prepared with Dragon voice recognition system technology. Any transcriptional errors that result from this process are unintentional.

## 2019-04-05 ENCOUNTER — Other Ambulatory Visit: Payer: Self-pay

## 2019-04-06 ENCOUNTER — Ambulatory Visit (INDEPENDENT_AMBULATORY_CARE_PROVIDER_SITE_OTHER): Payer: Medicare HMO | Admitting: Family Medicine

## 2019-04-06 ENCOUNTER — Encounter: Payer: Self-pay | Admitting: Family Medicine

## 2019-04-06 VITALS — BP 108/64 | HR 104 | Temp 97.2°F | Ht 74.0 in | Wt 284.0 lb

## 2019-04-06 DIAGNOSIS — R6889 Other general symptoms and signs: Secondary | ICD-10-CM | POA: Diagnosis not present

## 2019-04-06 DIAGNOSIS — E785 Hyperlipidemia, unspecified: Secondary | ICD-10-CM

## 2019-04-06 DIAGNOSIS — E1159 Type 2 diabetes mellitus with other circulatory complications: Secondary | ICD-10-CM | POA: Diagnosis not present

## 2019-04-06 DIAGNOSIS — I10 Essential (primary) hypertension: Secondary | ICD-10-CM | POA: Diagnosis not present

## 2019-04-06 DIAGNOSIS — I152 Hypertension secondary to endocrine disorders: Secondary | ICD-10-CM

## 2019-04-06 DIAGNOSIS — Z23 Encounter for immunization: Secondary | ICD-10-CM

## 2019-04-06 DIAGNOSIS — F3160 Bipolar disorder, current episode mixed, unspecified: Secondary | ICD-10-CM

## 2019-04-06 DIAGNOSIS — E1142 Type 2 diabetes mellitus with diabetic polyneuropathy: Secondary | ICD-10-CM | POA: Diagnosis not present

## 2019-04-06 MED ORDER — LITHIUM CARBONATE 300 MG PO CAPS: 900.0000 mg | ORAL_CAPSULE | Freq: Every day | ORAL | Status: DC

## 2019-04-06 NOTE — Progress Notes (Signed)
Matthew Barnes DOB: 1955/04/22 Encounter date: 04/06/2019  This isa 64 y.o. male who presents to establish care. Chief Complaint  Patient presents with  . Establish Care    History of present illness: Has been getting hands that are really cold - even with gloves on. Not painful, just cold. Seems to just be like this all the time. Definitely cold in morning, but even with wearing gloves seem slow to warm up. Toes and feet don't feel cold, but does have neuropathy. No color changes in the hand - except maybe notes some whiteness.   Type 2 diabetes: Followed by Dr. Dwyane Dee.  Most recently seen on 12/31.  A1c is well controlled with most recent at 6.1.  Currently on Novolin 70/30 40 units in the morning and 10 units at supper, Metformin 1000 twice daily  Hypertension: On amlodipine 10 mg, hydrochlorothiazide 12.5 mg, losartan 100 [id number] check at home. Has been exercising regularly at home on elyptical.   Bipolar 2 disorder: On Seroquel 100 mg at bedtime, bupropion, lithium 900mg  daily. This is hard time of year for him. Sees therapist and psychiatrist regularly. Tries to read regularly and stays busy with work.   Hyperlipidemia: Lovastatin 20 mg daily.  No problems with taking medication.  Lipid Panel     Component Value Date/Time   CHOL 123 12/20/2018 1128   TRIG 117.0 12/20/2018 1128   HDL 31.20 (L) 12/20/2018 1128   CHOLHDL 4 12/20/2018 1128   VLDL 23.4 12/20/2018 1128   LDLCALC 69 12/20/2018 1128   LDLDIRECT 73.0 09/01/2017 1054    Allergies: Claritin in past; no longer taking and sx are well controlled.  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 Does follow with Dr. Alvan Dame for this.   [facility] for psychiatry.   Dr. Su Hoff for podiatry (?sp)  He follows regularly for diabetic eye exam completed this last fall.  Past Medical History:  Diagnosis Date  . Bipolar II disorder - Managed by Marye Round [phone]) 05/03/2013  . Diabetes mellitus   .  Hyperlipidemia   . Hypertension   . Unspecified hypothyroidism 05/03/2013   Past Surgical History:  Procedure Laterality Date  . KNEE SURGERY     scope on left knee   Allergies  Allergen Reactions  . Influenza Vaccines Swelling    Reports of swelling of the arm and then sick for 10 days   Current Meds  Medication Sig  . amLODipine (NORVASC) 10 MG tablet TAKE 1 TABLET BY MOUTH EVERY DAY  . Blood Pressure Monitoring (BLOOD PRESSURE CUFF) MISC Use as directed  . BuPROPion HBr (APLENZIN) 522 MG TB24 Take 1 tablet by mouth daily. Take 1 tablet by mouth once daily.  Marland Kitchen gabapentin (NEURONTIN) 300 MG capsule TAKE 1 CAPSULE BY MOUTH THREE TIMES A DAY  . hydrochlorothiazide (HYDRODIURIL) 25 MG tablet Take 0.5 tablets (12.5 mg total) by mouth daily.  . insulin NPH-regular Human (NOVOLIN 70/30) (70-30) 100 UNIT/ML injection Inject 40 units under the skin before breakfast and 10 units before supper.  . Insulin Syringe-Needle U-100 (INSULIN SYRINGE 1CC/31GX5/16") 31G X 5/16" 1 ML MISC Use 3 needles per day  . lithium carbonate 300 MG capsule Take 3 capsules (900 mg total) by mouth daily.  Marland Kitchen losartan (COZAAR) 100 MG tablet TAKE 1 TABLET BY MOUTH EVERY DAY  . lovastatin (MEVACOR) 20 MG tablet TAKE 1 TABLET BY MOUTH EVERYDAY AT BEDTIME (Patient taking differently: Take 20 mg by mouth daily. )  . metFORMIN (GLUCOPHAGE) 1000 MG tablet  TAKE 1 TABLET BY MOUTH 2 TIMES DAILY WITH A MEAL.  Marland Kitchen omeprazole (PRILOSEC) 20 MG capsule TAKE 1 CAPSULE BY MOUTH EVERY DAY  . ONETOUCH VERIO test strip USE AS INSTRUCTED TO CHECK BLOOD SUGAR THREE TIMES A DAY.  . QUEtiapine (SEROQUEL) 100 MG tablet Take 300 mg by mouth at bedtime.  . sildenafil (VIAGRA) 50 MG tablet Take 1 tablet (50 mg total) by mouth daily as needed for erectile dysfunction. Do not take more then once in 24 hours.  . [DISCONTINUED] lithium carbonate 300 MG capsule Take 300 mg by mouth daily. Take 3 tablets by mouth at bedtime.  . [DISCONTINUED] loratadine  (CLARITIN) 10 MG tablet TAKE 1 TABLET EVERY DAY. **NOT COVERED UNDER INSURANCE  . [DISCONTINUED] QUEtiapine (SEROQUEL) 50 MG tablet Take 100 mg by mouth at bedtime. Take 3 tablets by mouth daily at bedtime.  . [DISCONTINUED] tiZANidine (ZANAFLEX) 2 MG tablet Take 1 tablet (2 mg total) by mouth at bedtime as needed for muscle spasms.   Social History   Tobacco Use  . Smoking status: Never Smoker  . Smokeless tobacco: Never Used  Substance Use Topics  . Alcohol use: No   Family History  Problem Relation Age of Onset  . Diabetes Mother   . Hypertension Mother   . Heart disease Mother   . Heart disease Father   . Heart attack Father 36  . Diabetes Father   . Hypertension Father   . Depression Father   . Diabetes Sister   . Depression Paternal Grandmother   . Depression Other      Review of Systems  Constitutional: Negative for chills, fatigue and fever.  Respiratory: Negative for cough, chest tightness, shortness of breath and wheezing.   Cardiovascular: Negative for chest pain, palpitations and leg swelling.    Objective:  BP 108/64 (BP Location: Left Arm, Patient Position: Sitting, Cuff Size: Large)   Pulse (!) 104   Temp (!) 97.2 F (36.2 C) (Temporal)   Ht 6\' 2"  (1.88 m)   Wt 284 lb (128.8 kg)   SpO2 97%   BMI 36.46 kg/m   Weight: 284 lb (128.8 kg)   BP Readings from Last 3 Encounters:  04/06/19 108/64  05/20/18 130/70  03/26/18 (!) 150/90   Wt Readings from Last 3 Encounters:  04/06/19 284 lb (128.8 kg)  05/20/18 (!) 306 lb 9.6 oz (139.1 kg)  03/26/18 298 lb (135.2 kg)    Physical Exam Constitutional:      General: He is not in acute distress.    Appearance: He is well-developed.  Cardiovascular:     Rate and Rhythm: Normal rate and regular rhythm.     Heart sounds: Normal heart sounds. No murmur. No friction rub.  Pulmonary:     Effort: Pulmonary effort is normal. No respiratory distress.     Breath sounds: Normal breath sounds. No wheezing or  rales.  Musculoskeletal:        General: Tenderness: .diagmed.     Right lower leg: No edema.     Left lower leg: No edema.  Neurological:     Mental Status: He is alert and oriented to person, place, and time.  Psychiatric:        Behavior: Behavior normal.     Assessment/Plan:  1. DM type 2 with diabetic peripheral neuropathy ([place]) Follows with Dr. Dwyane Dee.  Blood sugars have been well controlled.  He has been working on regular exercise. - Hemoglobin A1c; Future  2. Hypertension associated with diabetes (  [place]) Well-controlled.  Continue current medications. - CBC with Differential/Platelet; Future - Comprehensive metabolic panel; Future  3. Hyperlipidemia, unspecified hyperlipidemia type Well-controlled.  Continue current medications.  We will plan to recheck blood work end of March/beginning of April.  This will align with endocrinology rechecking blood work as well. - Lipid panel; Future  4. Bipolar affective, mixed ([place]) He has struggled with mood through the years.  He has had a hard time finding medication where he feels that mood is completely well controlled.  This is a more difficult time of the year for him.  He is following up regularly with psychiatry as well as his therapist.  He does take medications as prescribed.  He is exercising on a daily basis. - lithium carbonate 300 MG capsule; Take 3 capsules (900 mg total) by mouth daily.  5. Cold intolerance of hand Circulation appears intact for upper and lower extremities.  We discussed putting on gloves prior to going outside.  We discussed some blood work to check with next set of labs that may contribute to more cold intolerance.  If worsening of symptoms or new symptoms (pain, discoloration) he will let me know. - TSH; Future - IBC + Ferritin; Future  6. Need for Tdap vaccination Tdap needed.  Coverage is better for him to complete at the pharmacy due to [facility] insurance.  We will advised to have completed  there.  Return for bloodwork end of march or first week april; followed by complete physical with me.  Micheline Rough, MD

## 2019-04-12 DIAGNOSIS — F3132 Bipolar disorder, current episode depressed, moderate: Secondary | ICD-10-CM | POA: Diagnosis not present

## 2019-05-03 DIAGNOSIS — E1140 Type 2 diabetes mellitus with diabetic neuropathy, unspecified: Secondary | ICD-10-CM | POA: Diagnosis not present

## 2019-05-03 DIAGNOSIS — M2042 Other hammer toe(s) (acquired), left foot: Secondary | ICD-10-CM | POA: Diagnosis not present

## 2019-05-03 DIAGNOSIS — G5762 Lesion of plantar nerve, left lower limb: Secondary | ICD-10-CM | POA: Diagnosis not present

## 2019-05-03 DIAGNOSIS — G5761 Lesion of plantar nerve, right lower limb: Secondary | ICD-10-CM | POA: Diagnosis not present

## 2019-05-24 DIAGNOSIS — F3132 Bipolar disorder, current episode depressed, moderate: Secondary | ICD-10-CM | POA: Diagnosis not present

## 2019-06-06 ENCOUNTER — Other Ambulatory Visit: Payer: Self-pay | Admitting: Endocrinology

## 2019-06-06 DIAGNOSIS — M25551 Pain in right hip: Secondary | ICD-10-CM | POA: Diagnosis not present

## 2019-06-06 DIAGNOSIS — M7061 Trochanteric bursitis, right hip: Secondary | ICD-10-CM | POA: Diagnosis not present

## 2019-06-11 ENCOUNTER — Other Ambulatory Visit: Payer: Self-pay | Admitting: Endocrinology

## 2019-06-14 ENCOUNTER — Other Ambulatory Visit: Payer: Self-pay

## 2019-06-14 ENCOUNTER — Encounter: Payer: Self-pay | Admitting: Family Medicine

## 2019-06-14 ENCOUNTER — Ambulatory Visit (INDEPENDENT_AMBULATORY_CARE_PROVIDER_SITE_OTHER): Payer: Medicare HMO | Admitting: Family Medicine

## 2019-06-14 VITALS — BP 110/72 | HR 92 | Temp 97.2°F | Ht 73.0 in | Wt 288.2 lb

## 2019-06-14 DIAGNOSIS — R29818 Other symptoms and signs involving the nervous system: Secondary | ICD-10-CM | POA: Diagnosis not present

## 2019-06-14 DIAGNOSIS — R42 Dizziness and giddiness: Secondary | ICD-10-CM

## 2019-06-14 LAB — HEPATIC FUNCTION PANEL
ALT: 30 U/L (ref 0–53)
AST: 27 U/L (ref 0–37)
Albumin: 4.2 g/dL (ref 3.5–5.2)
Alkaline Phosphatase: 42 U/L (ref 39–117)
Bilirubin, Direct: 0.1 mg/dL (ref 0.0–0.3)
Total Bilirubin: 0.6 mg/dL (ref 0.2–1.2)
Total Protein: 6.6 g/dL (ref 6.0–8.3)

## 2019-06-14 LAB — CBC WITH DIFFERENTIAL/PLATELET
Basophils Absolute: 0.1 10*3/uL (ref 0.0–0.1)
Basophils Relative: 1.1 % (ref 0.0–3.0)
Eosinophils Absolute: 0.5 10*3/uL (ref 0.0–0.7)
Eosinophils Relative: 6 % — ABNORMAL HIGH (ref 0.0–5.0)
HCT: 42.2 % (ref 39.0–52.0)
Hemoglobin: 13.9 g/dL (ref 13.0–17.0)
Lymphocytes Relative: 17.8 % (ref 12.0–46.0)
Lymphs Abs: 1.5 10*3/uL (ref 0.7–4.0)
MCHC: 33 g/dL (ref 30.0–36.0)
MCV: 85.2 fl (ref 78.0–100.0)
Monocytes Absolute: 0.5 10*3/uL (ref 0.1–1.0)
Monocytes Relative: 6.1 % (ref 3.0–12.0)
Neutro Abs: 5.8 10*3/uL (ref 1.4–7.7)
Neutrophils Relative %: 69 % (ref 43.0–77.0)
Platelets: 167 10*3/uL (ref 150.0–400.0)
RBC: 4.96 Mil/uL (ref 4.22–5.81)
RDW: 14.1 % (ref 11.5–15.5)
WBC: 8.4 10*3/uL (ref 4.0–10.5)

## 2019-06-14 LAB — BASIC METABOLIC PANEL
BUN: 25 mg/dL — ABNORMAL HIGH (ref 6–23)
CO2: 30 mEq/L (ref 19–32)
Calcium: 9.6 mg/dL (ref 8.4–10.5)
Chloride: 103 mEq/L (ref 96–112)
Creatinine, Ser: 1.74 mg/dL — ABNORMAL HIGH (ref 0.40–1.50)
GFR: 39.79 mL/min — ABNORMAL LOW (ref >60.00)
Glucose, Bld: 87 mg/dL (ref 70–99)
Potassium: 4.1 mEq/L (ref 3.5–5.1)
Sodium: 139 mEq/L (ref 135–145)

## 2019-06-14 LAB — AMMONIA: Ammonia: 46 umol/L — ABNORMAL HIGH (ref 11–35)

## 2019-06-14 LAB — VITAMIN B12: Vitamin B-12: 785 pg/mL (ref 211–911)

## 2019-06-14 LAB — TSH: TSH: 1.94 u[IU]/mL (ref 0.35–4.50)

## 2019-06-14 NOTE — Telephone Encounter (Signed)
-----   Message from Oval Linsey sent at 06/13/2019  8:22 AM EDT ----- Regarding: FW: Needs appointment LMTCB and sent mychart message. ----- Message ----- From: Elayne Snare, MD Sent: 06/12/2019   9:14 PM EDT To: [facility] Admin Pool Subject: Needs appointment                              Please schedule his diabetes follow-up with labs

## 2019-06-14 NOTE — Progress Notes (Signed)
   Subjective:    Patient ID: Matthew Barnes, male    DOB: 11-Feb-1956, 64 y.o.   MRN: [id number]  HPI Here for one month of unsteadiness on his feet and dizziness. He has had vertigo a few times in his life but this seems to be different to him. He says he has to walk slowly and think about where he places his feet, which he has never had to do before. He often has to hold onto a wall or furniture to steady himself. He has fallen once but he did not injure himself. The room does not seem to spin. When he sits he feels fine. This all happens when he stands up or walks. He can drive his car without difficulty. No headache. No slurred speech or vision problems. No recent medication changes. His last A1c in December was 6.1. his BP has been stable.    Review of Systems  Constitutional: Negative.   HENT: Negative.   Eyes: Negative.   Respiratory: Negative.   Cardiovascular: Negative.   Gastrointestinal: Negative.   Genitourinary: Negative.   Neurological: Positive for dizziness and light-headedness. Negative for tremors, seizures, syncope, facial asymmetry, speech difficulty, weakness, numbness and headaches.       Objective:   Physical Exam Constitutional:      General: He is not in acute distress.    Comments: He walks slowly and carefully, and he is a bit unsteady   Eyes:     Extraocular Movements: Extraocular movements intact.     Conjunctiva/sclera: Conjunctivae normal.     Pupils: Pupils are equal, round, and reactive to light.  Cardiovascular:     Rate and Rhythm: Normal rate and regular rhythm.     Pulses: Normal pulses.     Heart sounds: Normal heart sounds.  Pulmonary:     Effort: Pulmonary effort is normal.     Breath sounds: Normal breath sounds.  Musculoskeletal:     Cervical back: Normal range of motion.  Lymphadenopathy:     Cervical: No cervical adenopathy.  Neurological:     Mental Status: He is alert and oriented to person, place, and time.     Cranial Nerves: No  cranial nerve deficit.     Motor: No weakness.     Gait: Gait abnormal.           Assessment & Plan:  He has had one month of unsteadiness on his feet, this does not seem to be simple vertigo. We will get labs today. Set up a contrasted brain MRI soon. Refer to Neurology.  Alysia Penna, MD

## 2019-06-14 NOTE — Telephone Encounter (Signed)
Making this staff message into a telephone note

## 2019-06-15 ENCOUNTER — Encounter: Payer: Self-pay | Admitting: Neurology

## 2019-06-20 ENCOUNTER — Telehealth: Payer: Self-pay | Admitting: Family Medicine

## 2019-06-20 NOTE — Telephone Encounter (Signed)
Patient saw Dr Sarajane Jews last week about falling-- he has fallen 4 times since then.  Dr Sarajane Jews has sent the patient for an MRI, however he can't get in until May 15th.

## 2019-06-21 NOTE — Telephone Encounter (Signed)
I agree that sounds concerning. Neuro appointment isn't until May, but MRI should not need to wait that long. Can you please see if this can be facilitated before the end of the week? If order needs to be changed to stat then ok to do so under my name.

## 2019-06-22 NOTE — Telephone Encounter (Signed)
I called the pt and informed him of the message below and a teams message was sent to Neoma Laming to check on the MRI being scheduled this week.  Patient is aware someone will call with appt info for the MRI.

## 2019-06-27 ENCOUNTER — Other Ambulatory Visit: Payer: Self-pay | Admitting: Family Medicine

## 2019-06-28 DIAGNOSIS — F3132 Bipolar disorder, current episode depressed, moderate: Secondary | ICD-10-CM | POA: Diagnosis not present

## 2019-06-28 NOTE — Telephone Encounter (Signed)
Pt called to check on the MRI that he needs. Informed pt that the referral was sent and they have been trying to reach him. Provided the office number for him to call and schedule [phone]

## 2019-07-07 ENCOUNTER — Other Ambulatory Visit: Payer: Self-pay | Admitting: Endocrinology

## 2019-07-07 DIAGNOSIS — M25551 Pain in right hip: Secondary | ICD-10-CM | POA: Diagnosis not present

## 2019-07-07 DIAGNOSIS — M25552 Pain in left hip: Secondary | ICD-10-CM | POA: Diagnosis not present

## 2019-07-07 DIAGNOSIS — M7061 Trochanteric bursitis, right hip: Secondary | ICD-10-CM | POA: Diagnosis not present

## 2019-07-07 DIAGNOSIS — M7062 Trochanteric bursitis, left hip: Secondary | ICD-10-CM | POA: Diagnosis not present

## 2019-07-08 ENCOUNTER — Other Ambulatory Visit (INDEPENDENT_AMBULATORY_CARE_PROVIDER_SITE_OTHER): Payer: Medicare HMO

## 2019-07-08 ENCOUNTER — Other Ambulatory Visit: Payer: Self-pay

## 2019-07-08 DIAGNOSIS — E669 Obesity, unspecified: Secondary | ICD-10-CM

## 2019-07-08 DIAGNOSIS — E1169 Type 2 diabetes mellitus with other specified complication: Secondary | ICD-10-CM

## 2019-07-08 LAB — BASIC METABOLIC PANEL
BUN: 17 mg/dL (ref 6–23)
CO2: 29 mEq/L (ref 19–32)
Calcium: 9.6 mg/dL (ref 8.4–10.5)
Chloride: 105 mEq/L (ref 96–112)
Creatinine, Ser: 1.3 mg/dL (ref 0.40–1.50)
GFR: 55.7 mL/min — ABNORMAL LOW (ref >60.00)
Glucose, Bld: 128 mg/dL — ABNORMAL HIGH (ref 70–99)
Potassium: 4.3 mEq/L (ref 3.5–5.1)
Sodium: 140 mEq/L (ref 135–145)

## 2019-07-08 LAB — HEMOGLOBIN A1C: Hgb A1c MFr Bld: 5.8 % (ref 4.6–6.5)

## 2019-07-11 ENCOUNTER — Other Ambulatory Visit: Payer: Self-pay

## 2019-07-11 ENCOUNTER — Encounter: Payer: Self-pay | Admitting: Family Medicine

## 2019-07-11 ENCOUNTER — Ambulatory Visit (INDEPENDENT_AMBULATORY_CARE_PROVIDER_SITE_OTHER): Payer: Medicare HMO | Admitting: Family Medicine

## 2019-07-11 VITALS — BP 120/80 | HR 83 | Temp 97.2°F | Ht 73.0 in | Wt 284.9 lb

## 2019-07-11 DIAGNOSIS — R296 Repeated falls: Secondary | ICD-10-CM

## 2019-07-11 DIAGNOSIS — E785 Hyperlipidemia, unspecified: Secondary | ICD-10-CM

## 2019-07-11 DIAGNOSIS — E1159 Type 2 diabetes mellitus with other circulatory complications: Secondary | ICD-10-CM

## 2019-07-11 DIAGNOSIS — E1142 Type 2 diabetes mellitus with diabetic polyneuropathy: Secondary | ICD-10-CM

## 2019-07-11 DIAGNOSIS — R29898 Other symptoms and signs involving the musculoskeletal system: Secondary | ICD-10-CM

## 2019-07-11 DIAGNOSIS — H81399 Other peripheral vertigo, unspecified ear: Secondary | ICD-10-CM

## 2019-07-11 DIAGNOSIS — I10 Essential (primary) hypertension: Secondary | ICD-10-CM

## 2019-07-11 DIAGNOSIS — Z0000 Encounter for general adult medical examination without abnormal findings: Secondary | ICD-10-CM | POA: Diagnosis not present

## 2019-07-11 DIAGNOSIS — R7989 Other specified abnormal findings of blood chemistry: Secondary | ICD-10-CM | POA: Diagnosis not present

## 2019-07-11 DIAGNOSIS — I152 Hypertension secondary to endocrine disorders: Secondary | ICD-10-CM

## 2019-07-11 DIAGNOSIS — R42 Dizziness and giddiness: Secondary | ICD-10-CM

## 2019-07-11 LAB — LIPID PANEL
Cholesterol: 128 mg/dL (ref 0–200)
HDL: 32.6 mg/dL — ABNORMAL LOW (ref >39.00)
LDL Cholesterol: 68 mg/dL (ref 0–99)
NonHDL: 94.93
Total CHOL/HDL Ratio: 4
Triglycerides: 137 mg/dL (ref 0.0–149.0)
VLDL: 27.4 mg/dL (ref 0.0–40.0)

## 2019-07-11 LAB — AMMONIA: Ammonia: 41 umol/L — ABNORMAL HIGH (ref 11–35)

## 2019-07-11 NOTE — Progress Notes (Signed)
Matthew Barnes DOB: 23-Jan-1956 Encounter date: 07/11/2019  This is a 64 y.o. male who presents for complete physical   History of present illness/Additional concerns: Recently saw Dr. Sarajane Barnes for dizziness: still present. Has had it in the past as well. Sometimes with turning head. Has had 4 falls in the last 5 weeks. Was walking when falls happened. Just losing strength in right leg. Feels like it is issue with lower leg. Limb just feels kind of limp. Can't even hold self up when this happens. Not able to get up after fall. Last fall EMS had to come help him up. After further questioning states that right arm is affected as well which makes getting back up more difficult. No headaches or vision changes. No known injury prior to this. Notes the dizziness just with turning head. Has MRI scheduled for 4/26 (ordered by Dr. Sarajane Barnes at last visit). Does have arthritis in knees; they are bone on bone; but haven't been hurting him. Did just get cortisone shots in both hips (he thinks bursa) with Dr. Alvan Barnes.   Type 2 diabetes: Followed by Dr. Dwyane Barnes.  Last A1C 4/9 was 5.8  Hypertension: On amlodipine 10 mg, hydrochlorothiazide 12.5 mg, losartan 100 [id number] check at home. Has been exercising regularly at home on elyptical - strength feels normal when he is using this.   Bipolar 2 disorder: On Seroquel 100 mg at bedtime, bupropion, lithium 900mg  daily.  Sees therapist and psychiatrist regularly. Mood is always a struggle. He just keeps moving, working which helps with stability.  Hyperlipidemia: Lovastatin 20 mg daily.  No problems with taking medication.  Matthew Barnes for psychiatry.    Past Medical History:  Diagnosis Date  . Bipolar II disorder - Managed by Matthew Barnes [phone]) 05/03/2013  . Diabetes mellitus   . Hyperlipidemia   . Hypertension   . Unspecified hypothyroidism 05/03/2013   Past Surgical History:  Procedure Laterality Date  . KNEE SURGERY     scope on left knee   Allergies   Allergen Reactions  . Influenza Vaccines Swelling    Reports of swelling of the arm and then sick for 10 days   Current Meds  Medication Sig  . amLODipine (NORVASC) 10 MG tablet TAKE 1 TABLET BY MOUTH EVERY DAY  . Blood Pressure Monitoring (BLOOD PRESSURE CUFF) MISC Use as directed  . gabapentin (NEURONTIN) 300 MG capsule TAKE 1 CAPSULE BY MOUTH THREE TIMES A DAY  . hydrochlorothiazide (HYDRODIURIL) 25 MG tablet Take 0.5 tablets (12.5 mg total) by mouth daily.  . Insulin Syringe-Needle U-100 (INSULIN SYRINGE 1CC/31GX5/16") 31G X 5/16" 1 ML MISC Use 3 needles per day  . lithium carbonate 300 MG capsule Take 3 capsules (900 mg total) by mouth daily.  Marland Kitchen losartan (COZAAR) 100 MG tablet TAKE 1 TABLET BY MOUTH EVERY DAY  . lovastatin (MEVACOR) 20 MG tablet TAKE 1 TABLET BY MOUTH EVERYDAY AT BEDTIME (Patient taking differently: Take 20 mg by mouth daily. )  . metFORMIN (GLUCOPHAGE) 1000 MG tablet TAKE 1 TABLET BY MOUTH 2 TIMES DAILY WITH A MEAL.  Marland Kitchen NOVOLIN 70/30 (70-30) 100 UNIT/ML injection INJECT 40 UNITS UNDER THE SKIN BEFORE BREAKFAST AND 10 UNITS BEFORE SUPPER.  Marland Kitchen omeprazole (PRILOSEC) 20 MG capsule TAKE 1 CAPSULE BY MOUTH EVERY DAY  . ONETOUCH VERIO test strip USE AS INSTRUCTED TO CHECK BLOOD SUGAR THREE TIMES A DAY.  . QUEtiapine (SEROQUEL) 100 MG tablet Take 400 mg by mouth at bedtime.   . sildenafil (VIAGRA) 50 MG tablet Take  1 tablet (50 mg total) by mouth daily as needed for erectile dysfunction. Do not take more then once in 24 hours.  . [DISCONTINUED] BuPROPion HBr (APLENZIN) 522 MG TB24 Take 1 tablet by mouth daily. Take 1 tablet by mouth once daily.   Social History   Tobacco Use  . Smoking status: Never Smoker  . Smokeless tobacco: Never Used  Substance Use Topics  . Alcohol use: No   Family History  Problem Relation Age of Onset  . Diabetes Mother   . Hypertension Mother   . Heart disease Mother   . Heart disease Father   . Heart attack Father 21  . Diabetes Father    . Hypertension Father   . Depression Father   . Diabetes Sister   . Depression Paternal Grandmother   . Depression Other      Review of Systems  Constitutional: Negative for chills, fatigue and fever.  Respiratory: Negative for cough, chest tightness, shortness of breath and wheezing.   Cardiovascular: Negative for chest pain, palpitations and leg swelling.  Neurological: Positive for dizziness (just with episodes; see hpi), weakness (see hpi) and numbness (just with episodes; see hpi).    CBC:  Lab Results  Component Value Date   WBC 8.4 06/14/2019   HGB 13.9 06/14/2019   HCT 42.2 06/14/2019   MCH 27.3 04/18/2011   MCHC 33.0 06/14/2019   RDW 14.1 06/14/2019   PLT 167.0 06/14/2019   CMP: Lab Results  Component Value Date   NA 140 07/08/2019   K 4.3 07/08/2019   CL 105 07/08/2019   CO2 29 07/08/2019   GLUCOSE 128 (H) 07/08/2019   BUN 17 07/08/2019   CREATININE 1.30 07/08/2019   GFRAA >90 04/18/2011   CALCIUM 9.6 07/08/2019   PROT 6.6 06/14/2019   BILITOT 0.6 06/14/2019   ALKPHOS 42 06/14/2019   ALT 30 06/14/2019   AST 27 06/14/2019   LIPID: Lab Results  Component Value Date   CHOL 128 07/11/2019   TRIG 137.0 07/11/2019   HDL 32.60 (L) 07/11/2019   LDLCALC 68 07/11/2019    Objective:  BP 120/80 (BP Location: Right Arm, Patient Position: Sitting, Cuff Size: Large)   Pulse 83   Temp (!) 97.2 F (36.2 C) (Temporal)   Ht 6\' 1"  (1.854 m)   Wt 284 lb 14.4 oz (129.2 kg)   SpO2 95%   BMI 37.59 kg/m   Weight: 284 lb 14.4 oz (129.2 kg)   BP Readings from Last 3 Encounters:  07/14/19 136/86  07/11/19 120/80  06/14/19 110/72   Wt Readings from Last 3 Encounters:  07/14/19 283 lb 12.8 oz (128.7 kg)  07/11/19 284 lb 14.4 oz (129.2 kg)  06/14/19 288 lb 3.2 oz (130.7 kg)    Physical Exam Constitutional:      General: He is not in acute distress.    Appearance: He is well-developed. He is not diaphoretic.  HENT:     Head: Normocephalic and atraumatic.      Right Ear: Tympanic membrane, ear canal and external ear normal.     Left Ear: Tympanic membrane, ear canal and external ear normal.  Eyes:     Conjunctiva/sclera: Conjunctivae normal.     Pupils: Pupils are equal, Barnes, and reactive to light.  Neck:     Thyroid: No thyromegaly.  Cardiovascular:     Rate and Rhythm: Normal rate and regular rhythm.     Heart sounds: Normal heart sounds. No murmur. No friction rub. No gallop.  Pulmonary:     Effort: Pulmonary effort is normal. No respiratory distress.     Breath sounds: Normal breath sounds. No wheezing or rales.  Musculoskeletal:     Cervical back: Neck supple.  Lymphadenopathy:     Cervical: No cervical adenopathy.  Skin:    General: Skin is warm and dry.  Neurological:     Mental Status: He is alert and oriented to person, place, and time.     Cranial Nerves: No cranial nerve deficit or facial asymmetry.     Sensory: Sensory deficit (chronic neuropathy) present.     Motor: Tremor (bilat upper extremities; hand on left and thumb and first finger right) present. No weakness or abnormal muscle tone.     Deep Tendon Reflexes: Reflexes normal.     Reflex Scores:      Tricep reflexes are 1+ on the right side and 1+ on the left side.      Bicep reflexes are 1+ on the right side and 1+ on the left side.      Brachioradialis reflexes are 1+ on the right side and 1+ on the left side.      Patellar reflexes are 1+ on the right side and 1+ on the left side.    Comments: I cannot elicit babinski bilat  Psychiatric:        Behavior: Behavior normal.     Assessment/Plan: There are no preventive care reminders to display for this patient. Health Maintenance reviewed.  1. Preventative health care Keep up with regular exercise. More concerned today with neurologic symptoms which we will work on getting promptly evaluated.  2. Dizziness Neuro referral ordered; we are going to see if we can get MRI completed this week.   3. Right  leg weakness See above. Needs MRI evaluation.   4. Frequent falls - MR BRAIN WO CONTRAST; Future  5. Other peripheral vertigo, unspecified ear - MR BRAIN WO CONTRAST; Future  6. Hyperlipidemia, unspecified hyperlipidemia type lovastatin - Lipid panel; Future - Lipid panel  7. DM type 2 with diabetic peripheral neuropathy ([place]) Following with endo. Has been well controlled.  8. Hypertension associated with diabetes ([place]) Well controlled. Continue current medication.   9. Increased ammonia level - Ammonia; Future - Ammonia  Return for pending labs, imaging.  Micheline Rough, MD

## 2019-07-13 ENCOUNTER — Ambulatory Visit
Admission: RE | Admit: 2019-07-13 | Discharge: 2019-07-13 | Disposition: A | Discharge status: Home / Self Care | Payer: Medicare HMO | Source: Ambulatory Visit | Attending: Family Medicine | Admitting: Family Medicine

## 2019-07-13 ENCOUNTER — Other Ambulatory Visit: Payer: Self-pay

## 2019-07-13 DIAGNOSIS — R29818 Other symptoms and signs involving the nervous system: Secondary | ICD-10-CM

## 2019-07-13 DIAGNOSIS — R42 Dizziness and giddiness: Secondary | ICD-10-CM

## 2019-07-13 IMAGING — MR MR HEAD WO/W CM
12 series · 48 of 48 positions shown · IV contrast (multihance)
Comparison: None

CLINICAL DATA: Dizziness

EXAM:
MRI HEAD WITHOUT AND WITH CONTRAST
TECHNIQUE: Multiplanar, multiecho pulse sequences of the brain and surrounding
structures were obtained without and with intravenous contrast.
CONTRAST:  20mL MULTIHANCE GADOBENATE DIMEGLUMINE 529 MG/ML IV SOLN

[Series 5: T1 · sagittal · 4.0mm · 0.75mm/px · 1 of 31 slices shown (1 of 3)]
[im 1/31]
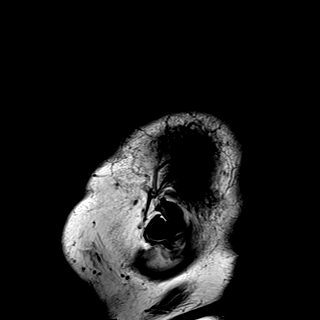

[Series 6: DWI · axial · 3.0mm · 1.44mm/px · z∈[-45,+91]mm · 4 of 88 slices shown (1 of 4)]
[im 1/88]
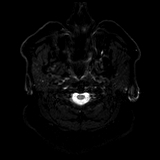
[im 30/88]
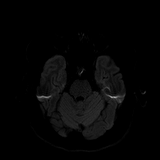
[im 59/88]
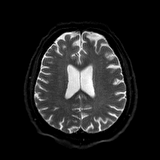
[im 88/88]
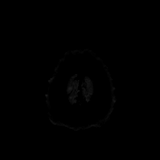

[Series 7: DWI · axial · 3.0mm · 1.44mm/px · z∈[-45,+91]mm · 3 of 44 slices shown (2 of 4)]
[im 1/44]
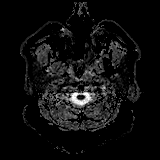
[im 22/44]
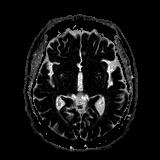
[im 44/44]
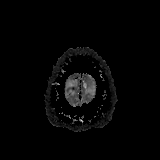

[Series 8: DWI · coronal · 5.0mm · 1.44mm/px · 4 of 60 slices shown (3 of 4)]
[im 1/60]
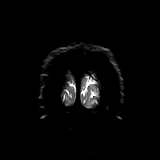
[im 20/60]
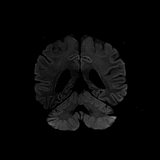
[im 40/60]
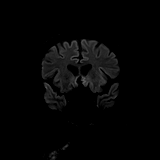
[im 60/60]
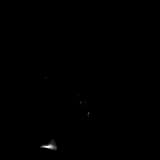

[Series 9: DWI · coronal · 5.0mm · 1.44mm/px · 2 of 30 slices shown (4 of 4)]
[im 1/30]
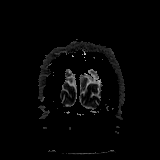
[im 30/30]
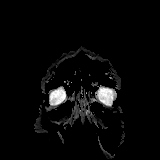

[Series 10: T2 · axial · 4.0mm · 0.36mm/px · z∈[-44,+100]mm · 2 of 30 slices shown]
[im 1/30]
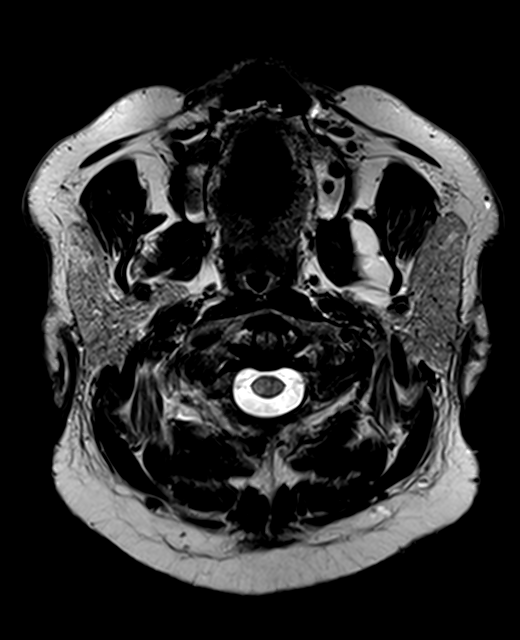
[im 30/30]
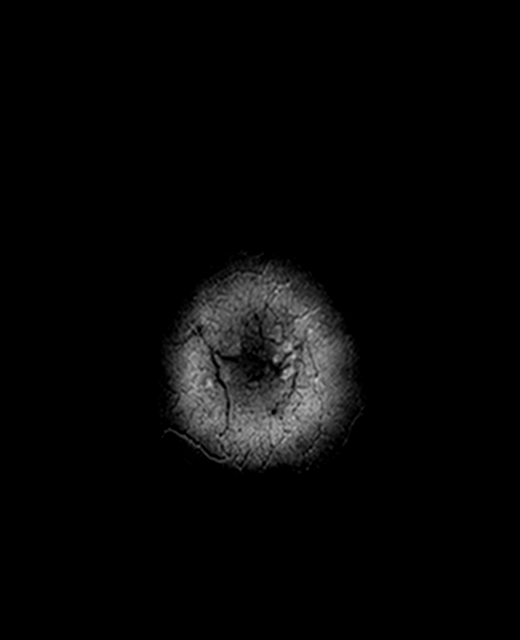

[Series 11: FLAIR · axial · 3.0mm · 0.72mm/px · z∈[-42,+101]mm · 2 of 26 slices shown]
[im 1/26]
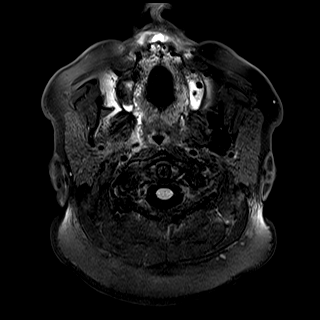
[im 26/26]
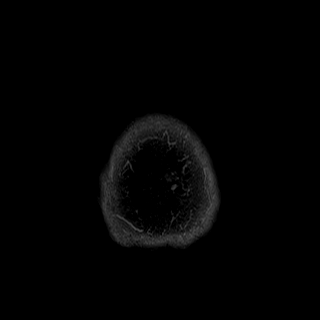

[Series 13: swi_images · axial · 1.5mm · 0.90mm/px · z∈[-40,+96]mm · 6 of 96 slices shown]
[im 1/96]
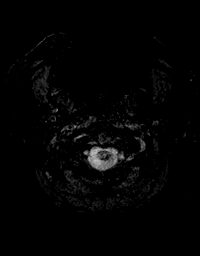
[im 20/96]
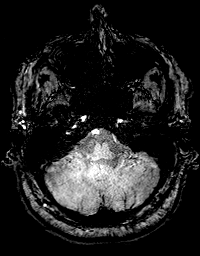
[im 39/96]
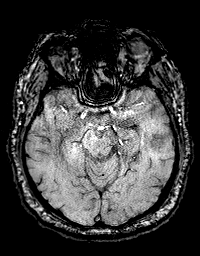
[im 58/96]
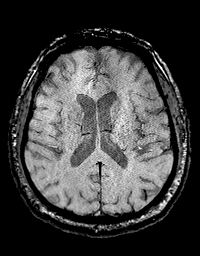
[im 77/96]
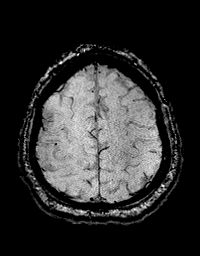
[im 96/96]
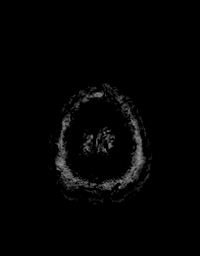

[Series 14: T1 · axial · 1.0mm · 0.94mm/px · z∈[-46,+106]mm · 10 of 160 slices shown (2 of 3)]
[im 1/160]
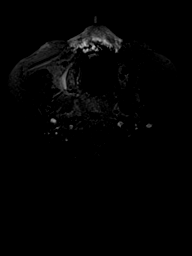
[im 18/160]
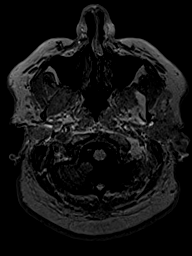
[im 36/160]
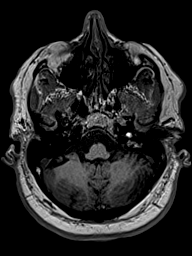
[im 54/160]
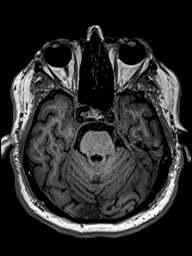
[im 71/160]
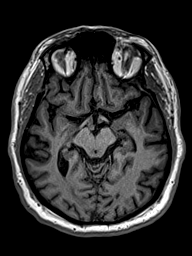
[im 89/160]
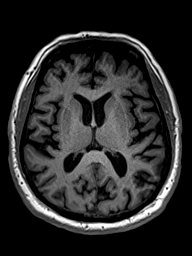
[im 107/160]
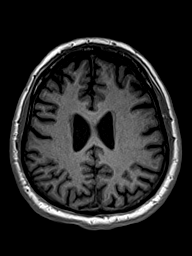
[im 124/160]
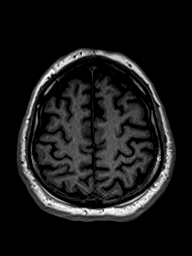
[im 142/160]
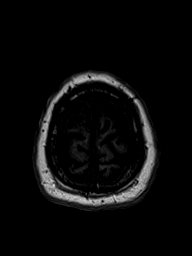
[im 160/160]
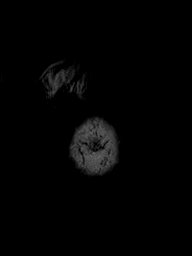

[Series 15: T2 post-contrast · coronal · 4.0mm · 0.36mm/px · 2 of 34 slices shown]
[im 1/34]
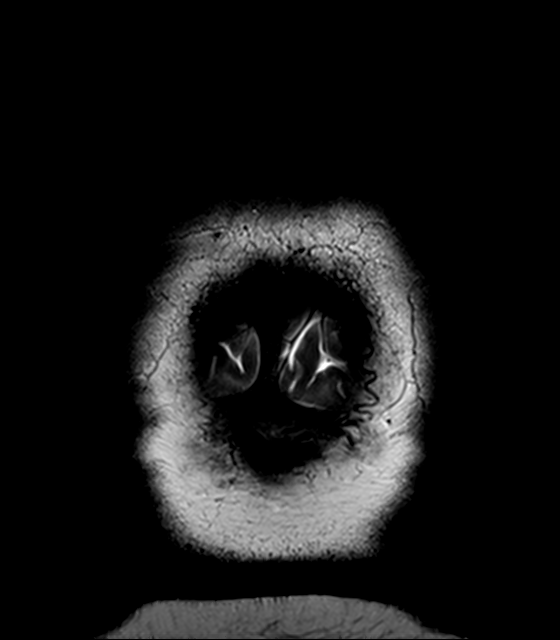
[im 34/34]
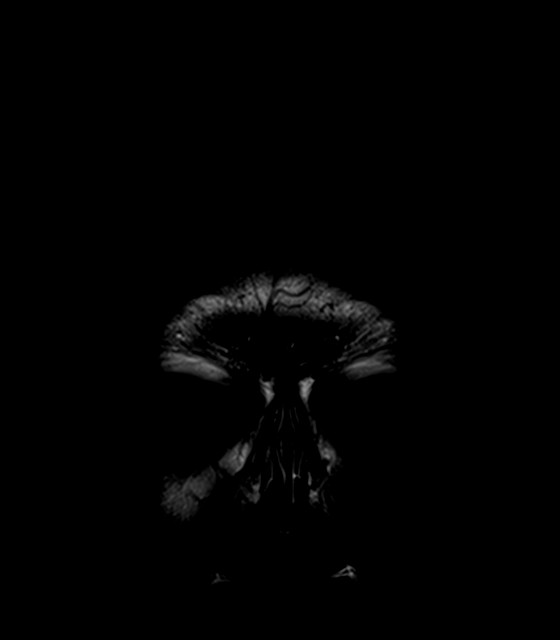

[Series 16: T1 · axial · 1.0mm · 0.94mm/px · z∈[-46,+106]mm · 10 of 160 slices shown (3 of 3)]
[im 1/160]
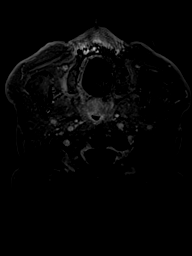
[im 18/160]
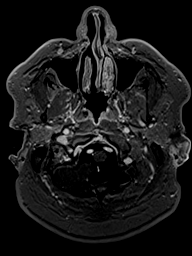
[im 36/160]
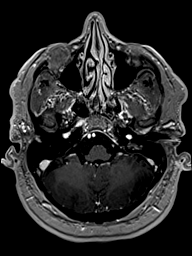
[im 54/160]
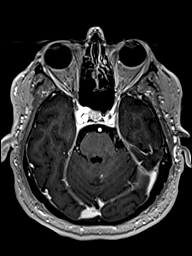
[im 71/160]
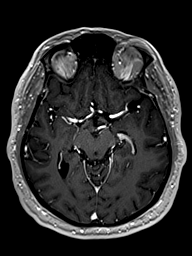
[im 89/160]
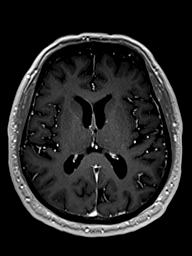
[im 107/160]
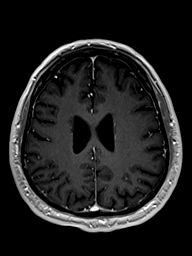
[im 124/160]
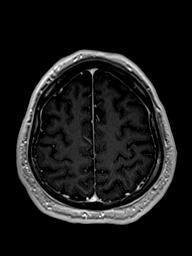
[im 142/160]
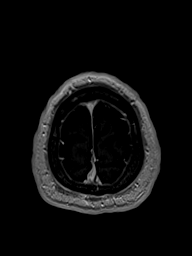
[im 160/160]
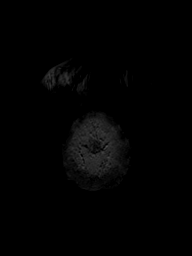

[Series 17: T1 post-contrast · coronal · 4.0mm · 0.72mm/px · 2 of 34 slices shown]
[im 1/34]
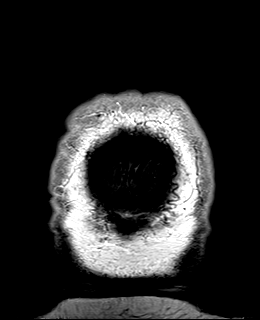
[im 34/34]
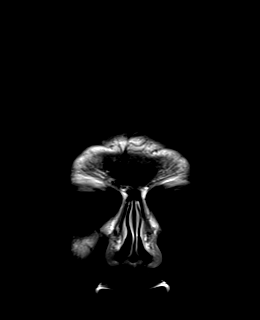

[48 of 48 positions shown; findings below may reference images not displayed]

FINDINGS: Brain: There is no acute infarction or intracranial hemorrhage.
There is no intracranial mass, mass effect, or edema. There is no
hydrocephalus or extra-axial fluid collection. No abnormal
enhancement.

Minimal patchy T2 hyperintensity in the supratentorial white matter
is nonspecific but may reflect minor chronic microvascular ischemic
changes. Prominence of the ventricles and sulci reflects mild
generalized parenchymal volume loss.

Vascular: Major vessel flow voids at the skull base are preserved.

Skull and upper cervical spine: Normal marrow signal is preserved.

Sinuses/Orbits: Paranasal sinuses are aerated. Orbits are
unremarkable.

Other: Sella is unremarkable.  Mastoid air cells are clear.
IMPRESSION: No evidence of recent infarction, hemorrhage, or mass. Minor chronic
microvascular ischemic changes.

## 2019-07-13 MED ORDER — GADOBENATE DIMEGLUMINE 529 MG/ML IV SOLN
20.0000 mL | Freq: Once | INTRAVENOUS | Status: AC | PRN
  Administered 2019-07-13: 20 mL via INTRAVENOUS

## 2019-07-14 ENCOUNTER — Encounter: Payer: Self-pay | Admitting: Endocrinology

## 2019-07-14 ENCOUNTER — Ambulatory Visit: Payer: Medicare HMO | Admitting: Endocrinology

## 2019-07-14 VITALS — BP 136/86 | HR 90 | Ht 73.0 in | Wt 283.8 lb

## 2019-07-14 DIAGNOSIS — E1140 Type 2 diabetes mellitus with diabetic neuropathy, unspecified: Secondary | ICD-10-CM | POA: Diagnosis not present

## 2019-07-14 DIAGNOSIS — E1169 Type 2 diabetes mellitus with other specified complication: Secondary | ICD-10-CM | POA: Diagnosis not present

## 2019-07-14 DIAGNOSIS — E782 Mixed hyperlipidemia: Secondary | ICD-10-CM

## 2019-07-14 DIAGNOSIS — Z794 Long term (current) use of insulin: Secondary | ICD-10-CM

## 2019-07-14 DIAGNOSIS — E669 Obesity, unspecified: Secondary | ICD-10-CM | POA: Diagnosis not present

## 2019-07-14 LAB — MICROALBUMIN / CREATININE URINE RATIO
Creatinine,U: 68.9 mg/dL
Microalb Creat Ratio: 1.6 mg/g (ref 0.0–30.0)
Microalb, Ur: 1.1 mg/dL (ref 0.0–1.9)

## 2019-07-14 NOTE — Addendum Note (Signed)
Addended by: Kaylyn Lim I on: 07/14/2019 03:13 PM   Modules accepted: Orders

## 2019-07-14 NOTE — Progress Notes (Signed)
Patient ID: Matthew Barnes, male   DOB: 11-11-1955, 64 y.o.   MRN: [id number]    Reason for Appointment: Followup for Type 2 Diabetes    History of Present Illness:          Diagnosis: Type 2 diabetes mellitus, date of diagnosis:   2011       Past history:  He was started on metformin at diagnosis and apparently did have fairly good control initially However about a year later because of poor control he was given insulin in addition. He had been taking Lantus insulin until about 2/15, up to 80 units a day However he does not think his blood sugars  Were controlled with this Because of higher A1c of 13.1% he was referred here for diabetes management in 6/15; was started on glucose monitoring and insulin was changed from low dose Levemir to Humalog mix 70 units a day He was also started on Victoza  to help with blood sugar control and facilitate weight loss on his initial consultation  Recent history:   INSULIN regimen is described as: Novolin mix 70/30, 40 units at breakfast, 10 units in p.m. as needed  Oral hypoglycemic drugs the patient is taking are: Metformin 1 g twice a day  A1c is consistently near 6% and now 5.8    Current blood sugar patterns, management of diabetes and problems:  He still has lower A1c that predicted by his blood sugars even though he checks only rarely at home  Last visit was in December  He has tried to continue using his elliptical or other exercise but his weight has gone down about 5 pounds recently  Not clear why some of his readings are higher in the morning but he may be having some snacks later at night  He is not walking as much because of difficulty with balance  Usually taking his morning insulin consistently before breakfast  Lab glucose 128 fasting       Side effects from medications have been: None  Glucose monitoring:  done about 1 times a day or less       Glucometer:  Verio   Blood Glucose readings from download of his readings  on monitor   PRE-MEAL Fasting Lunch Dinner Bedtime Overall  Glucose range:  97-171   95-323    Median  135   180   129   Previous readings:  FASTING blood sugar readings since 12/18: Range 112-188  PREVIOUS FASTING range 134-235  Glycemic control:   Lab Results  Component Value Date   HGBA1C 5.8 07/08/2019   HGBA1C 6.1 03/28/2019   HGBA1C 8.4 (H) 12/20/2018   Lab Results  Component Value Date   MICROALBUR 3.0 (H) 08/18/2018   LDLCALC 68 07/11/2019   CREATININE 1.30 07/08/2019    Self-care: The diet that the patient has been following is: None, unable to control portions and carbs  when depressed  Meals: 3 meals per day (dinner 6 pm-10 pm)        Dietician visit: Most recent: never      CDE visit: 08/2013             Weight history: baseline 295  Wt Readings from Last 3 Encounters:  07/14/19 283 lb 12.8 oz (128.7 kg)  07/11/19 284 lb 14.4 oz (129.2 kg)  06/14/19 288 lb 3.2 oz (130.7 kg)   Office Visit on 07/11/2019  Component Date Value Ref Range Status  . Cholesterol 07/11/2019 128  0 - 200 mg/dL  Final   ATP III Classification       Desirable:  < 200 mg/dL               Borderline High:  200 - 239 mg/dL          High:  > = 240 mg/dL  . Triglycerides 07/11/2019 137.0  0.0 - 149.0 mg/dL Final   Normal:  <150 mg/dLBorderline High:  150 - 199 mg/dL  . HDL 07/11/2019 32.60* >39.00 mg/dL Final  . VLDL 07/11/2019 27.4  0.0 - 40.0 mg/dL Final  . LDL Cholesterol 07/11/2019 68  0 - 99 mg/dL Final  . Total CHOL/HDL Ratio 07/11/2019 4   Final                  Men          Women1/2 Average Risk     3.4          3.3Average Risk          5.0          4.42X Average Risk          9.6          7.13X Average Risk          15.0          11.0                      . NonHDL 07/11/2019 94.93   Final   NOTE:  Non-HDL goal should be 30 mg/dL higher than patient's LDL goal (i.e. LDL goal of < 70 mg/dL, would have non-HDL goal of < 100 mg/dL)  . Ammonia 07/11/2019 41* 11 - 35 umol/L Final   Lab on 07/08/2019  Component Date Value Ref Range Status  . Sodium 07/08/2019 140  135 - 145 mEq/L Final  . Potassium 07/08/2019 4.3  3.5 - 5.1 mEq/L Final  . Chloride 07/08/2019 105  96 - 112 mEq/L Final  . CO2 07/08/2019 29  19 - 32 mEq/L Final  . Glucose, Bld 07/08/2019 128* 70 - 99 mg/dL Final  . BUN 07/08/2019 17  6 - 23 mg/dL Final  . Creatinine, Ser 07/08/2019 1.30  0.40 - 1.50 mg/dL Final  . GFR 07/08/2019 55.70* >60.00 mL/min Final  . Calcium 07/08/2019 9.6  8.4 - 10.5 mg/dL Final  . Hgb A1c MFr Bld 07/08/2019 5.8  4.6 - 6.5 % Final   Glycemic Control Guidelines for People with Diabetes:Non Diabetic:  <6%Goal of Therapy: <7%Additional Action Suggested:  >8%       Allergies as of 07/14/2019      Reactions   Influenza Vaccines Swelling   Reports of swelling of the arm and then sick for 10 days      Medication List       Accurate as of July 14, 2019  3:07 PM. If you have any questions, ask your nurse or doctor.        STOP taking these medications   Aplenzin 522 MG Tb24 Generic drug: BuPROPion HBr Stopped by: Elayne Snare, MD     TAKE these medications   amLODipine 10 MG tablet Commonly known as: NORVASC TAKE 1 TABLET BY MOUTH EVERY DAY   Blood Pressure Cuff Misc Use as directed   gabapentin 300 MG capsule Commonly known as: NEURONTIN TAKE 1 CAPSULE BY MOUTH THREE TIMES A DAY   hydrochlorothiazide 25 MG tablet Commonly known as: HYDRODIURIL Take 0.5 tablets (12.5 mg total) by mouth  daily.   INSULIN SYRINGE 1CC/31GX5/16" 31G X 5/16" 1 ML Misc Use 3 needles per day   lithium carbonate 300 MG capsule Take 3 capsules (900 mg total) by mouth daily.   losartan 100 MG tablet Commonly known as: COZAAR TAKE 1 TABLET BY MOUTH EVERY DAY   lovastatin 20 MG tablet Commonly known as: MEVACOR TAKE 1 TABLET BY MOUTH EVERYDAY AT BEDTIME What changed: See the new instructions.   metFORMIN 1000 MG tablet Commonly known as: GLUCOPHAGE TAKE 1 TABLET BY MOUTH 2  TIMES DAILY WITH A MEAL.   NovoLIN 70/30 (70-30) 100 UNIT/ML injection Generic drug: insulin NPH-regular Human INJECT 40 UNITS UNDER THE SKIN BEFORE BREAKFAST AND 10 UNITS BEFORE SUPPER.   omeprazole 20 MG capsule Commonly known as: PRILOSEC TAKE 1 CAPSULE BY MOUTH EVERY DAY   OneTouch Verio test strip Generic drug: glucose blood USE AS INSTRUCTED TO CHECK BLOOD SUGAR THREE TIMES A DAY.   QUEtiapine 100 MG tablet Commonly known as: SEROQUEL Take 400 mg by mouth at bedtime.   sildenafil 50 MG tablet Commonly known as: Viagra Take 1 tablet (50 mg total) by mouth daily as needed for erectile dysfunction. Do not take more then once in 24 hours.       Allergies:  Allergies  Allergen Reactions  . Influenza Vaccines Swelling    Reports of swelling of the arm and then sick for 10 days    Past Medical History:  Diagnosis Date  . Bipolar II disorder - Managed by Marye Round [phone]) 05/03/2013  . Diabetes mellitus   . Hyperlipidemia   . Hypertension   . Unspecified hypothyroidism 05/03/2013    Past Surgical History:  Procedure Laterality Date  . KNEE SURGERY     scope on left knee    Family History  Problem Relation Age of Onset  . Diabetes Mother   . Hypertension Mother   . Heart disease Mother   . Heart disease Father   . Heart attack Father 62  . Diabetes Father   . Hypertension Father   . Depression Father   . Diabetes Sister   . Depression Paternal Grandmother   . Depression Other     Social History:  reports that he has never smoked. He has never used smokeless tobacco. He reports that he does not drink alcohol or use drugs.    Review of Systems   HYPERTENSION:  Currently taking losartan 100 mg and Norvasc 10, HCTZ 12.5 prescribed by PCP, blood pressure was better at the last 2 measurements   BP Readings from Last 3 Encounters:  07/14/19 136/86  07/11/19 120/80  06/14/19 110/72         LIPIDS: He has had marked increase in  triglycerides previously, now improved.  Currently only on 20 mg lovastatin, also followed by PCP        Lab Results  Component Value Date   CHOL 128 07/11/2019   HDL 32.60 (L) 07/11/2019   LDLCALC 68 07/11/2019   LDLDIRECT 73.0 09/01/2017   TRIG 137.0 07/11/2019   CHOLHDL 4 07/11/2019       Thyroid:  Previously had mild hypothyroidism for a few years, likely when he was taking lithium TSH subsequently has been normal consistently without taking any levothyroxine Is on lithium long-term  Lab Results  Component Value Date   TSH 1.94 06/14/2019        He has had some numbness in his feet, more on the right side, using gabapentin only as needed Using  elastic stockings   He had foot exam today showing:   Patchy decrease in sensation in the toes especially right and decreased on the plantar surface also   LABS:  Office Visit on 07/11/2019  Component Date Value Ref Range Status  . Cholesterol 07/11/2019 128  0 - 200 mg/dL Final   ATP III Classification       Desirable:  < 200 mg/dL               Borderline High:  200 - 239 mg/dL          High:  > = 240 mg/dL  . Triglycerides 07/11/2019 137.0  0.0 - 149.0 mg/dL Final   Normal:  <150 mg/dLBorderline High:  150 - 199 mg/dL  . HDL 07/11/2019 32.60* >39.00 mg/dL Final  . VLDL 07/11/2019 27.4  0.0 - 40.0 mg/dL Final  . LDL Cholesterol 07/11/2019 68  0 - 99 mg/dL Final  . Total CHOL/HDL Ratio 07/11/2019 4   Final                  Men          Women1/2 Average Risk     3.4          3.3Average Risk          5.0          4.42X Average Risk          9.6          7.13X Average Risk          15.0          11.0                      . NonHDL 07/11/2019 94.93   Final   NOTE:  Non-HDL goal should be 30 mg/dL higher than patient's LDL goal (i.e. LDL goal of < 70 mg/dL, would have non-HDL goal of < 100 mg/dL)  . Ammonia 07/11/2019 41* 11 - 35 umol/L Final  Lab on 07/08/2019  Component Date Value Ref Range Status  . Sodium 07/08/2019 140  135  - 145 mEq/L Final  . Potassium 07/08/2019 4.3  3.5 - 5.1 mEq/L Final  . Chloride 07/08/2019 105  96 - 112 mEq/L Final  . CO2 07/08/2019 29  19 - 32 mEq/L Final  . Glucose, Bld 07/08/2019 128* 70 - 99 mg/dL Final  . BUN 07/08/2019 17  6 - 23 mg/dL Final  . Creatinine, Ser 07/08/2019 1.30  0.40 - 1.50 mg/dL Final  . GFR 07/08/2019 55.70* >60.00 mL/min Final  . Calcium 07/08/2019 9.6  8.4 - 10.5 mg/dL Final  . Hgb A1c MFr Bld 07/08/2019 5.8  4.6 - 6.5 % Final   Glycemic Control Guidelines for People with Diabetes:Non Diabetic:  <6%Goal of Therapy: <7%Additional Action Suggested:  >8%     Physical Examination:  BP 136/86 (BP Location: Left Arm, Patient Position: Sitting, Cuff Size: Large)   Pulse 90   Ht 6\' 1"  (1.854 m)   Wt 283 lb 12.8 oz (128.7 kg)   SpO2 96%   BMI 37.44 kg/m     Diabetic Foot Exam - Simple   Simple Foot Form Diabetic Foot exam was performed with the following findings: Yes   Visual Inspection No deformities, no ulcerations, no other skin breakdown bilaterally: Yes Sensation Testing See comments: Yes Pulse Check Posterior Tibialis and Dorsalis pulse intact bilaterally: Yes Comments Decreased to nearly absent monofilament sensation distally on  the right toes especially great toe and plantar surfaces    Vibration sense moderately reduced in the distal toes  ASSESSMENT/PLAN:   Diabetes type 2, on insulin  See history of present illness for detailed discussion of his current management, blood sugar patterns and problems identified  His blood sugar control is excellent with A1c around 6%  As before his blood sugars are higher than predicted by his A1c and averaging recently about 130 at home He does not check readings after his meals His weight is slightly improved further As before he thinks he does better with his diabetes management when he has control of his depression and diet  Recommendations:  Check readings after lunch or dinner on a regular  basis  If blood sugars are over 180 after dinner may need to take at least 10 units of insulin before dinner  Continue regular exercise  Avoid high carbohydrate meals and a lot of fruit  Check feet daily  BALANCE issues: May be related to neuropathy  LIPIDS: Excellent except low HDL, needs weight loss  Patient Instructions  Check blood sugars on waking up 3 days a week  Also check blood sugars about 2 hours after meals and do this after different meals by rotation  Recommended blood sugar levels on waking up are 90-130 and about 2 hours after meal is 130-160  Please bring your blood sugar monitor to each visit, thank you         Elayne Snare 07/14/2019, 3:07 PM   Note: This office note was prepared with Dragon voice recognition system technology. Any transcriptional errors that result from this process are unintentional.

## 2019-07-14 NOTE — Patient Instructions (Signed)
Check blood sugars on waking up 3 days a week  Also check blood sugars about 2 hours after meals and do this after different meals by rotation  Recommended blood sugar levels on waking up are 90-130 and about 2 hours after meal is 130-160  Please bring your blood sugar monitor to each visit, thank you   

## 2019-07-25 ENCOUNTER — Other Ambulatory Visit: Payer: Medicare HMO

## 2019-07-30 ENCOUNTER — Other Ambulatory Visit: Payer: Self-pay | Admitting: Endocrinology

## 2019-08-04 DIAGNOSIS — M545 Low back pain: Secondary | ICD-10-CM | POA: Diagnosis not present

## 2019-08-04 DIAGNOSIS — R278 Other lack of coordination: Secondary | ICD-10-CM | POA: Diagnosis not present

## 2019-08-10 NOTE — Progress Notes (Signed)
NEUROLOGY CONSULTATION NOTE  Matthew Barnes MRN: [id number] DOB: 03/30/1956  Referring provider: Alysia Penna, MD Primary care provider: Micheline Rough, MD  Reason for consult:  Dizziness but his primary issue is balance.  HISTORY OF PRESENT ILLNESS: Matthew Barnes is a 64 year old right-handed male with HTN, diabetes, and Bipolar II disorder who presents for dizziness.  History supplemented by referring provider note.  He has longstanding history of peripheral neuropathy due to diabetes, which has been controlled.  Hgb A1c in December was 6.1.  In February, he started having problems with balance.  He is much more cautious when he is on his feet.  Usually, he notes that his right leg gives out.  When walking, his right leg seems turned outward.  Earlier this year, he had right hip pain and was seen by ortho where he was diagnosed with right trochanteric bursitis.  X-rays of right hip reportedly okay.  He had X-ray of lumbosacral spine as well.  He received injections and pain has resolved but still with balance problems.  No back or radicular pain down legs.  He has prior history of vertigo but there is no associated dizziness when he is on his feet.  For a couple of years, he has had tremor in his hands, which have gradually progressed.  It is most pronounced when he is holding an object.  Denies double vision, slurred speech, or trouble swallowing.    MRI of brain with and without contrast on 07/13/2019 personally reviewed showed minimal chronic small vessel ischemic changes in the cerebral white matter without involvement of brainstem and cerebellum and no acute intracranial abnormality  PAST MEDICAL HISTORY: Past Medical History:  Diagnosis Date  . Bipolar II disorder - Managed by Matthew Barnes [phone]) 05/03/2013  . Diabetes mellitus   . Hyperlipidemia   . Hypertension   . Unspecified hypothyroidism 05/03/2013    PAST SURGICAL HISTORY: Past Surgical History:  Procedure  Laterality Date  . KNEE SURGERY     scope on left knee    MEDICATIONS: Current Outpatient Medications on File Prior to Visit  Medication Sig Dispense Refill  . amLODipine (NORVASC) 10 MG tablet TAKE 1 TABLET BY MOUTH EVERY DAY 90 tablet 2  . Blood Pressure Monitoring (BLOOD PRESSURE CUFF) MISC Use as directed 1 each 0  . gabapentin (NEURONTIN) 300 MG capsule TAKE 1 CAPSULE BY MOUTH THREE TIMES A DAY 270 capsule 0  . hydrochlorothiazide (HYDRODIURIL) 25 MG tablet Take 0.5 tablets (12.5 mg total) by mouth daily. 45 tablet 3  . Insulin Syringe-Needle U-100 (INSULIN SYRINGE 1CC/31GX5/16") 31G X 5/16" 1 ML MISC Use 3 needles per day 100 each 3  . lithium carbonate 300 MG capsule Take 3 capsules (900 mg total) by mouth daily.    Marland Kitchen losartan (COZAAR) 100 MG tablet TAKE 1 TABLET BY MOUTH EVERY DAY 90 tablet 1  . lovastatin (MEVACOR) 20 MG tablet TAKE 1 TABLET BY MOUTH EVERYDAY AT BEDTIME (Patient taking differently: Take 20 mg by mouth daily. ) 90 tablet 1  . metFORMIN (GLUCOPHAGE) 1000 MG tablet TAKE 1 TABLET BY MOUTH 2 TIMES DAILY WITH A MEAL. 180 tablet 0  . NOVOLIN 70/30 (70-30) 100 UNIT/ML injection INJECT 40 UNITS UNDER THE SKIN BEFORE BREAKFAST AND 10 UNITS BEFORE SUPPER. 20 mL 0  . omeprazole (PRILOSEC) 20 MG capsule TAKE 1 CAPSULE BY MOUTH EVERY DAY 90 capsule 0  . ONETOUCH VERIO test strip USE AS INSTRUCTED TO CHECK BLOOD SUGAR THREE TIMES A DAY.  300 each 1  . QUEtiapine (SEROQUEL) 100 MG tablet Take 400 mg by mouth at bedtime.     . sildenafil (VIAGRA) 50 MG tablet Take 1 tablet (50 mg total) by mouth daily as needed for erectile dysfunction. Do not take more then once in 24 hours. 10 tablet 3   No current facility-administered medications on file prior to visit.    ALLERGIES: Allergies  Allergen Reactions  . Influenza Vaccines Swelling    Reports of swelling of the arm and then sick for 10 days    FAMILY HISTORY: Family History  Problem Relation Age of Onset  . Diabetes Mother    . Hypertension Mother   . Heart disease Mother   . Heart disease Father   . Heart attack Father 37  . Diabetes Father   . Hypertension Father   . Depression Father   . Diabetes Sister   . Depression Paternal Grandmother   . Depression Other    SOCIAL HISTORY: Social History   Socioeconomic History  . Marital status: Single    Spouse name: Not on file  . Number of children: Not on file  . Years of education: Not on file  . Highest education level: Not on file  Occupational History  . Not on file  Tobacco Use  . Smoking status: Never Smoker  . Smokeless tobacco: Never Used  Substance and Sexual Activity  . Alcohol use: No  . Drug use: No  . Sexual activity: Never  Other Topics Concern  . Not on file  Social History Narrative   Work or School: on disability for knee arthritis      Home Situation: lives alone      Spiritual Beliefs:Christian      Lifestyle:no regular exercising; diet not great            Social Determinants of Health   Financial Resource Strain:   . Difficulty of Paying Living Expenses:   Food Insecurity:   . Worried About Charity fundraiser in the Last Year:   . Arboriculturist in the Last Year:   Transportation Needs:   . Film/video editor (Medical):   Marland Kitchen Lack of Transportation (Non-Medical):   Physical Activity:   . Days of Exercise per Week:   . Minutes of Exercise per Session:   Stress:   . Feeling of Stress :   Social Connections:   . Frequency of Communication with Friends and Family:   . Frequency of Social Gatherings with Friends and Family:   . Attends Religious Services:   . Active Member of Clubs or Organizations:   . Attends Archivist Meetings:   Marland Kitchen Marital Status:   Intimate Partner Violence:   . Fear of Current or Ex-Partner:   . Emotionally Abused:   Marland Kitchen Physically Abused:   . Sexually Abused:     PHYSICAL EXAM: Blood pressure (!) 150/82, pulse 71, height 6\' 1"  (1.854 m), weight 290 lb 8 oz (131.8  kg), SpO2 98 %. General: No acute distress.  Patient appears well-groomed.  Head:  Normocephalic/atraumatic Eyes:  fundi examined but not visualized Neck: supple, no paraspinal tenderness, full range of motion Back: No paraspinal tenderness Heart: regular rate and rhythm Lungs: Clear to auscultation bilaterally. Vascular: No carotid bruits. Neurological Exam: Mental status: alert and oriented to person, place, and time, recent and remote memory intact, fund of knowledge intact, attention and concentration intact, speech fluent and not dysarthric, language intact. Cranial nerves: CN I:  not tested CN II: pupils equal, Barnes and reactive to light, visual fields intact CN III, IV, VI:  full range of motion, no nystagmus, no ptosis CN V: facial sensation intact CN VII: upper and lower face symmetric CN VIII: hearing intact CN IX, X: gag intact, uvula midline CN XI: sternocleidomastoid and trapezius muscles intact CN XII: tongue midline Bulk & Tone: normal, no fasciculations.  No rigidity Motor:  5/5 throughout.  Resting and action tremor of both hands.  No bradykinesia Sensation:  Pinprick and vibration sensation reduced in feet up to knees. Deep Tendon Reflexes:  1+ upper extremities, absent lower extremities, toes downgoing.  Finger to nose testing:  Without dysmetria.  Heel to shin:  Without dysmetria.  Gait:  Mildly wide-based, external rotation of right leg.  Able to turn.  Some difficulty tandem walk.  Romberg negataive.  IMPRESSION: 1.  He does exhibit mixed resting and action tremor.  It does have a Parkinsonian quality but he does not meet criteria for Parkinson's disease.  He does not exhibit bradykinesia or rigidity.   2.  Unsteady gait/falls.  His right hip appears externally rotated.  This may be due to decreased mobility of the lateral rotator muscle group when he previously had hip pain.  I suspect physical therapy may help with this and his gait.  Gait is also complicated by  underlying neuropathy.  He does not exhibit any shuffling gait to suspect Parkinson's.  PLAN: 1.  Will obtain X-ray reports from the orthopedist. 2.  Will continue to monitor for any change in symptoms.   3.  Offered referral to Dr. Hulan Saas of Sports Medicine for evaluation and treatment with OMT of his tight right hip rotator muscles.  He defers at this time and has been exercising at home.  Still option in future if needed. 4.  Follow up in 4 months.  Thank you for allowing me to take part in the care of this patient.  Metta Clines, DO  CC:  Alysia Penna, MD  Micheline Rough, MD

## 2019-08-12 ENCOUNTER — Other Ambulatory Visit: Payer: Self-pay

## 2019-08-12 ENCOUNTER — Encounter: Payer: Self-pay | Admitting: Neurology

## 2019-08-12 ENCOUNTER — Ambulatory Visit: Payer: Medicare HMO | Admitting: Neurology

## 2019-08-12 VITALS — BP 150/82 | HR 71 | Ht 73.0 in | Wt 290.5 lb

## 2019-08-12 DIAGNOSIS — G20 Parkinson's disease: Secondary | ICD-10-CM | POA: Diagnosis not present

## 2019-08-12 DIAGNOSIS — R2681 Unsteadiness on feet: Secondary | ICD-10-CM

## 2019-08-12 NOTE — Patient Instructions (Signed)
If you want, I can refer you to Sports Medicine to stretch your hip Otherwise monitor tremor and I will see you in 4 months.

## 2019-08-25 DIAGNOSIS — F3132 Bipolar disorder, current episode depressed, moderate: Secondary | ICD-10-CM | POA: Diagnosis not present

## 2019-08-26 ENCOUNTER — Other Ambulatory Visit: Payer: Self-pay | Admitting: Endocrinology

## 2019-09-07 ENCOUNTER — Other Ambulatory Visit: Payer: Self-pay | Admitting: Endocrinology

## 2019-09-23 ENCOUNTER — Other Ambulatory Visit: Payer: Self-pay | Admitting: Family Medicine

## 2019-09-26 ENCOUNTER — Other Ambulatory Visit: Payer: Self-pay | Admitting: Endocrinology

## 2019-10-13 DIAGNOSIS — F3132 Bipolar disorder, current episode depressed, moderate: Secondary | ICD-10-CM | POA: Diagnosis not present

## 2019-10-17 ENCOUNTER — Other Ambulatory Visit: Payer: Medicare HMO

## 2019-10-18 ENCOUNTER — Other Ambulatory Visit: Payer: Self-pay

## 2019-10-18 ENCOUNTER — Other Ambulatory Visit (INDEPENDENT_AMBULATORY_CARE_PROVIDER_SITE_OTHER): Payer: Medicare HMO

## 2019-10-18 DIAGNOSIS — E669 Obesity, unspecified: Secondary | ICD-10-CM | POA: Diagnosis not present

## 2019-10-18 DIAGNOSIS — E1169 Type 2 diabetes mellitus with other specified complication: Secondary | ICD-10-CM | POA: Diagnosis not present

## 2019-10-18 LAB — BASIC METABOLIC PANEL
BUN: 13 mg/dL (ref 6–23)
CO2: 30 mEq/L (ref 19–32)
Calcium: 9.3 mg/dL (ref 8.4–10.5)
Chloride: 104 mEq/L (ref 96–112)
Creatinine, Ser: 1.37 mg/dL (ref 0.40–1.50)
GFR: 52.38 mL/min — ABNORMAL LOW (ref >60.00)
Glucose, Bld: 223 mg/dL — ABNORMAL HIGH (ref 70–99)
Potassium: 4.2 mEq/L (ref 3.5–5.1)
Sodium: 139 mEq/L (ref 135–145)

## 2019-10-18 LAB — HEMOGLOBIN A1C: Hgb A1c MFr Bld: 7 % — ABNORMAL HIGH (ref 4.6–6.5)

## 2019-10-19 ENCOUNTER — Encounter: Payer: Self-pay | Admitting: Endocrinology

## 2019-10-19 ENCOUNTER — Ambulatory Visit: Payer: Medicare HMO | Admitting: Endocrinology

## 2019-10-19 VITALS — BP 152/84 | HR 79 | Ht 73.0 in | Wt 286.2 lb

## 2019-10-19 DIAGNOSIS — E1140 Type 2 diabetes mellitus with diabetic neuropathy, unspecified: Secondary | ICD-10-CM

## 2019-10-19 DIAGNOSIS — I10 Essential (primary) hypertension: Secondary | ICD-10-CM | POA: Diagnosis not present

## 2019-10-19 DIAGNOSIS — Z8639 Personal history of other endocrine, nutritional and metabolic disease: Secondary | ICD-10-CM

## 2019-10-19 DIAGNOSIS — Z794 Long term (current) use of insulin: Secondary | ICD-10-CM

## 2019-10-19 DIAGNOSIS — E782 Mixed hyperlipidemia: Secondary | ICD-10-CM | POA: Diagnosis not present

## 2019-10-19 LAB — GLUCOSE, POCT (MANUAL RESULT ENTRY): POC Glucose: 162 mg/dl — AB (ref 70–99)

## 2019-10-19 NOTE — Patient Instructions (Addendum)
Take 20-25 units daily at dinner  Take 44 units in am daily  Check blood sugars on waking up 3-4 days a week  Also check blood sugars about 2 hours after meals and do this after different meals by rotation  Recommended blood sugar levels on waking up are 90-130 and about 2 hours after meal is 130-160  Please bring your blood sugar monitor to each visit, thank you  See PCP for BP

## 2019-10-19 NOTE — Progress Notes (Signed)
Patient ID: Matthew Barnes, male   DOB: 05/14/1955, 64 y.o.   MRN: [id number]    Reason for Appointment: Followup for Type 2 Diabetes    History of Present Illness:          Diagnosis: Type 2 diabetes mellitus, date of diagnosis:   2011       Past history:  He was started on metformin at diagnosis and apparently did have fairly good control initially However about a year later because of poor control he was given insulin in addition. He had been taking Lantus insulin until about 2/15, up to 80 units a day However he does not think his blood sugars  Were controlled with this Because of higher A1c of 13.1% he was referred here for diabetes management in 6/15; was started on glucose monitoring and insulin was changed from low dose Levemir to Humalog mix 70 units a day He was also started on Victoza  to help with blood sugar control and facilitate weight loss on his initial consultation  Recent history:   INSULIN regimen is described as: Novolin mix 70/30, 40 units at breakfast, 20 units in p.m. at times  Oral hypoglycemic drugs the patient is taking are: Metformin 1 g twice a day  A1c is 7% compared to 5.8    Current blood sugar patterns, management of diabetes and problems:  He usually has lower A1c values compared to his blood sugar readings  He thinks his A1c and blood sugars are higher because of poor diet and increasing depression  Although he thinks his blood pressure is better his blood sugars still appear to be relatively high  He is only checking blood sugars in the mornings and did not bring his monitor for download  Also did not increase his insulin despite blood sugars as high as 220  However he thinks that a lot of his extra eating and carbohydrates or sweets are being consumed during the night or late at night  He will sometimes take 20 units of the insulin at bedtime if he has gone off the diet, usually not doing this on a regular basis  Also not taking blood  sugar readings during the day  He has done some walking but not much other exercise  His weight has fluctuated although recently slightly better  Blood sugar after breakfast in the lab was 223       Side effects from medications have been: None  Glucose monitoring:  done about 1 times a day or less       Glucometer:  Verio   Blood Glucose readings from recall   PRE-MEAL Fasting Lunch Dinner Bedtime Overall  Glucose range: 160-220   ?   Mean/median:     ?   POST-MEAL PC Breakfast PC Lunch PC Dinner  Glucose range:     Mean/median:      Prior blood sugars   PRE-MEAL Fasting Lunch Dinner Bedtime Overall  Glucose range:  97-171   95-323    Median  135   180   129   Previous readings:  FASTING blood sugar readings since 12/18: Range 112-188  PREVIOUS FASTING range 134-235  Glycemic control:   Lab Results  Component Value Date   HGBA1C 7.0 (H) 10/18/2019   HGBA1C 5.8 07/08/2019   HGBA1C 6.1 03/28/2019   Lab Results  Component Value Date   MICROALBUR 1.1 07/14/2019   LDLCALC 68 07/11/2019   CREATININE 1.37 10/18/2019    Self-care: The diet that the  patient has been following is: None, unable to control portions and carbs  when depressed  Meals: 3 meals per day (dinner 6 pm-10 pm)        Dietician visit: Most recent: never      CDE visit: 08/2013             Weight history: baseline 295  Wt Readings from Last 3 Encounters:  10/19/19 286 lb 3.2 oz (129.8 kg)  08/12/19 290 lb 8 oz (131.8 kg)  07/14/19 283 lb 12.8 oz (128.7 kg)   Office Visit on 10/19/2019  Component Date Value Ref Range Status  . POC Glucose 10/19/2019 162* 70 - 99 mg/dl Final  Lab on 10/18/2019  Component Date Value Ref Range Status  . Sodium 10/18/2019 139  135 - 145 mEq/L Final  . Potassium 10/18/2019 4.2  3.5 - 5.1 mEq/L Final  . Chloride 10/18/2019 104  96 - 112 mEq/L Final  . CO2 10/18/2019 30  19 - 32 mEq/L Final  . Glucose, Bld 10/18/2019 223* 70 - 99 mg/dL Final  . BUN  10/18/2019 13  6 - 23 mg/dL Final  . Creatinine, Ser 10/18/2019 1.37  0.40 - 1.50 mg/dL Final  . GFR 10/18/2019 52.38* >60.00 mL/min Final  . Calcium 10/18/2019 9.3  8.4 - 10.5 mg/dL Final  . Hgb A1c MFr Bld 10/18/2019 7.0* 4.6 - 6.5 % Final   Glycemic Control Guidelines for People with Diabetes:Non Diabetic:  <6%Goal of Therapy: <7%Additional Action Suggested:  >8%       Allergies as of 10/19/2019      Reactions   Influenza Vaccines Swelling   Reports of swelling of the arm and then sick for 10 days      Medication List       Accurate as of October 19, 2019  2:37 PM. If you have any questions, ask your nurse or doctor.        amLODipine 10 MG tablet Commonly known as: NORVASC TAKE 1 TABLET BY MOUTH EVERY DAY   Blood Pressure Cuff Misc Use as directed   diclofenac 75 MG EC tablet Commonly known as: VOLTAREN Take 75 mg by mouth as needed.   gabapentin 300 MG capsule Commonly known as: NEURONTIN TAKE 1 CAPSULE BY MOUTH THREE TIMES A DAY   hydrochlorothiazide 25 MG tablet Commonly known as: HYDRODIURIL Take 0.5 tablets (12.5 mg total) by mouth daily.   INSULIN SYRINGE 1CC/31GX5/16" 31G X 5/16" 1 ML Misc Use 3 needles per day   lithium carbonate 300 MG capsule Take 3 capsules (900 mg total) by mouth daily.   losartan 100 MG tablet Commonly known as: COZAAR TAKE 1 TABLET BY MOUTH EVERY DAY   lovastatin 20 MG tablet Commonly known as: MEVACOR TAKE 1 TABLET BY MOUTH EVERYDAY AT BEDTIME What changed: See the new instructions.   metFORMIN 1000 MG tablet Commonly known as: GLUCOPHAGE TAKE 1 TABLET BY MOUTH 2 TIMES DAILY WITH A MEAL.   NovoLIN 70/30 (70-30) 100 UNIT/ML injection Generic drug: insulin NPH-regular Human INJECT 40 UNITS UNDER THE SKIN BEFORE BREAKFAST AND 10 UNITS BEFORE SUPPER.   omeprazole 20 MG capsule Commonly known as: PRILOSEC TAKE 1 CAPSULE BY MOUTH EVERY DAY   OneTouch Verio test strip Generic drug: glucose blood USE AS INSTRUCTED TO CHECK  BLOOD SUGAR THREE TIMES A DAY.   QUEtiapine 100 MG tablet Commonly known as: SEROQUEL Take 400 mg by mouth at bedtime.   sildenafil 50 MG tablet Commonly known as: Viagra Take 1 tablet (  50 mg total) by mouth daily as needed for erectile dysfunction. Do not take more then once in 24 hours.       Allergies:  Allergies  Allergen Reactions  . Influenza Vaccines Swelling    Reports of swelling of the arm and then sick for 10 days    Past Medical History:  Diagnosis Date  . Bipolar II disorder - Managed by Marye Round [phone]) 05/03/2013  . Diabetes mellitus   . Hyperlipidemia   . Hypertension   . Unspecified hypothyroidism 05/03/2013    Past Surgical History:  Procedure Laterality Date  . KNEE SURGERY     scope on left knee    Family History  Problem Relation Age of Onset  . Diabetes Mother   . Hypertension Mother   . Heart disease Mother   . Heart disease Father   . Heart attack Father 1  . Diabetes Father   . Hypertension Father   . Depression Father   . Diabetes Sister   . Depression Paternal Grandmother   . Depression Other     Social History:  reports that he has never smoked. He has never used smokeless tobacco. He reports that he does not drink alcohol and does not use drugs.    Review of Systems   HYPERTENSION:  Currently taking losartan 100 mg and Norvasc 10, HCTZ 12.5 prescribed by PCP, blood pressure appears consistently high recently but he has not followed up with his new PCP Has no recent problems with swelling   BP Readings from Last 3 Encounters:  10/19/19 (!) 152/84  08/12/19 (!) 150/82  07/14/19 136/86         LIPIDS: He has had marked increase in triglycerides previously, now improved.  Currently on 20 mg lovastatin, also followed by PCP        Lab Results  Component Value Date   CHOL 128 07/11/2019   HDL 32.60 (L) 07/11/2019   LDLCALC 68 07/11/2019   LDLDIRECT 73.0 09/01/2017   TRIG 137.0 07/11/2019   CHOLHDL 4  07/11/2019       Thyroid:  Previously had mild hypothyroidism for a few years TSH subsequently has been normal consistently without taking any levothyroxine Is still on lithium long-term  Lab Results  Component Value Date   TSH 1.94 06/14/2019        He has had some numbness in his feet, more on the right side, using gabapentin only as needed Using elastic stockings with some relief  Findings on last exam:   Patchy decrease in sensation in the toes especially right and decreased on the plantar surface also   LABS:  Office Visit on 10/19/2019  Component Date Value Ref Range Status  . POC Glucose 10/19/2019 162* 70 - 99 mg/dl Final  Lab on 10/18/2019  Component Date Value Ref Range Status  . Sodium 10/18/2019 139  135 - 145 mEq/L Final  . Potassium 10/18/2019 4.2  3.5 - 5.1 mEq/L Final  . Chloride 10/18/2019 104  96 - 112 mEq/L Final  . CO2 10/18/2019 30  19 - 32 mEq/L Final  . Glucose, Bld 10/18/2019 223* 70 - 99 mg/dL Final  . BUN 10/18/2019 13  6 - 23 mg/dL Final  . Creatinine, Ser 10/18/2019 1.37  0.40 - 1.50 mg/dL Final  . GFR 10/18/2019 52.38* >60.00 mL/min Final  . Calcium 10/18/2019 9.3  8.4 - 10.5 mg/dL Final  . Hgb A1c MFr Bld 10/18/2019 7.0* 4.6 - 6.5 % Final   Glycemic Control  Guidelines for People with Diabetes:Non Diabetic:  <6%Goal of Therapy: <7%Additional Action Suggested:  >8%     Physical Examination:  BP (!) 152/84 (BP Location: Left Arm, Patient Position: Sitting, Cuff Size: Large)   Pulse 79   Ht 6\' 1"  (1.854 m)   Wt 286 lb 3.2 oz (129.8 kg)   SpO2 95%   BMI 37.76 kg/m      ASSESSMENT/PLAN:   Diabetes type 2, on insulin  See history of present illness for detailed discussion of his current management, blood sugar patterns and problems identified  His blood sugar control is significantly worse As before A1c of 7% is lower than expected for his blood sugar  He has difficulty watching his diet, snacking late at night and eating more  carbohydrates as well as relatively less exercise A1c usually around 6% when he has better compliance He is able to do better with his lifestyle management and his depression is under control  However his blood sugars do not appear to be improving as yet despite some improvement in his depression As above he has inadequate blood sugar monitoring and currently inadequate insulin doses which likely need to be done twice a day regularly  Recommendations:  Check readings at least at bedtime daily  He likely will need to take 20 units regularly at dinnertime to get his overnight blood sugars better controlled  He can at least go up 4 units on his morning insulin  Regular exercise and add some more on the elliptical if he cannot walk much  Continue Metformin  May need to consider Jardiance if blood sugar control consistently difficult Needs to bring his blood sugar meter to each visit  Hypertension: Blood pressure is relatively higher, although I think this is from not sleeping well at night he needs to follow-up with his PCP  Morbid obesity: He has diabetes, hypertension and BMI over 35. As above may consider Jardiance or Ozempic even if we have to try patient assistance program  Will review again in 2 months  Patient Instructions  Take 20-25 units daily at dinner  Take 44 units in am daily  Check blood sugars on waking up 3-4 days a week  Also check blood sugars about 2 hours after meals and do this after different meals by rotation  Recommended blood sugar levels on waking up are 90-130 and about 2 hours after meal is 130-160  Please bring your blood sugar monitor to each visit, thank you  See PCP for BP      Elayne Snare 10/19/2019, 2:37 PM   Note: This office note was prepared with Dragon voice recognition system technology. Any transcriptional errors that result from this process are unintentional.

## 2019-10-24 ENCOUNTER — Telehealth: Payer: Self-pay | Admitting: Family Medicine

## 2019-10-24 NOTE — Telephone Encounter (Signed)
Refill for 6 months. 

## 2019-10-24 NOTE — Telephone Encounter (Signed)
Pt call and need a refill on hydrochlorothiazide (HYDRODIURIL) 25 MG sent to  [facility] [place], [place] Phone:  [phone]

## 2019-10-25 MED ORDER — HYDROCHLOROTHIAZIDE 25 MG PO TABS: 12.5000 mg | ORAL_TABLET | Freq: Every day | ORAL | 1 refills | Status: DC

## 2019-10-25 NOTE — Telephone Encounter (Signed)
Rx done. 

## 2019-10-30 ENCOUNTER — Other Ambulatory Visit: Payer: Self-pay | Admitting: Endocrinology

## 2019-11-03 ENCOUNTER — Telehealth: Payer: Self-pay

## 2019-11-03 NOTE — Telephone Encounter (Signed)
FAXED [place]: [place]  Document: CMN for CGM Other records requested: Office Notes  All above requested information has been faxed successfully to [employer] [facility] listed above. Documents and fax confirmation have been placed in the faxed file for future reference.

## 2019-11-27 ENCOUNTER — Other Ambulatory Visit: Payer: Self-pay | Admitting: Endocrinology

## 2019-11-28 ENCOUNTER — Other Ambulatory Visit: Payer: Self-pay | Admitting: Endocrinology

## 2019-11-28 ENCOUNTER — Other Ambulatory Visit: Payer: Self-pay | Admitting: Family Medicine

## 2019-11-29 ENCOUNTER — Other Ambulatory Visit: Payer: Self-pay

## 2019-11-29 ENCOUNTER — Ambulatory Visit (INDEPENDENT_AMBULATORY_CARE_PROVIDER_SITE_OTHER): Payer: Medicare HMO | Admitting: Family Medicine

## 2019-11-29 ENCOUNTER — Other Ambulatory Visit: Payer: Medicare HMO

## 2019-11-29 ENCOUNTER — Encounter: Payer: Self-pay | Admitting: Family Medicine

## 2019-11-29 VITALS — BP 130/70 | HR 80 | Temp 98.3°F | Ht 73.0 in | Wt 285.0 lb

## 2019-11-29 DIAGNOSIS — R109 Unspecified abdominal pain: Secondary | ICD-10-CM

## 2019-11-29 LAB — POCT URINALYSIS DIPSTICK
Bilirubin, UA: NEGATIVE
Blood, UA: NEGATIVE
Glucose, UA: NEGATIVE
Ketones, UA: NEGATIVE
Leukocytes, UA: NEGATIVE
Nitrite, UA: NEGATIVE
Protein, UA: NEGATIVE
Spec Grav, UA: 1.015 (ref 1.010–1.025)
Urobilinogen, UA: 0.2 E.U./dL
pH, UA: 7 (ref 5.0–8.0)

## 2019-11-29 MED ORDER — MELOXICAM 15 MG PO TABS: 15.0000 mg | ORAL_TABLET | Freq: Every day | ORAL | 0 refills | Status: DC

## 2019-11-29 MED ORDER — METHOCARBAMOL 500 MG PO TABS: 500.0000 mg | ORAL_TABLET | Freq: Three times a day (TID) | ORAL | 0 refills | Status: DC | PRN

## 2019-11-29 NOTE — Progress Notes (Signed)
Established Patient Office Visit  Subjective:  Patient ID: Matthew Barnes, male    DOB: May 24, 1955  Age: 64 y.o. MRN: [id number]  CC:  Chief Complaint  Patient presents with   Back Pain    HPI Matthew Barnes presents for approximately 1 month history of left flank pain.  Denied any injury.  He went to orthopedics with [facility] and reportedly had some plain films which were unremarkable.  He states no therapies were instituted.  He relates relatively constant pain.  Sometimes dull and sometimes sharp.  About 5 out of 10 intensity.  Worse with movement.  Denies any dysuria.  No cough, chest pain, dyspnea, fever, chills, skin rashes, or any appetite or weight changes.  No history of kidney stones.  Pain does seem to be worse with bending or twisting in certain directions and also with ambulation.  He tried some ibuprofen with mild relief.  Past Medical History:  Diagnosis Date   Bipolar II disorder - Managed by Marye Round [phone]) 05/03/2013   Diabetes mellitus    Hyperlipidemia    Hypertension    Unspecified hypothyroidism 05/03/2013    Past Surgical History:  Procedure Laterality Date   KNEE SURGERY     scope on left knee    Family History  Problem Relation Age of Onset   Diabetes Mother    Hypertension Mother    Heart disease Mother    Heart disease Father    Heart attack Father 43   Diabetes Father    Hypertension Father    Depression Father    Diabetes Sister    Depression Paternal Grandmother    Depression Other     Social History   Socioeconomic History   Marital status: Single    Spouse name: Not on file   Number of children: Not on file   Years of education: Not on file   Highest education level: Not on file  Occupational History   Not on file  Tobacco Use   Smoking status: Never Smoker   Smokeless tobacco: Never Used  Substance and Sexual Activity   Alcohol use: No   Drug use: No   Sexual activity:  Never  Other Topics Concern   Not on file  Social History Narrative   Work or School: on disability for knee arthritis      Home Situation: lives alone      Spiritual Beliefs:Christian      Lifestyle:no regular exercising; diet not great            Social Determinants of Health   Financial Resource Strain:    Difficulty of Paying Living Expenses: Not on file  Food Insecurity:    Worried About Charity fundraiser in the Last Year: Not on file   [employer] of Food in the Last Year: Not on file  Transportation Needs:    Lack of Transportation (Medical): Not on file   Lack of Transportation (Non-Medical): Not on file  Physical Activity:    Days of Exercise per Week: Not on file   Minutes of Exercise per Session: Not on file  Stress:    Feeling of Stress : Not on file  Social Connections:    Frequency of Communication with Friends and Family: Not on file   Frequency of Social Gatherings with Friends and Family: Not on file   Attends Religious Services: Not on file   Active Member of Clubs or Organizations: Not on file   Attends Club  or Organization Meetings: Not on file   Marital Status: Not on file  Intimate Partner Violence:    Fear of Current or Ex-Partner: Not on file   Emotionally Abused: Not on file   Physically Abused: Not on file   Sexually Abused: Not on file    Outpatient Medications Prior to Visit  Medication Sig Dispense Refill   amLODipine (NORVASC) 10 MG tablet TAKE 1 TABLET BY MOUTH EVERY DAY 90 tablet 2   Blood Pressure Monitoring (BLOOD PRESSURE CUFF) MISC Use as directed 1 each 0   diclofenac (VOLTAREN) 75 MG EC tablet Take 75 mg by mouth as needed.     gabapentin (NEURONTIN) 300 MG capsule TAKE 1 CAPSULE BY MOUTH THREE TIMES A DAY 270 capsule 0   hydrochlorothiazide (HYDRODIURIL) 25 MG tablet Take 0.5 tablets (12.5 mg total) by mouth daily. 45 tablet 1   Insulin Syringe-Needle U-100 (INSULIN SYRINGE 1CC/31GX5/16") 31G X 5/16" 1 ML  MISC Use 3 needles per day 100 each 3   lithium carbonate 300 MG capsule Take 3 capsules (900 mg total) by mouth daily.     losartan (COZAAR) 100 MG tablet TAKE 1 TABLET BY MOUTH EVERY DAY 90 tablet 2   lovastatin (MEVACOR) 20 MG tablet Take 1 tablet (20 mg total) by mouth daily. 90 tablet 2   metFORMIN (GLUCOPHAGE) 1000 MG tablet TAKE 1 TABLET BY MOUTH 2 TIMES DAILY WITH A MEAL. 180 tablet 0   NOVOLIN 70/30 (70-30) 100 UNIT/ML injection INJECT 40 UNITS UNDER THE SKIN BEFORE BREAKFAST AND 10 UNITS BEFORE SUPPER. 20 mL 0   omeprazole (PRILOSEC) 20 MG capsule TAKE 1 CAPSULE BY MOUTH EVERY DAY 90 capsule 1   ONETOUCH VERIO test strip USE AS INSTRUCTED TO CHECK BLOOD SUGAR THREE TIMES A DAY. 300 each 1   QUEtiapine (SEROQUEL) 100 MG tablet Take 400 mg by mouth at bedtime.      sildenafil (VIAGRA) 50 MG tablet Take 1 tablet (50 mg total) by mouth daily as needed for erectile dysfunction. Do not take more then once in 24 hours. 10 tablet 3   No facility-administered medications prior to visit.    Allergies  Allergen Reactions   Influenza Vaccines Swelling    Reports of swelling of the arm and then sick for 10 days    ROS Review of Systems  Constitutional: Negative for chills and fever.  Respiratory: Negative for cough and shortness of breath.   Cardiovascular: Negative for chest pain.  Gastrointestinal: Negative for abdominal pain.  Genitourinary: Positive for flank pain. Negative for difficulty urinating, dysuria and hematuria.      Objective:    Physical Exam Vitals reviewed.  Constitutional:      Appearance: Normal appearance.  Cardiovascular:     Rate and Rhythm: Normal rate and regular rhythm.  Pulmonary:     Effort: Pulmonary effort is normal.     Breath sounds: Normal breath sounds.  Musculoskeletal:     Comments: No spinal tenderness.  He does have some soft tissue mild tenderness left flank region but no visible swelling or erythema.  No ecchymosis.  Skin:     Findings: No rash.  Neurological:     Mental Status: He is alert.     BP 130/70    Pulse 80    Temp 98.3 F (36.8 C) (Oral)    Ht 6\' 1"  (1.854 m)    Wt 285 lb (129.3 kg)    SpO2 98%    BMI 37.60 kg/m  Wt Readings  from Last 3 Encounters:  11/29/19 285 lb (129.3 kg)  10/19/19 286 lb 3.2 oz (129.8 kg)  08/12/19 290 lb 8 oz (131.8 kg)     Health Maintenance Due  Topic Date Due   Fecal DNA (Cologuard)  11/07/2019    There are no preventive care reminders to display for this patient.  Lab Results  Component Value Date   TSH 1.94 06/14/2019   Lab Results  Component Value Date   WBC 8.4 06/14/2019   HGB 13.9 06/14/2019   HCT 42.2 06/14/2019   MCV 85.2 06/14/2019   PLT 167.0 06/14/2019   Lab Results  Component Value Date   NA 139 10/18/2019   K 4.2 10/18/2019   CO2 30 10/18/2019   GLUCOSE 223 (H) 10/18/2019   BUN 13 10/18/2019   CREATININE 1.37 10/18/2019   BILITOT 0.6 06/14/2019   ALKPHOS 42 06/14/2019   AST 27 06/14/2019   ALT 30 06/14/2019   PROT 6.6 06/14/2019   ALBUMIN 4.2 06/14/2019   CALCIUM 9.3 10/18/2019   GFR 52.38 (L) 10/18/2019   Lab Results  Component Value Date   CHOL 128 07/11/2019   Lab Results  Component Value Date   HDL 32.60 (L) 07/11/2019   Lab Results  Component Value Date   LDLCALC 68 07/11/2019   Lab Results  Component Value Date   TRIG 137.0 07/11/2019   Lab Results  Component Value Date   CHOLHDL 4 07/11/2019   Lab Results  Component Value Date   HGBA1C 7.0 (H) 10/18/2019      Assessment & Plan:   Left low back/flank pain.  This sounds to be more likely musculoskeletal given the fact this is worse with movement and improved some with ibuprofen.  -Check urinalysis. -Consider trial of Robaxin 500 mg nightly.  He is aware this may cause some sedation.  Consider short-term trial of meloxicam 15 mg once daily.  He will also try some muscle massage and sports creams such as Biofreeze.  He did try some muscle massage with  temporary relief  Meds ordered this encounter  Medications   methocarbamol (ROBAXIN) 500 MG tablet    Sig: Take 1 tablet (500 mg total) by mouth every 8 (eight) hours as needed for muscle spasms.    Dispense:  20 tablet    Refill:  0   meloxicam (MOBIC) 15 MG tablet    Sig: Take 1 tablet (15 mg total) by mouth daily.    Dispense:  20 tablet    Refill:  0    Follow-up: No follow-ups on file.    Carolann Littler, MD

## 2019-11-29 NOTE — Patient Instructions (Signed)
Try some heat and muscle massage  Consider topical use of over the counter sports cream such as Biofreeze  Use the muscle relaxer at night- may cause some drowsiness  Let me know in 2-3 weeks if not better

## 2019-12-07 ENCOUNTER — Other Ambulatory Visit: Payer: Self-pay | Admitting: Endocrinology

## 2019-12-13 ENCOUNTER — Other Ambulatory Visit (INDEPENDENT_AMBULATORY_CARE_PROVIDER_SITE_OTHER): Payer: Medicare HMO

## 2019-12-13 ENCOUNTER — Other Ambulatory Visit: Payer: Self-pay

## 2019-12-13 DIAGNOSIS — E782 Mixed hyperlipidemia: Secondary | ICD-10-CM | POA: Diagnosis not present

## 2019-12-13 DIAGNOSIS — Z8639 Personal history of other endocrine, nutritional and metabolic disease: Secondary | ICD-10-CM | POA: Diagnosis not present

## 2019-12-13 DIAGNOSIS — M62830 Muscle spasm of back: Secondary | ICD-10-CM | POA: Diagnosis not present

## 2019-12-13 DIAGNOSIS — E1140 Type 2 diabetes mellitus with diabetic neuropathy, unspecified: Secondary | ICD-10-CM

## 2019-12-13 DIAGNOSIS — M9902 Segmental and somatic dysfunction of thoracic region: Secondary | ICD-10-CM | POA: Diagnosis not present

## 2019-12-13 DIAGNOSIS — M9901 Segmental and somatic dysfunction of cervical region: Secondary | ICD-10-CM | POA: Diagnosis not present

## 2019-12-13 DIAGNOSIS — M546 Pain in thoracic spine: Secondary | ICD-10-CM | POA: Diagnosis not present

## 2019-12-13 DIAGNOSIS — Z794 Long term (current) use of insulin: Secondary | ICD-10-CM | POA: Diagnosis not present

## 2019-12-13 LAB — BASIC METABOLIC PANEL
BUN: 17 mg/dL (ref 6–23)
CO2: 30 mEq/L (ref 19–32)
Calcium: 9.6 mg/dL (ref 8.4–10.5)
Chloride: 105 mEq/L (ref 96–112)
Creatinine, Ser: 1.47 mg/dL (ref 0.40–1.50)
GFR: 48.26 mL/min — ABNORMAL LOW (ref >60.00)
Glucose, Bld: 125 mg/dL — ABNORMAL HIGH (ref 70–99)
Potassium: 3.8 mEq/L (ref 3.5–5.1)
Sodium: 141 mEq/L (ref 135–145)

## 2019-12-13 LAB — TSH: TSH: 1.34 u[IU]/mL (ref 0.35–4.50)

## 2019-12-14 LAB — FRUCTOSAMINE: Fructosamine: 275 umol/L (ref 0–285)

## 2019-12-15 DIAGNOSIS — M62830 Muscle spasm of back: Secondary | ICD-10-CM | POA: Diagnosis not present

## 2019-12-15 DIAGNOSIS — F3132 Bipolar disorder, current episode depressed, moderate: Secondary | ICD-10-CM | POA: Diagnosis not present

## 2019-12-15 DIAGNOSIS — M9901 Segmental and somatic dysfunction of cervical region: Secondary | ICD-10-CM | POA: Diagnosis not present

## 2019-12-15 DIAGNOSIS — M9902 Segmental and somatic dysfunction of thoracic region: Secondary | ICD-10-CM | POA: Diagnosis not present

## 2019-12-15 DIAGNOSIS — M546 Pain in thoracic spine: Secondary | ICD-10-CM | POA: Diagnosis not present

## 2019-12-19 ENCOUNTER — Other Ambulatory Visit: Payer: Medicare HMO

## 2019-12-19 DIAGNOSIS — M9901 Segmental and somatic dysfunction of cervical region: Secondary | ICD-10-CM | POA: Diagnosis not present

## 2019-12-19 DIAGNOSIS — M546 Pain in thoracic spine: Secondary | ICD-10-CM | POA: Diagnosis not present

## 2019-12-19 DIAGNOSIS — M62830 Muscle spasm of back: Secondary | ICD-10-CM | POA: Diagnosis not present

## 2019-12-19 DIAGNOSIS — M9902 Segmental and somatic dysfunction of thoracic region: Secondary | ICD-10-CM | POA: Diagnosis not present

## 2019-12-20 NOTE — Progress Notes (Deleted)
NEUROLOGY FOLLOW UP OFFICE NOTE  Matthew Barnes [id number]  HISTORY OF PRESENT ILLNESS: Matthew Barnes is a 64 year old right-handed male with HTN, diabetes, and Bipolar II disorder who follows up for possible Parkinson's disease and unsteady gait.  UPDATE: ***  HISTORY: He has longstanding history of peripheral neuropathy due to diabetes, which has been controlled.  Hgb A1c in December was 6.1.  In February, he started having problems with balance.  He is much more cautious when he is on his feet.  Usually, he notes that his right leg gives out.  When walking, his right leg seems turned outward.  Earlier this year, he had right hip pain and was seen by ortho where he was diagnosed with right trochanteric bursitis.  X-rays of right hip reportedly okay.  He had X-ray of lumbosacral spine as well.  He received injections and pain has resolved but still with balance problems.  No back or radicular pain down legs.  He has prior history of vertigo but there is no associated dizziness when he is on his feet.  For a couple of years, he has had tremor in his hands, which have gradually progressed.  It is most pronounced when he is holding an object.  Denies double vision, slurred speech, or trouble swallowing.    MRI of brain with and without contrast on 07/13/2019 personally reviewed showed minimal chronic small vessel ischemic changes in the cerebral white matter without involvement of brainstem and cerebellum and no acute intracranial abnormality  PAST MEDICAL HISTORY: Past Medical History:  Diagnosis Date   Bipolar II disorder - Managed by Marye Round [phone]) 05/03/2013   Diabetes mellitus    Hyperlipidemia    Hypertension    Unspecified hypothyroidism 05/03/2013    MEDICATIONS: Current Outpatient Medications on File Prior to Visit  Medication Sig Dispense Refill   amLODipine (NORVASC) 10 MG tablet TAKE 1 TABLET BY MOUTH EVERY DAY 90 tablet 2   Blood Pressure  Monitoring (BLOOD PRESSURE CUFF) MISC Use as directed 1 each 0   diclofenac (VOLTAREN) 75 MG EC tablet Take 75 mg by mouth as needed.     gabapentin (NEURONTIN) 300 MG capsule TAKE 1 CAPSULE BY MOUTH THREE TIMES A DAY 270 capsule 0   hydrochlorothiazide (HYDRODIURIL) 25 MG tablet Take 0.5 tablets (12.5 mg total) by mouth daily. 45 tablet 1   Insulin Syringe-Needle U-100 (INSULIN SYRINGE 1CC/31GX5/16") 31G X 5/16" 1 ML MISC Use 3 needles per day 100 each 3   lithium carbonate 300 MG capsule Take 3 capsules (900 mg total) by mouth daily.     losartan (COZAAR) 100 MG tablet TAKE 1 TABLET BY MOUTH EVERY DAY 90 tablet 2   lovastatin (MEVACOR) 20 MG tablet Take 1 tablet (20 mg total) by mouth daily. 90 tablet 2   meloxicam (MOBIC) 15 MG tablet Take 1 tablet (15 mg total) by mouth daily. 20 tablet 0   metFORMIN (GLUCOPHAGE) 1000 MG tablet TAKE 1 TABLET BY MOUTH 2 TIMES DAILY WITH A MEAL. 180 tablet 0   methocarbamol (ROBAXIN) 500 MG tablet Take 1 tablet (500 mg total) by mouth every 8 (eight) hours as needed for muscle spasms. 20 tablet 0   NOVOLIN 70/30 (70-30) 100 UNIT/ML injection INJECT 40 UNITS UNDER THE SKIN BEFORE BREAKFAST AND 10 UNITS BEFORE SUPPER. 20 mL 0   omeprazole (PRILOSEC) 20 MG capsule TAKE 1 CAPSULE BY MOUTH EVERY DAY 90 capsule 1   ONETOUCH VERIO test strip USE AS INSTRUCTED TO  CHECK BLOOD SUGAR THREE TIMES A DAY. 300 each 1   QUEtiapine (SEROQUEL) 100 MG tablet Take 400 mg by mouth at bedtime.      sildenafil (VIAGRA) 50 MG tablet Take 1 tablet (50 mg total) by mouth daily as needed for erectile dysfunction. Do not take more then once in 24 hours. 10 tablet 3   No current facility-administered medications on file prior to visit.    ALLERGIES: Allergies  Allergen Reactions   Influenza Vaccines Swelling    Reports of swelling of the arm and then sick for 10 days    FAMILY HISTORY: Family History  Problem Relation Age of Onset   Diabetes Mother     Hypertension Mother    Heart disease Mother    Heart disease Father    Heart attack Father 9   Diabetes Father    Hypertension Father    Depression Father    Diabetes Sister    Depression Paternal Grandmother    Depression Other    ***.  SOCIAL HISTORY: Social History   Socioeconomic History   Marital status: Single    Spouse name: Not on file   Number of children: Not on file   Years of education: Not on file   Highest education level: Not on file  Occupational History   Not on file  Tobacco Use   Smoking status: Never Smoker   Smokeless tobacco: Never Used  Substance and Sexual Activity   Alcohol use: No   Drug use: No   Sexual activity: Never  Other Topics Concern   Not on file  Social History Narrative   Work or School: on disability for knee arthritis      Home Situation: lives alone      Spiritual Beliefs:Christian      Lifestyle:no regular exercising; diet not great            Social Determinants of Health   Financial Resource Strain:    Difficulty of Paying Living Expenses: Not on file  Food Insecurity:    Worried About Charity fundraiser in the Last Year: Not on file   [employer] of Food in the Last Year: Not on file  Transportation Needs:    Lack of Transportation (Medical): Not on file   Lack of Transportation (Non-Medical): Not on file  Physical Activity:    Days of Exercise per Week: Not on file   Minutes of Exercise per Session: Not on file  Stress:    Feeling of Stress : Not on file  Social Connections:    Frequency of Communication with Friends and Family: Not on file   Frequency of Social Gatherings with Friends and Family: Not on file   Attends Religious Services: Not on file   Active Member of Clubs or Organizations: Not on file   Attends Archivist Meetings: Not on file   Marital Status: Not on file  Intimate Partner Violence:    Fear of Current or Ex-Partner: Not on file    Emotionally Abused: Not on file   Physically Abused: Not on file   Sexually Abused: Not on file    PHYSICAL EXAM: *** General: No acute distress.  Patient appears ***-groomed.   Head:  Normocephalic/atraumatic Eyes:  Fundi examined but not visualized Neck: supple, no paraspinal tenderness, full range of motion Heart:  Regular rate and rhythm Lungs:  Clear to auscultation bilaterally Back: No paraspinal tenderness Neurological Exam: alert and oriented to person, place, and time. Attention span  and concentration intact, recent and remote memory intact, fund of knowledge intact.  Speech fluent and not dysarthric, language intact.  CN II-XII intact. Bulk and tone normal, muscle strength 5/5 throughout.  Resting and action tremor in both hands.  No bradykinesia.  Sensation to light touch intact.  Deep tendon reflexes 1+ upper extremities, absent lower extremities.  Finger to nose and heel to shin testing intact.  Mildly wide-based gait.  External rotation of right leg.  Able to turn.  Some difficulty tandem walking.  No shuffling gait.  Romberg negative  IMPRESSION: 1.  Mixed resting and action tremor.  It does have a Parkinsonian quality but he does not meet criteria for Parkinson's disease.  He does not exhibit bradykinesia or rigidity.   2.  Unsteady gait/falls.  His right hip appears externally rotated.  This may be due to decreased mobility of the lateral rotator muscle group when he previously had hip pain.  I suspect physical therapy may help with this and his gait.  Gait is also complicated by underlying neuropathy.  He does not exhibit any shuffling gait to suspect Parkinson's.  PLAN: ***  Metta Clines, DO  CC: ***

## 2019-12-21 ENCOUNTER — Other Ambulatory Visit: Payer: Self-pay

## 2019-12-21 ENCOUNTER — Encounter: Payer: Self-pay | Admitting: Endocrinology

## 2019-12-21 ENCOUNTER — Ambulatory Visit (INDEPENDENT_AMBULATORY_CARE_PROVIDER_SITE_OTHER): Payer: Medicare HMO | Admitting: Endocrinology

## 2019-12-21 VITALS — BP 142/82 | HR 90 | Ht 73.0 in | Wt 287.0 lb

## 2019-12-21 DIAGNOSIS — M62830 Muscle spasm of back: Secondary | ICD-10-CM | POA: Diagnosis not present

## 2019-12-21 DIAGNOSIS — M546 Pain in thoracic spine: Secondary | ICD-10-CM | POA: Diagnosis not present

## 2019-12-21 DIAGNOSIS — Z794 Long term (current) use of insulin: Secondary | ICD-10-CM | POA: Diagnosis not present

## 2019-12-21 DIAGNOSIS — M9901 Segmental and somatic dysfunction of cervical region: Secondary | ICD-10-CM | POA: Diagnosis not present

## 2019-12-21 DIAGNOSIS — M9902 Segmental and somatic dysfunction of thoracic region: Secondary | ICD-10-CM | POA: Diagnosis not present

## 2019-12-21 DIAGNOSIS — E1165 Type 2 diabetes mellitus with hyperglycemia: Secondary | ICD-10-CM | POA: Diagnosis not present

## 2019-12-21 NOTE — Progress Notes (Signed)
Patient ID: Matthew Barnes, male   DOB: 1955-07-10, 64 y.o.   MRN: [id number]    Reason for Appointment: Followup for Type 2 Diabetes    History of Present Illness:          Diagnosis: Type 2 diabetes mellitus, date of diagnosis:   2011       Past history:  He was started on metformin at diagnosis and apparently did have fairly good control initially However about a year later because of poor control he was given insulin in addition. He had been taking Lantus insulin until about 2/15, up to 80 units a day However he does not think his blood sugars  Were controlled with this Because of higher A1c of 13.1% he was referred here for diabetes management in 6/15; was started on glucose monitoring and insulin was changed from low dose Levemir to Humalog mix 70 units a day He was also started on Victoza  to help with blood sugar control and facilitate weight loss on his initial consultation  Recent history:   INSULIN regimen is described as: Novolin mix 70/30, 40 units at breakfast, 20 units in p.m. as needed  Oral hypoglycemic drugs the patient is taking are: Metformin 1 g twice a day  A1c is last 7% compared to 5.8 Fructosamine is 275   Current blood sugar patterns, management of diabetes and problems:  His blood sugars have improved since his last visit  He says that he did not take his evening insulin as directed on the last visit as he started to work on his diet  Previously he would have high sugars in the mornings mostly when he would be eating during the night because of depression  However he has not checked his sugars in the evenings after dinner and only a few readings before dinner until about 10 days ago  Before dinner readings were improving when he last checked  He has occasionally used his recumbent bike but not consistent with exercise  He says he is forgetting to check his blood sugar when he comes back from home  Only checking fasting reading lately  He will  occasionally take 20 units of insulin in the evening if he starts having frequent urination rather than with blood sugar levels His weight is about the same  Side effects from medications have been: None  Glucose monitoring:  done about 1 times a day or less       Glucometer:  Verio   Blood Glucose readings from download   PRE-MEAL Fasting Lunch Dinner Bedtime Overall  Glucose range:  103-140  119-183  134-221    Mean/median:  120   175   143   Previous readings:  PRE-MEAL Fasting Lunch Dinner Bedtime Overall  Glucose range: 160-220   ?   Mean/median:     ?   POST-MEAL PC Breakfast PC Lunch PC Dinner  Glucose range:     Mean/median:        Glycemic control:   Lab Results  Component Value Date   HGBA1C 7.0 (H) 10/18/2019   HGBA1C 5.8 07/08/2019   HGBA1C 6.1 03/28/2019   Lab Results  Component Value Date   MICROALBUR 1.1 07/14/2019   LDLCALC 68 07/11/2019   CREATININE 1.47 12/13/2019    Self-care: The diet that the patient has been following is: None, unable to control portions and carbs  when depressed  Meals: 3 meals per day (dinner 6 pm-10 pm)  Dietician visit: Most recent: never      CDE visit: 08/2013             Weight history: baseline 295  Wt Readings from Last 3 Encounters:  12/21/19 287 lb (130.2 kg)  11/29/19 285 lb (129.3 kg)  10/19/19 286 lb 3.2 oz (129.8 kg)   No visits with results within 1 Week(s) from this visit.  Latest known visit with results is:  Lab on 12/13/2019  Component Date Value Ref Range Status  . TSH 12/13/2019 1.34  0.35 - 4.50 uIU/mL Final  . Fructosamine 12/13/2019 275  0 - 285 umol/L Final   Comment: Published reference interval for apparently healthy subjects between age 67 and 78 is 61 - 285 umol/L and in a poorly controlled diabetic population is 228 - 563 umol/L with a mean of 396 umol/L.   Marland Kitchen Sodium 12/13/2019 141  135 - 145 mEq/L Final  . Potassium 12/13/2019 3.8  3.5 - 5.1 mEq/L Final  . Chloride 12/13/2019  105  96 - 112 mEq/L Final  . CO2 12/13/2019 30  19 - 32 mEq/L Final  . Glucose, Bld 12/13/2019 125* 70 - 99 mg/dL Final  . BUN 12/13/2019 17  6 - 23 mg/dL Final  . Creatinine, Ser 12/13/2019 1.47  0.40 - 1.50 mg/dL Final  . GFR 12/13/2019 48.26* >60.00 mL/min Final  . Calcium 12/13/2019 9.6  8.4 - 10.5 mg/dL Final      Allergies as of 12/21/2019      Reactions   Influenza Vaccines Swelling   Reports of swelling of the arm and then sick for 10 days      Medication List       Accurate as of December 21, 2019 10:29 AM. If you have any questions, ask your nurse or doctor.        amLODipine 10 MG tablet Commonly known as: NORVASC TAKE 1 TABLET BY MOUTH EVERY DAY   Blood Pressure Cuff Misc Use as directed   diclofenac 75 MG EC tablet Commonly known as: VOLTAREN Take 75 mg by mouth as needed.   gabapentin 300 MG capsule Commonly known as: NEURONTIN TAKE 1 CAPSULE BY MOUTH THREE TIMES A DAY   hydrochlorothiazide 25 MG tablet Commonly known as: HYDRODIURIL Take 0.5 tablets (12.5 mg total) by mouth daily.   INSULIN SYRINGE 1CC/31GX5/16" 31G X 5/16" 1 ML Misc Use 3 needles per day   lithium carbonate 300 MG capsule Take 3 capsules (900 mg total) by mouth daily.   losartan 100 MG tablet Commonly known as: COZAAR TAKE 1 TABLET BY MOUTH EVERY DAY   lovastatin 20 MG tablet Commonly known as: MEVACOR Take 1 tablet (20 mg total) by mouth daily.   meloxicam 15 MG tablet Commonly known as: MOBIC Take 1 tablet (15 mg total) by mouth daily.   metFORMIN 1000 MG tablet Commonly known as: GLUCOPHAGE TAKE 1 TABLET BY MOUTH 2 TIMES DAILY WITH A MEAL.   methocarbamol 500 MG tablet Commonly known as: Robaxin Take 1 tablet (500 mg total) by mouth every 8 (eight) hours as needed for muscle spasms.   NovoLIN 70/30 (70-30) 100 UNIT/ML injection Generic drug: insulin NPH-regular Human INJECT 40 UNITS UNDER THE SKIN BEFORE BREAKFAST AND 10 UNITS BEFORE SUPPER.   omeprazole 20  MG capsule Commonly known as: PRILOSEC TAKE 1 CAPSULE BY MOUTH EVERY DAY   OneTouch Verio test strip Generic drug: glucose blood USE AS INSTRUCTED TO CHECK BLOOD SUGAR THREE TIMES A DAY.   QUEtiapine  100 MG tablet Commonly known as: SEROQUEL Take 400 mg by mouth at bedtime.   sildenafil 50 MG tablet Commonly known as: Viagra Take 1 tablet (50 mg total) by mouth daily as needed for erectile dysfunction. Do not take more then once in 24 hours.       Allergies:  Allergies  Allergen Reactions  . Influenza Vaccines Swelling    Reports of swelling of the arm and then sick for 10 days    Past Medical History:  Diagnosis Date  . Bipolar II disorder - Managed by Marye Round [phone]) 05/03/2013  . Diabetes mellitus   . Hyperlipidemia   . Hypertension   . Unspecified hypothyroidism 05/03/2013    Past Surgical History:  Procedure Laterality Date  . KNEE SURGERY     scope on left knee    Family History  Problem Relation Age of Onset  . Diabetes Mother   . Hypertension Mother   . Heart disease Mother   . Heart disease Father   . Heart attack Father 44  . Diabetes Father   . Hypertension Father   . Depression Father   . Diabetes Sister   . Depression Paternal Grandmother   . Depression Other     Social History:  reports that he has never smoked. He has never used smokeless tobacco. He reports that he does not drink alcohol and does not use drugs.    Review of Systems   HYPERTENSION:  He is taking losartan 100 mg and Norvasc 10, HCTZ 12.5 prescribed by PCP   BP Readings from Last 3 Encounters:  12/21/19 (!) 142/82  11/29/19 130/70  10/19/19 (!) 152/84         LIPIDS: He has had marked increase in triglycerides previously, now improved.  Currently on 20 mg lovastatin, also followed by PCP        Lab Results  Component Value Date   CHOL 128 07/11/2019   HDL 32.60 (L) 07/11/2019   LDLCALC 68 07/11/2019   LDLDIRECT 73.0 09/01/2017   TRIG 137.0  07/11/2019   CHOLHDL 4 07/11/2019       Thyroid:  Previously had mild hypothyroidism for a few years TSH subsequently has been normal consistently without taking any thyroid supplements  Is still on lithium long-term  Lab Results  Component Value Date   TSH 1.34 12/13/2019        He has had some numbness in his feet, more on the right side, using gabapentin only as needed Sometimes will use elastic stockings with some relief  Findings on last exam:   Patchy decrease in sensation in the toes especially right and decreased on the plantar surface also   LABS:  No visits with results within 1 Week(s) from this visit.  Latest known visit with results is:  Lab on 12/13/2019  Component Date Value Ref Range Status  . TSH 12/13/2019 1.34  0.35 - 4.50 uIU/mL Final  . Fructosamine 12/13/2019 275  0 - 285 umol/L Final   Comment: Published reference interval for apparently healthy subjects between age 28 and 27 is 66 - 285 umol/L and in a poorly controlled diabetic population is 228 - 563 umol/L with a mean of 396 umol/L.   Marland Kitchen Sodium 12/13/2019 141  135 - 145 mEq/L Final  . Potassium 12/13/2019 3.8  3.5 - 5.1 mEq/L Final  . Chloride 12/13/2019 105  96 - 112 mEq/L Final  . CO2 12/13/2019 30  19 - 32 mEq/L Final  . Glucose, Bld  12/13/2019 125* 70 - 99 mg/dL Final  . BUN 12/13/2019 17  6 - 23 mg/dL Final  . Creatinine, Ser 12/13/2019 1.47  0.40 - 1.50 mg/dL Final  . GFR 12/13/2019 48.26* >60.00 mL/min Final  . Calcium 12/13/2019 9.6  8.4 - 10.5 mg/dL Final    Physical Examination:  BP (!) 142/82   Pulse 90   Ht 6\' 1"  (1.854 m)   Wt 287 lb (130.2 kg)   SpO2 99%   BMI 37.87 kg/m      ASSESSMENT/PLAN:   Diabetes type 2, on insulin  See history of present illness for detailed discussion of his current management, blood sugar patterns and problems identified  Fructosamine of 275 indicates somewhat better control  He is on premixed insulin once a day with Metformin  Blood  sugars have improved since his last visit despite no change in his insulin regimen as recommended He is trying to do a little exercise Also he thinks with his depression improving he is cutting back on his snacks at night This has helped his morning sugar However still forgets to check his readings before and after dinner  Discussed need to periodically check readings at bedtime at least Also discussed possibility of using mealtime insulin at dinnertime if blood sugars are rising over 180 about 2 hours after dinner  May consider adding Jardiance especially blood pressure trends higher and if he has difficulty with weight loss  Follow-up in 3 months  There are no Patient Instructions on file for this visit.    Elayne Snare 12/21/2019, 10:29 AM   Note: This office note was prepared with Dragon voice recognition system technology. Any transcriptional errors that result from this process are unintentional.

## 2019-12-22 ENCOUNTER — Ambulatory Visit: Payer: Medicare HMO | Admitting: Neurology

## 2019-12-23 DIAGNOSIS — M9901 Segmental and somatic dysfunction of cervical region: Secondary | ICD-10-CM | POA: Diagnosis not present

## 2019-12-23 DIAGNOSIS — M62830 Muscle spasm of back: Secondary | ICD-10-CM | POA: Diagnosis not present

## 2019-12-23 DIAGNOSIS — M9902 Segmental and somatic dysfunction of thoracic region: Secondary | ICD-10-CM | POA: Diagnosis not present

## 2019-12-23 DIAGNOSIS — M546 Pain in thoracic spine: Secondary | ICD-10-CM | POA: Diagnosis not present

## 2019-12-24 ENCOUNTER — Other Ambulatory Visit: Payer: Self-pay | Admitting: Endocrinology

## 2019-12-26 DIAGNOSIS — M62830 Muscle spasm of back: Secondary | ICD-10-CM | POA: Diagnosis not present

## 2019-12-26 DIAGNOSIS — M9902 Segmental and somatic dysfunction of thoracic region: Secondary | ICD-10-CM | POA: Diagnosis not present

## 2019-12-26 DIAGNOSIS — M546 Pain in thoracic spine: Secondary | ICD-10-CM | POA: Diagnosis not present

## 2019-12-26 DIAGNOSIS — M9901 Segmental and somatic dysfunction of cervical region: Secondary | ICD-10-CM | POA: Diagnosis not present

## 2019-12-27 DIAGNOSIS — M9901 Segmental and somatic dysfunction of cervical region: Secondary | ICD-10-CM | POA: Diagnosis not present

## 2019-12-27 DIAGNOSIS — M62830 Muscle spasm of back: Secondary | ICD-10-CM | POA: Diagnosis not present

## 2019-12-27 DIAGNOSIS — M9902 Segmental and somatic dysfunction of thoracic region: Secondary | ICD-10-CM | POA: Diagnosis not present

## 2019-12-27 DIAGNOSIS — M546 Pain in thoracic spine: Secondary | ICD-10-CM | POA: Diagnosis not present

## 2019-12-29 DIAGNOSIS — M9902 Segmental and somatic dysfunction of thoracic region: Secondary | ICD-10-CM | POA: Diagnosis not present

## 2019-12-29 DIAGNOSIS — M546 Pain in thoracic spine: Secondary | ICD-10-CM | POA: Diagnosis not present

## 2019-12-29 DIAGNOSIS — M62830 Muscle spasm of back: Secondary | ICD-10-CM | POA: Diagnosis not present

## 2019-12-29 DIAGNOSIS — M9901 Segmental and somatic dysfunction of cervical region: Secondary | ICD-10-CM | POA: Diagnosis not present

## 2020-01-02 DIAGNOSIS — M546 Pain in thoracic spine: Secondary | ICD-10-CM | POA: Diagnosis not present

## 2020-01-02 DIAGNOSIS — M9902 Segmental and somatic dysfunction of thoracic region: Secondary | ICD-10-CM | POA: Diagnosis not present

## 2020-01-02 DIAGNOSIS — M62830 Muscle spasm of back: Secondary | ICD-10-CM | POA: Diagnosis not present

## 2020-01-02 DIAGNOSIS — M9901 Segmental and somatic dysfunction of cervical region: Secondary | ICD-10-CM | POA: Diagnosis not present

## 2020-01-03 DIAGNOSIS — M9901 Segmental and somatic dysfunction of cervical region: Secondary | ICD-10-CM | POA: Diagnosis not present

## 2020-01-03 DIAGNOSIS — M9902 Segmental and somatic dysfunction of thoracic region: Secondary | ICD-10-CM | POA: Diagnosis not present

## 2020-01-03 DIAGNOSIS — M546 Pain in thoracic spine: Secondary | ICD-10-CM | POA: Diagnosis not present

## 2020-01-03 DIAGNOSIS — M62830 Muscle spasm of back: Secondary | ICD-10-CM | POA: Diagnosis not present

## 2020-01-05 DIAGNOSIS — M62830 Muscle spasm of back: Secondary | ICD-10-CM | POA: Diagnosis not present

## 2020-01-05 DIAGNOSIS — M546 Pain in thoracic spine: Secondary | ICD-10-CM | POA: Diagnosis not present

## 2020-01-05 DIAGNOSIS — M9902 Segmental and somatic dysfunction of thoracic region: Secondary | ICD-10-CM | POA: Diagnosis not present

## 2020-01-05 DIAGNOSIS — M9901 Segmental and somatic dysfunction of cervical region: Secondary | ICD-10-CM | POA: Diagnosis not present

## 2020-01-10 DIAGNOSIS — M9902 Segmental and somatic dysfunction of thoracic region: Secondary | ICD-10-CM | POA: Diagnosis not present

## 2020-01-10 DIAGNOSIS — M546 Pain in thoracic spine: Secondary | ICD-10-CM | POA: Diagnosis not present

## 2020-01-10 DIAGNOSIS — M62830 Muscle spasm of back: Secondary | ICD-10-CM | POA: Diagnosis not present

## 2020-01-10 DIAGNOSIS — M9901 Segmental and somatic dysfunction of cervical region: Secondary | ICD-10-CM | POA: Diagnosis not present

## 2020-01-20 DIAGNOSIS — E119 Type 2 diabetes mellitus without complications: Secondary | ICD-10-CM | POA: Diagnosis not present

## 2020-01-21 DIAGNOSIS — Z0100 Encounter for examination of eyes and vision without abnormal findings: Secondary | ICD-10-CM | POA: Diagnosis not present

## 2020-01-25 ENCOUNTER — Other Ambulatory Visit: Payer: Self-pay | Admitting: Endocrinology

## 2020-01-27 ENCOUNTER — Telehealth: Payer: Self-pay | Admitting: Family Medicine

## 2020-01-27 NOTE — Telephone Encounter (Signed)
Hilda Blades from [place] was returning JoAnne's call  Mechele Claude called in reference to a plan of care  Hilda Blades didn't understand the complete message  The Medicare # [id number] O to verify if this is the same patient  Please contact Dorena Bodo at [phone]

## 2020-01-27 NOTE — Telephone Encounter (Signed)
Spoke with Hilda Blades and informed her from what I recall Dr Ethlyn Gallery received a form and the pts date of birth did not match the current chart.  Fax number given to Hilda Blades to re-fax for Dr Ethlyn Gallery to review.

## 2020-02-14 DIAGNOSIS — F3132 Bipolar disorder, current episode depressed, moderate: Secondary | ICD-10-CM | POA: Diagnosis not present

## 2020-02-15 ENCOUNTER — Ambulatory Visit (INDEPENDENT_AMBULATORY_CARE_PROVIDER_SITE_OTHER): Payer: Medicare HMO

## 2020-02-15 ENCOUNTER — Other Ambulatory Visit: Payer: Self-pay

## 2020-02-15 DIAGNOSIS — Z0000 Encounter for general adult medical examination without abnormal findings: Secondary | ICD-10-CM | POA: Diagnosis not present

## 2020-02-15 DIAGNOSIS — Z1211 Encounter for screening for malignant neoplasm of colon: Secondary | ICD-10-CM

## 2020-02-15 NOTE — Progress Notes (Signed)
Virtual Visit via Telephone Note  I connected with  [name] on 02/15/20 at 11:00 AM EST by telephone and verified that I am speaking with the correct person using two identifiers.  Medicare Annual Wellness visit completed telephonically due to Covid-19 pandemic.   Persons participating in this call: This Health Coach and this patient.   Location: Patient: Home Provider: Office   I discussed the limitations, risks, security and privacy concerns of performing an evaluation and management service by telephone and the availability of in person appointments. The patient expressed understanding and agreed to proceed.  Unable to perform video visit due to video visit attempted and failed and/or patient does not have video capability.   Some vital signs may be absent or patient reported.   Willette Brace, LPN    Subjective:   Matthew Barnes is a 64 y.o. male who presents for Medicare Annual/Subsequent preventive examination.  Review of Systems     Cardiac Risk Factors include: advanced age (>58men, >35 women);diabetes mellitus;dyslipidemia;hypertension;male gender;obesity (BMI >30kg/m2)     Objective:    There were no vitals filed for this visit. There is no height or weight on file to calculate BMI.  Advanced Directives 02/15/2020 08/12/2019 01/19/2018 09/01/2017 08/28/2016  Does Patient Have a Medical Advance Directive? No No No No No  Would patient like information on creating a medical advance directive? No - Patient declined - No - Patient declined - -    Current Medications (verified) Outpatient Encounter Medications as of 02/15/2020  Medication Sig  . amLODipine (NORVASC) 10 MG tablet TAKE 1 TABLET BY MOUTH EVERY DAY  . Blood Pressure Monitoring (BLOOD PRESSURE CUFF) MISC Use as directed  . gabapentin (NEURONTIN) 300 MG capsule TAKE 1 CAPSULE BY MOUTH THREE TIMES A DAY  . hydrochlorothiazide (HYDRODIURIL) 25 MG tablet Take 0.5 tablets (12.5 mg total) by mouth daily.  .  Insulin Syringe-Needle U-100 (INSULIN SYRINGE 1CC/31GX5/16") 31G X 5/16" 1 ML MISC Use 3 needles per day  . lithium carbonate 300 MG capsule Take 3 capsules (900 mg total) by mouth daily.  Marland Kitchen losartan (COZAAR) 100 MG tablet TAKE 1 TABLET BY MOUTH EVERY DAY  . lovastatin (MEVACOR) 20 MG tablet Take 1 tablet (20 mg total) by mouth daily.  . metFORMIN (GLUCOPHAGE) 1000 MG tablet TAKE 1 TABLET BY MOUTH 2 TIMES DAILY WITH A MEAL.  Marland Kitchen NOVOLIN 70/30 (70-30) 100 UNIT/ML injection INJECT 40 UNITS UNDER THE SKIN BEFORE BREAKFAST AND 10 UNITS BEFORE SUPPER.  Marland Kitchen omeprazole (PRILOSEC) 20 MG capsule TAKE 1 CAPSULE BY MOUTH EVERY DAY  . ONETOUCH VERIO test strip USE AS INSTRUCTED TO CHECK BLOOD SUGAR THREE TIMES A DAY.  . QUEtiapine (SEROQUEL) 100 MG tablet Take 400 mg by mouth at bedtime.   . sildenafil (VIAGRA) 50 MG tablet Take 1 tablet (50 mg total) by mouth daily as needed for erectile dysfunction. Do not take more then once in 24 hours.  . [DISCONTINUED] diclofenac (VOLTAREN) 75 MG EC tablet Take 75 mg by mouth as needed. (Patient not taking: Reported on 02/15/2020)  . [DISCONTINUED] meloxicam (MOBIC) 15 MG tablet Take 1 tablet (15 mg total) by mouth daily. (Patient not taking: Reported on 02/15/2020)  . [DISCONTINUED] methocarbamol (ROBAXIN) 500 MG tablet Take 1 tablet (500 mg total) by mouth every 8 (eight) hours as needed for muscle spasms. (Patient not taking: Reported on 02/15/2020)   No facility-administered encounter medications on file as of 02/15/2020.    Allergies (verified) Influenza vaccines   History:  Past Medical History:  Diagnosis Date  . Bipolar II disorder - Managed by Marye Round [id number]) 05/03/2013  . Diabetes mellitus   . Hyperlipidemia   . Hypertension   . Unspecified hypothyroidism 05/03/2013   Past Surgical History:  Procedure Laterality Date  . KNEE SURGERY     scope on left knee   Family History  Problem Relation Age of Onset  . Diabetes Mother   .  Hypertension Mother   . Heart disease Mother   . Heart disease Father   . Heart attack Father 77  . Diabetes Father   . Hypertension Father   . Depression Father   . Diabetes Sister   . Depression Paternal Grandmother   . Depression Other    Social History   Socioeconomic History  . Marital status: Single    Spouse name: Not on file  . Number of children: Not on file  . Years of education: Not on file  . Highest education level: Not on file  Occupational History  . Not on file  Tobacco Use  . Smoking status: Never Smoker  . Smokeless tobacco: Never Used  Substance and Sexual Activity  . Alcohol use: No  . Drug use: No  . Sexual activity: Never  Other Topics Concern  . Not on file  Social History Narrative   Work or School: on disability for knee arthritis      Home Situation: lives alone      Spiritual Beliefs:Christian      Lifestyle:no regular exercising; diet not great            Social Determinants of Health   Financial Resource Strain: Low Risk   . Difficulty of Paying Living Expenses: Not hard at all  Food Insecurity: No Food Insecurity  . Worried About Charity fundraiser in the Last Year: Never true  . Ran Out of Food in the Last Year: Never true  Transportation Needs: No Transportation Needs  . Lack of Transportation (Medical): No  . Lack of Transportation (Non-Medical): No  Physical Activity: Insufficiently Active  . Days of Exercise per Week: 5 days  . Minutes of Exercise per Session: 20 min  Stress: No Stress Concern Present  . Feeling of Stress : Not at all  Social Connections: Socially Isolated  . Frequency of Communication with Friends and Family: Three times a week  . Frequency of Social Gatherings with Friends and Family: Never  . Attends Religious Services: Never  . Active Member of Clubs or Organizations: No  . Attends Archivist Meetings: Never  . Marital Status: Never married    Tobacco Counseling Counseling given: Not  Answered   Clinical Intake:  Pre-visit preparation completed: Yes  Pain : No/denies pain     BMI - recorded: 37.87 Nutritional Status: BMI > 30  Obese Nutritional Risks: None Diabetes: Yes CBG done?: Yes (158) CBG resulted in Enter/ Edit results?: No Did pt. bring in CBG monitor from home?: No  How often do you need to have someone help you when you read instructions, pamphlets, or other written materials from your doctor or pharmacy?: 1 - Never  Diabetic?Nutrition Risk Assessment:  Has the patient had any N/V/D within the last 2 months?  No  Does the patient have any non-healing wounds?  No  Has the patient had any unintentional weight loss or weight gain?  No   Diabetes:  Is the patient diabetic?  Yes  If diabetic, was a CBG obtained today?  Yes  Did the patient bring in their glucometer from home?  No  How often do you monitor your CBG's? Daily.   Financial Strains and Diabetes Management:  Are you having any financial strains with the device, your supplies or your medication? No .  Does the patient want to be seen by Chronic Care Management for management of their diabetes?  No  Would the patient like to be referred to a Nutritionist or for Diabetic Management?  No   Diabetic Exams:  Diabetic Eye Exam: Completed 12/2019 Diabetic Foot Exam: Completed 07/14/19   Interpreter Needed?: No  Information entered by :: Charlott Rakes, LPN   Activities of Daily Living In your present state of health, do you have any difficulty performing the following activities: 02/15/2020  Hearing? N  Vision? N  Difficulty concentrating or making decisions? N  Walking or climbing stairs? N  Dressing or bathing? N  Doing errands, shopping? N  Preparing Food and eating ? N  Using the Toilet? N  In the past six months, have you accidently leaked urine? N  Do you have problems with loss of bowel control? N  Managing your Medications? N  Managing your Finances? N  Housekeeping  or managing your Housekeeping? N  Some recent data might be hidden    Patient Care Team: Caren Macadam, MD as PCP - General (Family Medicine) Adegoroye, Wynona Luna, MD (Specialist) Elayne Snare, MD as Consulting Physician (Endocrinology)  Indicate any recent Medical Services you may have received from other than [facility] providers in the past year (date may be approximate).     Assessment:   This is a routine wellness examination for Matthew Barnes.  Hearing/Vision screen  Hearing Screening   125Hz  250Hz  500Hz  1000Hz  2000Hz  3000Hz  4000Hz  6000Hz  8000Hz   Right ear:           Left ear:           Comments: Pt denies any hearing issues   Vision Screening Comments: Follow up with Dr Ronnald Ramp for annual eye care  Dietary issues and exercise activities discussed: Current Exercise Habits: The patient does not participate in regular exercise at present  Goals    . Exercise 150 minutes per week (moderate activity)     Try the elliptical x 5 days a week and the goal is 30 minutes a day x 5     . patient     Lose weight    . Patient Stated     Try to get more exercise  Motivation is to get PUMPED    . Weight (lb) < 250 lb (113.4 kg)     Check out  online nutrition programs as GumSearch.nl and http://vang.com/;   There is a lot of other apps you may enjoy; "lose it".  You can track you food with calorieking.com  Check your sodium levels and calories per day with these apps until you find a combination that works for you   Look for foods with "whole" wheat; bran; oatmeal etc Shot at the farmer's markets in season for fresher choices  Watch for "hydrogenated" on the label of oils which are trans-fats.  Watch for "high fructose corn syrup" in snacks, yogurt or ketchup  Meats have less marbling; bright colored fruits and vegetables;  Canned; dump out liquid and wash vegetables. Be mindful of what we are eating  Portion control is essential to a health weight! Sit down; take a break and  enjoy your meal; take smaller bites; put the fork  down between bites;  It takes 20 minutes to get full; so check in with your fullness cues and stop eating when you start to fill full             Depression Screen PHQ 2/9 Scores 02/15/2020 08/12/2019 07/11/2019 09/01/2017 08/28/2016 05/17/2014  PHQ - 2 Score 0 3 2 1  0 0  PHQ- 9 Score - 6 5 - - -    Fall Risk Fall Risk  02/15/2020 08/12/2019 07/11/2019 09/01/2017 08/28/2016  Falls in the past year? 0 1 1 Yes No  Number falls in past yr: 0 1 0 1 -  Comment - - - about 4 months ago;  -  Injury with Fall? 0 0 1 - -  Risk for fall due to : Impaired vision - - - -  Follow up Falls prevention discussed - - Education provided -    Home free of loose throw rugs in walkways, pet beds, electrical cords, etc? Yes  Adequate lighting in your home to reduce risk of falls? Yes   ASSISTIVE DEVICES UTILIZED TO PREVENT FALLS:  Life alert? No  Use of a cane, walker or w/c? No  Grab bars in the bathroom? No  Shower chair or bench in shower? No  Elevated toilet seat or a handicapped toilet? No   TIMED UP AND GO:  Was the test performed? No .     Cognitive Function: Pt declined at this time MMSE - Mini Mental State Exam 09/01/2017  Not completed: (No Data)        Immunizations Immunization History  Administered Date(s) Administered  . Influenza,inj,Quad PF,6+ Mos 01/17/2014  . Moderna SARS-COVID-2 Vaccination 08/30/2019, 10/04/2019  . Pneumococcal Polysaccharide-23 01/17/2014    TDAP status: Up to date Flu Vaccine status: Declined, Education has been provided regarding the importance of this vaccine but patient still declined. Advised may receive this vaccine at local pharmacy or Health Dept. Aware to provide a copy of the vaccination record if obtained from local pharmacy or Health Dept. Verbalized acceptance and understanding. Pneumococcal vaccine status: Up to date Covid-19 vaccine status: Completed vaccines  Qualifies for Shingles  Vaccine? Yes   Zostavax completed No   Shingrix Completed?: No.    Education has been provided regarding the importance of this vaccine. Patient has been advised to call insurance company to determine out of pocket expense if they have not yet received this vaccine. Advised may also receive vaccine at local pharmacy or Health Dept. Verbalized acceptance and understanding.  Screening Tests Health Maintenance  Topic Date Due  . Fecal DNA (Cologuard)  11/07/2019  . HIV Screening  07/10/2020 (Originally 03/10/1971)  . HEMOGLOBIN A1C  04/19/2020  . FOOT EXAM  07/13/2020  . OPHTHALMOLOGY EXAM  12/29/2020  . TETANUS/TDAP  05/17/2024  . PNEUMOCOCCAL POLYSACCHARIDE VACCINE AGE 44-64 HIGH RISK  Completed  . COVID-19 Vaccine  Completed  . Hepatitis C Screening  Completed  . INFLUENZA VACCINE  Discontinued    Health Maintenance  Health Maintenance Due  Topic Date Due  . Fecal DNA (Cologuard)  11/07/2019    Colorectal cancer screening: Referral to GI placed 02/15/20. Pt aware the office will call re: appt.   Hepatitis C Screening: Completed 08/28/16  Vision Screening: Recommended annual ophthalmology exams for early detection of glaucoma and other disorders of the eye. Is the patient up to date with their annual eye exam?  Yes  Who is the provider or what is the name of the office in which the patient attends  annual eye exams? Dr Ronnald Ramp  Dental Screening: Recommended annual dental exams for proper oral hygiene  Community Resource Referral / Chronic Care Management: CRR required this visit?  No   CCM required this visit?  No      Plan:     I have personally reviewed and noted the following in the patient's chart:   . Medical and social history . Use of alcohol, tobacco or illicit drugs  . Current medications and supplements . Functional ability and status . Nutritional status . Physical activity . Advanced directives . List of other physicians . Hospitalizations, surgeries,  and ER visits in previous 12 months . Vitals . Screenings to include cognitive, depression, and falls . Referrals and appointments  In addition, I have reviewed and discussed with patient certain preventive protocols, quality metrics, and best practice recommendations. A written personalized care plan for preventive services as well as general preventive health recommendations were provided to patient.     Willette Brace, LPN   [id number]   Nurse Notes: None

## 2020-02-15 NOTE — Patient Instructions (Addendum)
[name] , Thank you for taking time to come for your Medicare Wellness Visit. I appreciate your ongoing commitment to your health goals. Please review the following plan we discussed and let me know if I can assist you in the future.   Screening recommendations/referrals: Colonoscopy: Done 11/06/16 order placed 02/15/20 Recommended yearly ophthalmology/optometry visit for glaucoma screening and checkup Recommended yearly dental visit for hygiene and checkup  Vaccinations: Influenza vaccine: Discontinue related to allergy  Pneumococcal vaccine: 1st done on 01/17/14 Tdap vaccine: Up to date Shingles vaccine: Shingrix discussed. Please contact your pharmacy for coverage information.    Covid-19: Completed 08/30/19 & 10/04/19  Advanced directives: Advance directive discussed with you today. Even though you declined this today please call our office should you change your mind and we can give you the proper paperwork for you to fill out.  Conditions/risks identified: Lose weight   Next appointment: Follow up in one year for your annual wellness visit.   Preventive Care 29 Years and Older, Male Preventive care refers to lifestyle choices and visits with your health care provider that can promote health and wellness. What does preventive care include?  A yearly physical exam. This is also called an annual well check.  Dental exams once or twice a year.  Routine eye exams. Ask your health care provider how often you should have your eyes checked.  Personal lifestyle choices, including:  Daily care of your teeth and gums.  Regular physical activity.  Eating a healthy diet.  Avoiding tobacco and drug use.  Limiting alcohol use.  Practicing safe sex.  Taking low doses of aspirin every day.  Taking vitamin and mineral supplements as recommended by your health care provider. What happens during an annual well check? The services and screenings done by your health care provider during  your annual well check will depend on your age, overall health, lifestyle risk factors, and family history of disease. Counseling  Your health care provider may ask you questions about your:  Alcohol use.  Tobacco use.  Drug use.  Emotional well-being.  Home and relationship well-being.  Sexual activity.  Eating habits.  History of falls.  Memory and ability to understand (cognition).  Work and work Statistician. Screening  You may have the following tests or measurements:  Height, weight, and BMI.  Blood pressure.  Lipid and cholesterol levels. These may be checked every 5 years, or more frequently if you are over 106 years old.  Skin check.  Lung cancer screening. You may have this screening every year starting at age 49 if you have a 30-pack-year history of smoking and currently smoke or have quit within the past 15 years.  Fecal occult blood test (FOBT) of the stool. You may have this test every year starting at age 96.  Flexible sigmoidoscopy or colonoscopy. You may have a sigmoidoscopy every 5 years or a colonoscopy every 10 years starting at age 76.  Prostate cancer screening. Recommendations will vary depending on your family history and other risks.  Hepatitis C blood test.  Hepatitis B blood test.  Sexually transmitted disease (STD) testing.  Diabetes screening. This is done by checking your blood sugar (glucose) after you have not eaten for a while (fasting). You may have this done every 1-3 years.  Abdominal aortic aneurysm (AAA) screening. You may need this if you are a current or former smoker.  Osteoporosis. You may be screened starting at age 1 if you are at high risk. Talk with your health  care provider about your test results, treatment options, and if necessary, the need for more tests. Vaccines  Your health care provider may recommend certain vaccines, such as:  Influenza vaccine. This is recommended every year.  Tetanus, diphtheria, and  acellular pertussis (Tdap, Td) vaccine. You may need a Td booster every 10 years.  Zoster vaccine. You may need this after age 16.  Pneumococcal 13-valent conjugate (PCV13) vaccine. One dose is recommended after age 35.  Pneumococcal polysaccharide (PPSV23) vaccine. One dose is recommended after age 65. Talk to your health care provider about which screenings and vaccines you need and how often you need them. This information is not intended to replace advice given to you by your health care provider. Make sure you discuss any questions you have with your health care provider. Document Released: 04/13/2015 Document Revised: 12/05/2015 Document Reviewed: 01/16/2015 Elsevier Interactive Patient Education  2017 [place] Prevention in the Home Falls can cause injuries. They can happen to people of all ages. There are many things you can do to make your home safe and to help prevent falls. What can I do on the outside of my home?  Regularly fix the edges of walkways and driveways and fix any cracks.  Remove anything that might make you trip as you walk through a door, such as a raised step or threshold.  Trim any bushes or trees on the path to your home.  Use bright outdoor lighting.  Clear any walking paths of anything that might make someone trip, such as rocks or tools.  Regularly check to see if handrails are loose or broken. Make sure that both sides of any steps have handrails.  Any raised decks and porches should have guardrails on the edges.  Have any leaves, snow, or ice cleared regularly.  Use sand or salt on walking paths during winter.  Clean up any spills in your garage right away. This includes oil or grease spills. What can I do in the bathroom?  Use night lights.  Install grab bars by the toilet and in the tub and shower. Do not use towel bars as grab bars.  Use non-skid mats or decals in the tub or shower.  If you need to sit down in the shower, use  a plastic, non-slip stool.  Keep the floor dry. Clean up any water that spills on the floor as soon as it happens.  Remove soap buildup in the tub or shower regularly.  Attach bath mats securely with double-sided non-slip rug tape.  Do not have throw rugs and other things on the floor that can make you trip. What can I do in the bedroom?  Use night lights.  Make sure that you have a light by your bed that is easy to reach.  Do not use any sheets or blankets that are too big for your bed. They should not hang down onto the floor.  Have a firm chair that has side arms. You can use this for support while you get dressed.  Do not have throw rugs and other things on the floor that can make you trip. What can I do in the kitchen?  Clean up any spills right away.  Avoid walking on wet floors.  Keep items that you use a lot in easy-to-reach places.  If you need to reach something above you, use a strong step stool that has a grab bar.  Keep electrical cords out of the way.  Do not use floor  polish or wax that makes floors slippery. If you must use wax, use non-skid floor wax.  Do not have throw rugs and other things on the floor that can make you trip. What can I do with my stairs?  Do not leave any items on the stairs.  Make sure that there are handrails on both sides of the stairs and use them. Fix handrails that are broken or loose. Make sure that handrails are as long as the stairways.  Check any carpeting to make sure that it is firmly attached to the stairs. Fix any carpet that is loose or worn.  Avoid having throw rugs at the top or bottom of the stairs. If you do have throw rugs, attach them to the floor with carpet tape.  Make sure that you have a light switch at the top of the stairs and the bottom of the stairs. If you do not have them, ask someone to add them for you. What else can I do to help prevent falls?  Wear shoes that:  Do not have high heels.  Have  rubber bottoms.  Are comfortable and fit you well.  Are closed at the toe. Do not wear sandals.  If you use a stepladder:  Make sure that it is fully opened. Do not climb a closed stepladder.  Make sure that both sides of the stepladder are locked into place.  Ask someone to hold it for you, if possible.  Clearly mark and make sure that you can see:  Any grab bars or handrails.  First and last steps.  Where the edge of each step is.  Use tools that help you move around (mobility aids) if they are needed. These include:  Canes.  Walkers.  Scooters.  Crutches.  Turn on the lights when you go into a dark area. Replace any light bulbs as soon as they burn out.  Set up your furniture so you have a clear path. Avoid moving your furniture around.  If any of your floors are uneven, fix them.  If there are any pets around you, be aware of where they are.  Review your medicines with your doctor. Some medicines can make you feel dizzy. This can increase your chance of falling. Ask your doctor what other things that you can do to help prevent falls. This information is not intended to replace advice given to you by your health care provider. Make sure you discuss any questions you have with your health care provider. Document Released: 01/11/2009 Document Revised: 08/23/2015 Document Reviewed: 04/21/2014 Elsevier Interactive Patient Education  2017 [employer].

## 2020-02-16 DIAGNOSIS — Z79899 Other long term (current) drug therapy: Secondary | ICD-10-CM | POA: Diagnosis not present

## 2020-03-07 ENCOUNTER — Other Ambulatory Visit: Payer: Self-pay | Admitting: Endocrinology

## 2020-03-07 ENCOUNTER — Other Ambulatory Visit: Payer: Self-pay | Admitting: Family Medicine

## 2020-03-16 ENCOUNTER — Telehealth: Payer: Self-pay | Admitting: Endocrinology

## 2020-03-16 ENCOUNTER — Other Ambulatory Visit: Payer: Self-pay

## 2020-03-16 ENCOUNTER — Other Ambulatory Visit (INDEPENDENT_AMBULATORY_CARE_PROVIDER_SITE_OTHER): Payer: Medicare HMO

## 2020-03-16 DIAGNOSIS — Z794 Long term (current) use of insulin: Secondary | ICD-10-CM

## 2020-03-16 DIAGNOSIS — E1165 Type 2 diabetes mellitus with hyperglycemia: Secondary | ICD-10-CM | POA: Diagnosis not present

## 2020-03-16 LAB — BASIC METABOLIC PANEL
BUN: 19 mg/dL (ref 6–23)
CO2: 28 mEq/L (ref 19–32)
Calcium: 9.4 mg/dL (ref 8.4–10.5)
Chloride: 105 mEq/L (ref 96–112)
Creatinine, Ser: 1.51 mg/dL — ABNORMAL HIGH (ref 0.40–1.50)
GFR: 48.67 mL/min — ABNORMAL LOW (ref >60.00)
Glucose, Bld: 224 mg/dL — ABNORMAL HIGH (ref 70–99)
Potassium: 3.8 mEq/L (ref 3.5–5.1)
Sodium: 139 mEq/L (ref 135–145)

## 2020-03-16 LAB — HEMOGLOBIN A1C: Hgb A1c MFr Bld: 6.5 % (ref 4.6–6.5)

## 2020-03-16 NOTE — Telephone Encounter (Signed)
Patient's One Touch Verio broke and was given a new one in office today. FYI

## 2020-03-21 ENCOUNTER — Ambulatory Visit: Payer: Medicare HMO | Admitting: Endocrinology

## 2020-03-21 ENCOUNTER — Encounter: Payer: Self-pay | Admitting: Endocrinology

## 2020-03-21 ENCOUNTER — Other Ambulatory Visit: Payer: Self-pay

## 2020-03-21 VITALS — BP 142/80 | HR 94 | Ht 73.0 in | Wt 295.5 lb

## 2020-03-21 DIAGNOSIS — Z794 Long term (current) use of insulin: Secondary | ICD-10-CM | POA: Diagnosis not present

## 2020-03-21 DIAGNOSIS — E1165 Type 2 diabetes mellitus with hyperglycemia: Secondary | ICD-10-CM

## 2020-03-21 DIAGNOSIS — N289 Disorder of kidney and ureter, unspecified: Secondary | ICD-10-CM | POA: Diagnosis not present

## 2020-03-21 DIAGNOSIS — I10 Essential (primary) hypertension: Secondary | ICD-10-CM

## 2020-03-21 DIAGNOSIS — Z23 Encounter for immunization: Secondary | ICD-10-CM | POA: Diagnosis not present

## 2020-03-21 NOTE — Progress Notes (Signed)
Patient ID: Matthew Barnes, male   DOB: Aug 03, 1955, 64 y.o.   MRN: [id number]    Reason for Appointment: Followup for Type 2 Diabetes    History of Present Illness:          Diagnosis: Type 2 diabetes mellitus, date of diagnosis:   2011       Past history:  He was started on metformin at diagnosis and apparently did have fairly good control initially However about a year later because of poor control he was given insulin in addition. He had been taking Lantus insulin until about 2/15, up to 80 units a day However he does not think his blood sugars  Were controlled with this Because of higher A1c of 13.1% he was referred here for diabetes management in 6/15; was started on glucose monitoring and insulin was changed from low dose Levemir to Humalog mix 70 units a day He was also started on Victoza  to help with blood sugar control and facilitate weight loss on his initial consultation  Recent history:   INSULIN regimen is described as: Novolin mix 70/30, 40 units at breakfast, 20 units in p.m. as needed  Oral hypoglycemic drugs the patient is taking are: Metformin 1 g twice a day  A1c is 6.5, was 7% but has been as low as 5.8   Current blood sugar patterns, management of diabetes and problems:  His blood sugars are quite erratic at different times of the day and overall high  He now says that he does not always take his insulin in the morning even though he is eating breakfast daily  He only takes the insulin in the morning when he remembers to do so unless he is in a hurry  Also will not proactively take any insulin at dinnertime when he is eating large meals with carbohydrate  Usually will feel symptomatic with hyperglycemia when blood sugars are higher at night and then he will go to take his insulin sometimes will allow checking  He says that he eats more carbohydrate and sweets when he has mood swings and this causes some high readings in the evenings  Even though his  depression is better he still has readings over 200 at times after eating  He was doing some occasional exercise on his recumbent bike but he says he needs a new one now  Weight is up 7 pounds   Side effects from medications have been: None  Glucose monitoring:  done about 1 times a day or less       Glucometer:  Verio   Blood Glucose readings and averages from download   PRE-MEAL  a.m. Lunch Dinner  evening Overall  Glucose range:  139-253    148-239  112-253  Mean/median:  159  148   190  173   Previous readings:  PRE-MEAL Fasting Lunch Dinner Bedtime Overall  Glucose range:  103-140  119-183  134-221    Mean/median:  120   175   143     Glycemic control:   Lab Results  Component Value Date   HGBA1C 6.5 03/16/2020   HGBA1C 7.0 (H) 10/18/2019   HGBA1C 5.8 07/08/2019   Lab Results  Component Value Date   MICROALBUR 1.1 07/14/2019   LDLCALC 68 07/11/2019   CREATININE 1.51 (H) 03/16/2020    Self-care: The diet that the patient has been following is: None, unable to control portions and carbs  when depressed  Meals: 3 meals per day (dinner 6 pm-10  pm)        Dietician visit: Most recent: never      CDE visit: 08/2013             Weight history: baseline 295  Wt Readings from Last 3 Encounters:  03/21/20 295 lb 8 oz (134 kg)  12/21/19 287 lb (130.2 kg)  11/29/19 285 lb (129.3 kg)   Lab on 03/16/2020  Component Date Value Ref Range Status  . Sodium 03/16/2020 139  135 - 145 mEq/L Final  . Potassium 03/16/2020 3.8  3.5 - 5.1 mEq/L Final  . Chloride 03/16/2020 105  96 - 112 mEq/L Final  . CO2 03/16/2020 28  19 - 32 mEq/L Final  . Glucose, Bld 03/16/2020 224* 70 - 99 mg/dL Final  . BUN 89/21/1941 19  6 - 23 mg/dL Final  . Creatinine, Ser 03/16/2020 1.51* 0.40 - 1.50 mg/dL Final  . GFR 74/10/1446 48.67* >60.00 mL/min Final   Calculated using the CKD-EPI Creatinine Equation (2021)  . Calcium 03/16/2020 9.4  8.4 - 10.5 mg/dL Final  . Hgb J8H MFr Bld 03/16/2020  6.5  4.6 - 6.5 % Final   Glycemic Control Guidelines for People with Diabetes:Non Diabetic:  <6%Goal of Therapy: <7%Additional Action Suggested:  >8%       Allergies as of 03/21/2020      Reactions   Influenza Vaccines Swelling   Reports of swelling of the arm and then sick for 10 days      Medication List       Accurate as of March 21, 2020  4:18 PM. If you have any questions, ask your nurse or doctor.        amLODipine 10 MG tablet Commonly known as: NORVASC TAKE 1 TABLET BY MOUTH EVERY DAY   Blood Pressure Cuff Misc Use as directed   gabapentin 300 MG capsule Commonly known as: NEURONTIN TAKE 1 CAPSULE BY MOUTH THREE TIMES A DAY   hydrochlorothiazide 25 MG tablet Commonly known as: HYDRODIURIL TAKE 1/2 TABLET BY MOUTH EVERY DAY   INSULIN SYRINGE 1CC/31GX5/16" 31G X 5/16" 1 ML Misc Use 3 needles per day   lithium carbonate 300 MG capsule Take 3 capsules (900 mg total) by mouth daily.   losartan 100 MG tablet Commonly known as: COZAAR TAKE 1 TABLET BY MOUTH EVERY DAY   lovastatin 20 MG tablet Commonly known as: MEVACOR Take 1 tablet (20 mg total) by mouth daily.   metFORMIN 1000 MG tablet Commonly known as: GLUCOPHAGE TAKE 1 TABLET BY MOUTH 2 TIMES DAILY WITH A MEAL.   NovoLIN 70/30 (70-30) 100 UNIT/ML injection Generic drug: insulin NPH-regular Human INJECT 40 UNITS UNDER THE SKIN BEFORE BREAKFAST AND 10 UNITS BEFORE SUPPER.   omeprazole 20 MG capsule Commonly known as: PRILOSEC TAKE 1 CAPSULE BY MOUTH EVERY DAY   OneTouch Verio test strip Generic drug: glucose blood USE AS INSTRUCTED TO CHECK BLOOD SUGAR THREE TIMES A DAY.   QUEtiapine 100 MG tablet Commonly known as: SEROQUEL Take 400 mg by mouth at bedtime.   sildenafil 50 MG tablet Commonly known as: Viagra Take 1 tablet (50 mg total) by mouth daily as needed for erectile dysfunction. Do not take more then once in 24 hours.       Allergies:  Allergies  Allergen Reactions  .  Influenza Vaccines Swelling    Reports of swelling of the arm and then sick for 10 days    Past Medical History:  Diagnosis Date  . Bipolar II disorder -  Managed by Jacqulyn Ducking [phone]) 05/03/2013  . Diabetes mellitus   . Hyperlipidemia   . Hypertension   . Unspecified hypothyroidism 05/03/2013    Past Surgical History:  Procedure Laterality Date  . KNEE SURGERY     scope on left knee    Family History  Problem Relation Age of Onset  . Diabetes Mother   . Hypertension Mother   . Heart disease Mother   . Heart disease Father   . Heart attack Father 47  . Diabetes Father   . Hypertension Father   . Depression Father   . Diabetes Sister   . Depression Paternal Grandmother   . Depression Other     Social History:  reports that he has never smoked. He has never used smokeless tobacco. He reports that he does not drink alcohol and does not use drugs.    Review of Systems   HYPERTENSION:  He is taking losartan 100 mg and Norvasc 10, HCTZ 12.5 prescribed by PCP Blood pressure readings as follows:  BP Readings from Last 3 Encounters:  03/21/20 (!) 142/80  12/21/19 (!) 142/82  11/29/19 130/70   Is having progressively higher creatinine levels, does not take any OTC nonsteroidal drugs  Lab Results  Component Value Date   CREATININE 1.51 (H) 03/16/2020   CREATININE 1.47 12/13/2019   CREATININE 1.37 10/18/2019         LIPIDS: He has had marked increase in triglycerides previously, now improved.  Currently on 20 mg lovastatin, also followed by PCP        Lab Results  Component Value Date   CHOL 128 07/11/2019   HDL 32.60 (L) 07/11/2019   LDLCALC 68 07/11/2019   LDLDIRECT 73.0 09/01/2017   TRIG 137.0 07/11/2019   CHOLHDL 4 07/11/2019       Thyroid:  Previously had mild hypothyroidism for a few years TSH subsequently has been normal consistently without taking any thyroid supplements  Is on lithium long-term  Lab Results  Component Value Date    TSH 1.34 12/13/2019        He has had some numbness in his feet, more on the right side, using gabapentin only as needed May use elastic stockings also with some relief  Findings on last exam:   Patchy decrease in sensation in the toes especially right and decreased on the plantar surface also   LABS:  Lab on 03/16/2020  Component Date Value Ref Range Status  . Sodium 03/16/2020 139  135 - 145 mEq/L Final  . Potassium 03/16/2020 3.8  3.5 - 5.1 mEq/L Final  . Chloride 03/16/2020 105  96 - 112 mEq/L Final  . CO2 03/16/2020 28  19 - 32 mEq/L Final  . Glucose, Bld 03/16/2020 224* 70 - 99 mg/dL Final  . BUN 67/59/1638 19  6 - 23 mg/dL Final  . Creatinine, Ser 03/16/2020 1.51* 0.40 - 1.50 mg/dL Final  . GFR 46/65/9935 48.67* >60.00 mL/min Final   Calculated using the CKD-EPI Creatinine Equation (2021)  . Calcium 03/16/2020 9.4  8.4 - 10.5 mg/dL Final  . Hgb T0V MFr Bld 03/16/2020 6.5  4.6 - 6.5 % Final   Glycemic Control Guidelines for People with Diabetes:Non Diabetic:  <6%Goal of Therapy: <7%Additional Action Suggested:  >8%     Physical Examination:  BP (!) 142/80   Pulse 94   Ht 6\' 1"  (1.854 m)   Wt 295 lb 8 oz (134 kg)   SpO2 98%   BMI 38.99 kg/m  ASSESSMENT/PLAN:   Diabetes type 2, on insulin  See history of present illness for detailed discussion of his current management, blood sugar patterns and problems identified  A1c is 6.5  He is on premixed insulin once a day with Metformin  Although A1c is fairly good his blood sugars are averaging 170 at home and generally high postprandially He is inconsistent with his insulin and does not take this regularly in the morning Most of his evening sugars are dependent on his compliance with controlling portions and carbohydrates including sweets Weight has gone up indicating overall increase in caloric intake and less exercise  Recommendations: He needs to take his morning insulin of 40 units consistently every  day regardless of what he is eating He will also need to take 20 units at dinnertime if he is planning to eat more carbohydrate or getting some sweets, may also take right after eating if more convenient Exercise daily Consistent diet Consider consultation with dietitian Also check fructosamine on next visit  May consider adding Jardiance even if he has to apply for patient assistance  RENAL dysfunction: Etiology is unclear and has no new medications or OTC drugs, will ask his PCP to evaluate or refer to nephrologist  Influenza vaccine given, he has previously been refusing it because of 1 episode of swelling in the arm after the injection  Follow-up in 3 months  Patient Instructions  Take shot in am daily  Take pm shot for hi carb meals  Check blood sugars on waking up 3-4 days a week  Also check blood sugars about 2 hours after meals and do this after different meals by rotation  Recommended blood sugar levels on waking up are 90-130 and about 2 hours after meal is 130-160  Please bring your blood sugar monitor to each visit, thank you       Elayne Snare 03/21/2020, 4:18 PM   Note: This office note was prepared with Dragon voice recognition system technology. Any transcriptional errors that result from this process are unintentional.

## 2020-03-21 NOTE — Patient Instructions (Signed)
Take shot in am daily  Take pm shot for hi carb meals  Check blood sugars on waking up 3-4 days a week  Also check blood sugars about 2 hours after meals and do this after different meals by rotation  Recommended blood sugar levels on waking up are 90-130 and about 2 hours after meal is 130-160  Please bring your blood sugar monitor to each visit, thank you

## 2020-04-19 ENCOUNTER — Other Ambulatory Visit: Payer: Self-pay | Admitting: Endocrinology

## 2020-05-03 ENCOUNTER — Encounter: Payer: Self-pay | Admitting: Adult Health

## 2020-05-03 ENCOUNTER — Telehealth (INDEPENDENT_AMBULATORY_CARE_PROVIDER_SITE_OTHER): Payer: Medicare HMO | Admitting: Adult Health

## 2020-05-03 ENCOUNTER — Other Ambulatory Visit: Payer: Self-pay

## 2020-05-03 VITALS — Wt 295.0 lb

## 2020-05-03 DIAGNOSIS — R0602 Shortness of breath: Secondary | ICD-10-CM | POA: Diagnosis not present

## 2020-05-03 NOTE — Progress Notes (Signed)
Virtual Visit via Video Note  I connected with Matthew Barnes on 05/03/20 at  1:30 PM EST by a video enabled telemedicine application and verified that I am speaking with the correct person using two identifiers.  Location patient: home Location provider:work or home office Persons participating in the virtual visit: patient, provider  I discussed the limitations of evaluation and management by telemedicine and the availability of in person appointments. The patient expressed understanding and agreed to proceed.   HPI: Is a 65 year old male, patient of Dr. Ethlyn Gallery I am seeing today for an acute visit.  He reports that over the last "few months" he has been experiencing shortness of breath and fatigue with rest and exertion, is much more apparent with exertion.  Reports that it takes him a few minutes to recover after minimal exertion such as walking around the house.  He does endorse wheezing at night.  Denies lower extremity swelling, cough, chest pain, fevers, or chills.  His symptoms have been becoming progressively worse over the last few months.  He denies any cardiac issues.  Does not smoke nor has he ever smoked.  Not had any new medication changes    ROS: See pertinent positives and negatives per HPI.  Past Medical History:  Diagnosis Date  . Bipolar II disorder - Managed by Marye Round [phone]) 05/03/2013  . Diabetes mellitus   . Hyperlipidemia   . Hypertension   . Unspecified hypothyroidism 05/03/2013    Past Surgical History:  Procedure Laterality Date  . KNEE SURGERY     scope on left knee    Family History  Problem Relation Age of Onset  . Diabetes Mother   . Hypertension Mother   . Heart disease Mother   . Heart disease Father   . Heart attack Father 110  . Diabetes Father   . Hypertension Father   . Depression Father   . Diabetes Sister   . Depression Paternal Grandmother   . Depression Other        Current Outpatient Medications:  .   amLODipine (NORVASC) 10 MG tablet, TAKE 1 TABLET BY MOUTH EVERY DAY, Disp: 90 tablet, Rfl: 2 .  Blood Pressure Monitoring (BLOOD PRESSURE CUFF) MISC, Use as directed, Disp: 1 each, Rfl: 0 .  gabapentin (NEURONTIN) 300 MG capsule, TAKE 1 CAPSULE BY MOUTH THREE TIMES A DAY, Disp: 270 capsule, Rfl: 0 .  hydrochlorothiazide (HYDRODIURIL) 25 MG tablet, TAKE 1/2 TABLET BY MOUTH EVERY DAY, Disp: 45 tablet, Rfl: 1 .  Insulin Syringe-Needle U-100 (INSULIN SYRINGE 1CC/31GX5/16") 31G X 5/16" 1 ML MISC, Use 3 needles per day, Disp: 100 each, Rfl: 3 .  lithium carbonate 300 MG capsule, Take 3 capsules (900 mg total) by mouth daily., Disp: , Rfl:  .  losartan (COZAAR) 100 MG tablet, TAKE 1 TABLET BY MOUTH EVERY DAY, Disp: 90 tablet, Rfl: 2 .  lovastatin (MEVACOR) 20 MG tablet, Take 1 tablet (20 mg total) by mouth daily., Disp: 90 tablet, Rfl: 2 .  metFORMIN (GLUCOPHAGE) 1000 MG tablet, TAKE 1 TABLET BY MOUTH 2 TIMES DAILY WITH A MEAL., Disp: 180 tablet, Rfl: 0 .  NOVOLIN 70/30 (70-30) 100 UNIT/ML injection, INJECT 40 UNITS UNDER THE SKIN BEFORE BREAKFAST AND 10 UNITS BEFORE SUPPER., Disp: 20 mL, Rfl: 2 .  omeprazole (PRILOSEC) 20 MG capsule, TAKE 1 CAPSULE BY MOUTH EVERY DAY, Disp: 90 capsule, Rfl: 1 .  ONETOUCH VERIO test strip, USE AS INSTRUCTED TO CHECK BLOOD SUGAR THREE TIMES A DAY., Disp: 300 each, Rfl:  1 .  QUEtiapine (SEROQUEL) 100 MG tablet, Take 400 mg by mouth at bedtime. , Disp: , Rfl:  .  sildenafil (VIAGRA) 50 MG tablet, Take 1 tablet (50 mg total) by mouth daily as needed for erectile dysfunction. Do not take more then once in 24 hours., Disp: 10 tablet, Rfl: 3  EXAM:  VITALS per patient if applicable:  GENERAL: alert, oriented, appears well and in no acute distress  HEENT: atraumatic, conjunttiva clear, no obvious abnormalities on inspection of external nose and ears  NECK: normal movements of the head and neck  LUNGS: on inspection no signs of respiratory distress, breathing rate appears  normal, no obvious gross SOB, gasping or wheezing  CV: no obvious cyanosis  MS: moves all visible extremities without noticeable abnormality  PSYCH/NEURO: pleasant and cooperative, no obvious depression or anxiety, speech and thought processing grossly intact  ASSESSMENT AND PLAN:  Discussed the following assessment and plan:  1. Shortness of breath -Since this has been ongoing for months, there is cardiac and or pulmonary  concern.  Advised patient that he will need to be seen in the office for further evaluation so that we can do lab work, probably get some imaging sense of the chest x-ray and do a physical exam on him.  He is okay with this plan of care and will get scheduled with his PCP.     I discussed the assessment and treatment plan with the patient. The patient was provided an opportunity to ask questions and all were answered. The patient agreed with the plan and demonstrated an understanding of the instructions.   The patient was advised to call back or seek an in-person evaluation if the symptoms worsen or if the condition fails to improve as anticipated.   Dorothyann Peng, NP

## 2020-05-09 ENCOUNTER — Ambulatory Visit: Payer: Medicare HMO | Admitting: Family Medicine

## 2020-05-09 NOTE — Progress Notes (Deleted)
  Matthew Barnes DOB: Aug 14, 1955 Encounter date: 05/09/2020  This is a 65 y.o. male who presents with No chief complaint on file.   History of present illness: Follow-up from visit with Matthew Barnes (virtual) 2/3. Shortness of breath and, at rest and with exertion.  Progressive symptoms in the last few months. HPI   Allergies  Allergen Reactions  . Influenza Vaccines Swelling    Reports of swelling of the arm and then sick for 10 days   No outpatient medications have been marked as taking for the 05/09/20 encounter (Appointment) with Caren Macadam, MD.    Review of Systems  Objective:  There were no vitals taken for this visit.      BP Readings from Last 3 Encounters:  03/21/20 (!) 142/80  12/21/19 (!) 142/82  11/29/19 130/70   Wt Readings from Last 3 Encounters:  05/03/20 295 lb (133.8 kg)  03/21/20 295 lb 8 oz (134 kg)  12/21/19 287 lb (130.2 kg)    Physical Exam  Assessment/Plan  There are no diagnoses linked to this encounter.       Matthew Rough, MD

## 2020-05-21 ENCOUNTER — Other Ambulatory Visit: Payer: Self-pay

## 2020-05-21 ENCOUNTER — Encounter: Payer: Self-pay | Admitting: Family Medicine

## 2020-05-21 ENCOUNTER — Ambulatory Visit (INDEPENDENT_AMBULATORY_CARE_PROVIDER_SITE_OTHER): Payer: Medicare HMO | Admitting: Family Medicine

## 2020-05-21 ENCOUNTER — Ambulatory Visit (INDEPENDENT_AMBULATORY_CARE_PROVIDER_SITE_OTHER): Payer: Medicare HMO

## 2020-05-21 VITALS — BP 130/90 | HR 107 | Temp 98.1°F | Wt 296.0 lb

## 2020-05-21 DIAGNOSIS — R0609 Other forms of dyspnea: Secondary | ICD-10-CM

## 2020-05-21 DIAGNOSIS — R000 Tachycardia, unspecified: Secondary | ICD-10-CM | POA: Diagnosis not present

## 2020-05-21 DIAGNOSIS — R1310 Dysphagia, unspecified: Secondary | ICD-10-CM

## 2020-05-21 DIAGNOSIS — R0600 Dyspnea, unspecified: Secondary | ICD-10-CM | POA: Diagnosis not present

## 2020-05-21 LAB — COMPREHENSIVE METABOLIC PANEL
ALT: 16 U/L (ref 0–53)
AST: 19 U/L (ref 0–37)
Albumin: 4.3 g/dL (ref 3.5–5.2)
Alkaline Phosphatase: 49 U/L (ref 39–117)
BUN: 15 mg/dL (ref 6–23)
CO2: 25 mEq/L (ref 19–32)
Calcium: 9.6 mg/dL (ref 8.4–10.5)
Chloride: 104 mEq/L (ref 96–112)
Creatinine, Ser: 1.38 mg/dL (ref 0.40–1.50)
GFR: 54.16 mL/min — ABNORMAL LOW (ref >60.00)
Glucose, Bld: 163 mg/dL — ABNORMAL HIGH (ref 70–99)
Potassium: 4.2 mEq/L (ref 3.5–5.1)
Sodium: 140 mEq/L (ref 135–145)
Total Bilirubin: 0.6 mg/dL (ref 0.2–1.2)
Total Protein: 6.7 g/dL (ref 6.0–8.3)

## 2020-05-21 LAB — CBC WITH DIFFERENTIAL/PLATELET
Basophils Absolute: 0.1 10*3/uL (ref 0.0–0.1)
Basophils Relative: 1 % (ref 0.0–3.0)
Eosinophils Absolute: 0.2 10*3/uL (ref 0.0–0.7)
Eosinophils Relative: 2.9 % (ref 0.0–5.0)
HCT: 42.3 % (ref 39.0–52.0)
Hemoglobin: 13.6 g/dL (ref 13.0–17.0)
Lymphocytes Relative: 19.1 % (ref 12.0–46.0)
Lymphs Abs: 1.5 10*3/uL (ref 0.7–4.0)
MCHC: 32.3 g/dL (ref 30.0–36.0)
MCV: 79.6 fl (ref 78.0–100.0)
Monocytes Absolute: 0.5 10*3/uL (ref 0.1–1.0)
Monocytes Relative: 6.5 % (ref 3.0–12.0)
Neutro Abs: 5.6 10*3/uL (ref 1.4–7.7)
Neutrophils Relative %: 70.5 % (ref 43.0–77.0)
Platelets: 156 10*3/uL (ref 150.0–400.0)
RBC: 5.31 Mil/uL (ref 4.22–5.81)
RDW: 15.8 % — ABNORMAL HIGH (ref 11.5–15.5)
WBC: 7.9 10*3/uL (ref 4.0–10.5)

## 2020-05-21 LAB — TSH: TSH: 4.41 u[IU]/mL (ref 0.35–4.50)

## 2020-05-21 LAB — T4, FREE: Free T4: 0.79 ng/dL (ref 0.60–1.60)

## 2020-05-21 LAB — HEMOGLOBIN A1C: Hgb A1c MFr Bld: 6.3 % (ref 4.6–6.5)

## 2020-05-21 IMAGING — DX DG CHEST 2V
2 series · 2 of 2 positions shown · non-contrast
Comparison: [DATE].

CLINICAL DATA: Dyspnea on exertion.

EXAM:
CHEST - 2 VIEW

[chest pa]
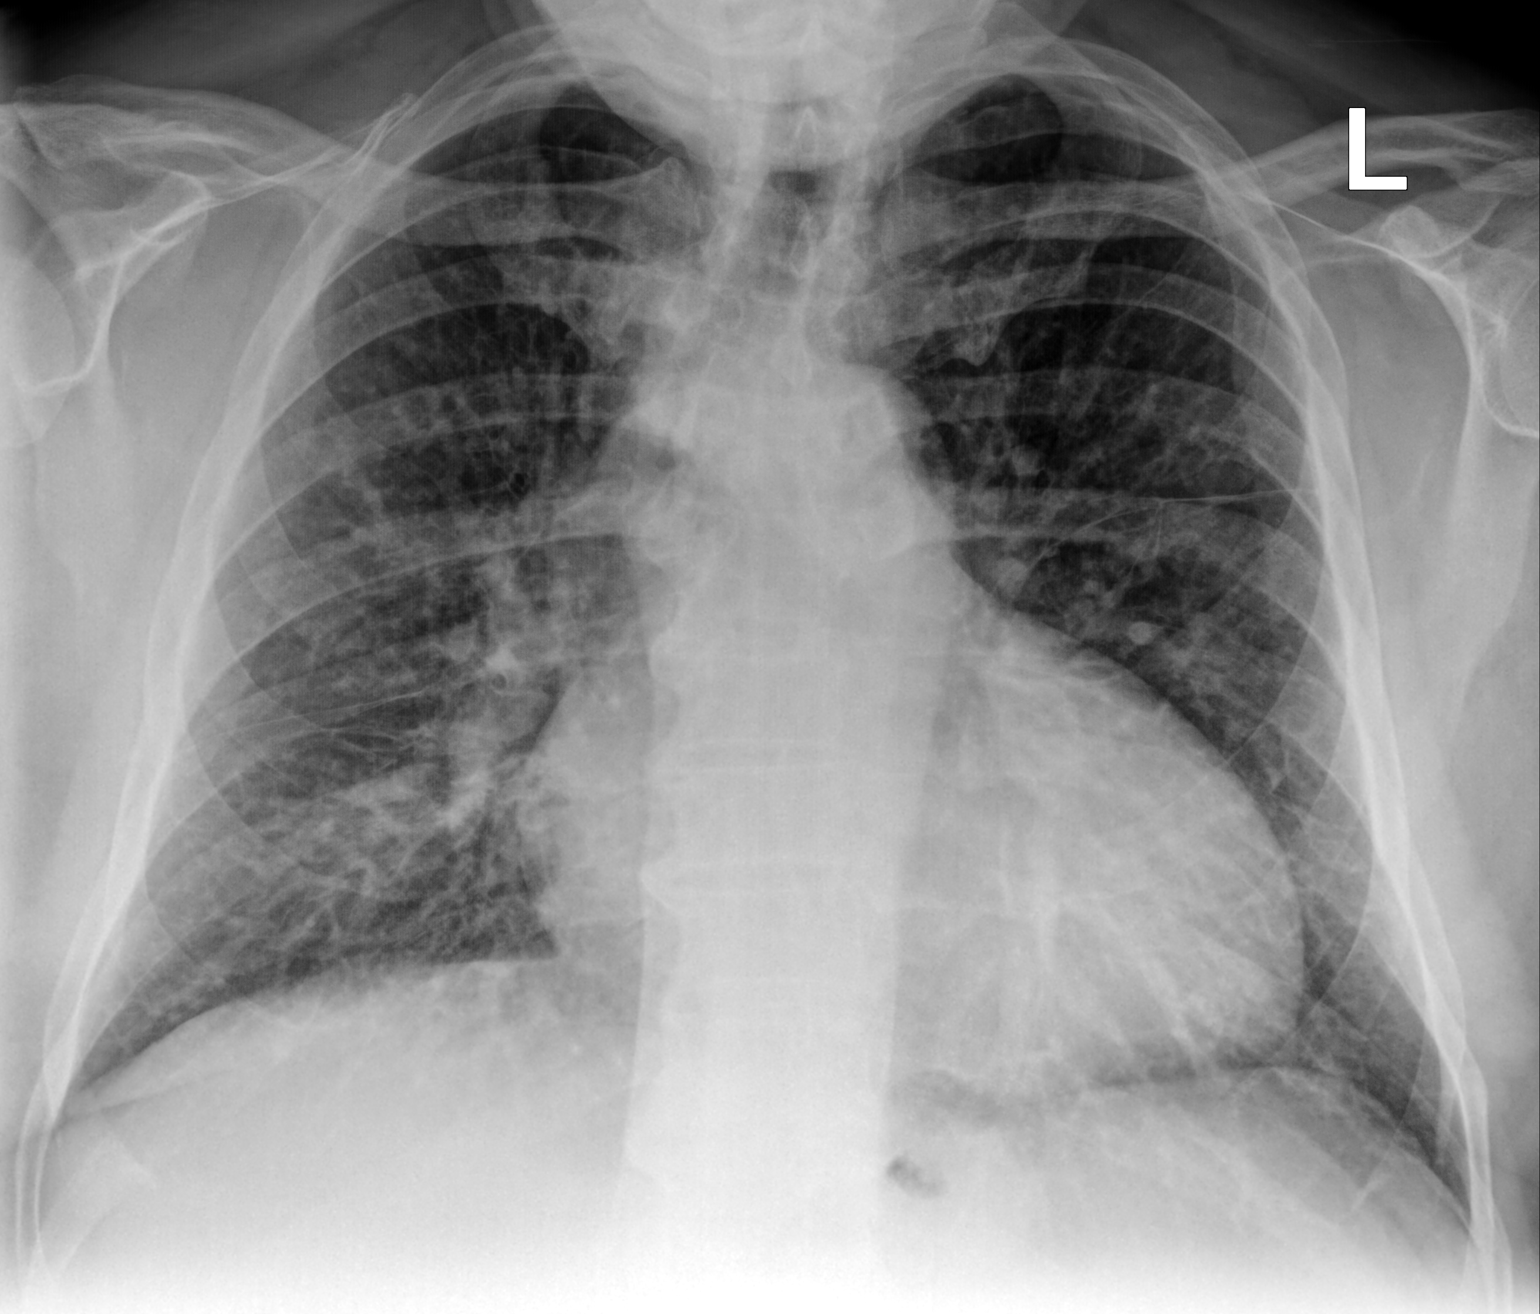

[chest lat]
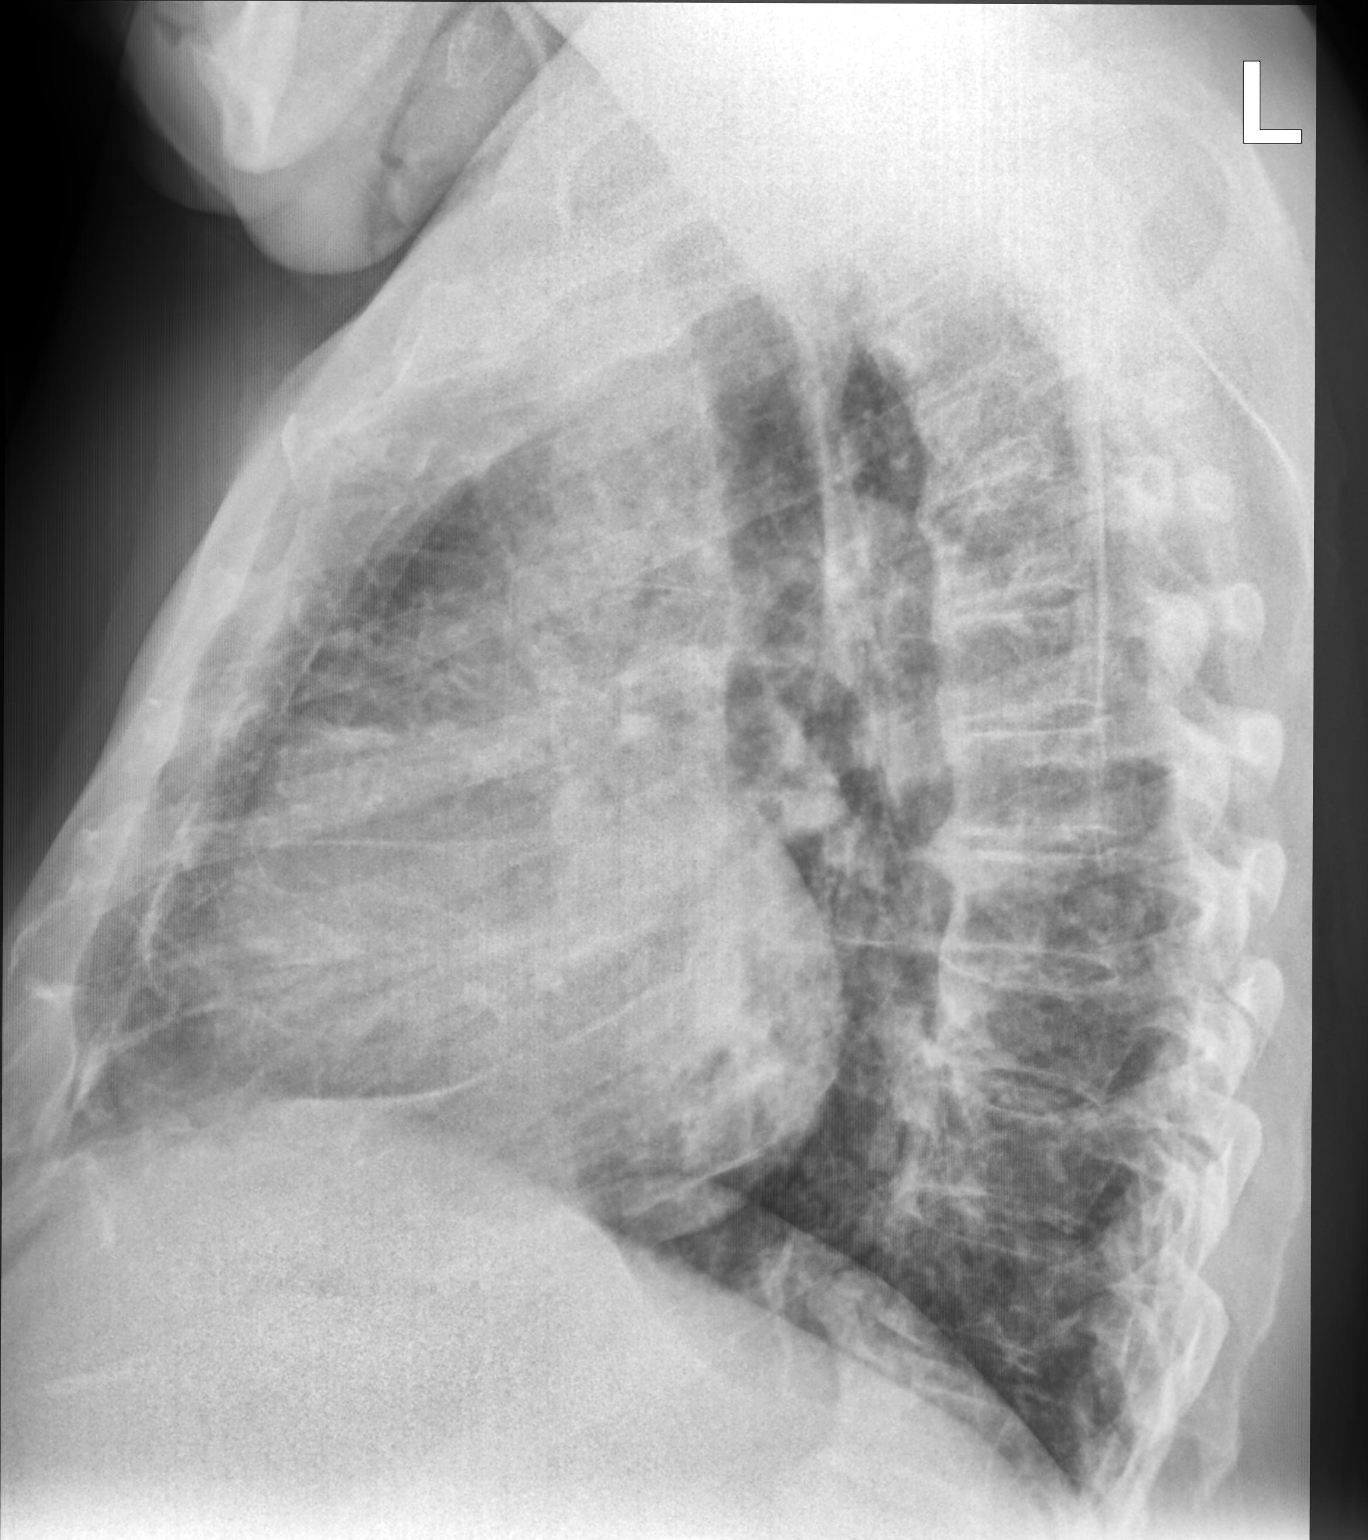

[2 of 2 positions shown; findings below may reference images not displayed]

FINDINGS: Stable cardiomediastinal silhouette. No pneumothorax or pleural
effusion is noted. Mild interstitial densities are noted throughout
both lungs concerning for possible pulmonary edema or atypical
inflammation. Bony thorax is unremarkable.
IMPRESSION: Mild bilateral interstitial densities are noted concerning for
possible pulmonary edema or atypical inflammation.

## 2020-05-21 MED ORDER — PANTOPRAZOLE SODIUM 20 MG PO TBEC
20.0000 mg | DELAYED_RELEASE_TABLET | Freq: Two times a day (BID) | ORAL | 0 refills | Status: DC
Start: 2020-05-21 — End: 2020-06-11

## 2020-05-21 NOTE — Patient Instructions (Signed)
Bradley's Neurology in Clinical Practice (8th ed., pp. 152-163). [place], [place]: Elsevier."> [employer] Textbook of Surgery (21st ed., pp. 1056-1078). [place], [place]: [employer].">  Dysphagia  Dysphagia is trouble swallowing. This condition occurs when solids and liquids stick in a person's throat on the way down to the stomach, or when food takes longer to get to the stomach than usual. You may have problems swallowing food, liquids, or both. You may also have pain while trying to swallow. It may take you more time and effort to swallow something. What are the causes? This condition may be caused by:  Muscle problems. These may make it difficult for you to move food and liquids through the esophagus, which is the tube that connects your mouth to your stomach.  Blockages. You may have ulcers, scar tissue, or inflammation that blocks the normal passage of food and liquids. Causes of these problems include: ? Acid reflux from your stomach into your esophagus (gastroesophageal reflux). ? Infections. ? Radiation treatment for cancer. ? Medicines taken without enough fluids to wash them down into your stomach.  Stroke. This can affect the nerves and make it difficult to swallow.  Nerve problems. These prevent signals from being sent to the muscles of your esophagus to squeeze (contract) and move what you swallow down to your stomach.  Globus pharyngeus. This is a common problem that involves a feeling like something is stuck in your throat or a sense of trouble with swallowing, even though nothing is wrong with the swallowing passages.  Certain conditions, such as cerebral palsy or Parkinson's disease. What are the signs or symptoms? Common symptoms of this condition include:  A feeling that solids or liquids are stuck in your throat on the way down to the stomach.  Pain while swallowing.  Coughing or gagging while trying to swallow. Other symptoms include:  Food moving back from  your stomach to your mouth (regurgitation).  Noises coming from your throat.  Chest discomfort when swallowing.  A feeling of fullness when swallowing.  Drooling, especially when the throat is blocked.  Heartburn. How is this diagnosed? This condition may be diagnosed by:  Barium swallow X-Nordahl. In this test, you will swallow a white liquid that sticks to the inside of your esophagus. X-Suminski images are then taken.  Endoscopy. In this test, a flexible telescope is inserted down your throat to look at your esophagus and your stomach.  CT scans or an MRI. How is this treated? Treatment for dysphagia depends on the cause of this condition:  If the dysphagia is caused by acid reflux or infection, medicines may be used. These may include antibiotics or heartburn medicines.  If the dysphagia is caused by problems with the muscles, swallowing therapy may be used to help you strengthen your swallowing muscles. You may have to do specific exercises to strengthen the muscles or stretch them.  If the dysphagia is caused by a blockage or mass, procedures to remove the blockage may be done. You may need surgery and a feeding tube. You may need to make diet changes. Ask your health care provider for specific instructions. Follow these instructions at home: Medicines  Take over-the-counter and prescription medicines only as told by your health care provider.  If you were prescribed an antibiotic medicine, take it as told by your health care provider. Do not stop taking the antibiotic even if you start to feel better. Eating and drinking  Make any diet changes as told by your health care provider.    Work with a diet and nutrition specialist (dietitian) to create an eating plan that will help you get the nutrients you need in order to stay healthy.  Eat soft foods that are easier to swallow.  Cut your food into small pieces and eat slowly. Take small bites.  Eat and drink only when you are  sitting upright.  Do not drink alcohol or caffeine. If you need help quitting, ask your health care provider.   General instructions  Check your weight every day to make sure you are not losing weight.  Do not use any products that contain nicotine or tobacco. These products include cigarettes, chewing tobacco, and vaping devices, such as e-cigarettes. If you need help quitting, ask your health care provider.  Keep all follow-up visits. This is important. Contact a health care provider if:  You lose weight because you cannot swallow.  You cough when you drink liquids.  You cough up partially digested food. Get help right away if:  You cannot swallow your saliva.  You have shortness of breath, a fever, or both.  Your voice is hoarse and you have trouble swallowing. These symptoms may represent a serious problem that is an emergency. Do not wait to see if the symptoms will go away. Get medical help right away. Call your local emergency services (911 in the [place] Do not drive yourself to the hospital. Summary  Dysphagia is trouble swallowing. This condition occurs when solids and liquids stick in a person's throat on the way down to the stomach. You may cough or gag while trying to swallow.  Dysphagia has many possible causes.  Treatment for dysphagia depends on the cause of the condition.  Keep all follow-up visits. This is important. This information is not intended to replace advice given to you by your health care provider. Make sure you discuss any questions you have with your health care provider. Document Revised: 11/05/2019 Document Reviewed: 11/05/2019 Elsevier Patient Education  [place] of Breath, Adult Shortness of breath is when a person has trouble breathing enough air or when a person feels like she or he is having trouble breathing in enough air. Shortness of breath could be a sign of a medical problem. Follow these instructions at  home:  Pay attention to any changes in your symptoms.  Do not use any products that contain nicotine or tobacco, such as cigarettes, e-cigarettes, and chewing tobacco.  Do not smoke. Smoking is a common cause of shortness of breath. If you need help quitting, ask your health care provider.  Avoid things that can irritate your airways, such as: ? Mold. ? Dust. ? Air pollution. ? Chemical fumes. ? Things that can cause allergy symptoms (allergens), if you have allergies.  Keep your living space clean and free of mold and dust.  Rest as needed. Slowly return to your usual activities.  Take over-the-counter and prescription medicines only as told by your health care provider. This includes oxygen therapy and inhaled medicines.  Keep all follow-up visits as told by your health care provider. This is important.   Contact a health care provider if:  Your condition does not improve as soon as expected.  You have a hard time doing your normal activities, even after you rest.  You have new symptoms. Get help right away if:  Your shortness of breath gets worse.  You have shortness of breath when you are resting.  You feel light-headed or you faint.  You have a cough  that is not controlled with medicines.  You cough up blood.  You have pain with breathing.  You have pain in your chest, arms, shoulders, or abdomen.  You have a fever.  You cannot walk up stairs or exercise the way that you normally do. These symptoms may represent a serious problem that is an emergency. Do not wait to see if the symptoms will go away. Get medical help right away. Call your local emergency services (911 in the [place] Do not drive yourself to the hospital. Summary  Shortness of breath is when a person has trouble breathing enough air. It can be a sign of a medical problem.  Avoid things that irritate your lungs, such as smoking, pollution, mold, and dust.  Pay attention to changes in your  symptoms and contact your health care provider if you have a hard time completing daily activities because of shortness of breath. This information is not intended to replace advice given to you by your health care provider. Make sure you discuss any questions you have with your health care provider. Document Revised: 08/17/2017 Document Reviewed: 08/17/2017 Elsevier Patient Education  2021 [employer].

## 2020-05-21 NOTE — Progress Notes (Signed)
Subjective:    Patient ID: Matthew Barnes, male    DOB: 10-Jul-1955, 65 y.o.   MRN: [id number]  Chief Complaint  Patient presents with  . Shortness of Breath    HPI Patient was seen today for ongoing concerns.  Pt endorses DOE x months with walking a short distance.  Wheezing at night.  Denies inability to lay flat at night, LE edema, or changes in weight.  Pt also with difficulty swallowing solids, bloating.  Feels like food gets stuck in lower chest, omeprazole daily no longer working.  Denies burning pain with eating or increased flatus.  Past Medical History:  Diagnosis Date  . Bipolar II disorder - Managed by Marye Round [phone]) 05/03/2013  . Diabetes mellitus   . Hyperlipidemia   . Hypertension   . Unspecified hypothyroidism 05/03/2013    Allergies  Allergen Reactions  . Influenza Vaccines Swelling    Reports of swelling of the arm and then sick for 10 days    ROS General: Denies fever, chills, night sweats, changes in weight, changes in appetite HEENT: Denies headaches, ear pain, changes in vision, rhinorrhea, sore throat CV: Denies CP, palpitations, SOB, orthopnea  + SOB Pulm: Deniescough, wheezing  + SOB, wheezing + GI: Denies abdominal pain, nausea, vomiting, diarrhea, constipation  + abdominal distention/bloating GU: Denies dysuria, hematuria, frequency Msk: Denies muscle cramps, joint pains Neuro: Denies weakness, numbness, tingling Skin: Denies rashes, bruising Psych: Denies depression, anxiety, hallucinations     Objective:    Blood pressure 130/90, pulse (!) 107, temperature 98.1 F (36.7 C), temperature source Oral, weight 296 lb (134.3 kg), SpO2 98 %.  Gen. Pleasant, well-nourished, in no distress, normal affect   HEENT: [place]/AT, face symmetric, conjunctiva clear, no scleral icterus, PERRLA, EOMI, nares patent without drainage Lungs: no accessory muscle use, CTAB, no wheezes or rales Cardiovascular: Tachycardic, no m/r/g, no peripheral  edema Abdomen: BS present, soft, NT, distended. Musculoskeletal: No deformities, no cyanosis or clubbing, normal tone Neuro:  A&Ox3, CN II-XII intact, normal gait.  Mild resting tremor of right hand. Skin:  Warm, no lesions/ rash   Wt Readings from Last 3 Encounters:  05/21/20 296 lb (134.3 kg)  05/03/20 295 lb (133.8 kg)  03/21/20 295 lb 8 oz (134 kg)    Lab Results  Component Value Date   WBC 8.4 06/14/2019   HGB 13.9 06/14/2019   HCT 42.2 06/14/2019   PLT 167.0 06/14/2019   GLUCOSE 224 (H) 03/16/2020   CHOL 128 07/11/2019   TRIG 137.0 07/11/2019   HDL 32.60 (L) 07/11/2019   LDLDIRECT 73.0 09/01/2017   LDLCALC 68 07/11/2019   ALT 30 06/14/2019   AST 27 06/14/2019   NA 139 03/16/2020   K 3.8 03/16/2020   CL 105 03/16/2020   CREATININE 1.51 (H) 03/16/2020   BUN 19 03/16/2020   CO2 28 03/16/2020   TSH 1.34 12/13/2019   PSA 1.90 09/04/2014   HGBA1C 6.5 03/16/2020   MICROALBUR 1.1 07/14/2019    Assessment/Plan:  Dysphagia, unspecified type  -Discussed possible causes including hiatal hernia, GERD -We will obtain CXR -We will discontinue omeprazole daily and start Protonix 20 mg twice daily -Referral placed to gastroenterology for further evaluation -Given handout - Plan: DG Chest 2 View, pantoprazole (PROTONIX) 20 MG tablet, CMP, CBC with Differential/Platelet, Ambulatory referral to Gastroenterology  DOE (dyspnea on exertion) -Discussed possible causes including cardiac versus pulmonary versus GI -We will obtain CXR -Discussed EKG, however patient unable to wait to have study done -We will  obtain labs -Given handout - Plan: DG Chest 2 View, CBC with Differential/Platelet, TSH, T4, free  Tachycardia -Asymptomatic -Heart rate 107 -Discussed EKG, however patient declines this visit.  States will return when has more time. - Plan: Hemoglobin A1c, Fructosamine, CBC, TSH, T4, free  F/u in 2 wks with PCP as pt did not want to be late meeting someone, so declined  EKG.  Also obtain 6 MWT at next OFV.  Grier Mitts, MD

## 2020-05-23 NOTE — Progress Notes (Signed)
Spoke with pt, is aware. 

## 2020-05-24 LAB — FRUCTOSAMINE: Fructosamine: 310 umol/L — ABNORMAL HIGH (ref 205–285)

## 2020-06-04 ENCOUNTER — Encounter: Payer: Self-pay | Admitting: Family Medicine

## 2020-06-04 ENCOUNTER — Ambulatory Visit (INDEPENDENT_AMBULATORY_CARE_PROVIDER_SITE_OTHER): Payer: Medicare HMO | Admitting: Family Medicine

## 2020-06-04 ENCOUNTER — Other Ambulatory Visit: Payer: Self-pay

## 2020-06-04 VITALS — BP 140/80 | HR 112 | Temp 98.0°F | Wt 291.4 lb

## 2020-06-04 DIAGNOSIS — F3181 Bipolar II disorder: Secondary | ICD-10-CM | POA: Diagnosis not present

## 2020-06-04 DIAGNOSIS — R0602 Shortness of breath: Secondary | ICD-10-CM

## 2020-06-04 DIAGNOSIS — R000 Tachycardia, unspecified: Secondary | ICD-10-CM | POA: Diagnosis not present

## 2020-06-04 DIAGNOSIS — R413 Other amnesia: Secondary | ICD-10-CM

## 2020-06-04 DIAGNOSIS — I10 Essential (primary) hypertension: Secondary | ICD-10-CM | POA: Diagnosis not present

## 2020-06-04 NOTE — Patient Instructions (Signed)
Shortness of Breath, Adult Shortness of breath is when a person has trouble breathing enough air or when a person feels like she or he is having trouble breathing in enough air. Shortness of breath could be a sign of a medical problem. Follow these instructions at home:  Pay attention to any changes in your symptoms.  Do not use any products that contain nicotine or tobacco, such as cigarettes, e-cigarettes, and chewing tobacco.  Do not smoke. Smoking is a common cause of shortness of breath. If you need help quitting, ask your health care provider.  Avoid things that can irritate your airways, such as: ? Mold. ? Dust. ? Air pollution. ? Chemical fumes. ? Things that can cause allergy symptoms (allergens), if you have allergies.  Keep your living space clean and free of mold and dust.  Rest as needed. Slowly return to your usual activities.  Take over-the-counter and prescription medicines only as told by your health care provider. This includes oxygen therapy and inhaled medicines.  Keep all follow-up visits as told by your health care provider. This is important.   Contact a health care provider if:  Your condition does not improve as soon as expected.  You have a hard time doing your normal activities, even after you rest.  You have new symptoms. Get help right away if:  Your shortness of breath gets worse.  You have shortness of breath when you are resting.  You feel light-headed or you faint.  You have a cough that is not controlled with medicines.  You cough up blood.  You have pain with breathing.  You have pain in your chest, arms, shoulders, or abdomen.  You have a fever.  You cannot walk up stairs or exercise the way that you normally do. These symptoms may represent a serious problem that is an emergency. Do not wait to see if the symptoms will go away. Get medical help right away. Call your local emergency services (911 in the [place] Do not drive yourself  to the hospital. Summary  Shortness of breath is when a person has trouble breathing enough air. It can be a sign of a medical problem.  Avoid things that irritate your lungs, such as smoking, pollution, mold, and dust.  Pay attention to changes in your symptoms and contact your health care provider if you have a hard time completing daily activities because of shortness of breath. This information is not intended to replace advice given to you by your health care provider. Make sure you discuss any questions you have with your health care provider. Document Revised: 08/17/2017 Document Reviewed: 08/17/2017 Elsevier Patient Education  2021 [employer].

## 2020-06-04 NOTE — Progress Notes (Signed)
Subjective:    Patient ID: Matthew Barnes, male    DOB: 1956-02-16, 65 y.o.   MRN: [id number]  Chief Complaint  Patient presents with  . Follow-up    HPI Patient is a 65 year old male with history significant for 2, HLD, HTN, bipolar 2 do followed by Dr. Ethlyn Gallery who was seen for f/u on SOB and CXR results.  Pt states SOB has improved since last OFV.  Pt does not recall any changes in diet or activity that may have contributed.  Has occasional LE edema, on HCTZ 12.5 mg.  Patient notes changes in memory.  Had normal lithium level when last seen by psychiatry.  Past Medical History:  Diagnosis Date  . Bipolar II disorder - Managed by Marye Round [phone]) 05/03/2013  . Diabetes mellitus   . Hyperlipidemia   . Hypertension   . Unspecified hypothyroidism 05/03/2013    Allergies  Allergen Reactions  . Influenza Vaccines Swelling    Reports of swelling of the arm and then sick for 10 days    ROS General: Denies fever, chills, night sweats, changes in weight, changes in appetite  +memory concerns HEENT: Denies headaches, ear pain, changes in vision, rhinorrhea, sore throat CV: Denies CP, palpitations, SOB, orthopnea +SOB, intermittent LE edema Pulm: Denies SOB, cough, wheezing +SOB GI: Denies abdominal pain, nausea, vomiting, diarrhea, constipation GU: Denies dysuria, hematuria, frequency  Msk: Denies muscle cramps, joint pains Neuro: Denies weakness, numbness, tingling Skin: Denies rashes, bruising Psych: Denies depression, anxiety, hallucinations    Objective:    Blood pressure 140/80, pulse (!) 112, temperature 98 F (36.7 C), temperature source Oral, weight 291 lb 6.4 oz (132.2 kg), SpO2 93 %.  Gen. Pleasant, well-nourished, in no distress, normal affect   HEENT: [place]/AT, face symmetric, conjunctiva clear, no scleral icterus, PERRLA, EOMI, nares patent without drainage, pharynx without erythema or exudate. Neck: No JVD, no thyromegaly, no carotid bruits Lungs: no  accessory muscle use, CTAB, no wheezes or rales Cardiovascular: Tachycardia, no m/r/g, no peripheral edema Musculoskeletal: No deformities, no cyanosis or clubbing, normal tone Neuro:  A&Ox3, CN II-XII intact, normal gait Skin:  Warm, no lesions/ rash  Wt Readings from Last 3 Encounters:  06/04/20 291 lb 6.4 oz (132.2 kg)  05/21/20 296 lb (134.3 kg)  05/03/20 295 lb (133.8 kg)    Lab Results  Component Value Date   WBC 7.9 05/21/2020   HGB 13.6 05/21/2020   HCT 42.3 05/21/2020   PLT 156.0 05/21/2020   GLUCOSE 163 (H) 05/21/2020   CHOL 128 07/11/2019   TRIG 137.0 07/11/2019   HDL 32.60 (L) 07/11/2019   LDLDIRECT 73.0 09/01/2017   LDLCALC 68 07/11/2019   ALT 16 05/21/2020   AST 19 05/21/2020   NA 140 05/21/2020   K 4.2 05/21/2020   CL 104 05/21/2020   CREATININE 1.38 05/21/2020   BUN 15 05/21/2020   CO2 25 05/21/2020   TSH 4.41 05/21/2020   PSA 1.90 09/04/2014   HGBA1C 6.3 05/21/2020   MICROALBUR 1.1 07/14/2019    Assessment/Plan:  Memory impairment  -MOCA score 18/30 with extra point added for education </= 12 yrs -MRI brain w wo contrast 07/13/19 with T2 hyperintensity in the supratentorial white matter is nonspecific but may reflect minor chronic microvascular ischemic changes.  Prominence of ventricles and sulci reflects mild generalized parenchymal volume loss. -discussed obtaining labs to evaluate reversible causes -consider Neruopsych testing. -MRI brain ordered by pcp. -continue f/u with Neurology - Plan: Vitamin B12, Vitamin D, 25-hydroxy, Brain Natriuretic  Peptide, Folate, CBC with Differential/Platelet, TSH, CMP, Lithium level  SOB (shortness of breath)  -improving -CXR 05/21/20 with mild b/l interstitial densities concerning for possible pulm edema or atypical inflammation. -would repeat CXR to ensure resolution of densities.   -continue HCTZ 12.5 mg - Plan: Brain Natriuretic Peptide  Bipolar 2 disorder ([place])  -stable -continue lithium and other  meds. -continue f/u with Psych - Plan: Vitamin D, 25-hydroxy, TSH, Lithium level  Essential hypertension  -elevated -continue current meds: HCTZ 12.5 mg, Norvasc 10 mg, losartan 100 mg -consider med adjustments (HCTZ) if lithium level difficult to control -lifestyle modifications encouraged - Plan: CMP  Tachycardia -will obtain labs -continue to monitor  F/u prn  Grier Mitts, MD

## 2020-06-05 ENCOUNTER — Ambulatory Visit: Payer: Medicare HMO | Admitting: Gastroenterology

## 2020-06-07 ENCOUNTER — Other Ambulatory Visit: Payer: Self-pay | Admitting: Family Medicine

## 2020-06-07 ENCOUNTER — Other Ambulatory Visit: Payer: Self-pay | Admitting: Endocrinology

## 2020-06-07 DIAGNOSIS — R1310 Dysphagia, unspecified: Secondary | ICD-10-CM

## 2020-06-16 ENCOUNTER — Encounter: Payer: Self-pay | Admitting: Family Medicine

## 2020-07-06 ENCOUNTER — Other Ambulatory Visit: Payer: Self-pay | Admitting: Family Medicine

## 2020-07-06 DIAGNOSIS — R1310 Dysphagia, unspecified: Secondary | ICD-10-CM

## 2020-07-11 DIAGNOSIS — Z0100 Encounter for examination of eyes and vision without abnormal findings: Secondary | ICD-10-CM | POA: Diagnosis not present

## 2020-07-24 ENCOUNTER — Ambulatory Visit: Payer: Medicare HMO | Admitting: Gastroenterology

## 2020-07-28 ENCOUNTER — Other Ambulatory Visit: Payer: Self-pay | Admitting: Family Medicine

## 2020-07-28 DIAGNOSIS — R1310 Dysphagia, unspecified: Secondary | ICD-10-CM

## 2020-07-30 ENCOUNTER — Emergency Department (HOSPITAL_COMMUNITY): Payer: Medicare HMO

## 2020-07-30 ENCOUNTER — Inpatient Hospital Stay (HOSPITAL_COMMUNITY)
Admission: EM | Admit: 2020-07-30 | Discharge: 2020-08-09 | DRG: 070 | Disposition: A | Discharge status: Home Health | Payer: Medicare HMO | Attending: Internal Medicine | Admitting: Internal Medicine

## 2020-07-30 ENCOUNTER — Encounter (HOSPITAL_COMMUNITY): Payer: Self-pay

## 2020-07-30 ENCOUNTER — Other Ambulatory Visit: Payer: Self-pay

## 2020-07-30 DIAGNOSIS — I5043 Acute on chronic combined systolic (congestive) and diastolic (congestive) heart failure: Secondary | ICD-10-CM | POA: Diagnosis present

## 2020-07-30 DIAGNOSIS — E1122 Type 2 diabetes mellitus with diabetic chronic kidney disease: Secondary | ICD-10-CM | POA: Diagnosis present

## 2020-07-30 DIAGNOSIS — N1831 Chronic kidney disease, stage 3a: Secondary | ICD-10-CM | POA: Diagnosis present

## 2020-07-30 DIAGNOSIS — R4182 Altered mental status, unspecified: Secondary | ICD-10-CM | POA: Diagnosis present

## 2020-07-30 DIAGNOSIS — R778 Other specified abnormalities of plasma proteins: Secondary | ICD-10-CM | POA: Diagnosis not present

## 2020-07-30 DIAGNOSIS — J323 Chronic sphenoidal sinusitis: Secondary | ICD-10-CM | POA: Diagnosis not present

## 2020-07-30 DIAGNOSIS — R4701 Aphasia: Secondary | ICD-10-CM | POA: Diagnosis present

## 2020-07-30 DIAGNOSIS — E785 Hyperlipidemia, unspecified: Secondary | ICD-10-CM | POA: Diagnosis present

## 2020-07-30 DIAGNOSIS — J80 Acute respiratory distress syndrome: Secondary | ICD-10-CM | POA: Diagnosis not present

## 2020-07-30 DIAGNOSIS — I501 Left ventricular failure: Secondary | ICD-10-CM | POA: Diagnosis not present

## 2020-07-30 DIAGNOSIS — R079 Chest pain, unspecified: Secondary | ICD-10-CM | POA: Diagnosis not present

## 2020-07-30 DIAGNOSIS — H1089 Other conjunctivitis: Secondary | ICD-10-CM | POA: Diagnosis present

## 2020-07-30 DIAGNOSIS — Z79899 Other long term (current) drug therapy: Secondary | ICD-10-CM | POA: Diagnosis not present

## 2020-07-30 DIAGNOSIS — E1140 Type 2 diabetes mellitus with diabetic neuropathy, unspecified: Secondary | ICD-10-CM | POA: Diagnosis present

## 2020-07-30 DIAGNOSIS — I152 Hypertension secondary to endocrine disorders: Secondary | ICD-10-CM | POA: Diagnosis present

## 2020-07-30 DIAGNOSIS — R1310 Dysphagia, unspecified: Secondary | ICD-10-CM | POA: Diagnosis present

## 2020-07-30 DIAGNOSIS — F3181 Bipolar II disorder: Secondary | ICD-10-CM | POA: Diagnosis present

## 2020-07-30 DIAGNOSIS — R0902 Hypoxemia: Secondary | ICD-10-CM | POA: Diagnosis not present

## 2020-07-30 DIAGNOSIS — R000 Tachycardia, unspecified: Secondary | ICD-10-CM | POA: Diagnosis not present

## 2020-07-30 DIAGNOSIS — I10 Essential (primary) hypertension: Secondary | ICD-10-CM | POA: Diagnosis not present

## 2020-07-30 DIAGNOSIS — E1159 Type 2 diabetes mellitus with other circulatory complications: Secondary | ICD-10-CM | POA: Diagnosis not present

## 2020-07-30 DIAGNOSIS — R29818 Other symptoms and signs involving the nervous system: Secondary | ICD-10-CM | POA: Diagnosis not present

## 2020-07-30 DIAGNOSIS — I5042 Chronic combined systolic (congestive) and diastolic (congestive) heart failure: Secondary | ICD-10-CM | POA: Diagnosis present

## 2020-07-30 DIAGNOSIS — R0602 Shortness of breath: Secondary | ICD-10-CM | POA: Diagnosis not present

## 2020-07-30 DIAGNOSIS — I5023 Acute on chronic systolic (congestive) heart failure: Secondary | ICD-10-CM | POA: Diagnosis not present

## 2020-07-30 DIAGNOSIS — G9340 Encephalopathy, unspecified: Principal | ICD-10-CM | POA: Diagnosis present

## 2020-07-30 DIAGNOSIS — K219 Gastro-esophageal reflux disease without esophagitis: Secondary | ICD-10-CM | POA: Diagnosis present

## 2020-07-30 DIAGNOSIS — N179 Acute kidney failure, unspecified: Secondary | ICD-10-CM | POA: Diagnosis present

## 2020-07-30 DIAGNOSIS — R61 Generalized hyperhidrosis: Secondary | ICD-10-CM | POA: Diagnosis not present

## 2020-07-30 DIAGNOSIS — I517 Cardiomegaly: Secondary | ICD-10-CM | POA: Diagnosis not present

## 2020-07-30 DIAGNOSIS — E1169 Type 2 diabetes mellitus with other specified complication: Secondary | ICD-10-CM | POA: Diagnosis present

## 2020-07-30 DIAGNOSIS — R059 Cough, unspecified: Secondary | ICD-10-CM | POA: Diagnosis not present

## 2020-07-30 DIAGNOSIS — I209 Angina pectoris, unspecified: Secondary | ICD-10-CM | POA: Diagnosis not present

## 2020-07-30 DIAGNOSIS — Z7984 Long term (current) use of oral hypoglycemic drugs: Secondary | ICD-10-CM

## 2020-07-30 DIAGNOSIS — G319 Degenerative disease of nervous system, unspecified: Secondary | ICD-10-CM | POA: Diagnosis not present

## 2020-07-30 DIAGNOSIS — R479 Unspecified speech disturbances: Secondary | ICD-10-CM

## 2020-07-30 DIAGNOSIS — E7800 Pure hypercholesterolemia, unspecified: Secondary | ICD-10-CM | POA: Diagnosis not present

## 2020-07-30 DIAGNOSIS — J019 Acute sinusitis, unspecified: Secondary | ICD-10-CM | POA: Diagnosis not present

## 2020-07-30 DIAGNOSIS — F3160 Bipolar disorder, current episode mixed, unspecified: Secondary | ICD-10-CM | POA: Diagnosis present

## 2020-07-30 DIAGNOSIS — E1165 Type 2 diabetes mellitus with hyperglycemia: Secondary | ICD-10-CM | POA: Diagnosis present

## 2020-07-30 DIAGNOSIS — Z833 Family history of diabetes mellitus: Secondary | ICD-10-CM | POA: Diagnosis not present

## 2020-07-30 DIAGNOSIS — I2729 Other secondary pulmonary hypertension: Secondary | ICD-10-CM | POA: Diagnosis present

## 2020-07-30 DIAGNOSIS — I5022 Chronic systolic (congestive) heart failure: Secondary | ICD-10-CM | POA: Diagnosis present

## 2020-07-30 DIAGNOSIS — Z20822 Contact with and (suspected) exposure to covid-19: Secondary | ICD-10-CM | POA: Diagnosis present

## 2020-07-30 DIAGNOSIS — I420 Dilated cardiomyopathy: Secondary | ICD-10-CM

## 2020-07-30 DIAGNOSIS — F329 Major depressive disorder, single episode, unspecified: Secondary | ICD-10-CM | POA: Diagnosis not present

## 2020-07-30 DIAGNOSIS — F0390 Unspecified dementia without behavioral disturbance: Secondary | ICD-10-CM | POA: Diagnosis present

## 2020-07-30 DIAGNOSIS — Z794 Long term (current) use of insulin: Secondary | ICD-10-CM | POA: Diagnosis not present

## 2020-07-30 DIAGNOSIS — E039 Hypothyroidism, unspecified: Secondary | ICD-10-CM | POA: Diagnosis present

## 2020-07-30 DIAGNOSIS — I130 Hypertensive heart and chronic kidney disease with heart failure and stage 1 through stage 4 chronic kidney disease, or unspecified chronic kidney disease: Secondary | ICD-10-CM | POA: Diagnosis present

## 2020-07-30 DIAGNOSIS — I340 Nonrheumatic mitral (valve) insufficiency: Secondary | ICD-10-CM | POA: Diagnosis not present

## 2020-07-30 DIAGNOSIS — F419 Anxiety disorder, unspecified: Secondary | ICD-10-CM | POA: Diagnosis present

## 2020-07-30 DIAGNOSIS — Z6837 Body mass index (BMI) 37.0-37.9, adult: Secondary | ICD-10-CM

## 2020-07-30 DIAGNOSIS — Z9114 Patient's other noncompliance with medication regimen: Secondary | ICD-10-CM

## 2020-07-30 DIAGNOSIS — R7989 Other specified abnormal findings of blood chemistry: Secondary | ICD-10-CM

## 2020-07-30 DIAGNOSIS — J3489 Other specified disorders of nose and nasal sinuses: Secondary | ICD-10-CM | POA: Diagnosis not present

## 2020-07-30 LAB — COMPREHENSIVE METABOLIC PANEL
ALT: 30 U/L (ref 0–44)
AST: 33 U/L (ref 15–41)
Albumin: 4 g/dL (ref 3.5–5.0)
Alkaline Phosphatase: 49 U/L (ref 38–126)
Anion gap: 9 (ref 5–15)
BUN: 14 mg/dL (ref 8–23)
CO2: 23 mmol/L (ref 22–32)
Calcium: 9.1 mg/dL (ref 8.9–10.3)
Chloride: 106 mmol/L (ref 98–111)
Creatinine, Ser: 1.24 mg/dL (ref 0.61–1.24)
GFR, Estimated: >60 mL/min (ref >60)
Glucose, Bld: 207 mg/dL — ABNORMAL HIGH (ref 70–99)
Potassium: 3.8 mmol/L (ref 3.5–5.1)
Sodium: 138 mmol/L (ref 135–145)
Total Bilirubin: 1.2 mg/dL (ref 0.3–1.2)
Total Protein: 7.3 g/dL (ref 6.5–8.1)

## 2020-07-30 LAB — CBC WITH DIFFERENTIAL/PLATELET
Abs Immature Granulocytes: 0.02 10*3/uL (ref 0.00–0.07)
Basophils Absolute: 0.1 10*3/uL (ref 0.0–0.1)
Basophils Relative: 1 %
Eosinophils Absolute: 0.2 10*3/uL (ref 0.0–0.5)
Eosinophils Relative: 3 %
HCT: 40.3 % (ref 39.0–52.0)
Hemoglobin: 12.2 g/dL — ABNORMAL LOW (ref 13.0–17.0)
Immature Granulocytes: 0 %
Lymphocytes Relative: 21 %
Lymphs Abs: 1.7 10*3/uL (ref 0.7–4.0)
MCH: 25.2 pg — ABNORMAL LOW (ref 26.0–34.0)
MCHC: 30.3 g/dL (ref 30.0–36.0)
MCV: 83.1 fL (ref 80.0–100.0)
Monocytes Absolute: 0.8 10*3/uL (ref 0.1–1.0)
Monocytes Relative: 10 %
Neutro Abs: 5.5 10*3/uL (ref 1.7–7.7)
Neutrophils Relative %: 65 %
Platelets: 203 10*3/uL (ref 150–400)
RBC: 4.85 MIL/uL (ref 4.22–5.81)
RDW: 14.2 % (ref 11.5–15.5)
WBC: 8.4 10*3/uL (ref 4.0–10.5)
nRBC: 0 % (ref 0.0–0.2)

## 2020-07-30 LAB — ETHANOL: Alcohol, Ethyl (B): <10 mg/dL (ref <10)

## 2020-07-30 LAB — RAPID URINE DRUG SCREEN, HOSP PERFORMED
Amphetamines: NOT DETECTED
Barbiturates: NOT DETECTED
Benzodiazepines: NOT DETECTED
Cocaine: NOT DETECTED
Opiates: NOT DETECTED
Tetrahydrocannabinol: NOT DETECTED

## 2020-07-30 LAB — RESP PANEL BY RT-PCR (FLU A&B, COVID) ARPGX2
Influenza A by PCR: NEGATIVE
Influenza B by PCR: NEGATIVE
SARS Coronavirus 2 by RT PCR: NEGATIVE

## 2020-07-30 IMAGING — CR DG CHEST 2V
2 series · 2 of 2 positions shown · non-contrast
Comparison: [DATE]

CLINICAL DATA: Cough

EXAM:
CHEST - 2 VIEW

[w chest pa]
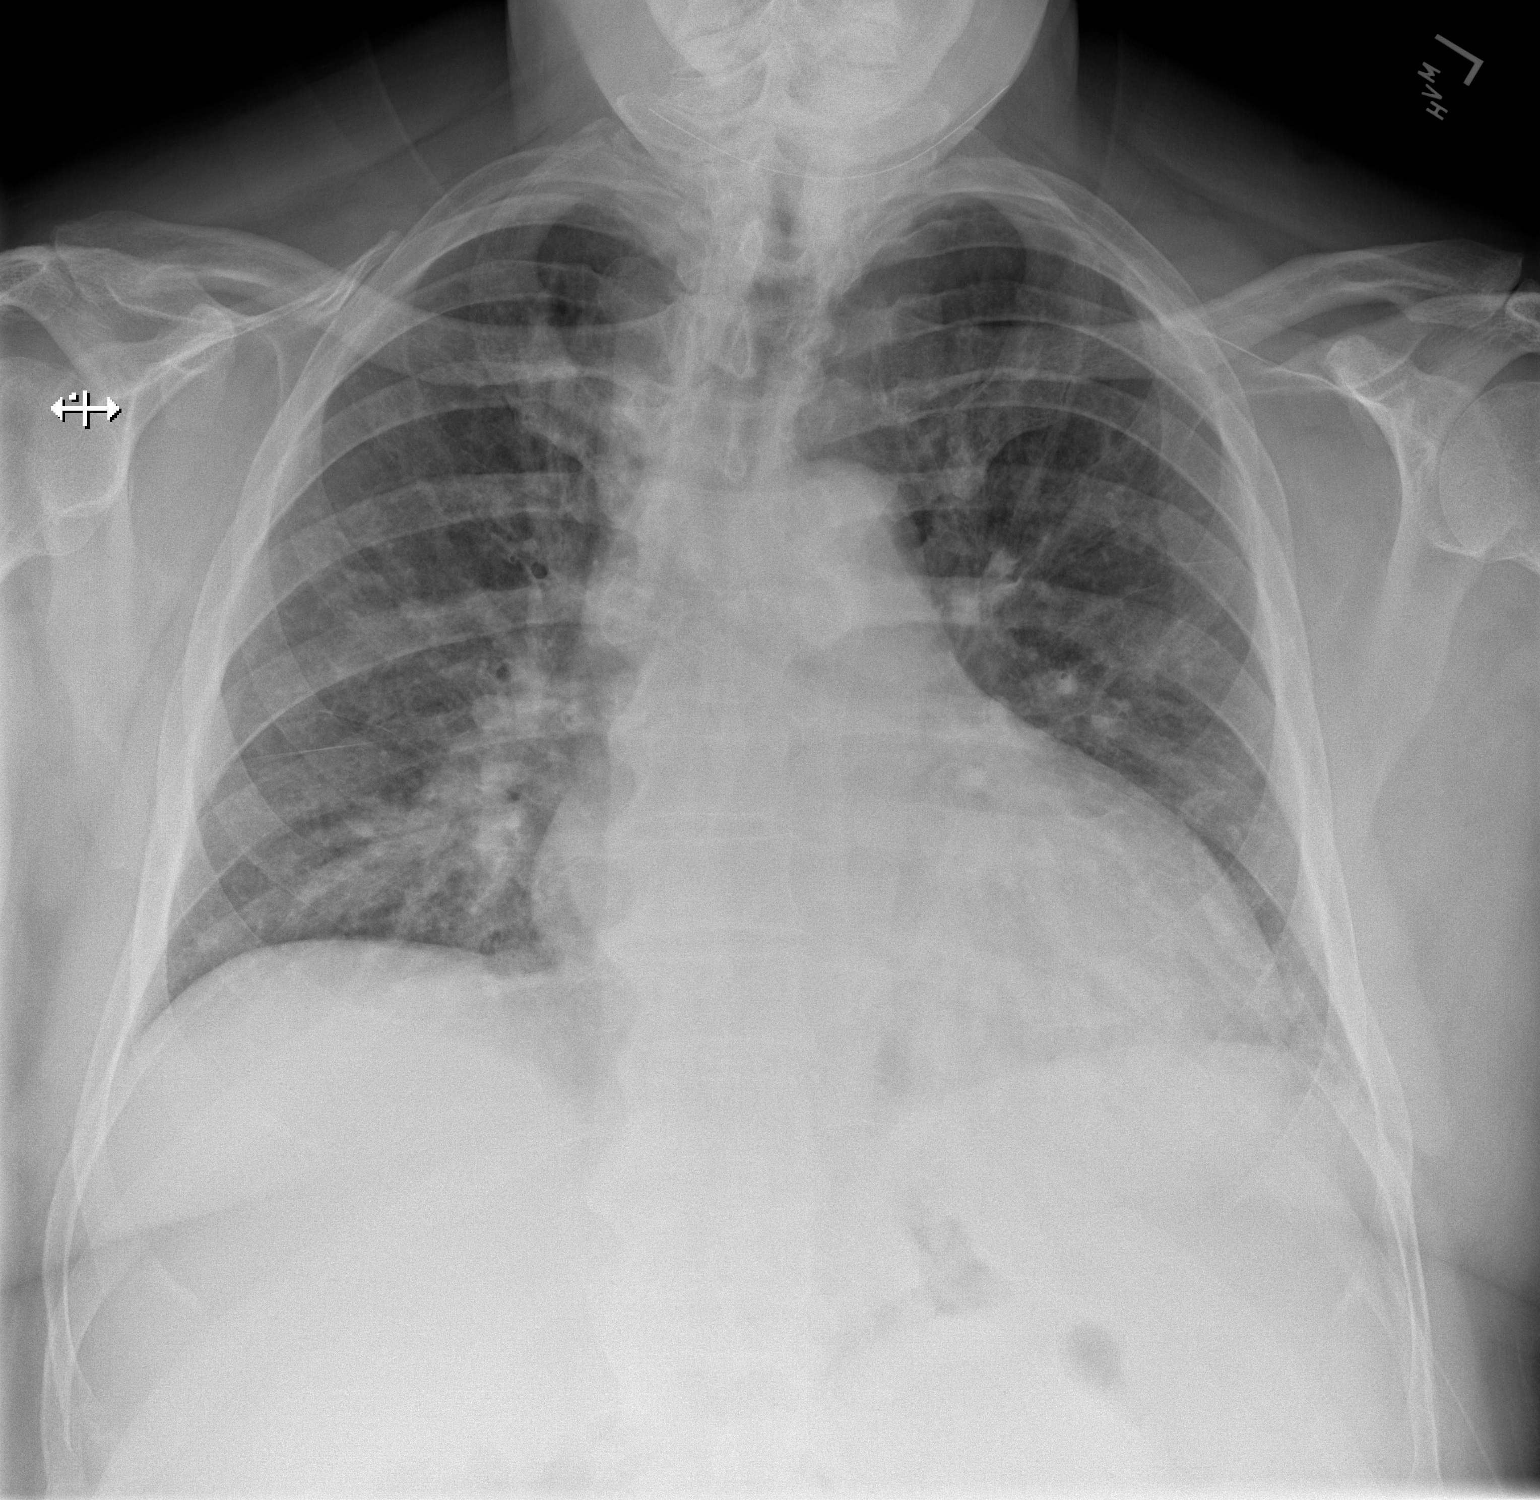

[w chest lat]
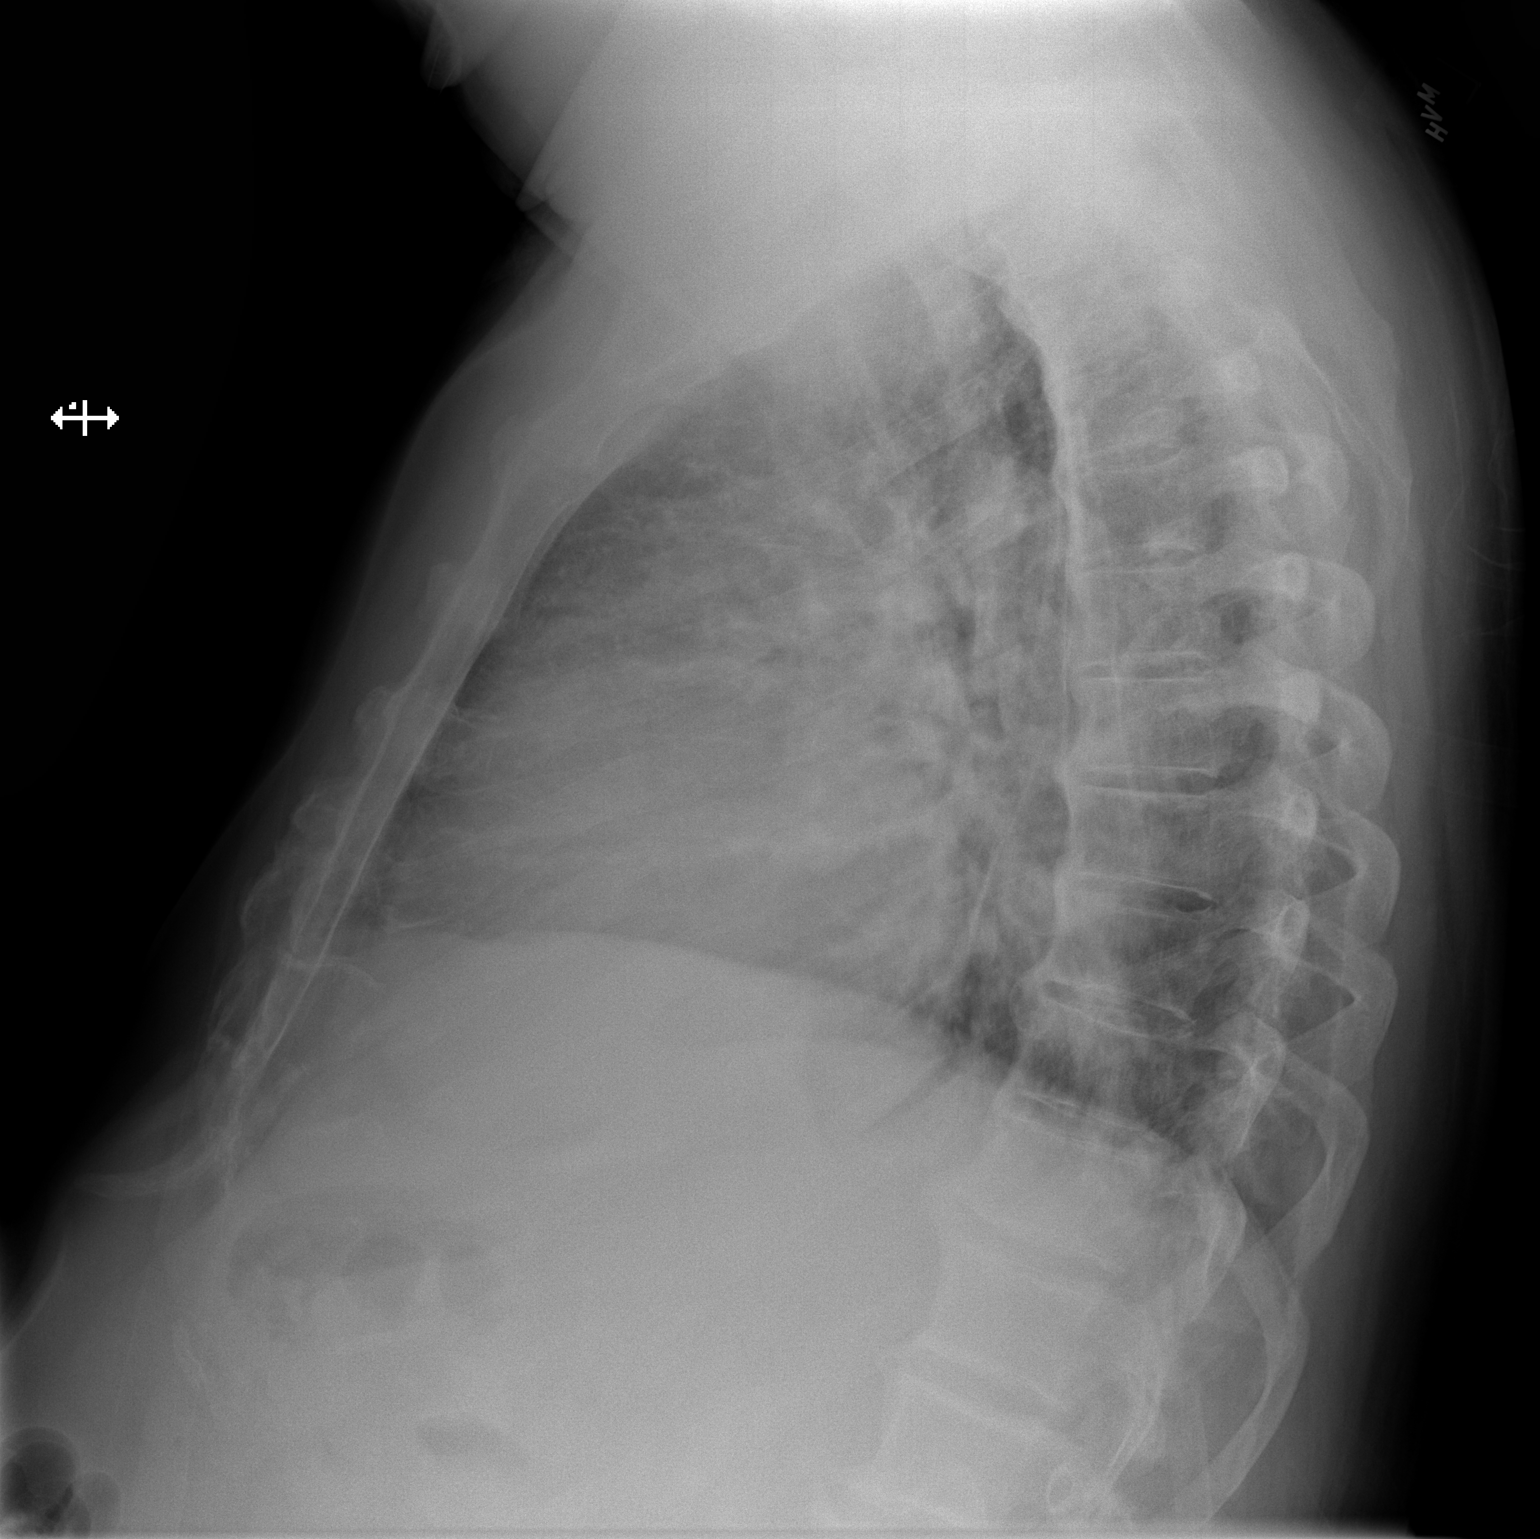

[2 of 2 positions shown; findings below may reference images not displayed]

FINDINGS: The heart size and mediastinal contours are within normal limits.
Both lungs are clear. The visualized skeletal structures are
unremarkable.
IMPRESSION: No active cardiopulmonary disease.

## 2020-07-30 IMAGING — CT CT HEAD W/O CM
3 of 4 series · 15 of 47 positions shown, 18 images · non-contrast
Comparison: [DATE], [DATE]

CLINICAL DATA: Neurologic deficit, worsening depression

EXAM:
CT HEAD WITHOUT CONTRAST
TECHNIQUE: Contiguous axial images were obtained from the base of the skull
through the vertex without intravenous contrast.

[Series 2: head wo · axial · 0.42mm/px · z∈[+1470,+1610]mm · 9 of 32 slices shown, 12 images]
[im 2/32  brain]
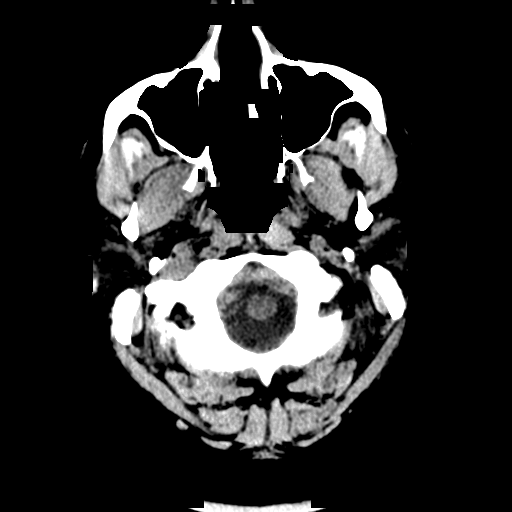
[im 2/32  bone]
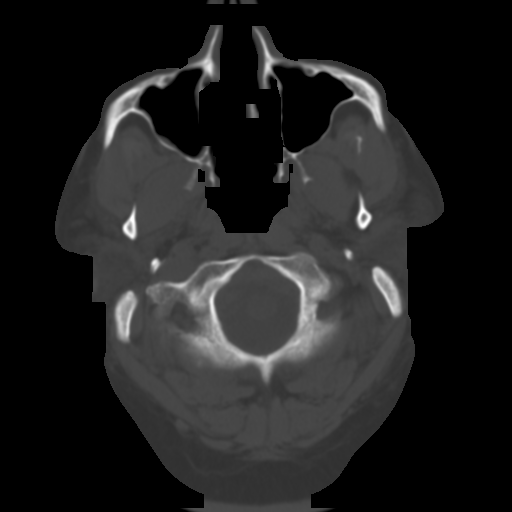
[im 6/32  brain]
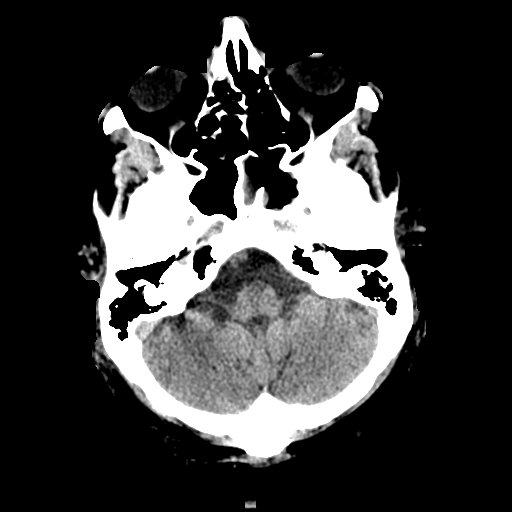
[im 10/32  brain]
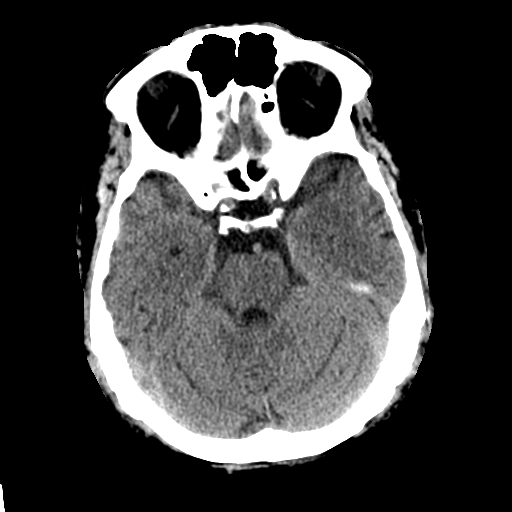
[im 12/32  brain]
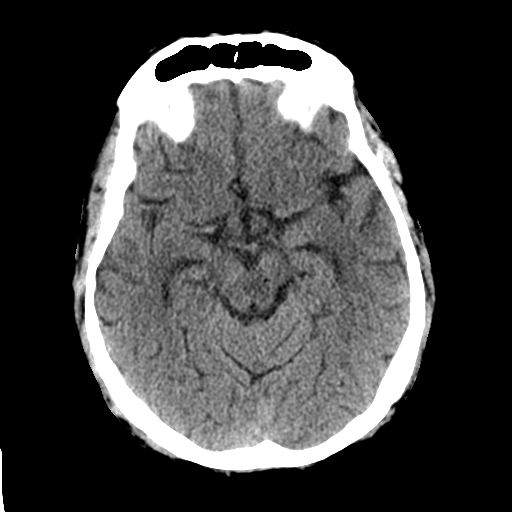
[im 16/32  brain]
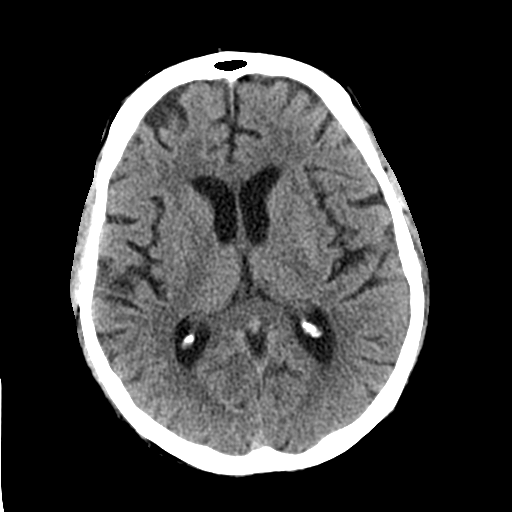
[im 16/32  bone]
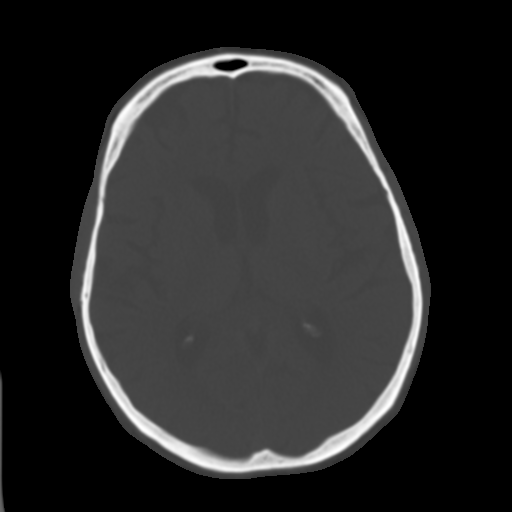
[im 20/32  brain]
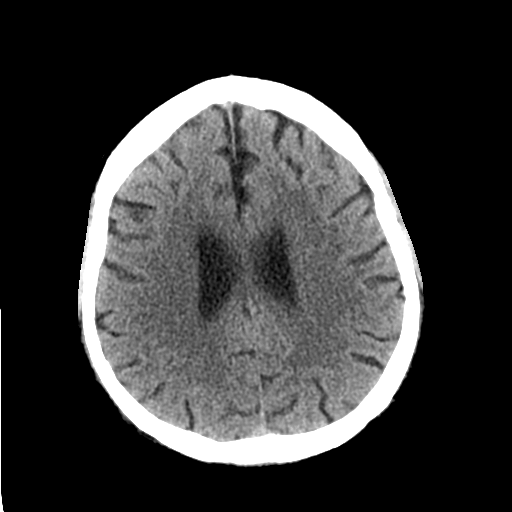
[im 22/32  brain]
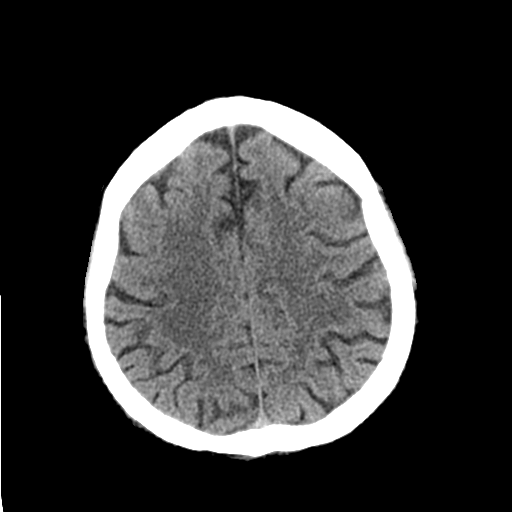
[im 26/32  brain]
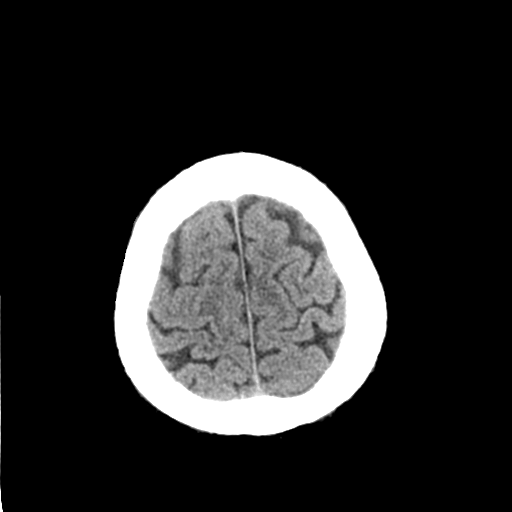
[im 30/32  brain]
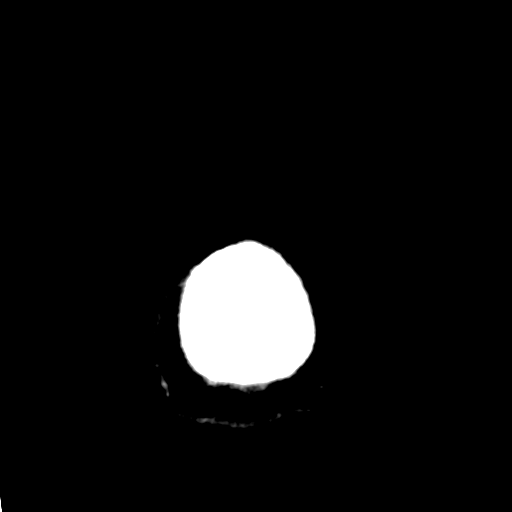
[im 30/32  bone]
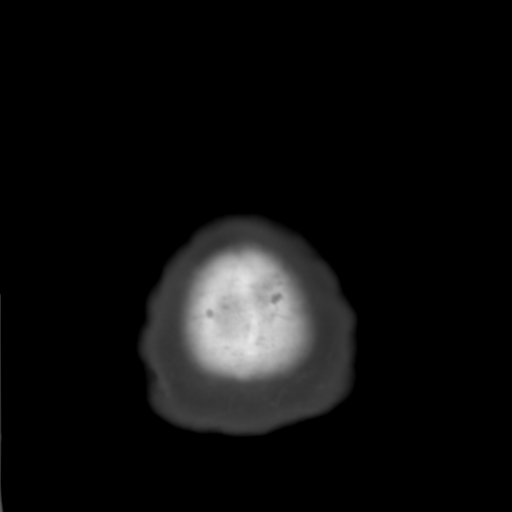

[Series 4: coronal soft tissue · coronal · 0.31mm/px · 3 of 75 slices shown]
[im 25/75  brain]
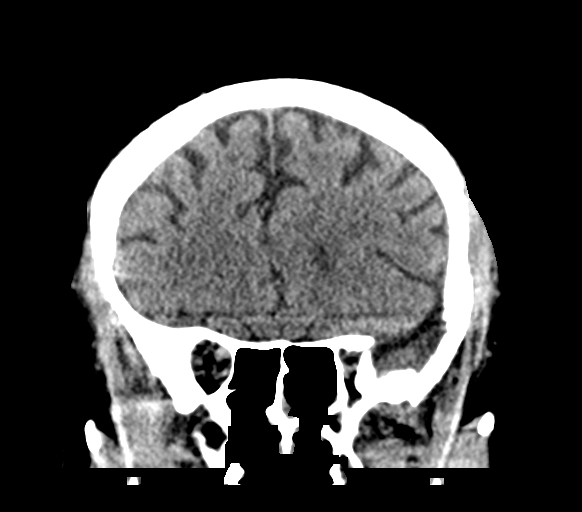
[im 33/75  brain]
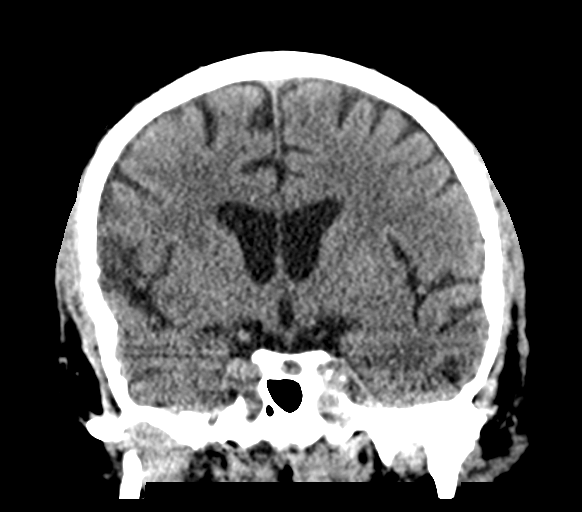
[im 42/75  brain]
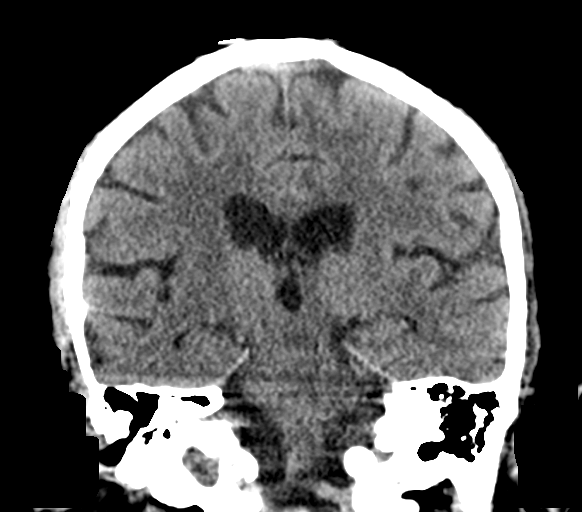

[Series 5: sagittal soft tissue · sagittal · 0.34mm/px · 3 of 61 slices shown]
[im 21/61  brain]
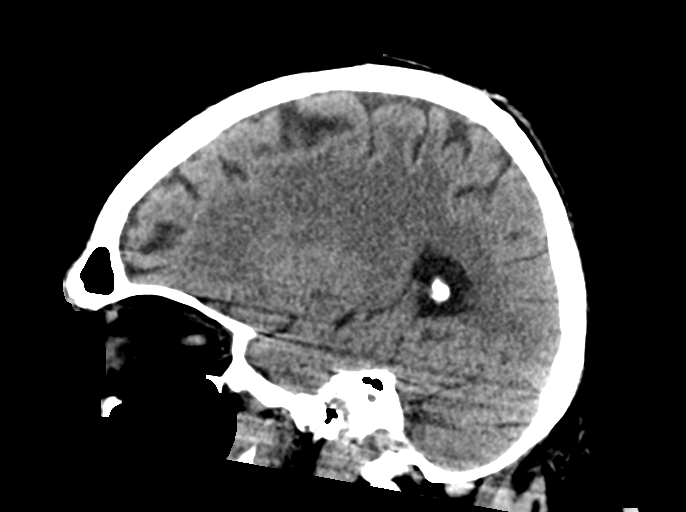
[im 31/61  brain]
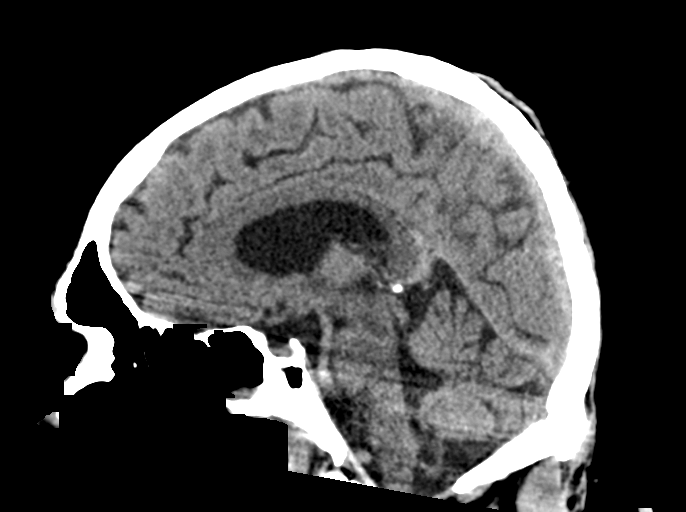
[im 41/61  brain]
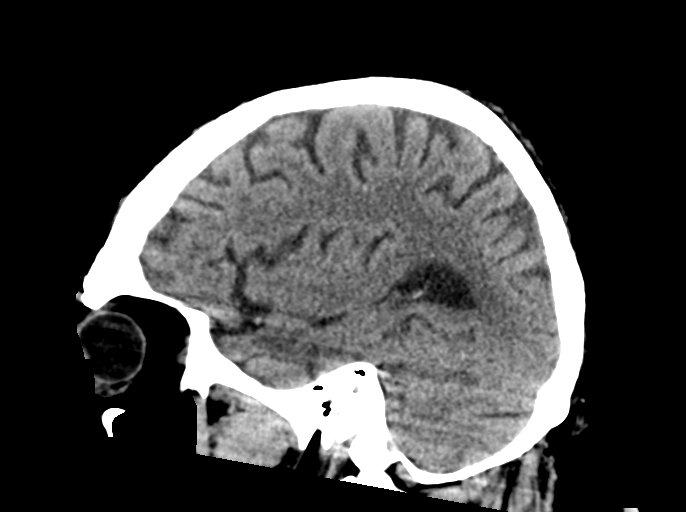

[15 of 47 positions shown; findings below may reference images not displayed]

FINDINGS: Brain: No acute infarct or hemorrhage. Lateral ventricles and
midline structures are unremarkable. No acute extra-axial fluid
collections. No mass effect.

Vascular: No hyperdense vessel or unexpected calcification.

Skull: Normal. Negative for fracture or focal lesion.

Sinuses/Orbits: Mild mucoperiosteal thickening of the right
maxillary and left sphenoid sinuses.

Other: None.
IMPRESSION: 1. No acute intracranial process.

## 2020-07-30 NOTE — ED Provider Notes (Signed)
[facility] DEPT Provider Note   CSN: [id number] Arrival date & time: 07/30/20  1927     History Chief Complaint  Patient presents with  . Depression    Matthew Barnes is a 65 y.o. male. Level 5 caveat due to difficulty speaking. HPI Patient here reportedly for mental status change.  Reportedly has been more depressed and more active.  However having difficulty speaking.  Difficulty getting in complete sentences and appears to be having a lot of word finding.  Reported this was going on for a while.  There is a office visit that mentions a complaint of memory loss but I cannot see the actual note.  Lab work appears to been ordered but had not been done.  Patient cannot really even complete a sentence most the time telling me what is going on.  States he has ups and downs.  History of bipolar disease.  Denies suicidal thoughts.  Patient is on Seroquel.    Past Medical History:  Diagnosis Date  . Bipolar II disorder - Managed by Marye Round [phone]) 05/03/2013  . Diabetes mellitus   . Dysphagia   . Hyperlipidemia   . Hypertension   . Unspecified hypothyroidism 05/03/2013    Patient Active Problem List   Diagnosis Date Noted  . Chest pain 03/26/2018  . Dyspnea on exertion 03/26/2018  . Family history of heart disease 03/26/2018  . Leg edema 10/27/2016  . Bipolar affective, mixed ([place]) 04/29/2016  . Esophageal reflux 01/17/2014  . Type 2 diabetes mellitus with diabetic neuropathy, with long-term current use of insulin ([place]) 08/31/2013  . Hypertension associated with diabetes ([place]) 05/03/2013  . Hyperlipidemia 05/03/2013    Past Surgical History:  Procedure Laterality Date  . KNEE SURGERY     scope on left knee       Family History  Problem Relation Age of Onset  . Diabetes Mother   . Hypertension Mother   . Heart disease Mother   . Heart disease Father   . Heart attack Father 54  . Diabetes Father   . Hypertension Father   .  Depression Father   . Diabetes Sister   . Depression Paternal Grandmother   . Depression Other     Social History   Tobacco Use  . Smoking status: Never Smoker  . Smokeless tobacco: Never Used  Substance Use Topics  . Alcohol use: No  . Drug use: No    Home Medications Prior to Admission medications   Medication Sig Start Date End Date Taking? Authorizing Provider  amLODipine (NORVASC) 10 MG tablet TAKE 1 TABLET BY MOUTH EVERY DAY 06/08/20   Elayne Snare, MD  Blood Pressure Monitoring (BLOOD PRESSURE CUFF) MISC Use as directed 09/02/18   Lucretia Kern, DO  gabapentin (NEURONTIN) 300 MG capsule TAKE 1 CAPSULE BY MOUTH THREE TIMES A DAY 06/08/20   Elayne Snare, MD  hydrochlorothiazide (HYDRODIURIL) 25 MG tablet TAKE 1/2 TABLET BY MOUTH EVERY DAY 03/07/20   Caren Macadam, MD  Insulin Syringe-Needle U-100 (INSULIN SYRINGE 1CC/31GX5/16") 31G X 5/16" 1 ML MISC Use 3 needles per day 01/31/14   Elayne Snare, MD  lithium carbonate 300 MG capsule Take 3 capsules (900 mg total) by mouth daily. 04/06/19   Koberlein, Steele Berg, MD  losartan (COZAAR) 100 MG tablet TAKE 1 TABLET BY MOUTH EVERY DAY 06/07/20   Koberlein, Andris Flurry C, MD  lovastatin (MEVACOR) 20 MG tablet TAKE 1 TABLET BY MOUTH EVERY DAY 06/07/20   Koberlein, Andris Flurry  C, MD  metFORMIN (GLUCOPHAGE) 1000 MG tablet TAKE 1 TABLET BY MOUTH 2 TIMES DAILY WITH A MEAL. 11/02/17   Elayne Snare, MD  NOVOLIN 70/30 (70-30) 100 UNIT/ML injection INJECT 40 UNITS UNDER THE SKIN BEFORE BREAKFAST AND 10 UNITS BEFORE SUPPER. 04/19/20   Elayne Snare, MD  [facility] VERIO test strip USE AS INSTRUCTED TO CHECK BLOOD SUGAR THREE TIMES A DAY. 10/15/17   Colin Benton R, DO  pantoprazole (PROTONIX) 20 MG tablet TAKE 1 TABLET BY MOUTH TWICE A DAY BEFORE MEALS 07/06/20   Billie Ruddy, MD  QUEtiapine (SEROQUEL) 100 MG tablet Take 400 mg by mouth at bedtime.     [provider]  sildenafil (VIAGRA) 50 MG tablet Take 1 tablet (50 mg total) by mouth daily as needed for  erectile dysfunction. Do not take more then once in 24 hours. 04/28/17   Lucretia Kern, DO    Allergies    Influenza vaccines  Review of Systems   Review of Systems  Unable to perform ROS: Mental status change  Gastrointestinal: Negative for abdominal pain.  Neurological: Positive for speech difficulty. Negative for headaches.    Physical Exam Updated Vital Signs BP (!) 142/96 (BP Location: Left Arm)   Pulse (!) 115   Temp 99.2 F (37.3 C) (Oral)   Resp 18   Ht 6\' 1"  (1.854 m)   Wt 132.2 kg   SpO2 97%   BMI 38.45 kg/m   Physical Exam Vitals and nursing note reviewed.  HENT:     Head: Normocephalic.     Mouth/Throat:     Mouth: Mucous membranes are moist.  Eyes:     Pupils: Pupils are equal, round, and reactive to light.  Cardiovascular:     Rate and Rhythm: Tachycardia present.  Pulmonary:     Effort: Pulmonary effort is normal.  Abdominal:     Tenderness: There is no abdominal tenderness.  Musculoskeletal:        General: No tenderness.     Cervical back: Neck supple.  Skin:    Capillary Refill: Capillary refill takes less than 2 seconds.  Neurological:     Mental Status: He is alert.     Comments: Patient is awake but is having a hard time getting his words out.  We will get a few words out and sometimes almost complete sentences but then appears to be struggling to find the words.  Moving all extremities but does appear to have a little bit of a resting tremor of the left upper extremity.  Psychiatric:        Mood and Affect: Mood normal.     ED Results / Procedures / Treatments   Labs (all labs ordered are listed, but only abnormal results are displayed) Labs Reviewed  COMPREHENSIVE METABOLIC PANEL - Abnormal; Notable for the following components:      Result Value   Glucose, Bld 207 (*)    All other components within normal limits  CBC WITH DIFFERENTIAL/PLATELET - Abnormal; Notable for the following components:   Hemoglobin 12.2 (*)    MCH 25.2 (*)     All other components within normal limits  RESP PANEL BY RT-PCR (FLU A&B, COVID) ARPGX2  ETHANOL  RAPID URINE DRUG SCREEN, HOSP PERFORMED  LITHIUM LEVEL  URINALYSIS, ROUTINE W REFLEX MICROSCOPIC    EKG EKG Interpretation  Date/Time:  Monday Jul 30 2020 21:35:35 EDT Ventricular Rate:  110 PR Interval:  152 QRS Duration: 96 QT Interval:  344 QTC Calculation: 465  R Axis:   11 Text Interpretation: Sinus tachycardia Possible Left atrial enlargement Nonspecific ST and T wave abnormality Abnormal ECG Confirmed by Davonna Belling [id number]) on 07/30/2020 11:36:49 PM   Radiology DG Chest 2 View  Result Date: 07/30/2020 CLINICAL DATA:  Cough EXAM: CHEST - 2 VIEW COMPARISON:  05/21/2020 FINDINGS: The heart size and mediastinal contours are within normal limits. Both lungs are clear. The visualized skeletal structures are unremarkable. IMPRESSION: No active cardiopulmonary disease. Electronically Signed   By: Ulyses Jarred M.D.   On: 07/30/2020 21:06   CT Head Wo Contrast  Result Date: 07/30/2020 CLINICAL DATA:  Neurologic deficit, worsening depression EXAM: CT HEAD WITHOUT CONTRAST TECHNIQUE: Contiguous axial images were obtained from the base of the skull through the vertex without intravenous contrast. COMPARISON:  07/13/2019, 05/13/2010 FINDINGS: Brain: No acute infarct or hemorrhage. Lateral ventricles and midline structures are unremarkable. No acute extra-axial fluid collections. No mass effect. Vascular: No hyperdense vessel or unexpected calcification. Skull: Normal. Negative for fracture or focal lesion. Sinuses/Orbits: Mild mucoperiosteal thickening of the right maxillary and left sphenoid sinuses. Other: None. IMPRESSION: 1. No acute intracranial process. Electronically Signed   By: Randa Ngo M.D.   On: 07/30/2020 23:26    Procedures Procedures   Medications Ordered in ED Medications - No data to display  ED Course  I have reviewed the triage vital signs and the nursing  notes.  Pertinent labs & imaging results that were available during my care of the patient were reviewed by me and considered in my medical decision making (see chart for details).    MDM Rules/Calculators/A&P                          Patient presents with some mood issues but also some mental status change.  Reported has been having more difficulty with memory.  And is having difficulty speaking and now.  Little bit of a tremor on the left hand.  Head CT reassuring.  Lab work also reassuring.  However I do not think this is necessarily a specific cause by his bipolar disorder.  With difficulty speaking I feels the patient would benefit from more of a neurologic work-up.  Will discuss with hospitalist.  Patient is not cleared medically for more psychiatric placement. Final Clinical Impression(s) / ED Diagnoses Final diagnoses:  Difficulty speaking  Bipolar 2 disorder [facility])    Rx / DC Orders ED Discharge Orders    None       Davonna Belling, MD 07/30/20 2339

## 2020-07-30 NOTE — H&P (Signed)
History and Physical    Matthew Barnes [id number] DOB: 1955/10/21 DOA: 07/30/2020  PCP: System, Provider Not In    Patient coming from:  Home   Chief Complaint:  Depression,Anxiety,malaise.   HPI: Matthew Barnes is a 65 y.o. male seen in ed with complaints of anxiety and malaise for few weeks.HPI is limited due to pt's difficulty with recall and word finding difficulty vs perseveration. EMS vitals:  141/102,118 HR,20 RR,95% O2 sat on room air. Pt in ed upon eval keep repeating one sentence when asked anything " well you know usually I came in up and down, usually up and down" When asked what kind of up and down feeling he has  He says " well its up and down" HPI is therefore limited. Pt is aphasic and  Pt has past medical history of depression/bipolar disorder managed by Dr. Quillian Quince [phone]./diabetes mellitus type 2/anxiety/hyperlipidemia, GERD, hypothyroidism, dysphagia. ED Course:  Vitals:   07/30/20 2045 07/30/20 2100 07/30/20 2150 07/30/20 2325  BP: (!) 155/96  (!) 175/114 (!) 142/96  Pulse: (!) 111 (!) 114 (!) 111 (!) 115  Resp: 19  18 18   Temp:   99.2 F (37.3 C)   TempSrc:   Oral   SpO2: 97% 97% 98% 97%  Weight:      Height:      In ed pt is alert,awake and afebrile, hypertensive,chest xray is negative for any active process. Ethanol level is less than 10, urine drug screen is negative, CMP shows a glucose of 207 otherwise normal, CBC shows hemoglobin of 12.2 otherwise normal, head CT without contrast done today shows no acute intracranial process.   Review of Systems:  Review of Systems  Unable to perform ROS: Mental status change     Past Medical History:  Diagnosis Date  . Bipolar II disorder - Managed by Marye Round [phone]) 05/03/2013  . Diabetes mellitus   . Dysphagia   . Hyperlipidemia   . Hypertension   . Unspecified hypothyroidism 05/03/2013    Past Surgical History:  Procedure Laterality Date  . KNEE SURGERY     scope on left  knee     reports that he has never smoked. He has never used smokeless tobacco. He reports that he does not drink alcohol and does not use drugs.  Allergies  Allergen Reactions  . Influenza Vaccines Swelling    Reports of swelling of the arm and then sick for 10 days    Family History  Problem Relation Age of Onset  . Diabetes Mother   . Hypertension Mother   . Heart disease Mother   . Heart disease Father   . Heart attack Father 42  . Diabetes Father   . Hypertension Father   . Depression Father   . Diabetes Sister   . Depression Paternal Grandmother   . Depression Other     Prior to Admission medications   Medication Sig Start Date End Date Taking? Authorizing Provider  amLODipine (NORVASC) 10 MG tablet TAKE 1 TABLET BY MOUTH EVERY DAY 06/08/20   Elayne Snare, MD  Blood Pressure Monitoring (BLOOD PRESSURE CUFF) MISC Use as directed 09/02/18   Lucretia Kern, DO  gabapentin (NEURONTIN) 300 MG capsule TAKE 1 CAPSULE BY MOUTH THREE TIMES A DAY 06/08/20   Elayne Snare, MD  hydrochlorothiazide (HYDRODIURIL) 25 MG tablet TAKE 1/2 TABLET BY MOUTH EVERY DAY 03/07/20   Caren Macadam, MD  Insulin Syringe-Needle U-100 (INSULIN SYRINGE 1CC/31GX5/16") 31G X 5/16" 1 ML MISC Use  3 needles per day 01/31/14   Elayne Snare, MD  lithium carbonate 300 MG capsule Take 3 capsules (900 mg total) by mouth daily. 04/06/19   Koberlein, Steele Berg, MD  losartan (COZAAR) 100 MG tablet TAKE 1 TABLET BY MOUTH EVERY DAY 06/07/20   Koberlein, Andris Flurry C, MD  lovastatin (MEVACOR) 20 MG tablet TAKE 1 TABLET BY MOUTH EVERY DAY 06/07/20   Koberlein, Andris Flurry C, MD  metFORMIN (GLUCOPHAGE) 1000 MG tablet TAKE 1 TABLET BY MOUTH 2 TIMES DAILY WITH A MEAL. 11/02/17   Elayne Snare, MD  NOVOLIN 70/30 (70-30) 100 UNIT/ML injection INJECT 40 UNITS UNDER THE SKIN BEFORE BREAKFAST AND 10 UNITS BEFORE SUPPER. 04/19/20   Elayne Snare, MD  [facility] VERIO test strip USE AS INSTRUCTED TO CHECK BLOOD SUGAR THREE TIMES A DAY. 10/15/17   Colin Benton  R, DO  pantoprazole (PROTONIX) 20 MG tablet TAKE 1 TABLET BY MOUTH TWICE A DAY BEFORE MEALS 07/06/20   Billie Ruddy, MD  QUEtiapine (SEROQUEL) 100 MG tablet Take 400 mg by mouth at bedtime.     [provider]  sildenafil (VIAGRA) 50 MG tablet Take 1 tablet (50 mg total) by mouth daily as needed for erectile dysfunction. Do not take more then once in 24 hours. 04/28/17   Lucretia Kern, DO    Physical Exam: Vitals:   07/30/20 2045 07/30/20 2100 07/30/20 2150 07/30/20 2325  BP: (!) 155/96  (!) 175/114 (!) 142/96  Pulse: (!) 111 (!) 114 (!) 111 (!) 115  Resp: 19  18 18   Temp:   99.2 F (37.3 C)   TempSrc:   Oral   SpO2: 97% 97% 98% 97%  Weight:      Height:       Physical Exam Vitals and nursing note reviewed.  Constitutional:      General: He is not in acute distress.    Appearance: Normal appearance. He is obese. He is not toxic-appearing or diaphoretic.  HENT:     Head: Normocephalic and atraumatic.     Right Ear: External ear normal.     Left Ear: External ear normal.     Nose: Nose normal.     Mouth/Throat:     Mouth: Mucous membranes are moist.  Eyes:     General:        Right eye: Discharge present.        Left eye: Discharge present. Cardiovascular:     Rate and Rhythm: Regular rhythm. Tachycardia present.     Heart sounds: Gallop present.   Pulmonary:     Effort: Pulmonary effort is normal.     Breath sounds: Examination of the right-lower field reveals wheezing. Examination of the left-lower field reveals wheezing. Wheezing present.  Abdominal:     General: Bowel sounds are normal. There is no distension.     Palpations: Abdomen is soft.     Tenderness: There is no abdominal tenderness. There is no guarding.  Musculoskeletal:     Right lower leg: 2+ Edema present.     Left lower leg: 2+ Edema present.  Neurological:     General: No focal deficit present.     Mental Status: He is alert.     Cranial Nerves: Cranial nerves are intact.     Motor: Motor  function is intact.  Psychiatric:        Attention and Perception: Attention normal.        Mood and Affect: Affect is blunt.  Speech: Speech normal.        Behavior: Behavior is slowed. Behavior is cooperative.     Labs on Admission: I have personally reviewed following labs and imaging studies  No results for input(s): CKTOTAL, CKMB, TROPONINI in the last 72 hours. Lab Results  Component Value Date   WBC 8.4 07/30/2020   HGB 12.2 (L) 07/30/2020   HCT 40.3 07/30/2020   MCV 83.1 07/30/2020   PLT 203 07/30/2020    Recent Labs  Lab 07/30/20 2101  NA 138  K 3.8  CL 106  CO2 23  BUN 14  CREATININE 1.24  CALCIUM 9.1  PROT 7.3  BILITOT 1.2  ALKPHOS 49  ALT 30  AST 33  GLUCOSE 207*   Lab Results  Component Value Date   CHOL 128 07/11/2019   HDL 32.60 (L) 07/11/2019   LDLCALC 68 07/11/2019   TRIG 137.0 07/11/2019   Urinalysis    Component Value Date/Time   COLORURINE YELLOW 07/30/2020 2101   APPEARANCEUR CLEAR 07/30/2020 2101   LABSPEC 1.014 07/30/2020 2101   PHURINE 6.0 07/30/2020 2101   GLUCOSEU NEGATIVE 07/30/2020 2101   GLUCOSEU 250 (A) 04/04/2016 0913   HGBUR MODERATE (A) 07/30/2020 2101   BILIRUBINUR NEGATIVE 07/30/2020 2101   BILIRUBINUR Negative 11/29/2019 1446   KETONESUR 5 (A) 07/30/2020 2101   PROTEINUR 100 (A) 07/30/2020 2101   UROBILINOGEN 0.2 11/29/2019 1446   UROBILINOGEN 0.2 04/04/2016 0913   NITRITE NEGATIVE 07/30/2020 2101   LEUKOCYTESUR NEGATIVE 07/30/2020 2101   COVID-19 Labs No results for input(s): DDIMER, FERRITIN, LDH, CRP in the last 72 hours.  Lab Results  Component Value Date   SARSCOV2NAA NEGATIVE 07/30/2020    Radiological Exams on Admission: DG Chest 2 View  Result Date: 07/30/2020 CLINICAL DATA:  Cough EXAM: CHEST - 2 VIEW COMPARISON:  05/21/2020 FINDINGS: The heart size and mediastinal contours are within normal limits. Both lungs are clear. The visualized skeletal structures are unremarkable. IMPRESSION: No active  cardiopulmonary disease. Electronically Signed   By: Ulyses Jarred M.D.   On: 07/30/2020 21:06   CT Head Wo Contrast  Result Date: 07/30/2020 CLINICAL DATA:  Neurologic deficit, worsening depression EXAM: CT HEAD WITHOUT CONTRAST TECHNIQUE: Contiguous axial images were obtained from the base of the skull through the vertex without intravenous contrast. COMPARISON:  07/13/2019, 05/13/2010 FINDINGS: Brain: No acute infarct or hemorrhage. Lateral ventricles and midline structures are unremarkable. No acute extra-axial fluid collections. No mass effect. Vascular: No hyperdense vessel or unexpected calcification. Skull: Normal. Negative for fracture or focal lesion. Sinuses/Orbits: Mild mucoperiosteal thickening of the right maxillary and left sphenoid sinuses. Other: None. IMPRESSION: 1. No acute intracranial process. Electronically Signed   By: Randa Ngo M.D.   On: 07/30/2020 23:26    EKG: Independently reviewed.  Sinus tach 110 and lvh, twi in avf.    Assessment/Plan Principal Problem:   Aphasia Active Problems:   Bipolar affective, mixed ([place])   Hypertension associated with diabetes ([place])   Hyperlipidemia   Type 2 diabetes mellitus with diabetic neuropathy, with long-term current use of insulin ([facility])   Esophageal reflux Aphasia: D/D include speech disorder due to underlying psychiatric d/o or CVA. We will obtain MRI of brain / Carotid usg and 1 d echo.  Cont asa and statin. Tele psych per am team.  Speech pathology.  Bipolar d/o: Pt cont on seroquel.  HTN; Home regimen of bp meds held to allow fro permissive htn. Hydralazine for sbp of 200 and above.   Hyperlipidemia:  copnt statin therapy.   DM II; Ssi/ levemir/ a1c/ accucheck and hypoglycemia protocol.  GERD: Iv ppi.  Gallop: We will obtain 2 d echo.  Sinus tachycardia : We will also obtain cta for pe protocol ? Pericardial effusion.  BL bacterial conjunctivitis: gatifloxacin eyedrops both eyes .  Wheezing  : Chest xray wnl. o2 sat on RA are normal.   DVT prophylaxis:  SCD's  Code Status:  Full Code   Family Communication:  [place], [place] (Mother)  [phone] (Home Phone)   Disposition Plan:  Home    Consults called:  None  Admission status: Inpatient.     Para Skeans MD [facility] [phone] How to contact the [facility] Attending or Consulting provider [place] or covering provider during after hours [place], for this patient.    1. Check the care team in [facility] and look for a) attending/consulting [place] provider listed and b) the [facility] team listed 2. Log into [email or web address] and use [place]'s universal password to access. If you do not have the password, please contact the hospital operator. 3. Locate the [facility] provider you are looking for under [facility] and page to a number that you can be directly reached. 4. If you still have difficulty reaching the provider, please page the [facility] (Director on Call) for the Hospitalists listed on amion for assistance. [email or web address] Password [facility] 07/31/2020, 12:55 AM

## 2020-07-30 NOTE — ED Triage Notes (Signed)
Pt states that over the last several weeks feeling more depressed and having a hard time performing ADL's and finding words. Denies any SI/HI or hallucinations

## 2020-07-30 NOTE — ED Notes (Signed)
Pt's sister called for an update, pt verbalized that it is ok to update her.

## 2020-07-30 NOTE — ED Triage Notes (Signed)
The patient presents with malaise and anxiety for a few weeks. Patient has not been taking his home medication.     EMS vitals:  141/102 118 HR 20 RR 95% O2 sat on room air

## 2020-07-30 NOTE — H&P (Incomplete)
History and Physical    Matthew Barnes OVA:[id number] DOB: 05/22/1955 DOA: 07/30/2020  PCP: System, Provider Not In    Patient coming from:  Home   Chief Complaint:  Depression,Anxiety,malaise.   HPI: Matthew Barnes is a 65 y.o. male seen in ed with complaints of anxiety and malaise for few weeks.HPI is limited due to pt's difficulty with recall and word finding difficulty vs perseveration. EMS vitals:  141/102,118 HR,20 RR,95% O2 sat on room air.     Pt has past medical history of depression/bipolar disorder managed by Dr. Quillian Quince [phone]./diabetes mellitus type 2/anxiety/hyperlipidemia, GERD, hypothyroidism, dysphagia.     ED Course:  Vitals:   07/30/20 2045 07/30/20 2100 07/30/20 2150 07/30/20 2325  BP: (!) 155/96  (!) 175/114 (!) 142/96  Pulse: (!) 111 (!) 114 (!) 111 (!) 115  Resp: 19  18 18   Temp:   99.2 F (37.3 C)   TempSrc:   Oral   SpO2: 97% 97% 98% 97%  Weight:      Height:      In ed pt is alert,awake and afebrile, hypertensive,chest xray is negative for any active process. Ethanol level is less than 10, urine drug screen is negative, CMP shows a glucose of 207 otherwise normal, CBC shows hemoglobin of 12.2 otherwise normal, head CT without contrast done today shows no acute intracranial process.   Review of Systems:  ROS   Past Medical History:  Diagnosis Date  . Bipolar II disorder - Managed by Marye Round [phone]) 05/03/2013  . Diabetes mellitus   . Dysphagia   . Hyperlipidemia   . Hypertension   . Unspecified hypothyroidism 05/03/2013    Past Surgical History:  Procedure Laterality Date  . KNEE SURGERY     scope on left knee     reports that he has never smoked. He has never used smokeless tobacco. He reports that he does not drink alcohol and does not use drugs.  Allergies  Allergen Reactions  . Influenza Vaccines Swelling    Reports of swelling of the arm and then sick for 10 days    Family History  Problem Relation  Age of Onset  . Diabetes Mother   . Hypertension Mother   . Heart disease Mother   . Heart disease Father   . Heart attack Father 83  . Diabetes Father   . Hypertension Father   . Depression Father   . Diabetes Sister   . Depression Paternal Grandmother   . Depression Other     Prior to Admission medications   Medication Sig Start Date End Date Taking? Authorizing Provider  amLODipine (NORVASC) 10 MG tablet TAKE 1 TABLET BY MOUTH EVERY DAY 06/08/20   Elayne Snare, MD  Blood Pressure Monitoring (BLOOD PRESSURE CUFF) MISC Use as directed 09/02/18   Lucretia Kern, DO  gabapentin (NEURONTIN) 300 MG capsule TAKE 1 CAPSULE BY MOUTH THREE TIMES A DAY 06/08/20   Elayne Snare, MD  hydrochlorothiazide (HYDRODIURIL) 25 MG tablet TAKE 1/2 TABLET BY MOUTH EVERY DAY 03/07/20   Caren Macadam, MD  Insulin Syringe-Needle U-100 (INSULIN SYRINGE 1CC/31GX5/16") 31G X 5/16" 1 ML MISC Use 3 needles per day 01/31/14   Elayne Snare, MD  lithium carbonate 300 MG capsule Take 3 capsules (900 mg total) by mouth daily. 04/06/19   Koberlein, Steele Berg, MD  losartan (COZAAR) 100 MG tablet TAKE 1 TABLET BY MOUTH EVERY DAY 06/07/20   Koberlein, Andris Flurry C, MD  lovastatin (MEVACOR) 20 MG tablet TAKE 1  TABLET BY MOUTH EVERY DAY 06/07/20   Koberlein, Andris Flurry C, MD  metFORMIN (GLUCOPHAGE) 1000 MG tablet TAKE 1 TABLET BY MOUTH 2 TIMES DAILY WITH A MEAL. 11/02/17   Elayne Snare, MD  NOVOLIN 70/30 (70-30) 100 UNIT/ML injection INJECT 40 UNITS UNDER THE SKIN BEFORE BREAKFAST AND 10 UNITS BEFORE SUPPER. 04/19/20   Elayne Snare, MD  [facility] VERIO test strip USE AS INSTRUCTED TO CHECK BLOOD SUGAR THREE TIMES A DAY. 10/15/17   Colin Benton R, DO  pantoprazole (PROTONIX) 20 MG tablet TAKE 1 TABLET BY MOUTH TWICE A DAY BEFORE MEALS 07/06/20   Billie Ruddy, MD  QUEtiapine (SEROQUEL) 100 MG tablet Take 400 mg by mouth at bedtime.     [provider]  sildenafil (VIAGRA) 50 MG tablet Take 1 tablet (50 mg total) by mouth daily as needed for  erectile dysfunction. Do not take more then once in 24 hours. 04/28/17   Lucretia Kern, DO    Physical Exam: Vitals:   07/30/20 2045 07/30/20 2100 07/30/20 2150 07/30/20 2325  BP: (!) 155/96  (!) 175/114 (!) 142/96  Pulse: (!) 111 (!) 114 (!) 111 (!) 115  Resp: 19  18 18   Temp:   99.2 F (37.3 C)   TempSrc:   Oral   SpO2: 97% 97% 98% 97%  Weight:      Height:       Physical Exam   Labs on Admission: I have personally reviewed following labs and imaging studies  No results for input(s): CKTOTAL, CKMB, TROPONINI in the last 72 hours. Lab Results  Component Value Date   WBC 8.4 07/30/2020   HGB 12.2 (L) 07/30/2020   HCT 40.3 07/30/2020   MCV 83.1 07/30/2020   PLT 203 07/30/2020    Recent Labs  Lab 07/30/20 2101  NA 138  K 3.8  CL 106  CO2 23  BUN 14  CREATININE 1.24  CALCIUM 9.1  PROT 7.3  BILITOT 1.2  ALKPHOS 49  ALT 30  AST 33  GLUCOSE 207*   Lab Results  Component Value Date   CHOL 128 07/11/2019   HDL 32.60 (L) 07/11/2019   LDLCALC 68 07/11/2019   TRIG 137.0 07/11/2019   No results found for: DDIMER Invalid input(s): POCBNP  Urinalysis    Component Value Date/Time   COLORURINE YELLOW 04/04/2016 0913   APPEARANCEUR CLEAR 04/04/2016 0913   LABSPEC 1.020 04/04/2016 0913   PHURINE 6.0 04/04/2016 0913   GLUCOSEU 250 (A) 04/04/2016 0913   HGBUR NEGATIVE 04/04/2016 0913   BILIRUBINUR Negative 11/29/2019 1446   KETONESUR NEGATIVE 04/04/2016 0913   PROTEINUR Negative 11/29/2019 1446   PROTEINUR NEGATIVE 04/18/2011 2038   UROBILINOGEN 0.2 11/29/2019 1446   UROBILINOGEN 0.2 04/04/2016 0913   NITRITE Negative 11/29/2019 1446   NITRITE NEGATIVE 04/04/2016 0913   LEUKOCYTESUR Negative 11/29/2019 1446        COVID-19 Labs  No results for input(s): DDIMER, FERRITIN, LDH, CRP in the last 72 hours.  No results found for: SARSCOV2NAA  Radiological Exams on Admission: DG Chest 2 View  Result Date: 07/30/2020 CLINICAL DATA:  Cough EXAM: CHEST - 2  VIEW COMPARISON:  05/21/2020 FINDINGS: The heart size and mediastinal contours are within normal limits. Both lungs are clear. The visualized skeletal structures are unremarkable. IMPRESSION: No active cardiopulmonary disease. Electronically Signed   By: Ulyses Jarred M.D.   On: 07/30/2020 21:06   CT Head Wo Contrast  Result Date: 07/30/2020 CLINICAL DATA:  Neurologic deficit, worsening depression EXAM:  CT HEAD WITHOUT CONTRAST TECHNIQUE: Contiguous axial images were obtained from the base of the skull through the vertex without intravenous contrast. COMPARISON:  07/13/2019, 05/13/2010 FINDINGS: Brain: No acute infarct or hemorrhage. Lateral ventricles and midline structures are unremarkable. No acute extra-axial fluid collections. No mass effect. Vascular: No hyperdense vessel or unexpected calcification. Skull: Normal. Negative for fracture or focal lesion. Sinuses/Orbits: Mild mucoperiosteal thickening of the right maxillary and left sphenoid sinuses. Other: None. IMPRESSION: 1. No acute intracranial process. Electronically Signed   By: Randa Ngo M.D.   On: 07/30/2020 23:26    EKG: Independently reviewed.  Sinus tach 110 and lvh, twi in avf.    Assessment/Plan Active Problems:   Hypertension associated with diabetes ([place])   Hyperlipidemia   Type 2 diabetes mellitus with diabetic neuropathy, with long-term current use of insulin (HCC)   Esophageal reflux   Bipolar affective, mixed ([place])   Difficulty with speech      DVT prophylaxis:  SCD's  Code Status:  Full Code   Family Communication:  Dontreal, Miera (Mother)  [phone] (Home Phone)   Disposition Plan:  Home    Consults called:  None  Admission status: Inpatient.     Para Skeans MD [facility] [phone] How to contact the [facility] Attending or Consulting provider [place] or covering provider during after hours [place], for this patient.    1. Check the care team in [facility] and look for a) attending/consulting  [place] provider listed and b) the [facility] team listed 2. Log into [email or web address] and use [place]'s universal password to access. If you do not have the password, please contact the hospital operator. 3. Locate the [facility] provider you are looking for under [facility] and page to a number that you can be directly reached. 4. If you still have difficulty reaching the provider, please page the [facility] (Director on Call) for the Hospitalists listed on amion for assistance. [email or web address] Password [facility] 07/30/2020, 11:56 PM

## 2020-07-30 NOTE — ED Triage Notes (Signed)
Emergency Medicine Provider Triage Evaluation Note  GEOVANIE WINNETT , a 65 y.o. male  was evaluated in triage.  Pt complains of depression, gradual decline in ability to perform ADLs. Also cough.  Review of Systems  Positive: Cough, depression Negative: SI, HI  Physical Exam  BP (!) 152/96   Pulse (!) 109   Temp 98.8 F (37.1 C) (Oral)   Resp 16   Ht 6\' 1"  (1.854 m)   Wt 132.2 kg   SpO2 98%   BMI 38.45 kg/m  Gen:   Awake, no distress   HEENT:  Atraumatic  Resp:  Normal effort  Cardiac:  Normal rate  Abd:   Nondistended, nontender  MSK:   Moves extremities without difficulty  Neuro:  Speech clear   Medical Decision Making  Medically screening exam initiated at 9:00 PM.  Appropriate orders placed.  [name] was informed that the remainder of the evaluation will be completed by another provider, this initial triage assessment does not replace that evaluation, and the importance of remaining in the ED until their evaluation is complete.  Clinical Impression     Roque Lias 07/30/20 2101

## 2020-07-31 ENCOUNTER — Inpatient Hospital Stay (HOSPITAL_COMMUNITY): Payer: Medicare HMO

## 2020-07-31 DIAGNOSIS — Z833 Family history of diabetes mellitus: Secondary | ICD-10-CM | POA: Diagnosis not present

## 2020-07-31 DIAGNOSIS — R4701 Aphasia: Secondary | ICD-10-CM | POA: Diagnosis present

## 2020-07-31 DIAGNOSIS — E785 Hyperlipidemia, unspecified: Secondary | ICD-10-CM | POA: Diagnosis present

## 2020-07-31 DIAGNOSIS — R4182 Altered mental status, unspecified: Secondary | ICD-10-CM | POA: Diagnosis present

## 2020-07-31 DIAGNOSIS — I501 Left ventricular failure: Secondary | ICD-10-CM | POA: Diagnosis not present

## 2020-07-31 DIAGNOSIS — G9340 Encephalopathy, unspecified: Secondary | ICD-10-CM | POA: Diagnosis present

## 2020-07-31 DIAGNOSIS — E039 Hypothyroidism, unspecified: Secondary | ICD-10-CM | POA: Diagnosis present

## 2020-07-31 DIAGNOSIS — G319 Degenerative disease of nervous system, unspecified: Secondary | ICD-10-CM | POA: Diagnosis not present

## 2020-07-31 DIAGNOSIS — N179 Acute kidney failure, unspecified: Secondary | ICD-10-CM | POA: Diagnosis present

## 2020-07-31 DIAGNOSIS — K219 Gastro-esophageal reflux disease without esophagitis: Secondary | ICD-10-CM | POA: Diagnosis present

## 2020-07-31 DIAGNOSIS — Z794 Long term (current) use of insulin: Secondary | ICD-10-CM | POA: Diagnosis not present

## 2020-07-31 DIAGNOSIS — Z7984 Long term (current) use of oral hypoglycemic drugs: Secondary | ICD-10-CM | POA: Diagnosis not present

## 2020-07-31 DIAGNOSIS — H1089 Other conjunctivitis: Secondary | ICD-10-CM | POA: Diagnosis present

## 2020-07-31 DIAGNOSIS — I10 Essential (primary) hypertension: Secondary | ICD-10-CM | POA: Diagnosis not present

## 2020-07-31 DIAGNOSIS — E7800 Pure hypercholesterolemia, unspecified: Secondary | ICD-10-CM | POA: Diagnosis not present

## 2020-07-31 DIAGNOSIS — Z79899 Other long term (current) drug therapy: Secondary | ICD-10-CM | POA: Diagnosis not present

## 2020-07-31 DIAGNOSIS — I340 Nonrheumatic mitral (valve) insufficiency: Secondary | ICD-10-CM | POA: Diagnosis not present

## 2020-07-31 DIAGNOSIS — R479 Unspecified speech disturbances: Secondary | ICD-10-CM | POA: Diagnosis not present

## 2020-07-31 DIAGNOSIS — E1159 Type 2 diabetes mellitus with other circulatory complications: Secondary | ICD-10-CM | POA: Diagnosis not present

## 2020-07-31 DIAGNOSIS — I152 Hypertension secondary to endocrine disorders: Secondary | ICD-10-CM | POA: Diagnosis not present

## 2020-07-31 DIAGNOSIS — N1831 Chronic kidney disease, stage 3a: Secondary | ICD-10-CM | POA: Diagnosis present

## 2020-07-31 DIAGNOSIS — J019 Acute sinusitis, unspecified: Secondary | ICD-10-CM | POA: Diagnosis not present

## 2020-07-31 DIAGNOSIS — R059 Cough, unspecified: Secondary | ICD-10-CM | POA: Diagnosis not present

## 2020-07-31 DIAGNOSIS — R1310 Dysphagia, unspecified: Secondary | ICD-10-CM | POA: Diagnosis present

## 2020-07-31 DIAGNOSIS — R079 Chest pain, unspecified: Secondary | ICD-10-CM | POA: Diagnosis not present

## 2020-07-31 DIAGNOSIS — R0602 Shortness of breath: Secondary | ICD-10-CM | POA: Diagnosis not present

## 2020-07-31 DIAGNOSIS — R778 Other specified abnormalities of plasma proteins: Secondary | ICD-10-CM | POA: Diagnosis not present

## 2020-07-31 DIAGNOSIS — I130 Hypertensive heart and chronic kidney disease with heart failure and stage 1 through stage 4 chronic kidney disease, or unspecified chronic kidney disease: Secondary | ICD-10-CM | POA: Diagnosis present

## 2020-07-31 DIAGNOSIS — F3181 Bipolar II disorder: Secondary | ICD-10-CM | POA: Diagnosis present

## 2020-07-31 DIAGNOSIS — I5023 Acute on chronic systolic (congestive) heart failure: Secondary | ICD-10-CM | POA: Diagnosis not present

## 2020-07-31 DIAGNOSIS — I420 Dilated cardiomyopathy: Secondary | ICD-10-CM | POA: Diagnosis not present

## 2020-07-31 DIAGNOSIS — E1122 Type 2 diabetes mellitus with diabetic chronic kidney disease: Secondary | ICD-10-CM | POA: Diagnosis present

## 2020-07-31 DIAGNOSIS — E1169 Type 2 diabetes mellitus with other specified complication: Secondary | ICD-10-CM | POA: Diagnosis present

## 2020-07-31 DIAGNOSIS — F3160 Bipolar disorder, current episode mixed, unspecified: Secondary | ICD-10-CM | POA: Diagnosis present

## 2020-07-31 DIAGNOSIS — E1140 Type 2 diabetes mellitus with diabetic neuropathy, unspecified: Secondary | ICD-10-CM | POA: Diagnosis present

## 2020-07-31 DIAGNOSIS — I5043 Acute on chronic combined systolic (congestive) and diastolic (congestive) heart failure: Secondary | ICD-10-CM | POA: Diagnosis present

## 2020-07-31 DIAGNOSIS — Z20822 Contact with and (suspected) exposure to covid-19: Secondary | ICD-10-CM | POA: Diagnosis present

## 2020-07-31 DIAGNOSIS — I209 Angina pectoris, unspecified: Secondary | ICD-10-CM | POA: Diagnosis not present

## 2020-07-31 DIAGNOSIS — I517 Cardiomegaly: Secondary | ICD-10-CM | POA: Diagnosis not present

## 2020-07-31 DIAGNOSIS — E1165 Type 2 diabetes mellitus with hyperglycemia: Secondary | ICD-10-CM | POA: Diagnosis present

## 2020-07-31 LAB — PHOSPHORUS
Phosphorus: 3.5 mg/dL (ref 2.5–4.6)
Phosphorus: 3.5 mg/dL (ref 2.5–4.6)

## 2020-07-31 LAB — URINALYSIS, ROUTINE W REFLEX MICROSCOPIC
Bacteria, UA: NONE SEEN
Bilirubin Urine: NEGATIVE
Glucose, UA: NEGATIVE mg/dL
Ketones, ur: 5 mg/dL — AB
Leukocytes,Ua: NEGATIVE
Nitrite: NEGATIVE
Protein, ur: 100 mg/dL — AB
Specific Gravity, Urine: 1.014 (ref 1.005–1.030)
pH: 6 (ref 5.0–8.0)

## 2020-07-31 LAB — MAGNESIUM
Magnesium: 1.9 mg/dL (ref 1.7–2.4)
Magnesium: 2.1 mg/dL (ref 1.7–2.4)

## 2020-07-31 LAB — COMPREHENSIVE METABOLIC PANEL
ALT: 33 U/L (ref 0–44)
AST: 35 U/L (ref 15–41)
Albumin: 4.1 g/dL (ref 3.5–5.0)
Alkaline Phosphatase: 50 U/L (ref 38–126)
Anion gap: 12 (ref 5–15)
BUN: 16 mg/dL (ref 8–23)
CO2: 24 mmol/L (ref 22–32)
Calcium: 9.6 mg/dL (ref 8.9–10.3)
Chloride: 107 mmol/L (ref 98–111)
Creatinine, Ser: 1.18 mg/dL (ref 0.61–1.24)
GFR, Estimated: >60 mL/min (ref >60)
Glucose, Bld: 241 mg/dL — ABNORMAL HIGH (ref 70–99)
Potassium: 3.9 mmol/L (ref 3.5–5.1)
Sodium: 143 mmol/L (ref 135–145)
Total Bilirubin: 1 mg/dL (ref 0.3–1.2)
Total Protein: 7.2 g/dL (ref 6.5–8.1)

## 2020-07-31 LAB — CBC
HCT: 40.7 % (ref 39.0–52.0)
Hemoglobin: 12.5 g/dL — ABNORMAL LOW (ref 13.0–17.0)
MCH: 25.4 pg — ABNORMAL LOW (ref 26.0–34.0)
MCHC: 30.7 g/dL (ref 30.0–36.0)
MCV: 82.7 fL (ref 80.0–100.0)
Platelets: 224 10*3/uL (ref 150–400)
RBC: 4.92 MIL/uL (ref 4.22–5.81)
RDW: 14.3 % (ref 11.5–15.5)
WBC: 8.8 10*3/uL (ref 4.0–10.5)
nRBC: 0 % (ref 0.0–0.2)

## 2020-07-31 LAB — AMMONIA: Ammonia: 29 umol/L (ref 9–35)

## 2020-07-31 LAB — LIPID PANEL
Cholesterol: 116 mg/dL (ref 0–200)
HDL: 24 mg/dL — ABNORMAL LOW (ref >40)
LDL Cholesterol: 79 mg/dL (ref 0–99)
Total CHOL/HDL Ratio: 4.8 RATIO
Triglycerides: 65 mg/dL (ref <150)
VLDL: 13 mg/dL (ref 0–40)

## 2020-07-31 LAB — C-REACTIVE PROTEIN: CRP: 17.2 mg/dL — ABNORMAL HIGH (ref <1.0)

## 2020-07-31 LAB — GLUCOSE, CAPILLARY
Glucose-Capillary: 144 mg/dL — ABNORMAL HIGH (ref 70–99)
Glucose-Capillary: 152 mg/dL — ABNORMAL HIGH (ref 70–99)

## 2020-07-31 LAB — RPR: RPR Ser Ql: NONREACTIVE

## 2020-07-31 LAB — LACTIC ACID, PLASMA
Lactic Acid, Venous: 1.4 mmol/L (ref 0.5–1.9)
Lactic Acid, Venous: 1.4 mmol/L (ref 0.5–1.9)

## 2020-07-31 LAB — TROPONIN I (HIGH SENSITIVITY)
Troponin I (High Sensitivity): 32 ng/L — ABNORMAL HIGH (ref <18)
Troponin I (High Sensitivity): 33 ng/L — ABNORMAL HIGH (ref <18)
Troponin I (High Sensitivity): 34 ng/L — ABNORMAL HIGH (ref <18)

## 2020-07-31 LAB — D-DIMER, QUANTITATIVE: D-Dimer, Quant: 1.19 ug/mL-FEU — ABNORMAL HIGH (ref 0.00–0.50)

## 2020-07-31 LAB — CBG MONITORING, ED
Glucose-Capillary: 231 mg/dL — ABNORMAL HIGH (ref 70–99)
Glucose-Capillary: 228 mg/dL — ABNORMAL HIGH (ref 70–99)
Glucose-Capillary: 231 mg/dL — ABNORMAL HIGH (ref 70–99)
Glucose-Capillary: 175 mg/dL — ABNORMAL HIGH (ref 70–99)

## 2020-07-31 LAB — HEMOGLOBIN A1C
Hgb A1c MFr Bld: 8.3 % — ABNORMAL HIGH (ref 4.8–5.6)
Mean Plasma Glucose: 191.51 mg/dL

## 2020-07-31 LAB — LITHIUM LEVEL: Lithium Lvl: 0.37 mmol/L — ABNORMAL LOW (ref 0.60–1.20)

## 2020-07-31 LAB — SEDIMENTATION RATE: Sed Rate: 26 mm/hr — ABNORMAL HIGH (ref 0–16)

## 2020-07-31 LAB — HIV ANTIBODY (ROUTINE TESTING W REFLEX): HIV Screen 4th Generation wRfx: NONREACTIVE

## 2020-07-31 LAB — PROCALCITONIN: Procalcitonin: <0.1 ng/mL

## 2020-07-31 LAB — CK
Total CK: 404 U/L — ABNORMAL HIGH (ref 49–397)
Total CK: 417 U/L — ABNORMAL HIGH (ref 49–397)

## 2020-07-31 LAB — VITAMIN B12: Vitamin B-12: 397 pg/mL (ref 180–914)

## 2020-07-31 LAB — BRAIN NATRIURETIC PEPTIDE: B Natriuretic Peptide: 372.3 pg/mL — ABNORMAL HIGH (ref 0.0–100.0)

## 2020-07-31 IMAGING — CT CT ANGIO CHEST
3 of 7 series · 17 of 36 positions shown · IV contrast (omnipaque)
Comparison: None.

CLINICAL DATA: Cough.

EXAM:
CT ANGIOGRAPHY CHEST WITH CONTRAST
TECHNIQUE: Multidetector CT imaging of the chest was performed using the
standard protocol during bolus administration of intravenous
contrast. Multiplanar CT image reconstructions and MIPs were
obtained to evaluate the vascular anatomy.
CONTRAST:  100mL OMNIPAQUE IOHEXOL 350 MG/ML SOLN

[Series 6: thins · axial · 0.84mm/px · z∈[+1261,+1613]mm · 12 of 416 slices shown]
[im 32/416  lung]
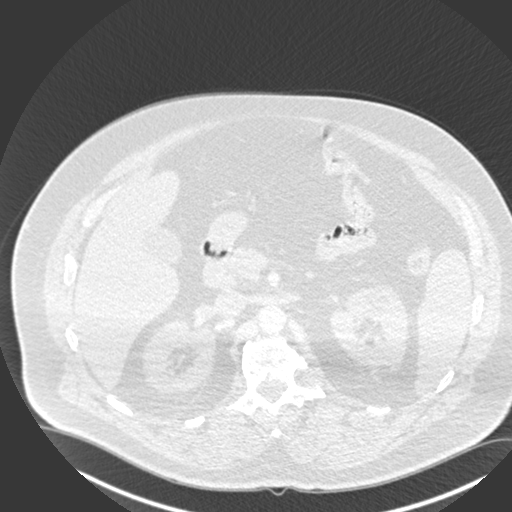
[im 64/416  mediastinal]
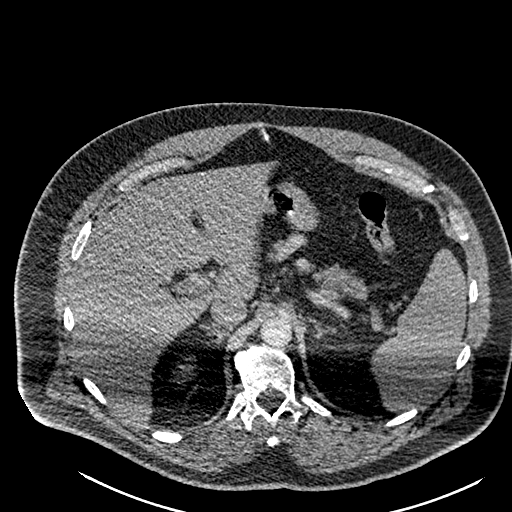
[im 96/416  lung]
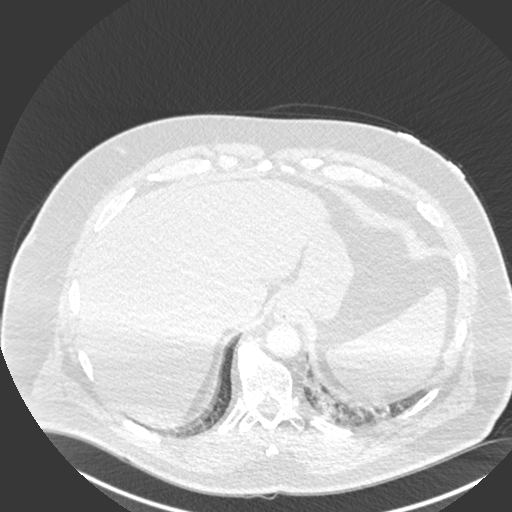
[im 128/416  mediastinal]
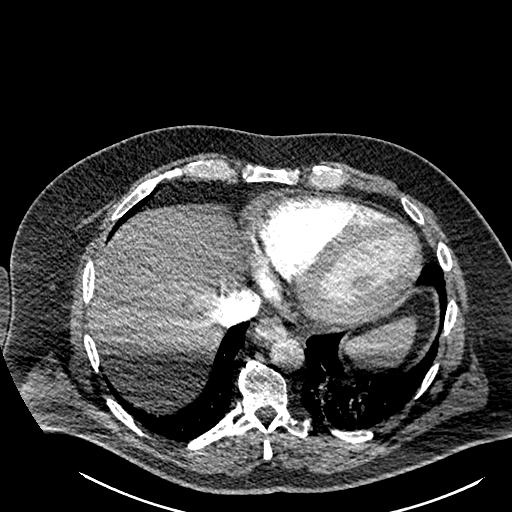
[im 160/416  lung]
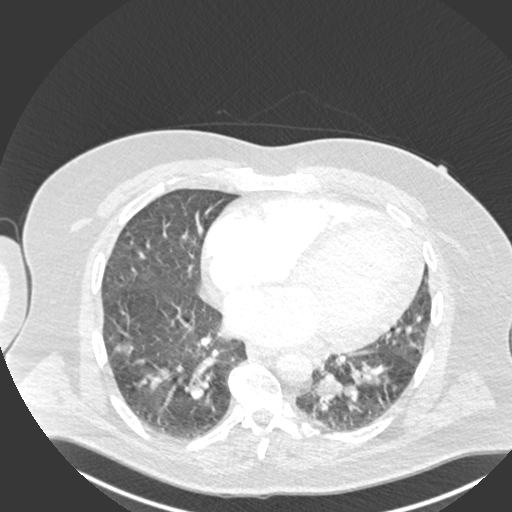
[im 192/416  mediastinal]
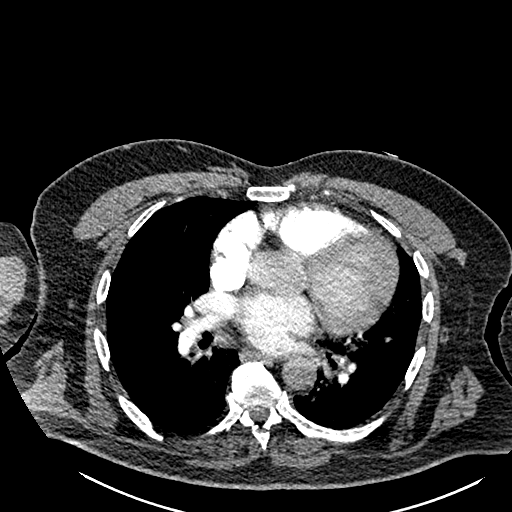
[im 224/416  lung]
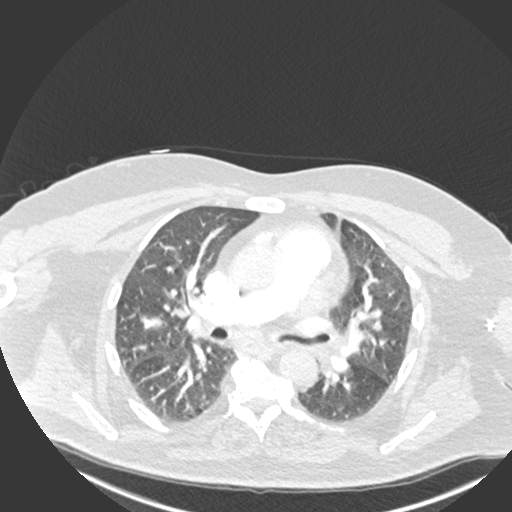
[im 256/416  mediastinal]
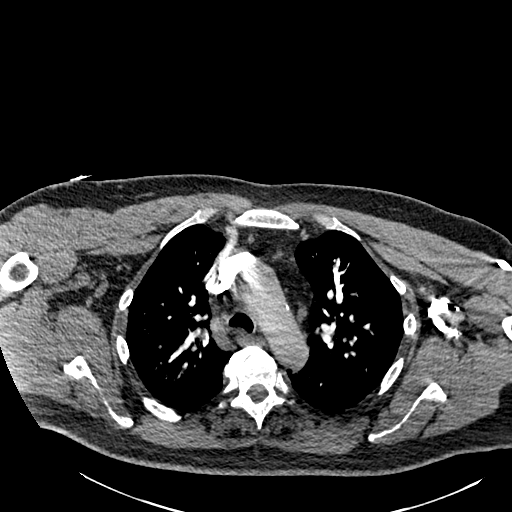
[im 288/416  lung]
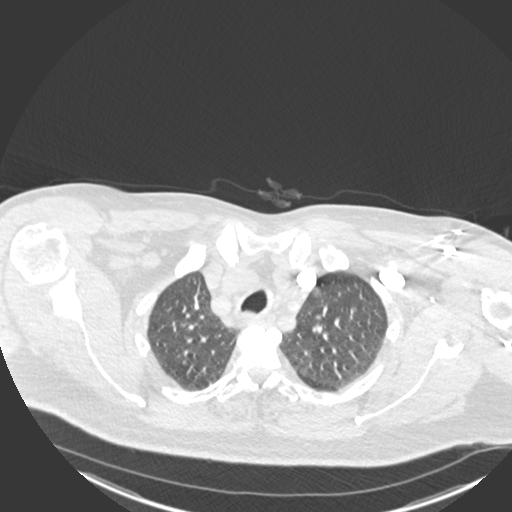
[im 320/416  mediastinal]
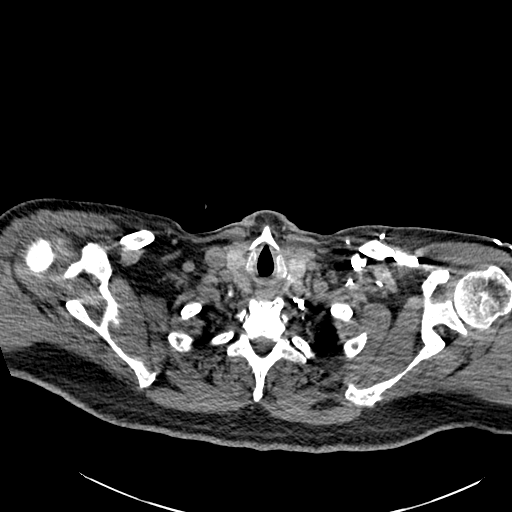
[im 352/416  lung]
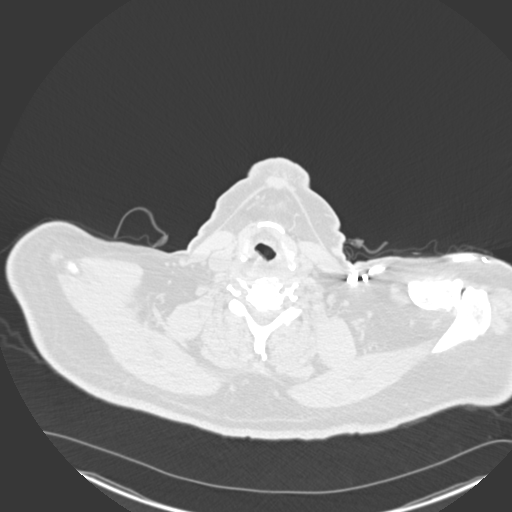
[im 384/416  mediastinal]
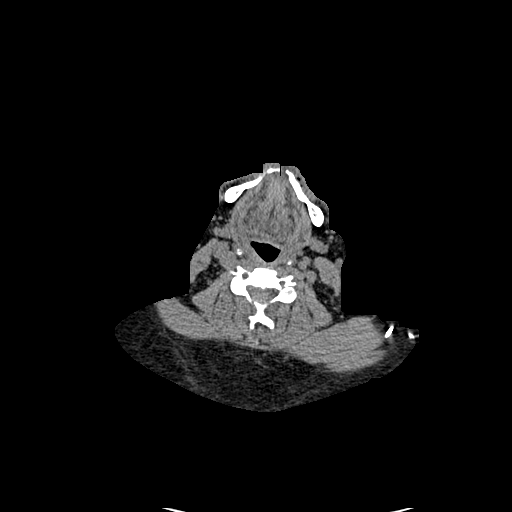

[Series 7: coronal mpr · coronal · 0.67mm/px · 1 of 194 slices shown]
[im 97/194  mediastinal]
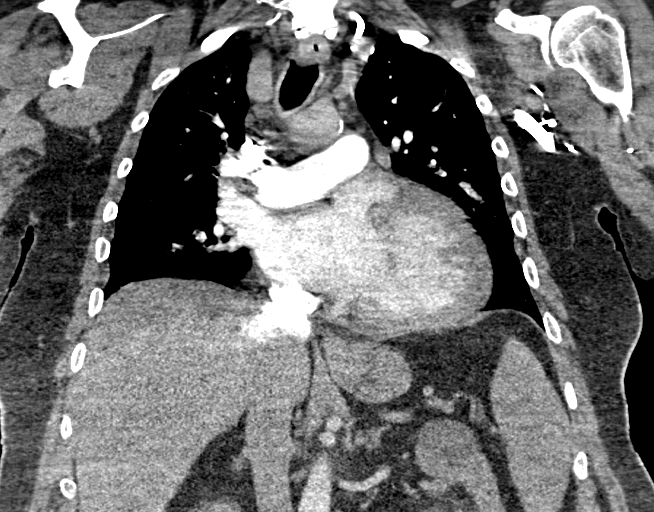

[Series 11: lung · axial · 0.84mm/px · z∈[+1349,+1571]mm · 4 of 185 slices shown]
[im 37/185  mediastinal]
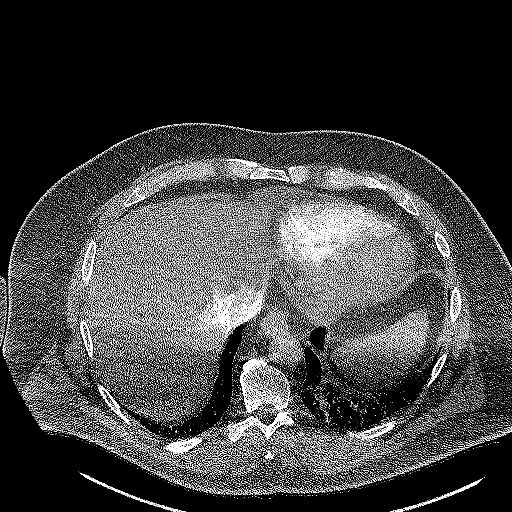
[im 74/185  mediastinal]
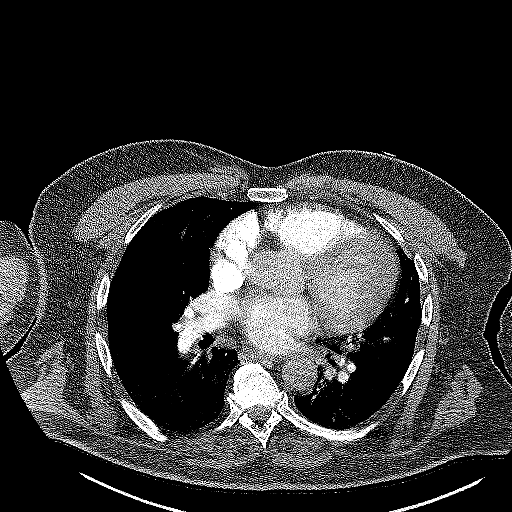
[im 111/185  mediastinal]
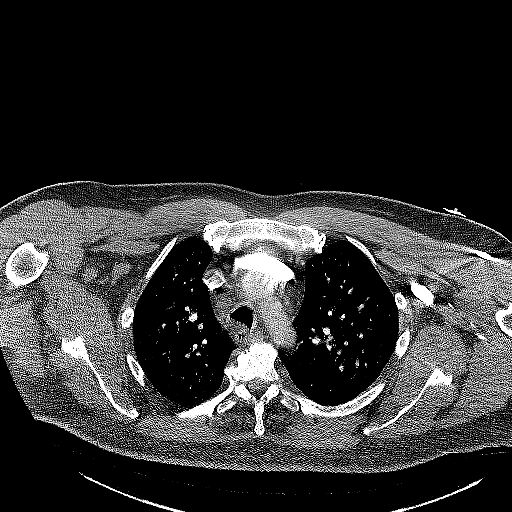
[im 148/185  mediastinal]
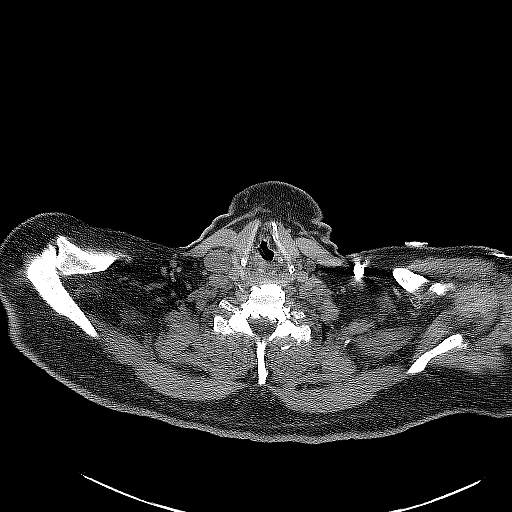

[17 of 36 positions shown; findings below may reference images not displayed]

FINDINGS: The study is markedly limited secondary to patient motion and the
inability of the patient to raise his arms up during the exam. As
result, motion artifact and beam hardening artifact are seen.

Cardiovascular: There is very mild calcification of the aortic arch
without evidence of aortic aneurysm. The subsegmental pulmonary
arteries are limited in evaluation secondary to suboptimal
opacification with intravenous contrast. Motion artifact is also
seen involving the segmental and subsegmental pulmonary arteries.
Ill-defined areas of intraluminal low attenuation are seen involving
multiple upper lobe and lower lobe branches of the bilateral
pulmonary arteries.

There is mild to moderate severity cardiomegaly. No pericardial
effusion.

Mediastinum/Nodes: Multiple subcentimeter pretracheal and AP window
lymph nodes are seen. Thyroid gland, trachea, and esophagus
demonstrate no significant findings.

Lungs/Pleura: Mild atelectasis and/or infiltrate is seen within the
posterior aspect of the left lung base.

There is no evidence of a pleural effusion or pneumothorax.

Upper Abdomen: No acute abnormality.

Musculoskeletal: No chest wall abnormality. No acute or significant
osseous findings.

Review of the MIP images confirms the above findings.
IMPRESSION: 1. Markedly limited study with areas of intraluminal low attenuation
involving bilateral upper lobe and lower lobe branches of the
pulmonary arteries. While this may be secondary to extensive areas
of artifact, bilateral pulmonary embolism cannot completely be
excluded. Additional evaluation with a nuclear medicine
ventilation/perfusion scan or follow-up chest CTA is recommended.
2. Mild left basilar atelectasis and/or infiltrate.

## 2020-07-31 MED ORDER — ASPIRIN 81 MG PO CHEW
81.0000 mg | CHEWABLE_TABLET | Freq: Every day | ORAL | Status: DC
  Administered 2020-07-31: 81 mg via ORAL
  Filled 2020-07-31: qty 1

## 2020-07-31 MED ORDER — ACETAMINOPHEN 325 MG PO TABS
650.0000 mg | ORAL_TABLET | Freq: Four times a day (QID) | ORAL | Status: DC | PRN
  Administered 2020-08-03 – 2020-08-04 (×3): 650 mg via ORAL
  Filled 2020-07-31 (×3): qty 2

## 2020-07-31 MED ORDER — STROKE: EARLY STAGES OF RECOVERY BOOK
Freq: Once | Status: DC
  Filled 2020-07-31: qty 1

## 2020-07-31 MED ORDER — PANTOPRAZOLE SODIUM 40 MG IV SOLR
40.0000 mg | Freq: Two times a day (BID) | INTRAVENOUS | Status: DC
  Administered 2020-07-31 – 2020-08-01 (×4): 40 mg via INTRAVENOUS
  Filled 2020-07-31 (×5): qty 40

## 2020-07-31 MED ORDER — METOPROLOL TARTRATE 5 MG/5ML IV SOLN
5.0000 mg | Freq: Three times a day (TID) | INTRAVENOUS | Status: DC
  Administered 2020-07-31: 5 mg via INTRAVENOUS
  Filled 2020-07-31: qty 5

## 2020-07-31 MED ORDER — QUETIAPINE FUMARATE 200 MG PO TABS
400.0000 mg | ORAL_TABLET | Freq: Every day | ORAL | Status: DC
  Administered 2020-07-31 – 2020-08-08 (×9): 400 mg via ORAL
  Filled 2020-07-31 (×9): qty 2

## 2020-07-31 MED ORDER — IOHEXOL 350 MG/ML SOLN
100.0000 mL | Freq: Once | INTRAVENOUS | Status: AC | PRN
  Administered 2020-07-31: 100 mL via INTRAVENOUS

## 2020-07-31 MED ORDER — INSULIN ASPART 100 UNIT/ML IJ SOLN
0.0000 [IU] | Freq: Three times a day (TID) | INTRAMUSCULAR | Status: DC
  Administered 2020-07-31: 5 [IU] via SUBCUTANEOUS
  Administered 2020-07-31: 2 [IU] via SUBCUTANEOUS
  Administered 2020-08-01 (×2): 3 [IU] via SUBCUTANEOUS
  Administered 2020-08-01: 2 [IU] via SUBCUTANEOUS
  Administered 2020-08-02 – 2020-08-03 (×4): 3 [IU] via SUBCUTANEOUS
  Administered 2020-08-04: 2 [IU] via SUBCUTANEOUS
  Administered 2020-08-04: 3 [IU] via SUBCUTANEOUS
  Administered 2020-08-04 – 2020-08-05 (×3): 2 [IU] via SUBCUTANEOUS
  Administered 2020-08-06 – 2020-08-08 (×2): 3 [IU] via SUBCUTANEOUS
  Filled 2020-07-31: qty 0.15

## 2020-07-31 MED ORDER — THIAMINE HCL 100 MG/ML IJ SOLN
100.0000 mg | Freq: Every day | INTRAMUSCULAR | Status: DC
  Administered 2020-07-31 – 2020-08-01 (×3): 100 mg via INTRAVENOUS
  Filled 2020-07-31 (×4): qty 2

## 2020-07-31 MED ORDER — ONDANSETRON HCL 4 MG PO TABS: 4.0000 mg | ORAL_TABLET | Freq: Four times a day (QID) | ORAL | Status: DC | PRN

## 2020-07-31 MED ORDER — ONDANSETRON HCL 4 MG/2ML IJ SOLN: 4.0000 mg | Freq: Four times a day (QID) | INTRAMUSCULAR | Status: DC | PRN

## 2020-07-31 MED ORDER — METOPROLOL TARTRATE 5 MG/5ML IV SOLN: 5.0000 mg | Freq: Three times a day (TID) | INTRAVENOUS | Status: DC | PRN

## 2020-07-31 MED ORDER — ASPIRIN 300 MG RE SUPP
300.0000 mg | Freq: Every day | RECTAL | Status: DC
  Filled 2020-07-31 (×5): qty 1

## 2020-07-31 MED ORDER — SODIUM CHLORIDE 0.9 % IV SOLN
500.0000 mg | INTRAVENOUS | Status: DC
  Administered 2020-07-31 – 2020-08-01 (×2): 500 mg via INTRAVENOUS
  Filled 2020-07-31 (×3): qty 500

## 2020-07-31 MED ORDER — SODIUM CHLORIDE 0.9% FLUSH
3.0000 mL | Freq: Two times a day (BID) | INTRAVENOUS | Status: DC
  Administered 2020-07-31 – 2020-08-09 (×13): 3 mL via INTRAVENOUS

## 2020-07-31 MED ORDER — SODIUM CHLORIDE 0.9% FLUSH: 3.0000 mL | INTRAVENOUS | Status: DC | PRN

## 2020-07-31 MED ORDER — HALOPERIDOL LACTATE 5 MG/ML IJ SOLN
1.0000 mg | Freq: Once | INTRAMUSCULAR | Status: AC
  Administered 2020-07-31: 1 mg via INTRAVENOUS
  Filled 2020-07-31: qty 1

## 2020-07-31 MED ORDER — ACETAMINOPHEN 650 MG RE SUPP: 650.0000 mg | Freq: Four times a day (QID) | RECTAL | Status: DC | PRN

## 2020-07-31 MED ORDER — LORAZEPAM 2 MG/ML IJ SOLN
1.0000 mg | Freq: Once | INTRAMUSCULAR | Status: AC
  Administered 2020-07-31: 1 mg via INTRAVENOUS
  Filled 2020-07-31: qty 1

## 2020-07-31 MED ORDER — SODIUM CHLORIDE 0.9 % IV SOLN
1.0000 g | Freq: Once | INTRAVENOUS | Status: AC
  Administered 2020-07-31: 1 g via INTRAVENOUS
  Filled 2020-07-31: qty 10

## 2020-07-31 MED ORDER — LORAZEPAM 2 MG/ML IJ SOLN
0.5000 mg | Freq: Once | INTRAMUSCULAR | Status: AC
  Administered 2020-07-31: 0.5 mg via INTRAVENOUS
  Filled 2020-07-31: qty 1

## 2020-07-31 MED ORDER — HEPARIN SODIUM (PORCINE) 5000 UNIT/ML IJ SOLN
5000.0000 [IU] | Freq: Three times a day (TID) | INTRAMUSCULAR | Status: DC
  Administered 2020-07-31: 5000 [IU] via SUBCUTANEOUS
  Filled 2020-07-31: qty 1

## 2020-07-31 MED ORDER — HALOPERIDOL LACTATE 5 MG/ML IJ SOLN: 1.0000 mg | Freq: Four times a day (QID) | INTRAMUSCULAR | Status: DC | PRN

## 2020-07-31 MED ORDER — HEPARIN SODIUM (PORCINE) 5000 UNIT/ML IJ SOLN
5000.0000 [IU] | Freq: Three times a day (TID) | INTRAMUSCULAR | Status: DC
  Administered 2020-07-31 – 2020-08-03 (×6): 5000 [IU] via SUBCUTANEOUS
  Filled 2020-07-31 (×9): qty 1

## 2020-07-31 MED ORDER — INSULIN DETEMIR 100 UNIT/ML ~~LOC~~ SOLN
10.0000 [IU] | Freq: Two times a day (BID) | SUBCUTANEOUS | Status: DC
  Administered 2020-07-31 – 2020-08-09 (×19): 10 [IU] via SUBCUTANEOUS
  Filled 2020-07-31 (×21): qty 0.1

## 2020-07-31 MED ORDER — INSULIN ASPART 100 UNIT/ML IJ SOLN
0.0000 [IU] | Freq: Every day | INTRAMUSCULAR | Status: DC
  Administered 2020-07-31: 2 [IU] via SUBCUTANEOUS
  Filled 2020-07-31: qty 0.05

## 2020-07-31 MED ORDER — HALOPERIDOL 1 MG PO TABS
1.0000 mg | ORAL_TABLET | Freq: Four times a day (QID) | ORAL | Status: DC | PRN
  Administered 2020-07-31: 1 mg via ORAL
  Filled 2020-07-31 (×2): qty 1

## 2020-07-31 MED ORDER — GATIFLOXACIN 0.5 % OP SOLN
1.0000 [drp] | Freq: Four times a day (QID) | OPHTHALMIC | Status: DC
  Administered 2020-07-31 – 2020-08-09 (×30): 1 [drp] via OPHTHALMIC
  Filled 2020-07-31 (×2): qty 2.5

## 2020-07-31 MED ORDER — SODIUM CHLORIDE 0.9 % IV SOLN: 250.0000 mL | INTRAVENOUS | Status: DC | PRN

## 2020-07-31 NOTE — ED Notes (Addendum)
Patient brought back from MRI because they were unable to keep patient on the table, he was fighting them.  Dwyane Dee, MD made aware and new order received.  MRI will attempt again in about 30 mins.  Sitter at bedside for safety.

## 2020-07-31 NOTE — ED Notes (Signed)
Patient's brother Eduard Clos update via phone.

## 2020-07-31 NOTE — ED Notes (Signed)
   07/31/20 1115  Vitals  BP (!) 153/102  MAP (mmHg) 118  Pulse Rate (!) 126  ECG Heart Rate (!) 128  Resp 16  MEWS COLOR  MEWS Score Color Yellow  Oxygen Therapy  SpO2 96 %  O2 Device Room Air  MEWS Score  MEWS Temp 0  MEWS Systolic 0  MEWS Pulse 2  MEWS RR 0  MEWS LOC 0  MEWS Score 2  Kumar, MD made aware patient's HR sustaining in the 120s.

## 2020-07-31 NOTE — ED Notes (Signed)
Patient transported to MRI 

## 2020-07-31 NOTE — Plan of Care (Signed)
Pt arrived to unit alert and oriented x1. Pt restless and diaphoretic. Pt hypertensive and tachycardic 110's RR 20's Pt on yellow muse protocol. Pt placed on telemetry. Pt skin assessed with x2 Rns - no evidence of skin breakdown.  Problem: Education: Goal: Knowledge of General Education information will improve Description: Including pain rating scale, medication(s)/side effects and non-pharmacologic comfort measures 07/31/2020 1546 by Barrington Ellison, RN Outcome: Not Progressing 07/31/2020 1545 by Barrington Ellison, RN Outcome: Not Progressing   Problem: Health Behavior/Discharge Planning: Goal: Ability to manage health-related needs will improve 07/31/2020 1546 by Barrington Ellison, RN Outcome: Not Progressing 07/31/2020 1545 by Barrington Ellison, RN Outcome: Not Progressing   Problem: Clinical Measurements: Goal: Ability to maintain clinical measurements within normal limits will improve 07/31/2020 1546 by Barrington Ellison, RN Outcome: Not Progressing 07/31/2020 1545 by Barrington Ellison, RN Outcome: Not Progressing Goal: Will remain free from infection 07/31/2020 1546 by Barrington Ellison, RN Outcome: Not Progressing 07/31/2020 1545 by Barrington Ellison, RN Outcome: Not Progressing Goal: Diagnostic test results will improve 07/31/2020 1546 by Barrington Ellison, RN Outcome: Not Progressing 07/31/2020 1545 by Barrington Ellison, RN Outcome: Not Progressing Goal: Respiratory complications will improve 07/31/2020 1546 by Barrington Ellison, RN Outcome: Not Progressing 07/31/2020 1545 by Barrington Ellison, RN Outcome: Not Progressing Goal: Cardiovascular complication will be avoided 07/31/2020 1546 by Barrington Ellison, RN Outcome: Not Progressing 07/31/2020 1545 by Barrington Ellison, RN Outcome: Not Progressing   Problem: Activity: Goal: Risk for activity intolerance will decrease 07/31/2020 1546 by Barrington Ellison, RN Outcome: Not Progressing 07/31/2020 1545 by Barrington Ellison, RN Outcome: Not Progressing    Problem: Nutrition: Goal: Adequate nutrition will be maintained 07/31/2020 1546 by Barrington Ellison, RN Outcome: Not Progressing 07/31/2020 1545 by Barrington Ellison, RN Outcome: Not Progressing   Problem: Coping: Goal: Level of anxiety will decrease 07/31/2020 1546 by Barrington Ellison, RN Outcome: Not Progressing 07/31/2020 1545 by Barrington Ellison, RN Outcome: Not Progressing   Problem: Elimination: Goal: Will not experience complications related to bowel motility 07/31/2020 1546 by Barrington Ellison, RN Outcome: Not Progressing 07/31/2020 1545 by Barrington Ellison, RN Outcome: Not Progressing Goal: Will not experience complications related to urinary retention 07/31/2020 1546 by Barrington Ellison, RN Outcome: Not Progressing 07/31/2020 1545 by Barrington Ellison, RN Outcome: Not Progressing   Problem: Pain Managment: Goal: General experience of comfort will improve 07/31/2020 1546 by Barrington Ellison, RN Outcome: Not Progressing 07/31/2020 1545 by Barrington Ellison, RN Outcome: Not Progressing   Problem: Safety: Goal: Ability to remain free from injury will improve 07/31/2020 1546 by Barrington Ellison, RN Outcome: Not Progressing 07/31/2020 1545 by Barrington Ellison, RN Outcome: Not Progressing   Problem: Skin Integrity: Goal: Risk for impaired skin integrity will decrease 07/31/2020 1546 by Barrington Ellison, RN Outcome: Not Progressing 07/31/2020 1545 by Barrington Ellison, RN Outcome: Not Progressing

## 2020-07-31 NOTE — ED Notes (Signed)
Pt resting comfortably in recliner. Chair alarm in place.

## 2020-07-31 NOTE — ED Notes (Signed)
Patient ambulated to from the chair to the bed for Scan

## 2020-07-31 NOTE — Progress Notes (Signed)
PT Cancellation Note  Patient Details Name: Matthew Barnes MRN: [id number] DOB: 26-Jul-1955   Cancelled Treatment:    Reason Eval/Treat Not Completed: Medical issues which prohibited therapy , in testing . Will follow when medically ready.  Claretha Cooper 07/31/2020, 9:50 AM  [place] Pager [phone] Office [phone]

## 2020-07-31 NOTE — Progress Notes (Signed)
PROGRESS NOTE    ESMOND HINCH  KYH:[id number] DOB: 1956/02/13 DOA: 07/30/2020 PCP: System, Provider Not In   Brief Narrative: This 65 years old male with PMH significant for bipolar disorder, diabetes, hyperlipidemia, hypertension, hypothyroidism presented in the ED with difficulty finding words and completing sentences.  History is limited due to patient's inability to complete sentence.  Patient has significant history of depression and bipolar and being managed by psychiatrist Dr. Quillian Quince.  Patient was slightly aphasic but there is no focal neurological deficits noted on exam. In the ED ethanol level is less than 10,  CT head is no acute intracranial abnormality,  urine drug screen is negative.  MRI, echo and carotid duplex is ordered.  CTA chest No evidence of PE, seems not conclusive but showed mild infiltrate.. Patient has been agitated and restless,  not cooperative to have an MRI. Neurology is consulted,  awaiting recommendation.   Assessment & Plan:   Principal Problem:   Aphasia Active Problems:   Hypertension associated with diabetes ([place])   Hyperlipidemia   Type 2 diabetes mellitus with diabetic neuropathy, with long-term current use of insulin ([facility])   Esophageal reflux   Bipolar affective, mixed ([place])   AMS (altered mental status)   Aphasia :  Patient presented with difficulty with finding words and completing sentences. Exact onset of symptoms unknown.  Patient does have history of bipolar disorder.  Differential diagnoses include speech disorder due to underlying psychiatric disorder or CVA. CT head negative for any acute abnormalities. Continue aspirin and statins. Obtain MRI, echocardiogram, carotid duplex. Neurology consulted, awaiting recommendation. Patient has been restless and agitated, MRI is pending. Patient was given Haldol, still remains uncooperative. Psych consulted will follow up recommendation. Neurochecks every 4 to 6 hours.   Hypertension:  Home  blood pressure medications were on hold to allow permissive hypertension.  Hyperlipidemia  : Continue Lipitor.  Diabetes mellitus type 2 :  Continue Levemir and sliding scale  Sinus tachycardia:  CTA chest was completed, shows artifacts. Started metoprolol 5 mg Iv q8hr prn. CT chest shows mild infiltrate. Patient was started on ceftriaxone and Zithromax. Obtain procalcitonin level and de-escalate antibiotics.   Bipolar disorder : Continue Seroquel, Haldol as needed Psych consulted awaiting recommendation.     DVT prophylaxis: Heparin Code Status: Full code. Family Communication:No family at bed side. Disposition Plan:   Status is: Inpatient  Remains inpatient appropriate because:Inpatient level of care appropriate due to severity of illness   Dispo: The patient is from: Home              Anticipated d/c is to: Home              Patient currently is not medically stable to d/c.   Difficult to place patient No   Consultants:     Neurology , Psychiatry  Procedures: CT head, echo, MRI. Antimicrobials: Anti-infectives (From admission, onward)   Start     Dose/Rate Route Frequency Ordered Stop   07/31/20 0400  cefTRIAXone (ROCEPHIN) 1 g in sodium chloride 0.9 % 100 mL IVPB        1 g 200 mL/hr over 30 Minutes Intravenous  Once 07/31/20 0355 07/31/20 0545   07/31/20 0400  azithromycin (ZITHROMAX) 500 mg in sodium chloride 0.9 % 250 mL IVPB        500 mg 250 mL/hr over 60 Minutes Intravenous Every 24 hours 07/31/20 0355        Subjective: Patient was seen and examined at bedside.  Overnight  events noted.  Patient is sitting on the chair but he is confused, not cooperative.  He appears restless and agitated.  Objective: Vitals:   07/31/20 1300 07/31/20 1301 07/31/20 1330 07/31/20 1522  BP: 130/79  (!) 135/97 (!) 146/100  Pulse: (!) 119 94 99 (!) 106  Resp: (!) 22 20 (!) 25 18  Temp:    98 F (36.7 C)  TempSrc:    Oral  SpO2: (!) 85% 95% 90% 93%  Weight:     125.8 kg  Height:    6\' 1"  (1.854 m)    Intake/Output Summary (Last 24 hours) at 07/31/2020 1654 Last data filed at 07/31/2020 1235 Gross per 24 hour  Intake 730 ml  Output 200 ml  Net 530 ml   Filed Weights   07/30/20 2029 07/31/20 1522  Weight: 132.2 kg 125.8 kg    Examination:  General exam: Appears restless and agitated.  He is not cooperative. Respiratory system: Clear to auscultation. Respiratory effort normal. Cardiovascular system: S1 & S2 heard, RRR. No JVD, murmurs, rubs, gallops or clicks. No pedal edema. Gastrointestinal system: Abdomen is nondistended, soft and nontender. No organomegaly or masses felt. Normal bowel sounds heard. Central nervous system: Alert and oriented. No focal neurological deficits. Extremities: All 4 extremities.  No focal neurological deficits. Skin: No rashes, lesions or ulcers Psychiatry: Not completed    Data Reviewed: I have personally reviewed following labs and imaging studies  CBC: Recent Labs  Lab 07/30/20 2101 07/31/20 0643  WBC 8.4 8.8  NEUTROABS 5.5  --   HGB 12.2* 12.5*  HCT 40.3 40.7  MCV 83.1 82.7  PLT 203 478   Basic Metabolic Panel: Recent Labs  Lab 07/30/20 2101 07/30/20 2120 07/31/20 0643  NA 138  --  143  K 3.8  --  3.9  CL 106  --  107  CO2 23  --  24  GLUCOSE 207*  --  241*  BUN 14  --  16  CREATININE 1.24  --  1.18  CALCIUM 9.1  --  9.6  MG  --  1.9 2.1  PHOS  --  3.5 3.5   GFR: Estimated Creatinine Clearance: 87.9 mL/min (by C-G formula based on SCr of 1.18 mg/dL). Liver Function Tests: Recent Labs  Lab 07/30/20 2101 07/31/20 0643  AST 33 35  ALT 30 33  ALKPHOS 49 50  BILITOT 1.2 1.0  PROT 7.3 7.2  ALBUMIN 4.0 4.1   No results for input(s): LIPASE, AMYLASE in the last 168 hours. Recent Labs  Lab 07/31/20 0740  AMMONIA 29   Coagulation Profile: No results for input(s): INR, PROTIME in the last 168 hours. Cardiac Enzymes: Recent Labs  Lab 07/31/20 0127 07/31/20 0643  CKTOTAL  404* 417*   BNP (last 3 results) No results for input(s): PROBNP in the last 8760 hours. HbA1C: Recent Labs    07/31/20 0409  HGBA1C 8.3*   CBG: Recent Labs  Lab 07/31/20 0252 07/31/20 0749 07/31/20 1034 07/31/20 1159  GLUCAP 231* 228* 231* 175*   Lipid Profile: Recent Labs    07/31/20 0409  CHOL 116  HDL 24*  LDLCALC 79  TRIG 65  CHOLHDL 4.8   Thyroid Function Tests: No results for input(s): TSH, T4TOTAL, FREET4, T3FREE, THYROIDAB in the last 72 hours. Anemia Panel: Recent Labs    07/31/20 0740  VITAMINB12 397   Sepsis Labs: Recent Labs  Lab 07/31/20 0127 07/31/20 0409  LATICACIDVEN 1.4 1.4    Recent Results (from the past  240 hour(s))  Resp Panel by RT-PCR (Flu A&B, Covid) Nasopharyngeal Swab     Status: None   Collection Time: 07/30/20 11:15 PM   Specimen: Nasopharyngeal Swab; Nasopharyngeal(NP) swabs in vial transport medium  Result Value Ref Range Status   SARS Coronavirus 2 by RT PCR NEGATIVE NEGATIVE Final    Comment: (NOTE) SARS-CoV-2 target nucleic acids are NOT DETECTED.  The SARS-CoV-2 RNA is generally detectable in upper respiratory specimens during the acute phase of infection. The lowest concentration of SARS-CoV-2 viral copies this assay can detect is 138 copies/mL. A negative result does not preclude SARS-Cov-2 infection and should not be used as the sole basis for treatment or other patient management decisions. A negative result may occur with  improper specimen collection/handling, submission of specimen other than nasopharyngeal swab, presence of viral mutation(s) within the areas targeted by this assay, and inadequate number of viral copies(<138 copies/mL). A negative result must be combined with clinical observations, patient history, and epidemiological information. The expected result is Negative.  Fact Sheet for Patients:  EntrepreneurPulse.com.au  Fact Sheet for Healthcare Providers:   IncredibleEmployment.be  This test is no t yet approved or cleared by the [place] FDA and  has been authorized for detection and/or diagnosis of SARS-CoV-2 by FDA under an Emergency Use Authorization (EUA). This EUA will remain  in effect (meaning this test can be used) for the duration of the COVID-19 declaration under Section 564(b)(1) of the Act, 21 U.S.C.section 360bbb-3(b)(1), unless the authorization is terminated  or revoked sooner.       Influenza A by PCR NEGATIVE NEGATIVE Final   Influenza B by PCR NEGATIVE NEGATIVE Final    Comment: (NOTE) The Xpert Xpress SARS-CoV-2/FLU/RSV plus assay is intended as an aid in the diagnosis of influenza from Nasopharyngeal swab specimens and should not be used as a sole basis for treatment. Nasal washings and aspirates are unacceptable for Xpert Xpress SARS-CoV-2/FLU/RSV testing.  Fact Sheet for Patients: EntrepreneurPulse.com.au  Fact Sheet for Healthcare Providers: IncredibleEmployment.be  This test is not yet approved or cleared by the [place] FDA and has been authorized for detection and/or diagnosis of SARS-CoV-2 by FDA under an Emergency Use Authorization (EUA). This EUA will remain in effect (meaning this test can be used) for the duration of the COVID-19 declaration under Section 564(b)(1) of the Act, 21 U.S.C. section 360bbb-3(b)(1), unless the authorization is terminated or revoked.  Performed at [facility], [place] [address], [place], [place] [address]     Radiology Studies: DG Chest 2 View  Result Date: 07/30/2020 CLINICAL DATA:  Cough EXAM: CHEST - 2 VIEW COMPARISON:  05/21/2020 FINDINGS: The heart size and mediastinal contours are within normal limits. Both lungs are clear. The visualized skeletal structures are unremarkable. IMPRESSION: No active cardiopulmonary disease. Electronically Signed   By: Ulyses Jarred M.D.   On: 07/30/2020  21:06   CT Head Wo Contrast  Result Date: 07/30/2020 CLINICAL DATA:  Neurologic deficit, worsening depression EXAM: CT HEAD WITHOUT CONTRAST TECHNIQUE: Contiguous axial images were obtained from the base of the skull through the vertex without intravenous contrast. COMPARISON:  07/13/2019, 05/13/2010 FINDINGS: Brain: No acute infarct or hemorrhage. Lateral ventricles and midline structures are unremarkable. No acute extra-axial fluid collections. No mass effect. Vascular: No hyperdense vessel or unexpected calcification. Skull: Normal. Negative for fracture or focal lesion. Sinuses/Orbits: Mild mucoperiosteal thickening of the right maxillary and left sphenoid sinuses. Other: None. IMPRESSION: 1. No acute intracranial process. Electronically Signed   By: [name]  Owens Shark M.D.   On: 07/30/2020 23:26   CT ANGIO CHEST PE W OR WO CONTRAST  Result Date: 07/31/2020 CLINICAL DATA:  Cough. EXAM: CT ANGIOGRAPHY CHEST WITH CONTRAST TECHNIQUE: Multidetector CT imaging of the chest was performed using the standard protocol during bolus administration of intravenous contrast. Multiplanar CT image reconstructions and MIPs were obtained to evaluate the vascular anatomy. CONTRAST:  14mL OMNIPAQUE IOHEXOL 350 MG/ML SOLN COMPARISON:  None. FINDINGS: The study is markedly limited secondary to patient motion and the inability of the patient to raise his arms up during the exam. As result, motion artifact and beam hardening artifact are seen. Cardiovascular: There is very mild calcification of the aortic arch without evidence of aortic aneurysm. The subsegmental pulmonary arteries are limited in evaluation secondary to suboptimal opacification with intravenous contrast. Motion artifact is also seen involving the segmental and subsegmental pulmonary arteries. Ill-defined areas of intraluminal low attenuation are seen involving multiple upper lobe and lower lobe branches of the bilateral pulmonary arteries. There is mild to moderate  severity cardiomegaly. No pericardial effusion. Mediastinum/Nodes: Multiple subcentimeter pretracheal and AP window lymph nodes are seen. Thyroid gland, trachea, and esophagus demonstrate no significant findings. Lungs/Pleura: Mild atelectasis and/or infiltrate is seen within the posterior aspect of the left lung base. There is no evidence of a pleural effusion or pneumothorax. Upper Abdomen: No acute abnormality. Musculoskeletal: No chest wall abnormality. No acute or significant osseous findings. Review of the MIP images confirms the above findings. IMPRESSION: 1. Markedly limited study with areas of intraluminal low attenuation involving bilateral upper lobe and lower lobe branches of the pulmonary arteries. While this may be secondary to extensive areas of artifact, bilateral pulmonary embolism cannot completely be excluded. Additional evaluation with a nuclear medicine ventilation/perfusion scan or follow-up chest CTA is recommended. 2. Mild left basilar atelectasis and/or infiltrate. Electronically Signed   By: Virgina Norfolk M.D.   On: 07/31/2020 03:02   VAS US CAROTID (at [facility] and [facility] only)  Result Date: 07/31/2020 Carotid Arterial Duplex Study Patient Name:  IOANIS CARTER  Date of Exam:   07/31/2020 Medical Rec #: [id number]     Accession #:    [id number] Date of Birth: [birth year]    Patient Gender: M Patient Age:   64Y Exam Location:  [facility] Procedure:      VAS US CAROTID Referring Phys: [id number] [place] --------------------------------------------------------------------------------  Indications:       Aphasia. Risk Factors:      Hypertension, hyperlipidemia, Diabetes. Limitations        Today's exam was limited due to the body habitus of the                    patient, the patient's respiratory variation and patient                    agitation, constant patient movement. Comparison Study:  No prior studies. Performing Technologist: Oliver Hum RVT  Examination Guidelines: A complete  evaluation includes B-mode imaging, spectral Doppler, color Doppler, and power Doppler as needed of all accessible portions of each vessel. Bilateral testing is considered an integral part of a complete examination. Limited examinations for reoccurring indications may be performed as noted.  Right Carotid Findings: +----------+--------+--------+--------+-----------------------+--------+           PSV cm/sEDV cm/sStenosisPlaque Description     Comments +----------+--------+--------+--------+-----------------------+--------+ CCA Prox  84      16  smooth and heterogenoustortuous +----------+--------+--------+--------+-----------------------+--------+ CCA Distal43      8               smooth and heterogenous         +----------+--------+--------+--------+-----------------------+--------+ ICA Prox  43      13              smooth and heterogenous         +----------+--------+--------+--------+-----------------------+--------+ ICA Distal67      23                                     tortuous +----------+--------+--------+--------+-----------------------+--------+ ECA       52      8                                               +----------+--------+--------+--------+-----------------------+--------+ +----------+--------+-------+--------+-------------------+           PSV cm/sEDV cmsDescribeArm Pressure (mmHG) +----------+--------+-------+--------+-------------------+ [id number]                                        +----------+--------+-------+--------+-------------------+ +---------+--------+--+--------+-+---------+ VertebralPSV cm/s30EDV cm/s9Antegrade +---------+--------+--+--------+-+---------+  Left Carotid Findings: +----------+--------+--------+--------+-----------------------+--------+           PSV cm/sEDV cm/sStenosisPlaque Description     Comments +----------+--------+--------+--------+-----------------------+--------+ CCA Prox   70      9               smooth and heterogenous         +----------+--------+--------+--------+-----------------------+--------+ CCA Distal55      12              smooth and heterogenous         +----------+--------+--------+--------+-----------------------+--------+ ICA Prox  54      15              smooth and heterogenoustortuous +----------+--------+--------+--------+-----------------------+--------+ ICA Distal54      14                                     tortuous +----------+--------+--------+--------+-----------------------+--------+ ECA       66      9                                               +----------+--------+--------+--------+-----------------------+--------+ Unable to obtain vertebral and subclavian artery images due to patient agitation.  Summary: Right Carotid: Velocities in the right ICA are consistent with a 1-39% stenosis. Left Carotid: Velocities in the left ICA are consistent with a 1-39% stenosis. Vertebrals: Right vertebral artery demonstrates antegrade flow. *See table(s) above for measurements and observations.     Preliminary     Scheduled Meds: .  stroke: mapping our early stages of recovery book   Does not apply Once  . gatifloxacin  1 drop Both Eyes QID  . heparin  5,000 Units Subcutaneous Q8H  . insulin aspart  0-15 Units Subcutaneous TID WC  . insulin aspart  0-5 Units Subcutaneous QHS  . insulin detemir  10 Units Subcutaneous BID  .  metoprolol tartrate  5 mg Intravenous Q8H  . pantoprazole (PROTONIX) IV  40 mg Intravenous Q12H  . QUEtiapine  400 mg Oral QHS  . sodium chloride flush  3 mL Intravenous Q12H  . thiamine injection  100 mg Intravenous Daily   Continuous Infusions: . sodium chloride    . azithromycin Stopped (07/31/20 0738)     LOS: 0 days    Time spent: 35 mins    Early Steel, MD [facility]   If 7PM-7AM, please contact night-coverage

## 2020-07-31 NOTE — Progress Notes (Signed)
Carotid artery duplex has been completed. Preliminary results can be found in CV Proc through chart review.   07/31/20 9:20 AM Matthew Barnes RVT

## 2020-07-31 NOTE — Progress Notes (Signed)
OT Cancellation Note  Patient Details Name: Matthew Barnes MRN: [id number] DOB: [birth year]   Cancelled Treatment:    Reason Eval/Treat Not Completed: Patient not medically ready. Patient getting echo upon arrival, per notes staff having difficulty completing exams/tests due to patient agitation. Will check back 5/4 as schedule permits.   Delbert Phenix OT OT pager: [place] 07/31/2020, 9:30 AM

## 2020-07-31 NOTE — ED Notes (Addendum)
Patient still trying to get out of bed, fighting the sitter at bedside. New verbal order for 1mg  IV haldol from Dwyane Dee, MD.

## 2020-07-31 NOTE — ED Notes (Signed)
Patient continues to be restless and agitated at times.  ECHO unable to complete ECHO due to patient's agitation.  Dwyane Dee, MD made aware and received new orders.

## 2020-07-31 NOTE — Consult Note (Addendum)
Neurology Consultation  Reason for Consult: Delirium versus confusion versus stroke  Referring Physician: Dr. Posey Pronto  CC: Aphasia  History is obtained from: Chart review, unable to reach emergency contact via telephone  HPI: Matthew Barnes is a 65 y.o. male with a medical history significant for bipolar disorder, type 2 DM, hyperlipidemia, memory impairment, and hypertension who presented to the ED 07/30/2020 for evaluation of depression and progressive word finding difficulties. On arrival, the EDP noted that Matthew Barnes was having trouble with complete sentences and word finding but is able to provide some history to the EDP including history of bipolar disease and absence of suicidal ideation. Matthew Barnes was admitted for further evaluation but due to patient agitation, MRI and echo were unable to be obtained and Matthew Barnes required Haldol for agitation / restlessness in the ED. Neurology was consulted for further evaluation of aphasia. Initial assessment approximately 2.5 hours after IV Haldol given.  Patient diagnosed with memory impairment by Dr. Volanda Napoleon on 06/04/2020: " Memory impairment  -MOCA score 18/30 with extra point added for education </= 12 yrs -MRI brain w wo contrast 07/13/19 with T2 hyperintensity in the supratentorial white matter is nonspecific but may reflect minor chronic microvascular ischemic changes.  Prominence of ventricles and sulci reflects mild generalized parenchymal volume loss. -discussed obtaining labs to evaluate reversible causes -consider Neruopsych testing. -MRI brain ordered by pcp. -continue f/u with Neurology - Plan: Vitamin B12, Vitamin D, 25-hydroxy, Brain Natriuretic Peptide, Folate, CBC with Differential/Platelet, TSH, CMP, Lithium level"  LKW: Unclear per EDP, word finding difficulty had been going on "for a while"  ROS: Unable to obtain due to altered mental status.   Past Medical History:  Diagnosis Date  . Bipolar II disorder - Managed by Marye Round  [phone]) 05/03/2013  . Diabetes mellitus   . Dysphagia   . Hyperlipidemia   . Hypertension   . Unspecified hypothyroidism 05/03/2013   Past Surgical History:  Procedure Laterality Date  . KNEE SURGERY     scope on left knee   Family History  Problem Relation Age of Onset  . Diabetes Mother   . Hypertension Mother   . Heart disease Mother   . Heart disease Father   . Heart attack Father 50  . Diabetes Father   . Hypertension Father   . Depression Father   . Diabetes Sister   . Depression Paternal Grandmother   . Depression Other    Social History:   reports that Matthew Barnes has never smoked. Matthew Barnes has never used smokeless tobacco. Matthew Barnes reports that Matthew Barnes does not drink alcohol and does not use drugs.  Medications  Current Facility-Administered Medications:  .   stroke: mapping our early stages of recovery book, , Does not apply, Once, Florina Ou V, MD .  0.9 %  sodium chloride infusion, 250 mL, Intravenous, PRN, Para Skeans, MD .  acetaminophen (TYLENOL) tablet 650 mg, 650 mg, Oral, Q6H PRN **OR** acetaminophen (TYLENOL) suppository 650 mg, 650 mg, Rectal, Q6H PRN, Florina Ou V, MD .  azithromycin (ZITHROMAX) 500 mg in sodium chloride 0.9 % 250 mL IVPB, 500 mg, Intravenous, Q24H, Para Skeans, MD, Stopped at 07/31/20 [phone] .  gatifloxacin (ZYMAXID) 0.5 % ophthalmic drops 1 drop, 1 drop, Both Eyes, QID, Patel, Ekta V, MD, 1 drop at 07/31/20 1222 .  heparin injection 5,000 Units, 5,000 Units, Subcutaneous, Q8H, Patel, Ekta V, MD .  insulin aspart (novoLOG) injection 0-15 Units, 0-15 Units, Subcutaneous, TID WC, Para Skeans, MD, 5 Units  at 07/31/20 [id number] .  insulin aspart (novoLOG) injection 0-5 Units, 0-5 Units, Subcutaneous, QHS, Para Skeans, MD, 2 Units at 07/31/20 0303 .  insulin detemir (LEVEMIR) injection 10 Units, 10 Units, Subcutaneous, BID, Para Skeans, MD, 10 Units at 07/31/20 1034 .  metoprolol tartrate (LOPRESSOR) injection 5 mg, 5 mg, Intravenous, Q8H, Shawna Clamp, MD,  5 mg at 07/31/20 1219 .  ondansetron (ZOFRAN) tablet 4 mg, 4 mg, Oral, Q6H PRN **OR** ondansetron (ZOFRAN) injection 4 mg, 4 mg, Intravenous, Q6H PRN, Posey Pronto, Ekta V, MD .  pantoprazole (PROTONIX) injection 40 mg, 40 mg, Intravenous, Q12H, Para Skeans, MD, 40 mg at 07/31/20 0923 .  QUEtiapine (SEROQUEL) tablet 400 mg, 400 mg, Oral, QHS, Patel, Ekta V, MD .  sodium chloride flush (NS) 0.9 % injection 3 mL, 3 mL, Intravenous, Q12H, Florina Ou V, MD, 3 mL at 07/31/20 0940 .  sodium chloride flush (NS) 0.9 % injection 3 mL, 3 mL, Intravenous, PRN, Para Skeans, MD .  thiamine (B-1) injection 100 mg, 100 mg, Intravenous, Daily, Para Skeans, MD, 100 mg at 07/31/20 [id number]  Current Outpatient Medications:  .  amLODipine (NORVASC) 10 MG tablet, TAKE 1 TABLET BY MOUTH EVERY DAY (Patient taking differently: Take 10 mg by mouth daily.), Disp: 90 tablet, Rfl: 2 .  Blood Pressure Monitoring (BLOOD PRESSURE CUFF) MISC, Use as directed (Patient taking differently: 1 each by Other route as directed.), Disp: 1 each, Rfl: 0 .  gabapentin (NEURONTIN) 300 MG capsule, TAKE 1 CAPSULE BY MOUTH THREE TIMES A DAY (Patient taking differently: Take 300 mg by mouth 3 (three) times daily.), Disp: 270 capsule, Rfl: 0 .  hydrochlorothiazide (HYDRODIURIL) 25 MG tablet, TAKE 1/2 TABLET BY MOUTH EVERY DAY (Patient taking differently: Take 25 mg by mouth daily.), Disp: 45 tablet, Rfl: 1 .  Insulin Syringe-Needle U-100 (INSULIN SYRINGE 1CC/31GX5/16") 31G X 5/16" 1 ML MISC, Use 3 needles per day (Patient taking differently: 1 each by Other route in the morning, at noon, and at bedtime.), Disp: 100 each, Rfl: 3 .  lithium carbonate 300 MG capsule, Take 3 capsules (900 mg total) by mouth daily., Disp: , Rfl:  .  losartan (COZAAR) 100 MG tablet, TAKE 1 TABLET BY MOUTH EVERY DAY (Patient taking differently: Take 100 mg by mouth daily.), Disp: 90 tablet, Rfl: 0 .  lovastatin (MEVACOR) 20 MG tablet, TAKE 1 TABLET BY MOUTH EVERY DAY (Patient  taking differently: Take 20 mg by mouth daily at 6 PM.), Disp: 90 tablet, Rfl: 0 .  metFORMIN (GLUCOPHAGE) 1000 MG tablet, TAKE 1 TABLET BY MOUTH 2 TIMES DAILY WITH A MEAL. (Patient taking differently: Take 1,000 mg by mouth 2 (two) times daily with a meal.), Disp: 180 tablet, Rfl: 0 .  NOVOLIN 70/30 (70-30) 100 UNIT/ML injection, INJECT 40 UNITS UNDER THE SKIN BEFORE BREAKFAST AND 10 UNITS BEFORE SUPPER. (Patient taking differently: Inject 40 Units into the skin 2 (two) times daily with a meal.), Disp: 20 mL, Rfl: 2 .  ONETOUCH VERIO test strip, USE AS INSTRUCTED TO CHECK BLOOD SUGAR THREE TIMES A DAY. (Patient taking differently: 1 each by Other route in the morning, at noon, and at bedtime.), Disp: 300 each, Rfl: 1 .  pantoprazole (PROTONIX) 20 MG tablet, TAKE 1 TABLET BY MOUTH TWICE A DAY BEFORE MEALS (Patient taking differently: Take 20 mg by mouth 2 (two) times daily before a meal.), Disp: 60 tablet, Rfl: 1 .  QUEtiapine (SEROQUEL) 200 MG tablet, Take 200-300 mg by mouth at  bedtime., Disp: , Rfl:  .  sildenafil (VIAGRA) 50 MG tablet, Take 1 tablet (50 mg total) by mouth daily as needed for erectile dysfunction. Do not take more then once in 24 hours., Disp: 10 tablet, Rfl: 3  Exam: Current vital signs: BP (!) 135/97   Pulse 99   Temp (!) 97.2 F (36.2 C) (Axillary)   Resp (!) 25   Ht 6\' 1"  (1.854 m)   Wt 132.2 kg   SpO2 90%   BMI 38.45 kg/m  Vital signs in last 24 hours: Temp:  [97.2 F (36.2 C)-99.2 F (37.3 C)] 97.2 F (36.2 C) (05/03 1233) Pulse Rate:  [94-136] 99 (05/03 1330) Resp:  [14-32] 25 (05/03 1330) BP: (130-179)/(79-114) 135/97 (05/03 1330) SpO2:  [85 %-99 %] 90 % (05/03 1330) Weight:  [132.2 kg] 132.2 kg (05/02 2029)  GENERAL: Somnolent, in no acute distress Head: Normocephalic and atraumatic, dry mm EENT: Conjunctival erythema present bilaterally with white discharge present bilaterally. No OP obstruction.  LUNGS: Tachypneic on cardiac monitor, non-labored  breathing CV: Regular rate and intermittently tachycardic on cardiac monitor ABDOMEN: Soft, non-tender Ext: warm, 2+ pitting edema in bilateral lower extremities, no obvious deformity  NEURO:  Mental Status: Somnolent, brief eye opening to loud voice or tactile stimuli.  Speech/Language: speech is severely aphasic; Matthew Barnes answers "yes/no" to questions but Matthew Barnes is not consistent (says it is 22 and with choices says no to "2022". States "yes" to we are in the hospital and in [place]). Matthew Barnes is unable to state his name, age, or month. When asked if Matthew Barnes knows what Matthew Barnes wants to say but cannot find the words Matthew Barnes states "yes". Poor attention is noted. Patient often drifts back to sleep during examination.  Cranial Nerves:  II: PERRL 4 mm/brisk.  III, IV, VI: Fixates and tracks examiner around ED room.  V: Unable to assess due to patient's mental status and aphasia VII: Face is symmetric resting and grimacing  VIII: Hearing is intact to voice IX, X: Spontaneous cough present  XI: Unable to assess due to patient's mental status and aphsaia XII: Tongue protrudes midline without fasciculations.   Motor: Withdrawals briskly and strongly throughout all 4 extremities with noxious stimuli. Does not follow commands. Will hold right lower extremity and left upper extremity antigravity with constant coaching.  Patient resists passive ROM and tenses extremity being manipulated. Bulk is normal.  Sensation: Withdrawals briskly to noxious stimuli throughout each extremity. Coordination: Unable to assess due to patient's mental status and aphsaia DTRs: 2+ patellae, tenses biceps consistently on assessment  Gait- deferred  Labs I have reviewed labs in epic and the results pertinent to this consultation are: CBC    Component Value Date/Time   WBC 8.8 07/31/2020 0643   RBC 4.92 07/31/2020 0643   HGB 12.5 (L) 07/31/2020 0643   HCT 40.7 07/31/2020 0643   PLT 224 07/31/2020 0643   MCV 82.7 07/31/2020 0643   MCH 25.4  (L) 07/31/2020 0643   MCHC 30.7 07/31/2020 0643   RDW 14.3 07/31/2020 0643   LYMPHSABS 1.7 07/30/2020 2101   MONOABS 0.8 07/30/2020 2101   EOSABS 0.2 07/30/2020 2101   BASOSABS 0.1 07/30/2020 2101   CMP     Component Value Date/Time   NA 143 07/31/2020 0643   K 3.9 07/31/2020 0643   CL 107 07/31/2020 0643   CO2 24 07/31/2020 0643   GLUCOSE 241 (H) 07/31/2020 0643   BUN 16 07/31/2020 0643   CREATININE 1.18 07/31/2020 0643   CALCIUM  9.6 07/31/2020 0643   PROT 7.2 07/31/2020 0643   ALBUMIN 4.1 07/31/2020 0643   AST 35 07/31/2020 0643   ALT 33 07/31/2020 0643   ALKPHOS 50 07/31/2020 0643   BILITOT 1.0 07/31/2020 0643   GFRNONAA >60 07/31/2020 0643   GFRAA >90 04/18/2011 2000   Lipid Panel     Component Value Date/Time   CHOL 116 07/31/2020 0409   TRIG 65 07/31/2020 0409   HDL 24 (L) 07/31/2020 0409   CHOLHDL 4.8 07/31/2020 0409   VLDL 13 07/31/2020 0409   LDLCALC 79 07/31/2020 0409   LDLDIRECT 73.0 09/01/2017 1054   Lab Results  Component Value Date   HGBA1C 8.3 (H) 07/31/2020   Lab Results  Component Value Date   VITAMINB12 397 07/31/2020   Lab Results  Component Value Date   CKTOTAL 417 (H) 07/31/2020   CRP 17.2  RPR: Non Reactive Ammonia: 29 Lithium level subtherapeutic: 0.37  Alcohol Level    Component Value Date/Time   ETH <10 07/30/2020 2101   Drugs of Abuse     Component Value Date/Time   LABOPIA NONE DETECTED 07/30/2020 2101   COCAINSCRNUR NONE DETECTED 07/30/2020 2101   LABBENZ NONE DETECTED 07/30/2020 2101   AMPHETMU NONE DETECTED 07/30/2020 2101   THCU NONE DETECTED 07/30/2020 2101   LABBARB NONE DETECTED 07/30/2020 2101    Imaging I have reviewed the images obtained:  CT-scan of the brain: 1. No acute intracranial process.  Preliminary vascular US carotid duplex: Right Carotid: Velocities in the right ICA are consistent with a 1-39% stenosis.  Left Carotid: Velocities in the left ICA are consistent with a 1-39% stenosis.   Vertebrals: Right vertebral artery demonstrates antegrade flow.   MRI examination of the brain pending   Assessment: 65 year old male with history as above who presents for evaluation of progressive aphasia / word finding difficulties - Examination reveals a somnolent patient who does not follow commands and without usable speech other than the words "yes / no" that are used inconsistently by patient.  - CT without acute intracranial abnormality. MRI and ECHO attempted but aborted due to patient agitation and safety concerns.  - Patient with bipolar disease on home lithium. Serum level subtherapeutic on hospital arrival at 0.37 - DDx includes stroke versus psychiatric illness. Full stroke work up pending.  - Low concern for infectious cause of encephalopathy due to patient afebrile with temperature max of 99.2 and without leukocytosis though patient is tachycardic on cardiac monitor- likely to be 2/2 agitation.   Impression: Acute encephalopathy Aphasia Agitation   Recommendations: - Re-attempt MRI with adequate sedation; vessel imaging if MRI brain with acute/subacute abnormality. MRI has been changed to with and without contrast. Include MRA during the exam.  - Obtain ECHO when able  - LDL > 70, initiate / intensify statin therapy for goal LDL < 70 - Stroke prophylaxis: ASA changed to 300 mg PR QD - Frequent neuro checks - Risk factor modification - Telemetry monitoring - PT consult, OT consult, Speech consult - Please notify neurology when neuroimaging is complete - BP management - Will need to be transferred to [facility] for MRI under anesthesia. Transfer to [facility] at 7 AM as will not be able to obtain MRI with anesthesia overnight. Hospitalist team has been notified. - If EEG is positive, Matthew Barnes may need LTM, which also can only be obtained at [facility].  - EEG (ordered) - Avoid Haldol unless restraints are ineffective. Antipsychotics can significantly worsen mentation in patients with  dementia.  Pt seen by NP/Neuro  Anibal Henderson, AGAC-NP [facility] Pager: [phone]  I have seen and examined the patient. I have formulated the assessment and recommendations. 65 year old male with history as above who presents for evaluation of progressive aphasia / word finding difficulties. DDx includes toxic/metabolic and infectious. Matthew Barnes does have an underlying dementia which would make Matthew Barnes more susceptible to a delirium in the context of metabolic derangement, toxic substance, medication overuse or infection. DDx also includes seizure with prolonged postictal state and stroke. Recommendations as above.   Electronically signed: Dr. Kerney Elbe

## 2020-07-31 NOTE — ED Notes (Signed)
Pt ripped out IV and pulled off all monitors. Dr.patel made aware.

## 2020-07-31 NOTE — Plan of Care (Signed)

## 2020-07-31 NOTE — Evaluation (Signed)
SLP Cancellation Note  Patient Details Name: Matthew Barnes MRN: [id number] DOB: 04/22/1955   Cancelled treatment:       Reason Eval/Treat Not Completed: Other (comment) (per chart review, pt with some agitation and unable to complete MRI - now with red MEWS due to sustained tachycardia, will continue efforts.)  Kathleen Lime, MS [place] Office [phone] Pager [phone]   [name] 07/31/2020, 11:35 AM

## 2020-07-31 NOTE — Progress Notes (Addendum)
Chart reviewed, Pt's atypical presentation or deliriums vs confusion could be nms but less likely , no fever, no rigidity, cpk elevation , confusion noted,AM team to get psych/ neurology  on board for eval of confusion as well. We will obtain Mri brain to identify an TIA/CVA, if negative optimize BP control and diuretic for suspected underlying diastolic CHF.lactic is normal.ammonia level pending.

## 2020-07-31 NOTE — ED Notes (Signed)
Per inpatient nurse patient can now come to the room.

## 2020-07-31 NOTE — ED Notes (Signed)
Patient got up from chair, pulled off at equipment and removed his own IV, was bleeding onto the floor.  Bandage applied and patient walked to the bathroom.  Patient back in recliner with alarm in place.  Equipment replaced and new IV placed in left AC. Wrapped to keep patient from removing again. Patient appears confused.  Patient re-educated on using call bell to call for assistance and not removing IV.  Patient verbalized understanding.

## 2020-07-31 NOTE — Plan of Care (Signed)

## 2020-08-01 ENCOUNTER — Other Ambulatory Visit: Payer: Self-pay | Admitting: Endocrinology

## 2020-08-01 ENCOUNTER — Inpatient Hospital Stay (HOSPITAL_COMMUNITY): Payer: Medicare HMO

## 2020-08-01 ENCOUNTER — Inpatient Hospital Stay (HOSPITAL_COMMUNITY)
Admit: 2020-08-01 | Discharge: 2020-08-01 | Disposition: A | Discharge status: Home / Self Care | Payer: Medicare HMO | Attending: Neurology | Admitting: Neurology

## 2020-08-01 DIAGNOSIS — I340 Nonrheumatic mitral (valve) insufficiency: Secondary | ICD-10-CM | POA: Diagnosis not present

## 2020-08-01 DIAGNOSIS — R4701 Aphasia: Secondary | ICD-10-CM | POA: Diagnosis not present

## 2020-08-01 DIAGNOSIS — I10 Essential (primary) hypertension: Secondary | ICD-10-CM | POA: Diagnosis not present

## 2020-08-01 DIAGNOSIS — R4182 Altered mental status, unspecified: Secondary | ICD-10-CM | POA: Diagnosis not present

## 2020-08-01 LAB — CBC
HCT: 38.3 % — ABNORMAL LOW (ref 39.0–52.0)
Hemoglobin: 11.7 g/dL — ABNORMAL LOW (ref 13.0–17.0)
MCH: 25.7 pg — ABNORMAL LOW (ref 26.0–34.0)
MCHC: 30.5 g/dL (ref 30.0–36.0)
MCV: 84 fL (ref 80.0–100.0)
Platelets: 186 10*3/uL (ref 150–400)
RBC: 4.56 MIL/uL (ref 4.22–5.81)
RDW: 14.4 % (ref 11.5–15.5)
WBC: 7.3 10*3/uL (ref 4.0–10.5)
nRBC: 0 % (ref 0.0–0.2)

## 2020-08-01 LAB — COMPREHENSIVE METABOLIC PANEL
ALT: 32 U/L (ref 0–44)
AST: 38 U/L (ref 15–41)
Albumin: 3.6 g/dL (ref 3.5–5.0)
Alkaline Phosphatase: 40 U/L (ref 38–126)
Anion gap: 11 (ref 5–15)
BUN: 14 mg/dL (ref 8–23)
CO2: 24 mmol/L (ref 22–32)
Calcium: 9.1 mg/dL (ref 8.9–10.3)
Chloride: 108 mmol/L (ref 98–111)
Creatinine, Ser: 1.08 mg/dL (ref 0.61–1.24)
GFR, Estimated: >60 mL/min (ref >60)
Glucose, Bld: 114 mg/dL — ABNORMAL HIGH (ref 70–99)
Potassium: 3.8 mmol/L (ref 3.5–5.1)
Sodium: 143 mmol/L (ref 135–145)
Total Bilirubin: 1 mg/dL (ref 0.3–1.2)
Total Protein: 6.5 g/dL (ref 6.5–8.1)

## 2020-08-01 LAB — ECHOCARDIOGRAM LIMITED
Height: 73 in
S' Lateral: 6.1 cm
Weight: 4437.42 oz

## 2020-08-01 LAB — URINE CULTURE: Culture: NO GROWTH

## 2020-08-01 LAB — GLUCOSE, CAPILLARY
Glucose-Capillary: 151 mg/dL — ABNORMAL HIGH (ref 70–99)
Glucose-Capillary: 161 mg/dL — ABNORMAL HIGH (ref 70–99)
Glucose-Capillary: 135 mg/dL — ABNORMAL HIGH (ref 70–99)
Glucose-Capillary: 145 mg/dL — ABNORMAL HIGH (ref 70–99)

## 2020-08-01 LAB — PHOSPHORUS: Phosphorus: 4 mg/dL (ref 2.5–4.6)

## 2020-08-01 LAB — MAGNESIUM: Magnesium: 2 mg/dL (ref 1.7–2.4)

## 2020-08-01 MED ORDER — LITHIUM CARBONATE 300 MG PO CAPS
900.0000 mg | ORAL_CAPSULE | Freq: Every day | ORAL | Status: DC
  Administered 2020-08-01 – 2020-08-09 (×9): 900 mg via ORAL
  Filled 2020-08-01 (×9): qty 3

## 2020-08-01 MED ORDER — QUETIAPINE FUMARATE 25 MG PO TABS
25.0000 mg | ORAL_TABLET | Freq: Two times a day (BID) | ORAL | Status: DC | PRN
  Administered 2020-08-02 – 2020-08-08 (×7): 25 mg via ORAL
  Filled 2020-08-01 (×8): qty 1

## 2020-08-01 NOTE — TOC Progression Note (Addendum)
Transition of Care [facility]) - Progression Note    Patient Details  Name: Matthew Barnes MRN: [id number] Date of Birth: Apr 09, 1955  Transition of [facility]) CM/SW Contact  Purcell Mouton, RN Phone Number: 08/01/2020, 11:07 AM  Clinical Narrative:    TOC reviewed chart. Pt lives with parents.   Expected Discharge Plan: [place] Barriers to Discharge: No Barriers Identified  Expected Discharge Plan and Services Expected Discharge Plan: [place] arrangements for the past 2 months: Single Family Home                                       Social Determinants of Health (SDOH) Interventions    Readmission Risk Interventions No flowsheet data found.

## 2020-08-01 NOTE — Evaluation (Signed)
Occupational Therapy Evaluation Patient Details Name: Matthew Barnes MRN: [id number] DOB: [birth year] Today's Date: 08/01/2020    History of Present Illness 65 year old male presents with difficulty finding workds and completing sentences. PMH significant for bipolar disorder II , diabetes mellitus, hyperlipidemia, hypertension, hypothyroidism. Continued to have altered mental status and agitation.   Clinical Impression   [name] is a 65 year old man who presents with altered mental status and decreased alertness - patient exhibits slow processing, alert to self only, and impaired safety awareness. Patient does not exhibit any focal neurological deficits and functional ROM and strength of upper extremities. Patient requiring increased assistance with ADLs due to altered mental status and min guard/min assist for mobility for safety. Patient only able to tolerate edge of bed activity - standing and using urinal - but falling asleep in sitting. Returned to bed for safety. Expect patient will improve functional abilities when altered mental status improves. Patient states he lives alone and he is independent other documentation states he lives with his parents. Recommend 24/7 supervision and [place] services at discharge at this time.    Follow Up Recommendations  Home health OT;Supervision/Assistance - 24 hour    Equipment Recommendations  None recommended by OT    Recommendations for Other Services       Precautions / Restrictions Precautions Precautions: Fall Restrictions Weight Bearing Restrictions: No      Mobility Bed Mobility Overal bed mobility: Needs Assistance Bed Mobility: Supine to Sit;Sit to Supine     Supine to sit: Min guard Sit to supine: Min guard   General bed mobility comments: min guard and verbal cues for bed mobility - due to patient's impaired cognition and alertness    Transfers Overall transfer level: Needs assistance Equipment used: 2 person hand held  assist Transfers: Sit to/from Stand;Stand Pivot Transfers Sit to Stand: +2 safety/equipment Stand pivot transfers: +2 safety/equipment       General transfer comment: Patient able to stand with min assist and take steps to head of bed. +2 for safety due to patient's decreased alertness and slow processing.    Balance Overall balance assessment: Needs assistance Sitting-balance support: Feet supported Sitting balance-Leahy Scale: Fair Sitting balance - Comments: close min guard   Standing balance support: During functional activity Standing balance-Leahy Scale: Fair                             ADL either performed or assessed with clinical judgement   ADL Overall ADL's : Needs assistance/impaired Eating/Feeding: Set up   Grooming: Set up   Upper Body Bathing: Set up;Cueing for sequencing   Lower Body Bathing: Minimal assistance;Sit to/from stand   Upper Body Dressing : Set up   Lower Body Dressing: Sit to/from stand;Maximal assistance   Toilet Transfer: Minimal assistance;BSC;Stand-pivot   Toileting- Clothing Manipulation and Hygiene: Moderate assistance Toileting - Clothing Manipulation Details (indicate cue type and reason): patient able to use urinal with setup but would need assistance with clothing management             Vision   Vision Assessment?: No apparent visual deficits     Perception     Praxis      Pertinent Vitals/Pain Pain Assessment: No/denies pain     Hand Dominance     Extremity/Trunk Assessment Upper Extremity Assessment Upper Extremity Assessment: Overall WFL for tasks assessed   Lower Extremity Assessment Lower Extremity Assessment: Generalized weakness (not fully  tested due to drowsiness and  potential to fall)   Cervical / Trunk Assessment Cervical / Trunk Assessment: Normal   Communication Communication Communication:  (limited due to drowsiness)   Cognition Arousal/Alertness: Lethargic Behavior During  Therapy: Flat affect Overall Cognitive Status: Impaired/Different from baseline                                 General Comments: Overall slow processing, lethargic/sleepy. limited ability to gain baseline for cognitive due to drowsiness   General Comments       Exercises     Shoulder Instructions      Home Living Family/patient expects to be discharged to:: Private residence     Type of Home: Apartment Home Access: Level entry     Home Layout: One level     Bathroom Shower/Tub: Tub/shower unit         Home Equipment: None          Prior Functioning/Environment Level of Independence: Independent        Comments: Patient is currently an unreliable historian. Will need to validate today's report.        OT Problem List: Decreased activity tolerance;Impaired balance (sitting and/or standing);Decreased safety awareness;Decreased cognition      OT Treatment/Interventions: Self-care/ADL training;DME and/or AE instruction;Therapeutic activities;Cognitive remediation/compensation;Patient/family education;Balance training    OT Goals(Current goals can be found in the care plan section) Acute Rehab OT Goals Patient Stated Goal: unable OT Goal Formulation: Patient unable to participate in goal setting Time For Goal Achievement: 08/15/20 Potential to Achieve Goals: Good  OT Frequency: Min 2X/week   Barriers to D/C:            Co-evaluation              AM-PAC OT "6 Clicks" Daily Activity     Outcome Measure Help from another person eating meals?: A Little Help from another person taking care of personal grooming?: A Little Help from another person toileting, which includes using toliet, bedpan, or urinal?: A Lot Help from another person bathing (including washing, rinsing, drying)?: A Lot Help from another person to put on and taking off regular upper body clothing?: A Little Help from another person to put on and taking off regular lower  body clothing?: A Lot 6 Click Score: 15   End of Session Equipment Utilized During Treatment: Rolling walker Nurse Communication: Mobility status  Activity Tolerance: Patient tolerated treatment well Patient left: in bed;with call bell/phone within reach;with nursing/sitter in room  OT Visit Diagnosis: Unsteadiness on feet (R26.81);Other symptoms and signs involving cognitive function                Time: 9937-1696 OT Time Calculation (min): 16 min Charges:  OT General Charges $OT Visit: 1 Visit OT Evaluation $OT Eval Low Complexity: 1 Low  Sadako Cegielski, OTR/L [place]  Office [phone] Pager: [place] 08/01/2020, 1:11 PM

## 2020-08-01 NOTE — Progress Notes (Signed)
PROGRESS NOTE    Matthew Barnes  [id number] DOB: 11/23/1955 DOA: 07/30/2020 PCP: System, Provider Not In   Brief Narrative: This 65 years old male with PMH significant for bipolar disorder II , diabetes mellitus, hyperlipidemia, hypertension, hypothyroidism presented in the ED with difficulty finding words and completing sentences.  History is limited due to patient's inability to complete sentence.  Patient has significant history of depression and bipolar and being managed by psychiatrist Dr. Quillian Quince.  Patient was slightly aphasic but there is no focal neurological deficits noted on exam. In the ED ethanol level is less than 10,  CT head without no acute intracranial abnormality,  urine drug screen is negative.  MRI, echo and carotid duplex is ordered.  CTA chest : No evidence of PE, seems not conclusive but showed mild infiltrate. Patient has been agitated and restless,  not cooperative to have an MRI. Neurology is consulted, recommended complete stroke work-up.  MRI was attempted with sedation twice but patient was restless and agitated.  Neurology recommended transfer to [facility] for MRI under anesthesia and EEG if positive may require long-term EEG.  Patient is having delirium.  Psychiatry is consulted.   Assessment & Plan:   Principal Problem:   Aphasia Active Problems:   Hypertension associated with diabetes ([place])   Hyperlipidemia   Type 2 diabetes mellitus with diabetic neuropathy, with long-term current use of insulin (HCC)   Esophageal reflux   Bipolar affective, mixed ([place])   AMS (altered mental status)   Aphasia/ Word finding difficulty:  Patient presented with difficulty with finding words and completing sentences. Exact onset of symptoms unknown.  Patient does have history of bipolar disorder II.  Differential diagnoses include speech disorder due to underlying psychiatric disorder or CVA. CT head negative for any acute abnormalities. Continue aspirin and statin. Obtain  MRI, echocardiogram, carotid duplex. Neurology consulted, recommended complete stroke workup. Rule out reversible causes of memory impairment, TSH, B12, Folate, RPR MRI was attempted twice with sedation and Haldol. Patient has been restless and agitated, MRI is pending. Neurology recommended transfer to [facility] for MRI under anesthesia. EEG if positive patient may require long-term EEG which is possible it [employer]. Neurochecks every 4 to 6 hours. Carotid duplex: No significant stenosis noted.  Hypertension:  Home blood pressure medications are on hold to allow permissive hypertension. Resume Home BP when stable.  Hyperlipidemia  : Continue Lipitor.  Diabetes mellitus type 2 :  Continue Levemir 10 units bid and sliding scale  Sinus tachycardia:  CTA chest was completed, shows artifacts. Not conclusive. Started metoprolol 5 mg Iv q8hr prn. CT chest shows mild infiltrate. Patient was started on ceftriaxone and Zithromax. Procalcitonin normal.  Consider discontinuing antibiotic if no leukocytosis.   Bipolar disorder : Continue Seroquel. Psychiatry consulted, stated patient is having delirium. Advised to dc Haldol started Seroquel 25 mg daily. Continue one-to-one observation. Resume Lithium   Elevated Trop : Trop remained flat could be due to demand ischemia. Patient denies chest pain. Trop 32 > 33> 34   DVT prophylaxis: Heparin Code Status: Full code. Family Communication:No family at bed side. Disposition Plan:   Status is: Inpatient  Remains inpatient appropriate because:Inpatient level of care appropriate due to severity of illness   Dispo: The patient is from: Home              Anticipated d/c is to: Home              Patient currently is not medically stable  to d/c.   Difficult to place patient No   Consultants:     Neurology , Psychiatry  Procedures: CT head, echo, MRI. Antimicrobials: Anti-infectives (From admission, onward)   Start     Dose/Rate  Route Frequency Ordered Stop   07/31/20 0400  cefTRIAXone (ROCEPHIN) 1 g in sodium chloride 0.9 % 100 mL IVPB        1 g 200 mL/hr over 30 Minutes Intravenous  Once 07/31/20 0355 07/31/20 0545   07/31/20 0400  azithromycin (ZITHROMAX) 500 mg in sodium chloride 0.9 % 250 mL IVPB        500 mg 250 mL/hr over 60 Minutes Intravenous Every 24 hours 07/31/20 0355        Subjective: Patient was seen and examined at bedside.  Overnight events noted.   Patient is alert and oriented x 1(oriented to name, not to time and location) denies any pain. He is lying comfortably on the bed, stated yes to all the questions asked.  MRI is pending.  Objective: Vitals:   07/31/20 1522 07/31/20 1726 07/31/20 2149 08/01/20 0123  BP: (!) 146/100 (!) 152/92 (!) 150/91 127/89  Pulse: (!) 106 (!) 105 98 96  Resp: 18 (!) 21 (!) 24 (!) 21  Temp: 98 F (36.7 C)  97.6 F (36.4 C) 98 F (36.7 C)  TempSrc: Oral Oral Oral Oral  SpO2: 93%  94% 92%  Weight: 125.8 kg     Height: 6\' 1"  (1.854 m)       Intake/Output Summary (Last 24 hours) at 08/01/2020 1251 Last data filed at 08/01/2020 1200 Gross per 24 hour  Intake 440 ml  Output 600 ml  Net -160 ml   Filed Weights   07/30/20 2029 07/31/20 1522  Weight: 132.2 kg 125.8 kg    Examination:  General exam: Appears calm, comfortable , slight fidgety .  He is not cooperative. Respiratory system: Clear to auscultation. Respiratory effort normal. Cardiovascular system: S1 & S2 heard, RRR. No JVD, murmurs, rubs, gallops or clicks. No pedal edema. Gastrointestinal system: Abdomen is nondistended, soft and nontender. No organomegaly or masses felt.   Normal bowel sounds heard. Central nervous system: Alert and oriented. No focal neurological deficits. Extremities: All 4 extremities.  No focal neurological deficits. Skin: No rashes, lesions or ulcers Psychiatry: Not completed    Data Reviewed: I have personally reviewed following labs and imaging  studies  CBC: Recent Labs  Lab 07/30/20 2101 07/31/20 0643 08/01/20 0344  WBC 8.4 8.8 7.3  NEUTROABS 5.5  --   --   HGB 12.2* 12.5* 11.7*  HCT 40.3 40.7 38.3*  MCV 83.1 82.7 84.0  PLT 203 224 [id number]   Basic Metabolic Panel: Recent Labs  Lab 07/30/20 2101 07/30/20 2120 07/31/20 0643 08/01/20 0344  NA 138  --  143 143  K 3.8  --  3.9 3.8  CL 106  --  107 108  CO2 23  --  24 24  GLUCOSE 207*  --  241* 114*  BUN 14  --  16 14  CREATININE 1.24  --  1.18 1.08  CALCIUM 9.1  --  9.6 9.1  MG  --  1.9 2.1 2.0  PHOS  --  3.5 3.5 4.0   GFR: Estimated Creatinine Clearance: 96.1 mL/min (by C-G formula based on SCr of 1.08 mg/dL). Liver Function Tests: Recent Labs  Lab 07/30/20 2101 07/31/20 0643 08/01/20 0344  AST 33 35 38  ALT 30 33 32  ALKPHOS 49 50 40  BILITOT 1.2 1.0 1.0  PROT 7.3 7.2 6.5  ALBUMIN 4.0 4.1 3.6   No results for input(s): LIPASE, AMYLASE in the last 168 hours. Recent Labs  Lab 07/31/20 0740  AMMONIA 29   Coagulation Profile: No results for input(s): INR, PROTIME in the last 168 hours. Cardiac Enzymes: Recent Labs  Lab 07/31/20 0127 07/31/20 0643  CKTOTAL 404* 417*   BNP (last 3 results) No results for input(s): PROBNP in the last 8760 hours. HbA1C: Recent Labs    07/31/20 0409  HGBA1C 8.3*   CBG: Recent Labs  Lab 07/31/20 1159 07/31/20 1739 07/31/20 2152 08/01/20 0803 08/01/20 1224  GLUCAP 175* 144* 152* 151* 161*   Lipid Profile: Recent Labs    07/31/20 0409  CHOL 116  HDL 24*  LDLCALC 79  TRIG 65  CHOLHDL 4.8   Thyroid Function Tests: No results for input(s): TSH, T4TOTAL, FREET4, T3FREE, THYROIDAB in the last 72 hours. Anemia Panel: Recent Labs    07/31/20 0740  VITAMINB12 397   Sepsis Labs: Recent Labs  Lab 07/31/20 0127 07/31/20 0409 07/31/20 1702  PROCALCITON  --   --  <0.10  LATICACIDVEN 1.4 1.4  --     Recent Results (from the past 240 hour(s))  Culture, Urine     Status: None   Collection Time:  07/30/20  9:00 PM   Specimen: Urine, Random  Result Value Ref Range Status   Specimen Description   Final    URINE, RANDOM Performed at [facility], [place] [address] [place], [place] [address]    Special Requests   Final    NONE Performed at [facility], [place] [address] [place], [place] [address]    Culture   Final    NO GROWTH Performed at [facility], [place] [address] [place], [place] [address]    Report Status 08/01/2020 FINAL  Final  Resp Panel by RT-PCR (Flu A&B, Covid) Nasopharyngeal Swab     Status: None   Collection Time: 07/30/20 11:15 PM   Specimen: Nasopharyngeal Swab; Nasopharyngeal(NP) swabs in vial transport medium  Result Value Ref Range Status   SARS Coronavirus 2 by RT PCR NEGATIVE NEGATIVE Final    Comment: (NOTE) SARS-CoV-2 target nucleic acids are NOT DETECTED.  The SARS-CoV-2 RNA is generally detectable in upper respiratory specimens during the acute phase of infection. The lowest concentration of SARS-CoV-2 viral copies this assay can detect is 138 copies/mL. A negative result does not preclude SARS-Cov-2 infection and should not be used as the sole basis for treatment or other patient management decisions. A negative result may occur with  improper specimen collection/handling, submission of specimen other than nasopharyngeal swab, presence of viral mutation(s) within the areas targeted by this assay, and inadequate number of viral copies(<138 copies/mL). A negative result must be combined with clinical observations, patient history, and epidemiological information. The expected result is Negative.  Fact Sheet for Patients:  EntrepreneurPulse.com.au  Fact Sheet for Healthcare Providers:  IncredibleEmployment.be  This test is no t yet approved or cleared by the [place] FDA and  has been authorized for detection and/or diagnosis of SARS-CoV-2 by FDA under an  Emergency Use Authorization (EUA). This EUA will remain  in effect (meaning this test can be used) for the duration of the COVID-19 declaration under Section 564(b)(1) of the Act, 21 U.S.C.section 360bbb-3(b)(1), unless the authorization is terminated  or revoked sooner.       Influenza A by PCR NEGATIVE NEGATIVE Final  Influenza B by PCR NEGATIVE NEGATIVE Final    Comment: (NOTE) The Xpert Xpress SARS-CoV-2/FLU/RSV plus assay is intended as an aid in the diagnosis of influenza from Nasopharyngeal swab specimens and should not be used as a sole basis for treatment. Nasal washings and aspirates are unacceptable for Xpert Xpress SARS-CoV-2/FLU/RSV testing.  Fact Sheet for Patients: EntrepreneurPulse.com.au  Fact Sheet for Healthcare Providers: IncredibleEmployment.be  This test is not yet approved or cleared by the [place] FDA and has been authorized for detection and/or diagnosis of SARS-CoV-2 by FDA under an Emergency Use Authorization (EUA). This EUA will remain in effect (meaning this test can be used) for the duration of the COVID-19 declaration under Section 564(b)(1) of the Act, 21 U.S.C. section 360bbb-3(b)(1), unless the authorization is terminated or revoked.  Performed at [facility], [place] [address], [place], [place] [address]   Culture, blood (Routine X 2) w Reflex to ID Panel     Status: None (Preliminary result)   Collection Time: 07/31/20  1:27 AM   Specimen: BLOOD  Result Value Ref Range Status   Specimen Description   Final    BLOOD LEFT ANTECUBITAL Performed at [place] [address], [place], [place] [address]    Special Requests   Final    BOTTLES DRAWN AEROBIC AND ANAEROBIC Blood Culture adequate volume Performed at [place] [address] [place], [place] [address]    Culture   Final    NO GROWTH 1 DAY Performed at [facility],  [place] [address], [place], [place] [address]    Report Status PENDING  Incomplete  Culture, blood (Routine X 2) w Reflex to ID Panel     Status: None (Preliminary result)   Collection Time: 07/31/20  4:09 AM   Specimen: BLOOD  Result Value Ref Range Status   Specimen Description   Final    BLOOD RIGHT ANTECUBITAL Performed at [place] [address] [place], [place] [address]    Special Requests   Final    BOTTLES DRAWN AEROBIC AND ANAEROBIC Blood Culture results may not be optimal due to an inadequate volume of blood received in culture bottles Performed at [place] [address], [place], [place] [address]    Culture   Final    NO GROWTH 1 DAY Performed at [facility], [place] [address], [place],  [address]    Report Status PENDING  Incomplete    Radiology Studies: DG Chest 2 View  Result Date: 07/30/2020 CLINICAL DATA:  Cough EXAM: CHEST - 2 VIEW COMPARISON:  05/21/2020 FINDINGS: The heart size and mediastinal contours are within normal limits. Both lungs are clear. The visualized skeletal structures are unremarkable. IMPRESSION: No active cardiopulmonary disease. Electronically Signed   By: Ulyses Jarred M.D.   On: 07/30/2020 21:06   CT Head Wo Contrast  Result Date: 07/30/2020 CLINICAL DATA:  Neurologic deficit, worsening depression EXAM: CT HEAD WITHOUT CONTRAST TECHNIQUE: Contiguous axial images were obtained from the base of the skull through the vertex without intravenous contrast. COMPARISON:  07/13/2019, 05/13/2010 FINDINGS: Brain: No acute infarct or hemorrhage. Lateral ventricles and midline structures are unremarkable. No acute extra-axial fluid collections. No mass effect. Vascular: No hyperdense vessel or unexpected calcification. Skull: Normal. Negative for fracture or focal lesion. Sinuses/Orbits: Mild mucoperiosteal thickening of the right maxillary and left sphenoid sinuses. Other: None. IMPRESSION: 1. No acute  intracranial process. Electronically Signed   By: [name]  Owens Shark M.D.   On: 07/30/2020 23:26   CT ANGIO CHEST PE W OR WO CONTRAST  Result Date: 07/31/2020 CLINICAL DATA:  Cough. EXAM: CT ANGIOGRAPHY CHEST WITH CONTRAST TECHNIQUE: Multidetector CT imaging of the chest was performed using the standard protocol during bolus administration of intravenous contrast. Multiplanar CT image reconstructions and MIPs were obtained to evaluate the vascular anatomy. CONTRAST:  134mL OMNIPAQUE IOHEXOL 350 MG/ML SOLN COMPARISON:  None. FINDINGS: The study is markedly limited secondary to patient motion and the inability of the patient to raise his arms up during the exam. As result, motion artifact and beam hardening artifact are seen. Cardiovascular: There is very mild calcification of the aortic arch without evidence of aortic aneurysm. The subsegmental pulmonary arteries are limited in evaluation secondary to suboptimal opacification with intravenous contrast. Motion artifact is also seen involving the segmental and subsegmental pulmonary arteries. Ill-defined areas of intraluminal low attenuation are seen involving multiple upper lobe and lower lobe branches of the bilateral pulmonary arteries. There is mild to moderate severity cardiomegaly. No pericardial effusion. Mediastinum/Nodes: Multiple subcentimeter pretracheal and AP window lymph nodes are seen. Thyroid gland, trachea, and esophagus demonstrate no significant findings. Lungs/Pleura: Mild atelectasis and/or infiltrate is seen within the posterior aspect of the left lung base. There is no evidence of a pleural effusion or pneumothorax. Upper Abdomen: No acute abnormality. Musculoskeletal: No chest wall abnormality. No acute or significant osseous findings. Review of the MIP images confirms the above findings. IMPRESSION: 1. Markedly limited study with areas of intraluminal low attenuation involving bilateral upper lobe and lower lobe branches of the pulmonary  arteries. While this may be secondary to extensive areas of artifact, bilateral pulmonary embolism cannot completely be excluded. Additional evaluation with a nuclear medicine ventilation/perfusion scan or follow-up chest CTA is recommended. 2. Mild left basilar atelectasis and/or infiltrate. Electronically Signed   By: Virgina Norfolk M.D.   On: 07/31/2020 03:02   VAS US CAROTID (at [facility] and [facility] only)  Result Date: 07/31/2020 Carotid Arterial Duplex Study Patient Name:  Matthew Barnes  Date of Exam:   07/31/2020 Medical Rec #: [id number]     Accession #:    [id number] Date of Birth: [birth year]    Patient Gender: M Patient Age:   64Y Exam Location:  [facility] Procedure:      VAS US CAROTID Referring Phys: [id number] [place] --------------------------------------------------------------------------------  Indications:       Aphasia. Risk Factors:      Hypertension, hyperlipidemia, Diabetes. Limitations        Today's exam was limited due to the body habitus of the                    patient, the patient's respiratory variation and patient                    agitation, constant patient movement. Comparison Study:  No prior studies. Performing Technologist: Oliver Hum RVT  Examination Guidelines: A complete evaluation includes B-mode imaging, spectral Doppler, color Doppler, and power Doppler as needed of all accessible portions of each vessel. Bilateral testing is considered an integral part of a complete examination. Limited examinations for reoccurring indications may be performed as noted.  Right Carotid Findings: +----------+--------+--------+--------+-----------------------+--------+           PSV cm/sEDV cm/sStenosisPlaque Description     Comments +----------+--------+--------+--------+-----------------------+--------+ CCA Prox  84      16  smooth and heterogenoustortuous +----------+--------+--------+--------+-----------------------+--------+ CCA Distal43      8                smooth and heterogenous         +----------+--------+--------+--------+-----------------------+--------+ ICA Prox  43      13              smooth and heterogenous         +----------+--------+--------+--------+-----------------------+--------+ ICA Distal67      23                                     tortuous +----------+--------+--------+--------+-----------------------+--------+ ECA       52      8                                               +----------+--------+--------+--------+-----------------------+--------+ +----------+--------+-------+--------+-------------------+           PSV cm/sEDV cmsDescribeArm Pressure (mmHG) +----------+--------+-------+--------+-------------------+ [id number]                                        +----------+--------+-------+--------+-------------------+ +---------+--------+--+--------+-+---------+ VertebralPSV cm/s30EDV cm/s9Antegrade +---------+--------+--+--------+-+---------+  Left Carotid Findings: +----------+--------+--------+--------+-----------------------+--------+           PSV cm/sEDV cm/sStenosisPlaque Description     Comments +----------+--------+--------+--------+-----------------------+--------+ CCA Prox  70      9               smooth and heterogenous         +----------+--------+--------+--------+-----------------------+--------+ CCA Distal55      12              smooth and heterogenous         +----------+--------+--------+--------+-----------------------+--------+ ICA Prox  54      15              smooth and heterogenoustortuous +----------+--------+--------+--------+-----------------------+--------+ ICA Distal54      14                                     tortuous +----------+--------+--------+--------+-----------------------+--------+ ECA       66      9                                                +----------+--------+--------+--------+-----------------------+--------+ Unable to obtain vertebral and subclavian artery images due to patient agitation.  Summary: Right Carotid: Velocities in the right ICA are consistent with a 1-39% stenosis. Left Carotid: Velocities in the left ICA are consistent with a 1-39% stenosis. Vertebrals: Right vertebral artery demonstrates antegrade flow. *See table(s) above for measurements and observations.  Electronically signed by Curt Jews MD on 07/31/2020 at 8:20:50 PM.    Final     Scheduled Meds: .  stroke: mapping our early stages of recovery book   Does not apply Once  . aspirin  300 mg Rectal Daily  . gatifloxacin  1 drop Both Eyes QID  . heparin  5,000 Units Subcutaneous Q8H  . insulin aspart  0-15 Units Subcutaneous TID WC  .  insulin aspart  0-5 Units Subcutaneous QHS  . insulin detemir  10 Units Subcutaneous BID  . pantoprazole (PROTONIX) IV  40 mg Intravenous Q12H  . QUEtiapine  400 mg Oral QHS  . sodium chloride flush  3 mL Intravenous Q12H  . thiamine injection  100 mg Intravenous Daily   Continuous Infusions: . sodium chloride    . azithromycin 500 mg (08/01/20 0529)     LOS: 1 day    Time spent: 25 mins    Lalanya Rufener, MD [facility]   If 7PM-7AM, please contact night-coverage

## 2020-08-01 NOTE — Evaluation (Signed)
Physical Therapy Evaluation Patient Details Name: Matthew Barnes MRN: [id number] DOB: 12/25/1955 Today's Date: 08/01/2020   History of Present Illness  65 year old male presents with difficulty finding workds and completing sentences. PMH significant for bipolar disorder II , diabetes mellitus, hyperlipidemia, hypertension, hypothyroidism. Continued to have altered mental status and agitation.  Clinical Impression  The patient was f=very flat and drowsy, frequent stimulation to stay aroused. Did not ambulate due to safety concerns.  Incidentally, ~ 1 hour later, patient observed ambulating in hall using /rw with CNA, min guard.  Pt admitted with above diagnosis.  Pt currently with functional limitations due to the deficits listed below (see PT Problem List). Pt will benefit from skilled PT to increase their independence and safety with mobility to allow discharge to the venue listed below.       Follow Up Recommendations  (TBD, depending progress)    Equipment Recommendations  Rolling walker with 5" wheels    Recommendations for Other Services       Precautions / Restrictions Precautions Precautions: Fall Restrictions Weight Bearing Restrictions: No      Mobility  Bed Mobility Overal bed mobility: Needs Assistance Bed Mobility: Supine to Sit;Sit to Supine     Supine to sit: Min guard Sit to supine: Min guard   General bed mobility comments: min guard and verbal cues for bed mobility - due to patient's impaired cognition and alertness    Transfers Overall transfer level: Needs assistance Equipment used: 2 person hand held assist Transfers: Sit to/from Stand;Stand Pivot Transfers Sit to Stand: +2 safety/equipment Stand pivot transfers: +2 safety/equipment       General transfer comment: Patient able to stand with min assist and take steps to head of bed. +2 for safety due to patient's decreased alertness and slow processing.  Ambulation/Gait             General  Gait Details: NT due to drowsiness  Stairs            Wheelchair Mobility    Modified Rankin (Stroke Patients Only)       Balance Overall balance assessment: Needs assistance Sitting-balance support: Feet supported Sitting balance-Leahy Scale: Fair Sitting balance - Comments: close min guard   Standing balance support: During functional activity Standing balance-Leahy Scale: Fair                               Pertinent Vitals/Pain Pain Assessment: No/denies pain    Home Living Family/patient expects to be discharged to:: Private residence     Type of Home: Apartment Home Access: Level entry     Home Layout: One level Home Equipment: None      Prior Function Level of Independence: Independent         Comments: Patient is currently an unreliable historian. Will need to validate today's report.     Hand Dominance        Extremity/Trunk Assessment   Upper Extremity Assessment Upper Extremity Assessment: Overall WFL for tasks assessed    Lower Extremity Assessment Lower Extremity Assessment: Generalized weakness (not fully tested due to drowsiness and  potential to fall)    Cervical / Trunk Assessment Cervical / Trunk Assessment: Normal  Communication   Communication:  (limited due to drowsiness)  Cognition Arousal/Alertness: Lethargic Behavior During Therapy: Flat affect Overall Cognitive Status: Impaired/Different from baseline  General Comments: Overall slow processing, lethargic/sleepy. limited ability to gain baseline for cognitive due to drowsiness      General Comments      Exercises     Assessment/Plan    PT Assessment Patient needs continued PT services  PT Problem List Decreased mobility;Decreased safety awareness;Decreased cognition;Decreased activity tolerance;Decreased knowledge of use of DME       PT Treatment Interventions DME instruction;Therapeutic  activities;Cognitive remediation;Gait training;Therapeutic exercise;Patient/family education;Functional mobility training;Balance training    PT Goals (Current goals can be found in the Care Plan section)  Acute Rehab PT Goals Patient Stated Goal: none stated PT Goal Formulation: Patient unable to participate in goal setting Time For Goal Achievement: 08/15/20 Potential to Achieve Goals: Good    Frequency Min 3X/week   Barriers to discharge        Co-evaluation PT/OT/SLP Co-Evaluation/Treatment: Yes             AM-PAC PT "6 Clicks" Mobility  Outcome Measure Help needed turning from your back to your side while in a flat bed without using bedrails?: A Little Help needed moving from lying on your back to sitting on the side of a flat bed without using bedrails?: A Little Help needed moving to and from a bed to a chair (including a wheelchair)?: A Lot Help needed standing up from a chair using your arms (e.g., wheelchair or bedside chair)?: A Lot Help needed to walk in hospital room?: A Lot Help needed climbing 3-5 steps with a railing? : A Lot 6 Click Score: 14    End of Session   Activity Tolerance: Patient limited by lethargy Patient left: in bed;with call bell/phone within reach;with nursing/sitter in room Nurse Communication: Mobility status PT Visit Diagnosis: Difficulty in walking, not elsewhere classified (R26.2)    Time: 4503-8882 PT Time Calculation (min) (ACUTE ONLY): 10 min   Charges:   PT Evaluation $PT Eval Low Complexity: 1 Low          Tresa Endo PT Acute Rehabilitation Services Pager [phone] Office [phone]   Claretha Cooper 08/01/2020, 1:10 PM

## 2020-08-01 NOTE — Consult Note (Signed)
HPI: This 65 years old male with PMH significant for bipolar disorder, diabetes, hyperlipidemia, hypertension, hypothyroidism presented in the ED with difficulty finding words and completing sentences.  History is limited due to patient's inability to complete sentence.  Patient has significant history of depression and bipolar and being managed by psychiatrist Dr. Quillian Quince.  Patient was slightly aphasic but there is no focal neurological deficits noted on exam. In the ED ethanol level is less than 10,  CT head is no acute intracranial abnormality,  urine drug screen is negative.  MRI, echo and carotid duplex is ordered.  CTA chest No evidence of PE, seems not conclusive but showed mild infiltrate.. Patient has been agitated and restless,  not cooperative to have an MRI.  Psych consult placed for agitation, hallucinations, anxiety and depression.  Patient unable to participate in psychiatric evaluation at this time. Currently receiving extensive neurological workup with plans to transfer to [facility] for MRI under anesthesia. Chart review indicates compliance with psychiatric medication and appropriate follow up. Chart review further indicates patient has presented with parkinsonian tremors, poor balance, lack of coordination, dysphagia, and memory loss impairment.   Recommendations:  Patient at risk for developing Delirium, initiate delirium precautions.  Resume Lithium for management of Bipolar II disorder.  Will DC Prn Haldol. Recommend avoiding use of multiple antipsychotics.  Will add Seroquel 25mg  po BID prn for agitation and confusion.   Continue 1:1 safety sitter Please re-consult once full stroke and neurologic workup are completed, considering his current presentation is likely related to neurodegenerative disease.

## 2020-08-01 NOTE — Progress Notes (Signed)
*  PRELIMINARY RESULTS* Echocardiogram 2D Echocardiogram has been performed.  Luisa Hart RDCS 08/01/2020, 2:54 PM

## 2020-08-01 NOTE — Progress Notes (Signed)
EEG Completed; Results Pending  

## 2020-08-01 NOTE — Plan of Care (Signed)

## 2020-08-01 NOTE — Progress Notes (Addendum)
Patient early this am was agitated, walking down the hall with just blanket on, not cooperating with staff. Wanting to "leave". Convinced pt to return to room for breakfast and wait for Md to discharge if possible. Oriented to self only. Being belligerent at times and cursing. No physical threats. Throughout shift he has been sleepy, restless, and non-compliant with meds at times. Unable to fully do neuro checks due to being uncooperative.  Awaiting transfer to [facility] per order.Eulas Post, RN

## 2020-08-01 NOTE — Evaluation (Signed)
SLP Cancellation Note  Patient Details Name: Matthew Barnes MRN: [id number] DOB: 1955/06/19   Cancelled treatment:       Reason Eval/Treat Not Completed: Other (comment) (RN reports pt has been agitated this am, Psychiatry states pt is having delirium, and plan is for pt to go to [facility] for sedated MRI and prolonged EEG. Will follow up for evaluation if indicated depending on findings of imaging. Thanks.)  Kathleen Lime, MS [facility] SLP Acute Rehab Services Office [phone] Pager [place], [place] 08/01/2020, 12:54 PM

## 2020-08-01 NOTE — Progress Notes (Signed)
Patient is being transferred to [facility] for sedated MRI and admission here; will do EEG once patient arrives and as schedule permits.

## 2020-08-01 NOTE — Procedures (Addendum)
Patient Name: Matthew Barnes  MRN: [id number]  Epilepsy Attending: [name]  Referring Physician/Provider: Dr Kerney Elbe Date: 08/01/2020 Duration: 25.13 mins  Patient history: 65yo M with speech disturbance and ams. EEG to evaluate for seizure.  Level of alertness: Awake  AEDs during EEG study: None  Technical aspects: This EEG study was done with scalp electrodes positioned according to the 10-20 International system of electrode placement. Electrical activity was acquired at a sampling rate of 500Hz  and reviewed with a high frequency filter of 70Hz  and a low frequency filter of 1Hz . EEG data were recorded continuously and digitally stored.   Description: The posterior dominant rhythm consists of 7 Hz activity of moderate voltage (25-35 uV) seen predominantly in posterior head regions, symmetric and reactive to eye opening and eye closing. EEG showed continuous generalized 5 to 6 Hz theta slowing.  Hyperventilation and photic stimulation were not performed.     ABNORMALITY - Continuous slow, generalized - Background slow  IMPRESSION: This study is suggestive of moderate diffuse encephalopathy, nonspecific etiology. No seizures or epileptiform discharges were seen throughout the recording.  Matthew Barnes

## 2020-08-02 DIAGNOSIS — E7800 Pure hypercholesterolemia, unspecified: Secondary | ICD-10-CM

## 2020-08-02 DIAGNOSIS — I420 Dilated cardiomyopathy: Secondary | ICD-10-CM

## 2020-08-02 DIAGNOSIS — E1159 Type 2 diabetes mellitus with other circulatory complications: Secondary | ICD-10-CM

## 2020-08-02 DIAGNOSIS — R4701 Aphasia: Secondary | ICD-10-CM

## 2020-08-02 DIAGNOSIS — I152 Hypertension secondary to endocrine disorders: Secondary | ICD-10-CM

## 2020-08-02 LAB — PHOSPHORUS: Phosphorus: 4.7 mg/dL — ABNORMAL HIGH (ref 2.5–4.6)

## 2020-08-02 LAB — GLUCOSE, CAPILLARY
Glucose-Capillary: 158 mg/dL — ABNORMAL HIGH (ref 70–99)
Glucose-Capillary: 188 mg/dL — ABNORMAL HIGH (ref 70–99)
Glucose-Capillary: 193 mg/dL — ABNORMAL HIGH (ref 70–99)
Glucose-Capillary: 180 mg/dL — ABNORMAL HIGH (ref 70–99)
Glucose-Capillary: 150 mg/dL — ABNORMAL HIGH (ref 70–99)

## 2020-08-02 LAB — CBC
HCT: 36.4 % — ABNORMAL LOW (ref 39.0–52.0)
Hemoglobin: 10.8 g/dL — ABNORMAL LOW (ref 13.0–17.0)
MCH: 24.8 pg — ABNORMAL LOW (ref 26.0–34.0)
MCHC: 29.7 g/dL — ABNORMAL LOW (ref 30.0–36.0)
MCV: 83.5 fL (ref 80.0–100.0)
Platelets: 173 10*3/uL (ref 150–400)
RBC: 4.36 MIL/uL (ref 4.22–5.81)
RDW: 14.2 % (ref 11.5–15.5)
WBC: 5.5 10*3/uL (ref 4.0–10.5)
nRBC: 0 % (ref 0.0–0.2)

## 2020-08-02 LAB — BASIC METABOLIC PANEL
Anion gap: 10 (ref 5–15)
BUN: 18 mg/dL (ref 8–23)
CO2: 24 mmol/L (ref 22–32)
Calcium: 8.8 mg/dL — ABNORMAL LOW (ref 8.9–10.3)
Chloride: 109 mmol/L (ref 98–111)
Creatinine, Ser: 1.38 mg/dL — ABNORMAL HIGH (ref 0.61–1.24)
GFR, Estimated: 57 mL/min — ABNORMAL LOW (ref >60)
Glucose, Bld: 136 mg/dL — ABNORMAL HIGH (ref 70–99)
Potassium: 3.7 mmol/L (ref 3.5–5.1)
Sodium: 143 mmol/L (ref 135–145)

## 2020-08-02 LAB — MAGNESIUM: Magnesium: 2 mg/dL (ref 1.7–2.4)

## 2020-08-02 MED ORDER — THIAMINE HCL 100 MG PO TABS
100.0000 mg | ORAL_TABLET | Freq: Every day | ORAL | Status: DC
  Administered 2020-08-02 – 2020-08-09 (×8): 100 mg via ORAL
  Filled 2020-08-02 (×8): qty 1

## 2020-08-02 MED ORDER — GUAIFENESIN ER 600 MG PO TB12
600.0000 mg | ORAL_TABLET | Freq: Two times a day (BID) | ORAL | Status: DC
  Administered 2020-08-02 – 2020-08-09 (×14): 600 mg via ORAL
  Filled 2020-08-02 (×15): qty 1

## 2020-08-02 MED ORDER — AZITHROMYCIN 250 MG PO TABS
500.0000 mg | ORAL_TABLET | Freq: Every day | ORAL | Status: DC
  Administered 2020-08-02 – 2020-08-03 (×2): 500 mg via ORAL
  Filled 2020-08-02 (×4): qty 2

## 2020-08-02 MED ORDER — DM-GUAIFENESIN ER 30-600 MG PO TB12
1.0000 | ORAL_TABLET | Freq: Two times a day (BID) | ORAL | Status: DC
  Filled 2020-08-02: qty 1

## 2020-08-02 MED ORDER — PANTOPRAZOLE SODIUM 40 MG PO TBEC
40.0000 mg | DELAYED_RELEASE_TABLET | Freq: Two times a day (BID) | ORAL | Status: DC
  Administered 2020-08-02 – 2020-08-09 (×13): 40 mg via ORAL
  Filled 2020-08-02 (×13): qty 1

## 2020-08-02 MED ORDER — CARVEDILOL 3.125 MG PO TABS
3.1250 mg | ORAL_TABLET | Freq: Two times a day (BID) | ORAL | Status: DC
  Administered 2020-08-02 – 2020-08-03 (×2): 3.125 mg via ORAL
  Filled 2020-08-02 (×2): qty 1

## 2020-08-02 NOTE — Progress Notes (Signed)
Physical Therapy Treatment Patient Details Name: Matthew Barnes MRN: [id number] DOB: 20-Jul-1955 Today's Date: 08/02/2020    History of Present Illness 65 year old male presents with difficulty finding words and completing sentences. CT head without no acute intracranial abnormality. PMH: bipolar disorder II , diabetes mellitus, hyperlipidemia, hypertension, hypothyroidism.    PT Comments    Pt asleep upon arrival, wakes to verbal stimuli, introduced self and physical therapy and pt states "we should try to walk". Pt able to come to sitting EOB with supv. Provided BLE strengthening exercises requiring counting out loud and constant VCs for motor control and attention to task. LE movement symmetrical during exercises, no trunk lean in sitting or LOB in any direction. Pt opens eyes to verbal stimuli for ~3 sec before return to closed. Appears confused, therapist has to reintroduce self and therapy multiple times during session. Ultimately cued pt to return to supine, pt states "why?" with eyes closed; pt appears unaware of current functional status and safety concerns with ambulation while lethargic. Session limited due to pt's lethargy, recommending 24 hr assist at this time, unsure if available at home. Will continue to progress acute PT as able.   Follow Up Recommendations  Supervision/Assistance - 24 hour (TBD, depending progress)     Equipment Recommendations  Rolling walker with 5" wheels    Recommendations for Other Services       Precautions / Restrictions Precautions Precautions: Fall Restrictions Weight Bearing Restrictions: No    Mobility  Bed Mobility Overal bed mobility: Needs Assistance Bed Mobility: Supine to Sit;Sit to Supine  Supine to sit: Supervision Sit to supine: Supervision   General bed mobility comments: pt comes to sitting with supv, appears tired, difficulty maintaining eyes opened for prolonged period of time, supv to return to supine    Transfers   General transfer comment: not attempted due to lethargy  Ambulation/Gait  General Gait Details: not attempted due to lethargy   Stairs             Wheelchair Mobility    Modified Rankin (Stroke Patients Only)       Balance Overall balance assessment: Needs assistance Sitting-balance support: Feet supported;No upper extremity supported Sitting balance-Leahy Scale: Fair Sitting balance - Comments: seated EOB, close supv       Cognition Arousal/Alertness: Lethargic Behavior During Therapy: Flat affect Overall Cognitive Status: Difficult to assess  General Comments: pt very lethargic, wakes to verbal stimuli and comes to sitting EOB, opens eyes to verbal cues for ~3 seconds then returns shut, mumbles words and asking sitter about medication, able to follow commands with increased time and VCs; returned to supine due to difficulty maintaining eyes opened and engaged with therapist      Exercises General Exercises - Lower Extremity Long Arc Quad: Seated;AROM;Strengthening;Both;5 reps Hip Flexion/Marching: Seated;AROM;Strengthening;Both;5 reps    General Comments        Pertinent Vitals/Pain Pain Assessment: No/denies pain    Home Living                      Prior Function            PT Goals (current goals can now be found in the care plan section) Acute Rehab PT Goals Patient Stated Goal: unable PT Goal Formulation: Patient unable to participate in goal setting Time For Goal Achievement: 08/15/20 Potential to Achieve Goals: Good Progress towards PT goals: Not progressing toward goals - comment (lethargy)    Frequency    Min  3X/week      PT Plan Current plan remains appropriate    Co-evaluation              AM-PAC PT "6 Clicks" Mobility   Outcome Measure  Help needed turning from your back to your side while in a flat bed without using bedrails?: A Little Help needed moving from lying on your back to sitting on the side of a flat  bed without using bedrails?: A Little Help needed moving to and from a bed to a chair (including a wheelchair)?: A Lot Help needed standing up from a chair using your arms (e.g., wheelchair or bedside chair)?: A Lot Help needed to walk in hospital room?: A Lot Help needed climbing 3-5 steps with a railing? : A Lot 6 Click Score: 14    End of Session   Activity Tolerance: Patient limited by lethargy Patient left: in bed;with call bell/phone within reach;with bed alarm set;with nursing/sitter in room Nurse Communication: Mobility status PT Visit Diagnosis: Difficulty in walking, not elsewhere classified (R26.2)     Time: 0017-4944 PT Time Calculation (min) (ACUTE ONLY): 12 min  Charges:  $Therapeutic Exercise: 8-22 mins                      Tori Jeremiah Curci PT, DPT 08/02/20, 3:36 PM

## 2020-08-02 NOTE — Progress Notes (Signed)
Pt received sitting on the edge of the bed. Bed alarm goes off every time he shift his position. Refuse to get in back in bed. Refuse to wear back hospital gown. Pt is awake, alert to self only. Not easily redirectable. No available physical sitter for tonight per report. Will continue to monitor pt.

## 2020-08-02 NOTE — Consult Note (Signed)
Cardiology Consultation:   Patient ID: Matthew Barnes MRN: [id number]; DOB: 1955/12/03  Admit date: 07/30/2020 Date of Consult: 08/02/2020  PCP:  System, Provider Not In   [facility] Providers Cardiologist:  Quay Burow, MD   {  Patient Profile:   Matthew Barnes is a 65 y.o. male with a hx of hypertension, hyperlipidemia, diabetes mellitus and bipolar disorder who is being seen 08/02/2020 for the evaluation of low EF at the request of Dr. Dwyane Dee.  Establish care with Dr. Gwenlyn Found December 2019 for evaluation of chest pain and shortness of breath.  Given cardiac risk factors and strong family history of CAD, recommended exercise Myoview and echocardiogram but it was never completed.  History of Present Illness:   Matthew Barnes initially presented on 5/2 with complaints of malaise and anxiety.  However noted word finding difficulty and unable to complete sentence.  Neurology is consulted and recommended complete stroke work-up.  He was given Haldol for agitation.  CT angio of chest negative for pulmonary embolism.  CT of head without acute finding.  EEG without seizure.  Pending MRI of brain under general anesthesia.  Carotid Doppler with minimal plaque bilaterally.  Echocardiogram yesterday showed LV function of 20 to 25%, global LV hypokinesis, normal RV function, pericardial effusion is posterior to the left ventricle.  Patient was seen by psychiatric yesterday and recommended to initiate delirium precaution and reconsult once neurological work-up completed.   Scr 1.08>>1.38 K 3.7 Hgb 10.8 BNP 372 Hs-Troponin 32>>33>>34  Patient is hard to arouse this morning.  Sitter at bedside.  No family member present.  Unable to get history from patient.  Past Medical History:  Diagnosis Date  . Bipolar II disorder - Managed by Marye Round [phone]) 05/03/2013  . Diabetes mellitus   . Dysphagia   . Hyperlipidemia   . Hypertension   . Unspecified hypothyroidism 05/03/2013    Past Surgical  History:  Procedure Laterality Date  . KNEE SURGERY     scope on left knee     Inpatient Medications: Scheduled Meds: .  stroke: mapping our early stages of recovery book   Does not apply Once  . aspirin  300 mg Rectal Daily  . azithromycin  500 mg Oral Daily  . gatifloxacin  1 drop Both Eyes QID  . heparin  5,000 Units Subcutaneous Q8H  . insulin aspart  0-15 Units Subcutaneous TID WC  . insulin aspart  0-5 Units Subcutaneous QHS  . insulin detemir  10 Units Subcutaneous BID  . lithium carbonate  900 mg Oral Daily  . pantoprazole (PROTONIX) IV  40 mg Intravenous Q12H  . QUEtiapine  400 mg Oral QHS  . sodium chloride flush  3 mL Intravenous Q12H  . thiamine injection  100 mg Intravenous Daily   Continuous Infusions: . sodium chloride     PRN Meds: sodium chloride, acetaminophen **OR** acetaminophen, metoprolol tartrate, ondansetron **OR** ondansetron (ZOFRAN) IV, QUEtiapine, sodium chloride flush  Allergies:    Allergies  Allergen Reactions  . Influenza Vaccines Swelling    Reports of swelling of the arm and then sick for 10 days    Social History:   Social History   Socioeconomic History  . Marital status: Single    Spouse name: Not on file  . Number of children: Not on file  . Years of education: Not on file  . Highest education level: Not on file  Occupational History  . Not on file  Tobacco Use  . Smoking status: Never Smoker  .  Smokeless tobacco: Never Used  Substance and Sexual Activity  . Alcohol use: No  . Drug use: No  . Sexual activity: Never  Other Topics Concern  . Not on file  Social History Narrative   Work or School: on disability for knee arthritis      Home Situation: lives alone      Spiritual Beliefs:Christian      Lifestyle:no regular exercising; diet not great            Social Determinants of Health   Financial Resource Strain: Low Risk   . Difficulty of Paying Living Expenses: Not hard at all  Food Insecurity: No Food  Insecurity  . Worried About Charity fundraiser in the Last Year: Never true  . Ran Out of Food in the Last Year: Never true  Transportation Needs: No Transportation Needs  . Lack of Transportation (Medical): No  . Lack of Transportation (Non-Medical): No  Physical Activity: Insufficiently Active  . Days of Exercise per Week: 5 days  . Minutes of Exercise per Session: 20 min  Stress: No Stress Concern Present  . Feeling of Stress : Not at all  Social Connections: Socially Isolated  . Frequency of Communication with Friends and Family: Three times a week  . Frequency of Social Gatherings with Friends and Family: Never  . Attends Religious Services: Never  . Active Member of Clubs or Organizations: No  . Attends Archivist Meetings: Never  . Marital Status: Never married  Intimate Partner Violence: Not At Risk  . Fear of Current or Ex-Partner: No  . Emotionally Abused: No  . Physically Abused: No  . Sexually Abused: No    Family History:   Family History  Problem Relation Age of Onset  . Diabetes Mother   . Hypertension Mother   . Heart disease Mother   . Heart disease Father   . Heart attack Father 52  . Diabetes Father   . Hypertension Father   . Depression Father   . Diabetes Sister   . Depression Paternal Grandmother   . Depression Other      ROS:  Please see the history of present illness.  All other ROS reviewed and negative.     Physical Exam/Data:   Vitals:   07/31/20 2149 08/01/20 0123 08/01/20 1321 08/01/20 2025  BP: (!) 150/91 127/89 135/77 (!) 148/110  Pulse: 98 96 93 (!) 102  Resp: (!) 24 (!) 21 17 19   Temp: 97.6 F (36.4 C) 98 F (36.7 C) 98.6 F (37 C) 98.2 F (36.8 C)  TempSrc: Oral Oral  Oral  SpO2: 94% 92% 90% 94%  Weight:      Height:        Intake/Output Summary (Last 24 hours) at 08/02/2020 0915 Last data filed at 08/01/2020 1800 Gross per 24 hour  Intake 360 ml  Output 400 ml  Net -40 ml   Last 3 Weights 07/31/2020 07/30/2020  06/04/2020  Weight (lbs) 277 lb 5.4 oz 291 lb 7.2 oz 291 lb 6.4 oz  Weight (kg) 125.8 kg 132.2 kg 132.178 kg     Body mass index is 36.59 kg/m.  General:  Well nourished, well developed, in no acute distress HEENT: normal Lymph: no adenopathy Neck: JVD difficult to assess  Endocrine:  No thryomegaly Vascular: No carotid bruits; FA pulses 2+ bilaterally without bruits  Cardiac:  normal S1, S2; RRR; no murmur  Lungs:  clear to auscultation anteriorly Abd: soft, nontender, no hepatomegaly  Ext:  no edema Musculoskeletal:  No deformities, BUE and BLE strength normal and equal Skin: warm and dry  Neuro:   no focal abnormalities noted Psych: Very lethargic  EKG:  The EKG was personally reviewed and demonstrates: Sinus tachycardia with nonspecific ST/T wave changes Telemetry:  Telemetry was personally reviewed and demonstrates: Not on telemetry.  Relevant CV Studies:  Echo 08/01/2020 1. No subcostal images.  2. Left ventricular ejection fraction, by estimation, is 20 to 25%. The  left ventricle has severely decreased function. The left ventricle  demonstrates global hypokinesis. The left ventricular internal cavity size  was severely dilated. Left ventricular  diastolic parameters are indeterminate.  3. Right ventricular systolic function is normal. The right ventricular  size is normal. There is mildly elevated pulmonary artery systolic  pressure.  4. The pericardial effusion is posterior to the left ventricle.  5. The mitral valve is normal in structure. Mild mitral valve  regurgitation. No evidence of mitral stenosis.  6. The aortic valve is tricuspid. Aortic valve regurgitation is not  visualized. Mild aortic valve sclerosis is present, with no evidence of  aortic valve stenosis.  7. The inferior vena cava is normal in size with greater than 50%  respiratory variability, suggesting right atrial pressure of 3 mmHg.   Laboratory Data:  High Sensitivity Troponin:   Recent  Labs  Lab 07/31/20 0127 07/31/20 0409 07/31/20 0740  TROPONINIHS 32* 33* 34*     Chemistry Recent Labs  Lab 07/31/20 0643 08/01/20 0344 08/02/20 0358  NA 143 143 143  K 3.9 3.8 3.7  CL 107 108 109  CO2 24 24 24   GLUCOSE 241* 114* 136*  BUN 16 14 18   CREATININE 1.18 1.08 1.38*  CALCIUM 9.6 9.1 8.8*  GFRNONAA >60 >60 57*  ANIONGAP 12 11 10     Recent Labs  Lab 07/30/20 2101 07/31/20 0643 08/01/20 0344  PROT 7.3 7.2 6.5  ALBUMIN 4.0 4.1 3.6  AST 33 35 38  ALT 30 33 32  ALKPHOS 49 50 40  BILITOT 1.2 1.0 1.0   Hematology Recent Labs  Lab 07/31/20 0643 08/01/20 0344 08/02/20 0358  WBC 8.8 7.3 5.5  RBC 4.92 4.56 4.36  HGB 12.5* 11.7* 10.8*  HCT 40.7 38.3* 36.4*  MCV 82.7 84.0 83.5  MCH 25.4* 25.7* 24.8*  MCHC 30.7 30.5 29.7*  RDW 14.3 14.4 14.2  PLT 224 186 173   BNP Recent Labs  Lab 07/31/20 0007  BNP 372.3*    DDimer  Recent Labs  Lab 07/31/20 0836  DDIMER 1.19*     Radiology/Studies:  DG Chest 2 View  Result Date: 07/30/2020 CLINICAL DATA:  Cough EXAM: CHEST - 2 VIEW COMPARISON:  05/21/2020 FINDINGS: The heart size and mediastinal contours are within normal limits. Both lungs are clear. The visualized skeletal structures are unremarkable. IMPRESSION: No active cardiopulmonary disease. Electronically Signed   By: Ulyses Jarred M.D.   On: 07/30/2020 21:06   CT Head Wo Contrast  Result Date: 07/30/2020 CLINICAL DATA:  Neurologic deficit, worsening depression EXAM: CT HEAD WITHOUT CONTRAST TECHNIQUE: Contiguous axial images were obtained from the base of the skull through the vertex without intravenous contrast. COMPARISON:  07/13/2019, 05/13/2010 FINDINGS: Brain: No acute infarct or hemorrhage. Lateral ventricles and midline structures are unremarkable. No acute extra-axial fluid collections. No mass effect. Vascular: No hyperdense vessel or unexpected calcification. Skull: Normal. Negative for fracture or focal lesion. Sinuses/Orbits: Mild  mucoperiosteal thickening of the right maxillary and left sphenoid sinuses. Other: None. IMPRESSION: 1. No  acute intracranial process. Electronically Signed   By: Randa Ngo M.D.   On: 07/30/2020 23:26   CT ANGIO CHEST PE W OR WO CONTRAST  Result Date: 07/31/2020 CLINICAL DATA:  Cough. EXAM: CT ANGIOGRAPHY CHEST WITH CONTRAST TECHNIQUE: Multidetector CT imaging of the chest was performed using the standard protocol during bolus administration of intravenous contrast. Multiplanar CT image reconstructions and MIPs were obtained to evaluate the vascular anatomy. CONTRAST:  134mL OMNIPAQUE IOHEXOL 350 MG/ML SOLN COMPARISON:  None. FINDINGS: The study is markedly limited secondary to patient motion and the inability of the patient to raise his arms up during the exam. As result, motion artifact and beam hardening artifact are seen. Cardiovascular: There is very mild calcification of the aortic arch without evidence of aortic aneurysm. The subsegmental pulmonary arteries are limited in evaluation secondary to suboptimal opacification with intravenous contrast. Motion artifact is also seen involving the segmental and subsegmental pulmonary arteries. Ill-defined areas of intraluminal low attenuation are seen involving multiple upper lobe and lower lobe branches of the bilateral pulmonary arteries. There is mild to moderate severity cardiomegaly. No pericardial effusion. Mediastinum/Nodes: Multiple subcentimeter pretracheal and AP window lymph nodes are seen. Thyroid gland, trachea, and esophagus demonstrate no significant findings. Lungs/Pleura: Mild atelectasis and/or infiltrate is seen within the posterior aspect of the left lung base. There is no evidence of a pleural effusion or pneumothorax. Upper Abdomen: No acute abnormality. Musculoskeletal: No chest wall abnormality. No acute or significant osseous findings. Review of the MIP images confirms the above findings. IMPRESSION: 1. Markedly limited study with  areas of intraluminal low attenuation involving bilateral upper lobe and lower lobe branches of the pulmonary arteries. While this may be secondary to extensive areas of artifact, bilateral pulmonary embolism cannot completely be excluded. Additional evaluation with a nuclear medicine ventilation/perfusion scan or follow-up chest CTA is recommended. 2. Mild left basilar atelectasis and/or infiltrate. Electronically Signed   By: Virgina Norfolk M.D.   On: 07/31/2020 03:02   EEG adult  Result Date: 08/01/2020 [name], MD     08/01/2020  6:06 PM Patient Name: JAVIS ABBOUD MRN: [id number] Epilepsy Attending: [name] Referring Physician/Provider: Dr Kerney Elbe Date: 08/01/2020 Duration: 25.13 mins Patient history: 65yo M with speech disturbance and ams. EEG to evaluate for seizure. Level of alertness: Awake AEDs during EEG study: None Technical aspects: This EEG study was done with scalp electrodes positioned according to the 10-20 International system of electrode placement. Electrical activity was acquired at a sampling rate of 500Hz  and reviewed with a high frequency filter of 70Hz  and a low frequency filter of 1Hz . EEG data were recorded continuously and digitally stored. Description: The posterior dominant rhythm consists of 7 Hz activity of moderate voltage (25-35 uV) seen predominantly in posterior head regions, symmetric and reactive to eye opening and eye closing. EEG showed continuous generalized 5 to 6 Hz theta slowing.  Hyperventilation and photic stimulation were not performed.   ABNORMALITY - Continuous slow, generalized - Background slow IMPRESSION: This study is suggestive of moderate diffuse encephalopathy, nonspecific etiology. No seizures or epileptiform discharges were seen throughout the recording. Priyanka O Yadav   VAS US CAROTID (at [facility] and [facility] only)  Result Date: 07/31/2020 Carotid Arterial Duplex Study Patient Name:  SHONTA PHILLIS  Date of Exam:   07/31/2020 Medical Rec #:  [id number]     Accession #:    [id number] Date of Birth: 05/03/1955    Patient Gender: M Patient Age:   22Y  Exam Location:  [facility] Procedure:      VAS US CAROTID Referring Phys: [place] --------------------------------------------------------------------------------  Indications:       Aphasia. Risk Factors:      Hypertension, hyperlipidemia, Diabetes. Limitations        Today's exam was limited due to the body habitus of the                    patient, the patient's respiratory variation and patient                    agitation, constant patient movement. Comparison Study:  No prior studies. Performing Technologist: Oliver Hum RVT  Examination Guidelines: A complete evaluation includes B-mode imaging, spectral Doppler, color Doppler, and power Doppler as needed of all accessible portions of each vessel. Bilateral testing is considered an integral part of a complete examination. Limited examinations for reoccurring indications may be performed as noted.  Right Carotid Findings: +----------+--------+--------+--------+-----------------------+--------+           PSV cm/sEDV cm/sStenosisPlaque Description     Comments +----------+--------+--------+--------+-----------------------+--------+ CCA Prox  84      16              smooth and heterogenoustortuous +----------+--------+--------+--------+-----------------------+--------+ CCA Distal43      8               smooth and heterogenous         +----------+--------+--------+--------+-----------------------+--------+ ICA Prox  43      13              smooth and heterogenous         +----------+--------+--------+--------+-----------------------+--------+ ICA Distal67      23                                     tortuous +----------+--------+--------+--------+-----------------------+--------+ ECA       52      8                                                +----------+--------+--------+--------+-----------------------+--------+ +----------+--------+-------+--------+-------------------+           PSV cm/sEDV cmsDescribeArm Pressure (mmHG) +----------+--------+-------+--------+-------------------+ [id number]                                        +----------+--------+-------+--------+-------------------+ +---------+--------+--+--------+-+---------+ VertebralPSV cm/s30EDV cm/s9Antegrade +---------+--------+--+--------+-+---------+  Left Carotid Findings: +----------+--------+--------+--------+-----------------------+--------+           PSV cm/sEDV cm/sStenosisPlaque Description     Comments +----------+--------+--------+--------+-----------------------+--------+ CCA Prox  70      9               smooth and heterogenous         +----------+--------+--------+--------+-----------------------+--------+ CCA Distal55      12              smooth and heterogenous         +----------+--------+--------+--------+-----------------------+--------+ ICA Prox  54      15              smooth and heterogenoustortuous +----------+--------+--------+--------+-----------------------+--------+ ICA Distal54      14  tortuous +----------+--------+--------+--------+-----------------------+--------+ ECA       66      9                                               +----------+--------+--------+--------+-----------------------+--------+ Unable to obtain vertebral and subclavian artery images due to patient agitation.  Summary: Right Carotid: Velocities in the right ICA are consistent with a 1-39% stenosis. Left Carotid: Velocities in the left ICA are consistent with a 1-39% stenosis. Vertebrals: Right vertebral artery demonstrates antegrade flow. *See table(s) above for measurements and observations.  Electronically signed by Curt Jews MD on 07/31/2020 at 8:20:50 PM.    Final    ECHOCARDIOGRAM  LIMITED  Result Date: 08/01/2020    ECHOCARDIOGRAM REPORT   Patient Name:   PETRO MALETTE Date of Exam: 08/01/2020 Medical Rec #:  [id number]    Height:       73.0 in Accession #:    [id number]   Weight:       277.3 lb Date of Birth:  Apr 24, 1955   BSA:          2.472 m Patient Age:    53 years     BP:           127/89 mmHg Patient Gender: M            HR:           60 bpm. Exam Location:  Inpatient Procedure: 2D Echo, Cardiac Doppler and Color Doppler Indications:    CVA  History:        Patient has no prior history of Echocardiogram examinations.                 Risk Factors:Hypertension and Dyslipidemia.  Sonographer:    Luisa Hart RDCS Referring Phys: [id number] EKTA V PATEL  Sonographer Comments: Technically difficult study due to poor echo windows and patient is morbidly obese. Image acquisition challenging due to uncooperative patient and Image acquisition challenging due to patient behavioral factors. IMPRESSIONS  1. No subcostal images.  2. Left ventricular ejection fraction, by estimation, is 20 to 25%. The left ventricle has severely decreased function. The left ventricle demonstrates global hypokinesis. The left ventricular internal cavity size was severely dilated. Left ventricular diastolic parameters are indeterminate.  3. Right ventricular systolic function is normal. The right ventricular size is normal. There is mildly elevated pulmonary artery systolic pressure.  4. The pericardial effusion is posterior to the left ventricle.  5. The mitral valve is normal in structure. Mild mitral valve regurgitation. No evidence of mitral stenosis.  6. The aortic valve is tricuspid. Aortic valve regurgitation is not visualized. Mild aortic valve sclerosis is present, with no evidence of aortic valve stenosis.  7. The inferior vena cava is normal in size with greater than 50% respiratory variability, suggesting right atrial pressure of 3 mmHg. FINDINGS  Left Ventricle: Left ventricular ejection fraction, by  estimation, is 20 to 25%. The left ventricle has severely decreased function. The left ventricle demonstrates global hypokinesis. The left ventricular internal cavity size was severely dilated. There is no left ventricular hypertrophy. Left ventricular diastolic parameters are indeterminate. Right Ventricle: The right ventricular size is normal. No increase in right ventricular wall thickness. Right ventricular systolic function is normal. There is mildly elevated pulmonary artery systolic pressure. The tricuspid regurgitant velocity is 3.03  m/s, and with an  assumed right atrial pressure of 3 mmHg, the estimated right ventricular systolic pressure is 93.7 mmHg. Left Atrium: Left atrial size was normal in size. Right Atrium: Right atrial size was normal in size. Pericardium: Trivial pericardial effusion is present. The pericardial effusion is posterior to the left ventricle. Mitral Valve: The mitral valve is normal in structure. Mild mitral valve regurgitation. No evidence of mitral valve stenosis. Tricuspid Valve: The tricuspid valve is normal in structure. Tricuspid valve regurgitation is mild . No evidence of tricuspid stenosis. Aortic Valve: The aortic valve is tricuspid. Aortic valve regurgitation is not visualized. Mild aortic valve sclerosis is present, with no evidence of aortic valve stenosis. Pulmonic Valve: The pulmonic valve was normal in structure. Pulmonic valve regurgitation is not visualized. No evidence of pulmonic stenosis. Aorta: The aortic root is normal in size and structure. Venous: The inferior vena cava is normal in size with greater than 50% respiratory variability, suggesting right atrial pressure of 3 mmHg. IAS/Shunts: The interatrial septum was not well visualized. Additional Comments: No subcostal images.  LEFT VENTRICLE PLAX 2D LVIDd:         7.20 cm LVIDs:         6.10 cm LV PW:         1.40 cm LV IVS:        0.80 cm LVOT diam:     2.30 cm LVOT Area:     4.15 cm  RIGHT VENTRICLE RV S  prime:     13.40 cm/s LEFT ATRIUM            Index       RIGHT ATRIUM           Index LA Vol (A4C): 118.0 ml 47.73 ml/m RA Area:     26.60 cm                                    RA Volume:   101.00 ml 40.85 ml/m                        PULMONIC VALVE AORTA                 PV Vmax:       0.86 m/s Ao Root diam: 3.70 cm PV Vmean:      56.900 cm/s                       PV VTI:        0.126 m                       PV Peak grad:  3.0 mmHg                       PV Mean grad:  1.0 mmHg  TRICUSPID VALVE TR Peak grad:   36.7 mmHg TR Vmax:        303.00 cm/s  SHUNTS Systemic Diam: 2.30 cm Jenkins Rouge MD Electronically signed by Jenkins Rouge MD Signature Date/Time: 08/01/2020/4:10:01 PM    Final      Assessment and Plan:   1. Chronic systolic heart failure -Echocardiogram  showed LV function of 20 to 25%, global LV hypokinesis, normal RV function, pericardial effusion is posterior to the left ventricle. -Exam limited due to body habitus possible delirious.  Hard to arouse.  However,  laying flat on bed. -Peviously seen by Dr. Gwenlyn Found and recommended echo and stress test given cardiac risk factor>> but never completed for some reason.  Now echocardiogram with low LV function.  He will need ischemic evaluation however given bipolar disorder and agitation currently not a candidate. -Would consider guideline directed medical therapy, hopefully he will be compliant.  Daily dose would be beneficial.  2.  Hypertension -Blood pressure relatively stable -Home amlodipine, hydrochlorothiazide and losartan on hold -Will likely placed on beta-blocker and ACE/ARB given low EF  3. AKI -Scr 1.38 today - Follow closely   Otherwise per primary team   For questions or updates, please contact [place] Please consult [email or web address] for contact info under    Jarrett Soho, PA  08/02/2020 9:15 AM

## 2020-08-02 NOTE — Progress Notes (Signed)
SLP Cancellation Note  Patient Details Name: Matthew Barnes MRN: [id number] DOB: 11/16/1955   Cancelled treatment:       Reason Eval/Treat Not Completed: Unable to complete cognitive-communication evaluation at this time, as pt is currently sleeping. RN requested not to disturb him. MRI ordered, but has not been completed yet. Will continue efforts.   [name] B. Quentin Ore, [facility], [place] Speech Language Pathologist Office: [phone] Pager: [phone]  Shonna Chock 08/02/2020, 12:44 PM

## 2020-08-02 NOTE — Progress Notes (Signed)
PHARMACIST - PHYSICIAN COMMUNICATION  DR:   Dwyane Dee  CONCERNING: IV to Oral Route Change Policy  RECOMMENDATION: This patient is receiving Protonix and Thiamine by the intravenous route.  Based on criteria approved by the Pharmacy and Therapeutics Committee, the intravenous medication(s) is/are being converted to the equivalent oral dose form(s).   DESCRIPTION: These criteria include:  The patient is eating (either orally or via tube) and/or has been taking other orally administered medications for a least 24 hours  The patient has no evidence of active gastrointestinal bleeding or impaired GI absorption (gastrectomy, short bowel, patient on TNA or NPO).  If you have questions about this conversion, please contact the [place], PharmD, [place]: [phone] 08/02/2020 2:05 PM

## 2020-08-02 NOTE — Progress Notes (Signed)
Spoke with sister, provided with generic updates. Sister requested that MD please call family or preferably brother charley with updates. Number in the chart. Made MD Dwyane Dee aware.

## 2020-08-02 NOTE — Progress Notes (Signed)
Shift Summary:   Remained alert to self,and hospital. Some periods of agitation during this shift, but mostly cooperative. Patient took medications in the ladder part of the shift. Still refusing telemetry. Called [facility], to see if MRI could be done here instead of [facility] per MR order. and per MRI they were full at this time, and wouldn't be able to take patient until after 9 pm order early tomorrow morning. New 20G piv placed to right forearm, saline locked. Observer remains at bedside. No other needs identified. Will continue to monitor.

## 2020-08-02 NOTE — Progress Notes (Signed)
PROGRESS NOTE    Matthew Barnes  [id number] DOB: [birth year] DOA: 07/30/2020 PCP: System, Provider Not In   Brief Narrative: This 65 years old male with PMH significant for bipolar disorder II , diabetes mellitus, hyperlipidemia, hypertension, hypothyroidism presented in the ED with difficulty finding words and completing sentences.  History is limited due to patient's inability to complete sentence.  Patient has significant history of depression and bipolar and being managed by psychiatrist Dr. Quillian Quince.  Patient was slightly aphasic but there is no focal neurological deficits noted on exam. In the ED ethanol level is less than 10,  CT head without no acute intracranial abnormality,  urine drug screen is negative.  MRI, echo and carotid duplex is ordered.  CTA chest : No evidence of PE, seems not conclusive but showed mild infiltrate. Patient has been agitated and restless,  not cooperative to have an MRI. Neurology is consulted, recommended complete stroke work-up.  MRI was attempted with sedation twice but patient was restless and agitated.  Neurology recommended transfer to [facility] for MRI under anesthesia and EEG if positive may require long-term EEG.  Patient is having delirium.  Psychiatry is consulted. Echocardiogram shows EF 20 to 25%.  No prior echocardiogram available.  Cardiology consulted recommended ischemic evaluation however given bipolar and agitation currently not a candidate.   Assessment & Plan:   Principal Problem:   Aphasia Active Problems:   Hypertension associated with diabetes ([place])   Hyperlipidemia   Type 2 diabetes mellitus with diabetic neuropathy, with long-term current use of insulin (HCC)   Esophageal reflux   Bipolar affective, mixed ([place])   AMS (altered mental status)   Aphasia/ Word finding difficulty:  Patient presented with difficulty with finding words and completing sentences. Exact onset of symptoms unknown.  Patient does have history of bipolar  disorder II.  Differential diagnoses include speech disorder due to underlying psychiatric disorder or CVA. CT head negative for any acute abnormalities. Continue aspirin and statin. Obtain MRI, echocardiogram, carotid duplex. Neurology consulted, recommended complete stroke workup. Rule out reversible causes of memory impairment, TSH, B12, Folate, RPR MRI was attempted twice with sedation and Haldol. Patient has been restless and agitated, MRI is pending. Neurology recommended transfer to [facility] for MRI under anesthesia. Patient may require long-term EEG which is possible it [employer]. Neurochecks every 4 to 6 hours. Carotid duplex: No significant stenosis noted. Echocardiogram shows EF 20 to 25%, no prior echo available.  Cardiology is consulted. EEG no evidence of seizures.  Hypertension:  Home blood pressure medications are on hold to allow permissive hypertension. Resume Home BP when stable. BP relatively stable.  Hyperlipidemia  : Continue Lipitor.  Diabetes mellitus type 2 :  Continue Levemir 10 units bid and sliding scale.  Sinus tachycardia:  CTA chest was completed, shows artifacts. Not conclusive. Started metoprolol 5 mg Iv q8hr prn. CT chest shows mild infiltrate. Patient was started on ceftriaxone and Zithromax. Procalcitonin normal.  Consider discontinuing antibiotic if no leukocytosis.   Bipolar disorder : Continue Seroquel. Psychiatry consulted, stated patient is having delirium. Advised to dc Haldol, started Seroquel 25 mg daily. Continue one-to-one observation. Resume Lithium   Elevated Trop : Trop remained flat could be due to demand ischemia. Patient denies chest pain. Trop 32 > 33> 34. Echocardiogram shows LVEF 20 to 25%. Cardiology consulted recommended ischemic evaluation however given bipolar and agitation not a candidate. Recommended goal-directed therapy.   DVT prophylaxis: Heparin Code Status: Full code. Family Communication:No family  at  bed side. Disposition Plan:   Status is: Inpatient  Remains inpatient appropriate because:Inpatient level of care appropriate due to severity of illness   Dispo: The patient is from: Home              Anticipated d/c is to: Home              Patient currently is not medically stable to d/c.   Difficult to place patient No   Consultants:     Neurology , Psychiatry  Procedures: CT head, echo, MRI. Antimicrobials: Anti-infectives (From admission, onward)   Start     Dose/Rate Route Frequency Ordered Stop   08/02/20 0630  azithromycin (ZITHROMAX) tablet 500 mg        500 mg Oral Daily 08/02/20 0539     07/31/20 0400  cefTRIAXone (ROCEPHIN) 1 g in sodium chloride 0.9 % 100 mL IVPB        1 g 200 mL/hr over 30 Minutes Intravenous  Once 07/31/20 0355 07/31/20 0545   07/31/20 0400  azithromycin (ZITHROMAX) 500 mg in sodium chloride 0.9 % 250 mL IVPB  Status:  Discontinued        500 mg 250 mL/hr over 60 Minutes Intravenous Every 24 hours 07/31/20 0355 08/02/20 0539      Subjective: Patient was seen and examined at bedside.  Overnight events noted. Patient remains confused, agitated.  He is alert and arousable.  He goes to sleep quickly. RN reported he did not sleep last night. He is lying comfortably on the bed, stated yes to all the questions asked.  MRI is pending.  Objective: Vitals:   07/31/20 2149 08/01/20 0123 08/01/20 1321 08/01/20 2025  BP: (!) 150/91 127/89 135/77 (!) 148/110  Pulse: 98 96 93 (!) 102  Resp: (!) 24 (!) 21 17 19   Temp: 97.6 F (36.4 C) 98 F (36.7 C) 98.6 F (37 C) 98.2 F (36.8 C)  TempSrc: Oral Oral  Oral  SpO2: 94% 92% 90% 94%  Weight:      Height:        Intake/Output Summary (Last 24 hours) at 08/02/2020 1157 Last data filed at 08/02/2020 1038 Gross per 24 hour  Intake 600 ml  Output 0 ml  Net 600 ml   Filed Weights   07/30/20 2029 07/31/20 1522  Weight: 132.2 kg 125.8 kg    Examination:  General exam: Appears calm, comfortable  , slight fidgety .  He is not cooperative, arousable. Respiratory system: Clear to auscultation. Respiratory effort normal. Cardiovascular system: S1 & S2 heard, RRR. No JVD, murmurs, rubs, gallops or clicks. No pedal edema. Gastrointestinal system: Abdomen is nondistended, soft and nontender. No organomegaly or masses felt.   Normal bowel sounds heard. Central nervous system: Alert and oriented. No focal neurological deficits. Extremities: Moves All 4 extremities.  No focal neurological deficits. Skin: No rashes, lesions or ulcers Psychiatry: Not completed    Data Reviewed: I have personally reviewed following labs and imaging studies  CBC: Recent Labs  Lab 07/30/20 2101 07/31/20 0643 08/01/20 0344 08/02/20 0358  WBC 8.4 8.8 7.3 5.5  NEUTROABS 5.5  --   --   --   HGB 12.2* 12.5* 11.7* 10.8*  HCT 40.3 40.7 38.3* 36.4*  MCV 83.1 82.7 84.0 83.5  PLT 203 224 186 [id number]   Basic Metabolic Panel: Recent Labs  Lab 07/30/20 2101 07/30/20 2120 07/31/20 0643 08/01/20 0344 08/02/20 0358  NA 138  --  143 143 143  K 3.8  --  3.9 3.8 3.7  CL 106  --  107 108 109  CO2 23  --  24 24 24   GLUCOSE 207*  --  241* 114* 136*  BUN 14  --  16 14 18   CREATININE 1.24  --  1.18 1.08 1.38*  CALCIUM 9.1  --  9.6 9.1 8.8*  MG  --  1.9 2.1 2.0 2.0  PHOS  --  3.5 3.5 4.0 4.7*   GFR: Estimated Creatinine Clearance: 75.2 mL/min (A) (by C-G formula based on SCr of 1.38 mg/dL (H)). Liver Function Tests: Recent Labs  Lab 07/30/20 2101 07/31/20 0643 08/01/20 0344  AST 33 35 38  ALT 30 33 32  ALKPHOS 49 50 40  BILITOT 1.2 1.0 1.0  PROT 7.3 7.2 6.5  ALBUMIN 4.0 4.1 3.6   No results for input(s): LIPASE, AMYLASE in the last 168 hours. Recent Labs  Lab 07/31/20 0740  AMMONIA 29   Coagulation Profile: No results for input(s): INR, PROTIME in the last 168 hours. Cardiac Enzymes: Recent Labs  Lab 07/31/20 0127 07/31/20 0643  CKTOTAL 404* 417*   BNP (last 3 results) No results for  input(s): PROBNP in the last 8760 hours. HbA1C: Recent Labs    07/31/20 0409  HGBA1C 8.3*   CBG: Recent Labs  Lab 08/01/20 0803 08/01/20 1224 08/01/20 1654 08/01/20 2033 08/02/20 0821  GLUCAP 151* 161* 135* 145* 158*   Lipid Profile: Recent Labs    07/31/20 0409  CHOL 116  HDL 24*  LDLCALC 79  TRIG 65  CHOLHDL 4.8   Thyroid Function Tests: No results for input(s): TSH, T4TOTAL, FREET4, T3FREE, THYROIDAB in the last 72 hours. Anemia Panel: Recent Labs    07/31/20 0740  VITAMINB12 397   Sepsis Labs: Recent Labs  Lab 07/31/20 0127 07/31/20 0409 07/31/20 1702  PROCALCITON  --   --  <0.10  LATICACIDVEN 1.4 1.4  --     Recent Results (from the past 240 hour(s))  Culture, Urine     Status: None   Collection Time: 07/30/20  9:00 PM   Specimen: Urine, Random  Result Value Ref Range Status   Specimen Description   Final    URINE, RANDOM Performed at [facility], [place] [address] [place], [place] [address]    Special Requests   Final    NONE Performed at [facility], [place] [address] [place], [place] [address]    Culture   Final    NO GROWTH Performed at [facility], [place] [address], [place], [place] [address]    Report Status 08/01/2020 FINAL  Final  Resp Panel by RT-PCR (Flu A&B, Covid) Nasopharyngeal Swab     Status: None   Collection Time: 07/30/20 11:15 PM   Specimen: Nasopharyngeal Swab; Nasopharyngeal(NP) swabs in vial transport medium  Result Value Ref Range Status   SARS Coronavirus 2 by RT PCR NEGATIVE NEGATIVE Final    Comment: (NOTE) SARS-CoV-2 target nucleic acids are NOT DETECTED.  The SARS-CoV-2 RNA is generally detectable in upper respiratory specimens during the acute phase of infection. The lowest concentration of SARS-CoV-2 viral copies this assay can detect is 138 copies/mL. A negative result does not preclude SARS-Cov-2 infection and should not be used as the sole basis for treatment  or other patient management decisions. A negative result may occur with  improper specimen collection/handling, submission of specimen other than nasopharyngeal swab, presence of viral mutation(s) within the areas targeted by this assay, and inadequate number of  viral copies(<138 copies/mL). A negative result must be combined with clinical observations, patient history, and epidemiological information. The expected result is Negative.  Fact Sheet for Patients:  EntrepreneurPulse.com.au  Fact Sheet for Healthcare Providers:  IncredibleEmployment.be  This test is no t yet approved or cleared by the [place] FDA and  has been authorized for detection and/or diagnosis of SARS-CoV-2 by FDA under an Emergency Use Authorization (EUA). This EUA will remain  in effect (meaning this test can be used) for the duration of the COVID-19 declaration under Section 564(b)(1) of the Act, 21 U.S.C.section 360bbb-3(b)(1), unless the authorization is terminated  or revoked sooner.       Influenza A by PCR NEGATIVE NEGATIVE Final   Influenza B by PCR NEGATIVE NEGATIVE Final    Comment: (NOTE) The Xpert Xpress SARS-CoV-2/FLU/RSV plus assay is intended as an aid in the diagnosis of influenza from Nasopharyngeal swab specimens and should not be used as a sole basis for treatment. Nasal washings and aspirates are unacceptable for Xpert Xpress SARS-CoV-2/FLU/RSV testing.  Fact Sheet for Patients: EntrepreneurPulse.com.au  Fact Sheet for Healthcare Providers: IncredibleEmployment.be  This test is not yet approved or cleared by the [place] FDA and has been authorized for detection and/or diagnosis of SARS-CoV-2 by FDA under an Emergency Use Authorization (EUA). This EUA will remain in effect (meaning this test can be used) for the duration of the COVID-19 declaration under Section 564(b)(1) of the Act, 21 U.S.C. section  360bbb-3(b)(1), unless the authorization is terminated or revoked.  Performed at [facility], [place] [address] [place], [place] [address]   Culture, blood (Routine X 2) w Reflex to ID Panel     Status: None (Preliminary result)   Collection Time: 07/31/20  1:27 AM   Specimen: BLOOD  Result Value Ref Range Status   Specimen Description   Final    BLOOD LEFT ANTECUBITAL Performed at [place] [address] [place], [place] [address]    Special Requests   Final    BOTTLES DRAWN AEROBIC AND ANAEROBIC Blood Culture adequate volume Performed at [place] [address] [place], [place] [address]    Culture   Final    NO GROWTH 2 DAYS Performed at [place] [address] [place], [place] [address]    Report Status PENDING  Incomplete  Culture, blood (Routine X 2) w Reflex to ID Panel     Status: None (Preliminary result)   Collection Time: 07/31/20  4:09 AM   Specimen: BLOOD  Result Value Ref Range Status   Specimen Description   Final    BLOOD RIGHT ANTECUBITAL Performed at [facility] [place] [address], [place], [place] [address]    Special Requests   Final    BOTTLES DRAWN AEROBIC AND ANAEROBIC Blood Culture results may not be optimal due to an inadequate volume of blood received in culture bottles Performed at [facility] [address], [place], [place] [address]    Culture   Final    NO GROWTH 2 DAYS Performed at [facility] [address] [place], [place] [address]    Report Status PENDING  Incomplete    Radiology Studies: EEG adult  Result Date: 2020-08-26 [name], MD     08-26-2020  6:06 PM Patient Name: KARON HECKENDORN MRN: [id number] Epilepsy Attending: [name] Referring Physician/Provider: Dr Kerney Elbe Date: Aug 26, 2020 Duration: 25.13 mins Patient history: 64yo Jerilynn Mages  with speech disturbance and ams. EEG to evaluate for seizure. Level  of alertness: Awake AEDs during EEG study: None Technical aspects: This EEG study was done with scalp electrodes positioned according to the 10-20 International system of electrode placement. Electrical activity was acquired at a sampling rate of 500Hz  and reviewed with a high frequency filter of 70Hz  and a low frequency filter of 1Hz . EEG data were recorded continuously and digitally stored. Description: The posterior dominant rhythm consists of 7 Hz activity of moderate voltage (25-35 uV) seen predominantly in posterior head regions, symmetric and reactive to eye opening and eye closing. EEG showed continuous generalized 5 to 6 Hz theta slowing.  Hyperventilation and photic stimulation were not performed.   ABNORMALITY - Continuous slow, generalized - Background slow IMPRESSION: This study is suggestive of moderate diffuse encephalopathy, nonspecific etiology. No seizures or epileptiform discharges were seen throughout the recording. [name]   ECHOCARDIOGRAM LIMITED  Result Date: 08/01/2020    ECHOCARDIOGRAM REPORT   Patient Name:   TYRON NAKAO Date of Exam: 08/01/2020 Medical Rec #:  [id number]    Height:       73.0 in Accession #:    [id number]   Weight:       277.3 lb Date of Birth:  1955/07/11   BSA:          2.472 m Patient Age:    68 years     BP:           127/89 mmHg Patient Gender: M            HR:           60 bpm. Exam Location:  Inpatient Procedure: 2D Echo, Cardiac Doppler and Color Doppler Indications:    CVA  History:        Patient has no prior history of Echocardiogram examinations.                 Risk Factors:Hypertension and Dyslipidemia.  Sonographer:    Luisa Hart RDCS Referring Phys: [id number] EKTA V PATEL  Sonographer Comments: Technically difficult study due to poor echo windows and patient is morbidly obese. Image acquisition challenging due to uncooperative patient and Image acquisition challenging due to patient behavioral factors. IMPRESSIONS  1. No subcostal images.  2. Left  ventricular ejection fraction, by estimation, is 20 to 25%. The left ventricle has severely decreased function. The left ventricle demonstrates global hypokinesis. The left ventricular internal cavity size was severely dilated. Left ventricular diastolic parameters are indeterminate.  3. Right ventricular systolic function is normal. The right ventricular size is normal. There is mildly elevated pulmonary artery systolic pressure.  4. The pericardial effusion is posterior to the left ventricle.  5. The mitral valve is normal in structure. Mild mitral valve regurgitation. No evidence of mitral stenosis.  6. The aortic valve is tricuspid. Aortic valve regurgitation is not visualized. Mild aortic valve sclerosis is present, with no evidence of aortic valve stenosis.  7. The inferior vena cava is normal in size with greater than 50% respiratory variability, suggesting right atrial pressure of 3 mmHg. FINDINGS  Left Ventricle: Left ventricular ejection fraction, by estimation, is 20 to 25%. The left ventricle has severely decreased function. The left ventricle demonstrates global hypokinesis. The left ventricular internal cavity size was severely dilated. There is no left ventricular hypertrophy. Left ventricular diastolic parameters are indeterminate. Right Ventricle: The right ventricular size is normal. No increase in right ventricular wall thickness. Right ventricular systolic function  is normal. There is mildly elevated pulmonary artery systolic pressure. The tricuspid regurgitant velocity is 3.03  m/s, and with an assumed right atrial pressure of 3 mmHg, the estimated right ventricular systolic pressure is 09.3 mmHg. Left Atrium: Left atrial size was normal in size. Right Atrium: Right atrial size was normal in size. Pericardium: Trivial pericardial effusion is present. The pericardial effusion is posterior to the left ventricle. Mitral Valve: The mitral valve is normal in structure. Mild mitral valve  regurgitation. No evidence of mitral valve stenosis. Tricuspid Valve: The tricuspid valve is normal in structure. Tricuspid valve regurgitation is mild . No evidence of tricuspid stenosis. Aortic Valve: The aortic valve is tricuspid. Aortic valve regurgitation is not visualized. Mild aortic valve sclerosis is present, with no evidence of aortic valve stenosis. Pulmonic Valve: The pulmonic valve was normal in structure. Pulmonic valve regurgitation is not visualized. No evidence of pulmonic stenosis. Aorta: The aortic root is normal in size and structure. Venous: The inferior vena cava is normal in size with greater than 50% respiratory variability, suggesting right atrial pressure of 3 mmHg. IAS/Shunts: The interatrial septum was not well visualized. Additional Comments: No subcostal images.  LEFT VENTRICLE PLAX 2D LVIDd:         7.20 cm LVIDs:         6.10 cm LV PW:         1.40 cm LV IVS:        0.80 cm LVOT diam:     2.30 cm LVOT Area:     4.15 cm  RIGHT VENTRICLE RV S prime:     13.40 cm/s LEFT ATRIUM            Index       RIGHT ATRIUM           Index LA Vol (A4C): 118.0 ml 47.73 ml/m RA Area:     26.60 cm                                    RA Volume:   101.00 ml 40.85 ml/m                        PULMONIC VALVE AORTA                 PV Vmax:       0.86 m/s Ao Root diam: 3.70 cm PV Vmean:      56.900 cm/s                       PV VTI:        0.126 m                       PV Peak grad:  3.0 mmHg                       PV Mean grad:  1.0 mmHg  TRICUSPID VALVE TR Peak grad:   36.7 mmHg TR Vmax:        303.00 cm/s  SHUNTS Systemic Diam: 2.30 cm Jenkins Rouge MD Electronically signed by Jenkins Rouge MD Signature Date/Time: 08/01/2020/4:10:01 PM    Final     Scheduled Meds: .  stroke: mapping our early stages of recovery book   Does not apply Once  . aspirin  300 mg Rectal Daily  .  azithromycin  500 mg Oral Daily  . gatifloxacin  1 drop Both Eyes QID  . heparin  5,000 Units Subcutaneous Q8H  . insulin aspart   0-15 Units Subcutaneous TID WC  . insulin aspart  0-5 Units Subcutaneous QHS  . insulin detemir  10 Units Subcutaneous BID  . lithium carbonate  900 mg Oral Daily  . pantoprazole (PROTONIX) IV  40 mg Intravenous Q12H  . QUEtiapine  400 mg Oral QHS  . sodium chloride flush  3 mL Intravenous Q12H  . thiamine injection  100 mg Intravenous Daily   Continuous Infusions: . sodium chloride       LOS: 2 days    Time spent: 35 mins    Tigerlily Christine, MD [facility]   If 7PM-7AM, please contact night-coverage

## 2020-08-02 NOTE — Plan of Care (Signed)
Observer at bedside. Patient has not current iv access in place. Alert to self, resting in bed with rr even and unlabored.  Refusing telemetry.No other needs identified. Will continue to monitor.  Problem: Education: Goal: Knowledge of General Education information will improve Description: Including pain rating scale, medication(s)/side effects and non-pharmacologic comfort measures Outcome: Progressing   Problem: Health Behavior/Discharge Planning: Goal: Ability to manage health-related needs will improve Outcome: Progressing   Problem: Clinical Measurements: Goal: Ability to maintain clinical measurements within normal limits will improve Outcome: Progressing Goal: Will remain free from infection Outcome: Progressing Goal: Diagnostic test results will improve Outcome: Progressing Goal: Respiratory complications will improve Outcome: Progressing Goal: Cardiovascular complication will be avoided Outcome: Progressing   Problem: Activity: Goal: Risk for activity intolerance will decrease Outcome: Progressing   Problem: Nutrition: Goal: Adequate nutrition will be maintained Outcome: Progressing   Problem: Coping: Goal: Level of anxiety will decrease Outcome: Progressing   Problem: Elimination: Goal: Will not experience complications related to bowel motility Outcome: Progressing Goal: Will not experience complications related to urinary retention Outcome: Progressing   Problem: Pain Managment: Goal: General experience of comfort will improve Outcome: Progressing   Problem: Safety: Goal: Ability to remain free from injury will improve Outcome: Progressing   Problem: Skin Integrity: Goal: Risk for impaired skin integrity will decrease Outcome: Progressing

## 2020-08-03 ENCOUNTER — Inpatient Hospital Stay (HOSPITAL_COMMUNITY): Payer: Medicare HMO

## 2020-08-03 DIAGNOSIS — R4182 Altered mental status, unspecified: Secondary | ICD-10-CM | POA: Diagnosis not present

## 2020-08-03 DIAGNOSIS — I501 Left ventricular failure: Secondary | ICD-10-CM

## 2020-08-03 DIAGNOSIS — R0602 Shortness of breath: Secondary | ICD-10-CM | POA: Diagnosis not present

## 2020-08-03 DIAGNOSIS — E7800 Pure hypercholesterolemia, unspecified: Secondary | ICD-10-CM | POA: Diagnosis not present

## 2020-08-03 DIAGNOSIS — R079 Chest pain, unspecified: Secondary | ICD-10-CM | POA: Diagnosis not present

## 2020-08-03 DIAGNOSIS — E1159 Type 2 diabetes mellitus with other circulatory complications: Secondary | ICD-10-CM | POA: Diagnosis not present

## 2020-08-03 DIAGNOSIS — I209 Angina pectoris, unspecified: Secondary | ICD-10-CM | POA: Diagnosis not present

## 2020-08-03 LAB — CBC
HCT: 39.4 % (ref 39.0–52.0)
Hemoglobin: 11.6 g/dL — ABNORMAL LOW (ref 13.0–17.0)
MCH: 25.4 pg — ABNORMAL LOW (ref 26.0–34.0)
MCHC: 29.4 g/dL — ABNORMAL LOW (ref 30.0–36.0)
MCV: 86.4 fL (ref 80.0–100.0)
Platelets: 162 10*3/uL (ref 150–400)
RBC: 4.56 MIL/uL (ref 4.22–5.81)
RDW: 14.1 % (ref 11.5–15.5)
WBC: 5.2 10*3/uL (ref 4.0–10.5)
nRBC: 0 % (ref 0.0–0.2)

## 2020-08-03 LAB — LYME DISEASE, WESTERN BLOT
IgG P18 Ab.: ABSENT
IgG P23 Ab.: ABSENT
IgG P28 Ab.: ABSENT
IgG P30 Ab.: ABSENT
IgG P39 Ab.: ABSENT
IgG P41 Ab.: ABSENT
IgG P45 Ab.: ABSENT
IgG P58 Ab.: ABSENT
IgG P66 Ab.: ABSENT
IgG P93 Ab.: ABSENT
IgM P23 Ab.: ABSENT
IgM P39 Ab.: ABSENT
IgM P41 Ab.: ABSENT
Lyme IgG Wb: NEGATIVE
Lyme IgM Wb: NEGATIVE

## 2020-08-03 LAB — COMPREHENSIVE METABOLIC PANEL
ALT: 35 U/L (ref 0–44)
AST: 33 U/L (ref 15–41)
Albumin: 3.3 g/dL — ABNORMAL LOW (ref 3.5–5.0)
Alkaline Phosphatase: 39 U/L (ref 38–126)
Anion gap: 10 (ref 5–15)
BUN: 15 mg/dL (ref 8–23)
CO2: 25 mmol/L (ref 22–32)
Calcium: 8.7 mg/dL — ABNORMAL LOW (ref 8.9–10.3)
Chloride: 107 mmol/L (ref 98–111)
Creatinine, Ser: 1.19 mg/dL (ref 0.61–1.24)
GFR, Estimated: >60 mL/min (ref >60)
Glucose, Bld: 116 mg/dL — ABNORMAL HIGH (ref 70–99)
Potassium: 4 mmol/L (ref 3.5–5.1)
Sodium: 142 mmol/L (ref 135–145)
Total Bilirubin: 0.5 mg/dL (ref 0.3–1.2)
Total Protein: 6 g/dL — ABNORMAL LOW (ref 6.5–8.1)

## 2020-08-03 LAB — ECHOCARDIOGRAM LIMITED
Height: 73 in
Weight: 4437.42 oz

## 2020-08-03 LAB — GLUCOSE, CAPILLARY
Glucose-Capillary: 102 mg/dL — ABNORMAL HIGH (ref 70–99)
Glucose-Capillary: 187 mg/dL — ABNORMAL HIGH (ref 70–99)
Glucose-Capillary: 120 mg/dL — ABNORMAL HIGH (ref 70–99)
Glucose-Capillary: 171 mg/dL — ABNORMAL HIGH (ref 70–99)

## 2020-08-03 LAB — MAGNESIUM: Magnesium: 2 mg/dL (ref 1.7–2.4)

## 2020-08-03 LAB — PHOSPHORUS: Phosphorus: 3.9 mg/dL (ref 2.5–4.6)

## 2020-08-03 IMAGING — MR MR MRA HEAD W/O CM
8 of 10 series · 33 of 48 positions shown · IV contrast (agent unspecified)
Comparison: CT head without contrast [DATE]. MR head without
and with contrast [DATE]

CLINICAL DATA: Abnormal speech. Patient was unable to tolerate
additional imaging including coronal images or postcontrast images.

EXAM:
MRI HEAD WITHOUT CONTRAST
MRA HEAD WITHOUT CONTRAST
TECHNIQUE: Multiplanar, multiecho pulse sequences of the brain and surrounding
structures were obtained without intravenous contrast. Angiographic
images of the head were obtained using MRA technique without
contrast.

[Series 5: dwi_tracew · axial · 3.0mm · 1.08mm/px · z∈[-7,+132]mm · 8 of 102 slices shown]
[im 1/102]
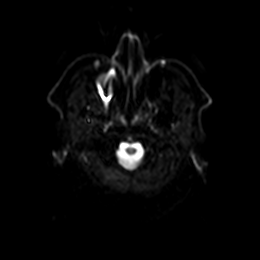
[im 13/102]
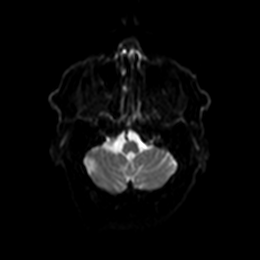
[im 26/102]
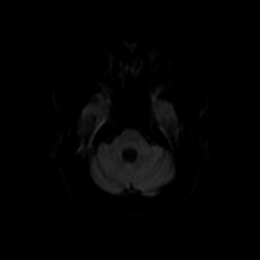
[im 38/102]
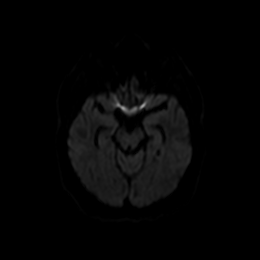
[im 64/102]
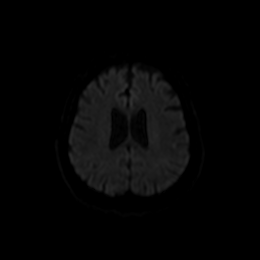
[im 76/102]
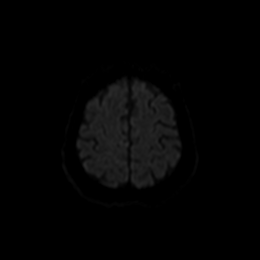
[im 89/102]
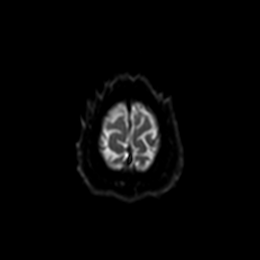
[im 102/102]
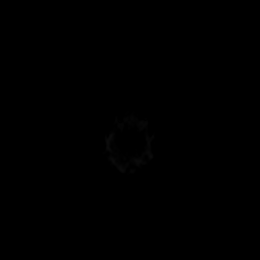

[Series 6: dwi_adc · axial · 3.0mm · 1.08mm/px · z∈[-7,+132]mm · 5 of 51 slices shown]
[im 1/51]
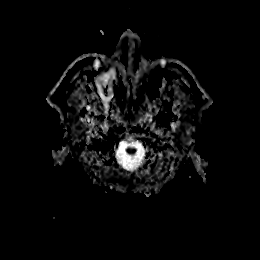
[im 13/51]
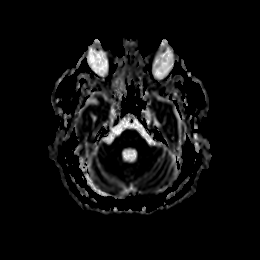
[im 26/51]
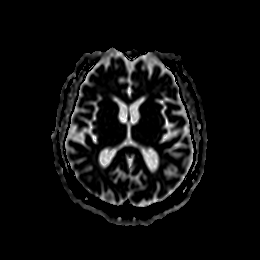
[im 38/51]
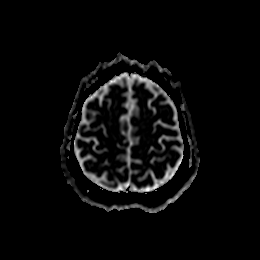
[im 51/51]
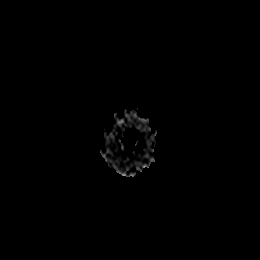

[Series 7: T2 · sagittal · 5.0mm · 0.47mm/px · 2 of 24 slices shown (1 of 2)]
[im 1/24]
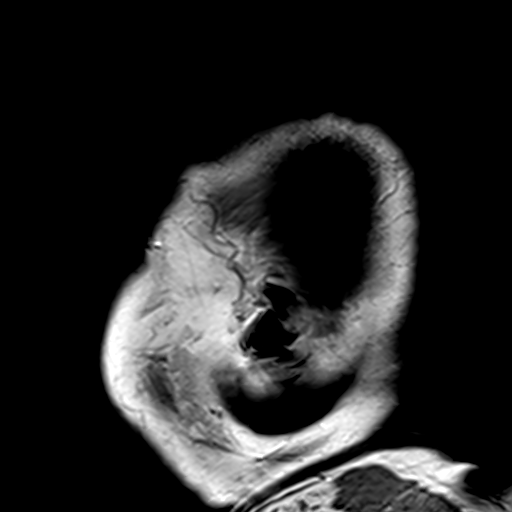
[im 24/24]
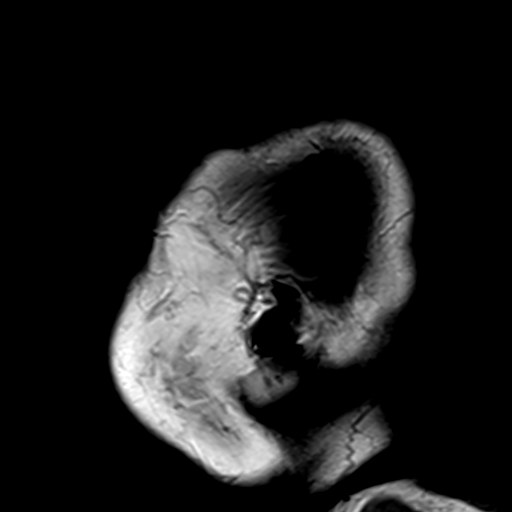

[Series 8: T2 · axial · 5.0mm · 0.45mm/px · z∈[-23,+127]mm · 2 of 26 slices shown (2 of 2)]
[im 1/26]
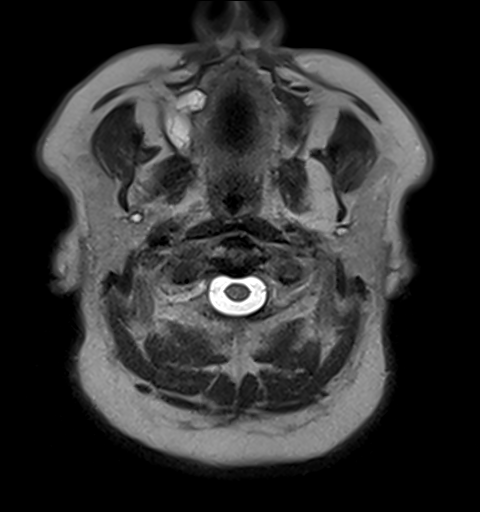
[im 26/26]
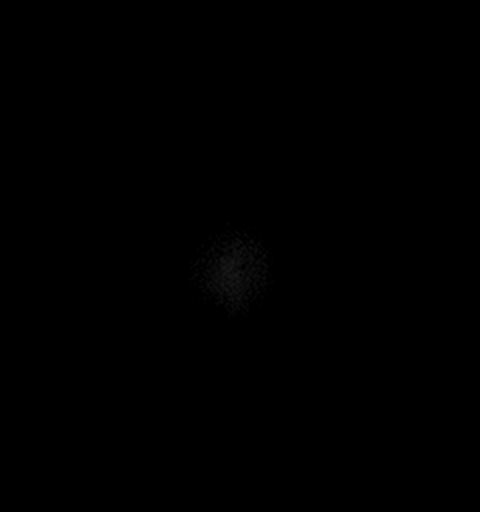

[Series 9: GRE · axial · 3.0mm · 0.45mm/px · z∈[-17,+122]mm · 5 of 51 slices shown]
[im 1/51]
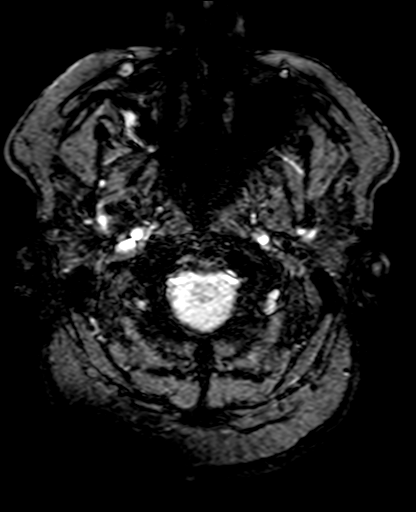
[im 13/51]
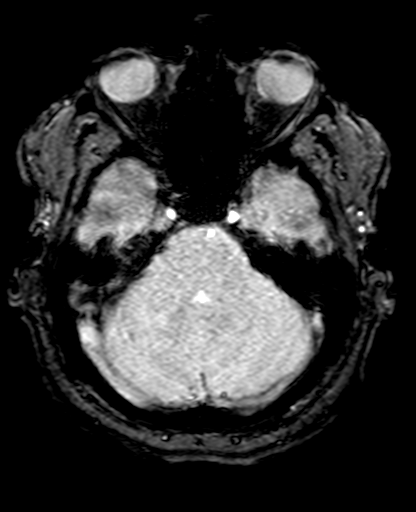
[im 26/51]
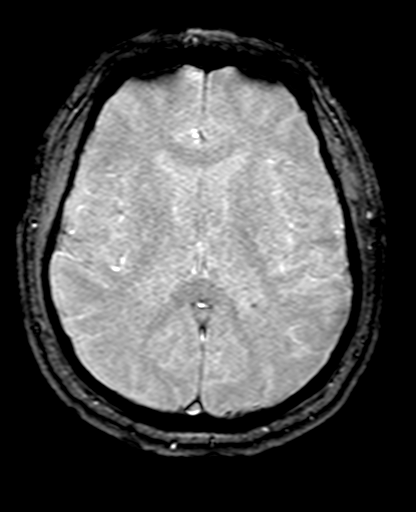
[im 38/51]
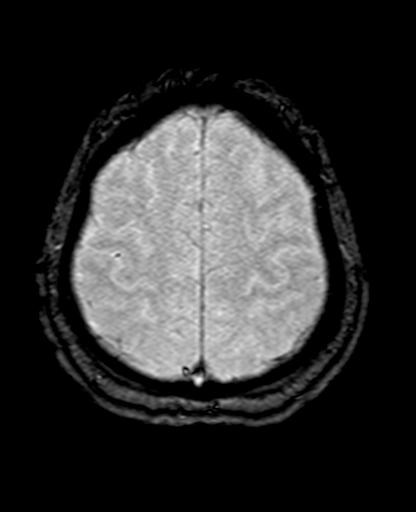
[im 51/51]
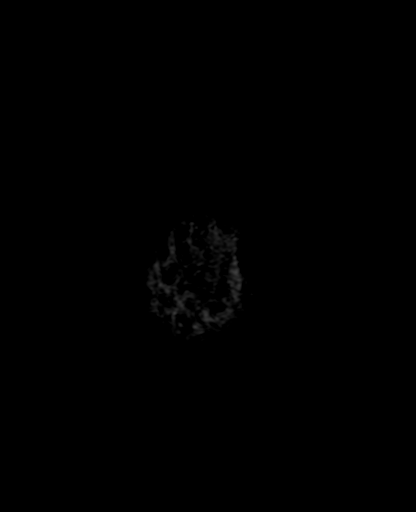

[Series 10: FLAIR · axial · 3.0mm · 0.86mm/px · z∈[-18,+121]mm · 5 of 51 slices shown]
[im 1/51]
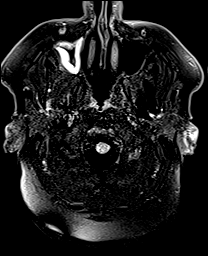
[im 13/51]
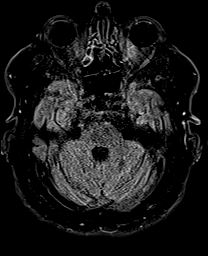
[im 26/51]
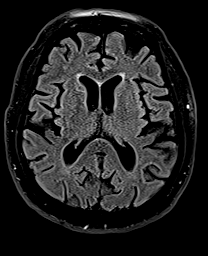
[im 38/51]
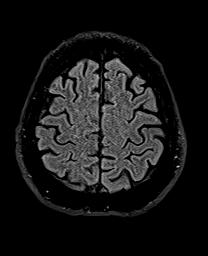
[im 51/51]
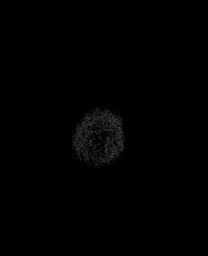

[Series 11: tof_fl3d_tra_p2_multi-slab · axial · 0.5mm · 0.35mm/px · z∈[-10,+22]mm · 4 of 168 slices shown]
[im 12/168]
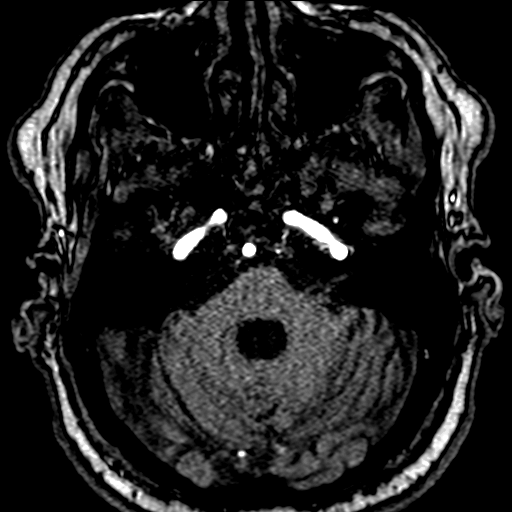
[im 34/168]
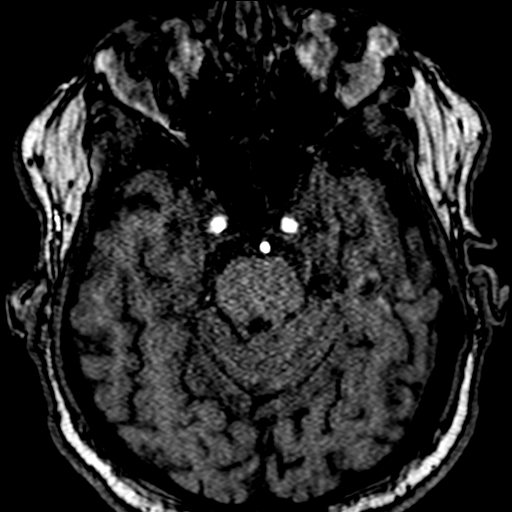
[im 56/168]
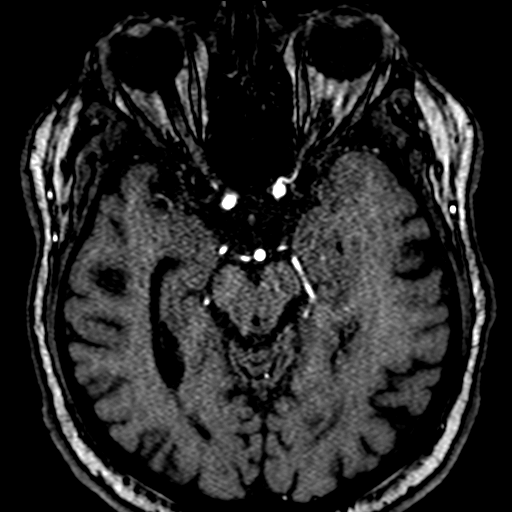
[im 78/168]
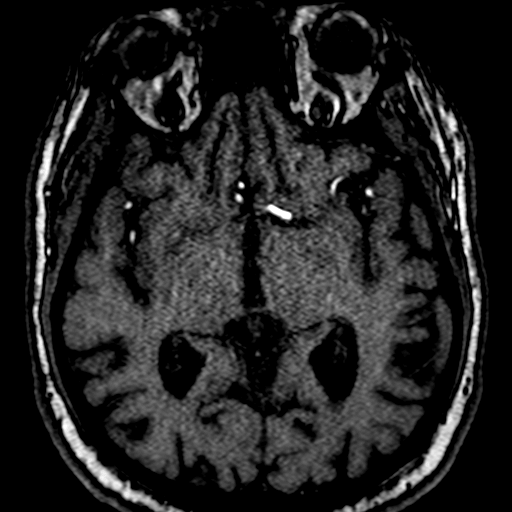

[Series 15: T1 · sagittal · 5.0mm · 0.94mm/px · 2 of 24 slices shown]
[im 1/24]
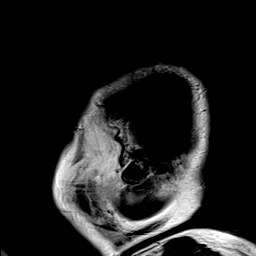
[im 24/24]
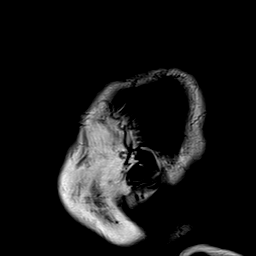

[33 of 48 positions shown; findings below may reference images not displayed]

FINDINGS: MRI HEAD FINDINGS

Brain: Axial diffusion-weighted images demonstrate no acute or
subacute infarction. Patient did

not tolerate coronal diffusion images. Mild atrophy and white matter
changes are stable and within normal limits for age. The ventricles
are of normal size. No significant extraaxial fluid collection is
present.

The internal auditory canals are within normal limits. The brainstem
and cerebellum are within normal limits.

Vascular: Flow is present in the major intracranial arteries.

Skull and upper cervical spine: The craniocervical junction is
normal. Disc disease present at C3-4. Marrow signal is somewhat
depressed diffusely. No discrete lesions present.

Sinuses/Orbits: Mild mucosal thickening is present in the right
maxillary sinus and scattered throughout the right ethmoid air
cells. No fluid levels are present. Left sphenoid sinus mucosal
thickening noted. There is some fluid in the right mastoid air
cells. No obstructing nasopharyngeal lesion is present.

The globes and orbits are within normal limits.

MRA HEAD FINDINGS

Mild tortuosity is present high cervical segments without
significant stenosis. There is no stenosis through the ICA termini.
A1 and M1 segments are within normal limits. No definite anterior
communicating artery is present. MCA bifurcations are intact. ACA
and MCA branch vessels are within normal limits. There is some
attenuation due to artifact.

Vertebrobasilar junction visualized. Lower vertebral arteries and
PICA origins not seen. Left AICA is dominant. Basilar artery is
normal. Both posterior cerebral arteries originate from basilar tip.
PCA branch vessels are within normal limits.
IMPRESSION: 1. Normal MRI appearance of the brain for age.
2. Mild sinus disease and right mastoid effusion. No obstructing
nasopharyngeal lesion is present.
3. Normal MRA Circle of Willis without evidence for significant
proximal stenosis, aneurysm, or branch vessel occlusion.

## 2020-08-03 IMAGING — MR MR HEAD W/O CM
8 of 10 series · 33 of 48 positions shown · IV contrast (agent unspecified)
Comparison: CT head without contrast [DATE]. MR head without
and with contrast [DATE]

CLINICAL DATA: Abnormal speech. Patient was unable to tolerate
additional imaging including coronal images or postcontrast images.

EXAM:
MRI HEAD WITHOUT CONTRAST
MRA HEAD WITHOUT CONTRAST
TECHNIQUE: Multiplanar, multiecho pulse sequences of the brain and surrounding
structures were obtained without intravenous contrast. Angiographic
images of the head were obtained using MRA technique without
contrast.

[Series 5: dwi_tracew · axial · 3.0mm · 1.08mm/px · z∈[-7,+132]mm · 8 of 102 slices shown]
[im 1/102]
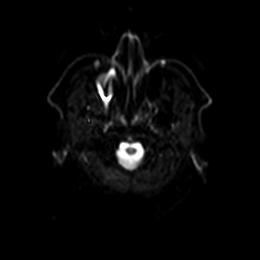
[im 13/102]
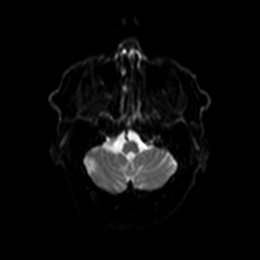
[im 26/102]
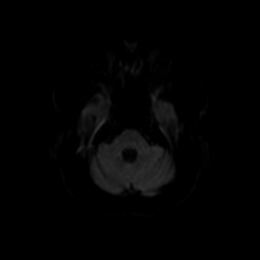
[im 38/102]
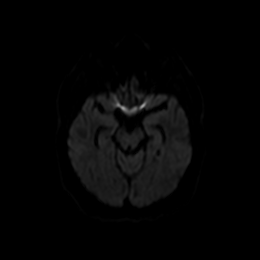
[im 64/102]
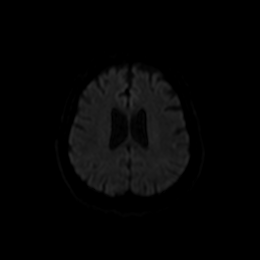
[im 76/102]
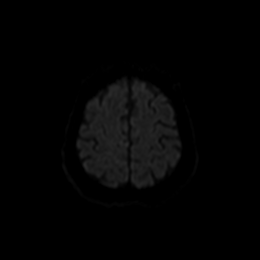
[im 89/102]
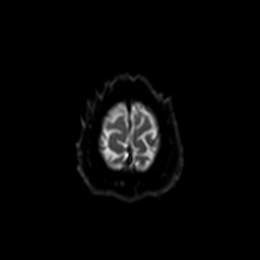
[im 102/102]
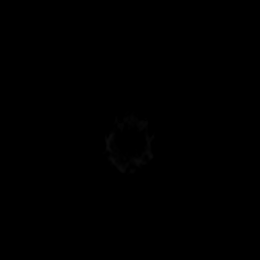

[Series 6: dwi_adc · axial · 3.0mm · 1.08mm/px · z∈[-7,+132]mm · 5 of 51 slices shown]
[im 1/51]
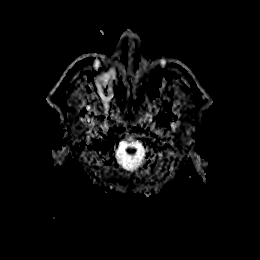
[im 13/51]
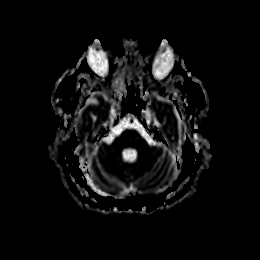
[im 26/51]
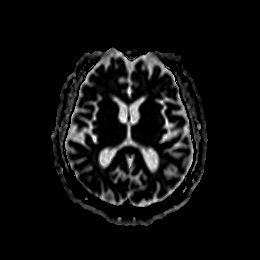
[im 38/51]
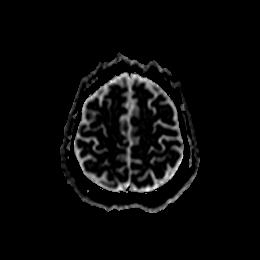
[im 51/51]
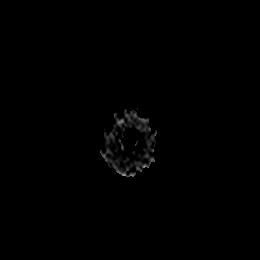

[Series 7: T2 · sagittal · 5.0mm · 0.47mm/px · 2 of 24 slices shown (1 of 2)]
[im 1/24]
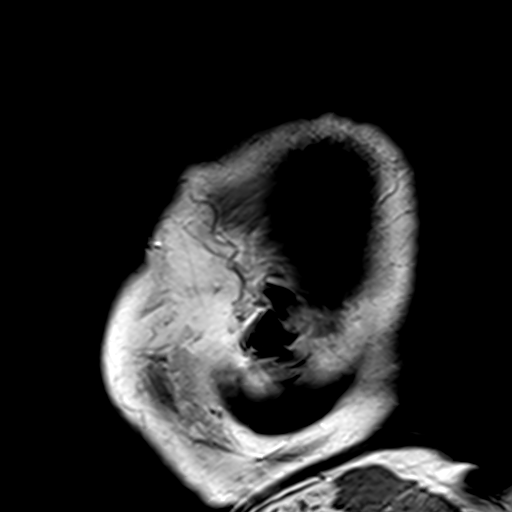
[im 24/24]
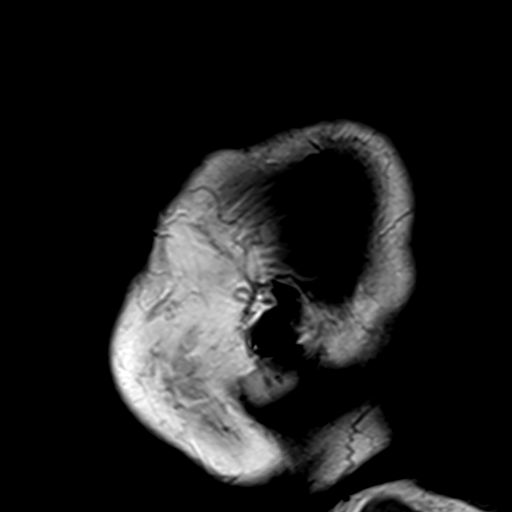

[Series 8: T2 · axial · 5.0mm · 0.45mm/px · z∈[-23,+127]mm · 2 of 26 slices shown (2 of 2)]
[im 1/26]
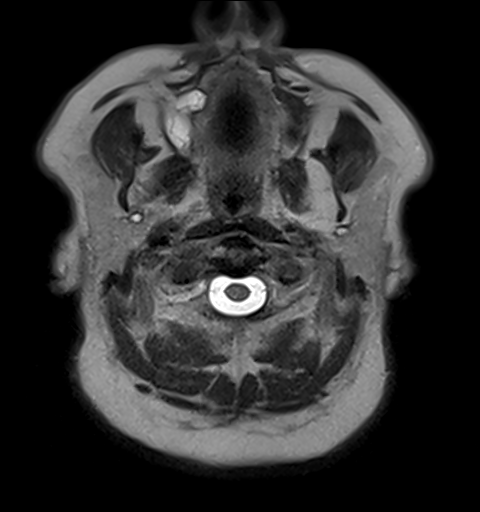
[im 26/26]
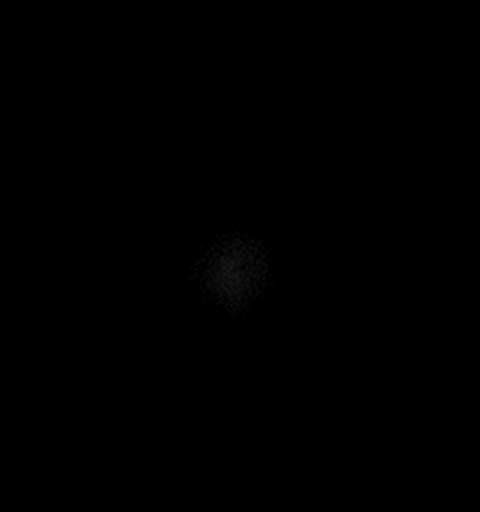

[Series 9: GRE · axial · 3.0mm · 0.45mm/px · z∈[-17,+122]mm · 5 of 51 slices shown]
[im 1/51]
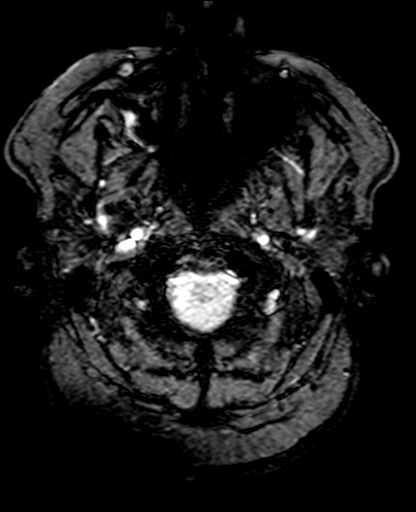
[im 13/51]
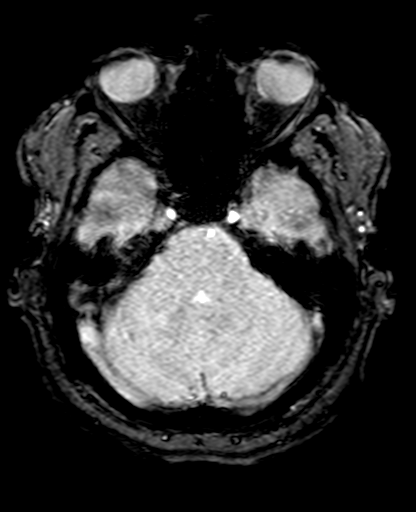
[im 26/51]
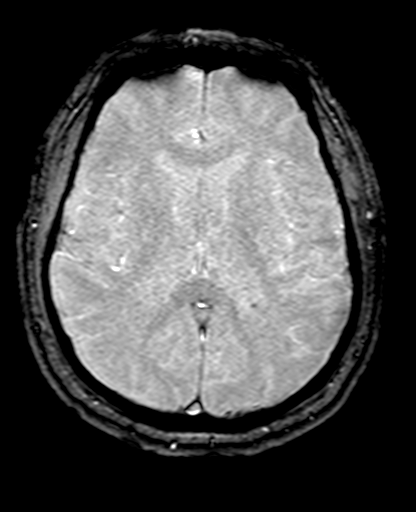
[im 38/51]
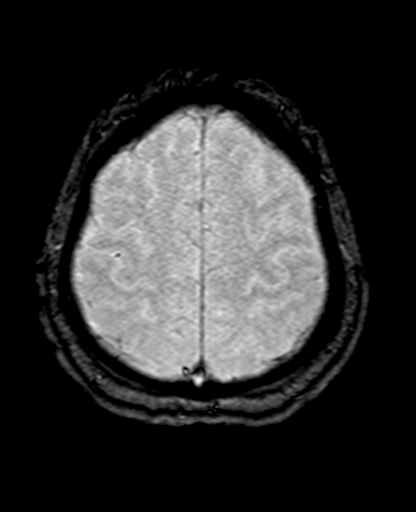
[im 51/51]
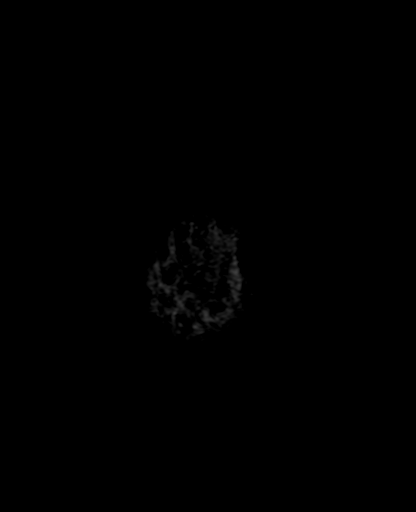

[Series 10: FLAIR · axial · 3.0mm · 0.86mm/px · z∈[-18,+121]mm · 5 of 51 slices shown]
[im 1/51]
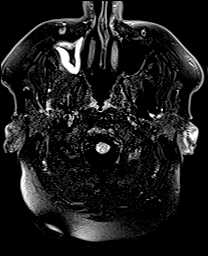
[im 13/51]
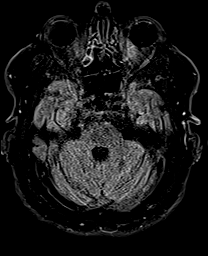
[im 26/51]
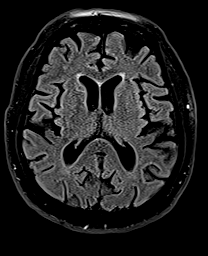
[im 38/51]
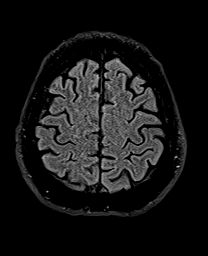
[im 51/51]
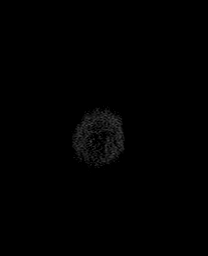

[Series 11: tof_fl3d_tra_p2_multi-slab · axial · 0.5mm · 0.35mm/px · z∈[-10,+22]mm · 4 of 168 slices shown]
[im 12/168]
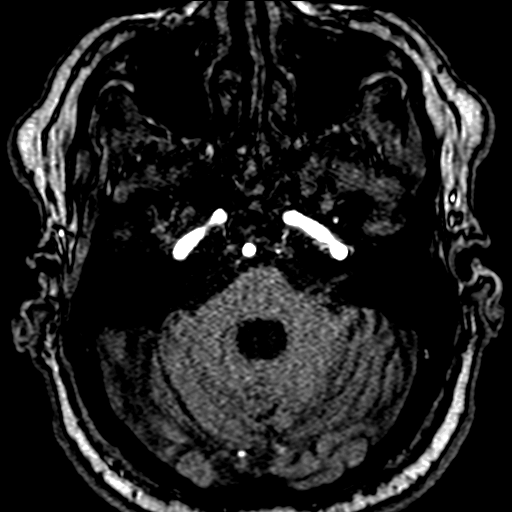
[im 34/168]
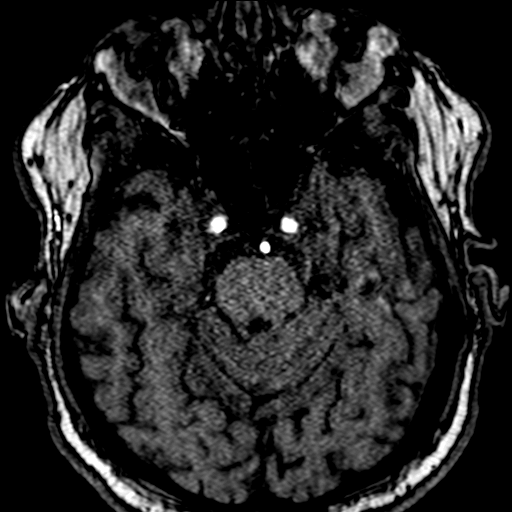
[im 56/168]
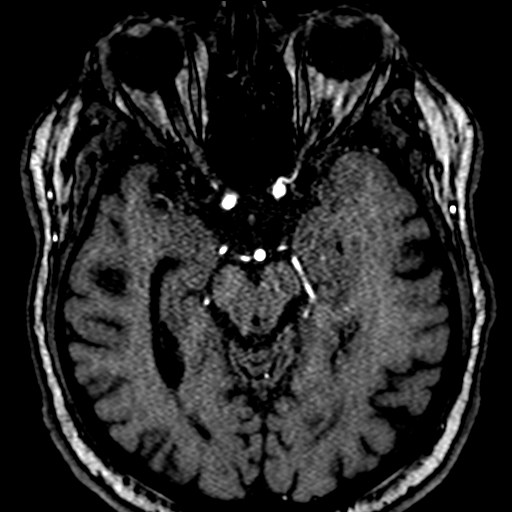
[im 78/168]
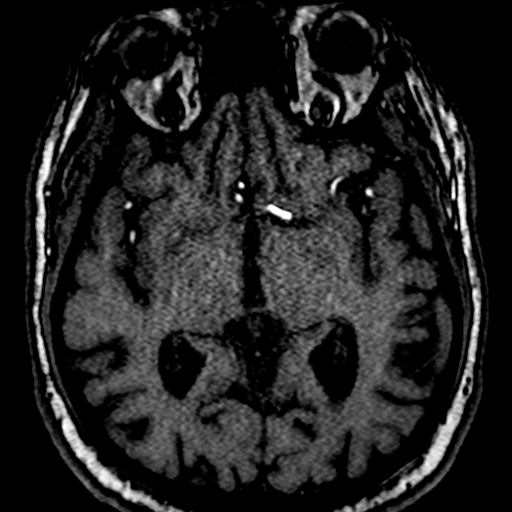

[Series 15: T1 · sagittal · 5.0mm · 0.94mm/px · 2 of 24 slices shown]
[im 1/24]
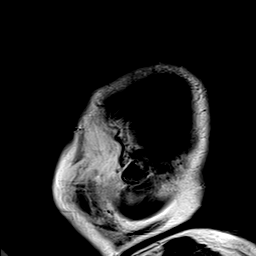
[im 24/24]
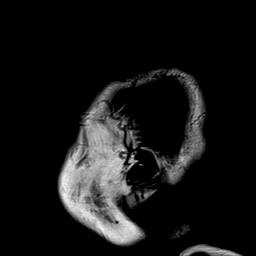

[33 of 48 positions shown; findings below may reference images not displayed]

FINDINGS: MRI HEAD FINDINGS

Brain: Axial diffusion-weighted images demonstrate no acute or
subacute infarction. Patient did

not tolerate coronal diffusion images. Mild atrophy and white matter
changes are stable and within normal limits for age. The ventricles
are of normal size. No significant extraaxial fluid collection is
present.

The internal auditory canals are within normal limits. The brainstem
and cerebellum are within normal limits.

Vascular: Flow is present in the major intracranial arteries.

Skull and upper cervical spine: The craniocervical junction is
normal. Disc disease present at C3-4. Marrow signal is somewhat
depressed diffusely. No discrete lesions present.

Sinuses/Orbits: Mild mucosal thickening is present in the right
maxillary sinus and scattered throughout the right ethmoid air
cells. No fluid levels are present. Left sphenoid sinus mucosal
thickening noted. There is some fluid in the right mastoid air
cells. No obstructing nasopharyngeal lesion is present.

The globes and orbits are within normal limits.

MRA HEAD FINDINGS

Mild tortuosity is present high cervical segments without
significant stenosis. There is no stenosis through the ICA termini.
A1 and M1 segments are within normal limits. No definite anterior
communicating artery is present. MCA bifurcations are intact. ACA
and MCA branch vessels are within normal limits. There is some
attenuation due to artifact.

Vertebrobasilar junction visualized. Lower vertebral arteries and
PICA origins not seen. Left AICA is dominant. Basilar artery is
normal. Both posterior cerebral arteries originate from basilar tip.
PCA branch vessels are within normal limits.
IMPRESSION: 1. Normal MRI appearance of the brain for age.
2. Mild sinus disease and right mastoid effusion. No obstructing
nasopharyngeal lesion is present.
3. Normal MRA Circle of Willis without evidence for significant
proximal stenosis, aneurysm, or branch vessel occlusion.

## 2020-08-03 MED ORDER — CARVEDILOL 3.125 MG PO TABS
3.1250 mg | ORAL_TABLET | Freq: Once | ORAL | Status: AC
  Administered 2020-08-03: 3.125 mg via ORAL
  Filled 2020-08-03: qty 1

## 2020-08-03 MED ORDER — CARVEDILOL 6.25 MG PO TABS
6.2500 mg | ORAL_TABLET | Freq: Two times a day (BID) | ORAL | Status: DC
  Administered 2020-08-03 – 2020-08-09 (×11): 6.25 mg via ORAL
  Filled 2020-08-03 (×11): qty 1

## 2020-08-03 MED ORDER — ATORVASTATIN CALCIUM 20 MG PO TABS
20.0000 mg | ORAL_TABLET | Freq: Every day | ORAL | Status: DC
  Administered 2020-08-03 – 2020-08-09 (×7): 20 mg via ORAL
  Filled 2020-08-03 (×8): qty 1

## 2020-08-03 MED ORDER — PERFLUTREN LIPID MICROSPHERE
1.0000 mL | INTRAVENOUS | Status: AC | PRN
  Administered 2020-08-03: 2 mL via INTRAVENOUS
  Filled 2020-08-03: qty 10

## 2020-08-03 MED ORDER — HEPARIN BOLUS VIA INFUSION
2000.0000 [IU] | Freq: Once | INTRAVENOUS | Status: AC
  Administered 2020-08-03: 2000 [IU] via INTRAVENOUS
  Filled 2020-08-03: qty 2000

## 2020-08-03 MED ORDER — HEPARIN (PORCINE) 25000 UT/250ML-% IV SOLN
2150.0000 [IU]/h | INTRAVENOUS | Status: DC
  Administered 2020-08-03: 1500 [IU]/h via INTRAVENOUS
  Administered 2020-08-04: 1850 [IU]/h via INTRAVENOUS
  Administered 2020-08-05 – 2020-08-06 (×2): 2150 [IU]/h via INTRAVENOUS
  Filled 2020-08-03 (×6): qty 250

## 2020-08-03 NOTE — Progress Notes (Signed)
Occupational Therapy Treatment Patient Details Name: Matthew Barnes MRN: [id number] DOB: 06/12/1955 Today's Date: 08/03/2020    History of present illness 65 year old male presents with difficulty finding words and completing sentences. CT head without no acute intracranial abnormality. PMH: bipolar disorder II , diabetes mellitus, hyperlipidemia, hypertension, hypothyroidism.   OT comments  Pt progressing slowly towards acute OT goals. Needs up to min A with LB ADLs and functional mobility. Remains with impaired cognition impacting ability to safely complete basic ADLs. Pt reports he lives alone. At this time updating d/c rec to [facility] at d/c prior to returning home.    Follow Up Recommendations  SNF;Supervision/Assistance - 24 hour    Equipment Recommendations  Other (comment) (defer to next venue)    Recommendations for Other Services      Precautions / Restrictions Precautions Precautions: Fall Restrictions Weight Bearing Restrictions: No       Mobility Bed Mobility Overal bed mobility: Needs Assistance Bed Mobility: Supine to Sit;Sit to Supine     Supine to sit: Supervision Sit to supine: Supervision   General bed mobility comments: pt comes to sitting with supv, appears tired, difficulty maintaining eyes opened for prolonged period of time, supv to return to supine    Transfers Overall transfer level: Needs assistance Equipment used: Rolling walker (2 wheeled) Transfers: Sit to/from Stand Sit to Stand: Min guard;Min assist         General transfer comment: assist to manage placement of rw and to stabilize rw as pt with poor technique.    Balance Overall balance assessment: Needs assistance Sitting-balance support: Feet supported;No upper extremity supported Sitting balance-Leahy Scale: Fair     Standing balance support: During functional activity Standing balance-Leahy Scale: Fair Standing balance comment: rw utilized to walk in the room, navigating turns  and obstacles.                           ADL either performed or assessed with clinical judgement   ADL Overall ADL's : Needs assistance/impaired                         Toilet Transfer: Min guard;Minimal assistance;Ambulation;BSC;RW Toilet Transfer Details (indicate cue type and reason): mostly min guard, light steadying assist on turns         Functional mobility during ADLs: Min guard;Minimal assistance;Rolling walker General ADL Comments: Pt remains lethargic and with decreased cognition + some impulsivity     Vision   Additional Comments: Pt talked about lunch tray on hospital table. Table cluttered with a few items but no lunch tray present   Perception     Praxis      Cognition Arousal/Alertness: Lethargic Behavior During Therapy: Flat affect                                   General Comments: Pt lethargic throughout session but able to awaken enough to walk in the room. AxO to person, place and year. Stated month as "11/11" focused attention level. decreased safety awareness, impulsive at times. Able to follow 1 step commands consistently, multistep inconsistent        Exercises     Shoulder Instructions       General Comments      Pertinent Vitals/ Pain       Pain Assessment: No/denies pain  Home Living  Prior Functioning/Environment              Frequency  Min 2X/week        Progress Toward Goals  OT Goals(current goals can now be found in the care plan section)  Progress towards OT goals: Progressing toward goals  Acute Rehab OT Goals Patient Stated Goal: unable OT Goal Formulation: Patient unable to participate in goal setting Time For Goal Achievement: 08/15/20 Potential to Achieve Goals: Good ADL Goals Pt Will Perform Lower Body Dressing: with supervision;sit to/from stand Pt Will Transfer to Toilet: with supervision;ambulating;regular  height toilet;grab bars Pt Will Perform Toileting - Clothing Manipulation and hygiene: with supervision;sit to/from stand Additional ADL Goal #1: Patient will stand at sink to perform grooming task as evidence of improving activity tolerance  Plan Discharge plan needs to be updated    Co-evaluation                 AM-PAC OT "6 Clicks" Daily Activity     Outcome Measure   Help from another person eating meals?: A Little Help from another person taking care of personal grooming?: A Little Help from another person toileting, which includes using toliet, bedpan, or urinal?: A Lot Help from another person bathing (including washing, rinsing, drying)?: A Lot Help from another person to put on and taking off regular upper body clothing?: A Little Help from another person to put on and taking off regular lower body clothing?: A Lot 6 Click Score: 15    End of Session Equipment Utilized During Treatment: Rolling walker  OT Visit Diagnosis: Unsteadiness on feet (R26.81);Other symptoms and signs involving cognitive function   Activity Tolerance Patient limited by lethargy;Patient tolerated treatment well   Patient Left in chair;with call bell/phone within reach;with nursing/sitter in room;Other (comment) Freight forwarder present)   Nurse Communication          Time: [phone] OT Time Calculation (min): 14 min  Charges: OT General Charges $OT Visit: 1 Visit OT Treatments $Self Care/Home Management : 8-22 mins  Tyrone Schimke, OT Acute Rehabilitation Services Pager: [phone] Office: [phone]    Hortencia Pilar 08/03/2020, 1:52 PM

## 2020-08-03 NOTE — Consult Note (Signed)
Progress Note  Patient Name: ADRIN Barnes Date of Encounter: 08/03/2020  [facility] Cardiologist: Quay Burow, MD   Subjective   Patient is alert this morning and communicative.  Denies any chest pain or shortness of breath currently.  However reported exertional chest tightness and pressure which have progressively getting worse for months.  He is barely able to walk few steps without getting chest pain and shortness of breath.  Has intermittent orthopnea.  Does endorse eating high salt diet.  Inpatient Medications    Scheduled Meds: .  stroke: mapping our early stages of recovery book   Does not apply Once  . aspirin  300 mg Rectal Daily  . azithromycin  500 mg Oral Daily  . carvedilol  3.125 mg Oral BID WC  . gatifloxacin  1 drop Both Eyes QID  . guaiFENesin  600 mg Oral BID  . heparin  5,000 Units Subcutaneous Q8H  . insulin aspart  0-15 Units Subcutaneous TID WC  . insulin aspart  0-5 Units Subcutaneous QHS  . insulin detemir  10 Units Subcutaneous BID  . lithium carbonate  900 mg Oral Daily  . pantoprazole  40 mg Oral BID AC  . QUEtiapine  400 mg Oral QHS  . sodium chloride flush  3 mL Intravenous Q12H  . thiamine  100 mg Oral Daily   Continuous Infusions: . sodium chloride     PRN Meds: sodium chloride, acetaminophen **OR** acetaminophen, metoprolol tartrate, ondansetron **OR** ondansetron (ZOFRAN) IV, QUEtiapine, sodium chloride flush   Vital Signs    Vitals:   08/02/20 1612 08/02/20 2041 08/03/20 0441 08/03/20 0908  BP: (!) 152/98 (!) 130/92 133/81 (!) 148/88  Pulse: (!) 102 99 97 (!) 101  Resp: 18 20 18 20   Temp: 98.4 F (36.9 C) 97.7 F (36.5 C) 97.9 F (36.6 C) 98.4 F (36.9 C)  TempSrc:  Oral Oral Oral  SpO2: 99% 98% 97% 95%  Weight:      Height:        Intake/Output Summary (Last 24 hours) at 08/03/2020 1118 Last data filed at 08/02/2020 2107 Gross per 24 hour  Intake 440 ml  Output 400 ml  Net 40 ml   Last 3 Weights 07/31/2020 07/30/2020  06/04/2020  Weight (lbs) 277 lb 5.4 oz 291 lb 7.2 oz 291 lb 6.4 oz  Weight (kg) 125.8 kg 132.2 kg 132.178 kg      Telemetry    N/A  ECG    No new tracing   Physical Exam   GEN:  Obese male sitting in recliner no o acute distress.   Neck: No JVD Cardiac: RRR, no murmurs, rubs, or gallops.  Respiratory: Faint bibasilar Rales GI: Soft, nontender, non-distended  MS: No edema; No deformity. Neuro:  Nonfocal  Psych: Normal affect   Labs    High Sensitivity Troponin:   Recent Labs  Lab 07/31/20 0127 07/31/20 0409 07/31/20 0740  TROPONINIHS 32* 33* 34*      Chemistry Recent Labs  Lab 07/31/20 0643 08/01/20 0344 08/02/20 0358 08/03/20 0327  NA 143 143 143 142  K 3.9 3.8 3.7 4.0  CL 107 108 109 107  CO2 24 24 24 25   GLUCOSE 241* 114* 136* 116*  BUN 16 14 18 15   CREATININE 1.18 1.08 1.38* 1.19  CALCIUM 9.6 9.1 8.8* 8.7*  PROT 7.2 6.5  --  6.0*  ALBUMIN 4.1 3.6  --  3.3*  AST 35 38  --  33  ALT 33 32  --  35  ALKPHOS 50 40  --  39  BILITOT 1.0 1.0  --  0.5  GFRNONAA >60 >60 57* >60  ANIONGAP 12 11 10 10      Hematology Recent Labs  Lab 08/01/20 0344 08/02/20 0358 08/03/20 0327  WBC 7.3 5.5 5.2  RBC 4.56 4.36 4.56  HGB 11.7* 10.8* 11.6*  HCT 38.3* 36.4* 39.4  MCV 84.0 83.5 86.4  MCH 25.7* 24.8* 25.4*  MCHC 30.5 29.7* 29.4*  RDW 14.4 14.2 14.1  PLT 186 173 162    BNP Recent Labs  Lab 07/31/20 0007  BNP 372.3*     DDimer  Recent Labs  Lab 07/31/20 0836  DDIMER 1.19*     Radiology    EEG adult  Result Date: 08/01/2020 Matthew Havens, MD     08/01/2020  6:06 PM Patient Name: Matthew Barnes MRN: [id number] Epilepsy Attending: Lora Barnes Referring Physician/Provider: Dr Kerney Elbe Date: 08/01/2020 Duration: 25.13 mins Patient history: 65yo M with speech disturbance and ams. EEG to evaluate for seizure. Level of alertness: Awake AEDs during EEG study: None Technical aspects: This EEG study was done with scalp electrodes positioned according  to the 10-20 International system of electrode placement. Electrical activity was acquired at a sampling rate of 500Hz  and reviewed with a high frequency filter of 70Hz  and a low frequency filter of 1Hz . EEG data were recorded continuously and digitally stored. Description: The posterior dominant rhythm consists of 7 Hz activity of moderate voltage (25-35 uV) seen predominantly in posterior head regions, symmetric and reactive to eye opening and eye closing. EEG showed continuous generalized 5 to 6 Hz theta slowing.  Hyperventilation and photic stimulation were not performed.   ABNORMALITY - Continuous slow, generalized - Background slow IMPRESSION: This study is suggestive of moderate diffuse encephalopathy, nonspecific etiology. No seizures or epileptiform discharges were seen throughout the recording. Matthew Barnes   ECHOCARDIOGRAM LIMITED  Result Date: 08/01/2020    ECHOCARDIOGRAM REPORT   Patient Name:   Matthew Barnes Date of Exam: 08/01/2020 Medical Rec #:  [id number]    Height:       73.0 in Accession #:    [id number]   Weight:       277.3 lb Date of Birth:  27-Apr-1955   BSA:          2.472 m Patient Age:    65 years     BP:           127/89 mmHg Patient Gender: M            HR:           60 bpm. Exam Location:  Inpatient Procedure: 2D Echo, Cardiac Doppler and Color Doppler Indications:    CVA  History:        Patient has no prior history of Echocardiogram examinations.                 Risk Factors:Hypertension and Dyslipidemia.  Sonographer:    Luisa Hart RDCS Referring Phys: [id number] EKTA V PATEL  Sonographer Comments: Technically difficult study due to poor echo windows and patient is morbidly obese. Image acquisition challenging due to uncooperative patient and Image acquisition challenging due to patient behavioral factors. IMPRESSIONS  1. No subcostal images.  2. Left ventricular ejection fraction, by estimation, is 20 to 25%. The left ventricle has severely decreased function. The left ventricle  demonstrates global hypokinesis. The left ventricular internal cavity size was severely dilated. Left ventricular diastolic parameters  are indeterminate.  3. Right ventricular systolic function is normal. The right ventricular size is normal. There is mildly elevated pulmonary artery systolic pressure.  4. The pericardial effusion is posterior to the left ventricle.  5. The mitral valve is normal in structure. Mild mitral valve regurgitation. No evidence of mitral stenosis.  6. The aortic valve is tricuspid. Aortic valve regurgitation is not visualized. Mild aortic valve sclerosis is present, with no evidence of aortic valve stenosis.  7. The inferior vena cava is normal in size with greater than 50% respiratory variability, suggesting right atrial pressure of 3 mmHg. FINDINGS  Left Ventricle: Left ventricular ejection fraction, by estimation, is 20 to 25%. The left ventricle has severely decreased function. The left ventricle demonstrates global hypokinesis. The left ventricular internal cavity size was severely dilated. There is no left ventricular hypertrophy. Left ventricular diastolic parameters are indeterminate. Right Ventricle: The right ventricular size is normal. No increase in right ventricular wall thickness. Right ventricular systolic function is normal. There is mildly elevated pulmonary artery systolic pressure. The tricuspid regurgitant velocity is 3.03  m/s, and with an assumed right atrial pressure of 3 mmHg, the estimated right ventricular systolic pressure is 28.3 mmHg. Left Atrium: Left atrial size was normal in size. Right Atrium: Right atrial size was normal in size. Pericardium: Trivial pericardial effusion is present. The pericardial effusion is posterior to the left ventricle. Mitral Valve: The mitral valve is normal in structure. Mild mitral valve regurgitation. No evidence of mitral valve stenosis. Tricuspid Valve: The tricuspid valve is normal in structure. Tricuspid valve regurgitation  is mild . No evidence of tricuspid stenosis. Aortic Valve: The aortic valve is tricuspid. Aortic valve regurgitation is not visualized. Mild aortic valve sclerosis is present, with no evidence of aortic valve stenosis. Pulmonic Valve: The pulmonic valve was normal in structure. Pulmonic valve regurgitation is not visualized. No evidence of pulmonic stenosis. Aorta: The aortic root is normal in size and structure. Venous: The inferior vena cava is normal in size with greater than 50% respiratory variability, suggesting right atrial pressure of 3 mmHg. IAS/Shunts: The interatrial septum was not well visualized. Additional Comments: No subcostal images.  LEFT VENTRICLE PLAX 2D LVIDd:         7.20 cm LVIDs:         6.10 cm LV PW:         1.40 cm LV IVS:        0.80 cm LVOT diam:     2.30 cm LVOT Area:     4.15 cm  RIGHT VENTRICLE RV S prime:     13.40 cm/s LEFT ATRIUM            Index       RIGHT ATRIUM           Index LA Vol (A4C): 118.0 ml 47.73 ml/m RA Area:     26.60 cm                                    RA Volume:   101.00 ml 40.85 ml/m                        PULMONIC VALVE AORTA                 PV Vmax:       0.86 m/s Ao Root diam: 3.70 cm PV Vmean:  56.900 cm/s                       PV VTI:        0.126 m                       PV Peak grad:  3.0 mmHg                       PV Mean grad:  1.0 mmHg  TRICUSPID VALVE TR Peak grad:   36.7 mmHg TR Vmax:        303.00 cm/s  SHUNTS Systemic Diam: 2.30 cm Jenkins Rouge MD Electronically signed by Jenkins Rouge MD Signature Date/Time: 08/01/2020/4:10:01 PM    Final     Cardiac Studies   Echo 08/01/2020 Echo 08/01/2020 1. No subcostal images.  2. Left ventricular ejection fraction, by estimation, is 20 to 25%. The  left ventricle has severely decreased function. The left ventricle  demonstrates global hypokinesis. The left ventricular internal cavity size  was severely dilated. Left ventricular  diastolic parameters are indeterminate.  3. Right ventricular  systolic function is normal. The right ventricular  size is normal. There is mildly elevated pulmonary artery systolic  pressure.  4. The pericardial effusion is posterior to the left ventricle.  5. The mitral valve is normal in structure. Mild mitral valve  regurgitation. No evidence of mitral stenosis.  6. The aortic valve is tricuspid. Aortic valve regurgitation is not  visualized. Mild aortic valve sclerosis is present, with no evidence of  aortic valve stenosis.  7. The inferior vena cava is normal in size with greater than 50%  respiratory variability, suggesting right atrial pressure of 3 mmHg.   Patient Profile     65 y.o. male with a hx of hypertension, hyperlipidemia, diabetes mellitus and bipolar disorder who is being seen for the evaluation of low EF at the request of Dr. Dwyane Dee.  Assessment & Plan   1. Chronic systolic heart failure -Echocardiogram  showed LV function of 20 to 25%, global LV hypokinesis, normal RV function, pericardial effusion is posterior to the left ventricle.  --Peviously seen by Dr. Gwenlyn Found and recommended echo and stress test given cardiac risk factor>> but never completed for some reason.  Now echocardiogram with low LV function as well as patient describing exertional chest tightness and pressure which concerning for angina. -Patient already ate his breakfast.  Will review timing of ischemic evaluation with MD. -Increase carvedilol to 6.25 mg twice daily -Faint bibasilar Rales on exam but no significant volume overload -Will plan to add Entresto and spironolactone, renal function normalized>> waiting review of ischemic evaluation with MD.  Coronary CTA would be difficult given intermittent tachycardia. -Patient does endorse eating high sodium diet at home  2.  Hypertension -Blood pressure minimally elevated at this morning -Increase carvedilol to 6.25 mg twice daily  3.  AKI -Resolved  4.  Word finding difficulty -Pending MRI of brain   For  questions or updates, please contact [facility] Please consult [email or web address] for contact info under        SignedLeanor Kail, PA  08/03/2020, 11:18 AM

## 2020-08-03 NOTE — Progress Notes (Signed)
Vaughan Basta, pts sister, updated on pts condition. All queries/concerns answered.

## 2020-08-03 NOTE — Progress Notes (Signed)
Spoke with attending Dr. Dwyane Dee regarding plan start IV heparin per Dr. Radford Pax.  Patient has pending MRI of brain.  Needs to rule out stroke before starting IV heparin.  Attending to arrange MRI versus CT prior to starting heparin.

## 2020-08-03 NOTE — Progress Notes (Signed)
[facility] for IV heparin Indication: chest pain/ACS  Allergies  Allergen Reactions  . Influenza Vaccines Swelling    Reports of swelling of the arm and then sick for 10 days    Patient Measurements: Height: 6\' 1"  (185.4 cm) Weight: 125.8 kg (277 lb 5.4 oz) IBW/kg (Calculated) : 79.9 Heparin Dosing Weight: 117 kg  Vital Signs: Temp: 98.4 F (36.9 C) (05/06 0908) Temp Source: Oral (05/06 0908) BP: 125/80 (05/06 1619) Pulse Rate: 88 (05/06 1619)  Labs: Recent Labs    08/01/20 0344 08/02/20 0358 08/03/20 0327  HGB 11.7* 10.8* 11.6*  HCT 38.3* 36.4* 39.4  PLT 186 173 162  CREATININE 1.08 1.38* 1.19    Estimated Creatinine Clearance: 87.2 mL/min (by C-G formula based on SCr of 1.19 mg/dL).   Medical History: Past Medical History:  Diagnosis Date  . Bipolar II disorder - Managed by Marye Round [phone]) 05/03/2013  . Diabetes mellitus   . Dysphagia   . Hyperlipidemia   . Hypertension   . Unspecified hypothyroidism 05/03/2013    Medications:  Medications Prior to Admission  Medication Sig Dispense Refill Last Dose  . amLODipine (NORVASC) 10 MG tablet TAKE 1 TABLET BY MOUTH EVERY DAY (Patient taking differently: Take 10 mg by mouth daily.) 90 tablet 2 07/30/2020 at Unknown time  . Blood Pressure Monitoring (BLOOD PRESSURE CUFF) MISC Use as directed (Patient taking differently: 1 each by Other route as directed.) 1 each 0 Past Week at Unknown time  . gabapentin (NEURONTIN) 300 MG capsule TAKE 1 CAPSULE BY MOUTH THREE TIMES A DAY (Patient taking differently: Take 300 mg by mouth 3 (three) times daily.) 270 capsule 0 07/30/2020 at Unknown time  . hydrochlorothiazide (HYDRODIURIL) 25 MG tablet TAKE 1/2 TABLET BY MOUTH EVERY DAY (Patient taking differently: Take 25 mg by mouth daily.) 45 tablet 1 07/30/2020 at Unknown time  . Insulin Syringe-Needle U-100 (INSULIN SYRINGE 1CC/31GX5/16") 31G X 5/16" 1 ML MISC Use 3 needles per day  (Patient taking differently: 1 each by Other route in the morning, at noon, and at bedtime.) 100 each 3 Past Week at Unknown time  . lithium carbonate 300 MG capsule Take 3 capsules (900 mg total) by mouth daily.   07/30/2020 at Unknown time  . losartan (COZAAR) 100 MG tablet TAKE 1 TABLET BY MOUTH EVERY DAY (Patient taking differently: Take 100 mg by mouth daily.) 90 tablet 0 07/30/2020 at Unknown time  . lovastatin (MEVACOR) 20 MG tablet TAKE 1 TABLET BY MOUTH EVERY DAY (Patient taking differently: Take 20 mg by mouth daily at 6 PM.) 90 tablet 0 07/30/2020 at Unknown time  . metFORMIN (GLUCOPHAGE) 1000 MG tablet TAKE 1 TABLET BY MOUTH 2 TIMES DAILY WITH A MEAL. (Patient taking differently: Take 1,000 mg by mouth 2 (two) times daily with a meal.) 180 tablet 0 07/30/2020 at Unknown time  . ONETOUCH VERIO test strip USE AS INSTRUCTED TO CHECK BLOOD SUGAR THREE TIMES A DAY. (Patient taking differently: 1 each by Other route in the morning, at noon, and at bedtime.) 300 each 1 Past Week at Unknown time  . pantoprazole (PROTONIX) 20 MG tablet TAKE 1 TABLET BY MOUTH TWICE A DAY BEFORE MEALS (Patient taking differently: Take 20 mg by mouth 2 (two) times daily before a meal.) 60 tablet 1 07/30/2020 at Unknown time  . QUEtiapine (SEROQUEL) 200 MG tablet Take 200-300 mg by mouth at bedtime.   unk  . sildenafil (VIAGRA) 50 MG tablet Take 1 tablet (50 mg  total) by mouth daily as needed for erectile dysfunction. Do not take more then once in 24 hours. 10 tablet 3 unk   Scheduled:  .  stroke: mapping our early stages of recovery book   Does not apply Once  . aspirin  300 mg Rectal Daily  . atorvastatin  20 mg Oral Daily  . carvedilol  6.25 mg Oral BID WC  . gatifloxacin  1 drop Both Eyes QID  . guaiFENesin  600 mg Oral BID  . heparin  2,000 Units Intravenous Once  . insulin aspart  0-15 Units Subcutaneous TID WC  . insulin aspart  0-5 Units Subcutaneous QHS  . insulin detemir  10 Units Subcutaneous BID  . lithium  carbonate  900 mg Oral Daily  . pantoprazole  40 mg Oral BID AC  . QUEtiapine  400 mg Oral QHS  . sodium chloride flush  3 mL Intravenous Q12H  . thiamine  100 mg Oral Daily   Assessment: 35 yoM with PMH DM2, HLD, HTN, hypothyroid, bipolar II, admitted 5/2 for possible stroke. CT head on admission negative for bleed. Patient required MRI with sedation which was not available until 5/6. Today Cardiology concerned for ACS and Pharmacy consulted for IV heparin. Did complete MRI prior to starting heparin, which showed no new bleeding or infarct.   Baseline INR, aPTT: not done  Prior anticoagulation: heparin SQ 5000 units TID, last dose 5/6 at 0600  Significant events:  Today, 08/03/2020:  CBC: low but stable; Plt stable NWL  No bleeding or infusion issues per nursing  SCr slightly elevated but stable this admission  Goal of Therapy: Heparin level 0.3-0.7 units/ml Monitor platelets by anticoagulation protocol: Yes  Plan:  Heparin 2000 units IV bolus x 1  Heparin 1500 units/hr IV infusion  Check heparin level 8 hrs after start  Daily CBC, daily heparin level once stable  Monitor for signs of bleeding or thrombosis  Reuel Boom, PharmD, BCPS [phone] 08/03/2020, 6:57 PM

## 2020-08-03 NOTE — Plan of Care (Signed)

## 2020-08-03 NOTE — Evaluation (Signed)
SLP Cancellation Note  Patient Details Name: Matthew Barnes MRN: [id number] DOB: 1956-01-25   Cancelled treatment:       Reason Eval/Treat Not Completed: Other (comment) (pt at MRI at this time, will continue efforts) Kathleen Lime, MS [place] Office [phone] Pager [phone]    [name] 08/03/2020, 5:22 PM

## 2020-08-03 NOTE — Progress Notes (Signed)
  Echocardiogram 2D Echocardiogram with contrast has been performed.  Merrie Roof F 08/03/2020, 3:56 PM

## 2020-08-03 NOTE — Progress Notes (Signed)
Pt been behaved all night, able to have some sleep, with periods of waking up to urinate. He is redirectable, restless at times  but follows commands. Still having difficulty completing a sentence, disoriented to time and situation, unable to recall the events. Will continue to monitor

## 2020-08-03 NOTE — Progress Notes (Signed)
PROGRESS NOTE    Matthew Barnes  [id number] DOB: [birth year] DOA: 07/30/2020 PCP: System, Provider Not In   Brief Narrative: This 65 years old male with PMH significant for bipolar disorder II , diabetes mellitus, hyperlipidemia, hypertension, hypothyroidism presented in the ED with difficulty finding words and completing sentences.  History is limited due to patient's inability to complete sentence.  Patient has significant history of depression and bipolar and being managed by psychiatrist Dr. Quillian Quince.  Patient was slightly aphasic but there is no focal neurological deficits noted on exam. In the ED ethanol level is less than 10,  CT head without no acute intracranial abnormality,  urine drug screen is negative.  MRI, echo and carotid duplex is ordered.  CTA chest : No evidence of PE, seems not conclusive but showed mild infiltrate. Patient has been agitated and restless,  not cooperative to have an MRI. Neurology is consulted, recommended complete stroke work-up.  MRI was attempted with sedation twice but patient was restless and agitated.  Neurology recommended transfer to [facility] for MRI under anesthesia and EEG if positive may require long-term EEG.  Patient is having delirium.  Psychiatry is consulted. Echocardiogram shows EF 20 to 25%.  No prior echocardiogram available.  Cardiology consulted recommended ischemic evaluation.  Patient is more alert and awake,  now cooperative, MRI is pending. Cardiology has started IV heparin,  planning to have left heart cath on Monday.  Assessment & Plan:   Principal Problem:   Aphasia Active Problems:   Hypertension associated with diabetes ([place])   Hyperlipidemia   Type 2 diabetes mellitus with diabetic neuropathy, with long-term current use of insulin ([facility])   Esophageal reflux   Bipolar affective, mixed ([place])   AMS (altered mental status)   Aphasia/ Word finding difficulty:  Patient presented with difficulty with finding words and completing  sentences. Exact onset of symptoms unknown.  Patient does have history of bipolar disorder II.  Differential diagnoses include speech disorder due to underlying psychiatric disorder or CVA. CT head negative for any acute abnormalities. Continue aspirin and statin. Obtain MRI, echocardiogram, carotid duplex. Neurology consulted, recommended complete stroke workup. Rule out reversible causes of memory impairment, TSH, B12, Folate, RPR MRI was attempted twice with sedation and Haldol. Patient has been restless and agitated, MRI is pending. Neurology recommended transfer to [facility] for MRI under anesthesia. Patient may require long-term EEG which is possible it [employer]. Neurochecks every 4 to 6 hours. Carotid duplex: No significant stenosis noted. Echocardiogram shows EF 20 to 25%, no prior echo available.  Cardiology is consulted. EEG no evidence of seizures. Patient is more alert and awake, cooperative, MRI is pending.  Acute on chronic systolic CHF: Patient has new LV dysfunction of unknown etiology. LVEF: 20 to 25%.  No prior echo available. Also has slightly elevated troponins 32> 33> 34. Patient denies any chest pain but reports classical exertional angina. Cardiology recommended ischemic work-up, needs left heart cath. Started on IV heparin until after cath.  Continue aspirin Lipitor, Coreg  Hypertension:  Home blood pressure medications are on hold to allow permissive hypertension. Blood pressure slightly up today.  Coreg increased to 6.125 twice daily  Hyperlipidemia  : Continue Lipitor.  Diabetes mellitus type 2 :  Continue Levemir 10 units bid and sliding scale.  Sinus tachycardia:  CTA chest was completed, shows artifacts. Not conclusive. Started metoprolol 5 mg Iv q8hr prn. CT chest shows mild infiltrate. Patient was started on ceftriaxone and Zithromax. Procalcitonin normal.  Consider  discontinuing antibiotic if no leukocytosis.  Bipolar disorder : Continue  Seroquel. Psychiatry consulted, states patient is having delirium. Advised to dc Haldol, started Seroquel 25 mg daily. Continue one-to-one observation. Resume Lithium     DVT prophylaxis: Heparin Code Status: Full code. Family Communication:No family at bed side. Disposition Plan:   Status is: Inpatient  Remains inpatient appropriate because:Inpatient level of care appropriate due to severity of illness   Dispo: The patient is from: Home              Anticipated d/c is to: Home              Patient currently is not medically stable to d/c.   Difficult to place patient No   Consultants:     Neurology , Psychiatry  Procedures: CT head, echo, MRI. Antimicrobials: Anti-infectives (From admission, onward)   Start     Dose/Rate Route Frequency Ordered Stop   08/02/20 0630  azithromycin (ZITHROMAX) tablet 500 mg  Status:  Discontinued        500 mg Oral Daily 08/02/20 0539 08/03/20 1500   07/31/20 0400  cefTRIAXone (ROCEPHIN) 1 g in sodium chloride 0.9 % 100 mL IVPB        1 g 200 mL/hr over 30 Minutes Intravenous  Once 07/31/20 0355 07/31/20 0545   07/31/20 0400  azithromycin (ZITHROMAX) 500 mg in sodium chloride 0.9 % 250 mL IVPB  Status:  Discontinued        500 mg 250 mL/hr over 60 Minutes Intravenous Every 24 hours 07/31/20 0355 08/02/20 0539      Subjective: Patient was seen and examined at bedside.  Overnight events noted. Patient is alert and oriented, very cooperative sitting in the chair involved in full conversation. He still has some slurring, denies any chest pain and dizziness.  Awaiting MRI.  Objective: Vitals:   08/02/20 2041 08/03/20 0441 08/03/20 0908 08/03/20 1242  BP: (!) 130/92 133/81 (!) 148/88 (!) 154/92  Pulse: 99 97 (!) 101 96  Resp: 20 18 20    Temp: 97.7 F (36.5 C) 97.9 F (36.6 C) 98.4 F (36.9 C)   TempSrc: Oral Oral Oral   SpO2: 98% 97% 95%   Weight:      Height:        Intake/Output Summary (Last 24 hours) at 08/03/2020 1502 Last  data filed at 08/03/2020 1246 Gross per 24 hour  Intake 205 ml  Output 300 ml  Net -95 ml   Filed Weights   07/30/20 2029 07/31/20 1522  Weight: 132.2 kg 125.8 kg    Examination:  General exam: Appears calm, comfortable , oriented x 3.  Not in any acute distress,  very cooperative. Respiratory system: Clear to auscultation. Respiratory effort normal. Cardiovascular system: S1 & S2 heard, RRR. No JVD, murmurs, rubs, gallops or clicks. No pedal edema. Gastrointestinal system: Abdomen is nondistended, soft and nontender. No organomegaly or masses felt.   Normal bowel sounds heard. Central nervous system: Alert and oriented. No focal neurological deficits. Extremities: Moves All 4 extremities.  No focal neurological deficits. Skin: No rashes, lesions or ulcers Psychiatry: Insight and judgment normal.    Data Reviewed: I have personally reviewed following labs and imaging studies  CBC: Recent Labs  Lab 07/30/20 2101 07/31/20 0643 08/01/20 0344 08/02/20 0358 08/03/20 0327  WBC 8.4 8.8 7.3 5.5 5.2  NEUTROABS 5.5  --   --   --   --   HGB 12.2* 12.5* 11.7* 10.8* 11.6*  HCT 40.3 40.7 38.3*  36.4* 39.4  MCV 83.1 82.7 84.0 83.5 86.4  PLT 203 224 186 173 0000000   Basic Metabolic Panel: Recent Labs  Lab 07/30/20 2101 07/30/20 2120 07/31/20 0643 08/01/20 0344 08/02/20 0358 08/03/20 0327  NA 138  --  143 143 143 142  K 3.8  --  3.9 3.8 3.7 4.0  CL 106  --  107 108 109 107  CO2 23  --  24 24 24 25   GLUCOSE 207*  --  241* 114* 136* 116*  BUN 14  --  16 14 18 15   CREATININE 1.24  --  1.18 1.08 1.38* 1.19  CALCIUM 9.1  --  9.6 9.1 8.8* 8.7*  MG  --  1.9 2.1 2.0 2.0 2.0  PHOS  --  3.5 3.5 4.0 4.7* 3.9   GFR: Estimated Creatinine Clearance: 87.2 mL/min (by C-G formula based on SCr of 1.19 mg/dL). Liver Function Tests: Recent Labs  Lab 07/30/20 2101 07/31/20 0643 08/01/20 0344 08/03/20 0327  AST 33 35 38 33  ALT 30 33 32 35  ALKPHOS 49 50 40 39  BILITOT 1.2 1.0 1.0 0.5   PROT 7.3 7.2 6.5 6.0*  ALBUMIN 4.0 4.1 3.6 3.3*   No results for input(s): LIPASE, AMYLASE in the last 168 hours. Recent Labs  Lab 07/31/20 0740  AMMONIA 29   Coagulation Profile: No results for input(s): INR, PROTIME in the last 168 hours. Cardiac Enzymes: Recent Labs  Lab 07/31/20 0127 07/31/20 0643  CKTOTAL 404* 417*   BNP (last 3 results) No results for input(s): PROBNP in the last 8760 hours. HbA1C: No results for input(s): HGBA1C in the last 72 hours. CBG: Recent Labs  Lab 08/02/20 1248 08/02/20 1657 08/02/20 2045 08/03/20 0746 08/03/20 1244  GLUCAP 188* 180* 150* 102* 187*   Lipid Profile: No results for input(s): CHOL, HDL, LDLCALC, TRIG, CHOLHDL, LDLDIRECT in the last 72 hours. Thyroid Function Tests: No results for input(s): TSH, T4TOTAL, FREET4, T3FREE, THYROIDAB in the last 72 hours. Anemia Panel: No results for input(s): VITAMINB12, FOLATE, FERRITIN, TIBC, IRON, RETICCTPCT in the last 72 hours. Sepsis Labs: Recent Labs  Lab 07/31/20 0127 07/31/20 0409 07/31/20 1702  PROCALCITON  --   --  <0.10  LATICACIDVEN 1.4 1.4  --     Recent Results (from the past 240 hour(s))  Culture, Urine     Status: None   Collection Time: 07/30/20  9:00 PM   Specimen: Urine, Random  Result Value Ref Range Status   Specimen Description   Final    URINE, RANDOM Performed at [facility] [address] [place], [place] [address]    Special Requests   Final    NONE Performed at [facility], [place] [address] [place], [place] [address]    Culture   Final    NO GROWTH Performed at [facility], [place] [address] [place],  [address]    Report Status 08/01/2020 FINAL  Final  Resp Panel by RT-PCR (Flu A&B, Covid) Nasopharyngeal Swab     Status: None   Collection Time: 07/30/20 11:15 PM   Specimen: Nasopharyngeal Swab; Nasopharyngeal(NP) swabs in vial transport medium  Result Value Ref Range Status   SARS  Coronavirus 2 by RT PCR NEGATIVE NEGATIVE Final    Comment: (NOTE) SARS-CoV-2 target nucleic acids are NOT DETECTED.  The SARS-CoV-2 RNA is generally detectable in upper respiratory specimens during the acute phase of infection. The lowest concentration of SARS-CoV-2 viral copies this assay can  detect is 138 copies/mL. A negative result does not preclude SARS-Cov-2 infection and should not be used as the sole basis for treatment or other patient management decisions. A negative result may occur with  improper specimen collection/handling, submission of specimen other than nasopharyngeal swab, presence of viral mutation(s) within the areas targeted by this assay, and inadequate number of viral copies(<138 copies/mL). A negative result must be combined with clinical observations, patient history, and epidemiological information. The expected result is Negative.  Fact Sheet for Patients:  EntrepreneurPulse.com.au  Fact Sheet for Healthcare Providers:  IncredibleEmployment.be  This test is no t yet approved or cleared by the [place] FDA and  has been authorized for detection and/or diagnosis of SARS-CoV-2 by FDA under an Emergency Use Authorization (EUA). This EUA will remain  in effect (meaning this test can be used) for the duration of the COVID-19 declaration under Section 564(b)(1) of the Act, 21 U.S.C.section 360bbb-3(b)(1), unless the authorization is terminated  or revoked sooner.       Influenza A by PCR NEGATIVE NEGATIVE Final   Influenza B by PCR NEGATIVE NEGATIVE Final    Comment: (NOTE) The Xpert Xpress SARS-CoV-2/FLU/RSV plus assay is intended as an aid in the diagnosis of influenza from Nasopharyngeal swab specimens and should not be used as a sole basis for treatment. Nasal washings and aspirates are unacceptable for Xpert Xpress SARS-CoV-2/FLU/RSV testing.  Fact Sheet for  Patients: EntrepreneurPulse.com.au  Fact Sheet for Healthcare Providers: IncredibleEmployment.be  This test is not yet approved or cleared by the [place] FDA and has been authorized for detection and/or diagnosis of SARS-CoV-2 by FDA under an Emergency Use Authorization (EUA). This EUA will remain in effect (meaning this test can be used) for the duration of the COVID-19 declaration under Section 564(b)(1) of the Act, 21 U.S.C. section 360bbb-3(b)(1), unless the authorization is terminated or revoked.  Performed at [facility], [place] [address], [place], [place] [address]   Culture, blood (Routine X 2) w Reflex to ID Panel     Status: None (Preliminary result)   Collection Time: 07/31/20  1:27 AM   Specimen: BLOOD  Result Value Ref Range Status   Specimen Description   Final    BLOOD LEFT ANTECUBITAL Performed at [facility] [address] [place], [place] [address]    Special Requests   Final    BOTTLES DRAWN AEROBIC AND ANAEROBIC Blood Culture adequate volume Performed at [facility] [address], [place], [place] [address]    Culture   Final    NO GROWTH 3 DAYS Performed at [facility], [place] [address] [place], [place] [address]    Report Status PENDING  Incomplete  Culture, blood (Routine X 2) w Reflex to ID Panel     Status: None (Preliminary result)   Collection Time: 07/31/20  4:09 AM   Specimen: BLOOD  Result Value Ref Range Status   Specimen Description   Final    BLOOD RIGHT ANTECUBITAL Performed at [facility] [address], [place], [place] [address]    Special Requests   Final    BOTTLES DRAWN AEROBIC AND ANAEROBIC Blood Culture results may not be optimal due to an inadequate volume of blood received in culture bottles Performed at [facility] [address] [place], [place] [address]    Culture   Final     NO GROWTH 3 DAYS Performed at [facility], [place]  [address] [place], [place] [address]    Report Status PENDING  Incomplete    Radiology Studies: EEG adult  Result Date: 08/24/20 [name], MD     08/24/2020  6:06 PM Patient Name: Matthew Barnes MRN: [id number] Epilepsy Attending: [name] Referring Physician/Provider: Dr Kerney Elbe Date: Aug 24, 2020 Duration: 25.13 mins Patient history: 65yo M with speech disturbance and ams. EEG to evaluate for seizure. Level of alertness: Awake AEDs during EEG study: None Technical aspects: This EEG study was done with scalp electrodes positioned according to the 10-20 International system of electrode placement. Electrical activity was acquired at a sampling rate of 500Hz  and reviewed with a high frequency filter of 70Hz  and a low frequency filter of 1Hz . EEG data were recorded continuously and digitally stored. Description: The posterior dominant rhythm consists of 7 Hz activity of moderate voltage (25-35 uV) seen predominantly in posterior head regions, symmetric and reactive to eye opening and eye closing. EEG showed continuous generalized 5 to 6 Hz theta slowing.  Hyperventilation and photic stimulation were not performed.   ABNORMALITY - Continuous slow, generalized - Background slow IMPRESSION: This study is suggestive of moderate diffuse encephalopathy, nonspecific etiology. No seizures or epileptiform discharges were seen throughout the recording. Priyanka Barbra Sarks    Scheduled Meds: .  stroke: mapping our early stages of recovery book   Does not apply Once  . aspirin  300 mg Rectal Daily  . atorvastatin  20 mg Oral Daily  . carvedilol  6.25 mg Oral BID WC  . gatifloxacin  1 drop Both Eyes QID  . guaiFENesin  600 mg Oral BID  . insulin aspart  0-15 Units Subcutaneous TID WC  . insulin aspart  0-5 Units Subcutaneous QHS  . insulin detemir  10 Units Subcutaneous BID  . lithium carbonate  900 mg Oral Daily  . pantoprazole  40 mg Oral  BID AC  . QUEtiapine  400 mg Oral QHS  . sodium chloride flush  3 mL Intravenous Q12H  . thiamine  100 mg Oral Daily   Continuous Infusions: . sodium chloride       LOS: 3 days    Time spent: 35 mins    Ozan Maclay, MD [facility]   If 7PM-7AM, please contact night-coverage

## 2020-08-03 NOTE — Care Management Important Message (Signed)
Important Message  Patient Details IM Letter given to the Patient. Name: Matthew Barnes MRN: [id number] Date of Birth: Aug 30, 1955   Medicare Important Message Given:  Yes     Kerin Salen 08/03/2020, 11:38 AM

## 2020-08-03 NOTE — Progress Notes (Addendum)
Shift Summary:  Remained alert to self, hospital, family.  Some periods of agitation during this shift, but mostly cooperative. No changes from previous shift. . Thorough update provided to sister Matthew Barnes during the earlier part of this shift/ Still refusing telemetry. Per MRI, they would be up to get patient at 1715. MRI pending. IV to 20 g right forearm remains saline locked. Observer remains at bedside. Echo completed during this shift. Results pending.Seen by cardiology. Possible cardiac cath planned for Monday. No other needs identified. Will continue to monitor.

## 2020-08-04 DIAGNOSIS — R4701 Aphasia: Secondary | ICD-10-CM | POA: Diagnosis not present

## 2020-08-04 LAB — CBC
HCT: 38.2 % — ABNORMAL LOW (ref 39.0–52.0)
Hemoglobin: 11.3 g/dL — ABNORMAL LOW (ref 13.0–17.0)
MCH: 25.1 pg — ABNORMAL LOW (ref 26.0–34.0)
MCHC: 29.6 g/dL — ABNORMAL LOW (ref 30.0–36.0)
MCV: 84.9 fL (ref 80.0–100.0)
Platelets: 169 10*3/uL (ref 150–400)
RBC: 4.5 MIL/uL (ref 4.22–5.81)
RDW: 14.1 % (ref 11.5–15.5)
WBC: 4.7 10*3/uL (ref 4.0–10.5)
nRBC: 0 % (ref 0.0–0.2)

## 2020-08-04 LAB — HEPARIN LEVEL (UNFRACTIONATED)
Heparin Unfractionated: <0.1 IU/mL — ABNORMAL LOW (ref 0.30–0.70)
Heparin Unfractionated: <0.1 IU/mL — ABNORMAL LOW (ref 0.30–0.70)
Heparin Unfractionated: 0.4 IU/mL (ref 0.30–0.70)

## 2020-08-04 LAB — COMPREHENSIVE METABOLIC PANEL
ALT: 30 U/L (ref 0–44)
AST: 29 U/L (ref 15–41)
Albumin: 3.4 g/dL — ABNORMAL LOW (ref 3.5–5.0)
Alkaline Phosphatase: 38 U/L (ref 38–126)
Anion gap: 9 (ref 5–15)
BUN: 20 mg/dL (ref 8–23)
CO2: 24 mmol/L (ref 22–32)
Calcium: 9.3 mg/dL (ref 8.9–10.3)
Chloride: 112 mmol/L — ABNORMAL HIGH (ref 98–111)
Creatinine, Ser: 1.29 mg/dL — ABNORMAL HIGH (ref 0.61–1.24)
GFR, Estimated: >60 mL/min (ref >60)
Glucose, Bld: 134 mg/dL — ABNORMAL HIGH (ref 70–99)
Potassium: 4.1 mmol/L (ref 3.5–5.1)
Sodium: 145 mmol/L (ref 135–145)
Total Bilirubin: 0.4 mg/dL (ref 0.3–1.2)
Total Protein: 6.4 g/dL — ABNORMAL LOW (ref 6.5–8.1)

## 2020-08-04 LAB — MAGNESIUM: Magnesium: 2.1 mg/dL (ref 1.7–2.4)

## 2020-08-04 LAB — GLUCOSE, CAPILLARY
Glucose-Capillary: 134 mg/dL — ABNORMAL HIGH (ref 70–99)
Glucose-Capillary: 185 mg/dL — ABNORMAL HIGH (ref 70–99)
Glucose-Capillary: 144 mg/dL — ABNORMAL HIGH (ref 70–99)
Glucose-Capillary: 117 mg/dL — ABNORMAL HIGH (ref 70–99)

## 2020-08-04 LAB — PHOSPHORUS: Phosphorus: 4.2 mg/dL (ref 2.5–4.6)

## 2020-08-04 MED ORDER — HEPARIN BOLUS VIA INFUSION
3500.0000 [IU] | Freq: Once | INTRAVENOUS | Status: AC
  Administered 2020-08-04: 3500 [IU] via INTRAVENOUS
  Filled 2020-08-04: qty 3500

## 2020-08-04 MED ORDER — HEPARIN BOLUS VIA INFUSION
2000.0000 [IU] | Freq: Once | INTRAVENOUS | Status: AC
  Administered 2020-08-04: 2000 [IU] via INTRAVENOUS
  Filled 2020-08-04: qty 2000

## 2020-08-04 NOTE — Plan of Care (Signed)
  Problem: Clinical Measurements: Goal: Ability to maintain clinical measurements within normal limits will improve Outcome: Progressing Goal: Will remain free from infection Outcome: Progressing Goal: Diagnostic test results will improve Outcome: Progressing Goal: Respiratory complications will improve Outcome: Progressing Goal: Cardiovascular complication will be avoided Outcome: Progressing   Problem: Activity: Goal: Risk for activity intolerance will decrease Outcome: Progressing   Problem: Nutrition: Goal: Adequate nutrition will be maintained Outcome: Progressing   Problem: Coping: Goal: Level of anxiety will decrease Outcome: Progressing   Problem: Elimination: Goal: Will not experience complications related to bowel motility Outcome: Progressing Goal: Will not experience complications related to urinary retention Outcome: Progressing   Problem: Safety: Goal: Ability to remain free from injury will improve Outcome: Progressing   Problem: Skin Integrity: Goal: Risk for impaired skin integrity will decrease Outcome: Progressing   

## 2020-08-04 NOTE — Progress Notes (Signed)
MRI/MRA of the brain completed yesterday: - No acute stroke is seen on brain MRI. There is diffuse mild/moderate cerebral atrophy.  - No evidence for significant proximal stenosis, aneurysm, or branch vessel occlusion on MRA head.   EEG on 5/4 was suggestive of moderate diffuse encephalopathy, nonspecific to etiology. No seizures or epileptiform discharges were seen throughout the recording.  A/R: 65 year old male who presents for evaluation of progressive aphasia / word finding difficulties - The patient's aphasia pattern with AMS is most consistent with an encephalopathy. Stroke ruled out by imaging.  - Encephalopathy unlikely to be due to seizure/postictal state as EEG showed no epileptiform discharges or focal slowing.  - Speech impairment due to underlying psychiatric disorder versus a delirium secondary to a toxic/metabolic/infectious etiology are now the most likely explanations for the patient's presentation - Neurology team will sign off. Please call if there are additional questions.   Electronically signed: Dr. Kerney Elbe

## 2020-08-04 NOTE — Progress Notes (Signed)
[facility] for IV heparin Indication: chest pain/ACS  Allergies  Allergen Reactions  . Influenza Vaccines Swelling    Reports of swelling of the arm and then sick for 10 days    Patient Measurements: Height: 6\' 1"  (185.4 cm) Weight: 125.8 kg (277 lb 5.4 oz) IBW/kg (Calculated) : 79.9 Heparin Dosing Weight: 117 kg  Vital Signs: Temp: 98.1 F (36.7 C) (05/07 0443) Temp Source: Oral (05/07 0443) BP: 133/87 (05/07 0443) Pulse Rate: 95 (05/07 0443)  Labs: Recent Labs    08/02/20 0358 08/03/20 0327 08/04/20 0437  HGB 10.8* 11.6* 11.3*  HCT 36.4* 39.4 38.2*  PLT 173 162 169  HEPARINUNFRC  --   --  <0.10*  CREATININE 1.38* 1.19 1.29*    Estimated Creatinine Clearance: 80.4 mL/min (A) (by C-G formula based on SCr of 1.29 mg/dL (H)).   Medical History: Past Medical History:  Diagnosis Date  . Bipolar II disorder - Managed by Marye Round [phone]) 05/03/2013  . Diabetes mellitus   . Dysphagia   . Hyperlipidemia   . Hypertension   . Unspecified hypothyroidism 05/03/2013    Medications:  Medications Prior to Admission  Medication Sig Dispense Refill Last Dose  . amLODipine (NORVASC) 10 MG tablet TAKE 1 TABLET BY MOUTH EVERY DAY (Patient taking differently: Take 10 mg by mouth daily.) 90 tablet 2 07/30/2020 at Unknown time  . Blood Pressure Monitoring (BLOOD PRESSURE CUFF) MISC Use as directed (Patient taking differently: 1 each by Other route as directed.) 1 each 0 Past Week at Unknown time  . gabapentin (NEURONTIN) 300 MG capsule TAKE 1 CAPSULE BY MOUTH THREE TIMES A DAY (Patient taking differently: Take 300 mg by mouth 3 (three) times daily.) 270 capsule 0 07/30/2020 at Unknown time  . hydrochlorothiazide (HYDRODIURIL) 25 MG tablet TAKE 1/2 TABLET BY MOUTH EVERY DAY (Patient taking differently: Take 25 mg by mouth daily.) 45 tablet 1 07/30/2020 at Unknown time  . Insulin Syringe-Needle U-100 (INSULIN SYRINGE 1CC/31GX5/16") 31G X  5/16" 1 ML MISC Use 3 needles per day (Patient taking differently: 1 each by Other route in the morning, at noon, and at bedtime.) 100 each 3 Past Week at Unknown time  . lithium carbonate 300 MG capsule Take 3 capsules (900 mg total) by mouth daily.   07/30/2020 at Unknown time  . losartan (COZAAR) 100 MG tablet TAKE 1 TABLET BY MOUTH EVERY DAY (Patient taking differently: Take 100 mg by mouth daily.) 90 tablet 0 07/30/2020 at Unknown time  . lovastatin (MEVACOR) 20 MG tablet TAKE 1 TABLET BY MOUTH EVERY DAY (Patient taking differently: Take 20 mg by mouth daily at 6 PM.) 90 tablet 0 07/30/2020 at Unknown time  . metFORMIN (GLUCOPHAGE) 1000 MG tablet TAKE 1 TABLET BY MOUTH 2 TIMES DAILY WITH A MEAL. (Patient taking differently: Take 1,000 mg by mouth 2 (two) times daily with a meal.) 180 tablet 0 07/30/2020 at Unknown time  . ONETOUCH VERIO test strip USE AS INSTRUCTED TO CHECK BLOOD SUGAR THREE TIMES A DAY. (Patient taking differently: 1 each by Other route in the morning, at noon, and at bedtime.) 300 each 1 Past Week at Unknown time  . pantoprazole (PROTONIX) 20 MG tablet TAKE 1 TABLET BY MOUTH TWICE A DAY BEFORE MEALS (Patient taking differently: Take 20 mg by mouth 2 (two) times daily before a meal.) 60 tablet 1 07/30/2020 at Unknown time  . QUEtiapine (SEROQUEL) 200 MG tablet Take 200-300 mg by mouth at bedtime.   unk  .  sildenafil (VIAGRA) 50 MG tablet Take 1 tablet (50 mg total) by mouth daily as needed for erectile dysfunction. Do not take more then once in 24 hours. 10 tablet 3 unk   Scheduled:  .  stroke: mapping our early stages of recovery book   Does not apply Once  . aspirin  300 mg Rectal Daily  . atorvastatin  20 mg Oral Daily  . carvedilol  6.25 mg Oral BID WC  . gatifloxacin  1 drop Both Eyes QID  . guaiFENesin  600 mg Oral BID  . insulin aspart  0-15 Units Subcutaneous TID WC  . insulin aspart  0-5 Units Subcutaneous QHS  . insulin detemir  10 Units Subcutaneous BID  . lithium  carbonate  900 mg Oral Daily  . pantoprazole  40 mg Oral BID AC  . QUEtiapine  400 mg Oral QHS  . sodium chloride flush  3 mL Intravenous Q12H  . thiamine  100 mg Oral Daily   Assessment: 76 yoM with PMH DM2, HLD, HTN, hypothyroid, bipolar II, admitted 5/2 for possible stroke. CT head on admission negative for bleed. Patient required MRI with sedation which was not available until 5/6. Today Cardiology concerned for ACS and Pharmacy consulted for IV heparin. Did complete MRI prior to starting heparin, which showed no new bleeding or infarct.   Baseline INR, aPTT: not done  Prior anticoagulation: heparin SQ 5000 units TID, last dose 5/6 at 0600  Significant events:  Today, 08/04/2020:  HL < 0.1 on 1500 units/hr  CBC: low but stable; Plt stable NWL  No bleeding or infusion issues per nursing  SCr slightly elevated but stable this admission  Goal of Therapy: Heparin level 0.3-0.7 units/ml Monitor platelets by anticoagulation protocol: Yes  Plan:  Heparin 2000 units IV bolus x 1  Increase heparin drip to 1850 units/hr  Check heparin level 6 hrs   Daily CBC, daily heparin level once stable  Monitor for signs of bleeding or thrombosis  Dolly Rias RPh 08/04/2020, 6:20 AM

## 2020-08-04 NOTE — Progress Notes (Signed)
PROGRESS NOTE    Matthew Barnes  ONG:[id number] DOB: 01/26/1956 DOA: 07/30/2020 PCP: System, Provider Not In   Brief Narrative: This 65 years old male with PMH significant for bipolar disorder II , diabetes mellitus, hyperlipidemia, hypertension, hypothyroidism presented in the ED with difficulty finding words and completing sentences.  History is limited due to patient's inability to complete sentence. Patient has significant history of depression and bipolar and being managed by psychiatrist Dr. Quillian Quince.  Patient was slightly aphasic but there is no focal neurological deficits noted on exam. In the ED ethanol level is less than 10,  CT head without no acute intracranial abnormality,  urine drug screen is negative.  MRI, echo and carotid duplex is ordered.  CTA chest : No evidence of PE, seems not conclusive but showed mild infiltrate. Patient was agitated and restless,  not cooperative to have an MRI. Neurology is consulted, recommended complete stroke work-up.  MRI was attempted with sedation twice but patient was restless and agitated.  Neurology recommended transfer to [facility] for MRI under anesthesia and EEG if positive may require long-term EEG.  Echocardiogram shows EF 20 to 25%.  No prior echocardiogram available.Cardiology consulted recommended ischemic evaluation.  Patient is more alert and awake,  now cooperative, MRI completed , CVA ruled out.  EEG no seizures.  Transfer has been canceled.  Cardiology has started IV heparin,  Plan is to have left heart cath on Monday.  Assessment & Plan:   Principal Problem:   Aphasia Active Problems:   Hypertension associated with diabetes ([place])   Hyperlipidemia   Type 2 diabetes mellitus with diabetic neuropathy, with long-term current use of insulin ([facility])   Esophageal reflux   Bipolar affective, mixed ([place])   AMS (altered mental status)   Aphasia/ Word finding difficulty:  Patient presented with difficulty with finding words and completing  sentences. Exact onset of symptoms unknown.  Patient does have history of bipolar disorder II.  Differential diagnoses include speech disorder due to underlying psychiatric disorder or CVA. CT head negative for any acute abnormalities. Continue aspirin and statin. Neurology consulted, recommended complete stroke workup.  MRI, echo, carotid duplex MRI was attempted twice with sedation and Haldol. Patient was restless and agitated, MRI is pending. Neurology recommended transfer to [facility] for MRI under anesthesia. Patient may require long-term EEG which is possible it [employer]. Neurochecks every 4 to 6 hours. Carotid duplex: No significant stenosis noted. Echocardiogram shows EF 20 to 25%, no prior echo available.  Cardiology is consulted. EEG:  no evidence of seizures. Patient's agitation and restlessness has resolved.  Patient cooperative,  MRI completed and ruled out stroke. Transfer has been canceled.  Neurology signed off. Patient's aphasia is  Most likely secondary to underlying psychiatric disorder.  Acute on chronic systolic CHF: Patient has new LV dysfunction of unknown etiology. Echo : LVEF: 20 to 25%.  No prior echo available. Also has slightly elevated troponins 32> 33> 34. Patient denies any chest pain but reports classical exertional angina. Cardiology recommended ischemic work-up, needs left heart cath. Started on IV heparin until after cath.  Continue aspirin Lipitor, Coreg Plan is for left heart cath on Monday.  Hypertension:  Home blood pressure medications were on hold to allow permissive hypertension. Blood pressure slightly up now.  Coreg increased to 6.125 twice daily  Hyperlipidemia  : Continue Lipitor.  Diabetes mellitus type 2 :  Continue Levemir 10 units bid and sliding scale.  Sinus tachycardia:  CTA chest was completed, shows  artifacts. Not conclusive. Started metoprolol 5 mg Iv q8hr prn. CT chest shows mild infiltrate. Patient was started on  ceftriaxone and Zithromax. Procalcitonin normal.  Antibiotic discontinued.  Bipolar disorder : Continue Seroquel. Psychiatry consulted, states patient is having delirium. Advised to dc Haldol, started Seroquel 25 mg daily. Continue one-to-one observation. Resumed Lithium     DVT prophylaxis: Heparin Code Status: Full code. Family Communication:No family at bed side. Disposition Plan:   Status is: Inpatient  Remains inpatient appropriate because:Inpatient level of care appropriate due to severity of illness   Dispo: The patient is from: Home              Anticipated d/c is to: Home in 1-2 days              Patient currently is not medically stable to d/c.   Difficult to place patient No   Consultants:     Neurology , Psychiatry, cardiology  Procedures: CT head, echo, MRI. Antimicrobials: Anti-infectives (From admission, onward)   Start     Dose/Rate Route Frequency Ordered Stop   08/02/20 0630  azithromycin (ZITHROMAX) tablet 500 mg  Status:  Discontinued        500 mg Oral Daily 08/02/20 0539 08/03/20 1500   07/31/20 0400  cefTRIAXone (ROCEPHIN) 1 g in sodium chloride 0.9 % 100 mL IVPB        1 g 200 mL/hr over 30 Minutes Intravenous  Once 07/31/20 0355 07/31/20 0545   07/31/20 0400  azithromycin (ZITHROMAX) 500 mg in sodium chloride 0.9 % 250 mL IVPB  Status:  Discontinued        500 mg 250 mL/hr over 60 Minutes Intravenous Every 24 hours 07/31/20 0355 08/02/20 0539      Subjective: Patient was seen and examined at bedside.  Overnight events noted. Patient is more alert and oriented, very cooperative sitting in the chair,  involved in full conversation. He still has some slurring, denies any chest pain and dizziness.  MRI completed, ruled out stroke.  Objective: Vitals:   08/03/20 2128 08/04/20 0443 08/04/20 1010 08/04/20 1243  BP: (!) 126/97 133/87 131/90 113/73  Pulse: 100 95 87 89  Resp: 20 18  19   Temp: 98.2 F (36.8 C) 98.1 F (36.7 C)    TempSrc: Oral  Oral    SpO2: 100% 98%  98%  Weight:      Height:        Intake/Output Summary (Last 24 hours) at 08/04/2020 1359 Last data filed at 08/04/2020 0500 Gross per 24 hour  Intake 345.5 ml  Output --  Net 345.5 ml   Filed Weights   07/30/20 2029 07/31/20 1522  Weight: 132.2 kg 125.8 kg    Examination:  General exam: Appears calm, comfortable , oriented x 3.  Not in any acute distress,  very cooperative. Respiratory system: Clear to auscultation. Respiratory effort normal. Cardiovascular system: S1 & S2 heard, RRR. No JVD, murmurs, rubs, gallops or clicks. No pedal edema. Gastrointestinal system: Abdomen is nondistended, soft and nontender. No organomegaly or masses felt.   Normal bowel sounds heard. Central nervous system: Alert and oriented x3. No focal neurological deficits. Extremities: Moves All 4 extremities.  No focal neurological deficits. Skin: No rashes, lesions or ulcers Psychiatry: Insight and judgment normal.    Data Reviewed: I have personally reviewed following labs and imaging studies  CBC: Recent Labs  Lab 07/30/20 2101 07/31/20 0643 08/01/20 0344 08/02/20 0358 08/03/20 0327 08/04/20 0437  WBC 8.4 8.8 7.3 5.5 5.2  4.7  NEUTROABS 5.5  --   --   --   --   --   HGB 12.2* 12.5* 11.7* 10.8* 11.6* 11.3*  HCT 40.3 40.7 38.3* 36.4* 39.4 38.2*  MCV 83.1 82.7 84.0 83.5 86.4 84.9  PLT 203 224 186 173 162 [id number]   Basic Metabolic Panel: Recent Labs  Lab 07/31/20 0643 08/01/20 0344 08/02/20 0358 08/03/20 0327 08/04/20 0437  NA 143 143 143 142 145  K 3.9 3.8 3.7 4.0 4.1  CL 107 108 109 107 112*  CO2 24 24 24 25 24   GLUCOSE 241* 114* 136* 116* 134*  BUN 16 14 18 15 20   CREATININE 1.18 1.08 1.38* 1.19 1.29*  CALCIUM 9.6 9.1 8.8* 8.7* 9.3  MG 2.1 2.0 2.0 2.0 2.1  PHOS 3.5 4.0 4.7* 3.9 4.2   GFR: Estimated Creatinine Clearance: 80.4 mL/min (A) (by C-G formula based on SCr of 1.29 mg/dL (H)). Liver Function Tests: Recent Labs  Lab 07/30/20 2101 07/31/20 0643  08/01/20 0344 08/03/20 0327 08/04/20 0437  AST 33 35 38 33 29  ALT 30 33 32 35 30  ALKPHOS 49 50 40 39 38  BILITOT 1.2 1.0 1.0 0.5 0.4  PROT 7.3 7.2 6.5 6.0* 6.4*  ALBUMIN 4.0 4.1 3.6 3.3* 3.4*   No results for input(s): LIPASE, AMYLASE in the last 168 hours. Recent Labs  Lab 07/31/20 0740  AMMONIA 29   Coagulation Profile: No results for input(s): INR, PROTIME in the last 168 hours. Cardiac Enzymes: Recent Labs  Lab 07/31/20 0127 07/31/20 0643  CKTOTAL 404* 417*   BNP (last 3 results) No results for input(s): PROBNP in the last 8760 hours. HbA1C: No results for input(s): HGBA1C in the last 72 hours. CBG: Recent Labs  Lab 08/03/20 1244 08/03/20 1615 08/03/20 2128 08/04/20 0730 08/04/20 1127  GLUCAP 187* 120* 171* 134* 185*   Lipid Profile: No results for input(s): CHOL, HDL, LDLCALC, TRIG, CHOLHDL, LDLDIRECT in the last 72 hours. Thyroid Function Tests: No results for input(s): TSH, T4TOTAL, FREET4, T3FREE, THYROIDAB in the last 72 hours. Anemia Panel: No results for input(s): VITAMINB12, FOLATE, FERRITIN, TIBC, IRON, RETICCTPCT in the last 72 hours. Sepsis Labs: Recent Labs  Lab 07/31/20 0127 07/31/20 0409 07/31/20 1702  PROCALCITON  --   --  <0.10  LATICACIDVEN 1.4 1.4  --     Recent Results (from the past 240 hour(s))  Culture, Urine     Status: None   Collection Time: 07/30/20  9:00 PM   Specimen: Urine, Random  Result Value Ref Range Status   Specimen Description   Final    URINE, RANDOM Performed at [facility] [address], [place], [place] [address]    Special Requests   Final    NONE Performed at [facility], [place] [address], [place], [place] [address]    Culture   Final    NO GROWTH Performed at [facility], [place] [address], [place], [place] [address]    Report Status 08/01/2020 FINAL  Final  Resp Panel by RT-PCR (Flu A&B, Covid) Nasopharyngeal Swab     Status: None   Collection Time:  07/30/20 11:15 PM   Specimen: Nasopharyngeal Swab; Nasopharyngeal(NP) swabs in vial transport medium  Result Value Ref Range Status   SARS Coronavirus 2 by RT PCR NEGATIVE NEGATIVE Final    Comment: (NOTE) SARS-CoV-2 target nucleic acids are NOT DETECTED.  The SARS-CoV-2 RNA is generally detectable in upper respiratory specimens during the acute  phase of infection. The lowest concentration of SARS-CoV-2 viral copies this assay can detect is 138 copies/mL. A negative result does not preclude SARS-Cov-2 infection and should not be used as the sole basis for treatment or other patient management decisions. A negative result may occur with  improper specimen collection/handling, submission of specimen other than nasopharyngeal swab, presence of viral mutation(s) within the areas targeted by this assay, and inadequate number of viral copies(<138 copies/mL). A negative result must be combined with clinical observations, patient history, and epidemiological information. The expected result is Negative.  Fact Sheet for Patients:  EntrepreneurPulse.com.au  Fact Sheet for Healthcare Providers:  IncredibleEmployment.be  This test is no t yet approved or cleared by the [place] FDA and  has been authorized for detection and/or diagnosis of SARS-CoV-2 by FDA under an Emergency Use Authorization (EUA). This EUA will remain  in effect (meaning this test can be used) for the duration of the COVID-19 declaration under Section 564(b)(1) of the Act, 21 U.S.C.section 360bbb-3(b)(1), unless the authorization is terminated  or revoked sooner.       Influenza A by PCR NEGATIVE NEGATIVE Final   Influenza B by PCR NEGATIVE NEGATIVE Final    Comment: (NOTE) The Xpert Xpress SARS-CoV-2/FLU/RSV plus assay is intended as an aid in the diagnosis of influenza from Nasopharyngeal swab specimens and should not be used as a sole basis for treatment. Nasal washings  and aspirates are unacceptable for Xpert Xpress SARS-CoV-2/FLU/RSV testing.  Fact Sheet for Patients: EntrepreneurPulse.com.au  Fact Sheet for Healthcare Providers: IncredibleEmployment.be  This test is not yet approved or cleared by the [place] FDA and has been authorized for detection and/or diagnosis of SARS-CoV-2 by FDA under an Emergency Use Authorization (EUA). This EUA will remain in effect (meaning this test can be used) for the duration of the COVID-19 declaration under Section 564(b)(1) of the Act, 21 U.S.C. section 360bbb-3(b)(1), unless the authorization is terminated or revoked.  Performed at [facility], [place] [address] [place], [place] [address]   Culture, blood (Routine X 2) w Reflex to ID Panel     Status: None (Preliminary result)   Collection Time: 07/31/20  1:27 AM   Specimen: BLOOD  Result Value Ref Range Status   Specimen Description   Final    BLOOD LEFT ANTECUBITAL Performed at [facility] [address] [place], [place] [address]    Special Requests   Final    BOTTLES DRAWN AEROBIC AND ANAEROBIC Blood Culture adequate volume Performed at [facility] [address] [place], [place] [address]    Culture   Final    NO GROWTH 4 DAYS Performed at [facility], [place] [address], [place], [place] [address]    Report Status PENDING  Incomplete  Culture, blood (Routine X 2) w Reflex to ID Panel     Status: None (Preliminary result)   Collection Time: 07/31/20  4:09 AM   Specimen: BLOOD  Result Value Ref Range Status   Specimen Description   Final    BLOOD RIGHT ANTECUBITAL Performed at [facility] [address] [place], [place] [address]    Special Requests   Final    BOTTLES DRAWN AEROBIC AND ANAEROBIC Blood Culture results may not be optimal due to an inadequate volume of blood received in culture bottles Performed at  [facility] [address] [place], [place] [address]    Culture   Final  NO GROWTH 4 DAYS Performed at [facility], [place] [address] [place], [place] [address]    Report Status PENDING  Incomplete    Radiology Studies: MR ANGIO HEAD WO CONTRAST  Result Date: 08/03/2020 CLINICAL DATA:  Abnormal speech. Patient was unable to tolerate additional imaging including coronal images or postcontrast images. EXAM: MRI HEAD WITHOUT CONTRAST MRA HEAD WITHOUT CONTRAST TECHNIQUE: Multiplanar, multiecho pulse sequences of the brain and surrounding structures were obtained without intravenous contrast. Angiographic images of the head were obtained using MRA technique without contrast. COMPARISON:  CT head without contrast 07/30/2020. MR head without and with contrast 07/13/2019 FINDINGS: MRI HEAD FINDINGS Brain: Axial diffusion-weighted images demonstrate no acute or subacute infarction. Patient did not tolerate coronal diffusion images. Mild atrophy and white matter changes are stable and within normal limits for age. The ventricles are of normal size. No significant extraaxial fluid collection is present. The internal auditory canals are within normal limits. The brainstem and cerebellum are within normal limits. Vascular: Flow is present in the major intracranial arteries. Skull and upper cervical spine: The craniocervical junction is normal. 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 Marrow signal is somewhat depressed diffusely. No discrete lesions present. Sinuses/Orbits: Mild mucosal thickening is present in the right maxillary sinus and scattered throughout the right ethmoid air cells. No fluid levels are present. Left sphenoid sinus mucosal thickening noted. There is some fluid in the right mastoid air cells. No obstructing nasopharyngeal lesion is present. The globes and orbits are within normal limits. MRA HEAD FINDINGS Mild tortuosity is present high cervical segments without significant  stenosis. There is no stenosis through the ICA termini. A1 and M1 segments are within normal limits. No definite anterior communicating artery is present. MCA bifurcations are intact. ACA and MCA branch vessels are within normal limits. There is some attenuation due to artifact. Vertebrobasilar junction visualized. Lower vertebral arteries and PICA origins not seen. Left AICA is dominant. Basilar artery is normal. Both posterior cerebral arteries originate from basilar tip. PCA branch vessels are within normal limits. IMPRESSION: 1. Normal MRI appearance of the brain for age. 2. Mild sinus disease and right mastoid effusion. No obstructing nasopharyngeal lesion is present. 3. Normal MRA Circle of Willis without evidence for significant proximal stenosis, aneurysm, or branch vessel occlusion. Electronically Signed   By: San Morelle M.D.   On: 08/03/2020 18:04   MR BRAIN WO CONTRAST  Result Date: 08/03/2020 CLINICAL DATA:  Abnormal speech. Patient was unable to tolerate additional imaging including coronal images or postcontrast images. EXAM: MRI HEAD WITHOUT CONTRAST MRA HEAD WITHOUT CONTRAST TECHNIQUE: Multiplanar, multiecho pulse sequences of the brain and surrounding structures were obtained without intravenous contrast. Angiographic images of the head were obtained using MRA technique without contrast. COMPARISON:  CT head without contrast 07/30/2020. MR head without and with contrast 07/13/2019 FINDINGS: MRI HEAD FINDINGS Brain: Axial diffusion-weighted images demonstrate no acute or subacute infarction. Patient did not tolerate coronal diffusion images. Mild atrophy and white matter changes are stable and within normal limits for age. The ventricles are of normal size. No significant extraaxial fluid collection is present. The internal auditory canals are within normal limits. The brainstem and cerebellum are within normal limits. Vascular: Flow is present in the major intracranial arteries. Skull  and upper cervical spine: The craniocervical junction is normal. 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 Marrow signal is somewhat depressed diffusely. No discrete lesions present. Sinuses/Orbits: Mild mucosal thickening is present in the right maxillary sinus and scattered throughout the right  ethmoid air cells. No fluid levels are present. Left sphenoid sinus mucosal thickening noted. There is some fluid in the right mastoid air cells. No obstructing nasopharyngeal lesion is present. The globes and orbits are within normal limits. MRA HEAD FINDINGS Mild tortuosity is present high cervical segments without significant stenosis. There is no stenosis through the ICA termini. A1 and M1 segments are within normal limits. No definite anterior communicating artery is present. MCA bifurcations are intact. ACA and MCA branch vessels are within normal limits. There is some attenuation due to artifact. Vertebrobasilar junction visualized. Lower vertebral arteries and PICA origins not seen. Left AICA is dominant. Basilar artery is normal. Both posterior cerebral arteries originate from basilar tip. PCA branch vessels are within normal limits. IMPRESSION: 1. Normal MRI appearance of the brain for age. 2. Mild sinus disease and right mastoid effusion. No obstructing nasopharyngeal lesion is present. 3. Normal MRA Circle of Willis without evidence for significant proximal stenosis, aneurysm, or branch vessel occlusion. Electronically Signed   By: San Morelle M.D.   On: 08/03/2020 18:04   ECHOCARDIOGRAM LIMITED  Result Date: 08/03/2020    ECHOCARDIOGRAM LIMITED REPORT   Patient Name:   Matthew Barnes Date of Exam: 08/03/2020 Medical Rec #:  [id number]    Height:       73.0 in Accession #:    [id number]   Weight:       277.3 lb Date of Birth:  [birth year]   BSA:          2.472 m Patient Age:    64 years     BP:           154/92 mmHg Patient Gender: M            HR:           96 bpm. Exam Location:  Inpatient Procedure: Limited Echo  and Intracardiac Opacification Agent Indications:    Rule out thrombus/ Stroke  History:        Patient has prior history of Echocardiogram examinations, most                 recent 08/01/2020.  Sonographer:    Merrie Roof Referring Phys: [place] Comments: This was a limited echo to rule out thrombus. IMPRESSIONS  1. Limited contrast echo for LV thrombus evaluation. No evidence of LV thrombus. Left ventricular ejection fraction, by estimation, is 20 to 25%. The left ventricle has severely decreased function. The left ventricle demonstrates global hypokinesis. Conclusion(s)/Recommendation(s): No left ventricular mural or apical thrombus/thrombi. FINDINGS  Left Ventricle: Limited contrast echo for LV thrombus evaluation. No evidence of LV thrombus. Left ventricular ejection fraction, by estimation, is 20 to 25%. The left ventricle has severely decreased function. The left ventricle demonstrates global hypokinesis. Definity contrast agent was given IV to delineate the left ventricular endocardial borders. Eleonore Chiquito MD Electronically signed by Eleonore Chiquito MD Signature Date/Time: 08/03/2020/5:16:04 PM    Final     Scheduled Meds: .  stroke: mapping our early stages of recovery book   Does not apply Once  . aspirin  300 mg Rectal Daily  . atorvastatin  20 mg Oral Daily  . carvedilol  6.25 mg Oral BID WC  . gatifloxacin  1 drop Both Eyes QID  . guaiFENesin  600 mg Oral BID  . insulin aspart  0-15 Units Subcutaneous TID WC  . insulin aspart  0-5 Units Subcutaneous QHS  . insulin detemir  10 Units Subcutaneous  BID  . lithium carbonate  900 mg Oral Daily  . pantoprazole  40 mg Oral BID AC  . QUEtiapine  400 mg Oral QHS  . sodium chloride flush  3 mL Intravenous Q12H  . thiamine  100 mg Oral Daily   Continuous Infusions: . sodium chloride    . heparin 1,850 Units/hr (08/04/20 1012)     LOS: 4 days    Time spent: 35 mins    Shayle Donahoo, MD [facility]   If  7PM-7AM, please contact night-coverage

## 2020-08-04 NOTE — Progress Notes (Signed)
Progress Note  Patient Name: Matthew Barnes Date of Encounter: 08/04/2020  Primary Cardiologist:   Quay Burow, MD   Subjective   He is talking relatively clearly.  Still with some difficulty finding words and some obvious confusion.  He denies pain or SOB except he does report coughing when lying flat.   Inpatient Medications    Scheduled Meds: .  stroke: mapping our early stages of recovery book   Does not apply Once  . aspirin  300 mg Rectal Daily  . atorvastatin  20 mg Oral Daily  . carvedilol  6.25 mg Oral BID WC  . gatifloxacin  1 drop Both Eyes QID  . guaiFENesin  600 mg Oral BID  . insulin aspart  0-15 Units Subcutaneous TID WC  . insulin aspart  0-5 Units Subcutaneous QHS  . insulin detemir  10 Units Subcutaneous BID  . lithium carbonate  900 mg Oral Daily  . pantoprazole  40 mg Oral BID AC  . QUEtiapine  400 mg Oral QHS  . sodium chloride flush  3 mL Intravenous Q12H  . thiamine  100 mg Oral Daily   Continuous Infusions: . sodium chloride    . heparin 1,850 Units/hr (08/04/20 0626)   PRN Meds: sodium chloride, acetaminophen **OR** acetaminophen, metoprolol tartrate, ondansetron **OR** ondansetron (ZOFRAN) IV, QUEtiapine, sodium chloride flush   Vital Signs    Vitals:   08/03/20 1242 08/03/20 1619 08/03/20 2128 08/04/20 0443  BP: (!) 154/92 125/80 (!) 126/97 133/87  Pulse: 96 88 100 95  Resp:   20 18  Temp:   98.2 F (36.8 C) 98.1 F (36.7 C)  TempSrc:   Oral Oral  SpO2:   100% 98%  Weight:      Height:        Intake/Output Summary (Last 24 hours) at 08/04/2020 0801 Last data filed at 08/04/2020 0500 Gross per 24 hour  Intake 830.5 ml  Output --  Net 830.5 ml   Filed Weights   07/30/20 2029 07/31/20 1522  Weight: 132.2 kg 125.8 kg    Telemetry    Not on tele - Personally Reviewed  ECG    NA - Personally Reviewed  Physical Exam   GEN: No acute distress.   Neck: No  JVD Cardiac: RRR, no murmurs, rubs, or gallops.  Respiratory:       Few basilar crackles.  GI: Soft, nontender, non-distended  MS: No edema; No deformity. Neuro:    Non focal except aphasia Psych: Normal affect but some confusion.   Labs    Chemistry Recent Labs  Lab 08/01/20 0344 08/02/20 0358 08/03/20 0327 08/04/20 0437  NA 143 143 142 145  K 3.8 3.7 4.0 4.1  CL 108 109 107 112*  CO2 24 24 25 24   GLUCOSE 114* 136* 116* 134*  BUN 14 18 15 20   CREATININE 1.08 1.38* 1.19 1.29*  CALCIUM 9.1 8.8* 8.7* 9.3  PROT 6.5  --  6.0* 6.4*  ALBUMIN 3.6  --  3.3* 3.4*  AST 38  --  33 29  ALT 32  --  35 30  ALKPHOS 40  --  39 38  BILITOT 1.0  --  0.5 0.4  GFRNONAA >60 57* >60 >60  ANIONGAP 11 10 10 9      Hematology Recent Labs  Lab 08/02/20 0358 08/03/20 0327 08/04/20 0437  WBC 5.5 5.2 4.7  RBC 4.36 4.56 4.50  HGB 10.8* 11.6* 11.3*  HCT 36.4* 39.4 38.2*  MCV 83.5 86.4 84.9  MCH 24.8* 25.4* 25.1*  MCHC 29.7* 29.4* 29.6*  RDW 14.2 14.1 14.1  PLT 173 162 169    Cardiac EnzymesNo results for input(s): TROPONINI in the last 168 hours. No results for input(s): TROPIPOC in the last 168 hours.   BNP Recent Labs  Lab 07/31/20 0007  BNP 372.3*     DDimer  Recent Labs  Lab 07/31/20 0836  DDIMER 1.19*     Radiology    MR ANGIO HEAD WO CONTRAST  Result Date: 08/03/2020 CLINICAL DATA:  Abnormal speech. Patient was unable to tolerate additional imaging including coronal images or postcontrast images. EXAM: MRI HEAD WITHOUT CONTRAST MRA HEAD WITHOUT CONTRAST TECHNIQUE: Multiplanar, multiecho pulse sequences of the brain and surrounding structures were obtained without intravenous contrast. Angiographic images of the head were obtained using MRA technique without contrast. COMPARISON:  CT head without contrast 07/30/2020. MR head without and with contrast 07/13/2019 FINDINGS: MRI HEAD FINDINGS Brain: Axial diffusion-weighted images demonstrate no acute or subacute infarction. Patient did not tolerate coronal diffusion images. Mild atrophy and  white matter changes are stable and within normal limits for age. The ventricles are of normal size. No significant extraaxial fluid collection is present. The internal auditory canals are within normal limits. The brainstem and cerebellum are within normal limits. Vascular: Flow is present in the major intracranial arteries. Skull and upper cervical spine: The craniocervical junction is normal. 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 Marrow signal is somewhat depressed diffusely. No discrete lesions present. Sinuses/Orbits: Mild mucosal thickening is present in the right maxillary sinus and scattered throughout the right ethmoid air cells. No fluid levels are present. Left sphenoid sinus mucosal thickening noted. There is some fluid in the right mastoid air cells. No obstructing nasopharyngeal lesion is present. The globes and orbits are within normal limits. MRA HEAD FINDINGS Mild tortuosity is present high cervical segments without significant stenosis. There is no stenosis through the ICA termini. A1 and M1 segments are within normal limits. No definite anterior communicating artery is present. MCA bifurcations are intact. ACA and MCA branch vessels are within normal limits. There is some attenuation due to artifact. Vertebrobasilar junction visualized. Lower vertebral arteries and PICA origins not seen. Left AICA is dominant. Basilar artery is normal. Both posterior cerebral arteries originate from basilar tip. PCA branch vessels are within normal limits. IMPRESSION: 1. Normal MRI appearance of the brain for age. 2. Mild sinus disease and right mastoid effusion. No obstructing nasopharyngeal lesion is present. 3. Normal MRA Circle of Willis without evidence for significant proximal stenosis, aneurysm, or branch vessel occlusion. Electronically Signed   By: San Morelle M.D.   On: 08/03/2020 18:04   MR BRAIN WO CONTRAST  Result Date: 08/03/2020 CLINICAL DATA:  Abnormal speech. Patient was unable to tolerate  additional imaging including coronal images or postcontrast images. EXAM: MRI HEAD WITHOUT CONTRAST MRA HEAD WITHOUT CONTRAST TECHNIQUE: Multiplanar, multiecho pulse sequences of the brain and surrounding structures were obtained without intravenous contrast. Angiographic images of the head were obtained using MRA technique without contrast. COMPARISON:  CT head without contrast 07/30/2020. MR head without and with contrast 07/13/2019 FINDINGS: MRI HEAD FINDINGS Brain: Axial diffusion-weighted images demonstrate no acute or subacute infarction. Patient did not tolerate coronal diffusion images. Mild atrophy and white matter changes are stable and within normal limits for age. The ventricles are of normal size. No significant extraaxial fluid collection is present. The internal auditory canals are within normal limits. The brainstem and cerebellum are within normal limits. Vascular: Flow  is present in the major intracranial arteries. Skull and upper cervical spine: The craniocervical junction is normal. 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 Marrow signal is somewhat depressed diffusely. No discrete lesions present. Sinuses/Orbits: Mild mucosal thickening is present in the right maxillary sinus and scattered throughout the right ethmoid air cells. No fluid levels are present. Left sphenoid sinus mucosal thickening noted. There is some fluid in the right mastoid air cells. No obstructing nasopharyngeal lesion is present. The globes and orbits are within normal limits. MRA HEAD FINDINGS Mild tortuosity is present high cervical segments without significant stenosis. There is no stenosis through the ICA termini. A1 and M1 segments are within normal limits. No definite anterior communicating artery is present. MCA bifurcations are intact. ACA and MCA branch vessels are within normal limits. There is some attenuation due to artifact. Vertebrobasilar junction visualized. Lower vertebral arteries and PICA origins not seen. Left AICA  is dominant. Basilar artery is normal. Both posterior cerebral arteries originate from basilar tip. PCA branch vessels are within normal limits. IMPRESSION: 1. Normal MRI appearance of the brain for age. 2. Mild sinus disease and right mastoid effusion. No obstructing nasopharyngeal lesion is present. 3. Normal MRA Circle of Willis without evidence for significant proximal stenosis, aneurysm, or branch vessel occlusion. Electronically Signed   By: San Morelle M.D.   On: 08/03/2020 18:04   ECHOCARDIOGRAM LIMITED  Result Date: 08/03/2020    ECHOCARDIOGRAM LIMITED REPORT   Patient Name:   YOSHIMI SARR Date of Exam: 08/03/2020 Medical Rec #:  [id number]    Height:       73.0 in Accession #:    [id number]   Weight:       277.3 lb Date of Birth:  Jun 13, 1955   BSA:          2.472 m Patient Age:    21 years     BP:           154/92 mmHg Patient Gender: M            HR:           96 bpm. Exam Location:  Inpatient Procedure: Limited Echo and Intracardiac Opacification Agent Indications:    Rule out thrombus/ Stroke  History:        Patient has prior history of Echocardiogram examinations, most                 recent 08/01/2020.  Sonographer:    Merrie Roof Referring Phys: Vernal Comments: This was a limited echo to rule out thrombus. IMPRESSIONS  1. Limited contrast echo for LV thrombus evaluation. No evidence of LV thrombus. Left ventricular ejection fraction, by estimation, is 20 to 25%. The left ventricle has severely decreased function. The left ventricle demonstrates global hypokinesis. Conclusion(s)/Recommendation(s): No left ventricular mural or apical thrombus/thrombi. FINDINGS  Left Ventricle: Limited contrast echo for LV thrombus evaluation. No evidence of LV thrombus. Left ventricular ejection fraction, by estimation, is 20 to 25%. The left ventricle has severely decreased function. The left ventricle demonstrates global hypokinesis. Definity contrast agent was given IV to  delineate the left ventricular endocardial borders. Eleonore Chiquito MD Electronically signed by Eleonore Chiquito MD Signature Date/Time: 08/03/2020/5:16:04 PM    Final     Cardiac Studies   ECHO:   1. No subcostal images.  2. Left ventricular ejection fraction, by estimation, is 20 to 25%. The  left ventricle has severely decreased function. The left ventricle  demonstrates global hypokinesis.  The left ventricular internal cavity size  was severely dilated. Left ventricular  diastolic parameters are indeterminate.  3. Right ventricular systolic function is normal. The right ventricular  size is normal. There is mildly elevated pulmonary artery systolic  pressure.  4. The pericardial effusion is posterior to the left ventricle.  5. The mitral valve is normal in structure. Mild mitral valve  regurgitation. No evidence of mitral stenosis.  6. The aortic valve is tricuspid. Aortic valve regurgitation is not  visualized. Mild aortic valve sclerosis is present, with no evidence of  aortic valve stenosis.  7. The inferior vena cava is normal in size with greater than 50%  respiratory variability, suggesting right atrial pressure of 3 mmHg.   Patient Profile     65 y.o. male with a hx of hypertension, hyperlipidemia, diabetes mellitus and bipolar disorderwho is being seen for the evaluation of low EFat the request of Dr. Dwyane Dee.  Assessment & Plan    APHASIA:  Acute stroke not evident on CNS imaging.  Per neurology.    ACUTE ON CHRONIC SYSTOLIC HF:   EF is 20 - 25%.   Possibly an ischemic etiology.  Cath is planned likely for Monday.    On heparin given reduced EF and possible unstable angina.   Discussed procedure with him.    HTN:  Coreg increased yesterday.  Holding off on Entresto until after cath.    For questions or updates, please contact [place] Please consult [email or web address] for contact info under Cardiology/STEMI.   Signed, Minus Breeding, MD  08/04/2020, 8:01 AM

## 2020-08-04 NOTE — Progress Notes (Signed)
   08/04/20 1800  Vitals  BP (!) 142/101  BP Location Right Arm  BP Method Automatic  Patient Position (if appropriate) Sitting  Pulse Rate 96  Level of Consciousness  Level of Consciousness Alert  Oxygen Therapy  SpO2 98 %  O2 Device Room Air  Pain Assessment  Pain Scale 0-10  Pain Score 0  Pre-blood pressure prior to receiving night time carvedilol. Made MD Pardeep aware. No new orders at this time.  Will continue to monitor.

## 2020-08-04 NOTE — Progress Notes (Signed)
[place] for IV heparin Indication: chest pain/ACS  Allergies  Allergen Reactions  . Influenza Vaccines Swelling    Reports of swelling of the arm and then sick for 10 days   Patient Measurements: Height: 6\' 1"  (185.4 cm) Weight: 125.8 kg (277 lb 5.4 oz) IBW/kg (Calculated) : 79.9 Heparin Dosing Weight: 117 kg  Vital Signs: Temp: 98.1 F (36.7 C) (05/07 0443) Temp Source: Oral (05/07 0443) BP: 113/73 (05/07 1243) Pulse Rate: 89 (05/07 1243)  Labs: Recent Labs    08/02/20 0358 08/03/20 0327 08/04/20 0437 08/04/20 1323  HGB 10.8* 11.6* 11.3*  --   HCT 36.4* 39.4 38.2*  --   PLT 173 162 169  --   HEPARINUNFRC  --   --  <0.10* <0.10*  CREATININE 1.38* 1.19 1.29*  --    Estimated Creatinine Clearance: 80.4 mL/min (A) (by C-G formula based on SCr of 1.29 mg/dL (H)).  Medical History: Past Medical History:  Diagnosis Date  . Bipolar II disorder - Managed by Marye Round [phone]) 05/03/2013  . Diabetes mellitus   . Dysphagia   . Hyperlipidemia   . Hypertension   . Unspecified hypothyroidism 05/03/2013    Medications:  Scheduled:  .  stroke: mapping our early stages of recovery book   Does not apply Once  . aspirin  300 mg Rectal Daily  . atorvastatin  20 mg Oral Daily  . carvedilol  6.25 mg Oral BID WC  . gatifloxacin  1 drop Both Eyes QID  . guaiFENesin  600 mg Oral BID  . insulin aspart  0-15 Units Subcutaneous TID WC  . insulin aspart  0-5 Units Subcutaneous QHS  . insulin detemir  10 Units Subcutaneous BID  . lithium carbonate  900 mg Oral Daily  . pantoprazole  40 mg Oral BID AC  . QUEtiapine  400 mg Oral QHS  . sodium chloride flush  3 mL Intravenous Q12H  . thiamine  100 mg Oral Daily   Assessment: 71 yoM with PMH DM2, HLD, HTN, hypothyroid, bipolar II, admitted 5/2 for possible stroke. CT head on admission negative for bleed. Patient required MRI with sedation which was not available until 5/6. Today  Cardiology concerned for ACS and Pharmacy consulted for IV heparin. Did complete MRI prior to starting heparin, which showed no new bleeding or infarct.   Baseline INR, aPTT: not done  Prior anticoagulation: heparin SQ 5000 units TID, last dose 5/6 at 0600  Significant events:  Today, 08/04/2020:  HL again < 0.1 on 1850 units/hr, initial HL was < 0.1  CBC: low but stable; Plt stable NWL  No bleeding or infusion issues per nursing  SCr slightly elevated but stable this admission  Goal of Therapy: Heparin level 0.3-0.7 units/ml Monitor platelets by anticoagulation protocol: Yes  Plan:  Heparin 3500 units IV bolus x 1  Increase heparin drip to 2150 units/hr  Check heparin level 6 hrs   Daily CBC, daily heparin level once stable  Monitor for signs of bleeding or thrombosis  Minda Ditto PharmD 08/04/2020, 2:42 PM

## 2020-08-04 NOTE — Progress Notes (Signed)
[place] for IV heparin Indication: chest pain/ACS  Allergies  Allergen Reactions  . Influenza Vaccines Swelling    Reports of swelling of the arm and then sick for 10 days   Patient Measurements: Height: 6\' 1"  (185.4 cm) Weight: 125.8 kg (277 lb 5.4 oz) IBW/kg (Calculated) : 79.9 Heparin Dosing Weight: 117 kg  Vital Signs: Temp: 97.9 F (36.6 C) (05/07 2220) Temp Source: Oral (05/07 2220) BP: 135/82 (05/07 2220) Pulse Rate: 91 (05/07 2220)  Labs: Recent Labs    08/02/20 0358 08/03/20 0327 08/04/20 0437 08/04/20 1323 08/04/20 2113  HGB 10.8* 11.6* 11.3*  --   --   HCT 36.4* 39.4 38.2*  --   --   PLT 173 162 169  --   --   HEPARINUNFRC  --   --  <0.10* <0.10* 0.40  CREATININE 1.38* 1.19 1.29*  --   --    Estimated Creatinine Clearance: 80.4 mL/min (A) (by C-G formula based on SCr of 1.29 mg/dL (H)).  Medical History: Past Medical History:  Diagnosis Date  . Bipolar II disorder - Managed by Marye Round [phone]) 05/03/2013  . Diabetes mellitus   . Dysphagia   . Hyperlipidemia   . Hypertension   . Unspecified hypothyroidism 05/03/2013    Medications:  Scheduled:  .  stroke: mapping our early stages of recovery book   Does not apply Once  . aspirin  300 mg Rectal Daily  . atorvastatin  20 mg Oral Daily  . carvedilol  6.25 mg Oral BID WC  . gatifloxacin  1 drop Both Eyes QID  . guaiFENesin  600 mg Oral BID  . insulin aspart  0-15 Units Subcutaneous TID WC  . insulin aspart  0-5 Units Subcutaneous QHS  . insulin detemir  10 Units Subcutaneous BID  . lithium carbonate  900 mg Oral Daily  . pantoprazole  40 mg Oral BID AC  . QUEtiapine  400 mg Oral QHS  . sodium chloride flush  3 mL Intravenous Q12H  . thiamine  100 mg Oral Daily   Assessment: 58 yoM with PMH DM2, HLD, HTN, hypothyroid, bipolar II, admitted 5/2 for possible stroke. CT head on admission negative for bleed. Patient required MRI with sedation  which was not available until 5/6. Today Cardiology concerned for ACS and Pharmacy consulted for IV heparin. Did complete MRI prior to starting heparin, which showed no new bleeding or infarct.   Baseline INR, aPTT: not done  Prior anticoagulation: heparin SQ 5000 units TID, last dose 5/6 at 0600  Significant events:  Today, 08/04/2020:  HL again < 0.1 on 1850 units/hr, initial HL was < 0.1  CBC: low but stable; Plt stable NWL  No bleeding or infusion issues per nursing  SCr slightly elevated but stable this admission  2113 HL =0.4 therapeutic on 2150 units/hr  Goal of Therapy: Heparin level 0.3-0.7 units/ml Monitor platelets by anticoagulation protocol: Yes  Plan:  continue heparin drip at 2150 units/hr  Check heparin level 6 hrs   Daily CBC, daily heparin level once stable  Monitor for signs of bleeding or thrombosis  Dolly Rias RPh 08/04/2020, 10:43 PM \

## 2020-08-05 ENCOUNTER — Encounter (HOSPITAL_COMMUNITY): Payer: Self-pay | Admitting: Internal Medicine

## 2020-08-05 DIAGNOSIS — F3160 Bipolar disorder, current episode mixed, unspecified: Secondary | ICD-10-CM

## 2020-08-05 DIAGNOSIS — R4701 Aphasia: Secondary | ICD-10-CM | POA: Diagnosis not present

## 2020-08-05 LAB — BASIC METABOLIC PANEL
Anion gap: 8 (ref 5–15)
BUN: 19 mg/dL (ref 8–23)
CO2: 27 mmol/L (ref 22–32)
Calcium: 9 mg/dL (ref 8.9–10.3)
Chloride: 107 mmol/L (ref 98–111)
Creatinine, Ser: 1.49 mg/dL — ABNORMAL HIGH (ref 0.61–1.24)
GFR, Estimated: 52 mL/min — ABNORMAL LOW (ref >60)
Glucose, Bld: 99 mg/dL (ref 70–99)
Potassium: 4.7 mmol/L (ref 3.5–5.1)
Sodium: 142 mmol/L (ref 135–145)

## 2020-08-05 LAB — CBC
HCT: 41.3 % (ref 39.0–52.0)
Hemoglobin: 12.3 g/dL — ABNORMAL LOW (ref 13.0–17.0)
MCH: 25.4 pg — ABNORMAL LOW (ref 26.0–34.0)
MCHC: 29.8 g/dL — ABNORMAL LOW (ref 30.0–36.0)
MCV: 85.2 fL (ref 80.0–100.0)
Platelets: 173 10*3/uL (ref 150–400)
RBC: 4.85 MIL/uL (ref 4.22–5.81)
RDW: 14.5 % (ref 11.5–15.5)
WBC: 5.4 10*3/uL (ref 4.0–10.5)
nRBC: 0 % (ref 0.0–0.2)

## 2020-08-05 LAB — CULTURE, BLOOD (ROUTINE X 2)
Culture: NO GROWTH
Culture: NO GROWTH
Special Requests: ADEQUATE

## 2020-08-05 LAB — MAGNESIUM: Magnesium: 2.2 mg/dL (ref 1.7–2.4)

## 2020-08-05 LAB — GLUCOSE, CAPILLARY
Glucose-Capillary: 129 mg/dL — ABNORMAL HIGH (ref 70–99)
Glucose-Capillary: 140 mg/dL — ABNORMAL HIGH (ref 70–99)
Glucose-Capillary: 101 mg/dL — ABNORMAL HIGH (ref 70–99)
Glucose-Capillary: 127 mg/dL — ABNORMAL HIGH (ref 70–99)

## 2020-08-05 LAB — PHOSPHORUS: Phosphorus: 4.2 mg/dL (ref 2.5–4.6)

## 2020-08-05 LAB — HEPARIN LEVEL (UNFRACTIONATED): Heparin Unfractionated: 0.33 IU/mL (ref 0.30–0.70)

## 2020-08-05 MED ORDER — ASPIRIN 81 MG PO CHEW
81.0000 mg | CHEWABLE_TABLET | ORAL | Status: AC
  Administered 2020-08-06: 81 mg via ORAL
  Filled 2020-08-05: qty 1

## 2020-08-05 MED ORDER — SODIUM CHLORIDE 0.9 % IV SOLN: INTRAVENOUS | Status: DC

## 2020-08-05 MED ORDER — SODIUM CHLORIDE 0.9 % IV SOLN: 250.0000 mL | INTRAVENOUS | Status: DC | PRN

## 2020-08-05 MED ORDER — SODIUM CHLORIDE 0.9% FLUSH: 3.0000 mL | INTRAVENOUS | Status: DC | PRN

## 2020-08-05 NOTE — Progress Notes (Addendum)
Progress Note    Matthew Barnes  ZOX:[id number] DOB: [birth year]  DOA: 07/30/2020 PCP: System, Provider Not In      Brief Narrative:    Medical records reviewed and are as summarized below:  Matthew Barnes is a 65 y.o. male       Assessment/Plan:   Principal Problem:   Aphasia Active Problems:   Hypertension associated with diabetes ([place])   Hyperlipidemia   Type 2 diabetes mellitus with diabetic neuropathy, with long-term current use of insulin (H[facility])   Esophageal reflux   Bipolar affective, mixed ([place])   AMS (altered mental status)    Body mass index is 36.59 kg/m.  (Morbid obesity) we will  Aphasia: Improved.  Acute stroke has been ruled out.  No evidence of ischemia/stroke on MRI brain.  Acute on chronic systolic CHF: 2D echo showed EF estimated at 20 to 25%.  Plan for cardiac cath tomorrow to rule out ischemia.  He is on IV heparin drip for possible unstable angina.  Monitor heparin level per protocol  AKI versus CKD stage IIIa: Repeat BMP tomorrow.  Hypertension: Continue antihypertensives  Insulin-dependent diabetes mellitus: Continue Levemir and NovoLog.  Bipolar disorder: Continue psychotropics     Diet Order            Diet NPO time specified Except for: Sips with Meds  Diet effective midnight           Diet Carb Modified Fluid consistency: Thin; Room service appropriate? Yes  Diet effective now                    Consultants: Cardiologist  Procedures:  None    Medications:   .  stroke: mapping our early stages of recovery book   Does not apply Once  . [START ON 08/06/2020] aspirin  81 mg Oral Pre-Cath  . atorvastatin  20 mg Oral Daily  . carvedilol  6.25 mg Oral BID WC  . gatifloxacin  1 drop Both Eyes QID  . guaiFENesin  600 mg Oral BID  . insulin aspart  0-15 Units Subcutaneous TID WC  . insulin aspart  0-5 Units Subcutaneous QHS  . insulin detemir  10 Units Subcutaneous BID  . lithium carbonate  900 mg Oral Daily  .  pantoprazole  40 mg Oral BID AC  . QUEtiapine  400 mg Oral QHS  . sodium chloride flush  3 mL Intravenous Q12H  . thiamine  100 mg Oral Daily   Continuous Infusions: . sodium chloride    . sodium chloride    . [START ON 08/06/2020] sodium chloride    . heparin 2,150 Units/hr (08/05/20 1046)     Anti-infectives (From admission, onward)   Start     Dose/Rate Route Frequency Ordered Stop   08/02/20 0630  azithromycin (ZITHROMAX) tablet 500 mg  Status:  Discontinued        500 mg Oral Daily 08/02/20 0539 08/03/20 1500   07/31/20 0400  cefTRIAXone (ROCEPHIN) 1 g in sodium chloride 0.9 % 100 mL IVPB        1 g 200 mL/hr over 30 Minutes Intravenous  Once 07/31/20 0355 07/31/20 0545   07/31/20 0400  azithromycin (ZITHROMAX) 500 mg in sodium chloride 0.9 % 250 mL IVPB  Status:  Discontinued        500 mg 250 mL/hr over 60 Minutes Intravenous Every 24 hours 07/31/20 0355 08/02/20 0539  Family Communication/Anticipated D/C date and plan/Code Status   DVT prophylaxis: SCDs Start: 07/31/20 0017     Code Status: Full Code  Family Communication: None Disposition Plan:    Status is: Inpatient  Remains inpatient appropriate because:IV treatments appropriate due to intensity of illness or inability to take PO   Dispo: The patient is from: Home              Anticipated d/c is to: Home              Patient currently is not medically stable to d/c.   Difficult to place patient No           Subjective:   Interval events noted.  No chest pain or shortness of breath.  Her nurse and CNA werw at the bedside.  Objective:    Vitals:   08/04/20 1800 08/04/20 2220 08/05/20 0622 08/05/20 1243  BP: (!) 142/101 135/82 120/66 126/83  Pulse: 96 91 82 94  Resp:  20 18 20   Temp:  97.9 F (36.6 C) 98.5 F (36.9 C) 98.2 F (36.8 C)  TempSrc:  Oral Oral Oral  SpO2: 98% 96% 100% 98%  Weight:      Height:       No data found.   Intake/Output Summary (Last 24  hours) at 08/05/2020 1353 Last data filed at 08/05/2020 0600 Gross per 24 hour  Intake 394.5 ml  Output --  Net 394.5 ml   Filed Weights   07/30/20 2029 07/31/20 1522  Weight: 132.2 kg 125.8 kg    Exam:  GEN: NAD SKIN: No rash EYES: EOMI ENT: MMM CV: RRR PULM: CTA B ABD: soft, obese, NT, +BS CNS: AAO x 3, non focal EXT: B/l leg edema 1+ (R>L), no tenderness         Data Reviewed:   I have personally reviewed following labs and imaging studies:  Labs: Labs show the following:   Basic Metabolic Panel: Recent Labs  Lab 08/01/20 0344 08/02/20 0358 08/03/20 0327 08/04/20 0437 08/05/20 0338  NA 143 143 142 145 142  K 3.8 3.7 4.0 4.1 4.7  CL 108 109 107 112* 107  CO2 24 24 25 24 27   GLUCOSE 114* 136* 116* 134* 99  BUN 14 18 15 20 19   CREATININE 1.08 1.38* 1.19 1.29* 1.49*  CALCIUM 9.1 8.8* 8.7* 9.3 9.0  MG 2.0 2.0 2.0 2.1 2.2  PHOS 4.0 4.7* 3.9 4.2 4.2   GFR Estimated Creatinine Clearance: 69.6 mL/min (A) (by C-G formula based on SCr of 1.49 mg/dL (H)). Liver Function Tests: Recent Labs  Lab 07/30/20 2101 07/31/20 0643 08/01/20 0344 08/03/20 0327 08/04/20 0437  AST 33 35 38 33 29  ALT 30 33 32 35 30  ALKPHOS 49 50 40 39 38  BILITOT 1.2 1.0 1.0 0.5 0.4  PROT 7.3 7.2 6.5 6.0* 6.4*  ALBUMIN 4.0 4.1 3.6 3.3* 3.4*   No results for input(s): LIPASE, AMYLASE in the last 168 hours. Recent Labs  Lab 07/31/20 0740  AMMONIA 29   Coagulation profile No results for input(s): INR, PROTIME in the last 168 hours.  CBC: Recent Labs  Lab 07/30/20 2101 07/31/20 0643 08/01/20 0344 08/02/20 0358 08/03/20 0327 08/04/20 0437 08/05/20 0338  WBC 8.4   < > 7.3 5.5 5.2 4.7 5.4  NEUTROABS 5.5  --   --   --   --   --   --   HGB 12.2*   < > 11.7* 10.8* 11.6* 11.3* 12.3*  HCT 40.3   < > 38.3* 36.4* 39.4 38.2* 41.3  MCV 83.1   < > 84.0 83.5 86.4 84.9 85.2  PLT 203   < > 186 173 162 169 173   < > = values in this interval not displayed.   Cardiac  Enzymes: Recent Labs  Lab 07/31/20 0127 07/31/20 0643  CKTOTAL 404* 417*   BNP (last 3 results) No results for input(s): PROBNP in the last 8760 hours. CBG: Recent Labs  Lab 08/04/20 1127 08/04/20 1619 08/04/20 2152 08/05/20 0755 08/05/20 1134  GLUCAP 185* 144* 117* 129* 140*   D-Dimer: No results for input(s): DDIMER in the last 72 hours. Hgb A1c: No results for input(s): HGBA1C in the last 72 hours. Lipid Profile: No results for input(s): CHOL, HDL, LDLCALC, TRIG, CHOLHDL, LDLDIRECT in the last 72 hours. Thyroid function studies: No results for input(s): TSH, T4TOTAL, T3FREE, THYROIDAB in the last 72 hours.  Invalid input(s): FREET3 Anemia work up: No results for input(s): VITAMINB12, FOLATE, FERRITIN, TIBC, IRON, RETICCTPCT in the last 72 hours. Sepsis Labs: Recent Labs  Lab 07/31/20 0127 07/31/20 0409 07/31/20 7341 07/31/20 1702 08/01/20 0344 08/02/20 0358 08/03/20 0327 08/04/20 0437 08/05/20 0338  PROCALCITON  --   --   --  <0.10  --   --   --   --   --   WBC  --   --    < >  --    < > 5.5 5.2 4.7 5.4  LATICACIDVEN 1.4 1.4  --   --   --   --   --   --   --    < > = values in this interval not displayed.    Microbiology Recent Results (from the past 240 hour(s))  Culture, Urine     Status: None   Collection Time: 07/30/20  9:00 PM   Specimen: Urine, Random  Result Value Ref Range Status   Specimen Description   Final    URINE, RANDOM Performed at [facility] [address] [place], [place] [address]    Special Requests   Final    NONE Performed at [facility], [place] [address] [place], [place] [address]    Culture   Final    NO GROWTH Performed at [facility], [place] [address], [place], [place] [address]    Report Status 08/01/2020 FINAL  Final  Resp Panel by RT-PCR (Flu A&B, Covid) Nasopharyngeal Swab     Status: None   Collection Time: 07/30/20 11:15 PM   Specimen: Nasopharyngeal Swab;  Nasopharyngeal(NP) swabs in vial transport medium  Result Value Ref Range Status   SARS Coronavirus 2 by RT PCR NEGATIVE NEGATIVE Final    Comment: (NOTE) SARS-CoV-2 target nucleic acids are NOT DETECTED.  The SARS-CoV-2 RNA is generally detectable in upper respiratory specimens during the acute phase of infection. The lowest concentration of SARS-CoV-2 viral copies this assay can detect is 138 copies/mL. A negative result does not preclude SARS-Cov-2 infection and should not be used as the sole basis for treatment or other patient management decisions. A negative result may occur with  improper specimen collection/handling, submission of specimen other than nasopharyngeal swab, presence of viral mutation(s) within the areas targeted by this assay, and inadequate number of viral copies(<138 copies/mL). A negative result must be combined with clinical observations, patient history, and epidemiological information. The expected result is Negative.  Fact Sheet for Patients:  EntrepreneurPulse.com.au  Fact Sheet for Healthcare Providers:  IncredibleEmployment.be  This test is no t yet approved or cleared by the [place] and  has been authorized for detection and/or diagnosis of SARS-CoV-2 by FDA under an Emergency Use Authorization (EUA). This EUA will remain  in effect (meaning this test can be used) for the duration of the COVID-19 declaration under Section 564(b)(1) of the Act, 21 U.S.C.section 360bbb-3(b)(1), unless the authorization is terminated  or revoked sooner.       Influenza A by PCR NEGATIVE NEGATIVE Final   Influenza B by PCR NEGATIVE NEGATIVE Final    Comment: (NOTE) The Xpert Xpress SARS-CoV-2/FLU/RSV plus assay is intended as an aid in the diagnosis of influenza from Nasopharyngeal swab specimens and should not be used as a sole basis for treatment. Nasal washings and aspirates are unacceptable for Xpert Xpress  SARS-CoV-2/FLU/RSV testing.  Fact Sheet for Patients: EntrepreneurPulse.com.au  Fact Sheet for Healthcare Providers: IncredibleEmployment.be  This test is not yet approved or cleared by the [place] FDA and has been authorized for detection and/or diagnosis of SARS-CoV-2 by FDA under an Emergency Use Authorization (EUA). This EUA will remain in effect (meaning this test can be used) for the duration of the COVID-19 declaration under Section 564(b)(1) of the Act, 21 U.S.C. section 360bbb-3(b)(1), unless the authorization is terminated or revoked.  Performed at [facility], [place] [address], [place], [place] [address]   Culture, blood (Routine X 2) w Reflex to ID Panel     Status: None   Collection Time: 07/31/20  1:27 AM   Specimen: BLOOD  Result Value Ref Range Status   Specimen Description   Final    BLOOD LEFT ANTECUBITAL Performed at [facility] [address], [place], [place] [address]    Special Requests   Final    BOTTLES DRAWN AEROBIC AND ANAEROBIC Blood Culture adequate volume Performed at [facility] [address] [place], [place] [address]    Culture   Final    NO GROWTH 5 DAYS Performed at [facility], [place] [address] [place], [place] [address]    Report Status 08/05/2020 FINAL  Final  Culture, blood (Routine X 2) w Reflex to ID Panel     Status: None   Collection Time: 07/31/20  4:09 AM   Specimen: BLOOD  Result Value Ref Range Status   Specimen Description   Final    BLOOD RIGHT ANTECUBITAL Performed at [facility] [address], [place], [place] [address]    Special Requests   Final    BOTTLES DRAWN AEROBIC AND ANAEROBIC Blood Culture results may not be optimal due to an inadequate volume of blood received in culture bottles Performed at [facility] [address] [place], [place] [address]    Culture    Final    NO GROWTH 5 DAYS Performed at [facility], [place] [address], [place], [place] [address]    Report Status 08/05/2020 FINAL  Final    Procedures and diagnostic studies:  MR ANGIO HEAD WO CONTRAST  Result Date: 08/03/2020 CLINICAL DATA:  Abnormal speech. Patient was unable to tolerate additional imaging including coronal images or postcontrast images. EXAM: MRI HEAD WITHOUT CONTRAST MRA HEAD WITHOUT CONTRAST TECHNIQUE: Multiplanar, multiecho pulse sequences of the brain and surrounding structures were obtained without intravenous contrast. Angiographic images of the head were obtained using MRA technique without contrast. COMPARISON:  CT head without contrast 07/30/2020. MR head without and with contrast 07/13/2019  FINDINGS: MRI HEAD FINDINGS Brain: Axial diffusion-weighted images demonstrate no acute or subacute infarction. Patient did not tolerate coronal diffusion images. Mild atrophy and white matter changes are stable and within normal limits for age. The ventricles are of normal size. No significant extraaxial fluid collection is present. The internal auditory canals are within normal limits. The brainstem and cerebellum are within normal limits. Vascular: Flow is present in the major intracranial arteries. Skull and upper cervical spine: The craniocervical junction is normal. 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 Marrow signal is somewhat depressed diffusely. No discrete lesions present. Sinuses/Orbits: Mild mucosal thickening is present in the right maxillary sinus and scattered throughout the right ethmoid air cells. No fluid levels are present. Left sphenoid sinus mucosal thickening noted. There is some fluid in the right mastoid air cells. No obstructing nasopharyngeal lesion is present. The globes and orbits are within normal limits. MRA HEAD FINDINGS Mild tortuosity is present high cervical segments without significant stenosis. There is no stenosis through the ICA termini. A1 and M1  segments are within normal limits. No definite anterior communicating artery is present. MCA bifurcations are intact. ACA and MCA branch vessels are within normal limits. There is some attenuation due to artifact. Vertebrobasilar junction visualized. Lower vertebral arteries and PICA origins not seen. Left AICA is dominant. Basilar artery is normal. Both posterior cerebral arteries originate from basilar tip. PCA branch vessels are within normal limits. IMPRESSION: 1. Normal MRI appearance of the brain for age. 2. Mild sinus disease and right mastoid effusion. No obstructing nasopharyngeal lesion is present. 3. Normal MRA Circle of Willis without evidence for significant proximal stenosis, aneurysm, or branch vessel occlusion. Electronically Signed   By: San Morelle M.D.   On: 08/03/2020 18:04   MR BRAIN WO CONTRAST  Result Date: 08/03/2020 CLINICAL DATA:  Abnormal speech. Patient was unable to tolerate additional imaging including coronal images or postcontrast images. EXAM: MRI HEAD WITHOUT CONTRAST MRA HEAD WITHOUT CONTRAST TECHNIQUE: Multiplanar, multiecho pulse sequences of the brain and surrounding structures were obtained without intravenous contrast. Angiographic images of the head were obtained using MRA technique without contrast. COMPARISON:  CT head without contrast 07/30/2020. MR head without and with contrast 07/13/2019 FINDINGS: MRI HEAD FINDINGS Brain: Axial diffusion-weighted images demonstrate no acute or subacute infarction. Patient did not tolerate coronal diffusion images. Mild atrophy and white matter changes are stable and within normal limits for age. The ventricles are of normal size. No significant extraaxial fluid collection is present. The internal auditory canals are within normal limits. The brainstem and cerebellum are within normal limits. Vascular: Flow is present in the major intracranial arteries. Skull and upper cervical spine: The craniocervical junction is normal.  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 Marrow signal is somewhat depressed diffusely. No discrete lesions present. Sinuses/Orbits: Mild mucosal thickening is present in the right maxillary sinus and scattered throughout the right ethmoid air cells. No fluid levels are present. Left sphenoid sinus mucosal thickening noted. There is some fluid in the right mastoid air cells. No obstructing nasopharyngeal lesion is present. The globes and orbits are within normal limits. MRA HEAD FINDINGS Mild tortuosity is present high cervical segments without significant stenosis. There is no stenosis through the ICA termini. A1 and M1 segments are within normal limits. No definite anterior communicating artery is present. MCA bifurcations are intact. ACA and MCA branch vessels are within normal limits. There is some attenuation due to artifact. Vertebrobasilar junction visualized. Lower vertebral arteries and PICA origins not seen. Left AICA is  dominant. Basilar artery is normal. Both posterior cerebral arteries originate from basilar tip. PCA branch vessels are within normal limits. IMPRESSION: 1. Normal MRI appearance of the brain for age. 2. Mild sinus disease and right mastoid effusion. No obstructing nasopharyngeal lesion is present. 3. Normal MRA Circle of Willis without evidence for significant proximal stenosis, aneurysm, or branch vessel occlusion. Electronically Signed   By: San Morelle M.D.   On: 08/03/2020 18:04   ECHOCARDIOGRAM LIMITED  Result Date: 08/03/2020    ECHOCARDIOGRAM LIMITED REPORT   Patient Name:   JACQUAN VIARS Date of Exam: 08/03/2020 Medical Rec #:  [id number]    Height:       73.0 in Accession #:    [id number]   Weight:       277.3 lb Date of Birth:  1956-02-12   BSA:          2.472 m Patient Age:    82 years     BP:           154/92 mmHg Patient Gender: M            HR:           96 bpm. Exam Location:  Inpatient Procedure: Limited Echo and Intracardiac Opacification Agent Indications:    Rule out  thrombus/ Stroke  History:        Patient has prior history of Echocardiogram examinations, most                 recent 08/01/2020.  Sonographer:    Merrie Roof Referring Phys: [place] Comments: This was a limited echo to rule out thrombus. IMPRESSIONS  1. Limited contrast echo for LV thrombus evaluation. No evidence of LV thrombus. Left ventricular ejection fraction, by estimation, is 20 to 25%. The left ventricle has severely decreased function. The left ventricle demonstrates global hypokinesis. Conclusion(s)/Recommendation(s): No left ventricular mural or apical thrombus/thrombi. FINDINGS  Left Ventricle: Limited contrast echo for LV thrombus evaluation. No evidence of LV thrombus. Left ventricular ejection fraction, by estimation, is 20 to 25%. The left ventricle has severely decreased function. The left ventricle demonstrates global hypokinesis. Definity contrast agent was given IV to delineate the left ventricular endocardial borders. Eleonore Chiquito MD Electronically signed by Eleonore Chiquito MD Signature Date/Time: 08/03/2020/5:16:04 PM    Final                LOS: 5 days   Jaydenn Boccio  [facility]   Pager on [email or web address]. If 7PM-7AM, please contact night-coverage at [email or web address]     08/05/2020, 1:53 PM

## 2020-08-05 NOTE — Progress Notes (Signed)
[place] for IV heparin Indication: chest pain/ACS  Allergies  Allergen Reactions  . Influenza Vaccines Swelling    Reports of swelling of the arm and then sick for 10 days   Patient Measurements: Height: 6\' 1"  (185.4 cm) Weight: 125.8 kg (277 lb 5.4 oz) IBW/kg (Calculated) : 79.9 Heparin Dosing Weight: 117 kg  Vital Signs: Temp: 97.9 F (36.6 C) (05/07 2220) Temp Source: Oral (05/07 2220) BP: 135/82 (05/07 2220) Pulse Rate: 91 (05/07 2220)  Labs: Recent Labs    08/03/20 0327 08/03/20 0327 08/04/20 0437 08/04/20 1323 08/04/20 2113 08/05/20 0338  HGB 11.6*  --  11.3*  --   --  12.3*  HCT 39.4  --  38.2*  --   --  41.3  PLT 162  --  169  --   --  173  HEPARINUNFRC  --    < > <0.10* <0.10* 0.40 0.33  CREATININE 1.19  --  1.29*  --   --   --    < > = values in this interval not displayed.   Estimated Creatinine Clearance: 80.4 mL/min (A) (by C-G formula based on SCr of 1.29 mg/dL (H)).  Medical History: Past Medical History:  Diagnosis Date  . Bipolar II disorder - Managed by Marye Round [phone]) 05/03/2013  . Diabetes mellitus   . Dysphagia   . Hyperlipidemia   . Hypertension   . Unspecified hypothyroidism 05/03/2013    Medications:  Scheduled:  .  stroke: mapping our early stages of recovery book   Does not apply Once  . aspirin  300 mg Rectal Daily  . atorvastatin  20 mg Oral Daily  . carvedilol  6.25 mg Oral BID WC  . gatifloxacin  1 drop Both Eyes QID  . guaiFENesin  600 mg Oral BID  . insulin aspart  0-15 Units Subcutaneous TID WC  . insulin aspart  0-5 Units Subcutaneous QHS  . insulin detemir  10 Units Subcutaneous BID  . lithium carbonate  900 mg Oral Daily  . pantoprazole  40 mg Oral BID AC  . QUEtiapine  400 mg Oral QHS  . sodium chloride flush  3 mL Intravenous Q12H  . thiamine  100 mg Oral Daily   Assessment: 28 yoM with PMH DM2, HLD, HTN, hypothyroid, bipolar II, admitted 5/2 for possible  stroke. CT head on admission negative for bleed. Patient required MRI with sedation which was not available until 5/6. Today Cardiology concerned for ACS and Pharmacy consulted for IV heparin. Did complete MRI prior to starting heparin, which showed no new bleeding or infarct.   Baseline INR, aPTT: not done  Prior anticoagulation: heparin SQ 5000 units TID, last dose 5/6 at 0600  Significant events:  Today, 08/05/2020:  HL 0.33 therapeutic on 2150 units/hr  CBC: Hgb 12.3; Plt stable NWL  No bleeding or infusion issues per nursing  SCr slightly elevated but stable this admission  Goal of Therapy: Heparin level 0.3-0.7 units/ml Monitor platelets by anticoagulation protocol: Yes  Plan:  continue heparin drip at 2150 units/hr  Daily heparin level   Daily CBC  Monitor for signs of bleeding or thrombosis  Dolly Rias RPh 08/05/2020, 5:57 AM \

## 2020-08-05 NOTE — Progress Notes (Signed)
Progress Note  Patient Name: Matthew Barnes Date of Encounter: 08/05/2020  Primary Cardiologist:   Quay Burow, MD   Subjective   He denies pain or SOB.   Able to lie flat back.   Inpatient Medications    Scheduled Meds: .  stroke: mapping our early stages of recovery book   Does not apply Once  . aspirin  300 mg Rectal Daily  . atorvastatin  20 mg Oral Daily  . carvedilol  6.25 mg Oral BID WC  . gatifloxacin  1 drop Both Eyes QID  . guaiFENesin  600 mg Oral BID  . insulin aspart  0-15 Units Subcutaneous TID WC  . insulin aspart  0-5 Units Subcutaneous QHS  . insulin detemir  10 Units Subcutaneous BID  . lithium carbonate  900 mg Oral Daily  . pantoprazole  40 mg Oral BID AC  . QUEtiapine  400 mg Oral QHS  . sodium chloride flush  3 mL Intravenous Q12H  . thiamine  100 mg Oral Daily   Continuous Infusions: . sodium chloride    . heparin 2,150 Units/hr (08/05/20 0141)   PRN Meds: sodium chloride, acetaminophen **OR** acetaminophen, metoprolol tartrate, ondansetron **OR** ondansetron (ZOFRAN) IV, QUEtiapine, sodium chloride flush   Vital Signs    Vitals:   08/04/20 1243 08/04/20 1800 08/04/20 2220 08/05/20 0622  BP: 113/73 (!) 142/101 135/82 120/66  Pulse: 89 96 91 82  Resp: 19  20 18   Temp:   97.9 F (36.6 C) 98.5 F (36.9 C)  TempSrc:   Oral Oral  SpO2: 98% 98% 96% 100%  Weight:      Height:        Intake/Output Summary (Last 24 hours) at 08/05/2020 0757 Last data filed at 08/05/2020 0600 Gross per 24 hour  Intake 394.5 ml  Output --  Net 394.5 ml   Filed Weights   07/30/20 2029 07/31/20 1522  Weight: 132.2 kg 125.8 kg    Telemetry    NSR - Personally Reviewed  ECG    NA - Personally Reviewed  Physical Exam   GEN: No  acute distress.   Neck: No  JVD Cardiac: RRR, no murmurs, rubs, or gallops.  Respiratory:      Decreased breath sounds, bilateral basilar crackes.  GI: Soft, nontender, non-distended, normal bowel sounds  MS:  No edema; No  deformity. Neuro:   Nonfocal  Psych: Oriented and appropriate    Labs    Chemistry Recent Labs  Lab 08/01/20 0344 08/02/20 0358 08/03/20 0327 08/04/20 0437 08/05/20 0338  NA 143   < > 142 145 142  K 3.8   < > 4.0 4.1 4.7  CL 108   < > 107 112* 107  CO2 24   < > 25 24 27   GLUCOSE 114*   < > 116* 134* 99  BUN 14   < > 15 20 19   CREATININE 1.08   < > 1.19 1.29* 1.49*  CALCIUM 9.1   < > 8.7* 9.3 9.0  PROT 6.5  --  6.0* 6.4*  --   ALBUMIN 3.6  --  3.3* 3.4*  --   AST 38  --  33 29  --   ALT 32  --  35 30  --   ALKPHOS 40  --  39 38  --   BILITOT 1.0  --  0.5 0.4  --   GFRNONAA >60   < > >60 >60 52*  ANIONGAP 11   < >  10 9 8    < > = values in this interval not displayed.     Hematology Recent Labs  Lab 08/03/20 0327 08/04/20 0437 08/05/20 0338  WBC 5.2 4.7 5.4  RBC 4.56 4.50 4.85  HGB 11.6* 11.3* 12.3*  HCT 39.4 38.2* 41.3  MCV 86.4 84.9 85.2  MCH 25.4* 25.1* 25.4*  MCHC 29.4* 29.6* 29.8*  RDW 14.1 14.1 14.5  PLT 162 169 173    Cardiac EnzymesNo results for input(s): TROPONINI in the last 168 hours. No results for input(s): TROPIPOC in the last 168 hours.   BNP Recent Labs  Lab 07/31/20 0007  BNP 372.3*     DDimer  Recent Labs  Lab 07/31/20 0836  DDIMER 1.19*     Radiology    MR ANGIO HEAD WO CONTRAST  Result Date: 08/03/2020 CLINICAL DATA:  Abnormal speech. Patient was unable to tolerate additional imaging including coronal images or postcontrast images. EXAM: MRI HEAD WITHOUT CONTRAST MRA HEAD WITHOUT CONTRAST TECHNIQUE: Multiplanar, multiecho pulse sequences of the brain and surrounding structures were obtained without intravenous contrast. Angiographic images of the head were obtained using MRA technique without contrast. COMPARISON:  CT head without contrast 07/30/2020. MR head without and with contrast 07/13/2019 FINDINGS: MRI HEAD FINDINGS Brain: Axial diffusion-weighted images demonstrate no acute or subacute infarction. Patient did not tolerate  coronal diffusion images. Mild atrophy and white matter changes are stable and within normal limits for age. The ventricles are of normal size. No significant extraaxial fluid collection is present. The internal auditory canals are within normal limits. The brainstem and cerebellum are within normal limits. Vascular: Flow is present in the major intracranial arteries. Skull and upper cervical spine: The craniocervical junction is normal. 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 Marrow signal is somewhat depressed diffusely. No discrete lesions present. Sinuses/Orbits: Mild mucosal thickening is present in the right maxillary sinus and scattered throughout the right ethmoid air cells. No fluid levels are present. Left sphenoid sinus mucosal thickening noted. There is some fluid in the right mastoid air cells. No obstructing nasopharyngeal lesion is present. The globes and orbits are within normal limits. MRA HEAD FINDINGS Mild tortuosity is present high cervical segments without significant stenosis. There is no stenosis through the ICA termini. A1 and M1 segments are within normal limits. No definite anterior communicating artery is present. MCA bifurcations are intact. ACA and MCA branch vessels are within normal limits. There is some attenuation due to artifact. Vertebrobasilar junction visualized. Lower vertebral arteries and PICA origins not seen. Left AICA is dominant. Basilar artery is normal. Both posterior cerebral arteries originate from basilar tip. PCA branch vessels are within normal limits. IMPRESSION: 1. Normal MRI appearance of the brain for age. 2. Mild sinus disease and right mastoid effusion. No obstructing nasopharyngeal lesion is present. 3. Normal MRA Circle of Willis without evidence for significant proximal stenosis, aneurysm, or branch vessel occlusion. Electronically Signed   By: San Morelle M.D.   On: 08/03/2020 18:04   MR BRAIN WO CONTRAST  Result Date: 08/03/2020 CLINICAL DATA:  Abnormal  speech. Patient was unable to tolerate additional imaging including coronal images or postcontrast images. EXAM: MRI HEAD WITHOUT CONTRAST MRA HEAD WITHOUT CONTRAST TECHNIQUE: Multiplanar, multiecho pulse sequences of the brain and surrounding structures were obtained without intravenous contrast. Angiographic images of the head were obtained using MRA technique without contrast. COMPARISON:  CT head without contrast 07/30/2020. MR head without and with contrast 07/13/2019 FINDINGS: MRI HEAD FINDINGS Brain: Axial diffusion-weighted images demonstrate no acute  or subacute infarction. Patient did not tolerate coronal diffusion images. Mild atrophy and white matter changes are stable and within normal limits for age. The ventricles are of normal size. No significant extraaxial fluid collection is present. The internal auditory canals are within normal limits. The brainstem and cerebellum are within normal limits. Vascular: Flow is present in the major intracranial arteries. Skull and upper cervical spine: The craniocervical junction is normal. 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 Marrow signal is somewhat depressed diffusely. No discrete lesions present. Sinuses/Orbits: Mild mucosal thickening is present in the right maxillary sinus and scattered throughout the right ethmoid air cells. No fluid levels are present. Left sphenoid sinus mucosal thickening noted. There is some fluid in the right mastoid air cells. No obstructing nasopharyngeal lesion is present. The globes and orbits are within normal limits. MRA HEAD FINDINGS Mild tortuosity is present high cervical segments without significant stenosis. There is no stenosis through the ICA termini. A1 and M1 segments are within normal limits. No definite anterior communicating artery is present. MCA bifurcations are intact. ACA and MCA branch vessels are within normal limits. There is some attenuation due to artifact. Vertebrobasilar junction visualized. Lower vertebral  arteries and PICA origins not seen. Left AICA is dominant. Basilar artery is normal. Both posterior cerebral arteries originate from basilar tip. PCA branch vessels are within normal limits. IMPRESSION: 1. Normal MRI appearance of the brain for age. 2. Mild sinus disease and right mastoid effusion. No obstructing nasopharyngeal lesion is present. 3. Normal MRA Circle of Willis without evidence for significant proximal stenosis, aneurysm, or branch vessel occlusion. Electronically Signed   By: San Morelle M.D.   On: 08/03/2020 18:04   ECHOCARDIOGRAM LIMITED  Result Date: 08/03/2020    ECHOCARDIOGRAM LIMITED REPORT   Patient Name:   Matthew Barnes Date of Exam: 08/03/2020 Medical Rec #:  [id number]    Height:       73.0 in Accession #:    [id number]   Weight:       277.3 lb Date of Birth:  1955/07/23   BSA:          2.472 m Patient Age:    65 years     BP:           154/92 mmHg Patient Gender: M            HR:           96 bpm. Exam Location:  Inpatient Procedure: Limited Echo and Intracardiac Opacification Agent Indications:    Rule out thrombus/ Stroke  History:        Patient has prior history of Echocardiogram examinations, most                 recent 08/01/2020.  Sonographer:    Merrie Roof Referring Phys: [place] Comments: This was a limited echo to rule out thrombus. IMPRESSIONS  1. Limited contrast echo for LV thrombus evaluation. No evidence of LV thrombus. Left ventricular ejection fraction, by estimation, is 20 to 25%. The left ventricle has severely decreased function. The left ventricle demonstrates global hypokinesis. Conclusion(s)/Recommendation(s): No left ventricular mural or apical thrombus/thrombi. FINDINGS  Left Ventricle: Limited contrast echo for LV thrombus evaluation. No evidence of LV thrombus. Left ventricular ejection fraction, by estimation, is 20 to 25%. The left ventricle has severely decreased function. The left ventricle demonstrates global hypokinesis.  Definity contrast agent was given IV to delineate the left ventricular endocardial borders. Eleonore Chiquito MD Electronically  signed by Eleonore Chiquito MD Signature Date/Time: 08/03/2020/5:16:04 PM    Final     Cardiac Studies   ECHO:   1. No subcostal images.  2. Left ventricular ejection fraction, by estimation, is 20 to 25%. The  left ventricle has severely decreased function. The left ventricle  demonstrates global hypokinesis. The left ventricular internal cavity size  was severely dilated. Left ventricular  diastolic parameters are indeterminate.  3. Right ventricular systolic function is normal. The right ventricular  size is normal. There is mildly elevated pulmonary artery systolic  pressure.  4. The pericardial effusion is posterior to the left ventricle.  5. The mitral valve is normal in structure. Mild mitral valve  regurgitation. No evidence of mitral stenosis.  6. The aortic valve is tricuspid. Aortic valve regurgitation is not  visualized. Mild aortic valve sclerosis is present, with no evidence of  aortic valve stenosis.  7. The inferior vena cava is normal in size with greater than 50%  respiratory variability, suggesting right atrial pressure of 3 mmHg.   Patient Profile     65 y.o. male with a hx of hypertension, hyperlipidemia, diabetes mellitus and bipolar disorderwho is being seen for the evaluation of low EFat the request of Dr. Dwyane Dee.  Assessment & Plan    APHASIA:  Acute stroke not evident on CNS imaging.  Improving.   ACUTE ON CHRONIC SYSTOLIC HF:   EF is 20 - 25%.   Possibly an ischemic etiology.  Cath is planned for Monday.    On heparin given reduced EF and possible unstable angina.   Intake and output is not accurate.    AKI:  Creat is up slightly.  He is not getting diuretic or nephrotoxic agent.  Check creat in the AM.  No LV gram on cath.    HTN:  Coreg increased yesterday.  Holding off on Entresto until after cath.    For questions or  updates, please contact [place] Please consult [email or web address] for contact info under Cardiology/STEMI.   Signed, Minus Breeding, MD  08/05/2020, 7:57 AM

## 2020-08-05 NOTE — Plan of Care (Signed)
Patient denies chest pain or shortness of breath on 7 a to 7 p shift.  Patient verbalizes understanding of cardiac cath 5/9, consent signed and understands that he is nothing by mouth after midnight.  Patient ambulatory in hallway this shift without difficulty.

## 2020-08-06 ENCOUNTER — Encounter (HOSPITAL_COMMUNITY)
Admission: EM | Disposition: A | Discharge status: Home Health | Payer: Self-pay | Source: Home / Self Care | Attending: Family Medicine

## 2020-08-06 DIAGNOSIS — I5023 Acute on chronic systolic (congestive) heart failure: Secondary | ICD-10-CM | POA: Diagnosis present

## 2020-08-06 DIAGNOSIS — I420 Dilated cardiomyopathy: Secondary | ICD-10-CM | POA: Diagnosis not present

## 2020-08-06 DIAGNOSIS — I5022 Chronic systolic (congestive) heart failure: Secondary | ICD-10-CM | POA: Diagnosis present

## 2020-08-06 DIAGNOSIS — I5042 Chronic combined systolic (congestive) and diastolic (congestive) heart failure: Secondary | ICD-10-CM | POA: Diagnosis present

## 2020-08-06 LAB — CBC
HCT: 39.4 % (ref 39.0–52.0)
Hemoglobin: 11.6 g/dL — ABNORMAL LOW (ref 13.0–17.0)
MCH: 24.9 pg — ABNORMAL LOW (ref 26.0–34.0)
MCHC: 29.4 g/dL — ABNORMAL LOW (ref 30.0–36.0)
MCV: 84.5 fL (ref 80.0–100.0)
Platelets: 156 10*3/uL (ref 150–400)
RBC: 4.66 MIL/uL (ref 4.22–5.81)
RDW: 14.4 % (ref 11.5–15.5)
WBC: 5.3 10*3/uL (ref 4.0–10.5)
nRBC: 0 % (ref 0.0–0.2)

## 2020-08-06 LAB — BASIC METABOLIC PANEL
Anion gap: 7 (ref 5–15)
BUN: 19 mg/dL (ref 8–23)
CO2: 25 mmol/L (ref 22–32)
Calcium: 8.8 mg/dL — ABNORMAL LOW (ref 8.9–10.3)
Chloride: 107 mmol/L (ref 98–111)
Creatinine, Ser: 1.48 mg/dL — ABNORMAL HIGH (ref 0.61–1.24)
GFR, Estimated: 53 mL/min — ABNORMAL LOW (ref >60)
Glucose, Bld: 100 mg/dL — ABNORMAL HIGH (ref 70–99)
Potassium: 4.7 mmol/L (ref 3.5–5.1)
Sodium: 139 mmol/L (ref 135–145)

## 2020-08-06 LAB — GLUCOSE, CAPILLARY
Glucose-Capillary: 82 mg/dL (ref 70–99)
Glucose-Capillary: 90 mg/dL (ref 70–99)
Glucose-Capillary: 168 mg/dL — ABNORMAL HIGH (ref 70–99)
Glucose-Capillary: 129 mg/dL — ABNORMAL HIGH (ref 70–99)

## 2020-08-06 LAB — HEPARIN LEVEL (UNFRACTIONATED): Heparin Unfractionated: 0.41 IU/mL (ref 0.30–0.70)

## 2020-08-06 LAB — SURGICAL PCR SCREEN
MRSA, PCR: NEGATIVE
Staphylococcus aureus: NEGATIVE

## 2020-08-06 SURGERY — LEFT HEART CATH AND CORONARY ANGIOGRAPHY
Anesthesia: LOCAL

## 2020-08-06 MED ORDER — MUPIROCIN 2 % EX OINT
1.0000 "application " | TOPICAL_OINTMENT | Freq: Two times a day (BID) | CUTANEOUS | Status: DC
  Administered 2020-08-06 – 2020-08-09 (×6): 1 via NASAL
  Filled 2020-08-06 (×2): qty 22

## 2020-08-06 NOTE — Care Management Important Message (Signed)
Medicare IM printed remotely for Social Work team to give to the patient. 

## 2020-08-06 NOTE — Progress Notes (Signed)
Physical Therapy Treatment Patient Details Name: Matthew Barnes MRN: [id number] DOB: May 09, 1955 Today's Date: 08/06/2020    History of Present Illness 66 year old male presents with difficulty finding words and completing sentences. CT head without no acute intracranial abnormality. PMH: bipolar disorder II , diabetes mellitus, hyperlipidemia, hypertension, hypothyroidism.    PT Comments    Pt ambulated in hallway without assistive device and no overt LOB or unsteadiness observed.  Pt hopeful for d/c home soon.   Follow Up Recommendations  Supervision/Assistance - 24 hour     Equipment Recommendations  None recommended by PT    Recommendations for Other Services       Precautions / Restrictions Precautions Precautions: Fall    Mobility  Bed Mobility               General bed mobility comments: pt in recliner    Transfers Overall transfer level: Needs assistance Equipment used: None Transfers: Sit to/from Stand Sit to Stand: Supervision         General transfer comment: supervision for safety  Ambulation/Gait Ambulation/Gait assistance: Supervision Gait Distance (Feet): 260 Feet Assistive device: None Gait Pattern/deviations: Step-through pattern;Decreased stride length;Wide base of support     General Gait Details: no LOB or unsteadiness oberved, pt reports LE fatigue, distance to tolerance   Stairs             Wheelchair Mobility    Modified Rankin (Stroke Patients Only)       Balance                                            Cognition Arousal/Alertness: Awake/alert Behavior During Therapy: WFL for tasks assessed/performed Overall Cognitive Status: No family/caregiver present to determine baseline cognitive functioning                                 General Comments: pt reports wanting to go home, recounts discussion with cardio this morning and his refusal for heart cath, states he is feeling  better, pt's speech clear during session however dysarthric per chart review (dysarthric per speech today, comes and goes??)      Exercises      General Comments        Pertinent Vitals/Pain Pain Assessment: No/denies pain    Home Living                      Prior Function            PT Goals (current goals can now be found in the care plan section) Progress towards PT goals: Progressing toward goals    Frequency    Min 3X/week      PT Plan Current plan remains appropriate    Co-evaluation              AM-PAC PT "6 Clicks" Mobility   Outcome Measure  Help needed turning from your back to your side while in a flat bed without using bedrails?: A Little Help needed moving from lying on your back to sitting on the side of a flat bed without using bedrails?: A Little Help needed moving to and from a bed to a chair (including a wheelchair)?: A Little Help needed standing up from a chair using your arms (e.g., wheelchair or bedside chair)?: A  Little Help needed to walk in hospital room?: A Little Help needed climbing 3-5 steps with a railing? : A Little 6 Click Score: 18    End of Session Equipment Utilized During Treatment: Gait belt Activity Tolerance: Patient tolerated treatment well Patient left: with call bell/phone within reach;in chair;with chair alarm set   PT Visit Diagnosis: Difficulty in walking, not elsewhere classified (R26.2)     Time: 8338-2505 PT Time Calculation (min) (ACUTE ONLY): 9 min  Charges:  $Gait Training: 8-22 mins                     Arlyce Dice, DPT Acute Rehabilitation Services Pager: [phone] Office: [place] 08/06/2020, 4:19 PM

## 2020-08-06 NOTE — Plan of Care (Signed)
  Problem: Health Behavior/Discharge Planning: Goal: Ability to manage health-related needs will improve Outcome: Progressing   Problem: Clinical Measurements: Goal: Ability to maintain clinical measurements within normal limits will improve Outcome: Progressing Goal: Will remain free from infection Outcome: Progressing Goal: Diagnostic test results will improve Outcome: Progressing Goal: Respiratory complications will improve Outcome: Progressing Goal: Cardiovascular complication will be avoided Outcome: Progressing   Problem: Activity: Goal: Risk for activity intolerance will decrease Outcome: Progressing   Problem: Nutrition: Goal: Adequate nutrition will be maintained Outcome: Progressing   Problem: Elimination: Goal: Will not experience complications related to bowel motility Outcome: Progressing Goal: Will not experience complications related to urinary retention Outcome: Progressing   Problem: Pain Managment: Goal: General experience of comfort will improve Outcome: Progressing   Problem: Safety: Goal: Ability to remain free from injury will improve Outcome: Progressing   Problem: Skin Integrity: Goal: Risk for impaired skin integrity will decrease Outcome: Progressing   Problem: Education: Goal: Understanding of CV disease, CV risk reduction, and recovery process will improve Outcome: Progressing Goal: Individualized Educational Video(s) Outcome: Progressing   Problem: Activity: Goal: Ability to return to baseline activity level will improve Outcome: Progressing   Problem: Cardiovascular: Goal: Ability to achieve and maintain adequate cardiovascular perfusion will improve Outcome: Progressing Goal: Vascular access site(s) Level 0-1 will be maintained Outcome: Progressing   Problem: Education: Goal: Knowledge of disease or condition will improve Outcome: Progressing Goal: Knowledge of secondary prevention will improve Outcome: Progressing    Problem: Coping: Goal: Level of anxiety will decrease Outcome: Not Progressing

## 2020-08-06 NOTE — Evaluation (Signed)
Speech Language Pathology Evaluation Patient Details Name: Matthew Barnes MRN: [id number] DOB: [birth year] Today's Date: 08/06/2020 Time: 0950-1010 SLP Time Calculation (min) (ACUTE ONLY): 20 min  Problem List:  Patient Active Problem List   Diagnosis Date Noted  . AMS (altered mental status) 07/31/2020  . Aphasia 07/31/2020  . Difficulty with speech 07/30/2020  . Chest pain 03/26/2018  . Dyspnea on exertion 03/26/2018  . Family history of heart disease 03/26/2018  . Leg edema 10/27/2016  . Bipolar affective, mixed ([place]) 04/29/2016  . Esophageal reflux 01/17/2014  . Type 2 diabetes mellitus with diabetic neuropathy, with long-term current use of insulin ([place]) 08/31/2013  . Hypertension associated with diabetes ([place]) 05/03/2013  . Hyperlipidemia 05/03/2013  . Hypothyroidism 05/03/2013   Past Medical History:  Past Medical History:  Diagnosis Date  . Bipolar II disorder - Managed by Marye Round [id number]) 05/03/2013  . Diabetes mellitus   . Dysphagia   . Hyperlipidemia   . Hypertension   . Unspecified hypothyroidism 05/03/2013   Past Surgical History:  Past Surgical History:  Procedure Laterality Date  . KNEE SURGERY     scope on left knee   HPI:  Patient is a 65 y.o. male with PMH: bipolar disorder II, DM, HLD,  HTN, hypothyroidism, GERD,who presented with difficulty finding words and completing sentences.  CT head and MRI brain were both negative for acute intracranial abnormality. 2D echo showed EF estimated at 20-25% and cardiac cath recommended to r/o ischemia. Patient on heparin drip for possible unstable angina.   Assessment / Plan / Recommendation Clinical Impression  Patient presents with a mild dysarthria and a mild-moderate cognitive-linguistic disorder. MRI brain and CT head both negative for acute intracranial abnormality. Per chart review, there are reports of premorbid cognitive and word finding issues prior to this admission  as well as psych issues  related to his bipolar disorder. Patient's dysarthria resulted in imprecise consonants and more pronounced during connected speech at conversational level. Aside from mild left side weakness of mouth/corner of lips, no other significant oral-motor weakness observed. Cognitively, patient exhibits poor attention, poor awareness to impact of deficits, oriented to self but not completly oriented to situation, oriented to year but stated month as "the 7th month" and date as "23rd". He did report he was at "[facility]" (he is at [facility] which [facility] of [facility]). At end of evaluation, he stood up and started to walk in room and was disregarding IV connected to him. He stated "I need to get back to [place]" however he is currently in [place]. Patient able to recall some details of conversation with cardiologist MD who had spoken with patient just a couple minutes prior, but does not appear to grasp the importance of recommendations related to his heart health. Patient exhibited word finding errors during connected, conversational speech, sometimes substituting words but other times not appearing to be aware of his errors. SLP is concerned regarding patient's ability to live by himself and make appropriate decisions related to his medical care. (he is currently declining cardiac catheterization.)    SLP Assessment  SLP Recommendation/Assessment: All further Speech Lanaguage Pathology  needs can be addressed in the next venue of care SLP Visit Diagnosis: Dysarthria and anarthria (R47.1);Cognitive communication deficit (R41.841)    Follow Up Recommendations  Home health SLP;24 hour supervision/assistance;Outpatient SLP    Frequency and Duration           SLP Evaluation Cognition  Overall Cognitive Status: Difficult to  assess Arousal/Alertness: Awake/alert Orientation Level: Oriented to person;Disoriented to time;Disoriented to situation Attention: Sustained Sustained  Attention: Impaired Sustained Attention Impairment: Verbal basic Memory: Impaired Memory Impairment: Retrieval deficit Awareness: Impaired Awareness Impairment: Emergent impairment Problem Solving: Impaired Problem Solving Impairment: Functional basic;Verbal complex Executive Function: Self Monitoring;Decision Making Decision Making: Impaired Decision Making Impairment: Verbal basic;Functional basic Self Monitoring: Impaired Self Monitoring Impairment: Verbal basic;Functional basic Behaviors: Restless Safety/Judgment: Impaired       Comprehension  Auditory Comprehension Overall Auditory Comprehension: Appears within functional limits for tasks assessed    Expression Expression Primary Mode of Expression: Verbal Verbal Expression Overall Verbal Expression: Impaired Initiation: No impairment Level of Generative/Spontaneous Verbalization: Sentence;Conversation (word finding errors at conversational level) Repetition: No impairment Verbal Errors: Perseveration;Semantic paraphasias;Other (comment) (not consistently aware of errors) Pragmatics: Impairment Impairments: Abnormal affect Interfering Components: Attention;Premorbid deficit (reportedly has h/o word finding errors) Non-Verbal Means of Communication: Not applicable Written Expression Written Expression: Not tested   Oral / Motor  Oral Motor/Sensory Function Overall Oral Motor/Sensory Function: Mild impairment Facial ROM: Reduced left Facial Symmetry: Abnormal symmetry left Lingual Symmetry: Within Functional Limits Lingual Strength: Within Functional Limits Velum: Within Functional Limits Mandible: Within Functional Limits Motor Speech Overall Motor Speech: Impaired Respiration: Within functional limits Phonation: Normal Resonance: Within functional limits Articulation: Impaired Level of Impairment: Phrase Intelligibility: Intelligibility reduced Word: 75-100% accurate Phrase: Other (comment) (intelligiblity  approximately 80-85%) Sentence: Not tested Conversation: Other (comment) (intelligibility approximately 80%) Motor Planning: Witnin functional limits Motor Speech Errors: Not applicable Interfering Components: Premorbid status Effective Techniques: Slow rate   GO                    Sonia Baller, MA, CCC-SLP Speech Therapy

## 2020-08-06 NOTE — Progress Notes (Addendum)
Progress Note    Matthew Barnes  XTK:[id number] DOB: 10/09/1955  DOA: 07/30/2020 PCP: System, Provider Not In      Brief Narrative:    Medical records reviewed and are as summarized below:  Matthew Barnes is a 65 y.o. male       Assessment/Plan:   Principal Problem:   Aphasia Active Problems:   Hypertension associated with diabetes ([place])   Hyperlipidemia   Type 2 diabetes mellitus with diabetic neuropathy, with long-term current use of insulin (H[facility])   Esophageal reflux   Bipolar affective, mixed ([place])   AMS (altered mental status)   Acute on chronic systolic CHF (congestive heart failure) ([place])    Body mass index is 37.62 kg/m.  (Morbid obesity) we will  Aphasia: Improved.  Acute stroke has been ruled out.  No evidence of ischemia/stroke on MRI brain.  Acute on chronic systolic CHF: 2D echo showed EF estimated at 20 to 25%.  Patient refused to have cardiac cath.  IV heparin has been discontinued.    AKI versus CKD stage IIIa: Creatinine stable.  Monitor BMP.  Hypertension: Continue antihypertensives  Insulin-dependent diabetes mellitus: Continue Levemir and NovoLog.  Bipolar disorder, cognitive impairment: Patient was evaluated by the speech therapist today.  There is some concern patient living at home alone by himself because of cognitive impairment.  Follow-up with case manager to assist with disposition.   Plan of care was discussed with his brother, [name].  He was concerned that patient had refused to have cardiac cath procedure.  He said he was going to talk to his brother to see if he can convince him to undergo the procedure.  He said that sometimes his brother does not make the right decisions because he may not be in the right frame of mind.   ADDENDUM  Patient has agreed to undergo cardiac catheterization tomorrow.  Diet Order    None         Consultants: Cardiologist  Procedures:  None    Medications:   .  stroke:  mapping our early stages of recovery book   Does not apply Once  . atorvastatin  20 mg Oral Daily  . carvedilol  6.25 mg Oral BID WC  . gatifloxacin  1 drop Both Eyes QID  . guaiFENesin  600 mg Oral BID  . insulin aspart  0-15 Units Subcutaneous TID WC  . insulin aspart  0-5 Units Subcutaneous QHS  . insulin detemir  10 Units Subcutaneous BID  . lithium carbonate  900 mg Oral Daily  . pantoprazole  40 mg Oral BID AC  . QUEtiapine  400 mg Oral QHS  . sodium chloride flush  3 mL Intravenous Q12H  . thiamine  100 mg Oral Daily   Continuous Infusions: . sodium chloride    . sodium chloride    . sodium chloride       Anti-infectives (From admission, onward)   Start     Dose/Rate Route Frequency Ordered Stop   08/02/20 0630  azithromycin (ZITHROMAX) tablet 500 mg  Status:  Discontinued        500 mg Oral Daily 08/02/20 0539 08/03/20 1500   07/31/20 0400  cefTRIAXone (ROCEPHIN) 1 g in sodium chloride 0.9 % 100 mL IVPB        1 g 200 mL/hr over 30 Minutes Intravenous  Once 07/31/20 0355 07/31/20 0545   07/31/20 0400  azithromycin (ZITHROMAX) 500 mg in sodium chloride 0.9 % 250 mL  IVPB  Status:  Discontinued        500 mg 250 mL/hr over 60 Minutes Intravenous Every 24 hours 07/31/20 0355 08/02/20 0539             Family Communication/Anticipated D/C date and plan/Code Status   DVT prophylaxis: SCDs Start: 07/31/20 0017     Code Status: Full Code  Family Communication: None Disposition Plan:    Status is: Inpatient  Remains inpatient appropriate because:IV treatments appropriate due to intensity of illness or inability to take PO   Dispo: The patient is from: Home              Anticipated d/c is to: Home              Patient currently is not medically stable to d/c.   Difficult to place patient No           Subjective:   Interval events noted.  He has no complaints.  No shortness of breath, chest pain or dizziness.   Objective:    Vitals:    08/05/20 2102 08/06/20 0547 08/06/20 0900 08/06/20 1308  BP: 130/90 119/78 119/83 127/88  Pulse: 96 88 87 87  Resp: 18 20  18   Temp: 98 F (36.7 C) 98.5 F (36.9 C)    TempSrc: Oral     SpO2: 96% 100%  98%  Weight:      Height:       No data found.   Intake/Output Summary (Last 24 hours) at 08/06/2020 1416 Last data filed at 08/06/2020 1042 Gross per 24 hour  Intake 254.09 ml  Output 575 ml  Net -320.91 ml   Filed Weights   07/30/20 2029 07/31/20 1522 08/05/20 1400  Weight: 132.2 kg 125.8 kg 129.3 kg    Exam:  GEN: NAD SKIN: Warm and dry EYES: EOMI ENT: MMM CV: RRR PULM: CTA B ABD: soft, ND, NT, +BS CNS: AAO x 1 (person), non focal EXT: Bilateral leg pitting edema (R >L), no tenderness       Data Reviewed:   I have personally reviewed following labs and imaging studies:  Labs: Labs show the following:   Basic Metabolic Panel: Recent Labs  Lab 08/01/20 0344 08/02/20 0358 08/03/20 0327 08/04/20 0437 08/05/20 0338 08/06/20 0357  NA 143 143 142 145 142 139  K 3.8 3.7 4.0 4.1 4.7 4.7  CL 108 109 107 112* 107 107  CO2 24 24 25 24 27 25   GLUCOSE 114* 136* 116* 134* 99 100*  BUN 14 18 15 20 19 19   CREATININE 1.08 1.38* 1.19 1.29* 1.49* 1.48*  CALCIUM 9.1 8.8* 8.7* 9.3 9.0 8.8*  MG 2.0 2.0 2.0 2.1 2.2  --   PHOS 4.0 4.7* 3.9 4.2 4.2  --    GFR Estimated Creatinine Clearance: 71.1 mL/min (A) (by C-G formula based on SCr of 1.48 mg/dL (H)). Liver Function Tests: Recent Labs  Lab 07/30/20 2101 07/31/20 0643 08/01/20 0344 08/03/20 0327 08/04/20 0437  AST 33 35 38 33 29  ALT 30 33 32 35 30  ALKPHOS 49 50 40 39 38  BILITOT 1.2 1.0 1.0 0.5 0.4  PROT 7.3 7.2 6.5 6.0* 6.4*  ALBUMIN 4.0 4.1 3.6 3.3* 3.4*   No results for input(s): LIPASE, AMYLASE in the last 168 hours. Recent Labs  Lab 07/31/20 0740  AMMONIA 29   Coagulation profile No results for input(s): INR, PROTIME in the last 168 hours.  CBC: Recent Labs  Lab 07/30/20 2101  07/31/20  [id number] 08/02/20 0358 08/03/20 0327 08/04/20 0437 08/05/20 0338 08/06/20 0357  WBC 8.4   < > 5.5 5.2 4.7 5.4 5.3  NEUTROABS 5.5  --   --   --   --   --   --   HGB 12.2*   < > 10.8* 11.6* 11.3* 12.3* 11.6*  HCT 40.3   < > 36.4* 39.4 38.2* 41.3 39.4  MCV 83.1   < > 83.5 86.4 84.9 85.2 84.5  PLT 203   < > 173 162 169 173 156   < > = values in this interval not displayed.   Cardiac Enzymes: Recent Labs  Lab 07/31/20 0127 07/31/20 0643  CKTOTAL 404* 417*   BNP (last 3 results) No results for input(s): PROBNP in the last 8760 hours. CBG: Recent Labs  Lab 08/05/20 1134 08/05/20 1638 08/05/20 2101 08/06/20 0738 08/06/20 1133  GLUCAP 140* 101* 127* 82 90   D-Dimer: No results for input(s): DDIMER in the last 72 hours. Hgb A1c: No results for input(s): HGBA1C in the last 72 hours. Lipid Profile: No results for input(s): CHOL, HDL, LDLCALC, TRIG, CHOLHDL, LDLDIRECT in the last 72 hours. Thyroid function studies: No results for input(s): TSH, T4TOTAL, T3FREE, THYROIDAB in the last 72 hours.  Invalid input(s): FREET3 Anemia work up: No results for input(s): VITAMINB12, FOLATE, FERRITIN, TIBC, IRON, RETICCTPCT in the last 72 hours. Sepsis Labs: Recent Labs  Lab 07/31/20 0127 07/31/20 0409 07/31/20 [id number] 07/31/20 1702 08/01/20 0344 08/03/20 0327 08/04/20 0437 08/05/20 0338 08/06/20 0357  PROCALCITON  --   --   --  <0.10  --   --   --   --   --   WBC  --   --    < >  --    < > 5.2 4.7 5.4 5.3  LATICACIDVEN 1.4 1.4  --   --   --   --   --   --   --    < > = values in this interval not displayed.    Microbiology Recent Results (from the past 240 hour(s))  Culture, Urine     Status: None   Collection Time: 07/30/20  9:00 PM   Specimen: Urine, Random  Result Value Ref Range Status   Specimen Description   Final    URINE, RANDOM Performed at [facility] [address] [place], [place] [address]    Special Requests   Final     NONE Performed at [facility], [place] [address] [place], [place] [address]    Culture   Final    NO GROWTH Performed at [facility], [place] [address], [place], [place] [address]    Report Status 08/01/2020 FINAL  Final  Resp Panel by RT-PCR (Flu A&B, Covid) Nasopharyngeal Swab     Status: None   Collection Time: 07/30/20 11:15 PM   Specimen: Nasopharyngeal Swab; Nasopharyngeal(NP) swabs in vial transport medium  Result Value Ref Range Status   SARS Coronavirus 2 by RT PCR NEGATIVE NEGATIVE Final    Comment: (NOTE) SARS-CoV-2 target nucleic acids are NOT DETECTED.  The SARS-CoV-2 RNA is generally detectable in upper respiratory specimens during the acute phase of infection. The lowest concentration of SARS-CoV-2 viral copies this assay can detect is 138 copies/mL. A negative result does not preclude SARS-Cov-2 infection and should not be used as the sole basis for treatment or other patient management decisions. A negative result may occur with  improper specimen collection/handling, submission  of specimen other than nasopharyngeal swab, presence of viral mutation(s) within the areas targeted by this assay, and inadequate number of viral copies(<138 copies/mL). A negative result must be combined with clinical observations, patient history, and epidemiological information. The expected result is Negative.  Fact Sheet for Patients:  EntrepreneurPulse.com.au  Fact Sheet for Healthcare Providers:  IncredibleEmployment.be  This test is no t yet approved or cleared by the [place] FDA and  has been authorized for detection and/or diagnosis of SARS-CoV-2 by FDA under an Emergency Use Authorization (EUA). This EUA will remain  in effect (meaning this test can be used) for the duration of the COVID-19 declaration under Section 564(b)(1) of the Act, 21 U.S.C.section 360bbb-3(b)(1), unless the authorization is terminated  or  revoked sooner.       Influenza A by PCR NEGATIVE NEGATIVE Final   Influenza B by PCR NEGATIVE NEGATIVE Final    Comment: (NOTE) The Xpert Xpress SARS-CoV-2/FLU/RSV plus assay is intended as an aid in the diagnosis of influenza from Nasopharyngeal swab specimens and should not be used as a sole basis for treatment. Nasal washings and aspirates are unacceptable for Xpert Xpress SARS-CoV-2/FLU/RSV testing.  Fact Sheet for Patients: EntrepreneurPulse.com.au  Fact Sheet for Healthcare Providers: IncredibleEmployment.be  This test is not yet approved or cleared by the [place] FDA and has been authorized for detection and/or diagnosis of SARS-CoV-2 by FDA under an Emergency Use Authorization (EUA). This EUA will remain in effect (meaning this test can be used) for the duration of the COVID-19 declaration under Section 564(b)(1) of the Act, 21 U.S.C. section 360bbb-3(b)(1), unless the authorization is terminated or revoked.  Performed at [facility], [place] [address] [place], [place] [address]   Culture, blood (Routine X 2) w Reflex to ID Panel     Status: None   Collection Time: 07/31/20  1:27 AM   Specimen: BLOOD  Result Value Ref Range Status   Specimen Description   Final    BLOOD LEFT ANTECUBITAL Performed at [facility] [address] [place], [place] [address]    Special Requests   Final    BOTTLES DRAWN AEROBIC AND ANAEROBIC Blood Culture adequate volume Performed at [facility] [address], [place], [place] [address]    Culture   Final    NO GROWTH 5 DAYS Performed at [facility], [place] [address], [place], [place] [address]    Report Status 08/05/2020 FINAL  Final  Culture, blood (Routine X 2) w Reflex to ID Panel     Status: None   Collection Time: 07/31/20  4:09 AM   Specimen: BLOOD  Result Value Ref Range Status   Specimen Description   Final     BLOOD RIGHT ANTECUBITAL Performed at [facility] [address], [place], [place] [address]    Special Requests   Final    BOTTLES DRAWN AEROBIC AND ANAEROBIC Blood Culture results may not be optimal due to an inadequate volume of blood received in culture bottles Performed at [facility] [address] [place], [place] [address]    Culture   Final    NO GROWTH 5 DAYS Performed at [facility], [place] [address], [place], [place] [address]    Report Status 08/05/2020 FINAL  Final    Procedures and diagnostic studies:  No results found.             LOS: 6 days   Montrell Cessna  Shelly Spenser  [employer], advertising on [email or web address]. If 7PM-7AM, please contact night-coverage at [email or web address]     08/06/2020, 2:16 PM

## 2020-08-06 NOTE — Progress Notes (Addendum)
Progress Note  Patient Name: Matthew Barnes Date of Encounter: 08/06/2020  [facility] Cardiologist: Quay Burow, MD   Subjective   Pt states he feels well and can't stay in the hospital any longer. He is no longer having chest pain on heparin and wants to continue this. I explained we can't keep him on IV medication after discharge. I also discussed possible heart attack after discharge. He understands the risk and wants to leave; however, due to residual aphasia, keep NPO until Dr. Stanford Breed can evaluate.  He reports breathing is normal and he was able to sleep flat last night. When asked about edema and voiding, he talked about prostate issues.  Inpatient Medications    Scheduled Meds: .  stroke: mapping our early stages of recovery book   Does not apply Once  . aspirin  81 mg Oral Pre-Cath  . atorvastatin  20 mg Oral Daily  . carvedilol  6.25 mg Oral BID WC  . gatifloxacin  1 drop Both Eyes QID  . guaiFENesin  600 mg Oral BID  . insulin aspart  0-15 Units Subcutaneous TID WC  . insulin aspart  0-5 Units Subcutaneous QHS  . insulin detemir  10 Units Subcutaneous BID  . lithium carbonate  900 mg Oral Daily  . pantoprazole  40 mg Oral BID AC  . QUEtiapine  400 mg Oral QHS  . sodium chloride flush  3 mL Intravenous Q12H  . thiamine  100 mg Oral Daily   Continuous Infusions: . sodium chloride    . sodium chloride    . sodium chloride    . heparin 2,150 Units/hr (08/06/20 0200)   PRN Meds: sodium chloride, sodium chloride, acetaminophen **OR** acetaminophen, metoprolol tartrate, ondansetron **OR** ondansetron (ZOFRAN) IV, QUEtiapine, sodium chloride flush, sodium chloride flush   Vital Signs    Vitals:   08/05/20 1243 08/05/20 1400 08/05/20 2102 08/06/20 0547  BP: 126/83  130/90 119/78  Pulse: 94  96 88  Resp: 20  18 20   Temp: 98.2 F (36.8 C)  98 F (36.7 C) 98.5 F (36.9 C)  TempSrc: Oral  Oral   SpO2: 98%  96% 100%  Weight:  129.3 kg    Height:         Intake/Output Summary (Last 24 hours) at 08/06/2020 0816 Last data filed at 08/06/2020 0200 Gross per 24 hour  Intake 425.8 ml  Output 300 ml  Net 125.8 ml   Last 3 Weights 08/05/2020 07/31/2020 07/30/2020  Weight (lbs) 285 lb 2 oz 277 lb 5.4 oz 291 lb 7.2 oz  Weight (kg) 129.332 kg 125.8 kg 132.2 kg      Telemetry    Sinus rhythm in the 80s - Personally Reviewed  ECG    No new tracings - Personally Reviewed  Physical Exam   GEN: No acute distress.   Neck: No JVD - exam difficult Cardiac: RRR, no murmurs, rubs, or gallops.  Respiratory: crackles in B bases GI: Soft, nontender, non-distended  MS: 1+ B LE edema Neuro:  residual aphasia Psych: Normal affect   Labs    High Sensitivity Troponin:   Recent Labs  Lab 07/31/20 0127 07/31/20 0409 07/31/20 0740  TROPONINIHS 32* 33* 34*      Chemistry Recent Labs  Lab 08/01/20 0344 08/02/20 0358 08/03/20 0327 08/04/20 0437 08/05/20 0338 08/06/20 0357  NA 143   < > 142 145 142 139  K 3.8   < > 4.0 4.1 4.7 4.7  CL 108   < >  107 112* 107 107  CO2 24   < > 25 24 27 25   GLUCOSE 114*   < > 116* 134* 99 100*  BUN 14   < > 15 20 19 19   CREATININE 1.08   < > 1.19 1.29* 1.49* 1.48*  CALCIUM 9.1   < > 8.7* 9.3 9.0 8.8*  PROT 6.5  --  6.0* 6.4*  --   --   ALBUMIN 3.6  --  3.3* 3.4*  --   --   AST 38  --  33 29  --   --   ALT 32  --  35 30  --   --   ALKPHOS 40  --  39 38  --   --   BILITOT 1.0  --  0.5 0.4  --   --   GFRNONAA >60   < > >60 >60 52* 53*  ANIONGAP 11   < > 10 9 8 7    < > = values in this interval not displayed.     Hematology Recent Labs  Lab 08/04/20 0437 08/05/20 0338 08/06/20 0357  WBC 4.7 5.4 5.3  RBC 4.50 4.85 4.66  HGB 11.3* 12.3* 11.6*  HCT 38.2* 41.3 39.4  MCV 84.9 85.2 84.5  MCH 25.1* 25.4* 24.9*  MCHC 29.6* 29.8* 29.4*  RDW 14.1 14.5 14.4  PLT 169 173 156    BNP Recent Labs  Lab 07/31/20 0007  BNP 372.3*     DDimer  Recent Labs  Lab 07/31/20 0836  DDIMER 1.19*      Radiology    No results found.  Cardiac Studies   ECHO 08/03/20   1. No subcostal images.  2. Left ventricular ejection fraction, by estimation, is 20 to 25%. The  left ventricle has severely decreased function. The left ventricle  demonstrates global hypokinesis. The left ventricular internal cavity size  was severely dilated. Left ventricular  diastolic parameters are indeterminate.  3. Right ventricular systolic function is normal. The right ventricular  size is normal. There is mildly elevated pulmonary artery systolic  pressure.  4. The pericardial effusion is posterior to the left ventricle.  5. The mitral valve is normal in structure. Mild mitral valve  regurgitation. No evidence of mitral stenosis.  6. The aortic valve is tricuspid. Aortic valve regurgitation is not  visualized. Mild aortic valve sclerosis is present, with no evidence of  aortic valve stenosis.  7. The inferior vena cava is normal in size with greater than 50%  respiratory variability, suggesting right atrial pressure of 3 mmHg.   Patient Profile     65 y.o. male with a hx of hypertension, hyperlipidemia, diabetes mellitus and bipolar disorderwho is being seen for the evaluation of low EF.  Assessment & Plan    Acute on chronic systolic heart failure - EF 20-25%, possibly ischemic etiology - symptoms concerning for unstable angina --> has been maintained on heparin gtt - planned for cath today, but patient is now refusing since he feels better - I explained that he could have a blocked artery around his heart and may be at risk for heart attack after discharge; I also explained he could not go home with IV heparin - he states he is ready to leave - keep NPO until Dr. Stanford Breed sees - crackles in bases on exam, LE edema - lasix held for AKI and planned angiography - if no heart cath today, will need to stop heparin gtt and challenge with lasix   Aphasia -  acute CVA not seen on imaging -  seems to have residual aphasia, but unclear baseline   Hypertension - coreg increased 2 days ago - was holding off on ARNI until after heart cath - consider starting low dose   AKI - sCr 1.48 (1.49) - stable - will need BMP in 1 week if adding ARNI  For questions or updates, please contact [facility] Please consult [email or web address] for contact info under   Signed, Ledora Bottcher, PA  08/06/2020, 8:16 AM   As above, patient seen and examined.  Patient denies dyspnea or chest pain.  He was scheduled to have cardiac catheterization today to exclude coronary disease as a cause of his cardiomyopathy.  However he states he has been here too long and is not agreeable to proceeding today.  He understands the risk of cardiac catheterization as well as the risk of not proceeding including myocardial infarction and death.  He would likely be agreeable as an outpatient.  We will therefore discontinue IV heparin.  Continue aspirin and carvedilol.  We will consider addition of low-dose ARB or Entresto as an outpatient if his renal function improves and his blood pressure allows.  We will arrange outpatient follow-up with APP in 2 to 4 weeks.  Outpatient cardiac catheterization can be arranged at that time if patient is agreeable. Recheck BMET at that time. Note his MRI did not show acute CVA.  Kirk Ruths

## 2020-08-07 ENCOUNTER — Encounter (HOSPITAL_COMMUNITY): Payer: Self-pay | Admitting: Internal Medicine

## 2020-08-07 ENCOUNTER — Encounter (HOSPITAL_COMMUNITY)
Admission: EM | Disposition: A | Discharge status: Home Health | Payer: Self-pay | Source: Home / Self Care | Attending: Family Medicine

## 2020-08-07 DIAGNOSIS — I420 Dilated cardiomyopathy: Secondary | ICD-10-CM

## 2020-08-07 DIAGNOSIS — I5023 Acute on chronic systolic (congestive) heart failure: Secondary | ICD-10-CM

## 2020-08-07 DIAGNOSIS — R778 Other specified abnormalities of plasma proteins: Secondary | ICD-10-CM

## 2020-08-07 HISTORY — PX: RIGHT/LEFT HEART CATH AND CORONARY ANGIOGRAPHY: CATH118266

## 2020-08-07 LAB — GLUCOSE, CAPILLARY
Glucose-Capillary: 85 mg/dL (ref 70–99)
Glucose-Capillary: 81 mg/dL (ref 70–99)
Glucose-Capillary: 85 mg/dL (ref 70–99)
Glucose-Capillary: 82 mg/dL (ref 70–99)
Glucose-Capillary: 196 mg/dL — ABNORMAL HIGH (ref 70–99)

## 2020-08-07 LAB — BASIC METABOLIC PANEL
Anion gap: 7 (ref 5–15)
BUN: 19 mg/dL (ref 8–23)
CO2: 28 mmol/L (ref 22–32)
Calcium: 9.7 mg/dL (ref 8.9–10.3)
Chloride: 107 mmol/L (ref 98–111)
Creatinine, Ser: 1.3 mg/dL — ABNORMAL HIGH (ref 0.61–1.24)
GFR, Estimated: >60 mL/min (ref >60)
Glucose, Bld: 95 mg/dL (ref 70–99)
Potassium: 4.1 mmol/L (ref 3.5–5.1)
Sodium: 142 mmol/L (ref 135–145)

## 2020-08-07 LAB — POCT I-STAT EG7
Acid-Base Excess: 0 mmol/L (ref 0.0–2.0)
Acid-Base Excess: 0 mmol/L (ref 0.0–2.0)
Acid-Base Excess: 2 mmol/L (ref 0.0–2.0)
Bicarbonate: 26.3 mmol/L (ref 20.0–28.0)
Bicarbonate: 26 mmol/L (ref 20.0–28.0)
Bicarbonate: 28.4 mmol/L — ABNORMAL HIGH (ref 20.0–28.0)
Calcium, Ion: 1.3 mmol/L (ref 1.15–1.40)
Calcium, Ion: 1.26 mmol/L (ref 1.15–1.40)
Calcium, Ion: 1.3 mmol/L (ref 1.15–1.40)
HCT: 36 % — ABNORMAL LOW (ref 39.0–52.0)
HCT: 34 % — ABNORMAL LOW (ref 39.0–52.0)
HCT: 35 % — ABNORMAL LOW (ref 39.0–52.0)
Hemoglobin: 12.2 g/dL — ABNORMAL LOW (ref 13.0–17.0)
Hemoglobin: 11.6 g/dL — ABNORMAL LOW (ref 13.0–17.0)
Hemoglobin: 11.9 g/dL — ABNORMAL LOW (ref 13.0–17.0)
O2 Saturation: 90 %
O2 Saturation: 63 %
O2 Saturation: 64 %
Potassium: 4.5 mmol/L (ref 3.5–5.1)
Potassium: 4.3 mmol/L (ref 3.5–5.1)
Potassium: 4.4 mmol/L (ref 3.5–5.1)
Sodium: 141 mmol/L (ref 135–145)
Sodium: 141 mmol/L (ref 135–145)
Sodium: 141 mmol/L (ref 135–145)
TCO2: 28 mmol/L (ref 22–32)
TCO2: 27 mmol/L (ref 22–32)
TCO2: 30 mmol/L (ref 22–32)
pCO2, Ven: 48.9 mmHg (ref 44.0–60.0)
pCO2, Ven: 45 mmHg (ref 44.0–60.0)
pCO2, Ven: 49.6 mmHg (ref 44.0–60.0)
pH, Ven: 7.338 (ref 7.250–7.430)
pH, Ven: 7.366 (ref 7.250–7.430)
pH, Ven: 7.37 (ref 7.250–7.430)
pO2, Ven: 64 mmHg — ABNORMAL HIGH (ref 32.0–45.0)
pO2, Ven: 34 mmHg (ref 32.0–45.0)
pO2, Ven: 35 mmHg (ref 32.0–45.0)

## 2020-08-07 LAB — CBC
HCT: 45.3 % (ref 39.0–52.0)
Hemoglobin: 13.5 g/dL (ref 13.0–17.0)
MCH: 25 pg — ABNORMAL LOW (ref 26.0–34.0)
MCHC: 29.8 g/dL — ABNORMAL LOW (ref 30.0–36.0)
MCV: 83.7 fL (ref 80.0–100.0)
Platelets: 165 10*3/uL (ref 150–400)
RBC: 5.41 MIL/uL (ref 4.22–5.81)
RDW: 14.4 % (ref 11.5–15.5)
WBC: 5.2 10*3/uL (ref 4.0–10.5)
nRBC: 0 % (ref 0.0–0.2)

## 2020-08-07 LAB — POCT I-STAT 7, (LYTES, BLD GAS, ICA,H+H)
Acid-Base Excess: 0 mmol/L (ref 0.0–2.0)
Bicarbonate: 24.8 mmol/L (ref 20.0–28.0)
Calcium, Ion: 1.24 mmol/L (ref 1.15–1.40)
HCT: 34 % — ABNORMAL LOW (ref 39.0–52.0)
Hemoglobin: 11.6 g/dL — ABNORMAL LOW (ref 13.0–17.0)
O2 Saturation: 98 %
Potassium: 4.3 mmol/L (ref 3.5–5.1)
Sodium: 141 mmol/L (ref 135–145)
TCO2: 26 mmol/L (ref 22–32)
pCO2 arterial: 39.6 mmHg (ref 32.0–48.0)
pH, Arterial: 7.404 (ref 7.350–7.450)
pO2, Arterial: 113 mmHg — ABNORMAL HIGH (ref 83.0–108.0)

## 2020-08-07 LAB — CREATININE, SERUM
Creatinine, Ser: 1.57 mg/dL — ABNORMAL HIGH (ref 0.61–1.24)
GFR, Estimated: 49 mL/min — ABNORMAL LOW (ref >60)

## 2020-08-07 SURGERY — RIGHT/LEFT HEART CATH AND CORONARY ANGIOGRAPHY
Anesthesia: LOCAL

## 2020-08-07 MED ORDER — HEPARIN SODIUM (PORCINE) 1000 UNIT/ML IJ SOLN
INTRAMUSCULAR | Status: AC
  Filled 2020-08-07: qty 1

## 2020-08-07 MED ORDER — SODIUM CHLORIDE 0.9 % IV SOLN: Freq: Once | INTRAVENOUS | Status: DC

## 2020-08-07 MED ORDER — LIDOCAINE HCL (PF) 1 % IJ SOLN
INTRAMUSCULAR | Status: DC | PRN
  Administered 2020-08-07: 2 mL via INTRADERMAL

## 2020-08-07 MED ORDER — HEPARIN SODIUM (PORCINE) 1000 UNIT/ML IJ SOLN
INTRAMUSCULAR | Status: DC | PRN
  Administered 2020-08-07: 6000 [IU] via INTRAVENOUS

## 2020-08-07 MED ORDER — SODIUM CHLORIDE 0.9% FLUSH
3.0000 mL | Freq: Two times a day (BID) | INTRAVENOUS | Status: DC
  Administered 2020-08-07 – 2020-08-09 (×2): 3 mL via INTRAVENOUS

## 2020-08-07 MED ORDER — SODIUM CHLORIDE 0.9% FLUSH: 3.0000 mL | INTRAVENOUS | Status: DC | PRN

## 2020-08-07 MED ORDER — ASPIRIN 81 MG PO CHEW
81.0000 mg | CHEWABLE_TABLET | ORAL | Status: AC
  Administered 2020-08-07: 81 mg via ORAL
  Filled 2020-08-07: qty 1

## 2020-08-07 MED ORDER — VERAPAMIL HCL 2.5 MG/ML IV SOLN
INTRAVENOUS | Status: AC
  Filled 2020-08-07: qty 2

## 2020-08-07 MED ORDER — PRAVASTATIN SODIUM 20 MG PO TABS: 20.0000 mg | ORAL_TABLET | Freq: Every day | ORAL | Status: DC

## 2020-08-07 MED ORDER — FENTANYL CITRATE (PF) 100 MCG/2ML IJ SOLN
INTRAMUSCULAR | Status: DC | PRN
  Administered 2020-08-07: 25 ug via INTRAVENOUS

## 2020-08-07 MED ORDER — SODIUM CHLORIDE 0.9 % IV SOLN: INTRAVENOUS | Status: AC

## 2020-08-07 MED ORDER — MIDAZOLAM HCL 2 MG/2ML IJ SOLN
INTRAMUSCULAR | Status: AC
  Filled 2020-08-07: qty 2

## 2020-08-07 MED ORDER — FUROSEMIDE 10 MG/ML IJ SOLN
INTRAMUSCULAR | Status: AC
  Filled 2020-08-07: qty 4

## 2020-08-07 MED ORDER — FENTANYL CITRATE (PF) 100 MCG/2ML IJ SOLN
INTRAMUSCULAR | Status: AC
  Filled 2020-08-07: qty 2

## 2020-08-07 MED ORDER — VERAPAMIL HCL 2.5 MG/ML IV SOLN
INTRAVENOUS | Status: DC | PRN
  Administered 2020-08-07: 10 mL via INTRA_ARTERIAL

## 2020-08-07 MED ORDER — HEPARIN SODIUM (PORCINE) 5000 UNIT/ML IJ SOLN
5000.0000 [IU] | Freq: Three times a day (TID) | INTRAMUSCULAR | Status: DC
  Administered 2020-08-07 – 2020-08-09 (×4): 5000 [IU] via SUBCUTANEOUS
  Filled 2020-08-07 (×4): qty 1

## 2020-08-07 MED ORDER — HEPARIN (PORCINE) IN NACL 1000-0.9 UT/500ML-% IV SOLN
INTRAVENOUS | Status: AC
  Filled 2020-08-07: qty 1000

## 2020-08-07 MED ORDER — HEPARIN (PORCINE) IN NACL 1000-0.9 UT/500ML-% IV SOLN
INTRAVENOUS | Status: DC | PRN
  Administered 2020-08-07 (×2): 500 mL

## 2020-08-07 MED ORDER — LABETALOL HCL 5 MG/ML IV SOLN: 10.0000 mg | INTRAVENOUS | Status: AC | PRN

## 2020-08-07 MED ORDER — LIDOCAINE HCL (PF) 1 % IJ SOLN
INTRAMUSCULAR | Status: AC
  Filled 2020-08-07: qty 30

## 2020-08-07 MED ORDER — MIDAZOLAM HCL 2 MG/2ML IJ SOLN
INTRAMUSCULAR | Status: DC | PRN
Start: 2020-08-07 — End: 2020-08-07
  Administered 2020-08-07: 1 mg via INTRAVENOUS

## 2020-08-07 MED ORDER — FUROSEMIDE 10 MG/ML IJ SOLN
40.0000 mg | Freq: Once | INTRAMUSCULAR | Status: AC
  Administered 2020-08-07: 40 mg via INTRAVENOUS

## 2020-08-07 MED ORDER — SODIUM CHLORIDE 0.9 % IV SOLN: 250.0000 mL | INTRAVENOUS | Status: DC | PRN

## 2020-08-07 MED ORDER — HYDRALAZINE HCL 20 MG/ML IJ SOLN: 10.0000 mg | INTRAMUSCULAR | Status: AC | PRN

## 2020-08-07 MED ORDER — SODIUM CHLORIDE 0.9 % IV SOLN: INTRAVENOUS | Status: DC

## 2020-08-07 SURGICAL SUPPLY — 12 items
CATH OPTITORQUE TIG 4.0 5F (CATHETERS) ×1 IMPLANT
CATH SWAN GANZ 7F STRAIGHT (CATHETERS) ×1 IMPLANT
DEVICE RAD COMP TR BAND LRG (VASCULAR PRODUCTS) ×1 IMPLANT
GLIDESHEATH SLEND SS 6F .021 (SHEATH) ×1 IMPLANT
GLIDESHEATH SLENDER 7FR .021G (SHEATH) ×1 IMPLANT
GUIDEWIRE INQWIRE 1.5J.035X260 (WIRE) IMPLANT
INQWIRE 1.5J .035X260CM (WIRE) ×2
KIT HEART LEFT (KITS) ×2 IMPLANT
PACK CARDIAC CATHETERIZATION (CUSTOM PROCEDURE TRAY) ×2 IMPLANT
SHEATH PROBE COVER 6X72 (BAG) ×1 IMPLANT
TRANSDUCER W/STOPCOCK (MISCELLANEOUS) ×2 IMPLANT
TUBING CIL FLEX 10 FLL-RA (TUBING) ×2 IMPLANT

## 2020-08-07 NOTE — Progress Notes (Signed)
Occupational Therapy Treatment Patient Details Name: Matthew Barnes MRN: [id number] DOB: [birth year] Today's Date: 08/07/2020    History of present illness 65 year old male presents with difficulty finding words and completing sentences. CT head without no acute intracranial abnormality. PMH: bipolar disorder II , diabetes mellitus, hyperlipidemia, hypertension, hypothyroidism.   OT comments  Patient progressing and has met OT goals.  Pt was able to demonstrate LE dressing, toileting, toilet transfers and standing at sink for grooming all independent to Mod I.  Pt still has some delays in speech, but reports it has grossly improved.  All education completed and pt expressed readiness to return home once medically cleared. Discharge recommendations updated to include no follow up OT.  ?  Follow Up Recommendations  No OT follow up;Supervision - Intermittent    Equipment Recommendations       Recommendations for Other Services      Precautions / Restrictions Precautions Precautions: Fall Restrictions Weight Bearing Restrictions: No       Mobility Bed Mobility           Sit to supine: Modified independent (Device/Increase time)        Transfers     Transfers: Sit to/from Stand Sit to Stand: Modified independent (Device/Increase time)         General transfer comment: Pt negotiating obstacles in room and ambulating to/from bathroom Independently without loss of balance.    Balance Overall balance assessment: Modified Independent                                         ADL either performed or assessed with clinical judgement   ADL Overall ADL's : Modified independent Eating/Feeding: Modified independent   Grooming: Wash/dry hands;Standing;Independent Grooming Details (indicate cue type and reason): Pt able to demo standing at sink for grooming without loss of balance and no c/o fatigue. Upper Body Bathing: Modified independent   Lower Body  Bathing: Modified independent;Sitting/lateral leans;Sit to/from stand   Upper Body Dressing : Modified independent   Lower Body Dressing: Sitting/lateral leans;Sit to/from stand;Modified independent Lower Body Dressing Details (indicate cue type and reason): Increased effort as pt c/o quad tightness, but pt able to demonstrate modified figure 4 positioning with each LE at EOB. Pt ableto doff and don each socks. Pt able to manage clothing in standing Mod I. Toilet Transfer: Modified Independent;Regular Toilet;Grab bars [employer] technical [employer] Details (indicate cue type and reason): Pt used grab bar to demonstrate on/off toilet, but stated that he has a counter top from which to push at home. Toileting- Clothing Manipulation and Hygiene: Modified independent Toileting - Clothing Manipulation Details (indicate cue type and reason): Pt able to simulate UE movements needed for hygiene and pt managed his clothing during toilet transfer.     Functional mobility during ADLs: Modified independent       Vision Baseline Vision/History: Wears glasses Wears Glasses: At all times Vision Assessment?: No apparent visual deficits   Perception     Praxis      Cognition Arousal/Alertness: Awake/alert Behavior During Therapy: WFL for tasks assessed/performed Overall Cognitive Status: No family/caregiver present to determine baseline cognitive functioning                                 General Comments: Word finding difficulty remains at times and pt endorses some difficulty remembering  who has been in and out of his room and who has worked with him before. Pt following mulit-step instructions well, and demonstrates caution and good judgment during functional tasks in room.        Exercises     Shoulder Instructions       General Comments      Pertinent Vitals/ Pain       Pain Assessment: No/denies pain  Home Living                                          Prior  Functioning/Environment              Frequency           Progress Toward Goals  OT Goals(current goals can now be found in the care plan section)  Progress towards OT goals: Goals met/education completed, patient discharged from OT  Acute Rehab OT Goals Patient Stated Goal: To go home today  Plan Discharge plan needs to be updated    Co-evaluation                 AM-PAC OT "6 Clicks" Daily Activity     Outcome Measure   Help from another person eating meals?: None Help from another person taking care of personal grooming?: None Help from another person toileting, which includes using toliet, bedpan, or urinal?: None Help from another person bathing (including washing, rinsing, drying)?: None Help from another person to put on and taking off regular upper body clothing?: None Help from another person to put on and taking off regular lower body clothing?: None 6 Click Score: 24    End of Session    OT Visit Diagnosis: Other symptoms and signs involving cognitive function   Activity Tolerance Patient tolerated treatment well   Patient Left in bed;with call bell/phone within reach;with bed alarm set   Nurse Communication  (OT signing off.)        Time: 9169-4503 OT Time Calculation (min): 16 min  Charges: OT General Charges $OT Visit: 1 Visit OT Treatments $Self Care/Home Management : 8-22 mins  Anderson Malta, OT Acute Rehab Services Office: [phone] 08/07/2020   [name] 08/07/2020, 12:47 PM

## 2020-08-07 NOTE — Progress Notes (Signed)
Progress Note  Patient Name: Matthew Barnes Date of Encounter: 08/07/2020  [facility] Cardiologist: Quay Burow, MD   Subjective   No CP or dyspnea  Inpatient Medications    Scheduled Meds: .  stroke: mapping our early stages of recovery book   Does not apply Once  . atorvastatin  20 mg Oral Daily  . carvedilol  6.25 mg Oral BID WC  . gatifloxacin  1 drop Both Eyes QID  . guaiFENesin  600 mg Oral BID  . insulin aspart  0-15 Units Subcutaneous TID WC  . insulin aspart  0-5 Units Subcutaneous QHS  . insulin detemir  10 Units Subcutaneous BID  . lithium carbonate  900 mg Oral Daily  . mupirocin ointment  1 application Nasal BID  . pantoprazole  40 mg Oral BID AC  . QUEtiapine  400 mg Oral QHS  . sodium chloride flush  3 mL Intravenous Q12H  . thiamine  100 mg Oral Daily   Continuous Infusions: . sodium chloride    . sodium chloride    . sodium chloride     PRN Meds: sodium chloride, sodium chloride, acetaminophen **OR** acetaminophen, ondansetron **OR** ondansetron (ZOFRAN) IV, QUEtiapine, sodium chloride flush, sodium chloride flush   Vital Signs    Vitals:   08/06/20 1308 08/06/20 1717 08/06/20 2120 08/07/20 0441  BP: 127/88 130/89 118/60 106/74  Pulse: 87 88 80 77  Resp: 18  18 18   Temp:   97.8 F (36.6 C) 97.8 F (36.6 C)  TempSrc:   Oral Oral  SpO2: 98%  100% 96%  Weight:      Height:        Intake/Output Summary (Last 24 hours) at 08/07/2020 0920 Last data filed at 08/06/2020 2120 Gross per 24 hour  Intake 240 ml  Output 275 ml  Net -35 ml   Last 3 Weights 08/05/2020 07/31/2020 07/30/2020  Weight (lbs) 285 lb 2 oz 277 lb 5.4 oz 291 lb 7.2 oz  Weight (kg) 129.332 kg 125.8 kg 132.2 kg      Telemetry    Sinus- Personally Reviewed  Physical Exam   GEN: No acute distress.   Neck: No JVD Cardiac: RRR, no murmurs, rubs, or gallops.  Respiratory: Clear to auscultation bilaterally. GI: Soft, nontender, non-distended  MS: trace to 1+ edema Neuro:   Nonfocal  Psych: Normal affect   Labs    High Sensitivity Troponin:   Recent Labs  Lab 07/31/20 0127 07/31/20 0409 07/31/20 0740  TROPONINIHS 32* 33* 34*      Chemistry Recent Labs  Lab 08/01/20 0344 08/02/20 0358 08/03/20 0327 08/04/20 0437 08/05/20 0338 08/06/20 0357 08/07/20 0415  NA 143   < > 142 145 142 139 142  K 3.8   < > 4.0 4.1 4.7 4.7 4.1  CL 108   < > 107 112* 107 107 107  CO2 24   < > 25 24 27 25 28   GLUCOSE 114*   < > 116* 134* 99 100* 95  BUN 14   < > 15 20 19 19 19   CREATININE 1.08   < > 1.19 1.29* 1.49* 1.48* 1.30*  CALCIUM 9.1   < > 8.7* 9.3 9.0 8.8* 9.7  PROT 6.5  --  6.0* 6.4*  --   --   --   ALBUMIN 3.6  --  3.3* 3.4*  --   --   --   AST 38  --  33 29  --   --   --  ALT 32  --  35 30  --   --   --   ALKPHOS 40  --  39 38  --   --   --   BILITOT 1.0  --  0.5 0.4  --   --   --   GFRNONAA >60   < > >60 >60 52* 53* >60  ANIONGAP 11   < > 10 9 8 7 7    < > = values in this interval not displayed.     Hematology Recent Labs  Lab 08/04/20 0437 08/05/20 0338 08/06/20 0357  WBC 4.7 5.4 5.3  RBC 4.50 4.85 4.66  HGB 11.3* 12.3* 11.6*  HCT 38.2* 41.3 39.4  MCV 84.9 85.2 84.5  MCH 25.1* 25.4* 24.9*  MCHC 29.6* 29.8* 29.4*  RDW 14.1 14.5 14.4  PLT 169 173 156    Patient Profile     65 y.o. male with a hx of hypertension, hyperlipidemia, diabetes mellitus and bipolar disorderadmitted with aphasia who is being seen for the evaluation of low EF.  Echocardiogram shows ejection fraction 20 to 25%, severe left ventricular enlargement, mild mitral regurgitation.  Assessment & Plan    1 cardiomyopathy-etiology unclear.  Patient is now agreeable to right and left cardiac catheterization to exclude coronary disease.  This is scheduled for later today.  Risks and benefits including myocardial infarction, CVA and death discussed and he agrees to proceed.  We will continue carvedilol.  Add ARB following procedure once renal function stable.  I do not think  his blood pressure will tolerate Entresto at this time.  Continue aspirin and statin.  2 chronic stage III kidney disease-follow renal function after the procedure.  Limit dye.  3 aphasia-etiology unclear.  No CVA on MRI.  4 hypertension-blood pressure controlled.  5 chronic systolic congestive heart failure-mild pedal edema on examination.  We will add low-dose spironolactone following procedure and likely Jardiance.  6 Bipolar disorder  For questions or updates, please contact [facility] Please consult [email or web address] for contact info under        Signed, Kirk Ruths, MD  08/07/2020, 9:20 AM

## 2020-08-07 NOTE — Progress Notes (Signed)
Called [facility] cath lab to confirm pt's schedule, will do the Cardiac Cath around noon.

## 2020-08-07 NOTE — Interval H&P Note (Signed)
History and Physical Interval Note:  08/07/2020 4:00 PM  [name]  has presented today for surgery, with the diagnosis of DILATED CARDIOMYOPATHY.   The various methods of treatment have been discussed with the patient and family. After consideration of risks, benefits and other options for treatment, the patient has consented to  Procedure(s): RIGHT/LEFT HEART CATH AND CORONARY ANGIOGRAPHY (N/A)  PERCUTANEOUS CORONARY INTERVENTION  as a surgical intervention.  The patient's history has been reviewed, patient examined, no change in status, stable for surgery.  I have reviewed the patient's chart and labs.  Questions were answered to the patient's satisfaction.    Cath Lab Visit (complete for each Cath Lab visit)  Clinical Evaluation Leading to the Procedure:   ACS: No.  Non-ACS:    Anginal Classification: No Symptoms; NYHA CHF III  Anti-ischemic medical therapy: Minimal Therapy (1 class of medications)  Non-Invasive Test Results: High-risk stress test findings: cardiac mortality >3%/year - Severely reduced LVEFF on Echo  Prior CABG: No previous CABG  Glenetta Hew

## 2020-08-07 NOTE — H&P (View-Only) (Signed)
Progress Note  Patient Name: Matthew Barnes Date of Encounter: 08/07/2020  [facility] Cardiologist: Quay Burow, MD   Subjective   No CP or dyspnea  Inpatient Medications    Scheduled Meds: .  stroke: mapping our early stages of recovery book   Does not apply Once  . atorvastatin  20 mg Oral Daily  . carvedilol  6.25 mg Oral BID WC  . gatifloxacin  1 drop Both Eyes QID  . guaiFENesin  600 mg Oral BID  . insulin aspart  0-15 Units Subcutaneous TID WC  . insulin aspart  0-5 Units Subcutaneous QHS  . insulin detemir  10 Units Subcutaneous BID  . lithium carbonate  900 mg Oral Daily  . mupirocin ointment  1 application Nasal BID  . pantoprazole  40 mg Oral BID AC  . QUEtiapine  400 mg Oral QHS  . sodium chloride flush  3 mL Intravenous Q12H  . thiamine  100 mg Oral Daily   Continuous Infusions: . sodium chloride    . sodium chloride    . sodium chloride     PRN Meds: sodium chloride, sodium chloride, acetaminophen **OR** acetaminophen, ondansetron **OR** ondansetron (ZOFRAN) IV, QUEtiapine, sodium chloride flush, sodium chloride flush   Vital Signs    Vitals:   08/06/20 1308 08/06/20 1717 08/06/20 2120 08/07/20 0441  BP: 127/88 130/89 118/60 106/74  Pulse: 87 88 80 77  Resp: 18  18 18   Temp:   97.8 F (36.6 C) 97.8 F (36.6 C)  TempSrc:   Oral Oral  SpO2: 98%  100% 96%  Weight:      Height:        Intake/Output Summary (Last 24 hours) at 08/07/2020 0920 Last data filed at 08/06/2020 2120 Gross per 24 hour  Intake 240 ml  Output 275 ml  Net -35 ml   Last 3 Weights 08/05/2020 07/31/2020 07/30/2020  Weight (lbs) 285 lb 2 oz 277 lb 5.4 oz 291 lb 7.2 oz  Weight (kg) 129.332 kg 125.8 kg 132.2 kg      Telemetry    Sinus- Personally Reviewed  Physical Exam   GEN: No acute distress.   Neck: No JVD Cardiac: RRR, no murmurs, rubs, or gallops.  Respiratory: Clear to auscultation bilaterally. GI: Soft, nontender, non-distended  MS: trace to 1+ edema Neuro:   Nonfocal  Psych: Normal affect   Labs    High Sensitivity Troponin:   Recent Labs  Lab 07/31/20 0127 07/31/20 0409 07/31/20 0740  TROPONINIHS 32* 33* 34*      Chemistry Recent Labs  Lab 08/01/20 0344 08/02/20 0358 08/03/20 0327 08/04/20 0437 08/05/20 0338 08/06/20 0357 08/07/20 0415  NA 143   < > 142 145 142 139 142  K 3.8   < > 4.0 4.1 4.7 4.7 4.1  CL 108   < > 107 112* 107 107 107  CO2 24   < > 25 24 27 25 28   GLUCOSE 114*   < > 116* 134* 99 100* 95  BUN 14   < > 15 20 19 19 19   CREATININE 1.08   < > 1.19 1.29* 1.49* 1.48* 1.30*  CALCIUM 9.1   < > 8.7* 9.3 9.0 8.8* 9.7  PROT 6.5  --  6.0* 6.4*  --   --   --   ALBUMIN 3.6  --  3.3* 3.4*  --   --   --   AST 38  --  33 29  --   --   --  ALT 32  --  35 30  --   --   --   ALKPHOS 40  --  39 38  --   --   --   BILITOT 1.0  --  0.5 0.4  --   --   --   GFRNONAA >60   < > >60 >60 52* 53* >60  ANIONGAP 11   < > 10 9 8 7 7    < > = values in this interval not displayed.     Hematology Recent Labs  Lab 08/04/20 0437 08/05/20 0338 08/06/20 0357  WBC 4.7 5.4 5.3  RBC 4.50 4.85 4.66  HGB 11.3* 12.3* 11.6*  HCT 38.2* 41.3 39.4  MCV 84.9 85.2 84.5  MCH 25.1* 25.4* 24.9*  MCHC 29.6* 29.8* 29.4*  RDW 14.1 14.5 14.4  PLT 169 173 156    Patient Profile     65 y.o. male with a hx of hypertension, hyperlipidemia, diabetes mellitus and bipolar disorderadmitted with aphasia who is being seen for the evaluation of low EF.  Echocardiogram shows ejection fraction 20 to 25%, severe left ventricular enlargement, mild mitral regurgitation.  Assessment & Plan    1 cardiomyopathy-etiology unclear.  Patient is now agreeable to right and left cardiac catheterization to exclude coronary disease.  This is scheduled for later today.  Risks and benefits including myocardial infarction, CVA and death discussed and he agrees to proceed.  We will continue carvedilol.  Add ARB following procedure once renal function stable.  I do not think  his blood pressure will tolerate Entresto at this time.  Continue aspirin and statin.  2 chronic stage III kidney disease-follow renal function after the procedure.  Limit dye.  3 aphasia-etiology unclear.  No CVA on MRI.  4 hypertension-blood pressure controlled.  5 chronic systolic congestive heart failure-mild pedal edema on examination.  We will add low-dose spironolactone following procedure and likely Jardiance.  6 Bipolar disorder  For questions or updates, please contact [facility] Please consult [email or web address] for contact info under        Signed, Kirk Ruths, MD  08/07/2020, 9:20 AM

## 2020-08-07 NOTE — Progress Notes (Addendum)
Progress Note    KEVAUN BONEWITZ  [id number] DOB: [birth year]  DOA: 07/30/2020 PCP: System, Provider Not In      Brief Narrative:    Medical records reviewed and are as summarized below:  LAMAR SEPPI is a 65 y.o. male with PMH significant for bipolar disorder II , diabetes mellitus, probable CKD stage 3a, hyperlipidemia, hypertension, hypothyroidism presented in the ED with difficulty finding words and completing sentences.  Neuroimaging including MRI ruled out acute stroke.  He was found to have acute on chronic systolic CHF.  He was treated with IV heparin infusion for possible unstable angina.  He was seen in consultation by the cardiologist.  He was transferred to [facility] for right and left heart catheterization.      Assessment/Plan:   Principal Problem:   Aphasia Active Problems:   Hypertension associated with diabetes ([place])   Hyperlipidemia   Type 2 diabetes mellitus with diabetic neuropathy, with long-term current use of insulin (HCC)   Esophageal reflux   Bipolar affective, mixed (HCC)   AMS (altered mental status)   Acute on chronic systolic CHF (congestive heart failure) (HCC)    Body mass index is 37.62 kg/m.  (Morbid obesity) we will  Aphasia: Improved.  Acute stroke has been ruled out.  No evidence of ischemia/stroke on MRI brain.  Acute on chronic systolic CHF: 2D echo showed EF estimated at 20 to 25%.  Patient has agreed to have left heart cath..  Procedure has been scheduled for this afternoon.  AKI versus CKD stage IIIa: Creatinine stable.  Repeat BMP tomorrow after left heart cath.  Hypertension: Continue antihypertensives  Insulin-dependent diabetes mellitus: Continue Levemir and NovoLog.  Bipolar disorder, cognitive impairment: Patient was evaluated by the speech therapist. There is some concern about patient living at home alone by himself because of cognitive impairment.  Follow-up with case manager to assist with  disposition.    Diet Order            Diet NPO time specified  Diet effective midnight                    Consultants: Cardiologist  Procedures:  None    Medications:   .  stroke: mapping our early stages of recovery book   Does not apply Once  . atorvastatin  20 mg Oral Daily  . carvedilol  6.25 mg Oral BID WC  . gatifloxacin  1 drop Both Eyes QID  . guaiFENesin  600 mg Oral BID  . insulin aspart  0-15 Units Subcutaneous TID WC  . insulin aspart  0-5 Units Subcutaneous QHS  . insulin detemir  10 Units Subcutaneous BID  . lithium carbonate  900 mg Oral Daily  . mupirocin ointment  1 application Nasal BID  . pantoprazole  40 mg Oral BID AC  . QUEtiapine  400 mg Oral QHS  . sodium chloride flush  3 mL Intravenous Q12H  . thiamine  100 mg Oral Daily   Continuous Infusions: . sodium chloride    . sodium chloride    . sodium chloride    . sodium chloride       Anti-infectives (From admission, onward)   Start     Dose/Rate Route Frequency Ordered Stop   08/02/20 0630  azithromycin (ZITHROMAX) tablet 500 mg  Status:  Discontinued        500 mg Oral Daily 08/02/20 0539 08/03/20 1500   07/31/20 0400  cefTRIAXone (ROCEPHIN)  1 g in sodium chloride 0.9 % 100 mL IVPB        1 g 200 mL/hr over 30 Minutes Intravenous  Once 07/31/20 0355 07/31/20 0545   07/31/20 0400  azithromycin (ZITHROMAX) 500 mg in sodium chloride 0.9 % 250 mL IVPB  Status:  Discontinued        500 mg 250 mL/hr over 60 Minutes Intravenous Every 24 hours 07/31/20 0355 08/02/20 0539             Family Communication/Anticipated D/C date and plan/Code Status   DVT prophylaxis: SCDs Start: 07/31/20 0017     Code Status: Full Code  Family Communication: None Disposition Plan:    Status is: Inpatient  Remains inpatient appropriate because:IV treatments appropriate due to intensity of illness or inability to take PO   Dispo: The patient is from: Home              Anticipated d/c is  to: Home              Patient currently is not medically stable to d/c.   Difficult to place patient No           Subjective:   Interval events noted.  No chest pain or shortness of breath.  He is frustrated that he has to wait till this afternoon for left heart cath.   Objective:    Vitals:   08/06/20 1308 08/06/20 1717 08/06/20 2120 08/07/20 0441  BP: 127/88 130/89 118/60 106/74  Pulse: 87 88 80 77  Resp: 18  18 18   Temp:   97.8 F (36.6 C) 97.8 F (36.6 C)  TempSrc:   Oral Oral  SpO2: 98%  100% 96%  Weight:      Height:       No data found.   Intake/Output Summary (Last 24 hours) at 08/07/2020 1209 Last data filed at 08/06/2020 2120 Gross per 24 hour  Intake 240 ml  Output --  Net 240 ml   Filed Weights   07/30/20 2029 07/31/20 1522 08/05/20 1400  Weight: 132.2 kg 125.8 kg 129.3 kg    Exam:  GEN: NAD SKIN: No rash EYES: EOMI ENT: MMM CV: RRR PULM: CTA B ABD: soft, ND, NT, +BS CNS: AAO x 2 (personand place), non focal EXT: B/l leg edema, no tenderness    Data Reviewed:   I have personally reviewed following labs and imaging studies:  Labs: Labs show the following:   Basic Metabolic Panel: Recent Labs  Lab 08/01/20 0344 08/02/20 0358 08/03/20 0327 08/04/20 0437 08/05/20 0338 08/06/20 0357 08/07/20 0415  NA 143 143 142 145 142 139 142  K 3.8 3.7 4.0 4.1 4.7 4.7 4.1  CL 108 109 107 112* 107 107 107  CO2 24 24 25 24 27 25 28   GLUCOSE 114* 136* 116* 134* 99 100* 95  BUN 14 18 15 20 19 19 19   CREATININE 1.08 1.38* 1.19 1.29* 1.49* 1.48* 1.30*  CALCIUM 9.1 8.8* 8.7* 9.3 9.0 8.8* 9.7  MG 2.0 2.0 2.0 2.1 2.2  --   --   PHOS 4.0 4.7* 3.9 4.2 4.2  --   --    GFR Estimated Creatinine Clearance: 81 mL/min (A) (by C-G formula based on SCr of 1.3 mg/dL (H)). Liver Function Tests: Recent Labs  Lab 08/01/20 0344 08/03/20 0327 08/04/20 0437  AST 38 33 29  ALT 32 35 30  ALKPHOS 40 39 38  BILITOT 1.0 0.5 0.4  PROT 6.5 6.0* 6.4*  ALBUMIN 3.6 3.3* 3.4*   No results for input(s): LIPASE, AMYLASE in the last 168 hours. No results for input(s): AMMONIA in the last 168 hours. Coagulation profile No results for input(s): INR, PROTIME in the last 168 hours.  CBC: Recent Labs  Lab 08/02/20 0358 08/03/20 0327 08/04/20 0437 08/05/20 0338 08/06/20 0357  WBC 5.5 5.2 4.7 5.4 5.3  HGB 10.8* 11.6* 11.3* 12.3* 11.6*  HCT 36.4* 39.4 38.2* 41.3 39.4  MCV 83.5 86.4 84.9 85.2 84.5  PLT 173 162 169 173 156   Cardiac Enzymes: No results for input(s): CKTOTAL, CKMB, CKMBINDEX, TROPONINI in the last 168 hours. BNP (last 3 results) No results for input(s): PROBNP in the last 8760 hours. CBG: Recent Labs  Lab 08/06/20 1133 08/06/20 1643 08/06/20 2116 08/07/20 0723 08/07/20 1115  GLUCAP 90 168* 129* 85 81   D-Dimer: No results for input(s): DDIMER in the last 72 hours. Hgb A1c: No results for input(s): HGBA1C in the last 72 hours. Lipid Profile: No results for input(s): CHOL, HDL, LDLCALC, TRIG, CHOLHDL, LDLDIRECT in the last 72 hours. Thyroid function studies: No results for input(s): TSH, T4TOTAL, T3FREE, THYROIDAB in the last 72 hours.  Invalid input(s): FREET3 Anemia work up: No results for input(s): VITAMINB12, FOLATE, FERRITIN, TIBC, IRON, RETICCTPCT in the last 72 hours. Sepsis Labs: Recent Labs  Lab 07/31/20 1702 08/01/20 0344 08/03/20 0327 08/04/20 0437 08/05/20 0338 08/06/20 0357  PROCALCITON <0.10  --   --   --   --   --   WBC  --    < > 5.2 4.7 5.4 5.3   < > = values in this interval not displayed.    Microbiology Recent Results (from the past 240 hour(s))  Culture, Urine     Status: None   Collection Time: 07/30/20  9:00 PM   Specimen: Urine, Random  Result Value Ref Range Status   Specimen Description   Final    URINE, RANDOM Performed at [facility] [address] [place], [place] [address]    Special Requests   Final    NONE Performed at [facility], [place] [address] [place], [place] [address]    Culture   Final    NO GROWTH Performed at [facility], [place] [address], [place], [place] [address]    Report Status 08/01/2020 FINAL  Final  Resp Panel by RT-PCR (Flu A&B, Covid) Nasopharyngeal Swab     Status: None   Collection Time: 07/30/20 11:15 PM   Specimen: Nasopharyngeal Swab; Nasopharyngeal(NP) swabs in vial transport medium  Result Value Ref Range Status   SARS Coronavirus 2 by RT PCR NEGATIVE NEGATIVE Final    Comment: (NOTE) SARS-CoV-2 target nucleic acids are NOT DETECTED.  The SARS-CoV-2 RNA is generally detectable in upper respiratory specimens during the acute phase of infection. The lowest concentration of SARS-CoV-2 viral copies this assay can detect is 138 copies/mL. A negative result does not preclude SARS-Cov-2 infection and should not be used as the sole basis for treatment or other patient management decisions. A negative result may occur with  improper specimen collection/handling, submission of specimen other than nasopharyngeal swab, presence of viral mutation(s) within the areas targeted by this assay, and inadequate number of viral copies(<138 copies/mL). A negative result must be combined with clinical observations, patient history, and epidemiological information. The expected result is Negative.  Fact Sheet for Patients:  EntrepreneurPulse.com.au  Fact Sheet for Healthcare Providers:  IncredibleEmployment.be  This test is no  t yet approved or cleared by the [place] and  has been authorized for detection and/or diagnosis of SARS-CoV-2 by FDA under an Emergency Use Authorization (EUA). This EUA will remain  in effect (meaning this test can be used) for the duration of the COVID-19 declaration under Section 564(b)(1) of the Act, 21 U.S.C.section 360bbb-3(b)(1), unless the authorization is terminated  or revoked sooner.        Influenza A by PCR NEGATIVE NEGATIVE Final   Influenza B by PCR NEGATIVE NEGATIVE Final    Comment: (NOTE) The Xpert Xpress SARS-CoV-2/FLU/RSV plus assay is intended as an aid in the diagnosis of influenza from Nasopharyngeal swab specimens and should not be used as a sole basis for treatment. Nasal washings and aspirates are unacceptable for Xpert Xpress SARS-CoV-2/FLU/RSV testing.  Fact Sheet for Patients: EntrepreneurPulse.com.au  Fact Sheet for Healthcare Providers: IncredibleEmployment.be  This test is not yet approved or cleared by the [place] FDA and has been authorized for detection and/or diagnosis of SARS-CoV-2 by FDA under an Emergency Use Authorization (EUA). This EUA will remain in effect (meaning this test can be used) for the duration of the COVID-19 declaration under Section 564(b)(1) of the Act, 21 U.S.C. section 360bbb-3(b)(1), unless the authorization is terminated or revoked.  Performed at [facility], [place] [address] [place], [place] [address]   Culture, blood (Routine X 2) w Reflex to ID Panel     Status: None   Collection Time: 07/31/20  1:27 AM   Specimen: BLOOD  Result Value Ref Range Status   Specimen Description   Final    BLOOD LEFT ANTECUBITAL Performed at [facility] [address] [place], [place] [address]    Special Requests   Final    BOTTLES DRAWN AEROBIC AND ANAEROBIC Blood Culture adequate volume Performed at [facility] [address], [place], [place] [address]    Culture   Final    NO GROWTH 5 DAYS Performed at [facility], [place] [address], [place], [place] [address]    Report Status 08/05/2020 FINAL  Final  Culture, blood (Routine X 2) w Reflex to ID Panel     Status: None   Collection Time: 07/31/20  4:09 AM   Specimen: BLOOD  Result Value Ref Range Status   Specimen Description   Final    BLOOD RIGHT  ANTECUBITAL Performed at [facility] [address], [place], [place] [address]    Special Requests   Final    BOTTLES DRAWN AEROBIC AND ANAEROBIC Blood Culture results may not be optimal due to an inadequate volume of blood received in culture bottles Performed at [facility] [address], [place], [place] [address]    Culture   Final    NO GROWTH 5 DAYS Performed at [facility], [place] [address], [place],  [address]    Report Status 08/05/2020 FINAL  Final  Surgical PCR screen     Status: None   Collection Time: 08/06/20  8:21 PM   Specimen: Nasal Mucosa; Nasal Swab  Result Value Ref Range Status   MRSA, PCR NEGATIVE NEGATIVE Final   Staphylococcus aureus NEGATIVE NEGATIVE Final    Comment: (NOTE) The Xpert SA Assay (FDA approved for NASAL specimens in patients 87 years of age and older), is one component of a comprehensive surveillance program. It is not intended to diagnose infection nor to guide or monitor treatment. Performed at [facility], [place] [address], [place], [place] [address]     Procedures and diagnostic studies:  No results found.             LOS: 7 days   Besse Miron  [facility]   Pager on [email or web address]. If 7PM-7AM, please contact night-coverage at [email or web address]     08/07/2020, 12:09 PM

## 2020-08-08 ENCOUNTER — Encounter (HOSPITAL_COMMUNITY): Payer: Self-pay | Admitting: Cardiology

## 2020-08-08 DIAGNOSIS — I5023 Acute on chronic systolic (congestive) heart failure: Secondary | ICD-10-CM | POA: Diagnosis not present

## 2020-08-08 LAB — BASIC METABOLIC PANEL
Anion gap: 9 (ref 5–15)
BUN: 19 mg/dL (ref 8–23)
CO2: 32 mmol/L (ref 22–32)
Calcium: 9.8 mg/dL (ref 8.9–10.3)
Chloride: 99 mmol/L (ref 98–111)
Creatinine, Ser: 1.45 mg/dL — ABNORMAL HIGH (ref 0.61–1.24)
GFR, Estimated: 54 mL/min — ABNORMAL LOW (ref >60)
Glucose, Bld: 115 mg/dL — ABNORMAL HIGH (ref 70–99)
Potassium: 4.1 mmol/L (ref 3.5–5.1)
Sodium: 140 mmol/L (ref 135–145)

## 2020-08-08 LAB — GLUCOSE, CAPILLARY
Glucose-Capillary: 105 mg/dL — ABNORMAL HIGH (ref 70–99)
Glucose-Capillary: 104 mg/dL — ABNORMAL HIGH (ref 70–99)
Glucose-Capillary: 156 mg/dL — ABNORMAL HIGH (ref 70–99)
Glucose-Capillary: 146 mg/dL — ABNORMAL HIGH (ref 70–99)

## 2020-08-08 MED ORDER — DIGOXIN 125 MCG PO TABS
0.1250 mg | ORAL_TABLET | Freq: Every day | ORAL | Status: DC
  Administered 2020-08-08 – 2020-08-09 (×2): 0.125 mg via ORAL
  Filled 2020-08-08 (×2): qty 1

## 2020-08-08 MED ORDER — DIGOXIN 125 MCG PO TABS: 0.1250 mg | ORAL_TABLET | Freq: Every day | ORAL | 0 refills | Status: DC

## 2020-08-08 MED ORDER — LOSARTAN POTASSIUM 25 MG PO TABS: 25.0000 mg | ORAL_TABLET | Freq: Every day | ORAL | 0 refills | Status: DC

## 2020-08-08 MED ORDER — FUROSEMIDE 40 MG PO TABS: 40.0000 mg | ORAL_TABLET | Freq: Every day | ORAL | 0 refills | Status: DC

## 2020-08-08 MED ORDER — ATORVASTATIN CALCIUM 20 MG PO TABS: 20.0000 mg | ORAL_TABLET | Freq: Every day | ORAL | 0 refills | Status: DC

## 2020-08-08 MED ORDER — LOSARTAN POTASSIUM 25 MG PO TABS
25.0000 mg | ORAL_TABLET | Freq: Every day | ORAL | Status: DC
  Administered 2020-08-08 – 2020-08-09 (×2): 25 mg via ORAL
  Filled 2020-08-08 (×2): qty 1

## 2020-08-08 MED ORDER — CARVEDILOL 6.25 MG PO TABS: 6.2500 mg | ORAL_TABLET | Freq: Two times a day (BID) | ORAL | 0 refills | Status: DC

## 2020-08-08 MED ORDER — FUROSEMIDE 40 MG PO TABS
40.0000 mg | ORAL_TABLET | Freq: Every day | ORAL | Status: DC
  Administered 2020-08-08 – 2020-08-09 (×2): 40 mg via ORAL
  Filled 2020-08-08 (×2): qty 1

## 2020-08-08 MED ORDER — THIAMINE HCL 100 MG PO TABS: 100.0000 mg | ORAL_TABLET | Freq: Every day | ORAL | 0 refills | Status: DC

## 2020-08-08 NOTE — Progress Notes (Signed)
Progress Note    Matthew Barnes  [id number] DOB: 08/01/1955  DOA: 07/30/2020 PCP: Caren Macadam, MD      Brief Narrative:    Medical records reviewed and are as summarized below:  Matthew Barnes is a 65 y.o. male with PMH significant for bipolar disorder II , diabetes mellitus, probable CKD stage 3a, hyperlipidemia, hypertension, hypothyroidism presented in the ED with difficulty finding words and completing sentences.  Neuroimaging including MRI ruled out acute stroke.  He was found to have acute on chronic systolic CHF.  He was treated with IV heparin infusion for possible unstable angina.  He was seen in consultation by the cardiologist.  He was transferred to [facility] for right and left heart catheterization.      Assessment/Plan:   Principal Problem:   Aphasia Active Problems:   Hypertension associated with diabetes ([place])   Hyperlipidemia   Type 2 diabetes mellitus with diabetic neuropathy, with long-term current use of insulin (HCC)   Esophageal reflux   Bipolar affective, mixed (HCC)   AMS (altered mental status)   Acute on chronic systolic CHF (congestive heart failure) (HCC)   Dilated cardiomyopathy (HCC)   Elevated troponin level not due to acute coronary syndrome  Aphasia: Improved. Acute stroke has been ruled out. No evidence of ischemia/stroke on MRI brain.  Acute on chronic systolic CHF: 2D echo showed EF estimated at 20 to 25%. Underwent  left heart cath on 5/10 with normal coronary. He received Iv lasix post procedure, cardiology cleared him to discharge home on  carvedilol , losartan 25 mg daily, digoxin 0.125 mg daily and Lasix 40 mg daily. Close follow up with cardiology  For further meds adjustment  ( jardiance, entresot, spironolactone to be determined at follow up)  AKI versus CKD stage IIIa: Creatinine stable.Repeat BMP stable  after left heart cath.  Hypertension: stable on current  regimen  Insulin-dependent diabetes mellitus: Continue Levemir and NovoLog.  Bipolar disorder, cognitive impairment: Patient was evaluated by the speech therapist.There is some concern aboutpatient living at home alone by himself because of cognitive impairment. Follow-up with case manager to assist with disposition  Discharge cancelled  Due to safety concerns, case management consulted   Body mass index is 37.62 kg/m.  (Morbid obesity) we will    Diet Order            Diet - low sodium heart healthy           Diet Heart Room service appropriate? Yes; Fluid consistency: Thin  Diet effective now                    Consultants: Cardiologist  Procedures:  Cardiac cath on 5/10    Medications:   .  stroke: mapping our early stages of recovery book   Does not apply Once  . atorvastatin  20 mg Oral Daily  . carvedilol  6.25 mg Oral BID WC  . digoxin  0.125 mg Oral Daily  . furosemide  40 mg Oral Daily  . gatifloxacin  1 drop Both Eyes QID  . guaiFENesin  600 mg Oral BID  . heparin  5,000 Units Subcutaneous Q8H  . insulin aspart  0-15 Units Subcutaneous TID WC  . insulin aspart  0-5 Units Subcutaneous QHS  . insulin detemir  10 Units Subcutaneous BID  . lithium carbonate  900 mg Oral Daily  . losartan  25 mg Oral Daily  . mupirocin ointment  1  application Nasal BID  . pantoprazole  40 mg Oral BID AC  . QUEtiapine  400 mg Oral QHS  . sodium chloride flush  3 mL Intravenous Q12H  . sodium chloride flush  3 mL Intravenous Q12H  . thiamine  100 mg Oral Daily   Continuous Infusions: . sodium chloride    . sodium chloride       Anti-infectives (From admission, onward)   Start     Dose/Rate Route Frequency Ordered Stop   08/02/20 0630  azithromycin (ZITHROMAX) tablet 500 mg  Status:  Discontinued        500 mg Oral Daily 08/02/20 0539 08/03/20 1500   07/31/20 0400  cefTRIAXone (ROCEPHIN) 1 g in sodium chloride 0.9 % 100 mL IVPB        1 g 200 mL/hr over 30  Minutes Intravenous  Once 07/31/20 0355 07/31/20 0545   07/31/20 0400  azithromycin (ZITHROMAX) 500 mg in sodium chloride 0.9 % 250 mL IVPB  Status:  Discontinued        500 mg 250 mL/hr over 60 Minutes Intravenous Every 24 hours 07/31/20 0355 08/02/20 0539             Family Communication/Anticipated D/C date and plan/Code Status   DVT prophylaxis: heparin injection 5,000 Units Start: 08/07/20 2200 SCDs Start: 07/31/20 0017     Code Status: Full Code  Family Communication: None Disposition Plan:    Status is: Inpatient  Remains inpatient appropriate because:IV treatments appropriate due to intensity of illness or inability to take PO   Dispo: The patient is from: Home              Anticipated d/c is to: TBD              possible home tomorrow with home health   Difficult to place patient No           Subjective:   Reports feeling better, wants to go home   No chest pain or shortness of breath.    Objective:    Vitals:   08/07/20 1930 08/08/20 0131 08/08/20 0538 08/08/20 1331  BP: (!) 146/73 (!) 154/92 120/83 117/74  Pulse: 93 89 88 84  Resp: (!) 24 17 17 16   Temp:  97.8 F (36.6 C) 98 F (36.7 C) 97.9 F (36.6 C)  TempSrc:  Oral Oral Oral  SpO2: 98% 97% 96% 98%  Weight:      Height:       No data found.   Intake/Output Summary (Last 24 hours) at 08/08/2020 1736 Last data filed at 08/08/2020 6967 Gross per 24 hour  Intake 1320 ml  Output 2035 ml  Net -715 ml   Filed Weights   07/30/20 2029 07/31/20 1522 08/05/20 1400  Weight: 132.2 kg 125.8 kg 129.3 kg    Exam:  GEN: NAD SKIN: No rash EYES: EOMI ENT: MMM CV: RRR PULM: CTA B ABD: soft, ND, NT, +BS CNS: AAO x 2 (personand place), non focal EXT: B/l leg edema, no tenderness    Data Reviewed:   I have personally reviewed following labs and imaging studies:  Labs: Labs show the following:   Basic Metabolic Panel: Recent Labs  Lab 08/02/20 0358 08/03/20 0327  08/04/20 0437 08/05/20 0338 08/06/20 0357 08/07/20 0415 08/07/20 1625 08/07/20 1629 08/07/20 1632 08/07/20 2055 08/08/20 1233  NA 143 142 145 142 139 142 141 141 141  141  --  140  K 3.7 4.0 4.1 4.7 4.7 4.1 4.5 4.3  4.3  4.4  --  4.1  CL 109 107 112* 107 107 107  --   --   --   --  99  CO2 24 25 24 27 25 28   --   --   --   --  32  GLUCOSE 136* 116* 134* 99 100* 95  --   --   --   --  115*  BUN 18 15 20 19 19 19   --   --   --   --  19  CREATININE 1.38* 1.19 1.29* 1.49* 1.48* 1.30*  --   --   --  1.57* 1.45*  CALCIUM 8.8* 8.7* 9.3 9.0 8.8* 9.7  --   --   --   --  9.8  MG 2.0 2.0 2.1 2.2  --   --   --   --   --   --   --   PHOS 4.7* 3.9 4.2 4.2  --   --   --   --   --   --   --    GFR Estimated Creatinine Clearance: 72.6 mL/min (A) (by C-G formula based on SCr of 1.45 mg/dL (H)). Liver Function Tests: Recent Labs  Lab 08/03/20 0327 08/04/20 0437  AST 33 29  ALT 35 30  ALKPHOS 39 38  BILITOT 0.5 0.4  PROT 6.0* 6.4*  ALBUMIN 3.3* 3.4*   No results for input(s): LIPASE, AMYLASE in the last 168 hours. No results for input(s): AMMONIA in the last 168 hours. Coagulation profile No results for input(s): INR, PROTIME in the last 168 hours.  CBC: Recent Labs  Lab 08/03/20 0327 08/04/20 0437 08/05/20 0338 08/06/20 0357 08/07/20 1625 08/07/20 1629 08/07/20 1632 08/07/20 2055  WBC 5.2 4.7 5.4 5.3  --   --   --  5.2  HGB 11.6* 11.3* 12.3* 11.6* 12.2* 11.6* 11.6*  11.9* 13.5  HCT 39.4 38.2* 41.3 39.4 36.0* 34.0* 34.0*  35.0* 45.3  MCV 86.4 84.9 85.2 84.5  --   --   --  83.7  PLT 162 169 173 156  --   --   --  165   Cardiac Enzymes: No results for input(s): CKTOTAL, CKMB, CKMBINDEX, TROPONINI in the last 168 hours. BNP (last 3 results) No results for input(s): PROBNP in the last 8760 hours. CBG: Recent Labs  Lab 08/07/20 1727 08/07/20 2045 08/08/20 0757 08/08/20 1300 08/08/20 1655  GLUCAP 82 196* 105* 104* 156*   D-Dimer: No results for input(s): DDIMER in the  last 72 hours. Hgb A1c: No results for input(s): HGBA1C in the last 72 hours. Lipid Profile: No results for input(s): CHOL, HDL, LDLCALC, TRIG, CHOLHDL, LDLDIRECT in the last 72 hours. Thyroid function studies: No results for input(s): TSH, T4TOTAL, T3FREE, THYROIDAB in the last 72 hours.  Invalid input(s): FREET3 Anemia work up: No results for input(s): VITAMINB12, FOLATE, FERRITIN, TIBC, IRON, RETICCTPCT in the last 72 hours. Sepsis Labs: Recent Labs  Lab 08/04/20 0437 08/05/20 0338 08/06/20 0357 08/07/20 2055  WBC 4.7 5.4 5.3 5.2    Microbiology Recent Results (from the past 240 hour(s))  Culture, Urine     Status: None   Collection Time: 07/30/20  9:00 PM   Specimen: Urine, Random  Result Value Ref Range Status   Specimen Description   Final    URINE, RANDOM Performed at [facility] [address] [place], [place] [address]    Special Requests   Final    NONE Performed  at [facility], [place] [address] [place], [place] [address]    Culture   Final    NO GROWTH Performed at [facility], [place] [address], [place], [place] [address]    Report Status 08/01/2020 FINAL  Final  Resp Panel by RT-PCR (Flu A&B, Covid) Nasopharyngeal Swab     Status: None   Collection Time: 07/30/20 11:15 PM   Specimen: Nasopharyngeal Swab; Nasopharyngeal(NP) swabs in vial transport medium  Result Value Ref Range Status   SARS Coronavirus 2 by RT PCR NEGATIVE NEGATIVE Final    Comment: (NOTE) SARS-CoV-2 target nucleic acids are NOT DETECTED.  The SARS-CoV-2 RNA is generally detectable in upper respiratory specimens during the acute phase of infection. The lowest concentration of SARS-CoV-2 viral copies this assay can detect is 138 copies/mL. A negative result does not preclude SARS-Cov-2 infection and should not be used as the sole basis for treatment or other patient management decisions. A negative result may occur with  improper specimen  collection/handling, submission of specimen other than nasopharyngeal swab, presence of viral mutation(s) within the areas targeted by this assay, and inadequate number of viral copies(<138 copies/mL). A negative result must be combined with clinical observations, patient history, and epidemiological information. The expected result is Negative.  Fact Sheet for Patients:  EntrepreneurPulse.com.au  Fact Sheet for Healthcare Providers:  IncredibleEmployment.be  This test is no t yet approved or cleared by the [place] FDA and  has been authorized for detection and/or diagnosis of SARS-CoV-2 by FDA under an Emergency Use Authorization (EUA). This EUA will remain  in effect (meaning this test can be used) for the duration of the COVID-19 declaration under Section 564(b)(1) of the Act, 21 U.S.C.section 360bbb-3(b)(1), unless the authorization is terminated  or revoked sooner.       Influenza A by PCR NEGATIVE NEGATIVE Final   Influenza B by PCR NEGATIVE NEGATIVE Final    Comment: (NOTE) The Xpert Xpress SARS-CoV-2/FLU/RSV plus assay is intended as an aid in the diagnosis of influenza from Nasopharyngeal swab specimens and should not be used as a sole basis for treatment. Nasal washings and aspirates are unacceptable for Xpert Xpress SARS-CoV-2/FLU/RSV testing.  Fact Sheet for Patients: EntrepreneurPulse.com.au  Fact Sheet for Healthcare Providers: IncredibleEmployment.be  This test is not yet approved or cleared by the [place] FDA and has been authorized for detection and/or diagnosis of SARS-CoV-2 by FDA under an Emergency Use Authorization (EUA). This EUA will remain in effect (meaning this test can be used) for the duration of the COVID-19 declaration under Section 564(b)(1) of the Act, 21 U.S.C. section 360bbb-3(b)(1), unless the authorization is terminated or revoked.  Performed at [facility], [place] [address] [place], [place] [address]   Culture, blood (Routine X 2) w Reflex to ID Panel     Status: None   Collection Time: 07/31/20  1:27 AM   Specimen: BLOOD  Result Value Ref Range Status   Specimen Description   Final    BLOOD LEFT ANTECUBITAL Performed at [facility] [address] [place], [place] [address]    Special Requests   Final    BOTTLES DRAWN AEROBIC AND ANAEROBIC Blood Culture adequate volume Performed at [facility] [address], [place], [place] [address]    Culture   Final    NO GROWTH 5 DAYS Performed at [facility], [place] [address], [place], [place] [address]    Report Status 08/05/2020 FINAL  Final  Culture, blood (Routine X 2) w Reflex to ID Panel     Status: None   Collection Time: 07/31/20  4:09 AM   Specimen: BLOOD  Result Value Ref Range Status   Specimen Description   Final    BLOOD RIGHT ANTECUBITAL Performed at [place] [address] [place], [place] [address]    Special Requests   Final    BOTTLES DRAWN AEROBIC AND ANAEROBIC Blood Culture results may not be optimal due to an inadequate volume of blood received in culture bottles Performed at [facility] [address], [place], [place] [address]    Culture   Final    NO GROWTH 5 DAYS Performed at [facility], [place] [address] [place], [place] [address]    Report Status 08/05/2020 FINAL  Final  Surgical PCR screen     Status: None   Collection Time: 08/06/20  8:21 PM   Specimen: Nasal Mucosa; Nasal Swab  Result Value Ref Range Status   MRSA, PCR NEGATIVE NEGATIVE Final   Staphylococcus aureus NEGATIVE NEGATIVE Final    Comment: (NOTE) The Xpert SA Assay (FDA approved for NASAL specimens in patients 62 years of age and older), is one component of a comprehensive surveillance program. It is not intended to diagnose infection nor to guide or monitor  treatment. Performed at [facility], [place] [address] [place], [place] [address]     Procedures and diagnostic studies:  CARDIAC CATHETERIZATION  Result Date: 08/07/2020  Hemodynamic findings consistent with moderate pulmonary hypertension.  LV end diastolic pressure is severely elevated.  There is no aortic valve stenosis.  SUMMARY  Angiographically normal coronary arteries.  Moderate Secondary Pulmonary Hypertension with PAP-mean of 51/24 mmHg - 37 mmHg; PCWP 30-32 mmHg with LVEDP of 30-35 mmHg.  In setting of known systolic heart failure this to be consistent with COMBINED SYSTOLIC AND DIASTOLIC HEART FAILURE RECOMMENDATIONS  Continue to titrate cardiomyopathy medications, he will be given additional IV Lasix in the holding area.  Needs additional afterload reduction and diuretic.  He will [facility] to [facility] after PRN removal/bedrest Glenetta Hew, MD              LOS: 8 days   Florencia Reasons MD PhD FACP [facility]   Pager on [email or web address]. If 7PM-7AM, please contact night-coverage at [email or web address]     08/08/2020, 5:36 PM

## 2020-08-08 NOTE — Plan of Care (Signed)
  Problem: Health Behavior/Discharge Planning: Goal: Ability to manage health-related needs will improve Outcome: Progressing   Problem: Clinical Measurements: Goal: Ability to maintain clinical measurements within normal limits will improve Outcome: Progressing Goal: Will remain free from infection Outcome: Progressing Goal: Diagnostic test results will improve Outcome: Progressing Goal: Respiratory complications will improve Outcome: Progressing Goal: Cardiovascular complication will be avoided Outcome: Progressing   Problem: Activity: Goal: Risk for activity intolerance will decrease Outcome: Progressing   Problem: Nutrition: Goal: Adequate nutrition will be maintained Outcome: Progressing   Problem: Coping: Goal: Level of anxiety will decrease Outcome: Progressing   Problem: Elimination: Goal: Will not experience complications related to bowel motility Outcome: Progressing Goal: Will not experience complications related to urinary retention Outcome: Progressing   Problem: Pain Managment: Goal: General experience of comfort will improve Outcome: Progressing   Problem: Safety: Goal: Ability to remain free from injury will improve Outcome: Progressing   Problem: Skin Integrity: Goal: Risk for impaired skin integrity will decrease Outcome: Progressing   Problem: Education: Goal: Understanding of CV disease, CV risk reduction, and recovery process will improve Outcome: Progressing Goal: Individualized Educational Video(s) Outcome: Progressing   Problem: Activity: Goal: Ability to return to baseline activity level will improve Outcome: Progressing   Problem: Cardiovascular: Goal: Ability to achieve and maintain adequate cardiovascular perfusion will improve Outcome: Progressing Goal: Vascular access site(s) Level 0-1 will be maintained Outcome: Progressing   Problem: Education: Goal: Knowledge of disease or condition will improve Outcome:  Progressing Goal: Knowledge of secondary prevention will improve Outcome: Progressing   

## 2020-08-08 NOTE — Progress Notes (Signed)
Pt remain forget at times, impulsive up to use the urinal. Pt with some unsteadiness. Pt forget at times, constant redirecting concerning safety concern. Pt agreeable that he need support at home due to weakness and deconditioning. Plan for am d/c per MD conversation if remain stable. Family updated for assistance. SRP, RN

## 2020-08-08 NOTE — Discharge Summary (Signed)
Discharge Summary  Matthew Barnes [id number] DOB: Mar 19, 1956  PCP: Caren Macadam, MD  Admit date: 07/30/2020 Discharge date: 08/09/2020  Time spent: 103mins, more than 50% time spent on coordination of care.  Recommendations for Outpatient Follow-up:  1. F/u with PCP within a week  for hospital discharge follow up 2. F/u with cardiology, repeat labs on 5/16, another appointment on 5/30 3. F/u with endocrinology Dr Dwyane Dee 4. F/u with psychiatry 5. Home health PT/RN/social worker arranged     Discharge Diagnoses:  Active Hospital Problems   Diagnosis Date Noted  . Aphasia 07/31/2020  . Dilated cardiomyopathy ([place])   . Elevated troponin level not due to acute coronary syndrome   . Acute on chronic systolic CHF (congestive heart failure) ([place]) 08/06/2020  . AMS (altered mental status) 07/31/2020  . Bipolar affective, mixed ([place]) 04/29/2016  . Esophageal reflux 01/17/2014  . Type 2 diabetes mellitus with diabetic neuropathy, with long-term current use of insulin ([place]) 08/31/2013  . Hypertension associated with diabetes ([place]) 05/03/2013  . Hyperlipidemia 05/03/2013    Resolved Hospital Problems  No resolved problems to display.    Discharge Condition: stable  Diet recommendation: heart healthy/carb modified  Filed Weights   07/30/20 2029 07/31/20 1522 08/05/20 1400  Weight: 132.2 kg 125.8 kg 129.3 kg    History of present illness: ( per admitting MD Dr Posey Pronto) Chief Complaint:  Depression,Anxiety,malaise.   HPI: Matthew Barnes is a 65 y.o. male seen in ed with complaints of anxiety and malaise for few weeks.HPI is limited due to pt's difficulty with recall and word finding difficulty vs perseveration. EMS vitals:  141/102,118 HR,20 RR,95% O2 sat on room air. Pt in ed upon eval keep repeating one sentence when asked anything " well you know usually I came in up and down, usually up and down" When asked what kind of up and down feeling he has  He says " well its up  and down" HPI is therefore limited. Pt is aphasic and  Pt has past medical history of depression/bipolar disorder managed by Dr. Quillian Quince [phone]./diabetes mellitus type 2/anxiety/hyperlipidemia, GERD, hypothyroidism, dysphagia. ED Course:        Vitals:   07/30/20 2045 07/30/20 2100 07/30/20 2150 07/30/20 2325  BP: (!) 155/96  (!) 175/114 (!) 142/96  Pulse: (!) 111 (!) 114 (!) 111 (!) 115  Resp: 19  18 18   Temp:   99.2 F (37.3 C)   TempSrc:   Oral   SpO2: 97% 97% 98% 97%  Weight:      Height:      In ed pt is alert,awake and afebrile, hypertensive,chest xray is negative for any active process. Ethanol level is less than 10, urine drug screen is negative, CMP shows a glucose of 207 otherwise normal, CBC shows hemoglobin of 12.2 otherwise normal, head CT without contrast done today shows no acute intracranial process.    Hospital Course:  Principal Problem:   Aphasia Active Problems:   Hypertension associated with diabetes ([place])   Hyperlipidemia   Type 2 diabetes mellitus with diabetic neuropathy, with long-term current use of insulin ([facility])   Esophageal reflux   Bipolar affective, mixed ([facility])   AMS (altered mental status)   Acute on chronic systolic CHF (congestive heart failure) ([facility])   Dilated cardiomyopathy ([facility])   Elevated troponin level not due to acute coronary syndrome   Aphasia: Improved.  Acute stroke has been ruled out.  No evidence of ischemia/stroke on MRI brain.  Acute on chronic systolic  CHF: 2D echo showed EF estimated at 20 to 25%.   Underwent  left heart cath on 5/10 with normal coronary.  He received Iv lasix post procedure, cardiology cleared him to discharge home on  carvedilol , losartan 25 mg daily, digoxin 0.125 mg daily and Lasix 40 mg daily. Close follow up with cardiology  For further meds adjustment  ( jardiance, entresot, spironolactone to be determined at follow up)  AKI versus CKD stage IIIa: Creatinine stable.  Repeat  BMP stable  after left heart cath.  Hypertension: stable on current regimen  Insulin-dependent diabetes mellitus: uncontrolled , a1c 8.3% Suspect diet and meds noncompliance Continue Levemir and NovoLog. F/u with endocrinology    Bipolar disorder: stable, pleasant and cooperative continue home meds lithium/seroquel Lithium level 0.37 on presentation, suspect medication noncompliance , follow up with psychiatry   cognitive impairment?: he is AAOX3 There is some concern about patient living at home alone by himself because of cognitive impairment.   Case manager consulted, home health PT/RN/social worker arranged    Procedures:  Cardiac cath on 5/10  Consultations: Cardiology   Speech   Physical therapist  Case manager   Family update: talked to brother over the phone prior to discharge   Discharge Exam: BP 110/71 (BP Location: Left Arm)   Pulse 81   Temp 98.1 F (36.7 C)   Resp 20   Ht 6\' 1"  (1.854 m)   Wt 129.3 kg   SpO2 97%   BMI 37.62 kg/m   General: NAD, pleasant, AAOX3 Cardiovascular: RRR Respiratory: CTABL  Discharge Instructions You were cared for by a hospitalist during your hospital stay. If you have any questions about your discharge medications Barnes the care you received while you were in the hospital after you are discharged, you can call the unit and asked to speak with the hospitalist on call if the hospitalist that took care of you is not available. Once you are discharged, your primary care physician will handle any further medical issues. Please note that NO REFILLS for any discharge medications will be authorized once you are discharged, as it is imperative that you return to your primary care physician (Barnes establish a relationship with a primary care physician if you do not have one) for your aftercare needs so that they can reassess your need for medications and monitor your lab values.  Discharge Instructions    Diet - low sodium heart  healthy   Complete by: As directed    Carb modified diet   Increase activity slowly   Complete by: As directed      Allergies as of 08/09/2020      Reactions   Influenza Vaccines Swelling   Reports of swelling of the arm and then sick for 10 days      Medication List    STOP taking these medications   amLODipine 10 MG tablet Commonly known as: NORVASC   hydrochlorothiazide 25 MG tablet Commonly known as: HYDRODIURIL   lovastatin 20 MG tablet Commonly known as: MEVACOR     TAKE these medications   atorvastatin 20 MG tablet Commonly known as: LIPITOR Take 1 tablet (20 mg total) by mouth daily.   Blood Pressure Cuff Misc Use as directed What changed:   how much to take  how to take this  when to take this  additional instructions   carvedilol 6.25 MG tablet Commonly known as: COREG Take 1 tablet (6.25 mg total) by mouth 2 (two) times daily with a meal.  digoxin 0.125 MG tablet Commonly known as: LANOXIN Take 1 tablet (0.125 mg total) by mouth daily.   furosemide 40 MG tablet Commonly known as: LASIX Take 1 tablet (40 mg total) by mouth daily.   INSULIN SYRINGE 1CC/31GX5/16" 31G X 5/16" 1 ML Misc Use 3 needles per day What changed:   how much to take  how to take this  when to take this  additional instructions   lithium carbonate 300 MG capsule Take 3 capsules (900 mg total) by mouth daily.   losartan 25 MG tablet Commonly known as: COZAAR Take 1 tablet (25 mg total) by mouth daily. What changed:   medication strength  how much to take   metFORMIN 1000 MG tablet Commonly known as: GLUCOPHAGE TAKE 1 TABLET BY MOUTH 2 TIMES DAILY WITH A MEAL.   NovoLIN 70/30 (70-30) 100 UNIT/ML injection Generic drug: insulin NPH-regular Human Inject 40 Units into the skin 2 (two) times daily with a meal.   OneTouch Verio test strip Generic drug: glucose blood USE AS INSTRUCTED TO CHECK BLOOD SUGAR THREE TIMES A DAY. What changed: See the new  instructions.   pantoprazole 20 MG tablet Commonly known as: PROTONIX TAKE 1 TABLET BY MOUTH TWICE A DAY BEFORE MEALS What changed: See the new instructions.   QUEtiapine 200 MG tablet Commonly known as: SEROQUEL Take 200-300 mg by mouth at bedtime.   sildenafil 50 MG tablet Commonly known as: Viagra Take 1 tablet (50 mg total) by mouth daily as needed for erectile dysfunction. Do not take more then once in 24 hours.   thiamine 100 MG tablet Take 1 tablet (100 mg total) by mouth daily.      Allergies  Allergen Reactions  . Influenza Vaccines Swelling    Reports of swelling of the arm and then sick for 10 days    Follow-up Information    Deberah Pelton, NP Follow up on 08/28/2020.   Specialty: Cardiology Why: 9:15 am Contact information: [address] [place] [address] [phone]        Caren Macadam, MD Follow up in 2 week(s).   Specialty: Family Medicine Why: hospital discharge follow up Contact information: [place] [address] [phone]        Elayne Snare, MD Follow up in 3 week(s).   Specialty: Endocrinology Why: for diabetes control, your a1c is 8.3, goal is less than 7%, please work with your endocrinologist for diabetes control, Contact information: [place] [address] [phone]        f/u with your psychiatrist Follow up.   Why: psychiatry monitor your psychiatry meds including lithium and seroquel       Care, [facility] Follow up.   Specialty: Home Health Services Why: will follow you at home for home health. please call if you have any question Barnes concerns. [place].  Contact information: [place] [address] [phone]                The results of significant diagnostics from this hospitalization (including imaging, microbiology, ancillary and laboratory) are listed below for reference.     Significant Diagnostic Studies: DG Chest 2 View  Result Date: 07/30/2020 CLINICAL DATA:  Cough EXAM: CHEST - 2 VIEW COMPARISON:  05/21/2020 FINDINGS: The heart size and mediastinal contours are within normal limits. Both lungs are clear. The visualized skeletal structures are unremarkable. IMPRESSION: No active cardiopulmonary disease. Electronically Signed  By: Ulyses Jarred M.D.   On: 07/30/2020 21:06   CT Head Wo Contrast  Result Date: 07/30/2020 CLINICAL DATA:  Neurologic deficit, worsening depression EXAM: CT HEAD WITHOUT CONTRAST TECHNIQUE: Contiguous axial images were obtained from the base of the skull through the vertex without intravenous contrast. COMPARISON:  07/13/2019, 05/13/2010 FINDINGS: Brain: No acute infarct Barnes hemorrhage. Lateral ventricles and midline structures are unremarkable. No acute extra-axial fluid collections. No mass effect. Vascular: No hyperdense vessel Barnes unexpected calcification. Skull: Normal. Negative for fracture Barnes focal lesion. Sinuses/Orbits: Mild mucoperiosteal thickening of the right maxillary and left sphenoid sinuses. Other: None. IMPRESSION: 1. No acute intracranial process. Electronically Signed   By: Randa Ngo M.D.   On: 07/30/2020 23:26   CT ANGIO CHEST PE W Barnes WO CONTRAST  Result Date: 07/31/2020 CLINICAL DATA:  Cough. EXAM: CT ANGIOGRAPHY CHEST WITH CONTRAST TECHNIQUE: Multidetector CT imaging of the chest was performed using the standard protocol during bolus administration of intravenous contrast. Multiplanar CT image reconstructions and MIPs were obtained to evaluate the vascular anatomy. CONTRAST:  167mL OMNIPAQUE IOHEXOL 350 MG/ML SOLN COMPARISON:  None. FINDINGS: The study is markedly limited secondary to patient motion and the inability of the patient to raise his arms up during the exam. As result, motion artifact and beam hardening artifact are seen. Cardiovascular: There is very mild calcification of the aortic arch without evidence of  aortic aneurysm. The subsegmental pulmonary arteries are limited in evaluation secondary to suboptimal opacification with intravenous contrast. Motion artifact is also seen involving the segmental and subsegmental pulmonary arteries. Ill-defined areas of intraluminal low attenuation are seen involving multiple upper lobe and lower lobe branches of the bilateral pulmonary arteries. There is mild to moderate severity cardiomegaly. No pericardial effusion. Mediastinum/Nodes: Multiple subcentimeter pretracheal and AP window lymph nodes are seen. Thyroid gland, trachea, and esophagus demonstrate no significant findings. Lungs/Pleura: Mild atelectasis and/Barnes infiltrate is seen within the posterior aspect of the left lung base. There is no evidence of a pleural effusion Barnes pneumothorax. Upper Abdomen: No acute abnormality. Musculoskeletal: No chest wall abnormality. No acute Barnes significant osseous findings. Review of the MIP images confirms the above findings. IMPRESSION: 1. Markedly limited study with areas of intraluminal low attenuation involving bilateral upper lobe and lower lobe branches of the pulmonary arteries. While this may be secondary to extensive areas of artifact, bilateral pulmonary embolism cannot completely be excluded. Additional evaluation with a nuclear medicine ventilation/perfusion scan Barnes follow-up chest CTA is recommended. 2. Mild left basilar atelectasis and/Barnes infiltrate. Electronically Signed   By: Virgina Norfolk M.D.   On: 07/31/2020 03:02   MR ANGIO HEAD WO CONTRAST  Result Date: 08/03/2020 CLINICAL DATA:  Abnormal speech. Patient was unable to tolerate additional imaging including coronal images Barnes postcontrast images. EXAM: MRI HEAD WITHOUT CONTRAST MRA HEAD WITHOUT CONTRAST TECHNIQUE: Multiplanar, multiecho pulse sequences of the brain and surrounding structures were obtained without intravenous contrast. Angiographic images of the head were obtained using MRA technique without  contrast. COMPARISON:  CT head without contrast 07/30/2020. MR head without and with contrast 07/13/2019 FINDINGS: MRI HEAD FINDINGS Brain: Axial diffusion-weighted images demonstrate no acute Barnes subacute infarction. Patient did not tolerate coronal diffusion images. Mild atrophy and white matter changes are stable and within normal limits for age. The ventricles are of normal size. No significant extraaxial fluid collection is present. The internal auditory canals are within normal limits. The brainstem and cerebellum are within normal limits. Vascular: Flow is present in the major intracranial arteries.  Skull and upper cervical spine: The craniocervical junction is normal. 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 Marrow signal is somewhat depressed diffusely. No discrete lesions present. Sinuses/Orbits: Mild mucosal thickening is present in the right maxillary sinus and scattered throughout the right ethmoid air cells. No fluid levels are present. Left sphenoid sinus mucosal thickening noted. There is some fluid in the right mastoid air cells. No obstructing nasopharyngeal lesion is present. The globes and orbits are within normal limits. MRA HEAD FINDINGS Mild tortuosity is present high cervical segments without significant stenosis. There is no stenosis through the ICA termini. A1 and M1 segments are within normal limits. No definite anterior communicating artery is present. MCA bifurcations are intact. ACA and MCA branch vessels are within normal limits. There is some attenuation due to artifact. Vertebrobasilar junction visualized. Lower vertebral arteries and PICA origins not seen. Left AICA is dominant. Basilar artery is normal. Both posterior cerebral arteries originate from basilar tip. PCA branch vessels are within normal limits. IMPRESSION: 1. Normal MRI appearance of the brain for age. 2. Mild sinus disease and right mastoid effusion. No obstructing nasopharyngeal lesion is present. 3. Normal MRA Circle of Willis  without evidence for significant proximal stenosis, aneurysm, Barnes branch vessel occlusion. Electronically Signed   By: San Morelle M.D.   On: 08/03/2020 18:04   MR BRAIN WO CONTRAST  Result Date: 08/03/2020 CLINICAL DATA:  Abnormal speech. Patient was unable to tolerate additional imaging including coronal images Barnes postcontrast images. EXAM: MRI HEAD WITHOUT CONTRAST MRA HEAD WITHOUT CONTRAST TECHNIQUE: Multiplanar, multiecho pulse sequences of the brain and surrounding structures were obtained without intravenous contrast. Angiographic images of the head were obtained using MRA technique without contrast. COMPARISON:  CT head without contrast 07/30/2020. MR head without and with contrast 07/13/2019 FINDINGS: MRI HEAD FINDINGS Brain: Axial diffusion-weighted images demonstrate no acute Barnes subacute infarction. Patient did not tolerate coronal diffusion images. Mild atrophy and white matter changes are stable and within normal limits for age. The ventricles are of normal size. No significant extraaxial fluid collection is present. The internal auditory canals are within normal limits. The brainstem and cerebellum are within normal limits. Vascular: Flow is present in the major intracranial arteries. Skull and upper cervical spine: The craniocervical junction is normal. 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 Marrow signal is somewhat depressed diffusely. No discrete lesions present. Sinuses/Orbits: Mild mucosal thickening is present in the right maxillary sinus and scattered throughout the right ethmoid air cells. No fluid levels are present. Left sphenoid sinus mucosal thickening noted. There is some fluid in the right mastoid air cells. No obstructing nasopharyngeal lesion is present. The globes and orbits are within normal limits. MRA HEAD FINDINGS Mild tortuosity is present high cervical segments without significant stenosis. There is no stenosis through the ICA termini. A1 and M1 segments are within normal  limits. No definite anterior communicating artery is present. MCA bifurcations are intact. ACA and MCA branch vessels are within normal limits. There is some attenuation due to artifact. Vertebrobasilar junction visualized. Lower vertebral arteries and PICA origins not seen. Left AICA is dominant. Basilar artery is normal. Both posterior cerebral arteries originate from basilar tip. PCA branch vessels are within normal limits. IMPRESSION: 1. Normal MRI appearance of the brain for age. 2. Mild sinus disease and right mastoid effusion. No obstructing nasopharyngeal lesion is present. 3. Normal MRA Circle of Willis without evidence for significant proximal stenosis, aneurysm, Barnes branch vessel occlusion. Electronically Signed   By: San Morelle M.D.   On:  08/03/2020 18:04   CARDIAC CATHETERIZATION  Result Date: 08/07/2020  Hemodynamic findings consistent with moderate pulmonary hypertension.  LV end diastolic pressure is severely elevated.  There is no aortic valve stenosis.  SUMMARY  Angiographically normal coronary arteries.  Moderate Secondary Pulmonary Hypertension with PAP-mean of 51/24 mmHg - 37 mmHg; PCWP 30-32 mmHg with LVEDP of 30-35 mmHg.  In setting of known systolic heart failure this to be consistent with COMBINED SYSTOLIC AND DIASTOLIC HEART FAILURE RECOMMENDATIONS  Continue to titrate cardiomyopathy medications, he will be given additional IV Lasix in the holding area.  Needs additional afterload reduction and diuretic.  He will return to [facility] after PRN removal/bedrest Glenetta Hew, MD  EEG adult  Result Date: 08/01/2020 [name], MD     08/01/2020  6:06 PM Patient Name: TAJE EADS MRN: [id number] Epilepsy Attending: [name] Referring Physician/Provider: Dr Kerney Elbe Date: 08/01/2020 Duration: 25.13 mins Patient history: 65yo M with speech disturbance and ams. EEG to evaluate for seizure. Level of alertness: Awake AEDs during EEG study: None  Technical aspects: This EEG study was done with scalp electrodes positioned according to the 10-20 International system of electrode placement. Electrical activity was acquired at a sampling rate of 500Hz  and reviewed with a high frequency filter of 70Hz  and a low frequency filter of 1Hz . EEG data were recorded continuously and digitally stored. Description: The posterior dominant rhythm consists of 7 Hz activity of moderate voltage (25-35 uV) seen predominantly in posterior head regions, symmetric and reactive to eye opening and eye closing. EEG showed continuous generalized 5 to 6 Hz theta slowing.  Hyperventilation and photic stimulation were not performed.   ABNORMALITY - Continuous slow, generalized - Background slow IMPRESSION: This study is suggestive of moderate diffuse encephalopathy, nonspecific etiology. No seizures Barnes epileptiform discharges were seen throughout the recording. Priyanka O Yadav   VAS US CAROTID (at [facility] and [facility] only)  Result Date: 07/31/2020 Carotid Arterial Duplex Study Patient Name:  Matthew Barnes  Date of Exam:   07/31/2020 Medical Rec #: [id number]     Accession #:    [id number] Date of Birth: 06/28/1955    Patient Gender: M Patient Age:   64Y Exam Location:  [facility] Procedure:      VAS US CAROTID Referring Phys: [id number] [place] --------------------------------------------------------------------------------  Indications:       Aphasia. Risk Factors:      Hypertension, hyperlipidemia, Diabetes. Limitations        Today's exam was limited due to the body habitus of the                    patient, the patient's respiratory variation and patient                    agitation, constant patient movement. Comparison Study:  No prior studies. Performing Technologist: Oliver Hum RVT  Examination Guidelines: A complete evaluation includes B-mode imaging, spectral Doppler, color Doppler, and power Doppler as needed of all accessible portions of each vessel. Bilateral testing  is considered an integral part of a complete examination. Limited examinations for reoccurring indications may be performed as noted.  Right Carotid Findings: +----------+--------+--------+--------+-----------------------+--------+           PSV cm/sEDV cm/sStenosisPlaque Description     Comments +----------+--------+--------+--------+-----------------------+--------+ CCA Prox  84      16              smooth and heterogenoustortuous +----------+--------+--------+--------+-----------------------+--------+ CCA  Distal43      8               smooth and heterogenous         +----------+--------+--------+--------+-----------------------+--------+ ICA Prox  43      13              smooth and heterogenous         +----------+--------+--------+--------+-----------------------+--------+ ICA Distal67      23                                     tortuous +----------+--------+--------+--------+-----------------------+--------+ ECA       52      8                                               +----------+--------+--------+--------+-----------------------+--------+ +----------+--------+-------+--------+-------------------+           PSV cm/sEDV cmsDescribeArm Pressure (mmHG) +----------+--------+-------+--------+-------------------+ [id number]                                        +----------+--------+-------+--------+-------------------+ +---------+--------+--+--------+-+---------+ VertebralPSV cm/s30EDV cm/s9Antegrade +---------+--------+--+--------+-+---------+  Left Carotid Findings: +----------+--------+--------+--------+-----------------------+--------+           PSV cm/sEDV cm/sStenosisPlaque Description     Comments +----------+--------+--------+--------+-----------------------+--------+ CCA Prox  70      9               smooth and heterogenous         +----------+--------+--------+--------+-----------------------+--------+ CCA Distal55       12              smooth and heterogenous         +----------+--------+--------+--------+-----------------------+--------+ ICA Prox  54      15              smooth and heterogenoustortuous +----------+--------+--------+--------+-----------------------+--------+ ICA Distal54      14                                     tortuous +----------+--------+--------+--------+-----------------------+--------+ ECA       66      9                                               +----------+--------+--------+--------+-----------------------+--------+ Unable to obtain vertebral and subclavian artery images due to patient agitation.  Summary: Right Carotid: Velocities in the right ICA are consistent with a 1-39% stenosis. Left Carotid: Velocities in the left ICA are consistent with a 1-39% stenosis. Vertebrals: Right vertebral artery demonstrates antegrade flow. *See table(s) above for measurements and observations.  Electronically signed by Curt Jews MD on 07/31/2020 at 8:20:50 PM.    Final    ECHOCARDIOGRAM LIMITED  Result Date: 08/03/2020    ECHOCARDIOGRAM LIMITED REPORT   Patient Name:   Matthew Barnes Date of Exam: 08/03/2020 Medical Rec #:  [id number]    Height:       73.0 in Accession #:    [id number]   Weight:       277.3 lb Date  of Birth:  22-Dec-1955   BSA:          2.472 m Patient Age:    39 years     BP:           154/92 mmHg Patient Gender: M            HR:           96 bpm. Exam Location:  Inpatient Procedure: Limited Echo and Intracardiac Opacification Agent Indications:    Rule out thrombus/ Stroke  History:        Patient has prior history of Echocardiogram examinations, most                 recent 08/01/2020.  Sonographer:    Merrie Roof Referring Phys: [place] Comments: This was a limited echo to rule out thrombus. IMPRESSIONS  1. Limited contrast echo for LV thrombus evaluation. No evidence of LV thrombus. Left ventricular ejection fraction, by estimation, is 20 to 25%.  The left ventricle has severely decreased function. The left ventricle demonstrates global hypokinesis. Conclusion(s)/Recommendation(s): No left ventricular mural Barnes apical thrombus/thrombi. FINDINGS  Left Ventricle: Limited contrast echo for LV thrombus evaluation. No evidence of LV thrombus. Left ventricular ejection fraction, by estimation, is 20 to 25%. The left ventricle has severely decreased function. The left ventricle demonstrates global hypokinesis. Definity contrast agent was given IV to delineate the left ventricular endocardial borders. Eleonore Chiquito MD Electronically signed by Eleonore Chiquito MD Signature Date/Time: 08/03/2020/5:16:04 PM    Final    ECHOCARDIOGRAM LIMITED  Result Date: 08/01/2020    ECHOCARDIOGRAM REPORT   Patient Name:   Matthew Barnes Date of Exam: 08/01/2020 Medical Rec #:  [id number]    Height:       73.0 in Accession #:    [id number]   Weight:       277.3 lb Date of Birth:  12/12/1955   BSA:          2.472 m Patient Age:    4 years     BP:           127/89 mmHg Patient Gender: M            HR:           60 bpm. Exam Location:  Inpatient Procedure: 2D Echo, Cardiac Doppler and Color Doppler Indications:    CVA  History:        Patient has no prior history of Echocardiogram examinations.                 Risk Factors:Hypertension and Dyslipidemia.  Sonographer:    Luisa Hart RDCS Referring Phys: [id number] EKTA V PATEL  Sonographer Comments: Technically difficult study due to poor echo windows and patient is morbidly obese. Image acquisition challenging due to uncooperative patient and Image acquisition challenging due to patient behavioral factors. IMPRESSIONS  1. No subcostal images.  2. Left ventricular ejection fraction, by estimation, is 20 to 25%. The left ventricle has severely decreased function. The left ventricle demonstrates global hypokinesis. The left ventricular internal cavity size was severely dilated. Left ventricular diastolic parameters are indeterminate.  3. Right  ventricular systolic function is normal. The right ventricular size is normal. There is mildly elevated pulmonary artery systolic pressure.  4. The pericardial effusion is posterior to the left ventricle.  5. The mitral valve is normal in structure. Mild mitral valve regurgitation. No evidence of mitral stenosis.  6. The aortic valve is tricuspid.  Aortic valve regurgitation is not visualized. Mild aortic valve sclerosis is present, with no evidence of aortic valve stenosis.  7. The inferior vena cava is normal in size with greater than 50% respiratory variability, suggesting right atrial pressure of 3 mmHg. FINDINGS  Left Ventricle: Left ventricular ejection fraction, by estimation, is 20 to 25%. The left ventricle has severely decreased function. The left ventricle demonstrates global hypokinesis. The left ventricular internal cavity size was severely dilated. There is no left ventricular hypertrophy. Left ventricular diastolic parameters are indeterminate. Right Ventricle: The right ventricular size is normal. No increase in right ventricular wall thickness. Right ventricular systolic function is normal. There is mildly elevated pulmonary artery systolic pressure. The tricuspid regurgitant velocity is 3.03  m/s, and with an assumed right atrial pressure of 3 mmHg, the estimated right ventricular systolic pressure is [id number]. Left Atrium: Left atrial size was normal in size. Right Atrium: Right atrial size was normal in size. Pericardium: Trivial pericardial effusion is present. The pericardial effusion is posterior to the left ventricle. Mitral Valve: The mitral valve is normal in structure. Mild mitral valve regurgitation. No evidence of mitral valve stenosis. Tricuspid Valve: The tricuspid valve is normal in structure. Tricuspid valve regurgitation is mild . No evidence of tricuspid stenosis. Aortic Valve: The aortic valve is tricuspid. Aortic valve regurgitation is not visualized. Mild aortic valve sclerosis  is present, with no evidence of aortic valve stenosis. Pulmonic Valve: The pulmonic valve was normal in structure. Pulmonic valve regurgitation is not visualized. No evidence of pulmonic stenosis. Aorta: The aortic root is normal in size and structure. Venous: The inferior vena cava is normal in size with greater than 50% respiratory variability, suggesting right atrial pressure of 3 mmHg. IAS/Shunts: The interatrial septum was not well visualized. Additional Comments: No subcostal images.  LEFT VENTRICLE PLAX 2D LVIDd:         7.20 cm LVIDs:         6.10 cm LV PW:         1.40 cm LV IVS:        0.80 cm LVOT diam:     2.30 cm LVOT Area:     4.15 cm  RIGHT VENTRICLE RV S prime:     13.40 cm/s LEFT ATRIUM            Index       RIGHT ATRIUM           Index LA Vol (A4C): 118.0 ml 47.73 ml/m RA Area:     26.60 cm                                    RA Volume:   101.00 ml 40.85 ml/m                        PULMONIC VALVE AORTA                 PV Vmax:       0.86 m/s Ao Root diam: 3.70 cm PV Vmean:      56.900 cm/s                       PV VTI:        0.126 m  PV Peak grad:  3.0 mmHg                       PV Mean grad:  1.0 mmHg  TRICUSPID VALVE TR Peak grad:   36.7 mmHg TR Vmax:        303.00 cm/s  SHUNTS Systemic Diam: 2.30 cm Jenkins Rouge MD Electronically signed by Jenkins Rouge MD Signature Date/Time: 08/01/2020/4:10:01 PM    Final     Microbiology: Recent Results (from the past 240 hour(s))  Culture, Urine     Status: None   Collection Time: 07/30/20  9:00 PM   Specimen: Urine, Random  Result Value Ref Range Status   Specimen Description   Final    URINE, RANDOM Performed at [facility] [address], [place], [place] [address]    Special Requests   Final    NONE Performed at [facility], [place] [address] [place], [place] [address]    Culture   Final    NO GROWTH Performed at [facility], [place] [address] [place], [place]  [address]    Report Status 08/01/2020 FINAL  Final  Resp Panel by RT-PCR (Flu A&B, Covid) Nasopharyngeal Swab     Status: None   Collection Time: 07/30/20 11:15 PM   Specimen: Nasopharyngeal Swab; Nasopharyngeal(NP) swabs in vial transport medium  Result Value Ref Range Status   SARS Coronavirus 2 by RT PCR NEGATIVE NEGATIVE Final    Comment: (NOTE) SARS-CoV-2 target nucleic acids are NOT DETECTED.  The SARS-CoV-2 RNA is generally detectable in upper respiratory specimens during the acute phase of infection. The lowest concentration of SARS-CoV-2 viral copies this assay can detect is 138 copies/mL. A negative result does not preclude SARS-Cov-2 infection and should not be used as the sole basis for treatment Barnes other patient management decisions. A negative result may occur with  improper specimen collection/handling, submission of specimen other than nasopharyngeal swab, presence of viral mutation(s) within the areas targeted by this assay, and inadequate number of viral copies(<138 copies/mL). A negative result must be combined with clinical observations, patient history, and epidemiological information. The expected result is Negative.  Fact Sheet for Patients:  EntrepreneurPulse.com.au  Fact Sheet for Healthcare Providers:  IncredibleEmployment.be  This test is no t yet approved Barnes cleared by the [place] FDA and  has been authorized for detection and/Barnes diagnosis of SARS-CoV-2 by FDA under an Emergency Use Authorization (EUA). This EUA will remain  in effect (meaning this test can be used) for the duration of the COVID-19 declaration under Section 564(b)(1) of the Act, 21 U.S.C.section 360bbb-3(b)(1), unless the authorization is terminated  Barnes revoked sooner.       Influenza A by PCR NEGATIVE NEGATIVE Final   Influenza B by PCR NEGATIVE NEGATIVE Final    Comment: (NOTE) The Xpert Xpress SARS-CoV-2/FLU/RSV plus assay is intended as an  aid in the diagnosis of influenza from Nasopharyngeal swab specimens and should not be used as a sole basis for treatment. Nasal washings and aspirates are unacceptable for Xpert Xpress SARS-CoV-2/FLU/RSV testing.  Fact Sheet for Patients: EntrepreneurPulse.com.au  Fact Sheet for Healthcare Providers: IncredibleEmployment.be  This test is not yet approved Barnes cleared by the [place] FDA and has been authorized for detection and/Barnes diagnosis of SARS-CoV-2 by FDA under an Emergency Use Authorization (EUA). This EUA will remain in effect (meaning this test can be used) for the duration of the COVID-19 declaration under Section 564(b)(1) of  the Act, 21 U.S.C. section 360bbb-3(b)(1), unless the authorization is terminated Barnes revoked.  Performed at [facility], [place] [address], [place], [place] [address]   Culture, blood (Routine X 2) w Reflex to ID Panel     Status: None   Collection Time: 07/31/20  1:27 AM   Specimen: BLOOD  Result Value Ref Range Status   Specimen Description   Final    BLOOD LEFT ANTECUBITAL Performed at [facility] [address], [place], [place] [address]    Special Requests   Final    BOTTLES DRAWN AEROBIC AND ANAEROBIC Blood Culture adequate volume Performed at [facility] [address] [place], [place] [address]    Culture   Final    NO GROWTH 5 DAYS Performed at [facility], [place] [address] [place], [place] [address]    Report Status 08/05/2020 FINAL  Final  Culture, blood (Routine X 2) w Reflex to ID Panel     Status: None   Collection Time: 07/31/20  4:09 AM   Specimen: BLOOD  Result Value Ref Range Status   Specimen Description   Final    BLOOD RIGHT ANTECUBITAL Performed at [facility] [address], [place], [place] [address]    Special Requests   Final    BOTTLES DRAWN AEROBIC AND ANAEROBIC Blood Culture results  may not be optimal due to an inadequate volume of blood received in culture bottles Performed at [facility] [address], [place], [place] [address]    Culture   Final    NO GROWTH 5 DAYS Performed at [facility], [place] [address] [place], [place] [address]    Report Status 08/05/2020 FINAL  Final  Surgical PCR screen     Status: None   Collection Time: 08/06/20  8:21 PM   Specimen: Nasal Mucosa; Nasal Swab  Result Value Ref Range Status   MRSA, PCR NEGATIVE NEGATIVE Final   Staphylococcus aureus NEGATIVE NEGATIVE Final    Comment: (NOTE) The Xpert SA Assay (FDA approved for NASAL specimens in patients 49 years of age and older), is one component of a comprehensive surveillance program. It is not intended to diagnose infection nor to guide Barnes monitor treatment. Performed at [facility], [place] [address], [place], [place] [address]      Labs: Basic Metabolic Panel: Recent Labs  Lab 08/03/20 0327 08/04/20 0437 08/05/20 [id number] 08/06/20 0357 08/07/20 0415 08/07/20 1625 08/07/20 1629 08/07/20 1632 08/07/20 2055 08/08/20 1233  NA 142 145 142 139 142 141 141 141  141  --  140  K 4.0 4.1 4.7 4.7 4.1 4.5 4.3 4.3  4.4  --  4.1  CL 107 112* 107 107 107  --   --   --   --  99  CO2 25 24 27 25 28   --   --   --   --  32  GLUCOSE 116* 134* 99 100* 95  --   --   --   --  115*  BUN 15 20 19 19 19   --   --   --   --  19  CREATININE 1.19 1.29* 1.49* 1.48* 1.30*  --   --   --  1.57* 1.45*  CALCIUM 8.7* 9.3 9.0 8.8* 9.7  --   --   --   --  9.8  MG 2.0 2.1 2.2  --   --   --   --   --   --   --  PHOS 3.9 4.2 4.2  --   --   --   --   --   --   --    Liver Function Tests: Recent Labs  Lab 08/03/20 0327 08/04/20 0437  AST 33 29  ALT 35 30  ALKPHOS 39 38  BILITOT 0.5 0.4  PROT 6.0* 6.4*  ALBUMIN 3.3* 3.4*   No results for input(s): LIPASE, AMYLASE in the last 168 hours. No results for input(s): AMMONIA in the last 168  hours. CBC: Recent Labs  Lab 08/03/20 0327 08/04/20 0437 08/05/20 0338 08/06/20 0357 08/07/20 1625 08/07/20 1629 08/07/20 1632 08/07/20 2055  WBC 5.2 4.7 5.4 5.3  --   --   --  5.2  HGB 11.6* 11.3* 12.3* 11.6* 12.2* 11.6* 11.6*  11.9* 13.5  HCT 39.4 38.2* 41.3 39.4 36.0* 34.0* 34.0*  35.0* 45.3  MCV 86.4 84.9 85.2 84.5  --   --   --  83.7  PLT 162 169 173 156  --   --   --  165   Cardiac Enzymes: No results for input(s): CKTOTAL, CKMB, CKMBINDEX, TROPONINI in the last 168 hours. BNP: BNP (last 3 results) Recent Labs    07/31/20 0007  BNP 372.3*    ProBNP (last 3 results) No results for input(s): PROBNP in the last 8760 hours.  CBG: Recent Labs  Lab 08/08/20 1300 08/08/20 1655 08/08/20 2053 08/09/20 0854 08/09/20 1229  GLUCAP 104* 156* 146* 116* 131*       Signed:  Florencia Reasons MD, PhD, FACP  [facility] 08/09/2020, 7:13 PM

## 2020-08-08 NOTE — Progress Notes (Addendum)
Progress Note  Patient Name: Matthew Barnes Date of Encounter: 08/08/2020  [facility] Cardiologist: Quay Burow, MD   Subjective   Pt continues to cough when flat, but overall feels much better. In good spirits this morning. Discussed addition of GDMT.  Inpatient Medications    Scheduled Meds: .  stroke: mapping our early stages of recovery book   Does not apply Once  . atorvastatin  20 mg Oral Daily  . carvedilol  6.25 mg Oral BID WC  . gatifloxacin  1 drop Both Eyes QID  . guaiFENesin  600 mg Oral BID  . heparin  5,000 Units Subcutaneous Q8H  . insulin aspart  0-15 Units Subcutaneous TID WC  . insulin aspart  0-5 Units Subcutaneous QHS  . insulin detemir  10 Units Subcutaneous BID  . lithium carbonate  900 mg Oral Daily  . mupirocin ointment  1 application Nasal BID  . pantoprazole  40 mg Oral BID AC  . QUEtiapine  400 mg Oral QHS  . sodium chloride flush  3 mL Intravenous Q12H  . sodium chloride flush  3 mL Intravenous Q12H  . thiamine  100 mg Oral Daily   Continuous Infusions: . sodium chloride    . sodium chloride    . sodium chloride     PRN Meds: sodium chloride, sodium chloride, acetaminophen **OR** acetaminophen, ondansetron **OR** ondansetron (ZOFRAN) IV, QUEtiapine, sodium chloride flush, sodium chloride flush   Vital Signs    Vitals:   08/07/20 1910 08/07/20 1930 08/08/20 0131 08/08/20 0538  BP: (!) 107/43 (!) 146/73 (!) 154/92 120/83  Pulse: 89 93 89 88  Resp: (!) 25 (!) 24 17 17   Temp:   97.8 F (36.6 C) 98 F (36.7 C)  TempSrc:   Oral Oral  SpO2: 96% 98% 97% 96%  Weight:      Height:        Intake/Output Summary (Last 24 hours) at 08/08/2020 0914 Last data filed at 08/08/2020 0400 Gross per 24 hour  Intake 960 ml  Output 2485 ml  Net -1525 ml   Last 3 Weights 08/05/2020 07/31/2020 07/30/2020  Weight (lbs) 285 lb 2 oz 277 lb 5.4 oz 291 lb 7.2 oz  Weight (kg) 129.332 kg 125.8 kg 132.2 kg      Telemetry    Sinus rhythm HR 80-90s -  Personally Reviewed  ECG    No new tracings - Personally Reviewed  Physical Exam   GEN: obese male, NAD Neck: No JVD Cardiac: RRR, no murmurs, rubs, or gallops.  Respiratory: scattered crackles in bases GI: Soft, nontender, non-distended  MS: 1+ B  LE edema; No deformity. Neuro:  aphasia seems improved today Psych: Normal affect   Labs    High Sensitivity Troponin:   Recent Labs  Lab 07/31/20 0127 07/31/20 0409 07/31/20 0740  TROPONINIHS 32* 33* 34*      Chemistry Recent Labs  Lab 08/03/20 0327 08/04/20 0437 08/05/20 0338 08/06/20 0357 08/07/20 0415 08/07/20 1625 08/07/20 1629 08/07/20 1632 08/07/20 2055  NA 142 145 142 139 142 141 141 141  141  --   K 4.0 4.1 4.7 4.7 4.1 4.5 4.3 4.3  4.4  --   CL 107 112* 107 107 107  --   --   --   --   CO2 25 24 27 25 28   --   --   --   --   GLUCOSE 116* 134* 99 100* 95  --   --   --   --  BUN 15 20 19 19 19   --   --   --   --   CREATININE 1.19 1.29* 1.49* 1.48* 1.30*  --   --   --  1.57*  CALCIUM 8.7* 9.3 9.0 8.8* 9.7  --   --   --   --   PROT 6.0* 6.4*  --   --   --   --   --   --   --   ALBUMIN 3.3* 3.4*  --   --   --   --   --   --   --   AST 33 29  --   --   --   --   --   --   --   ALT 35 30  --   --   --   --   --   --   --   ALKPHOS 39 38  --   --   --   --   --   --   --   BILITOT 0.5 0.4  --   --   --   --   --   --   --   GFRNONAA >60 >60 52* 53* >60  --   --   --  49*  ANIONGAP 10 9 8 7 7   --   --   --   --      Hematology Recent Labs  Lab 08/05/20 0338 08/06/20 0357 08/07/20 1625 08/07/20 1629 08/07/20 1632 08/07/20 2055  WBC 5.4 5.3  --   --   --  5.2  RBC 4.85 4.66  --   --   --  5.41  HGB 12.3* 11.6*   < > 11.6* 11.6*  11.9* 13.5  HCT 41.3 39.4   < > 34.0* 34.0*  35.0* 45.3  MCV 85.2 84.5  --   --   --  83.7  MCH 25.4* 24.9*  --   --   --  25.0*  MCHC 29.8* 29.4*  --   --   --  29.8*  RDW 14.5 14.4  --   --   --  14.4  PLT 173 156  --   --   --  165   < > = values in this interval not  displayed.    BNPNo results for input(s): BNP, PROBNP in the last 168 hours.   DDimer No results for input(s): DDIMER in the last 168 hours.   Radiology    CARDIAC CATHETERIZATION  Result Date: 08/07/2020  Hemodynamic findings consistent with moderate pulmonary hypertension.  LV end diastolic pressure is severely elevated.  There is no aortic valve stenosis.  SUMMARY  Angiographically normal coronary arteries.  Moderate Secondary Pulmonary Hypertension with PAP-mean of 51/24 mmHg - 37 mmHg; PCWP 30-32 mmHg with LVEDP of 30-35 mmHg.  In setting of known systolic heart failure this to be consistent with COMBINED SYSTOLIC AND DIASTOLIC HEART FAILURE RECOMMENDATIONS  Continue to titrate cardiomyopathy medications, he will be given additional IV Lasix in the holding area.  Needs additional afterload reduction and diuretic.  He will return to [facility] after PRN removal/bedrest Glenetta Hew, MD   Cardiac Studies   Right and left heart cath 08/07/20:  Hemodynamic findings consistent with moderate pulmonary hypertension.  LV end diastolic pressure is severely elevated.  There is no aortic valve stenosis.   SUMMARY  Angiographically normal coronary arteries.  Moderate Secondary Pulmonary Hypertension with PAP-mean of 51/24 mmHg - 37  mmHg; PCWP 30-32 mmHg with LVEDP of 30-35 mmHg.  In setting of known systolic heart failure this to be consistent with COMBINED SYSTOLIC AND DIASTOLIC HEART FAILURE   RECOMMENDATIONS  Continue to titrate cardiomyopathy medications, he will be given additional IV Lasix in the holding area.  Needs additional afterload reduction and diuretic.  He will return to [facility] after PRN removal/bedrest   Echo 08/03/20: 1. Limited contrast echo for LV thrombus evaluation. No evidence of LV  thrombus. Left ventricular ejection fraction, by estimation, is 20 to 25%.  The left ventricle has severely decreased function. The left  ventricle  demonstrates global hypokinesis.    Patient Profile     65 y.o. male with a hx of hypertension, hyperlipidemia, diabetes mellitus and bipolar disorderadmitted with aphasia who is being seen for the evaluation of low EF.  Echocardiogram shows ejection fraction 20 to 25%, severe left ventricular enlargement, mild mitral regurgitation. 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 - heart cath yesterday consistent with pulmonary hypertension, angiographically normal coronaries - heparin gtt stopped - GDMT includes coreg 3.25 mg BID, - did not add ARB/spiro yet because BMP pending - on ASA and statin   Hypertension - pressure elevated overnight, may reconsider addition of entresto instead of ARB once renal function results   CKD stage III - sCr not yet resulted, was 1.57 yesterday - add ARB if renal function stable   Aphasia - no stroke on imaging - seems improved today compared to Monday   Bipolar disorder - continue medications      For questions or updates, please contact [facility] Please consult [email or web address] for contact info under        Signed, Ledora Bottcher, PA  08/08/2020, 9:14 AM   As above, patient seen and examined.  He denies dyspnea or chest pain.  Radial cath site without hematoma.  Cath results noted.  He does not have coronary disease and pulmonary capillary wedge pressure is elevated.  I discussed continuing hospitalization for IV diuresis but he declines stating home must be discharged today.  We will continue carvedilol at present dose.  Add losartan 25 mg daily, digoxin 0.125 mg daily and Lasix 40 mg daily.  Add Jardiance as an outpatient if renal function stable.  He will need potassium and renal function checked on Monday, May 16.  We will arrange follow-up in CHF clinic.  Hopefully we can transition from losartan to [facility]  and add spironolactone.  Beta-blockade can also be advanced.  May need to schedule cardiac MRI following discharge as well.  This was all discussed with the patient and he is in agreement. Kirk Ruths, MD

## 2020-08-09 LAB — GLUCOSE, CAPILLARY
Glucose-Capillary: 116 mg/dL — ABNORMAL HIGH (ref 70–99)
Glucose-Capillary: 131 mg/dL — ABNORMAL HIGH (ref 70–99)

## 2020-08-09 NOTE — Progress Notes (Signed)
Pt discharged to home, instructions reviewed with pt, acknowledged understanding of instructions. Transportation called for patient pickup at 4 pm. Will monitor. SRP,RN

## 2020-08-09 NOTE — Plan of Care (Signed)
  Problem: Health Behavior/Discharge Planning: Goal: Ability to manage health-related needs will improve Outcome: Progressing   Problem: Clinical Measurements: Goal: Ability to maintain clinical measurements within normal limits will improve Outcome: Progressing Goal: Will remain free from infection Outcome: Progressing Goal: Diagnostic test results will improve Outcome: Progressing Goal: Respiratory complications will improve Outcome: Progressing Goal: Cardiovascular complication will be avoided Outcome: Progressing   Problem: Activity: Goal: Risk for activity intolerance will decrease Outcome: Progressing   Problem: Nutrition: Goal: Adequate nutrition will be maintained Outcome: Progressing   Problem: Coping: Goal: Level of anxiety will decrease Outcome: Progressing   Problem: Elimination: Goal: Will not experience complications related to bowel motility Outcome: Progressing Goal: Will not experience complications related to urinary retention Outcome: Progressing   Problem: Pain Managment: Goal: General experience of comfort will improve Outcome: Progressing   Problem: Safety: Goal: Ability to remain free from injury will improve Outcome: Progressing   Problem: Skin Integrity: Goal: Risk for impaired skin integrity will decrease Outcome: Progressing   Problem: Education: Goal: Understanding of CV disease, CV risk reduction, and recovery process will improve Outcome: Progressing Goal: Individualized Educational Video(s) Outcome: Progressing   Problem: Activity: Goal: Ability to return to baseline activity level will improve Outcome: Progressing   Problem: Cardiovascular: Goal: Ability to achieve and maintain adequate cardiovascular perfusion will improve Outcome: Progressing Goal: Vascular access site(s) Level 0-1 will be maintained Outcome: Progressing   Problem: Education: Goal: Knowledge of disease or condition will improve Outcome:  Progressing Goal: Knowledge of secondary prevention will improve Outcome: Progressing

## 2020-08-09 NOTE — Care Management Important Message (Signed)
Important Message  Patient Details IM Letter given to the Patient. Name: CHAISE MAHABIR MRN: [id number] Date of Birth: 1955-07-22   Medicare Important Message Given:  Yes     Kerin Salen 08/09/2020, 12:48 PM

## 2020-08-09 NOTE — Progress Notes (Signed)
Pt removed monitor, refused to reapply. Will update MD. SRP, RN

## 2020-08-09 NOTE — TOC Progression Note (Signed)
Transition of Care [facility]) - Progression Note    Patient Details  Name: Matthew Barnes MRN: [id number] Date of Birth: 12/17/1955  Transition of Care [facility]) CM/SW Contact  Purcell Mouton, RN Phone Number: 08/09/2020, 11:16 AM  Clinical Narrative:    Spoke with pt's sister Matthew Barnes who agreed with pt going home with [facility]. Matthew Barnes was selected. Matthew Barnes asked that brother Matthew Barnes to give update. Matthew Barnes was called, no answer, left VM for Matthew Barnes to return call.    Expected Discharge Plan: [place] Barriers to Discharge: No Barriers Identified  Expected Discharge Plan and Services Expected Discharge Plan: [place] arrangements for the past 2 months: Single Family Home Expected Discharge Date: 08/09/20                                     Social Determinants of Health (SDOH) Interventions    Readmission Risk Interventions No flowsheet data found.

## 2020-08-09 NOTE — Progress Notes (Signed)
Physical Therapy Treatment Patient Details Name: Matthew Barnes MRN: [id number] DOB: 01/26/1956 Today's Date: 08/09/2020    History of Present Illness 65 year old male presents with difficulty finding words and completing sentences. CT head without no acute intracranial abnormality. PMH: bipolar disorder II , diabetes mellitus, hyperlipidemia, hypertension, hypothyroidism.    PT Comments    Pt ambulated in hallway and utilized RW for safety (per his own request).  Pt reports he has walker at home.  Pt eager for d/c.   Follow Up Recommendations  Supervision/Assistance - 24 hour     Equipment Recommendations  None recommended by PT    Recommendations for Other Services       Precautions / Restrictions Precautions Precautions: Fall    Mobility  Bed Mobility               General bed mobility comments: pt sitting on window seat on arrival    Transfers Overall transfer level: Modified independent Equipment used: None                Ambulation/Gait Ambulation/Gait assistance: Supervision Gait Distance (Feet): 400 Feet Assistive device: Rolling walker (2 wheeled) Gait Pattern/deviations: Step-through pattern;Decreased stride length;Wide base of support     General Gait Details: pt reports being mildly unsteady from not mobilizing as much as usual during hospitalization and requested using RW to ambulate (has one at home as well)   Stairs             Wheelchair Mobility    Modified Rankin (Stroke Patients Only)       Balance Overall balance assessment: No apparent balance deficits (not formally assessed)                             High Level Balance Comments: pt utilized RW however not reliant on for support, able to walk backwards 10 feet without LOB            Cognition Arousal/Alertness: Awake/alert Behavior During Therapy: WFL for tasks assessed/performed Overall Cognitive Status: No family/caregiver present to determine  baseline cognitive functioning                                 General Comments: word finding difficulty at times; reports using a home gym at baseline, recalls bari RW in room from earlier, requested using RW today for safety      Exercises      General Comments        Pertinent Vitals/Pain Pain Assessment: No/denies pain    Home Living                      Prior Function            PT Goals (current goals can now be found in the care plan section) Progress towards PT goals: Progressing toward goals    Frequency    Min 3X/week      PT Plan Current plan remains appropriate    Co-evaluation              AM-PAC PT "6 Clicks" Mobility   Outcome Measure  Help needed turning from your back to your side while in a flat bed without using bedrails?: A Little Help needed moving from lying on your back to sitting on the side of a flat bed without using bedrails?: A Little Help needed  moving to and from a bed to a chair (including a wheelchair)?: A Little Help needed standing up from a chair using your arms (e.g., wheelchair or bedside chair)?: A Little Help needed to walk in hospital room?: A Little Help needed climbing 3-5 steps with a railing? : A Little 6 Click Score: 18    End of Session Equipment Utilized During Treatment: Gait belt Activity Tolerance: Patient tolerated treatment well Patient left: with call bell/phone within reach;in chair;with chair alarm set   PT Visit Diagnosis: Difficulty in walking, not elsewhere classified (R26.2)     Time: 9935-7017 PT Time Calculation (min) (ACUTE ONLY): 16 min  Charges:  $Gait Training: 8-22 mins                    Arlyce Dice, DPT Acute Rehabilitation Services Pager: [phone] Office: [phone]   [place] Ram E 08/09/2020, 1:25 PM

## 2020-08-09 NOTE — Plan of Care (Signed)
Problem: Health Behavior/Discharge Planning: Goal: Ability to manage health-related needs will improve 08/09/2020 1328 by Zadie Rhine, RN Outcome: Adequate for Discharge 08/09/2020 1328 by Zadie Rhine, RN Outcome: Progressing   Problem: Clinical Measurements: Goal: Ability to maintain clinical measurements within normal limits will improve 08/09/2020 1328 by Zadie Rhine, RN Outcome: Adequate for Discharge 08/09/2020 1328 by Zadie Rhine, RN Outcome: Progressing Goal: Will remain free from infection 08/09/2020 1328 by Zadie Rhine, RN Outcome: Adequate for Discharge 08/09/2020 1328 by Zadie Rhine, RN Outcome: Progressing Goal: Diagnostic test results will improve 08/09/2020 1328 by Zadie Rhine, RN Outcome: Adequate for Discharge 08/09/2020 1328 by Zadie Rhine, RN Outcome: Progressing Goal: Respiratory complications will improve 08/09/2020 1328 by Zadie Rhine, RN Outcome: Adequate for Discharge 08/09/2020 1328 by Zadie Rhine, RN Outcome: Progressing Goal: Cardiovascular complication will be avoided 08/09/2020 1328 by Zadie Rhine, RN Outcome: Adequate for Discharge 08/09/2020 1328 by Zadie Rhine, RN Outcome: Progressing   Problem: Activity: Goal: Risk for activity intolerance will decrease 08/09/2020 1328 by Zadie Rhine, RN Outcome: Adequate for Discharge 08/09/2020 1328 by Zadie Rhine, RN Outcome: Progressing   Problem: Nutrition: Goal: Adequate nutrition will be maintained 08/09/2020 1328 by Zadie Rhine, RN Outcome: Adequate for Discharge 08/09/2020 1328 by Zadie Rhine, RN Outcome: Progressing   Problem: Coping: Goal: Level of anxiety will decrease 08/09/2020 1328 by Zadie Rhine, RN Outcome: Adequate for Discharge 08/09/2020 1328 by Zadie Rhine, RN Outcome: Progressing   Problem: Elimination: Goal: Will not experience complications related to bowel motility 08/09/2020 1328  by Zadie Rhine, RN Outcome: Adequate for Discharge 08/09/2020 1328 by Zadie Rhine, RN Outcome: Progressing Goal: Will not experience complications related to urinary retention 08/09/2020 1328 by Zadie Rhine, RN Outcome: Adequate for Discharge 08/09/2020 1328 by Zadie Rhine, RN Outcome: Progressing   Problem: Pain Managment: Goal: General experience of comfort will improve 08/09/2020 1328 by Zadie Rhine, RN Outcome: Adequate for Discharge 08/09/2020 1328 by Zadie Rhine, RN Outcome: Progressing   Problem: Safety: Goal: Ability to remain free from injury will improve 08/09/2020 1328 by Zadie Rhine, RN Outcome: Adequate for Discharge 08/09/2020 1328 by Zadie Rhine, RN Outcome: Progressing   Problem: Skin Integrity: Goal: Risk for impaired skin integrity will decrease 08/09/2020 1328 by Zadie Rhine, RN Outcome: Adequate for Discharge 08/09/2020 1328 by Zadie Rhine, RN Outcome: Progressing   Problem: Education: Goal: Understanding of CV disease, CV risk reduction, and recovery process will improve 08/09/2020 1328 by Zadie Rhine, RN Outcome: Adequate for Discharge 08/09/2020 1328 by Zadie Rhine, RN Outcome: Progressing Goal: Individualized Educational Video(s) 08/09/2020 1328 by Zadie Rhine, RN Outcome: Adequate for Discharge 08/09/2020 1328 by Zadie Rhine, RN Outcome: Progressing   Problem: Activity: Goal: Ability to return to baseline activity level will improve 08/09/2020 1328 by Zadie Rhine, RN Outcome: Adequate for Discharge 08/09/2020 1328 by Zadie Rhine, RN Outcome: Progressing   Problem: Cardiovascular: Goal: Ability to achieve and maintain adequate cardiovascular perfusion will improve 08/09/2020 1328 by Zadie Rhine, RN Outcome: Adequate for Discharge 08/09/2020 1328 by Zadie Rhine, RN Outcome: Progressing Goal: Vascular access site(s) Level 0-1 will be  maintained 08/09/2020 1328 by Zadie Rhine, RN Outcome: Adequate for Discharge 08/09/2020 1328 by Zadie Rhine, RN Outcome: Progressing   Problem: Education: Goal: Knowledge of disease or condition will improve 08/09/2020 1328 by Theora Gianotti  R, RN Outcome: Adequate for Discharge 08/09/2020 1328 by Zadie Rhine, RN Outcome: Progressing Goal: Knowledge of secondary prevention will improve 08/09/2020 1328 by Zadie Rhine, RN Outcome: Adequate for Discharge 08/09/2020 1328 by Zadie Rhine, RN Outcome: Progressing

## 2020-08-09 NOTE — Progress Notes (Signed)
D/C to home, tele discontinued. SRP, RN

## 2020-08-10 ENCOUNTER — Telehealth: Payer: Self-pay

## 2020-08-10 NOTE — Telephone Encounter (Signed)
Transition Care Management Unsuccessful Follow-up Telephone Call  Date of discharge and from where:  08/09/2020  [facility]   Attempts:  1st Attempt  Reason for unsuccessful TCM follow-up call:  Left voice message

## 2020-08-12 NOTE — Progress Notes (Incomplete)
No show

## 2020-08-13 ENCOUNTER — Encounter (HOSPITAL_COMMUNITY): Payer: Medicare HMO

## 2020-08-13 ENCOUNTER — Telehealth (HOSPITAL_COMMUNITY): Payer: Self-pay

## 2020-08-13 ENCOUNTER — Encounter (HOSPITAL_COMMUNITY): Payer: Self-pay

## 2020-08-13 NOTE — Telephone Encounter (Signed)
Heart Failure Nurse Navigator Progress Note  Navigator attempted to reach pt regarding HV TOC appt today at 3pm. Unable to leave vm as vm box if full.   Pricilla Holm, RN, BSN Heart Failure Nurse Navigator [phone]

## 2020-08-27 NOTE — Progress Notes (Deleted)
Cardiology Clinic Note   Patient Name: Matthew Barnes Date of Encounter: 08/27/2020  Primary Care Provider:  Caren Macadam, MD Primary Cardiologist:  Quay Burow, MD  Patient Profile    Matthew Barnes 65 year old male presents the clinic today for follow-up evaluation of his combined systolic and diastolic CHF.  Past Medical History    Past Medical History:  Diagnosis Date  . Bipolar II disorder - Managed by Marye Round [phone]) 05/03/2013  . Diabetes mellitus   . Dysphagia   . Hyperlipidemia   . Hypertension   . Unspecified hypothyroidism 05/03/2013   Past Surgical History:  Procedure Laterality Date  . KNEE SURGERY     scope on left knee  . RIGHT/LEFT HEART CATH AND CORONARY ANGIOGRAPHY N/A 08/07/2020   Procedure: RIGHT/LEFT HEART CATH AND CORONARY ANGIOGRAPHY;  Surgeon: Leonie Man, MD;  Location: [facility];  Service: Cardiovascular;  Laterality: N/A;    Allergies  Allergies  Allergen Reactions  . Influenza Vaccines Swelling    Reports of swelling of the arm and then sick for 10 days    History of Present Illness    [name] has a PMH of HTN, HLD, DM, and bipolar disorder.  He was admitted on 07/31/2020 and discharged on 08/09/2020.  He was admitted with aphasia and was seen and evaluated by cardiology for having low ejection fraction.  His echocardiogram showed EF of 20-25%, severe left ventricular enlargement, mild mitral regurgitation.  He underwent left and right cardiac catheterization which showed normal coronary arteries and pulmonary hypertension.  He was placed on carvedilol 3.25 mg twice daily.  He was not started on ARB ACE Arni or spironolactone due to pending BMP.  Creatinine on 08/08/2020 was 1.45 with baseline around 1.30.  He presents to the clinic today for follow-up evaluation states***  *** denies chest pain, shortness of breath, lower extremity edema, fatigue, palpitations, melena, hematuria, hemoptysis, diaphoresis,  weakness, presyncope, syncope, orthopnea, and PND.     Home Medications    Prior to Admission medications   Medication Sig Start Date End Date Taking? Authorizing Provider  atorvastatin (LIPITOR) 20 MG tablet Take 1 tablet (20 mg total) by mouth daily. 08/09/20   Florencia Reasons, MD  Blood Pressure Monitoring (BLOOD PRESSURE CUFF) MISC Use as directed Patient taking differently: 1 each by Other route as directed. 09/02/18   Lucretia Kern, DO  carvedilol (COREG) 6.25 MG tablet Take 1 tablet (6.25 mg total) by mouth 2 (two) times daily with a meal. 08/09/20   Florencia Reasons, MD  digoxin (LANOXIN) 0.125 MG tablet Take 1 tablet (0.125 mg total) by mouth daily. 08/09/20   Florencia Reasons, MD  furosemide (LASIX) 40 MG tablet Take 1 tablet (40 mg total) by mouth daily. 08/09/20   Florencia Reasons, MD  insulin NPH-regular Human (NOVOLIN 70/30) (70-30) 100 UNIT/ML injection Inject 40 Units into the skin 2 (two) times daily with a meal. 08/01/20   Elayne Snare, MD  Insulin Syringe-Needle U-100 (INSULIN SYRINGE 1CC/31GX5/16") 31G X 5/16" 1 ML MISC Use 3 needles per day Patient taking differently: 1 each by Other route in the morning, at noon, and at bedtime. 01/31/14   Elayne Snare, MD  lithium carbonate 300 MG capsule Take 3 capsules (900 mg total) by mouth daily. 04/06/19   Caren Macadam, MD  losartan (COZAAR) 25 MG tablet Take 1 tablet (25 mg total) by mouth daily. 08/09/20   Florencia Reasons, MD  metFORMIN (GLUCOPHAGE) 1000 MG tablet TAKE  1 TABLET BY MOUTH 2 TIMES DAILY WITH A MEAL. Patient taking differently: Take 1,000 mg by mouth 2 (two) times daily with a meal. 11/02/17   Elayne Snare, MD  [facility] VERIO test strip USE AS INSTRUCTED TO CHECK BLOOD SUGAR THREE TIMES A DAY. Patient taking differently: 1 each by Other route in the morning, at noon, and at bedtime. 10/15/17   Lucretia Kern, DO  pantoprazole (PROTONIX) 20 MG tablet TAKE 1 TABLET BY MOUTH TWICE A DAY BEFORE MEALS Patient taking differently: Take 20 mg by mouth 2 (two) times daily  before a meal. 07/06/20   Billie Ruddy, MD  QUEtiapine (SEROQUEL) 200 MG tablet Take 200-300 mg by mouth at bedtime.    [provider]  sildenafil (VIAGRA) 50 MG tablet Take 1 tablet (50 mg total) by mouth daily as needed for erectile dysfunction. Do not take more then once in 24 hours. 04/28/17   Lucretia Kern, DO  thiamine 100 MG tablet Take 1 tablet (100 mg total) by mouth daily. 08/09/20   Florencia Reasons, MD    Family History    Family History  Problem Relation Age of Onset  . Diabetes Mother   . Hypertension Mother   . Heart disease Mother   . Heart disease Father   . Heart attack Father 24  . Diabetes Father   . Hypertension Father   . Depression Father   . Diabetes Sister   . Depression Paternal Grandmother   . Depression Other    He indicated that his mother is alive. He indicated that his father is alive. He indicated that his sister is alive. He indicated that all of his four brothers are alive. He indicated that his maternal grandmother is deceased. He indicated that his maternal grandfather is deceased. He indicated that his paternal grandmother is deceased. He indicated that his paternal grandfather is deceased. He indicated that the status of his other is unknown.  Social History    Social History   Socioeconomic History  . Marital status: Single    Spouse name: Not on file  . Number of children: Not on file  . Years of education: Not on file  . Highest education level: Not on file  Occupational History  . Not on file  Tobacco Use  . Smoking status: Never Smoker  . Smokeless tobacco: Never Used  Substance and Sexual Activity  . Alcohol use: No  . Drug use: No  . Sexual activity: Never  Other Topics Concern  . Not on file  Social History Narrative   Work or School: on disability for knee arthritis      Home Situation: lives alone      Spiritual Beliefs:Christian      Lifestyle:no regular exercising; diet not great            Social  Determinants of Health   Financial Resource Strain: Low Risk   . Difficulty of Paying Living Expenses: Not hard at all  Food Insecurity: No Food Insecurity  . Worried About Charity fundraiser in the Last Year: Never true  . Ran Out of Food in the Last Year: Never true  Transportation Needs: No Transportation Needs  . Lack of Transportation (Medical): No  . Lack of Transportation (Non-Medical): No  Physical Activity: Insufficiently Active  . Days of Exercise per Week: 5 days  . Minutes of Exercise per Session: 20 min  Stress: No Stress Concern Present  . Feeling of Stress : Not at  all  Social Connections: Socially Isolated  . Frequency of Communication with Friends and Family: Three times a week  . Frequency of Social Gatherings with Friends and Family: Never  . Attends Religious Services: Never  . Active Member of Clubs or Organizations: No  . Attends Archivist Meetings: Never  . Marital Status: Never married  Intimate Partner Violence: Not At Risk  . Fear of Current or Ex-Partner: No  . Emotionally Abused: No  . Physically Abused: No  . Sexually Abused: No     Review of Systems    General:  No chills, fever, night sweats or weight changes.  Cardiovascular:  No chest pain, dyspnea on exertion, edema, orthopnea, palpitations, paroxysmal nocturnal dyspnea. Dermatological: No rash, lesions/masses Respiratory: No cough, dyspnea Urologic: No hematuria, dysuria Abdominal:   No nausea, vomiting, diarrhea, bright red blood per rectum, melena, or hematemesis Neurologic:  No visual changes, wkns, changes in mental status. All other systems reviewed and are otherwise negative except as noted above.  Physical Exam    VS:  There were no vitals taken for this visit. , BMI There is no height or weight on file to calculate BMI. GEN: Well nourished, well developed, in no acute distress. HEENT: normal. Neck: Supple, no JVD, carotid bruits, or masses. Cardiac: RRR, no murmurs,  rubs, or gallops. No clubbing, cyanosis, edema.  Radials/DP/PT 2+ and equal bilaterally.  Respiratory:  Respirations regular and unlabored, clear to auscultation bilaterally. GI: Soft, nontender, nondistended, BS + x 4. MS: no deformity or atrophy. Skin: warm and dry, no rash. Neuro:  Strength and sensation are intact. Psych: Normal affect.  Accessory Clinical Findings    Recent Labs: 05/21/2020: TSH 4.41 07/31/2020: B Natriuretic Peptide 372.3 08/04/2020: ALT 30 08/05/2020: Magnesium 2.2 08/07/2020: Hemoglobin 13.5; Platelets 165 08/08/2020: BUN 19; Creatinine, Ser 1.45; Potassium 4.1; Sodium 140   Recent Lipid Panel    Component Value Date/Time   CHOL 116 07/31/2020 0409   TRIG 65 07/31/2020 0409   HDL 24 (L) 07/31/2020 0409   CHOLHDL 4.8 07/31/2020 0409   VLDL 13 07/31/2020 0409   LDLCALC 79 07/31/2020 0409   LDLDIRECT 73.0 09/01/2017 1054    ECG personally reviewed by me today- *** - No acute changes  Echocardiogram 08/01/2020 IMPRESSIONS    1. No subcostal images.  2. Left ventricular ejection fraction, by estimation, is 20 to 25%. The  left ventricle has severely decreased function. The left ventricle  demonstrates global hypokinesis. The left ventricular internal cavity size  was severely dilated. Left ventricular  diastolic parameters are indeterminate.  3. Right ventricular systolic function is normal. The right ventricular  size is normal. There is mildly elevated pulmonary artery systolic  pressure.  4. The pericardial effusion is posterior to the left ventricle.  5. The mitral valve is normal in structure. Mild mitral valve  regurgitation. No evidence of mitral stenosis.  6. The aortic valve is tricuspid. Aortic valve regurgitation is not  visualized. Mild aortic valve sclerosis is present, with no evidence of  aortic valve stenosis.  7. The inferior vena cava is normal in size with greater than 50%  respiratory variability, suggesting right atrial pressure  of 3 mmHg.   FINDINGS  Left Ventricle: Left ventricular ejection fraction, by estimation, is 20  to 25%. The left ventricle has severely decreased function. The left  ventricle demonstrates global hypokinesis. The left ventricular internal  cavity size was severely dilated.  There is no left ventricular hypertrophy. Left ventricular diastolic  parameters  are indeterminate.   Right Ventricle: The right ventricular size is normal. No increase in  right ventricular wall thickness. Right ventricular systolic function is  normal. There is mildly elevated pulmonary artery systolic pressure. The  tricuspid regurgitant velocity is 3.03  m/s, and with an assumed right atrial pressure of 3 mmHg, the estimated  right ventricular systolic pressure is 41.7 mmHg.   Left Atrium: Left atrial size was normal in size.   Right Atrium: Right atrial size was normal in size.   Pericardium: Trivial pericardial effusion is present. The pericardial  effusion is posterior to the left ventricle.   Mitral Valve: The mitral valve is normal in structure. Mild mitral valve  regurgitation. No evidence of mitral valve stenosis.   Tricuspid Valve: The tricuspid valve is normal in structure. Tricuspid  valve regurgitation is mild . No evidence of tricuspid stenosis.   Aortic Valve: The aortic valve is tricuspid. Aortic valve regurgitation is  not visualized. Mild aortic valve sclerosis is present, with no evidence  of aortic valve stenosis.   Pulmonic Valve: The pulmonic valve was normal in structure. Pulmonic valve  regurgitation is not visualized. No evidence of pulmonic stenosis.   Aorta: The aortic root is normal in size and structure.   Venous: The inferior vena cava is normal in size with greater than 50%  respiratory variability, suggesting right atrial pressure of 3 mmHg.   IAS/Shunts: The interatrial septum was not well visualized.   Echocardiogram 08/03/2020 IMPRESSIONS    1. Limited contrast  echo for LV thrombus evaluation. No evidence of LV  thrombus. Left ventricular ejection fraction, by estimation, is 20 to 25%.  The left ventricle has severely decreased function. The left ventricle  demonstrates global hypokinesis.   Conclusion(s)/Recommendation(s): No left ventricular mural or apical  thrombus/thrombi.   FINDINGS  Left Ventricle: Limited contrast echo for LV thrombus evaluation. No  evidence of LV thrombus. Left ventricular ejection fraction, by  estimation, is 20 to 25%. The left ventricle has severely decreased  function. The left ventricle demonstrates global  hypokinesis. Definity contrast agent was given IV to delineate the left  ventricular endocardial borders.   Cardiac catheterization 08/07/2020  Hemodynamic findings consistent with moderate pulmonary hypertension.  LV end diastolic pressure is severely elevated.  There is no aortic valve stenosis.   SUMMARY  Angiographically normal coronary arteries.  Moderate Secondary Pulmonary Hypertension with PAP-mean of 51/24 mmHg - 37 mmHg; PCWP 30-32 mmHg with LVEDP of 30-35 mmHg.  In setting of known systolic heart failure this to be consistent with COMBINED SYSTOLIC AND DIASTOLIC HEART FAILURE   RECOMMENDATIONS  Continue to titrate cardiomyopathy medications, he will be given additional IV Lasix in the holding area.  Needs additional afterload reduction and diuretic.  He will return to [facility] after PRN removal/bedrest   Glenetta Hew, MD  Diagnostic Dominance: Right    Intervention    Assessment & Plan   1.  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  Euvolemic.  Echocardiogram showed an EF of 25- 20%, no LV thrombus, and global hypokinesis.  ACE ARB Arni/spironolactone not added due to pending BMP.  Creatinine elevated at 1.45 with a baseline of 1.30 Continue carvedilol, aspirin, statin Repeat BMP in 1  week Start Entresto 2426  Essential hypertension- BP today***.  Well-controlled at home. Continue carvedilol Heart healthy low-sodium diet-salty 6 given Increase physical activity as tolerated Start Entresto  CKD stage III- elevated to 1.57 during admission.  Baseline around 1.30 Order BMP Follows with PCP  Aphasia- no evidence of CVA on imaging.  No further episodes of neurological deficit.  Neurologically intact. Continue statin, aspirin Heart healthy low-sodium high-fiber diet Follows with PCP  Disposition: Follow-up with Dr. Gwenlyn Found in 1 month.   Jossie Ng. Remi Lopata NP-C    08/27/2020, 3:31 PM [facility] [place] [address] Office [phone] Fax [phone]  Notice: This dictation was prepared with Dragon dictation along with smaller phrase technology. Any transcriptional errors that result from this process are unintentional and may not be corrected upon review.  I spent***minutes examining this patient, reviewing medications, and using patient centered shared decision making involving her cardiac care.  Prior to her visit I spent greater than 20 minutes reviewing her past medical history,  medications, and prior cardiac tests.

## 2020-08-28 ENCOUNTER — Ambulatory Visit: Payer: Medicare HMO | Admitting: General Practice

## 2020-09-03 ENCOUNTER — Telehealth: Payer: Self-pay | Admitting: Family Medicine

## 2020-09-03 NOTE — Telephone Encounter (Signed)
Pt is calling in to see if he could get a sooner appointment than the one he has scheduled for a hospital follow-up on 10/05/2020 at 2:00

## 2020-09-05 NOTE — Telephone Encounter (Signed)
I would be ok adding to 1:30 slot June 20th if we have staff to do that. Can you please let front know if we have cancellations to hold those spots for same day or hospital follow up so we have something to offer patients until those same day spots are restored?

## 2020-09-08 ENCOUNTER — Other Ambulatory Visit: Payer: Self-pay | Admitting: Family Medicine

## 2020-10-01 ENCOUNTER — Emergency Department (HOSPITAL_COMMUNITY)
Admission: EM | Admit: 2020-10-01 | Discharge: 2020-10-01 | Discharge status: Against Medical Advice | Payer: Medicare HMO | Attending: Emergency Medicine | Admitting: Emergency Medicine

## 2020-10-01 ENCOUNTER — Other Ambulatory Visit: Payer: Self-pay

## 2020-10-01 ENCOUNTER — Emergency Department (HOSPITAL_COMMUNITY): Payer: Medicare HMO

## 2020-10-01 ENCOUNTER — Encounter (HOSPITAL_COMMUNITY): Payer: Self-pay | Admitting: Emergency Medicine

## 2020-10-01 DIAGNOSIS — N189 Chronic kidney disease, unspecified: Secondary | ICD-10-CM

## 2020-10-01 DIAGNOSIS — Z79899 Other long term (current) drug therapy: Secondary | ICD-10-CM | POA: Insufficient documentation

## 2020-10-01 DIAGNOSIS — I429 Cardiomyopathy, unspecified: Secondary | ICD-10-CM

## 2020-10-01 DIAGNOSIS — I130 Hypertensive heart and chronic kidney disease with heart failure and stage 1 through stage 4 chronic kidney disease, or unspecified chronic kidney disease: Secondary | ICD-10-CM | POA: Insufficient documentation

## 2020-10-01 DIAGNOSIS — Z20822 Contact with and (suspected) exposure to covid-19: Secondary | ICD-10-CM | POA: Diagnosis not present

## 2020-10-01 DIAGNOSIS — R079 Chest pain, unspecified: Secondary | ICD-10-CM | POA: Diagnosis not present

## 2020-10-01 DIAGNOSIS — E039 Hypothyroidism, unspecified: Secondary | ICD-10-CM | POA: Diagnosis not present

## 2020-10-01 DIAGNOSIS — E1122 Type 2 diabetes mellitus with diabetic chronic kidney disease: Secondary | ICD-10-CM | POA: Diagnosis not present

## 2020-10-01 DIAGNOSIS — I509 Heart failure, unspecified: Secondary | ICD-10-CM | POA: Diagnosis not present

## 2020-10-01 DIAGNOSIS — I5023 Acute on chronic systolic (congestive) heart failure: Secondary | ICD-10-CM | POA: Insufficient documentation

## 2020-10-01 DIAGNOSIS — Z7984 Long term (current) use of oral hypoglycemic drugs: Secondary | ICD-10-CM | POA: Insufficient documentation

## 2020-10-01 DIAGNOSIS — Z794 Long term (current) use of insulin: Secondary | ICD-10-CM | POA: Diagnosis not present

## 2020-10-01 DIAGNOSIS — R0602 Shortness of breath: Secondary | ICD-10-CM | POA: Diagnosis not present

## 2020-10-01 DIAGNOSIS — J811 Chronic pulmonary edema: Secondary | ICD-10-CM | POA: Diagnosis not present

## 2020-10-01 DIAGNOSIS — R110 Nausea: Secondary | ICD-10-CM | POA: Diagnosis not present

## 2020-10-01 LAB — CBC
HCT: 44.9 % (ref 39.0–52.0)
Hemoglobin: 13.2 g/dL (ref 13.0–17.0)
MCH: 24.9 pg — ABNORMAL LOW (ref 26.0–34.0)
MCHC: 29.4 g/dL — ABNORMAL LOW (ref 30.0–36.0)
MCV: 84.7 fL (ref 80.0–100.0)
Platelets: 174 10*3/uL (ref 150–400)
RBC: 5.3 MIL/uL (ref 4.22–5.81)
RDW: 16.2 % — ABNORMAL HIGH (ref 11.5–15.5)
WBC: 6.6 10*3/uL (ref 4.0–10.5)
nRBC: 0 % (ref 0.0–0.2)

## 2020-10-01 LAB — BASIC METABOLIC PANEL
Anion gap: 9 (ref 5–15)
BUN: 16 mg/dL (ref 8–23)
CO2: 26 mmol/L (ref 22–32)
Calcium: 9.3 mg/dL (ref 8.9–10.3)
Chloride: 103 mmol/L (ref 98–111)
Creatinine, Ser: 1.57 mg/dL — ABNORMAL HIGH (ref 0.61–1.24)
GFR, Estimated: 49 mL/min — ABNORMAL LOW (ref >60)
Glucose, Bld: 150 mg/dL — ABNORMAL HIGH (ref 70–99)
Potassium: 4.5 mmol/L (ref 3.5–5.1)
Sodium: 138 mmol/L (ref 135–145)

## 2020-10-01 LAB — LITHIUM LEVEL: Lithium Lvl: 0.88 mmol/L (ref 0.60–1.20)

## 2020-10-01 LAB — BRAIN NATRIURETIC PEPTIDE: B Natriuretic Peptide: 406.4 pg/mL — ABNORMAL HIGH (ref 0.0–100.0)

## 2020-10-01 LAB — DIGOXIN LEVEL: Digoxin Level: <0.2 ng/mL — ABNORMAL LOW (ref 0.8–2.0)

## 2020-10-01 LAB — TROPONIN I (HIGH SENSITIVITY): Troponin I (High Sensitivity): 20 ng/L — ABNORMAL HIGH (ref <18)

## 2020-10-01 IMAGING — CR DG CHEST 2V
2 series · 2 of 2 positions shown · non-contrast
Comparison: [DATE]

CLINICAL DATA: Chest pain and shortness of breath.  Nausea.

EXAM:
CHEST - 2 VIEW

[w chest lat]
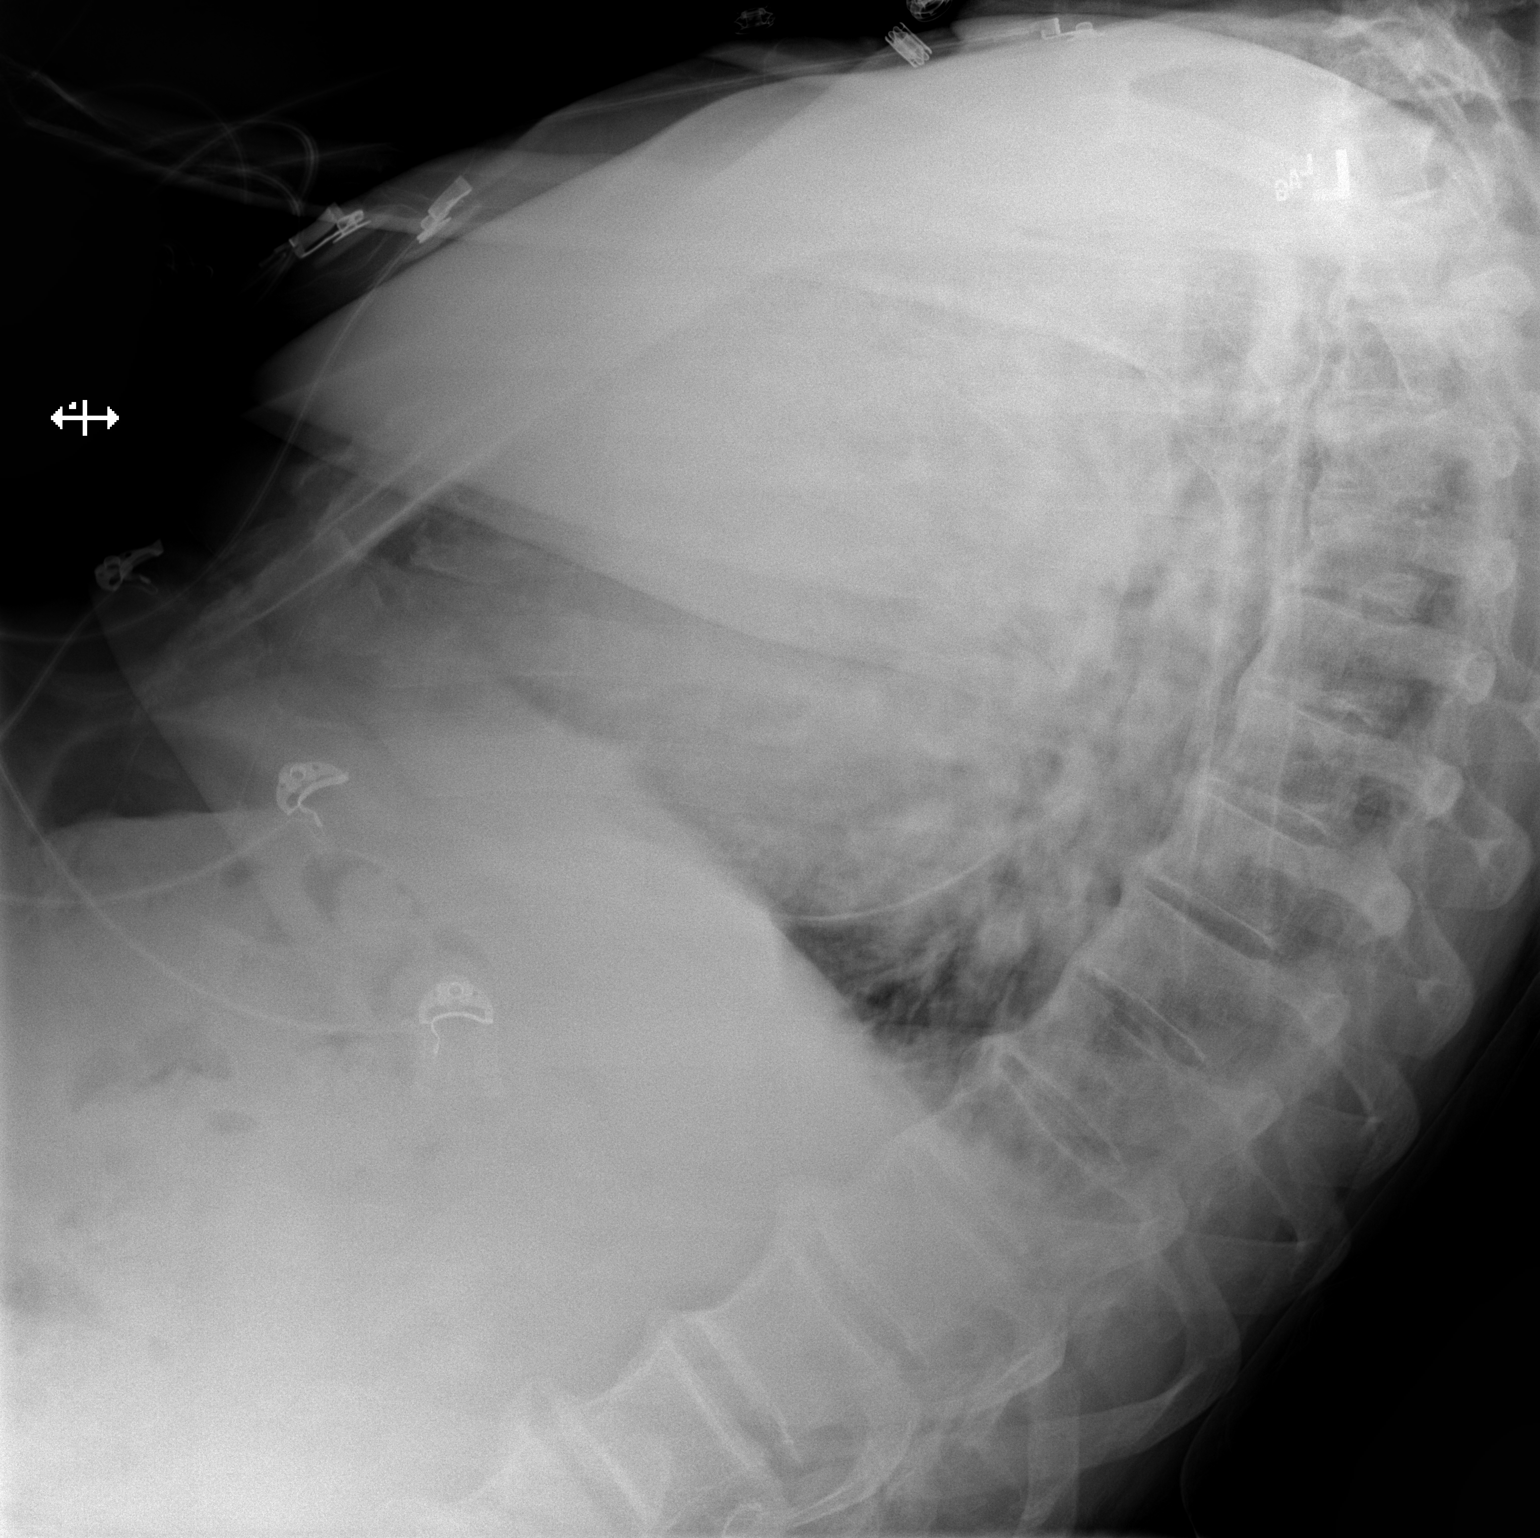

[x chest ap]
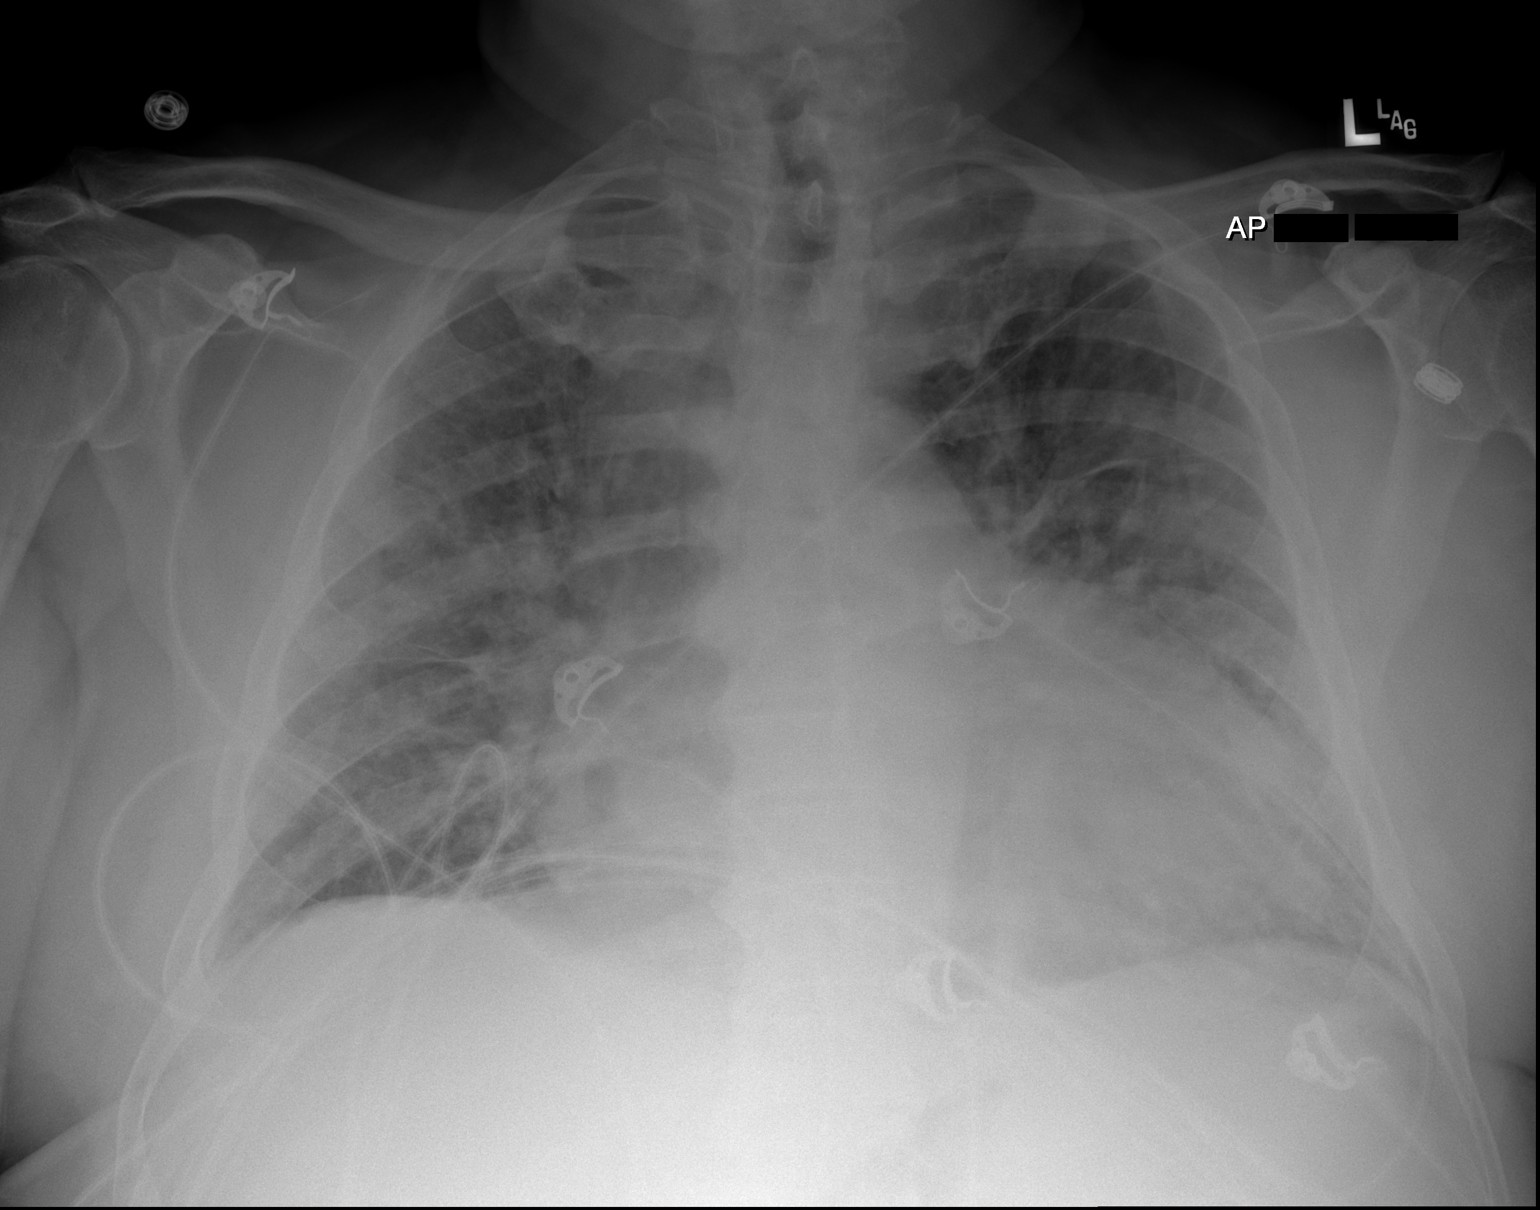

[2 of 2 positions shown; findings below may reference images not displayed]

FINDINGS: Slightly shallow inspiration. Cardiac enlargement with pulmonary
vascular congestion. Perihilar infiltrates, likely edema. No pleural
effusions. No pneumothorax. Mediastinal contours appear intact.
IMPRESSION: Cardiac enlargement with mild vascular congestion and perihilar
edema.

## 2020-10-01 MED ORDER — FUROSEMIDE 10 MG/ML IJ SOLN: 40.0000 mg | Freq: Once | INTRAMUSCULAR | Status: DC

## 2020-10-01 MED ORDER — FUROSEMIDE 40 MG PO TABS: 40.0000 mg | ORAL_TABLET | Freq: Every day | ORAL | 0 refills | Status: DC

## 2020-10-01 NOTE — ED Notes (Signed)
Pt not agreeable to stay to be admitted to the hospital. Dr. Melina Copa aware and spoke to pt.

## 2020-10-01 NOTE — Discharge Instructions (Addendum)
You were seen in the emergency department for chest pain shortness of breath and swelling in your abdomen and legs.  You are in congestive heart failure.  I recommend that you are admitted to the hospital so we can help with your symptoms.  You refused admission and are signing out [place].  Please restart your cardiac medications and follow-up with your cardiologist.  Return to the emergency department for any worsening or concerning symptoms

## 2020-10-01 NOTE — ED Triage Notes (Signed)
Patient arrives complaining of middle chest pain that radiates to the right, shortness of breath, and abdominal distention that has worsened over the past week. Patient states that he has not taken any of his medications prescribed since having a heart cath in May due to them making him nauseated. Patient noted to be moderately distressed, with difficulty speaking in full sentences.

## 2020-10-01 NOTE — ED Provider Notes (Signed)
[facility] DEPT Provider Note   CSN: [id number] Arrival date & time: 10/01/20  2014     History Chief Complaint  Patient presents with   Chest Pain   Shortness of Breath    Matthew Barnes is a 65 y.o. male.  He has a history of congestive heart failure dilated cardiomyopathy.  He recently had a cardiac catheter that did not show any obstructive disease.  Last EF was 20 to 25%.  He is complaining of dyspnea on exertion edema of his legs and abdomen and right-sided chest that is been going on for few weeks.  The history is provided by the patient.  Shortness of Breath Severity:  Moderate Onset quality:  Gradual Duration:  2 weeks Timing:  Constant Progression:  Worsening Chronicity:  Recurrent Relieved by:  Nothing Worsened by:  Activity Ineffective treatments:  Rest Associated symptoms: chest pain and cough   Associated symptoms: no abdominal pain, no fever, no headaches, no hemoptysis, no neck pain, no rash, no sore throat, no vomiting and no wheezing   Chest pain:    Quality: pressure     Severity:  Moderate   Onset quality:  Gradual   Duration:  2 weeks   Timing:  Intermittent   Progression:  Unchanged   Chronicity:  Recurrent     Past Medical History:  Diagnosis Date   Bipolar II disorder - Managed by Marye Round [phone]) 05/03/2013   Diabetes mellitus    Dysphagia    Hyperlipidemia    Hypertension    Unspecified hypothyroidism 05/03/2013    Patient Active Problem List   Diagnosis Date Noted   Dilated cardiomyopathy ([place])    Elevated troponin level not due to acute coronary syndrome    Acute on chronic systolic CHF (congestive heart failure) ([place]) 08/06/2020   AMS (altered mental status) 07/31/2020   Aphasia 07/31/2020   Difficulty with speech 07/30/2020   Chest pain 03/26/2018   Dyspnea on exertion 03/26/2018   Family history of heart disease 03/26/2018   Leg edema 10/27/2016   Bipolar affective, mixed ([place])  04/29/2016   Esophageal reflux 01/17/2014   Type 2 diabetes mellitus with diabetic neuropathy, with long-term current use of insulin ([place]) 08/31/2013   Hypertension associated with diabetes ([place]) 05/03/2013   Hyperlipidemia 05/03/2013   Hypothyroidism 05/03/2013    Past Surgical History:  Procedure Laterality Date   KNEE SURGERY     scope on left knee   RIGHT/LEFT HEART CATH AND CORONARY ANGIOGRAPHY N/A 08/07/2020   Procedure: RIGHT/LEFT HEART CATH AND CORONARY ANGIOGRAPHY;  Surgeon: Leonie Man, MD;  Location: [facility];  Service: Cardiovascular;  Laterality: N/A;       Family History  Problem Relation Age of Onset   Diabetes Mother    Hypertension Mother    Heart disease Mother    Heart disease Father    Heart attack Father 33   Diabetes Father    Hypertension Father    Depression Father    Diabetes Sister    Depression Paternal Grandmother    Depression Other     Social History   Tobacco Use   Smoking status: Never   Smokeless tobacco: Never  Substance Use Topics   Alcohol use: No   Drug use: No    Home Medications Prior to Admission medications   Medication Sig Start Date End Date Taking? Authorizing Provider  atorvastatin (LIPITOR) 20 MG tablet Take 1 tablet (20 mg total) by mouth daily. 08/09/20  Florencia Reasons, MD  Blood Pressure Monitoring (BLOOD PRESSURE CUFF) MISC Use as directed Patient taking differently: 1 each by Other route as directed. 09/02/18   Lucretia Kern, DO  carvedilol (COREG) 6.25 MG tablet Take 1 tablet (6.25 mg total) by mouth 2 (two) times daily with a meal. 08/09/20   Florencia Reasons, MD  digoxin (LANOXIN) 0.125 MG tablet Take 1 tablet (0.125 mg total) by mouth daily. 08/09/20   Florencia Reasons, MD  furosemide (LASIX) 40 MG tablet Take 1 tablet (40 mg total) by mouth daily. 08/09/20   Florencia Reasons, MD  insulin NPH-regular Human (NOVOLIN 70/30) (70-30) 100 UNIT/ML injection Inject 40 Units into the skin 2 (two) times daily with a meal. 08/01/20   Elayne Snare, MD  Insulin Syringe-Needle U-100 (INSULIN SYRINGE 1CC/31GX5/16") 31G X 5/16" 1 ML MISC Use 3 needles per day Patient taking differently: 1 each by Other route in the morning, at noon, and at bedtime. 01/31/14   Elayne Snare, MD  lithium carbonate 300 MG capsule Take 3 capsules (900 mg total) by mouth daily. 04/06/19   Caren Macadam, MD  losartan (COZAAR) 25 MG tablet Take 1 tablet (25 mg total) by mouth daily. 08/09/20   Florencia Reasons, MD  metFORMIN (GLUCOPHAGE) 1000 MG tablet TAKE 1 TABLET BY MOUTH 2 TIMES DAILY WITH A MEAL. Patient taking differently: Take 1,000 mg by mouth 2 (two) times daily with a meal. 11/02/17   Elayne Snare, MD  [facility] VERIO test strip USE AS INSTRUCTED TO CHECK BLOOD SUGAR THREE TIMES A DAY. Patient taking differently: 1 each by Other route in the morning, at noon, and at bedtime. 10/15/17   Lucretia Kern, DO  pantoprazole (PROTONIX) 20 MG tablet TAKE 1 TABLET BY MOUTH TWICE A DAY BEFORE MEALS Patient taking differently: Take 20 mg by mouth 2 (two) times daily before a meal. 07/06/20   Billie Ruddy, MD  QUEtiapine (SEROQUEL) 200 MG tablet Take 200-300 mg by mouth at bedtime.    [provider]  sildenafil (VIAGRA) 50 MG tablet Take 1 tablet (50 mg total) by mouth daily as needed for erectile dysfunction. Do not take more then once in 24 hours. 04/28/17   Lucretia Kern, DO  thiamine 100 MG tablet Take 1 tablet (100 mg total) by mouth daily. 08/09/20   Florencia Reasons, MD    Allergies    Influenza vaccines  Review of Systems   Review of Systems  Constitutional:  Negative for fever.  HENT:  Negative for sore throat.   Eyes:  Negative for visual disturbance.  Respiratory:  Positive for cough and shortness of breath. Negative for hemoptysis and wheezing.   Cardiovascular:  Positive for chest pain and leg swelling.  Gastrointestinal:  Negative for abdominal pain and vomiting.  Genitourinary:  Negative for dysuria.  Musculoskeletal:  Negative for neck pain.  Skin:   Negative for rash.  Neurological:  Negative for headaches.   Physical Exam Updated Vital Signs BP (!) 167/114 (BP Location: Left Arm)   Pulse (!) 114   Temp 98.2 F (36.8 C) (Oral)   Resp 19   SpO2 98%   Physical Exam Vitals and nursing note reviewed.  Constitutional:      Appearance: Normal appearance. He is well-developed. He is obese.  HENT:     Head: Normocephalic and atraumatic.  Eyes:     Conjunctiva/sclera: Conjunctivae normal.  Cardiovascular:     Rate and Rhythm: Normal rate and regular rhythm.  Heart sounds: Normal heart sounds. No murmur heard. Pulmonary:     Effort: Pulmonary effort is normal. Tachypnea present. No respiratory distress.     Breath sounds: Normal breath sounds.  Abdominal:     Palpations: Abdomen is soft.     Tenderness: There is no abdominal tenderness. There is no guarding or rebound.  Musculoskeletal:        General: Normal range of motion.     Cervical back: Neck supple.     Right lower leg: Edema present.     Left lower leg: Edema present.  Skin:    General: Skin is warm and dry.  Neurological:     General: No focal deficit present.     Mental Status: He is alert.    ED Results / Procedures / Treatments   Labs (all labs ordered are listed, but only abnormal results are displayed) Labs Reviewed  BASIC METABOLIC PANEL - Abnormal; Notable for the following components:      Result Value   Glucose, Bld 150 (*)    Creatinine, Ser 1.57 (*)    GFR, Estimated 49 (*)    All other components within normal limits  CBC - Abnormal; Notable for the following components:   MCH 24.9 (*)    MCHC 29.4 (*)    RDW 16.2 (*)    All other components within normal limits  BRAIN NATRIURETIC PEPTIDE - Abnormal; Notable for the following components:   B Natriuretic Peptide 406.4 (*)    All other components within normal limits  DIGOXIN LEVEL - Abnormal; Notable for the following components:   Digoxin Level <0.2 (*)    All other components within  normal limits  TROPONIN I (HIGH SENSITIVITY) - Abnormal; Notable for the following components:   Troponin I (High Sensitivity) 20 (*)    All other components within normal limits  SARS CORONAVIRUS 2 (TAT 6-24 HRS)  LITHIUM LEVEL  TROPONIN I (HIGH SENSITIVITY)    EKG EKG Interpretation  Date/Time:  Monday October 01 2020 20:24:38 EDT Ventricular Rate:  112 PR Interval:  172 QRS Duration: 96 QT Interval:  338 QTC Calculation: 461 R Axis:   -9 Text Interpretation: Sinus tachycardia Possible Left atrial enlargement Nonspecific T wave abnormality Abnormal ECG No significant change since prior 5/22 Confirmed by Aletta Edouard [phone]) on 10/01/2020 8:42:14 PM  Radiology DG Chest 2 View  Result Date: 10/01/2020 CLINICAL DATA:  Chest pain and shortness of breath.  Nausea. EXAM: CHEST - 2 VIEW COMPARISON:  07/30/2020 FINDINGS: Slightly shallow inspiration. Cardiac enlargement with pulmonary vascular congestion. Perihilar infiltrates, likely edema. No pleural effusions. No pneumothorax. Mediastinal contours appear intact. IMPRESSION: Cardiac enlargement with mild vascular congestion and perihilar edema. Electronically Signed   By: Lucienne Capers M.D.   On: 10/01/2020 21:36    Procedures Procedures   Medications Ordered in ED Medications - No data to display  ED Course  I have reviewed the triage vital signs and the nursing notes.  Pertinent labs & imaging results that were available during my care of the patient were reviewed by me and considered in my medical decision making (see chart for details).  Clinical Course as of 10/02/20 1051  Mon Oct 01, 2020  2139 Chest x-ray interpreted by me as cardiomegaly and CHF moderate [MB]  2201 Had a long discussion with the patient's about the results of his testing and his need for admission for IV diuresis.  He understands that he has a failing heart but he says he has  to work tomorrow and cannot be admitted.  He has not been taking his medications  because he says they make him drowsy.  I told him flat out that if he does not take his medications he will likely die.  He is able to voice this back to me and seems to comprehend this.  He is asking to be discharged.  He is signing out [place]. [MB]    Clinical Course User Index [MB] Hayden Rasmussen, MD   MDM Rules/Calculators/A&P                         This patient complains of shortness of breath, edema of legs and abdomen; this involves an extensive number of treatment Options and is a complaint that carries with it a high risk of complications and Morbidity. The differential includes CHF, COPD, ACS, PE, pneumonia, pneumothorax, COVID  I ordered, reviewed and interpreted labs, which included CBC with normal white count normal hemoglobin, chemistries with elevated creatinine, BNP elevated, troponin elevated will need to be trended I ordered medication IV Lasix which patient declined I ordered imaging studies which included chest x-ray and I independently    visualized and interpreted imaging which showed cardiomegaly and CHF  Previous records obtained and reviewed in epic including prior cardiology notes  After the interventions stated above, I reevaluated the patient and found patient still to be symptomatic.  Remains tachycardic tachypneic although not hypoxic.  He is refusing admission to the hospital for further management of his CHF and other symptoms.  He is signing out [place].  Encourage patient to return to the hospital for further treatment if he changes his mind.   Final Clinical Impression(s) / ED Diagnoses Final diagnoses:  Acute congestive heart failure, unspecified heart failure type ([place])  Cardiomyopathy, unspecified type ([place])  Chronic kidney disease, unspecified CKD stage    Rx / DC Orders ED Discharge Orders     None        Hayden Rasmussen, MD 10/02/20 1054

## 2020-10-02 ENCOUNTER — Encounter (HOSPITAL_COMMUNITY): Payer: Self-pay

## 2020-10-02 ENCOUNTER — Other Ambulatory Visit: Payer: Self-pay

## 2020-10-02 ENCOUNTER — Emergency Department (HOSPITAL_COMMUNITY)
Admission: EM | Admit: 2020-10-02 | Discharge: 2020-10-02 | Disposition: A | Discharge status: Home / Self Care | Payer: Medicare HMO | Attending: Emergency Medicine | Admitting: Emergency Medicine

## 2020-10-02 ENCOUNTER — Emergency Department (HOSPITAL_COMMUNITY): Payer: Medicare HMO

## 2020-10-02 DIAGNOSIS — E1140 Type 2 diabetes mellitus with diabetic neuropathy, unspecified: Secondary | ICD-10-CM | POA: Insufficient documentation

## 2020-10-02 DIAGNOSIS — Z7984 Long term (current) use of oral hypoglycemic drugs: Secondary | ICD-10-CM | POA: Insufficient documentation

## 2020-10-02 DIAGNOSIS — Z794 Long term (current) use of insulin: Secondary | ICD-10-CM | POA: Diagnosis not present

## 2020-10-02 DIAGNOSIS — R0602 Shortness of breath: Secondary | ICD-10-CM | POA: Diagnosis present

## 2020-10-02 DIAGNOSIS — I5023 Acute on chronic systolic (congestive) heart failure: Secondary | ICD-10-CM | POA: Insufficient documentation

## 2020-10-02 DIAGNOSIS — I110 Hypertensive heart disease with heart failure: Secondary | ICD-10-CM | POA: Insufficient documentation

## 2020-10-02 DIAGNOSIS — Z955 Presence of coronary angioplasty implant and graft: Secondary | ICD-10-CM | POA: Diagnosis not present

## 2020-10-02 DIAGNOSIS — Z79899 Other long term (current) drug therapy: Secondary | ICD-10-CM | POA: Diagnosis not present

## 2020-10-02 DIAGNOSIS — E039 Hypothyroidism, unspecified: Secondary | ICD-10-CM | POA: Diagnosis not present

## 2020-10-02 DIAGNOSIS — I5020 Unspecified systolic (congestive) heart failure: Secondary | ICD-10-CM

## 2020-10-02 DIAGNOSIS — I509 Heart failure, unspecified: Secondary | ICD-10-CM | POA: Diagnosis not present

## 2020-10-02 LAB — CBC WITH DIFFERENTIAL/PLATELET
Abs Immature Granulocytes: 0.01 10*3/uL (ref 0.00–0.07)
Basophils Absolute: 0 10*3/uL (ref 0.0–0.1)
Basophils Relative: 1 %
Eosinophils Absolute: 0.3 10*3/uL (ref 0.0–0.5)
Eosinophils Relative: 5 %
HCT: 39.7 % (ref 39.0–52.0)
Hemoglobin: 11.8 g/dL — ABNORMAL LOW (ref 13.0–17.0)
Immature Granulocytes: 0 %
Lymphocytes Relative: 26 %
Lymphs Abs: 1.6 10*3/uL (ref 0.7–4.0)
MCH: 25.1 pg — ABNORMAL LOW (ref 26.0–34.0)
MCHC: 29.7 g/dL — ABNORMAL LOW (ref 30.0–36.0)
MCV: 84.5 fL (ref 80.0–100.0)
Monocytes Absolute: 0.4 10*3/uL (ref 0.1–1.0)
Monocytes Relative: 7 %
Neutro Abs: 3.6 10*3/uL (ref 1.7–7.7)
Neutrophils Relative %: 61 %
Platelets: 135 10*3/uL — ABNORMAL LOW (ref 150–400)
RBC: 4.7 MIL/uL (ref 4.22–5.81)
RDW: 15.9 % — ABNORMAL HIGH (ref 11.5–15.5)
WBC: 5.9 10*3/uL (ref 4.0–10.5)
nRBC: 0 % (ref 0.0–0.2)

## 2020-10-02 LAB — SARS CORONAVIRUS 2 (TAT 6-24 HRS): SARS Coronavirus 2: NEGATIVE

## 2020-10-02 LAB — BASIC METABOLIC PANEL
Anion gap: 7 (ref 5–15)
BUN: 20 mg/dL (ref 8–23)
CO2: 24 mmol/L (ref 22–32)
Calcium: 9.1 mg/dL (ref 8.9–10.3)
Chloride: 106 mmol/L (ref 98–111)
Creatinine, Ser: 1.53 mg/dL — ABNORMAL HIGH (ref 0.61–1.24)
GFR, Estimated: 50 mL/min — ABNORMAL LOW (ref >60)
Glucose, Bld: 119 mg/dL — ABNORMAL HIGH (ref 70–99)
Potassium: 4.6 mmol/L (ref 3.5–5.1)
Sodium: 137 mmol/L (ref 135–145)

## 2020-10-02 LAB — TROPONIN I (HIGH SENSITIVITY)
Troponin I (High Sensitivity): 15 ng/L (ref <18)
Troponin I (High Sensitivity): 14 ng/L (ref <18)

## 2020-10-02 LAB — HEPATIC FUNCTION PANEL
ALT: 17 U/L (ref 0–44)
AST: 28 U/L (ref 15–41)
Albumin: 4.1 g/dL (ref 3.5–5.0)
Alkaline Phosphatase: 39 U/L (ref 38–126)
Bilirubin, Direct: 0.2 mg/dL (ref 0.0–0.2)
Indirect Bilirubin: 0.5 mg/dL (ref 0.3–0.9)
Total Bilirubin: 0.7 mg/dL (ref 0.3–1.2)
Total Protein: 6.9 g/dL (ref 6.5–8.1)

## 2020-10-02 LAB — BRAIN NATRIURETIC PEPTIDE: B Natriuretic Peptide: 379 pg/mL — ABNORMAL HIGH (ref 0.0–100.0)

## 2020-10-02 MED ORDER — FUROSEMIDE 10 MG/ML IJ SOLN
40.0000 mg | Freq: Once | INTRAMUSCULAR | Status: AC
  Administered 2020-10-02: 40 mg via INTRAVENOUS
  Filled 2020-10-02: qty 4

## 2020-10-02 NOTE — ED Notes (Signed)
Pt ambulated to the bathroom, approx 15 to 20 ft Upon return, pt was dyspneic Pt placed on pulse Ox   V/S SpO2 87% RA R- 22  Pt rested and O2 returned to normal 94%

## 2020-10-02 NOTE — ED Provider Notes (Signed)
[facility] DEPT Provider Note   CSN: [id number] Arrival date & time: 10/02/20  1619     History Chief Complaint  Patient presents with   Shortness of Breath    NOLLAN MULDROW is a 65 y.o. male.  Patient has a history of congestive heart failure.  He comes in here with some shortness of breath.  He was seen yesterday with mild congestive heart failure but refused admission  The history is provided by the patient. No language interpreter was used.  Shortness of Breath Severity:  Moderate Onset quality:  Sudden Duration: 1 week. Timing:  Constant Progression:  Worsening Chronicity:  Recurrent Context: activity   Relieved by:  Nothing Worsened by:  Nothing Ineffective treatments:  None tried Associated symptoms: no abdominal pain, no chest pain, no cough, no headaches and no rash       Past Medical History:  Diagnosis Date   Bipolar II disorder - Managed by Marye Round [phone]) 05/03/2013   Diabetes mellitus    Dysphagia    Hyperlipidemia    Hypertension    Unspecified hypothyroidism 05/03/2013    Patient Active Problem List   Diagnosis Date Noted   Dilated cardiomyopathy ([place])    Elevated troponin level not due to acute coronary syndrome    Acute on chronic systolic CHF (congestive heart failure) ([place]) 08/06/2020   AMS (altered mental status) 07/31/2020   Aphasia 07/31/2020   Difficulty with speech 07/30/2020   Chest pain 03/26/2018   Dyspnea on exertion 03/26/2018   Family history of heart disease 03/26/2018   Leg edema 10/27/2016   Bipolar affective, mixed ([place]) 04/29/2016   Esophageal reflux 01/17/2014   Type 2 diabetes mellitus with diabetic neuropathy, with long-term current use of insulin ([place]) 08/31/2013   Hypertension associated with diabetes ([place]) 05/03/2013   Hyperlipidemia 05/03/2013   Hypothyroidism 05/03/2013    Past Surgical History:  Procedure Laterality Date   KNEE SURGERY     scope on left knee    RIGHT/LEFT HEART CATH AND CORONARY ANGIOGRAPHY N/A 08/07/2020   Procedure: RIGHT/LEFT HEART CATH AND CORONARY ANGIOGRAPHY;  Surgeon: Leonie Man, MD;  Location: [facility];  Service: Cardiovascular;  Laterality: N/A;       Family History  Problem Relation Age of Onset   Diabetes Mother    Hypertension Mother    Heart disease Mother    Heart disease Father    Heart attack Father 41   Diabetes Father    Hypertension Father    Depression Father    Diabetes Sister    Depression Paternal Grandmother    Depression Other     Social History   Tobacco Use   Smoking status: Never   Smokeless tobacco: Never  Substance Use Topics   Alcohol use: No   Drug use: No    Home Medications Prior to Admission medications   Medication Sig Start Date End Date Taking? Authorizing Provider  atorvastatin (LIPITOR) 20 MG tablet Take 1 tablet (20 mg total) by mouth daily. 08/09/20   Florencia Reasons, MD  Blood Pressure Monitoring (BLOOD PRESSURE CUFF) MISC Use as directed Patient taking differently: 1 each by Other route as directed. 09/02/18   Lucretia Kern, DO  carvedilol (COREG) 6.25 MG tablet Take 1 tablet (6.25 mg total) by mouth 2 (two) times daily with a meal. 08/09/20   Florencia Reasons, MD  digoxin (LANOXIN) 0.125 MG tablet Take 1 tablet (0.125 mg total) by mouth daily. 08/09/20   Erlinda Hong,  Annamaria Boots, MD  furosemide (LASIX) 40 MG tablet Take 1 tablet (40 mg total) by mouth daily. 10/01/20   Hayden Rasmussen, MD  insulin NPH-regular Human (NOVOLIN 70/30) (70-30) 100 UNIT/ML injection Inject 40 Units into the skin 2 (two) times daily with a meal. 08/01/20   Elayne Snare, MD  Insulin Syringe-Needle U-100 (INSULIN SYRINGE 1CC/31GX5/16") 31G X 5/16" 1 ML MISC Use 3 needles per day Patient taking differently: 1 each by Other route in the morning, at noon, and at bedtime. 01/31/14   Elayne Snare, MD  lithium carbonate 300 MG capsule Take 3 capsules (900 mg total) by mouth daily. Patient taking differently: Take 1,200 mg by  mouth daily. 04/06/19   Caren Macadam, MD  losartan (COZAAR) 25 MG tablet Take 1 tablet (25 mg total) by mouth daily. 08/09/20   Florencia Reasons, MD  metFORMIN (GLUCOPHAGE) 1000 MG tablet TAKE 1 TABLET BY MOUTH 2 TIMES DAILY WITH A MEAL. Patient taking differently: Take 1,000 mg by mouth 2 (two) times daily with a meal. 11/02/17   Elayne Snare, MD  [facility] VERIO test strip USE AS INSTRUCTED TO CHECK BLOOD SUGAR THREE TIMES A DAY. Patient taking differently: 1 each by Other route in the morning, at noon, and at bedtime. 10/15/17   Lucretia Kern, DO  pantoprazole (PROTONIX) 20 MG tablet TAKE 1 TABLET BY MOUTH TWICE A DAY BEFORE MEALS Patient taking differently: Take 20 mg by mouth 2 (two) times daily before a meal. 07/06/20   Billie Ruddy, MD  QUEtiapine (SEROQUEL) 200 MG tablet Take 400 mg by mouth at bedtime.    [provider]  sildenafil (VIAGRA) 50 MG tablet Take 1 tablet (50 mg total) by mouth daily as needed for erectile dysfunction. Do not take more then once in 24 hours. 04/28/17   Lucretia Kern, DO  thiamine 100 MG tablet Take 1 tablet (100 mg total) by mouth daily. 08/09/20   Florencia Reasons, MD    Allergies    Influenza vaccines  Review of Systems   Review of Systems  Constitutional:  Negative for appetite change and fatigue.  HENT:  Negative for congestion, ear discharge and sinus pressure.   Eyes:  Negative for discharge.  Respiratory:  Positive for shortness of breath. Negative for cough.   Cardiovascular:  Negative for chest pain.  Gastrointestinal:  Negative for abdominal pain and diarrhea.  Genitourinary:  Negative for frequency and hematuria.  Musculoskeletal:  Negative for back pain.  Skin:  Negative for rash.  Neurological:  Negative for seizures and headaches.  Psychiatric/Behavioral:  Negative for hallucinations.    Physical Exam Updated Vital Signs BP (!) 151/107   Pulse 100   Temp 98 F (36.7 C) (Oral)   Resp 15   SpO2 92%   Physical Exam Vitals and nursing  note reviewed.  Constitutional:      Appearance: He is well-developed.  HENT:     Head: Normocephalic.     Mouth/Throat:     Mouth: Mucous membranes are moist.  Eyes:     General: No scleral icterus.    Conjunctiva/sclera: Conjunctivae normal.  Neck:     Thyroid: No thyromegaly.  Cardiovascular:     Rate and Rhythm: Normal rate and regular rhythm.     Heart sounds: No murmur heard.   No friction rub. No gallop.  Pulmonary:     Breath sounds: No stridor. No wheezing or rales.  Chest:     Chest wall: No tenderness.  Abdominal:  General: There is no distension.     Tenderness: There is no abdominal tenderness. There is no rebound.  Musculoskeletal:        General: Normal range of motion.     Cervical back: Neck supple.  Lymphadenopathy:     Cervical: No cervical adenopathy.  Skin:    Findings: No erythema or rash.  Neurological:     Mental Status: He is alert and oriented to person, place, and time.     Motor: No abnormal muscle tone.     Coordination: Coordination normal.  Psychiatric:        Behavior: Behavior normal.    ED Results / Procedures / Treatments   Labs (all labs ordered are listed, but only abnormal results are displayed) Labs Reviewed  BASIC METABOLIC PANEL - Abnormal; Notable for the following components:      Result Value   Glucose, Bld 119 (*)    Creatinine, Ser 1.53 (*)    GFR, Estimated 50 (*)    All other components within normal limits  CBC WITH DIFFERENTIAL/PLATELET - Abnormal; Notable for the following components:   Hemoglobin 11.8 (*)    MCH 25.1 (*)    MCHC 29.7 (*)    RDW 15.9 (*)    Platelets 135 (*)    All other components within normal limits  BRAIN NATRIURETIC PEPTIDE - Abnormal; Notable for the following components:   B Natriuretic Peptide 379.0 (*)    All other components within normal limits  HEPATIC FUNCTION PANEL  TROPONIN I (HIGH SENSITIVITY)  TROPONIN I (HIGH SENSITIVITY)    EKG None  Radiology DG Chest 2  View  Result Date: 10/01/2020 CLINICAL DATA:  Chest pain and shortness of breath.  Nausea. EXAM: CHEST - 2 VIEW COMPARISON:  07/30/2020 FINDINGS: Slightly shallow inspiration. Cardiac enlargement with pulmonary vascular congestion. Perihilar infiltrates, likely edema. No pleural effusions. No pneumothorax. Mediastinal contours appear intact. IMPRESSION: Cardiac enlargement with mild vascular congestion and perihilar edema. Electronically Signed   By: Lucienne Capers M.D.   On: 10/01/2020 21:36    Procedures Procedures   Medications Ordered in ED Medications  furosemide (LASIX) injection 40 mg (40 mg Intravenous Given 10/02/20 2154)    ED Course  I have reviewed the triage vital signs and the nursing notes.  Pertinent labs & imaging results that were available during my care of the patient were reviewed by me and considered in my medical decision making (see chart for details). Patient in mild congestive heart failure.  He decided he did not want to be admitted.  He also refused chest x-ray.  Patient was given 40 Lasix IV and he felt somewhat better.  We increased his Lasix to 80 a day and he will follow-up with cardiology   MDM Rules/Calculators/A&P                          Patient with mild congestive heart failure.  His Lasix has been increased to 80 mg a day. Final Clinical Impression(s) / ED Diagnoses Final diagnoses:  Systolic congestive heart failure, unspecified HF chronicity ([place])    Rx / DC Orders ED Discharge Orders     None        Milton Ferguson, MD 10/02/20 2202

## 2020-10-02 NOTE — ED Provider Notes (Signed)
Emergency Medicine Provider Triage Evaluation Note  Matthew Barnes , a 65 y.o. male  was evaluated in triage.  Pt complains of continued shortness of breath and peripheral edema.  He was seen and evaluated yesterday but left AMA because he did not want to be admitted.  States that he is back to seek treatment.  He had a heart catheterization in the past and states that his symptoms improved after this but have now recurred.  Review of Systems  Positive: Shortness of breath, edema Negative: Chest pain  Physical Exam  BP (!) 172/103 (BP Location: Left Arm)   Pulse (!) 104   Temp 98 F (36.7 C) (Oral)   Resp 18   SpO2 94%  Gen:   Awake, no distress   Resp:  Normal effort  MSK:   Moves extremities without difficulty  Other:  Pitting edema to bilateral lower extremity  Medical Decision Making  Medically screening exam initiated at 4:43 PM.  Appropriate orders placed.  [name] was informed that the remainder of the evaluation will be completed by another provider, this initial triage assessment does not replace that evaluation, and the importance of remaining in the ED until their evaluation is complete.  Lab work ordered and patient will be room soon   Delia Heady, Hershal Coria 10/02/20 [place]    Fredia Sorrow, MD 10/05/20 [phone]

## 2020-10-02 NOTE — Discharge Instructions (Addendum)
Increased your Lasix so you are taking 80 mg a day.  Follow-up with your doctors next week for recheck and return sooner if problems

## 2020-10-02 NOTE — ED Triage Notes (Signed)
Pt reports chest pain, SHOB, and abdominal distention that has become worse over the past week. Pt was here last night, but left AMA because he did not want to be admitted. Pt is back and agrees to being admitted.

## 2020-10-04 ENCOUNTER — Other Ambulatory Visit: Payer: Self-pay

## 2020-10-04 DIAGNOSIS — F3132 Bipolar disorder, current episode depressed, moderate: Secondary | ICD-10-CM | POA: Diagnosis not present

## 2020-10-05 ENCOUNTER — Ambulatory Visit (INDEPENDENT_AMBULATORY_CARE_PROVIDER_SITE_OTHER): Payer: Medicare HMO | Admitting: Family Medicine

## 2020-10-05 ENCOUNTER — Encounter: Payer: Self-pay | Admitting: Family Medicine

## 2020-10-05 VITALS — BP 110/68 | HR 94 | Temp 98.3°F | Ht 73.0 in | Wt 289.4 lb

## 2020-10-05 DIAGNOSIS — I5023 Acute on chronic systolic (congestive) heart failure: Secondary | ICD-10-CM

## 2020-10-05 DIAGNOSIS — R1310 Dysphagia, unspecified: Secondary | ICD-10-CM

## 2020-10-05 DIAGNOSIS — I152 Hypertension secondary to endocrine disorders: Secondary | ICD-10-CM

## 2020-10-05 DIAGNOSIS — F3160 Bipolar disorder, current episode mixed, unspecified: Secondary | ICD-10-CM

## 2020-10-05 DIAGNOSIS — E039 Hypothyroidism, unspecified: Secondary | ICD-10-CM | POA: Diagnosis not present

## 2020-10-05 DIAGNOSIS — Z794 Long term (current) use of insulin: Secondary | ICD-10-CM

## 2020-10-05 DIAGNOSIS — E7800 Pure hypercholesterolemia, unspecified: Secondary | ICD-10-CM | POA: Diagnosis not present

## 2020-10-05 DIAGNOSIS — E1140 Type 2 diabetes mellitus with diabetic neuropathy, unspecified: Secondary | ICD-10-CM

## 2020-10-05 DIAGNOSIS — E1159 Type 2 diabetes mellitus with other circulatory complications: Secondary | ICD-10-CM | POA: Diagnosis not present

## 2020-10-05 DIAGNOSIS — Z1211 Encounter for screening for malignant neoplasm of colon: Secondary | ICD-10-CM

## 2020-10-05 DIAGNOSIS — R600 Localized edema: Secondary | ICD-10-CM | POA: Diagnosis not present

## 2020-10-05 MED ORDER — ATORVASTATIN CALCIUM 20 MG PO TABS: 20.0000 mg | ORAL_TABLET | Freq: Every day | ORAL | 1 refills | Status: DC

## 2020-10-05 MED ORDER — PANTOPRAZOLE SODIUM 20 MG PO TBEC: 20.0000 mg | DELAYED_RELEASE_TABLET | Freq: Two times a day (BID) | ORAL | 1 refills | Status: DC

## 2020-10-05 MED ORDER — DIGOXIN 125 MCG PO TABS: 0.1250 mg | ORAL_TABLET | Freq: Every day | ORAL | 1 refills | Status: DC

## 2020-10-05 MED ORDER — LOSARTAN POTASSIUM 25 MG PO TABS: 25.0000 mg | ORAL_TABLET | Freq: Every day | ORAL | 1 refills | Status: DC

## 2020-10-05 MED ORDER — SHINGRIX 50 MCG/0.5ML IM SUSR: 0.5000 mL | Freq: Once | INTRAMUSCULAR | 0 refills | Status: AC

## 2020-10-05 MED ORDER — CARVEDILOL 6.25 MG PO TABS
6.2500 mg | ORAL_TABLET | Freq: Two times a day (BID) | ORAL | 1 refills | Status: DC
Start: 2020-10-05 — End: 2020-11-06

## 2020-10-05 NOTE — Progress Notes (Signed)
Matthew Barnes DOB: 07/14/1955 Encounter date: 10/05/2020  This is a 65 y.o. male who presents with Chief Complaint  Patient presents with   Hospitalization Follow-up    History of present illness:  Also admitted in may for aphasia: in on 07/30/20-08/09/20. He did not like this experience of being in the hospital so long. Felt like he didn't get feedback about why he was there. MRI at that time normal brain (5/6) and MRA normal.   Emergency room visit 7/4 and 7/5.  Admission for heart failure suggested on 7/4 but patient did not want to be admitted.  Admits to never starting to take the lasix he was prescribed. Return to emergency room on 7/5 and was given 40 mg of IV Lasix.  Daily Lasix was increased to 80 mg. He understands importance of medication now and is taking regularly; won't stop.  Last visit with me in the office was 06/2019.  Type 2 diabetes: Followed by Dr. Dwyane Dee.  Feels that he has done a good job of managing this.    Hypertension: he is not sure what he is taking; just puts med in pill box and takes daily.   Bipolar 2 disorder: On Seroquel 400 mg at bedtime, lithium 900mg  daily.  Sees therapist and psychiatrist regularly. Feels like mood is doing good.    Hyperlipidemia: taking lipitor and doing well with this.    [facility].  Allergies  Allergen Reactions   Influenza Vaccines Swelling and Other (See Comments)    Reports of swelling of the arm and then was "sick" for 10 days   Current Meds  Medication Sig   Blood Pressure Monitoring (BLOOD PRESSURE CUFF) MISC Use as directed (Patient taking differently: 1 each by Other route as directed.)   furosemide (LASIX) 40 MG tablet Take 1 tablet (40 mg total) by mouth daily.   insulin NPH-regular Human (NOVOLIN 70/30) (70-30) 100 UNIT/ML injection Inject 40 Units into the skin 2 (two) times daily with a meal.   Insulin Syringe-Needle U-100 (INSULIN SYRINGE 1CC/31GX5/16") 31G X 5/16" 1 ML MISC Use 3 needles per  day (Patient taking differently: 1 each by Other route in the morning, at noon, and at bedtime.)   lithium carbonate 300 MG capsule Take 3 capsules (900 mg total) by mouth daily. (Patient taking differently: Take 1,200 mg by mouth daily.)   ONETOUCH VERIO test strip USE AS INSTRUCTED TO CHECK BLOOD SUGAR THREE TIMES A DAY. (Patient taking differently: 1 each by Other route in the morning, at noon, and at bedtime.)   QUEtiapine (SEROQUEL) 200 MG tablet Take 400 mg by mouth at bedtime.   sildenafil (VIAGRA) 50 MG tablet Take 1 tablet (50 mg total) by mouth daily as needed for erectile dysfunction. Do not take more then once in 24 hours.   thiamine 100 MG tablet Take 1 tablet (100 mg total) by mouth daily.   [EXPIRED] Zoster Vaccine Adjuvanted [facility]) injection Inject 0.5 mLs into the muscle once for 1 dose. Repeat in 2-6 months   [DISCONTINUED] atorvastatin (LIPITOR) 20 MG tablet Take 1 tablet (20 mg total) by mouth daily.   [DISCONTINUED] carvedilol (COREG) 6.25 MG tablet Take 1 tablet (6.25 mg total) by mouth 2 (two) times daily with a meal.   [DISCONTINUED] digoxin (LANOXIN) 0.125 MG tablet Take 1 tablet (0.125 mg total) by mouth daily.   [DISCONTINUED] losartan (COZAAR) 25 MG tablet Take 1 tablet (25 mg total) by mouth daily.   [DISCONTINUED] metFORMIN (GLUCOPHAGE) 1000 MG tablet  TAKE 1 TABLET BY MOUTH 2 TIMES DAILY WITH A MEAL. (Patient taking differently: Take 1,000 mg by mouth 2 (two) times daily with a meal.)   [DISCONTINUED] pantoprazole (PROTONIX) 20 MG tablet TAKE 1 TABLET BY MOUTH TWICE A DAY BEFORE MEALS (Patient taking differently: Take 20 mg by mouth 2 (two) times daily before a meal.)    Review of Systems  Constitutional:  Negative for activity change, appetite change, chills, fatigue, fever and unexpected weight change.  HENT:  Negative for congestion, ear pain, hearing loss, sinus pressure, sinus pain, sore throat and trouble swallowing.   Eyes:  Negative for pain and visual  disturbance.  Respiratory:  Negative for cough, chest tightness, shortness of breath and wheezing.   Cardiovascular:  Positive for leg swelling (states still some swelling but significantly improved). Negative for chest pain and palpitations.  Gastrointestinal:  Negative for abdominal distention, abdominal pain, blood in stool, constipation, diarrhea, nausea and vomiting.  Genitourinary:  Negative for decreased urine volume, difficulty urinating, dysuria, penile pain and testicular pain.  Musculoskeletal:  Negative for arthralgias, back pain and joint swelling.  Skin:  Negative for rash.  Neurological:  Negative for dizziness, weakness, numbness and headaches.  Hematological:  Negative for adenopathy. Does not bruise/bleed easily.  Psychiatric/Behavioral:  Negative for agitation, sleep disturbance and suicidal ideas. The patient is not nervous/anxious.    Objective:  BP 110/68 (BP Location: Left Arm, Patient Position: Sitting, Cuff Size: Large)   Pulse 94   Temp 98.3 F (36.8 C) (Oral)   Ht 6\' 1"  (1.854 m)   Wt 289 lb 6.4 oz (131.3 kg)   SpO2 96%   BMI 38.18 kg/m   Weight: 289 lb 6.4 oz (131.3 kg)   BP Readings from Last 3 Encounters:  10/05/20 110/68  10/02/20 (!) 145/95  10/01/20 (!) 167/114   Wt Readings from Last 3 Encounters:  10/05/20 289 lb 6.4 oz (131.3 kg)  08/05/20 285 lb 2 oz (129.3 kg)  06/04/20 291 lb 6.4 oz (132.2 kg)    Physical Exam Constitutional:      General: He is not in acute distress.    Appearance: He is well-developed.  Cardiovascular:     Rate and Rhythm: Normal rate and regular rhythm.     Heart sounds: Normal heart sounds. No murmur heard.   No friction rub.  Pulmonary:     Effort: Pulmonary effort is normal. No respiratory distress.     Breath sounds: Normal breath sounds. No wheezing or rales.  Musculoskeletal:     Right lower leg: 1+ Pitting Edema present.     Left lower leg: 1+ Pitting Edema present.  Neurological:     Mental Status: He  is alert and oriented to person, place, and time.  Psychiatric:        Behavior: Behavior normal.    Assessment/Plan  1. Hypertension associated with diabetes ([place]) Blood pressure well controlled today.  Continue current medications.  I have asked him to look over his med list at home and make sure that our medications are matching up with each other. - losartan (COZAAR) 25 MG tablet; Take 1 tablet (25 mg total) by mouth daily.  Dispense: 90 tablet; Refill: 1  2. Acute on chronic systolic CHF (congestive heart failure) ([place]) He never started taking Lasix when initially prescribed, but is taking Lasix now.  He is urinating a lot with the 40 mg.  Advised he could try breaking in half and staying at this dose as long as he  feels he is getting good diuresis with this.  We discussed elevation of legs above heart level to help with fluid management and we discussed wearing compression stockings, especially since his daily work involves prolonged sitting (driving) - digoxin (LANOXIN) 0.125 MG tablet; Take 1 tablet (0.125 mg total) by mouth daily.  Dispense: 90 tablet; Refill: 1 - carvedilol (COREG) 6.25 MG tablet; Take 1 tablet (6.25 mg total) by mouth 2 (two) times daily with a meal.  Dispense: 120 tablet; Refill: 1  3. Type 2 diabetes mellitus with diabetic neuropathy, with long-term current use of insulin ([facility]) Is following regularly with endocrinology, Dr. Dwyane Dee.  He is compliant with 70/30 insulin 40 units twice daily.  4. Pure hypercholesterolemia Has been stable with Lipitor.  Continue this medication. - atorvastatin (LIPITOR) 20 MG tablet; Take 1 tablet (20 mg total) by mouth daily.  Dispense: 90 tablet; Refill: 1  5. Bipolar affective, mixed ([place]) Mood is better than it has been in the past.  Continue follow-up with therapy and psychiatry.  6. Hypothyroidism, unspecified type Thyroid has been stable.  He was taken off of Synthroid by endocrinology.  7. Dysphagia, unspecified type Acid  reflux is been stable with Protonix 20 mg twice daily. - pantoprazole (PROTONIX) 20 MG tablet; Take 1 tablet (20 mg total) by mouth 2 (two) times daily before a meal. TAKE 1 TABLET BY MOUTH TWICE A DAY BEFORE MEALS  Dispense: 180 tablet; Refill: 1  8. Colon cancer screening He is due for repeat Cologuard and prefers this method for colon cancer screening. - Cologuard  9. Bilateral lower extremity edema See above.   Return in about 3 months (around 01/05/2021) for Chronic condition visit.      Micheline Rough, MD

## 2020-10-05 NOTE — Patient Instructions (Addendum)
*  could consider trying breaking furosemide in half and seeing if 20mg  still helps get fluid out. This may help get fluid out without over-treating. If you take the 20mg  and don't urinate significantly with this then return to the 40mg .   Let me know how you do with the above; and if the 20mg  works for you we can send in new rx for this.   Look over medicine list and make sure we have everything you are taking and they match up exactly. Let me know if any discrepancies (either call or mychart).

## 2020-11-01 ENCOUNTER — Other Ambulatory Visit: Payer: Self-pay

## 2020-11-01 ENCOUNTER — Encounter: Payer: Self-pay | Admitting: Endocrinology

## 2020-11-01 ENCOUNTER — Ambulatory Visit (INDEPENDENT_AMBULATORY_CARE_PROVIDER_SITE_OTHER): Payer: Medicare HMO | Admitting: Endocrinology

## 2020-11-01 VITALS — BP 142/82 | HR 86 | Ht 73.0 in | Wt 288.0 lb

## 2020-11-01 DIAGNOSIS — E1165 Type 2 diabetes mellitus with hyperglycemia: Secondary | ICD-10-CM

## 2020-11-01 DIAGNOSIS — I420 Dilated cardiomyopathy: Secondary | ICD-10-CM | POA: Diagnosis not present

## 2020-11-01 DIAGNOSIS — Z8639 Personal history of other endocrine, nutritional and metabolic disease: Secondary | ICD-10-CM

## 2020-11-01 DIAGNOSIS — N289 Disorder of kidney and ureter, unspecified: Secondary | ICD-10-CM | POA: Diagnosis not present

## 2020-11-01 DIAGNOSIS — Z794 Long term (current) use of insulin: Secondary | ICD-10-CM

## 2020-11-01 LAB — POCT GLYCOSYLATED HEMOGLOBIN (HGB A1C): Hemoglobin A1C: 7.3 % — AB (ref 4.0–5.6)

## 2020-11-01 LAB — POCT GLUCOSE (DEVICE FOR HOME USE): POC Glucose: 273 mg/dl — AB (ref 70–99)

## 2020-11-01 MED ORDER — "INSULIN SYRINGE 31G X 5/16"" 1 ML MISC": 3 refills | Status: DC

## 2020-11-01 MED ORDER — EMPAGLIFLOZIN 10 MG PO TABS: 10.0000 mg | ORAL_TABLET | Freq: Every day | ORAL | 3 refills | Status: DC

## 2020-11-01 NOTE — Patient Instructions (Addendum)
Metfomin 1 in am daily with Jardiance  Take 44 units for am insulin  Check blood sugars on waking up 5 days a week  Also check blood sugars about 2 hours after meals and do this after different meals by rotation  Recommended blood sugar levels on waking up are 90-130 and about 2 hours after meal is 130-180  Please bring your blood sugar monitor to each visit, thank you   With Jardiance take 1/2 Lasix daily  See Dr Glenetta Hew

## 2020-11-01 NOTE — Progress Notes (Signed)
Patient ID: Matthew Barnes, male   DOB: [birth year], 65 y.o.   MRN: [id number]    Reason for Appointment: Followup for Type 2 Diabetes    History of Present Illness:          Diagnosis: Type 2 diabetes mellitus, date of diagnosis:   2011       Past history:  He was started on metformin at diagnosis and apparently did have fairly good control initially However about a year later because of poor control he was given insulin in addition. He had been taking Lantus insulin until about 2/15, up to 80 units a day However he does not think his blood sugars  Were controlled with this Because of higher A1c of 13.1% he was referred here for diabetes management in 6/15; was started on glucose monitoring and insulin was changed from low dose Levemir to Humalog mix 70 units a day He was also started on Victoza  to help with blood sugar control and facilitate weight loss on his initial consultation  Recent history:   INSULIN regimen is described as: Novolin mix 70/30, 40 units at breakfast, 40 units in p.m  Oral hypoglycemic drugs the patient is taking are: None, previously on metformin 1 g twice a day  A1c is 7.3 but has been as low as 5.8 and in 5/22 was 8.3   Current blood sugar patterns, management of diabetes and problems: His blood sugars are not being checked for several weeks He did not take his insulin this morning his blood sugar is 278 even without taking lunch today Lab glucose in the ER last month was 150 or below  He says he takes the insulin in the morning and evening fairly regularly although may occasionally forget in the morning  When he was hospitalized he did not restart his metformin in 5/22  His diet has been inconsistent, this morning he had a pop tart  Weight is about the same  Currently not exercising  No hypoglycemic symptoms also    Side effects from medications have been: None  Glucose monitoring:  done about 1 times a day or less       Glucometer:  Verio   Blood  Glucose readings not available  Previous readings:     PRE-MEAL  a.m. Lunch Dinner  evening Overall  Glucose range:  139-253    148-239  112-253  Mean/median:  159  148   190  173      Glycemic control:   Lab Results  Component Value Date   HGBA1C 7.3 (A) 11/01/2020   HGBA1C 8.3 (H) 07/31/2020   HGBA1C 6.3 05/21/2020   Lab Results  Component Value Date   MICROALBUR 1.1 07/14/2019   LDLCALC 79 07/31/2020   CREATININE 1.53 (H) 10/02/2020    Self-care: The diet that the patient has been following is: None, unable to control portions and carbs  when depressed  Meals: 3 meals per day (dinner 6 pm-10 pm)        Dietician visit: Most recent: never      CDE visit: 08/2013             Weight history: baseline 295  Wt Readings from Last 3 Encounters:  11/01/20 288 lb (130.6 kg)  10/05/20 289 lb 6.4 oz (131.3 kg)  08/05/20 285 lb 2 oz (129.3 kg)   Office Visit on 11/01/2020  Component Date Value Ref Range Status   Hemoglobin A1C 11/01/2020 7.3 (A) 4.0 - 5.6 % Final  POC Glucose 11/01/2020 273 (A) 70 - 99 mg/dl Final      Allergies as of 11/01/2020       Reactions   Influenza Vaccines Swelling, Other (See Comments)   Reports of swelling of the arm and then was "sick" for 10 days        Medication List        Accurate as of November 01, 2020  1:17 PM. If you have any questions, ask your nurse or doctor.          atorvastatin 20 MG tablet Commonly known as: LIPITOR Take 1 tablet (20 mg total) by mouth daily.   Blood Pressure Cuff Misc Use as directed What changed:  how much to take how to take this when to take this additional instructions   carvedilol 6.25 MG tablet Commonly known as: COREG Take 1 tablet (6.25 mg total) by mouth 2 (two) times daily with a meal.   digoxin 0.125 MG tablet Commonly known as: LANOXIN Take 1 tablet (0.125 mg total) by mouth daily.   empagliflozin 10 MG Tabs tablet Commonly known as: Jardiance Take 1 tablet (10 mg  total) by mouth daily with breakfast. Started by: Elayne Snare, MD   furosemide 40 MG tablet Commonly known as: LASIX Take 1 tablet (40 mg total) by mouth daily.   INSULIN SYRINGE 1CC/31GX5/16" 31G X 5/16" 1 ML Misc Use 3 needles per day What changed:  how much to take how to take this when to take this additional instructions   lithium carbonate 300 MG capsule Take 3 capsules (900 mg total) by mouth daily. What changed: how much to take   losartan 25 MG tablet Commonly known as: COZAAR Take 1 tablet (25 mg total) by mouth daily.   NovoLIN 70/30 (70-30) 100 UNIT/ML injection Generic drug: insulin NPH-regular Human Inject 40 Units into the skin 2 (two) times daily with a meal.   OneTouch Verio test strip Generic drug: glucose blood USE AS INSTRUCTED TO CHECK BLOOD SUGAR THREE TIMES A DAY. What changed: See the new instructions.   pantoprazole 20 MG tablet Commonly known as: PROTONIX Take 1 tablet (20 mg total) by mouth 2 (two) times daily before a meal. TAKE 1 TABLET BY MOUTH TWICE A DAY BEFORE MEALS   QUEtiapine 200 MG tablet Commonly known as: SEROQUEL Take 400 mg by mouth at bedtime.   sildenafil 50 MG tablet Commonly known as: Viagra Take 1 tablet (50 mg total) by mouth daily as needed for erectile dysfunction. Do not take more then once in 24 hours.   thiamine 100 MG tablet Take 1 tablet (100 mg total) by mouth daily.        Allergies:  Allergies  Allergen Reactions   Influenza Vaccines Swelling and Other (See Comments)    Reports of swelling of the arm and then was "sick" for 10 days    Past Medical History:  Diagnosis Date   Bipolar II disorder - Managed by Marye Round [phone]) 05/03/2013   Diabetes mellitus    Dysphagia    Hyperlipidemia    Hypertension    Unspecified hypothyroidism 05/03/2013    Past Surgical History:  Procedure Laterality Date   KNEE SURGERY     scope on left knee   RIGHT/LEFT HEART CATH AND CORONARY ANGIOGRAPHY  N/A 08/07/2020   Procedure: RIGHT/LEFT HEART CATH AND CORONARY ANGIOGRAPHY;  Surgeon: Leonie Man, MD;  Location: [facility];  Service: Cardiovascular;  Laterality: N/A;    Family History  Problem Relation Age of Onset   Diabetes Mother    Hypertension Mother    Heart disease Mother    Heart disease Father    Heart attack Father 21   Diabetes Father    Hypertension Father    Depression Father    Diabetes Sister    Depression Paternal Grandmother    Depression Other     Social History:  reports that he has never smoked. He has never used smokeless tobacco. He reports that he does not drink alcohol and does not use drugs.    Review of Systems   HYPERTENSION:  He is taking losartan 25 mg and carvedilol 6.25 mg only Blood pressure readings as follows:  BP Readings from Last 3 Encounters:  11/01/20 (!) 142/82  10/05/20 110/68  10/02/20 (!) 145/95   Is having consistently higher creatinine levels, does not take any OTC nonsteroidal drugs No previous history of microalbuminuria  Lab Results  Component Value Date   CREATININE 1.53 (H) 10/02/2020   CREATININE 1.57 (H) 10/01/2020   CREATININE 1.45 (H) 08/08/2020   HEART failure: He has had CHF and was in the emergency room for treatment in July Currently on Lasix and Lanoxin along with Coreg but no other treatment      LIPIDS: He has had marked increase in triglycerides previously, now improved.  Previously on 20 mg lovastatin, now on 20 mg Lipitor  Did not have any coronary artery disease  He is also followed by PCP        Lab Results  Component Value Date   CHOL 116 07/31/2020   HDL 24 (L) 07/31/2020   LDLCALC 79 07/31/2020   LDLDIRECT 73.0 09/01/2017   TRIG 65 07/31/2020   CHOLHDL 4.8 07/31/2020       Thyroid:  Previously had mild hypothyroidism for a few years TSH subsequently has been normal consistently without taking any thyroid supplements  Is on lithium long-term  Lab Results  Component  Value Date   TSH 4.41 05/21/2020        He has had some numbness in his feet, more on the right side, using gabapentin only as needed May use elastic stockings also with some relief  Findings on last exam:   Patchy decrease in sensation in the toes especially right and decreased on the plantar surface also   LABS:  Office Visit on 11/01/2020  Component Date Value Ref Range Status   Hemoglobin A1C 11/01/2020 7.3 (A) 4.0 - 5.6 % Final   POC Glucose 11/01/2020 273 (A) 70 - 99 mg/dl Final    Physical Examination:  BP (!) 142/82   Pulse 86   Ht '6\' 1"'$  (1.854 m)   Wt 288 lb (130.6 kg)   SpO2 96%   BMI 38.00 kg/m      ASSESSMENT/PLAN:   Diabetes type 2, on insulin  See history of present illness for detailed discussion of his current management, blood sugar patterns and problems identified  A1c is higher this year and now 7.3 He has not been regular with his follow-up  He is on premixed insulin once a day only Metformin has been stopped but not clear if this was because of his renal dysfunction  He has not monitored his blood sugars around  Blood sugar is higher today because of not taking insulin However he is not planning his meals and eating more carbohydrate at times  No exercise  Recommendations: He needs to have better control Because of his history of heart  failure and renal disease he is a good candidate for an SGLT2 drug  Vania Rea is currently covered by his insurance Discussed action of SGLT 2 drugs on lowering glucose by decreasing kidney absorption of glucose, benefits of weight loss and lower blood pressure, possible side effects including mild dehydration He will temporarily reduce his Lasix to 20 mg while taking Jardiance  His insulin will be increased to 44 units in the morning Also he will start at least 1 g of metformin daily which he can take in the morning Discussed need to check blood sugars at least twice daily at dinner sometimes  RENAL  dysfunction: Etiology is unclear and will recheck microalbumin on the next visit However he does need to be evaluated by nephrology likely  Also reminded him to establish ongoing care with Dr. Ellyn Hack for his CHF  Follow-up in 4 weeks  Patient Instructions  Metfomin 1 in am daily with Jardiance  Take 44 units for am insulin  Check blood sugars on waking up 5 days a week  Also check blood sugars about 2 hours after meals and do this after different meals by rotation  Recommended blood sugar levels on waking up are 90-130 and about 2 hours after meal is 130-180  Please bring your blood sugar monitor to each visit, thank you    Elayne Snare 11/01/2020, 1:17 PM   Note: This office note was prepared with Dragon voice recognition system technology. Any transcriptional errors that result from this process are unintentional.

## 2020-11-01 NOTE — Addendum Note (Signed)
Addended by: Cinda Quest on: 11/01/2020 02:36 PM   Modules accepted: Orders

## 2020-11-05 ENCOUNTER — Other Ambulatory Visit: Payer: Self-pay | Admitting: Family Medicine

## 2020-11-05 DIAGNOSIS — I5023 Acute on chronic systolic (congestive) heart failure: Secondary | ICD-10-CM

## 2020-11-06 ENCOUNTER — Other Ambulatory Visit: Payer: Self-pay | Admitting: Family Medicine

## 2020-12-04 ENCOUNTER — Other Ambulatory Visit: Payer: Self-pay | Admitting: Family Medicine

## 2020-12-10 ENCOUNTER — Other Ambulatory Visit: Payer: Medicare HMO

## 2020-12-11 ENCOUNTER — Other Ambulatory Visit (INDEPENDENT_AMBULATORY_CARE_PROVIDER_SITE_OTHER): Payer: Medicare HMO

## 2020-12-11 ENCOUNTER — Other Ambulatory Visit: Payer: Self-pay

## 2020-12-11 DIAGNOSIS — E1165 Type 2 diabetes mellitus with hyperglycemia: Secondary | ICD-10-CM | POA: Diagnosis not present

## 2020-12-11 DIAGNOSIS — Z794 Long term (current) use of insulin: Secondary | ICD-10-CM | POA: Diagnosis not present

## 2020-12-11 DIAGNOSIS — Z8639 Personal history of other endocrine, nutritional and metabolic disease: Secondary | ICD-10-CM | POA: Diagnosis not present

## 2020-12-11 LAB — BASIC METABOLIC PANEL
BUN: 8 mg/dL (ref 6–23)
CO2: 27 mEq/L (ref 19–32)
Calcium: 8.7 mg/dL (ref 8.4–10.5)
Chloride: 105 mEq/L (ref 96–112)
Creatinine, Ser: 1.21 mg/dL (ref 0.40–1.50)
GFR: 63.16 mL/min (ref >60.00)
Glucose, Bld: 311 mg/dL — ABNORMAL HIGH (ref 70–99)
Potassium: 4 mEq/L (ref 3.5–5.1)
Sodium: 139 mEq/L (ref 135–145)

## 2020-12-11 LAB — TSH: TSH: 1.21 u[IU]/mL (ref 0.35–5.50)

## 2020-12-12 LAB — FRUCTOSAMINE: Fructosamine: 331 umol/L — ABNORMAL HIGH (ref 0–285)

## 2020-12-13 ENCOUNTER — Other Ambulatory Visit: Payer: Self-pay

## 2020-12-13 ENCOUNTER — Ambulatory Visit: Payer: Medicare HMO | Admitting: Endocrinology

## 2020-12-13 ENCOUNTER — Encounter: Payer: Self-pay | Admitting: Endocrinology

## 2020-12-13 ENCOUNTER — Ambulatory Visit (INDEPENDENT_AMBULATORY_CARE_PROVIDER_SITE_OTHER): Payer: Medicare HMO | Admitting: Endocrinology

## 2020-12-13 VITALS — BP 140/70 | HR 84 | Ht 73.0 in | Wt 294.6 lb

## 2020-12-13 DIAGNOSIS — E1165 Type 2 diabetes mellitus with hyperglycemia: Secondary | ICD-10-CM

## 2020-12-13 DIAGNOSIS — Z794 Long term (current) use of insulin: Secondary | ICD-10-CM

## 2020-12-13 DIAGNOSIS — I10 Essential (primary) hypertension: Secondary | ICD-10-CM

## 2020-12-13 MED ORDER — ONETOUCH VERIO VI STRP: ORAL_STRIP | 1 refills | Status: DC

## 2020-12-13 NOTE — Patient Instructions (Addendum)
Start OZEMPIC injections by dialing 0.25 mg on the pen as shown once weekly on the same day of the week.   You may inject in the sides of the stomach, outer thigh or arm as indicated in the brochure given. If you have any difficulties using the pen see the video at CompPlans.[email or web address].za  You will feel fullness of the stomach with starting the medication and should try to keep the portions at meals small.  You may experience nausea in the first few days which usually gets better over time    After 4 weeks increase the dose to 0.5 mg weekly  If you have any questions or persistent side effects please call the office   You may also talk to a nurse educator with [employer] at [phone] Useful website: [email or web address]    Take INSULIN 30 min beforE MEALS

## 2020-12-13 NOTE — Progress Notes (Signed)
Patient ID: BEACHER GIGLIA, male   DOB: 1956-03-24, 65 y.o.   MRN: [id number]    Reason for Appointment: Followup for Type 2 Diabetes    History of Present Illness:          Diagnosis: Type 2 diabetes mellitus, date of diagnosis:   2011       Past history:  He was started on metformin at diagnosis and apparently did have fairly good control initially However about a year later because of poor control he was given insulin in addition. He had been taking Lantus insulin until about 2/15, up to 80 units a day However he does not think his blood sugars  Were controlled with this Because of higher A1c of 13.1% he was referred here for diabetes management in 6/15; was started on glucose monitoring and insulin was changed from low dose Levemir to Humalog mix 70 units a day He was also started on Victoza  to help with blood sugar control and facilitate weight loss on his initial consultation  Recent history:   INSULIN regimen is described as: Novolin mix 70/30 with syringe, 50 units at breakfast, 40 units in p.m  Oral hypoglycemic drugs the patient is taking are: Jardiance 10 mg daily   A1c is last 7.3; has been as low as 5.8 and in 5/22 was 8.3 Fructosamine is high at 331   Current blood sugar patterns, management of diabetes and problems: His blood sugars are being checked with expired test strips from 2019  He is taking Jardiance since his last visit but did not appear to have any improvement in his blood sugars  Also is complaining of excessive frequent urination with Jardiance  His blood sugar in the lab was over 300 after breakfast and 241 at home in the morning before eating  He is sometimes taking his insulin after eating instead of before eating both in the morning and evening  Most of his blood sugars AFTER dinner are over 200  However fasting readings are generally only mildly increased  He may have high readings in the mornings when eating during the night  He says because of  depression he is tending to eat more portions, snacks and carbohydrates Weight is going up Still not exercising  No hypoglycemic symptoms at any time   Side effects from medications have been: None  Glucose monitoring:  done about 1 times a day or less       Glucometer:  Verio   Blood Glucose readings   PRE-MEAL Fasting Lunch Dinner Bedtime Overall  Glucose range: 108-242  112    Mean/median:     ?   POST-MEAL PC Breakfast PC Lunch PC Dinner  Glucose range:   220-310  Mean/median:      Glycemic control:   Lab Results  Component Value Date   HGBA1C 7.3 (A) 11/01/2020   HGBA1C 8.3 (H) 07/31/2020   HGBA1C 6.3 05/21/2020   Lab Results  Component Value Date   MICROALBUR 1.1 07/14/2019   LDLCALC 79 07/31/2020   CREATININE 1.21 12/11/2020   Lab Results  Component Value Date   FRUCTOSAMINE 331 (H) 12/11/2020   FRUCTOSAMINE 310 (H) 05/21/2020   FRUCTOSAMINE 275 12/13/2019    Self-care: The diet that the patient has been following is: None, unable to control portions and carbs  when depressed  Meals: 3 meals per day (dinner 6 pm-10 pm)        Dietician visit: Most recent: never      CDE  visit: 08/2013             Weight history: baseline 295  Wt Readings from Last 3 Encounters:  12/13/20 294 lb 9.6 oz (133.6 kg)  11/01/20 288 lb (130.6 kg)  10/05/20 289 lb 6.4 oz (131.3 kg)   Lab on 12/11/2020  Component Date Value Ref Range Status   TSH 12/11/2020 1.21  0.35 - 5.50 uIU/mL Final   Fructosamine 12/11/2020 331 (A) 0 - 285 umol/L Final   Comment: Published reference interval for apparently healthy subjects between age 24 and 28 is 1 - 285 umol/L and in a poorly controlled diabetic population is 228 - 563 umol/L with a mean of 396 umol/L.    Sodium 12/11/2020 139  135 - 145 mEq/L Final   Potassium 12/11/2020 4.0  3.5 - 5.1 mEq/L Final   Chloride 12/11/2020 105  96 - 112 mEq/L Final   CO2 12/11/2020 27  19 - 32 mEq/L Final   Glucose, Bld 12/11/2020 311 (A) 70 -  99 mg/dL Final   BUN 12/11/2020 8  6 - 23 mg/dL Final   Creatinine, Ser 12/11/2020 1.21  0.40 - 1.50 mg/dL Final   GFR 12/11/2020 63.16  >60.00 mL/min Final   Calculated using the CKD-EPI Creatinine Equation (2021)   Calcium 12/11/2020 8.7  8.4 - 10.5 mg/dL Final      Allergies as of 12/13/2020       Reactions   Influenza Vaccines Swelling, Other (See Comments)   Reports of swelling of the arm and then was "sick" for 10 days        Medication List        Accurate as of December 13, 2020 11:59 AM. If you have any questions, ask your nurse or doctor.          atorvastatin 20 MG tablet Commonly known as: LIPITOR Take 1 tablet (20 mg total) by mouth daily.   Blood Pressure Cuff Misc Use as directed What changed:  how much to take how to take this when to take this additional instructions   carvedilol 6.25 MG tablet Commonly known as: COREG TAKE 1 TABLET BY MOUTH 2 TIMES DAILY WITH A MEAL.   digoxin 0.125 MG tablet Commonly known as: LANOXIN Take 1 tablet (0.125 mg total) by mouth daily.   empagliflozin 10 MG Tabs tablet Commonly known as: Jardiance Take 1 tablet (10 mg total) by mouth daily with breakfast.   furosemide 40 MG tablet Commonly known as: LASIX TAKE 1 TABLET BY MOUTH EVERY DAY   INSULIN SYRINGE 1CC/31GX5/16" 31G X 5/16" 1 ML Misc Use 3 needles per day   lithium carbonate 300 MG capsule Take 3 capsules (900 mg total) by mouth daily. What changed: how much to take   losartan 25 MG tablet Commonly known as: COZAAR Take 1 tablet (25 mg total) by mouth daily.   NovoLIN 70/30 (70-30) 100 UNIT/ML injection Generic drug: insulin NPH-regular Human Inject 40 Units into the skin 2 (two) times daily with a meal.   OneTouch Verio test strip Generic drug: glucose blood USE AS INSTRUCTED TO CHECK BLOOD SUGAR 2 TIMES A DAY. What changed: See the new instructions. Changed by: Elayne Snare, MD   pantoprazole 20 MG tablet Commonly known as:  PROTONIX Take 1 tablet (20 mg total) by mouth 2 (two) times daily before a meal. TAKE 1 TABLET BY MOUTH TWICE A DAY BEFORE MEALS   QUEtiapine 200 MG tablet Commonly known as: SEROQUEL Take 400 mg by mouth at  bedtime.   sildenafil 50 MG tablet Commonly known as: Viagra Take 1 tablet (50 mg total) by mouth daily as needed for erectile dysfunction. Do not take more then once in 24 hours.   thiamine 100 MG tablet Take 1 tablet (100 mg total) by mouth daily.        Allergies:  Allergies  Allergen Reactions   Influenza Vaccines Swelling and Other (See Comments)    Reports of swelling of the arm and then was "sick" for 10 days    Past Medical History:  Diagnosis Date   Bipolar II disorder - Managed by Marye Round [phone]) 05/03/2013   Diabetes mellitus    Dysphagia    Hyperlipidemia    Hypertension    Unspecified hypothyroidism 05/03/2013    Past Surgical History:  Procedure Laterality Date   KNEE SURGERY     scope on left knee   RIGHT/LEFT HEART CATH AND CORONARY ANGIOGRAPHY N/A 08/07/2020   Procedure: RIGHT/LEFT HEART CATH AND CORONARY ANGIOGRAPHY;  Surgeon: Leonie Man, MD;  Location: [facility];  Service: Cardiovascular;  Laterality: N/A;    Family History  Problem Relation Age of Onset   Diabetes Mother    Hypertension Mother    Heart disease Mother    Heart disease Father    Heart attack Father 70   Diabetes Father    Hypertension Father    Depression Father    Diabetes Sister    Depression Paternal Grandmother    Depression Other     Social History:  reports that he has never smoked. He has never used smokeless tobacco. He reports that he does not drink alcohol and does not use drugs.    Review of Systems   HYPERTENSION:  He is taking losartan 25 mg and carvedilol 6.25 mg currently Blood pressure readings as follows:  BP Readings from Last 3 Encounters:  12/13/20 140/70  11/01/20 (!) 142/82  10/05/20 110/68   Renal  dysfunction: This appears improved now despite starting Jardiance  No previous history of microalbuminuria  Lab Results  Component Value Date   CREATININE 1.21 12/11/2020   CREATININE 1.53 (H) 10/02/2020   CREATININE 1.57 (H) 10/01/2020   HEART failure: He has had CHF and was in the emergency room for treatment in July Currently on Lasix and Lanoxin along with Coreg but no other treatment      LIPIDS: He has had marked increase in triglycerides previously, now improved.  Previously on 20 mg lovastatin, now on 20 mg Lipitor  Did not have any coronary artery disease  He is also followed by PCP        Lab Results  Component Value Date   CHOL 116 07/31/2020   HDL 24 (L) 07/31/2020   LDLCALC 79 07/31/2020   LDLDIRECT 73.0 09/01/2017   TRIG 65 07/31/2020   CHOLHDL 4.8 07/31/2020       Thyroid:  Previously had mild hypothyroidism for a few years TSH subsequently has been normal consistently without taking any thyroid supplements  Is on lithium long-term  Lab Results  Component Value Date   TSH 1.21 12/11/2020        He has had some numbness in his feet, more on the right side, using gabapentin only as needed May use elastic stockings also with some relief  Findings on last exam:   Patchy decrease in sensation in the toes especially right and decreased on the plantar surface also   LABS:  Lab on 12/11/2020  Component  Date Value Ref Range Status   TSH 12/11/2020 1.21  0.35 - 5.50 uIU/mL Final   Fructosamine 12/11/2020 331 (A) 0 - 285 umol/L Final   Comment: Published reference interval for apparently healthy subjects between age 50 and 59 is 39 - 285 umol/L and in a poorly controlled diabetic population is 228 - 563 umol/L with a mean of 396 umol/L.    Sodium 12/11/2020 139  135 - 145 mEq/L Final   Potassium 12/11/2020 4.0  3.5 - 5.1 mEq/L Final   Chloride 12/11/2020 105  96 - 112 mEq/L Final   CO2 12/11/2020 27  19 - 32 mEq/L Final   Glucose, Bld 12/11/2020 311  (A) 70 - 99 mg/dL Final   BUN 12/11/2020 8  6 - 23 mg/dL Final   Creatinine, Ser 12/11/2020 1.21  0.40 - 1.50 mg/dL Final   GFR 12/11/2020 63.16  >60.00 mL/min Final   Calculated using the CKD-EPI Creatinine Equation (2021)   Calcium 12/11/2020 8.7  8.4 - 10.5 mg/dL Final    Physical Examination:  BP 140/70   Pulse 84   Ht '6\' 1"'$  (1.854 m)   Wt 294 lb 9.6 oz (133.6 kg)   SpO2 96%   BMI 38.87 kg/m      ASSESSMENT/PLAN:   Diabetes type 2, on insulin  See history of present illness for detailed discussion of his current management, blood sugar patterns and problems identified  A1c is higher this year and last 7.3 Fructosamine is higher at 331  He is on premixed insulin twice daily along with Jardiance recently However does not appear to be benefiting from [place] with significantly high fructosamine and home blood sugars after meals or snacks  Even with taking 90 units of insulin a day his blood sugars are not controlled but mostly after meals  He has been using test strips expired 3 years ago to check his sugars and not clear if this is accurate  Also checking blood sugars randomly after dinner and most of these are high  He thinks he cannot follow his diet because of depression lately Also gaining weight  Recommendations: He can stop Jardiance as this is not benefiting him and he is complaining of polyuria Needs to take his INSULIN consistently 30-minute before eating both morning and evening He would benefit from a medication like Ozempic to improve his control and provide some weight loss especially with his difficulty in controlling his diet  Explained to the patient what GLP-1 drugs are, the sites of actions and the body, reduction of hunger sensation and improved insulin secretion.  Discussed the benefit of weight loss with these medications. Explained possible side effects of OZEMPIC, most commonly nausea that usually improves over time.  Also explained safety  information associated with the medication Demonstrated the medication injection device and injection technique to the patient.  To start the injections with the 0.25 mg dosage weekly for the first 4 injections and then go up to 0.5 mg weekly if no continued nausea  Patient brochure on Ozempic with enclosed voucher given Samples also given  His insulin will be continued at the same dose but he will take both shots before eating He will avoid high carbohydrate and high fat meals and snacks and keeps portions smaller  RENAL dysfunction: Appears improved  Hypertension: Fairly good control  Follow-up in 6 weeks  Total visit time including counseling = 30 minutes  Patient Instructions  Start OZEMPIC injections by dialing 0.25 mg on the pen as shown  once weekly on the same day of the week.   You may inject in the sides of the stomach, outer thigh or arm as indicated in the brochure given. If you have any difficulties using the pen see the video at CompPlans.co.za  You will feel fullness of the stomach with starting the medication and should try to keep the portions at meals small.  You may experience nausea in the first few days which usually gets better over time    After 4 weeks increase the dose to 0.5 mg weekly  If you have any questions or persistent side effects please call the office   You may also talk to a nurse educator with [employer] at [phone] Useful website: [email or web address]    Take INSULIN 30 min beforE MEALS    Elayne Snare 12/13/2020, 11:59 AM   Note: This office note was prepared with Dragon voice recognition system technology. Any transcriptional errors that result from this process are unintentional.

## 2020-12-19 ENCOUNTER — Telehealth: Payer: Self-pay | Admitting: Endocrinology

## 2020-12-19 DIAGNOSIS — E1165 Type 2 diabetes mellitus with hyperglycemia: Secondary | ICD-10-CM

## 2020-12-19 MED ORDER — ACCU-CHEK GUIDE W/DEVICE KIT: PACK | 0 refills | Status: DC

## 2020-12-19 MED ORDER — ACCU-CHEK GUIDE VI STRP: ORAL_STRIP | 3 refills | Status: DC

## 2020-12-19 MED ORDER — ACCU-CHEK SOFTCLIX LANCETS MISC: 3 refills | Status: DC

## 2020-12-19 NOTE — Telephone Encounter (Signed)
Pt states that [employer] wants pt to have accu check instead of one touch.pt would like to get the accu check instead of the one touch.. pt would like a call back once the accu check is called in    [facility], [place] - [address]

## 2020-12-19 NOTE — Telephone Encounter (Signed)
Rx sent to preferred pharmacy.

## 2020-12-19 NOTE — Addendum Note (Signed)
Addended by: Cinda Quest on: 12/19/2020 01:37 PM   Modules accepted: Orders

## 2020-12-20 DIAGNOSIS — Z79899 Other long term (current) drug therapy: Secondary | ICD-10-CM | POA: Diagnosis not present

## 2020-12-20 DIAGNOSIS — F3132 Bipolar disorder, current episode depressed, moderate: Secondary | ICD-10-CM | POA: Diagnosis not present

## 2021-01-02 ENCOUNTER — Telehealth: Payer: Self-pay | Admitting: Family Medicine

## 2021-01-02 NOTE — Chronic Care Management (AMB) (Signed)
  Chronic Care Management   Note  01/02/2021 Name: [name] MRN: [id number] DOB: [birth year]  Matthew Barnes is a 65 y.o. year old male who is a primary care patient of Koberlein, Steele Berg, MD. I reached out to Matthew Barnes by phone today in response to a referral sent by [name]'s PCP, Matthew Macadam, MD.   [name] was given information about Chronic Care Management services today including:  CCM service includes personalized support from designated clinical staff supervised by his physician, including individualized plan of care and coordination with other care providers 24/7 contact phone numbers for assistance for urgent and routine care needs. Service will only be billed when office clinical staff spend 20 minutes or more in a month to coordinate care. Only one practitioner may furnish and bill the service in a calendar month. The patient may stop CCM services at any time (effective at the end of the month) by phone call to the office staff.   Patient agreed to services and verbal consent obtained.   Follow up plan:   Tatjana Secretary/administrator

## 2021-01-04 ENCOUNTER — Ambulatory Visit (INDEPENDENT_AMBULATORY_CARE_PROVIDER_SITE_OTHER): Payer: Medicare HMO | Admitting: Family Medicine

## 2021-01-04 ENCOUNTER — Encounter: Payer: Self-pay | Admitting: Family Medicine

## 2021-01-04 ENCOUNTER — Other Ambulatory Visit: Payer: Self-pay

## 2021-01-04 VITALS — BP 142/78 | HR 98 | Temp 98.2°F | Ht 73.0 in | Wt 299.2 lb

## 2021-01-04 DIAGNOSIS — E039 Hypothyroidism, unspecified: Secondary | ICD-10-CM

## 2021-01-04 DIAGNOSIS — E1159 Type 2 diabetes mellitus with other circulatory complications: Secondary | ICD-10-CM

## 2021-01-04 DIAGNOSIS — F3160 Bipolar disorder, current episode mixed, unspecified: Secondary | ICD-10-CM

## 2021-01-04 DIAGNOSIS — E7800 Pure hypercholesterolemia, unspecified: Secondary | ICD-10-CM

## 2021-01-04 DIAGNOSIS — I152 Hypertension secondary to endocrine disorders: Secondary | ICD-10-CM | POA: Diagnosis not present

## 2021-01-04 DIAGNOSIS — E1140 Type 2 diabetes mellitus with diabetic neuropathy, unspecified: Secondary | ICD-10-CM | POA: Diagnosis not present

## 2021-01-04 DIAGNOSIS — Z794 Long term (current) use of insulin: Secondary | ICD-10-CM

## 2021-01-04 DIAGNOSIS — I420 Dilated cardiomyopathy: Secondary | ICD-10-CM | POA: Diagnosis not present

## 2021-01-04 MED ORDER — LOSARTAN POTASSIUM 50 MG PO TABS: 25.0000 mg | ORAL_TABLET | Freq: Every day | ORAL | 1 refills | Status: DC

## 2021-01-04 MED ORDER — FUROSEMIDE 20 MG PO TABS: 20.0000 mg | ORAL_TABLET | Freq: Every day | ORAL | 1 refills | Status: DC

## 2021-01-04 NOTE — Progress Notes (Signed)
Matthew Barnes DOB: 05-May-1955 Encounter date: 01/04/2021  This is a 65 y.o. male who presents with Chief Complaint  Patient presents with   Follow-up     History of present illness: Last visit with me was 10/05/20 for hospital follow up.  Not much is changed with him since then.  He has a difficult time with remembering things and relies heavily on his phone to help him with scheduling he still feels that his speech is not quite 100%.  didn't complete cologuard: he did get this and states he will complete.   Not getting swelling; just eating more. Eats more with depression. Eating more carbs, sweets.  He is not taking the Lasix currently.  Psychiatry wants to start ritalin and suggested him seeing cardiology to get approval for this.   HTN: He does not routinely check blood pressures at home.  He does seem to be taking all of his medications including carvedilol 6.25 mg twice daily, digoxin 0.25 mg daily losartan 25 mg daily.   DMII: follows with Dr Dwyane Dee. 70/30 insulin BID 40 units. States that his sugar has been doing ok in general. Know it is worse if he eats at night - then jumps up to 180's.   Bipolar 2: [facility], seroquel 461m bedtime, lithium 9047mdaily  [id number] Allergies  Allergen Reactions   Influenza Vaccines Swelling and Other (See Comments)    Reports of swelling of the arm and then was "sick" for 10 days   Current Meds  Medication Sig   Accu-Chek Softclix Lancets lancets Check blood sugar 2 times daily   atorvastatin (LIPITOR) 20 MG tablet Take 1 tablet (20 mg total) by mouth daily.   Blood Glucose Monitoring Suppl (ACCU-CHEK GUIDE) w/Device KIT Check blood sugar 2 times daily   Blood Pressure Monitoring (BLOOD PRESSURE CUFF) MISC Use as directed (Patient taking differently: 1 each by Other route as directed.)   carvedilol (COREG) 6.25 MG tablet TAKE 1 TABLET BY MOUTH 2 TIMES DAILY WITH A MEAL.   digoxin (LANOXIN) 0.125 MG  tablet Take 1 tablet (0.125 mg total) by mouth daily.   furosemide (LASIX) 20 MG tablet Take 1 tablet (20 mg total) by mouth daily.   glucose blood (ACCU-CHEK GUIDE) test strip Use to check blood sugar 2 times daily   glucose blood (ONETOUCH VERIO) test strip USE AS INSTRUCTED TO CHECK BLOOD SUGAR 2 TIMES A DAY.   insulin NPH-regular Human (NOVOLIN 70/30) (70-30) 100 UNIT/ML injection Inject 40 Units into the skin 2 (two) times daily with a meal.   Insulin Syringe-Needle U-100 (INSULIN SYRINGE 1CC/31GX5/16") 31G X 5/16" 1 ML MISC Use 3 needles per day   lithium carbonate 300 MG capsule Take 3 capsules (900 mg total) by mouth daily. (Patient taking differently: Take 1,200 mg by mouth daily.)   pantoprazole (PROTONIX) 20 MG tablet Take 1 tablet (20 mg total) by mouth 2 (two) times daily before a meal. TAKE 1 TABLET BY MOUTH TWICE A DAY BEFORE MEALS   QUEtiapine (SEROQUEL) 200 MG tablet Take 400 mg by mouth at bedtime.   Semaglutide (OZEMPIC, 1 MG/DOSE, [place]) Inject into the skin.   sildenafil (VIAGRA) 50 MG tablet Take 1 tablet (50 mg total) by mouth daily as needed for erectile dysfunction. Do not take more then once in 24 hours.   [DISCONTINUED] furosemide (LASIX) 40 MG tablet TAKE 1 TABLET BY MOUTH EVERY DAY   [DISCONTINUED] losartan (COZAAR) 25 MG tablet Take 1  tablet (25 mg total) by mouth daily.   [DISCONTINUED] thiamine 100 MG tablet Take 1 tablet (100 mg total) by mouth daily.    Review of Systems  Constitutional:  Negative for chills, fatigue and fever.  Respiratory:  Negative for cough, chest tightness, shortness of breath and wheezing.   Cardiovascular:  Negative for chest pain, palpitations and leg swelling.  Psychiatric/Behavioral:         Mood is depressed. He has always struggled with depression.   Objective:  BP (!) 142/78 (BP Location: Left Arm, Patient Position: Sitting, Cuff Size: Large)   Pulse 98   Temp 98.2 F (36.8 C) (Oral)   Ht '6\' 1"'  (1.854 m)   Wt 299 lb 3.2 oz  (135.7 kg)   SpO2 95%   BMI 39.47 kg/m   Weight: 299 lb 3.2 oz (135.7 kg)   BP Readings from Last 3 Encounters:  01/04/21 (!) 142/78  12/13/20 140/70  11/01/20 (!) 142/82   Wt Readings from Last 3 Encounters:  01/04/21 299 lb 3.2 oz (135.7 kg)  12/13/20 294 lb 9.6 oz (133.6 kg)  11/01/20 288 lb (130.6 kg)    Physical Exam Constitutional:      General: He is not in acute distress.    Appearance: He is well-developed.  HENT:     Head: Normocephalic and atraumatic.  Cardiovascular:     Rate and Rhythm: Normal rate and regular rhythm.     Heart sounds: Normal heart sounds. No murmur heard. Pulmonary:     Effort: Pulmonary effort is normal.     Breath sounds: Normal breath sounds.  Abdominal:     General: Bowel sounds are normal. There is no distension.     Palpations: Abdomen is soft.     Tenderness: There is no abdominal tenderness. There is no guarding.  Musculoskeletal:     Right lower leg: 2+ Pitting Edema present.     Left lower leg: 2+ Pitting Edema present.  Feet:     Comments: Normal bilateral monofilament foot exam.  Mild heel callus.  Skin is intact. Skin:    General: Skin is warm and dry.     Comments: Sensory exam of the foot is normal, tested with the monofilament. Good pulses, no lesions or ulcers, good peripheral pulses.  Psychiatric:        Judgment: Judgment normal.    Assessment/Plan  1. Hypertension associated with diabetes ([place]) Blood verified increase losartan to 50 mg and continue other medications.  I also advised him to restart his Lasix to help keep fluid off.  He saw cardiology while in the hospital, but has not been seeing them regularly.  I will place referral for management of cardiac care. - Ambulatory referral to Cardiology - losartan (COZAAR) 50 MG tablet; Take 1 tablet (50 mg total) by mouth daily.  Dispense: 90 tablet; Refill: 1  2. Dilated cardiomyopathy ([place]) See above.  Discussed importance of keeping up with regular Lasix use.   Discussed fluid overload issues that happened when he was in the hospital last time.  He does not recall details from that hospitalization, but had forgotten about issues with fluid overload. - Ambulatory referral to Cardiology  3. Type 2 diabetes mellitus with diabetic neuropathy, with long-term current use of insulin [facility]) Following regularly with endocrinology. He may benefit from chronic condition follow up for med compliance. Will discuss with him at next visit and after review of upcoming specialty visits.   4. Hypothyroidism, unspecified type Recheck thyroid function.  5. Pure hypercholesterolemia Continue with lipitor 11m daily. Has been well controlled.   6. 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 Follows regularly with psychiatry/therapy. 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    Return in about 3 months (around 04/06/2021) for Chronic condition visit.      JMicheline Rough MD

## 2021-01-04 NOTE — Patient Instructions (Addendum)
*  I would like for you to restart your lasix. I have sent in new dose (20mg ). It would be ok for now for you to take every other day and monitor leg swelling. If legs are staying swollen where you can make an imprint in the morning; then I would increase to every day.   *cardiology will call you to set up follow up visit.   *I am increasing the losartan to 50mg  daily for better pressure control. If you are able to check blood pressures at home that would be helpful.

## 2021-01-10 MED ORDER — LOSARTAN POTASSIUM 50 MG PO TABS: 50.0000 mg | ORAL_TABLET | Freq: Every day | ORAL | 1 refills | Status: DC

## 2021-01-16 ENCOUNTER — Other Ambulatory Visit: Payer: Self-pay | Admitting: Family Medicine

## 2021-01-16 DIAGNOSIS — I5023 Acute on chronic systolic (congestive) heart failure: Secondary | ICD-10-CM

## 2021-01-16 DIAGNOSIS — E7800 Pure hypercholesterolemia, unspecified: Secondary | ICD-10-CM

## 2021-01-25 ENCOUNTER — Ambulatory Visit: Payer: Medicare HMO | Admitting: Endocrinology

## 2021-01-30 ENCOUNTER — Other Ambulatory Visit: Payer: Self-pay

## 2021-01-30 ENCOUNTER — Telehealth: Payer: Self-pay | Admitting: Pharmacist

## 2021-01-30 ENCOUNTER — Ambulatory Visit (INDEPENDENT_AMBULATORY_CARE_PROVIDER_SITE_OTHER): Payer: Medicare HMO | Admitting: Family Medicine

## 2021-01-30 VITALS — BP 150/90 | HR 117 | Temp 98.5°F | Wt 305.3 lb

## 2021-01-30 DIAGNOSIS — R062 Wheezing: Secondary | ICD-10-CM

## 2021-01-30 MED ORDER — PREDNISONE 20 MG PO TABS: ORAL_TABLET | ORAL | 0 refills | Status: DC

## 2021-01-30 MED ORDER — ALBUTEROL SULFATE HFA 108 (90 BASE) MCG/ACT IN AERS: 2.0000 | INHALATION_SPRAY | Freq: Four times a day (QID) | RESPIRATORY_TRACT | 0 refills | Status: DC | PRN

## 2021-01-30 NOTE — Progress Notes (Signed)
Established Patient Office Visit  Subjective:  Patient ID: [name], male    DOB: Oct 23, 1955  Age: 65 y.o. MRN: [id number]  CC:  Chief Complaint  Patient presents with   Cough    X 3 weeks. Slightly productive but mostly a dry cough, no other symptoms.     HPI Matthew Barnes presents for cough for about 3 weeks.  This is been slightly productive but mostly dry.  He had typical URI type symptoms at the beginning of this.  Denies any dyspnea.  He has not had any fever.  No hemoptysis.  He had chest x-ray back in July which showed no acute findings.  He is a non-smoker.  He does have type 2 diabetes and does have history of systolic heart failure.  No recent peripheral edema.  No orthopnea.  Last A1c 7.3%.  Not aware of any history of asthma but has been aware of some recent wheezing since his respiratory illness.  Past Medical History:  Diagnosis Date   Bipolar II disorder - Managed by Marye Round [phone]) 05/03/2013   Diabetes mellitus    Dysphagia    Hyperlipidemia    Hypertension    Unspecified hypothyroidism 05/03/2013    Past Surgical History:  Procedure Laterality Date   KNEE SURGERY     scope on left knee   RIGHT/LEFT HEART CATH AND CORONARY ANGIOGRAPHY N/A 08/07/2020   Procedure: RIGHT/LEFT HEART CATH AND CORONARY ANGIOGRAPHY;  Surgeon: Leonie Man, MD;  Location: [facility];  Service: Cardiovascular;  Laterality: N/A;    Family History  Problem Relation Age of Onset   Diabetes Mother    Hypertension Mother    Heart disease Mother    Heart disease Father    Heart attack Father 56   Diabetes Father    Hypertension Father    Depression Father    Diabetes Sister    Depression Paternal Grandmother    Depression Other     Social History   Socioeconomic History   Marital status: Single    Spouse name: Not on file   Number of children: Not on file   Years of education: Not on file   Highest education level: Not on file  Occupational  History   Not on file  Tobacco Use   Smoking status: Never   Smokeless tobacco: Never  Substance and Sexual Activity   Alcohol use: No   Drug use: No   Sexual activity: Never  Other Topics Concern   Not on file  Social History Narrative   Work or School: on disability for knee arthritis      Home Situation: lives alone      Spiritual Beliefs:Christian      Lifestyle:no regular exercising; diet not great            Social Determinants of Health   Financial Resource Strain: Low Risk    Difficulty of Paying Living Expenses: Not hard at all  Food Insecurity: No Food Insecurity   Worried About Charity fundraiser in the Last Year: Never true   Arboriculturist in the Last Year: Never true  Transportation Needs: No Transportation Needs   Lack of Transportation (Medical): No   Lack of Transportation (Non-Medical): No  Physical Activity: Insufficiently Active   Days of Exercise per Week: 5 days   Minutes of Exercise per Session: 20 min  Stress: No Stress Concern Present   Feeling of Stress : Not at all  Social Connections: Socially Isolated   Frequency of Communication with Friends and Family: Three times a week   Frequency of Social Gatherings with Friends and Family: Never   Attends Religious Services: Never   Marine scientist or Organizations: No   Attends Music therapist: Never   Marital Status: Never married  Human resources officer Violence: Not At Risk   Fear of Current or Ex-Partner: No   Emotionally Abused: No   Physically Abused: No   Sexually Abused: No    Outpatient Medications Prior to Visit  Medication Sig Dispense Refill   Accu-Chek Softclix Lancets lancets Check blood sugar 2 times daily 100 each 3   atorvastatin (LIPITOR) 20 MG tablet TAKE 1 TABLET BY MOUTH EVERY DAY 90 tablet 1   Blood Glucose Monitoring Suppl (ACCU-CHEK GUIDE) w/Device KIT Check blood sugar 2 times daily 1 kit 0   Blood Pressure Monitoring (BLOOD PRESSURE CUFF) MISC Use  as directed (Patient taking differently: 1 each by Other route as directed.) 1 each 0   carvedilol (COREG) 6.25 MG tablet TAKE 1 TABLET BY MOUTH 2 TIMES DAILY WITH A MEAL. 180 tablet 1   digoxin (LANOXIN) 0.125 MG tablet TAKE 1 TABLET BY MOUTH DAILY 90 tablet 1   furosemide (LASIX) 20 MG tablet Take 1 tablet (20 mg total) by mouth daily. 90 tablet 1   glucose blood (ACCU-CHEK GUIDE) test strip Use to check blood sugar 2 times daily 100 each 3   glucose blood (ONETOUCH VERIO) test strip USE AS INSTRUCTED TO CHECK BLOOD SUGAR 2 TIMES A DAY. 200 each 1   insulin NPH-regular Human (NOVOLIN 70/30) (70-30) 100 UNIT/ML injection Inject 40 Units into the skin 2 (two) times daily with a meal. 20 mL 3   Insulin Syringe-Needle U-100 (INSULIN SYRINGE 1CC/31GX5/16") 31G X 5/16" 1 ML MISC Use 3 needles per day 100 each 3   lithium carbonate 300 MG capsule Take 3 capsules (900 mg total) by mouth daily. (Patient taking differently: Take 1,200 mg by mouth daily.)     losartan (COZAAR) 50 MG tablet Take 1 tablet (50 mg total) by mouth daily. 90 tablet 1   pantoprazole (PROTONIX) 20 MG tablet Take 1 tablet (20 mg total) by mouth 2 (two) times daily before a meal. TAKE 1 TABLET BY MOUTH TWICE A DAY BEFORE MEALS 180 tablet 1   QUEtiapine (SEROQUEL) 200 MG tablet Take 400 mg by mouth at bedtime.     Semaglutide (OZEMPIC, 1 MG/DOSE, [place]) Inject into the skin.     sildenafil (VIAGRA) 50 MG tablet Take 1 tablet (50 mg total) by mouth daily as needed for erectile dysfunction. Do not take more then once in 24 hours. 10 tablet 3   No facility-administered medications prior to visit.    Allergies  Allergen Reactions   Influenza Vaccines Swelling and Other (See Comments)    Reports of swelling of the arm and then was "sick" for 10 days    ROS Review of Systems  Constitutional:  Negative for chills and fever.  Respiratory:  Positive for cough and wheezing.   Cardiovascular:  Negative for chest pain and leg swelling.      Objective:    Physical Exam Vitals reviewed.  Cardiovascular:     Rate and Rhythm: Normal rate.  Pulmonary:     Comments: He does have some wheezing bilaterally but no retractions.  Normal respiratory rate.  No respiratory distress.  Pulse oximetry 98%. Neurological:     Mental Status: He  is alert.    BP (!) 150/90 (BP Location: Left Arm, Patient Position: Sitting, Cuff Size: Large)   Pulse (!) 117   Temp 98.5 F (36.9 C) (Oral)   Wt (!) 305 lb 4.8 oz (138.5 kg)   SpO2 98%   BMI 40.28 kg/m  Wt Readings from Last 3 Encounters:  01/30/21 (!) 305 lb 4.8 oz (138.5 kg)  01/04/21 299 lb 3.2 oz (135.7 kg)  12/13/20 294 lb 9.6 oz (133.6 kg)     Health Maintenance Due  Topic Date Due   Zoster Vaccines- Shingrix (1 of 2) Never done   Pneumococcal Vaccine 33-30 Years old (2 - PCV) 01/18/2015   Fecal DNA (Cologuard)  11/07/2019   COVID-19 Vaccine (4 - Booster for Moderna series) 04/25/2020   FOOT EXAM  07/13/2020   OPHTHALMOLOGY EXAM  12/29/2020    There are no preventive care reminders to display for this patient.  Lab Results  Component Value Date   TSH 1.21 12/11/2020   Lab Results  Component Value Date   WBC 5.9 10/02/2020   HGB 11.8 (L) 10/02/2020   HCT 39.7 10/02/2020   MCV 84.5 10/02/2020   PLT 135 (L) 10/02/2020   Lab Results  Component Value Date   NA 139 12/11/2020   K 4.0 12/11/2020   CO2 27 12/11/2020   GLUCOSE 311 (H) 12/11/2020   BUN 8 12/11/2020   CREATININE 1.21 12/11/2020   BILITOT 0.7 10/02/2020   ALKPHOS 39 10/02/2020   AST 28 10/02/2020   ALT 17 10/02/2020   PROT 6.9 10/02/2020   ALBUMIN 4.1 10/02/2020   CALCIUM 8.7 12/11/2020   ANIONGAP 7 10/02/2020   GFR 63.16 12/11/2020   Lab Results  Component Value Date   CHOL 116 07/31/2020   Lab Results  Component Value Date   HDL 24 (L) 07/31/2020   Lab Results  Component Value Date   LDLCALC 79 07/31/2020   Lab Results  Component Value Date   TRIG 65 07/31/2020   Lab Results   Component Value Date   CHOLHDL 4.8 07/31/2020   Lab Results  Component Value Date   HGBA1C 7.3 (A) 11/01/2020      Assessment & Plan:   Problem List Items Addressed This Visit   None Visit Diagnoses     Wheezing    -  Primary     Patient had recent URI and suspect this is post viral reactive airway.  No respiratory distress.  We did discuss prednisone 20 mg twice daily for 5 days.  He is aware this will likely elevate his blood sugars temporarily.  Keep glycemic intake low and monitor blood sugars regularly.  Also send in albuterol MDI 2 puffs every 4-6 hours as needed for cough and wheeze.  Follow-up for any persistent or worsening symptoms.  Meds ordered this encounter  Medications   predniSONE (DELTASONE) 20 MG tablet    Sig: Take two tablets by mouth once daily for 5 days.    Dispense:  10 tablet    Refill:  0   albuterol (VENTOLIN HFA) 108 (90 Base) MCG/ACT inhaler    Sig: Inhale 2 puffs into the lungs every 6 (six) hours as needed for wheezing or shortness of breath.    Dispense:  8 g    Refill:  0    Follow-up: No follow-ups on file.    Carolann Littler, MD

## 2021-01-30 NOTE — Patient Instructions (Signed)
Monitor blood sugars and they will likely bump up for a few days on the prednisone  Be in touch if cough and wheezing not improved over next week.

## 2021-01-30 NOTE — Chronic Care Management (AMB) (Signed)
Chronic Care Management Pharmacy Assistant   Name: HARTLEY URTON  MRN: [id number] DOB: 04/27/1955  [name] is an 65 y.o. year old male who presents for his initial CCM visit with the clinical pharmacist.  Reason for Encounter: Chart prep for initial visit with Jeni Salles the Clinical Pharmacist on 02/05/21 at 11:15 via phone.   Conditions to be addressed/monitored: HTN, HLD, DMII, and Hypothyroidism  Recent office visits:  01/04/2021 Micheline Rough MD (PCP) - Patient was seen for Hypertension associated with diabetes and additional issues. Increased Losartan from 25 mg daily to 50 mg daily. Discontinued Empagliflozin and Thiamine. Follow up in  3 months.  10/05/2020 Micheline Rough MD (PCP) - Patient was seen for Hypertension associated with diabetes and additional issues. Follow up in 3 months.   Recent consult visits:  12/13/2020 Elayne Snare MD (endocrinology) - Patient was seen for Uncontrolled type 2 diabetes mellitus and an additional issue. Started Ozempic 0.77m weekly to increase at 4 wks to 0.568mweekly. Follow up in 6 weeks.  11/01/2020 AjElayne SnareD (endocrinology) - Patient was seen for Uncontrolled type 2 diabetes mellitus and additional issues. Started Empagliflozin 10 mg daily. Follow up in 4 weeks.  Hospital visits:  Medication Reconciliation was completed by comparing discharge summary, patient's EMR and Pharmacy list, and upon discussion with patient.  [id number] to Systolic congestive heart failure. Discharge date was 10/03/2020. Time spent in ED was 5 hours.    [facility] Medication Changes at Hospital Discharge: None Medications Discontinued at Hospital Discharge: None  -All other medications will remain the same.    [facility] to Acute congestive heart failure and additional  issues. Discharge date was 10/02/2020. Time spent in ED was 2 hours.  [facility] Medication Changes at Hospital Discharge: None Medications Discontinued at Hospital Discharge: None  -All other medications will remain the same.    Medications: Outpatient Encounter Medications as of 01/30/2021  Medication Sig   Accu-Chek Softclix Lancets lancets Check blood sugar 2 times daily   atorvastatin (LIPITOR) 20 MG tablet TAKE 1 TABLET BY MOUTH EVERY DAY   Blood Glucose Monitoring Suppl (ACCU-CHEK GUIDE) w/Device KIT Check blood sugar 2 times daily   Blood Pressure Monitoring (BLOOD PRESSURE CUFF) MISC Use as directed (Patient taking differently: 1 each by Other route as directed.)   carvedilol (COREG) 6.25 MG tablet TAKE 1 TABLET BY MOUTH 2 TIMES DAILY WITH A MEAL.   digoxin (LANOXIN) 0.125 MG tablet TAKE 1 TABLET BY MOUTH DAILY   furosemide (LASIX) 20 MG tablet Take 1 tablet (20 mg total) by mouth daily.   glucose blood (ACCU-CHEK GUIDE) test strip Use to check blood sugar 2 times daily   glucose blood (ONETOUCH VERIO) test strip USE AS INSTRUCTED TO CHECK BLOOD SUGAR 2 TIMES A DAY.   insulin NPH-regular Human (NOVOLIN 70/30) (70-30) 100 UNIT/ML injection Inject 40 Units into the skin 2 (two) times daily with a meal.   Insulin Syringe-Needle U-100 (INSULIN SYRINGE 1CC/31GX5/16") 31G X 5/16" 1 ML MISC Use 3 needles per day   lithium carbonate 300 MG capsule Take 3 capsules (900 mg total) by mouth daily. (Patient taking differently: Take 1,200 mg by mouth daily.)   losartan (COZAAR) 50 MG tablet Take 1 tablet (50 mg total) by mouth daily.   pantoprazole (PROTONIX) 20 MG tablet  Take 1 tablet (20 mg total) by mouth 2 (two) times daily before a meal. TAKE 1 TABLET BY MOUTH TWICE A DAY BEFORE MEALS   QUEtiapine (SEROQUEL) 200 MG tablet Take 400 mg by mouth at bedtime.   Semaglutide (OZEMPIC, 1 MG/DOSE, [place]) Inject into the skin.   sildenafil (VIAGRA) 50 MG  tablet Take 1 tablet (50 mg total) by mouth daily as needed for erectile dysfunction. Do not take more then once in 24 hours.   No facility-administered encounter medications on file as of 01/30/2021.   Fill History: atorvastatin (LIPITOR) tablet 10/05/2020 90   carvedilol (COREG) tablet 10/05/2020 90   digoxin (LANOXIN) tablet 10/05/2020 90   JARDIANCE 10 MG PO TABS 12/06/2020 30   furosemide (LASIX) tablet 11/07/2020 90   LITHIUM CARBONATE 300 MG CAP 09/12/2020 90   losartan (COZAAR) tablet 10/05/2020 90   pantoprazole (PROTONIX) EC tablet 12/05/2020 90   QUEtiapine (SEROQUEL) tablet 11/26/2020 90   Have you seen any other providers since your last visit? **no  Any changes in your medications or health? no  Any side effects from any medications? no  Do you have an symptoms or problems not managed by your medications? no  Any concerns about your health right now?  He just wants to feel better.  Has your provider asked that you check blood pressure, blood sugar, or follow special diet at home? Yes, Patient is checking blood sugars 2-3 times daily, not checking blood pressure. Patient is eating a sausage, egg biscuit from [employer] every morning for breakfast, he will eat a meat of some kind sandwich for lunch and dinner or a salad at dinner.   Do you get any type of exercise on a regular basis? No, patient is getting very little activity he states due to his knee's.  Can you think of a goal you would like to reach for your health? He just wants to feel better.  Do you have any problems getting your medications? Yes,  Ozempic recently given to him by Dr. Dwyane Dee, patient received on sample and Dr. Dwyane Dee never sent in a prescription for this.  Patient is no longer taking Protonix he states it didn't work and has restarted his old prescription for Omeprazole 110m.  Patient states Novalog 70/30 is being taken 50 units AM and 40 units PM.  Is there anything that you would like  to discuss during the appointment? No  Please bring medications and supplements to appointment  Care Gaps: AWV scheduled for 02/27/2021 Last BP - 142/78 on 01/04/2021 Last A1C - 7.3 on 11/01/2020 Shingrix - never done Pneumovax - overdue Cologuard - overdue Covid vaccine - overdue Foot exam - overdue Eye exam - overdue  Star Rating Drugs: Atorvastatin 20 mg - last filled [facility]/03/21 [facility]  Jardiance 10 mg - last filled 12/06/2020 30 [facility] Losartan 50 mg - last filled 01/21/21 90 [facility] Novolin 70/30 100 units per/ml - last filled 01/27/21 to be picked up  Ozempic 1 mg dose - [facility] recently given by Dr KDwyane DeeVerified with LModena Nunneryat CMoss BluffPharmacist Assistant [phone]

## 2021-02-04 ENCOUNTER — Telehealth: Payer: Self-pay | Admitting: Pharmacist

## 2021-02-04 ENCOUNTER — Telehealth: Payer: Self-pay | Admitting: Family Medicine

## 2021-02-04 NOTE — Telephone Encounter (Signed)
Patient saw Dr. Elease Hashimoto on 11/2 for a cough that he has had for a little while. He says that he was given predniSONE (DELTASONE) 20 MG tablet and albuterol (VENTOLIN HFA) 108 (90 Base) MCG/ACT inhaler after seeing Dr. Elease Hashimoto but he says that it does not seem as if it is helping, because he is still coughing.   Patient would like to know if there is something else that could be taken to get rid of his cough.  Patient would like a call at [phone].  Please advise.

## 2021-02-04 NOTE — Chronic Care Management (AMB) (Signed)
    Chronic Care Management Pharmacy Assistant   Name: Matthew Barnes  MRN: [id number] DOB: 1955-12-09  02/05/2021 APPOINTMENT REMINDER   Called Matthew Barnes, Spoke with patient, notified of appointment on 02/05/2021 at 11:15 via telephone visit with Jeni Salles, Pharm D. Notified to have all medications, supplements, blood pressure and/or blood sugar logs available during appointment and to return call if need to reschedule. Patient was unable to answer questions at this time, states he was traveling.   Care Gaps: AWV scheduled for 02/27/2021 Last BP - 142/78 on 01/04/2021 Last A1C - 7.3 on 11/01/2020 Shingrix - never done Pneumovax - overdue Cologuard - overdue Covid vaccine - overdue Foot exam - overdue Eye exam - overdue Star Rating Drug: Atorvastatin 20 mg - last filled [facility]/03/21 [facility]  Jardiance 10 mg - last filled 12/06/2020 30 [facility] Losartan 50 mg - last filled 01/21/21 90 [facility] Novolin 70/30 100 units per/ml - last filled 01/27/21 to be picked up  Ozempic 1 mg dose - [facility] recently given by Dr Dwyane Dee Ver[facility] with [facility] at [facility]  Any gaps in medications fill history? No    [place]  Catering manager [phone]

## 2021-02-05 ENCOUNTER — Ambulatory Visit (INDEPENDENT_AMBULATORY_CARE_PROVIDER_SITE_OTHER): Payer: Medicare HMO | Admitting: Pharmacist

## 2021-02-05 DIAGNOSIS — E1140 Type 2 diabetes mellitus with diabetic neuropathy, unspecified: Secondary | ICD-10-CM

## 2021-02-05 DIAGNOSIS — E1159 Type 2 diabetes mellitus with other circulatory complications: Secondary | ICD-10-CM

## 2021-02-05 DIAGNOSIS — I152 Hypertension secondary to endocrine disorders: Secondary | ICD-10-CM

## 2021-02-05 DIAGNOSIS — Z794 Long term (current) use of insulin: Secondary | ICD-10-CM

## 2021-02-05 MED ORDER — BENZONATATE 100 MG PO CAPS: ORAL_CAPSULE | ORAL | 0 refills | Status: DC

## 2021-02-05 NOTE — Telephone Encounter (Signed)
Rx sent in. Spoke with the patient. He is aware the Rx has been sent in and to follow up with Dr. Inocente Salles if symptoms persist.

## 2021-02-05 NOTE — Patient Instructions (Signed)
Hi [name],  It was great to get to meet you over the telephone! Below is a summary of some of the topics we discussed.   Please start checking your blood pressure at least weekly at home and weighing yourself daily to make sure you are keeping off the water weight. Also, please make sure to start taking the furosemide at least every other day to prevent any water weight gain.  Please reach out to me if you have any questions or need anything before our follow up!  Best, Maddie  Jeni Salles, PharmD, [place] Pharmacist [facility]   Visit Information   Goals Addressed             This Visit's Progress    Manage My Medicine       Timeframe:  Long-Range Goal Priority:  Medium Start Date:                             Expected End Date:                       Follow Up Date 04/07/21    - keep a list of all the medicines I take; vitamins and herbals too - use a pillbox to sort medicine - use an alarm clock or phone to remind me to take my medicine    Why is this important?   These steps will help you keep on track with your medicines.   Notes:      Track and Manage My Blood Pressure-Hypertension       Timeframe:  Long-Range Goal Priority:  Medium Start Date:                             Expected End Date:                       Follow Up Date 04/07/21    - check blood pressure weekly - choose a place to take my blood pressure (home, clinic or office, retail store) - write blood pressure results in a log or diary    Why is this important?   You won't feel high blood pressure, but it can still hurt your blood vessels.  High blood pressure can cause heart or kidney problems. It can also cause a stroke.  Making lifestyle changes like losing a little weight or eating less salt will help.  Checking your blood pressure at home and at different times of the day can help to control blood pressure.  If the doctor prescribes medicine  remember to take it the way the doctor ordered.  Call the office if you cannot afford the medicine or if there are questions about it.     Notes:        Patient Care Plan: CCM Pharmacy Care Plan     Problem Identified: Problem: Hypertension, Hyperlipidemia, Diabetes, Heart Failure, GERD, Hypothyroidism, and Bipolar disorder      Long-Range Goal: Patient-Specific Goal   Start Date: 02/05/2021  Expected End Date: 02/05/2022  This Visit's Progress: On track  Priority: High  Note:   Current Barriers:  Unable to independently afford treatment regimen Unable to independently monitor therapeutic efficacy Unable to achieve control of diabetes  Unable to maintain control of blood pressure Unable to self administer medications as prescribed  Pharmacist Clinical Goal(s):  Patient  will verbalize ability to afford treatment regimen achieve adherence to monitoring guidelines and medication adherence to achieve therapeutic efficacy achieve control of diabetes as evidenced by A1c maintain control of blood pressure as evidenced by home and office blood pressure readings  through collaboration with PharmD and provider.   Interventions: 1:1 collaboration with Caren Macadam, MD regarding development and update of comprehensive plan of care as evidenced by provider attestation and co-signature Inter-disciplinary care team collaboration (see longitudinal plan of care) Comprehensive medication review performed; medication list updated in electronic medical record  Hypertension (BP goal <130/80) -Uncontrolled -Current treatment: Losartan 50 mg 1 tablet daily - taking 100 mg daily -Medications previously tried: amlodipine  -Current home readings: does not check but has an arm cuff -Current dietary habits: does not pay attention to sodium intake -Current exercise habits: unable to with shortness of breath -Denies hypotensive/hypertensive symptoms -Educated on BP goals and benefits of  medications for prevention of heart attack, stroke and kidney damage; Daily salt intake goal < 2300 mg; Importance of home blood pressure monitoring; Proper BP monitoring technique; Symptoms of hypotension and importance of maintaining adequate hydration; -Counseled to monitor BP at home weekly, document, and provide log at future appointments -Counseled on diet and exercise extensively Recommended to continue current medication  Hyperlipidemia: (LDL goal < 70) -Not ideally controlled -Current treatment: Atorvastatin 20 mg 1 tablet daily -Medications previously tried: none  -Current dietary patterns: eats out some due to job -Current exercise habits: unable to with shortness of breath -Educated on Cholesterol goals;  Benefits of statin for ASCVD risk reduction; -Counseled on diet and exercise extensively Recommended to continue current medication  Diabetes (A1c goal <7%) -Not ideally controlled -Current medications: Novolin 70/30 inject 40 units twice daily with a meal - 50 in the morning and 40 units at night Ozempic 1  mg inject once weekly- never got it (don't have any more) - ran out last week Jardiance 10 mg 1 tablet daily -Medications previously tried: Geneticist, molecular (unknown) -Current home glucose readings fasting glucose: checking 3 times a day (varies with eating) post prandial glucose: n/a -Denies hypoglycemic/hyperglycemic symptoms -Current meal patterns:  breakfast: did not discuss in detail  lunch: did not discuss in detail  dinner: did not discuss in detail  snacks: did not discuss in detail  drinks: did not discuss in detail  -Current exercise: unable to right now -Educated on A1c and blood sugar goals; Benefits of routine self-monitoring of blood sugar; Continuous glucose monitoring; Carbohydrate counting and/or plate method -Counseled to check feet daily and get yearly eye exams -Counseled on diet and exercise extensively Recommended to continue current  medication Recommended starting continuous glucose monitoring. Assessed patient finances. Patient would qualify for Jardiance and Ozempic PAP.  Heart Failure (Goal: manage symptoms and prevent exacerbations) -Uncontrolled -Last ejection fraction: 20-25% (Date: 08/03/20) -HF type: Left Ventricular Failure -NYHA Class: III (marked limitation of activity) -AHA HF Stage: C (Heart disease and symptoms present) -Current treatment: Losartan 50 mg 1 tablet daily - taking 100 mg daily Carvedilol 6.25 mg 1 tablet twice daily with a meal Furosemide 20 mg 1 tablet daily - taking sparingly Digoxin 0.125 mg 1 tablet daily -Medications previously tried: none  -Current home BP/HR readings: not checking -Current dietary habits: tries not to add salt (fast food - tries to come home and eat when he can); eats deli meat wants faster food  -Current exercise habits: unable to and does not have energy -Educated on Benefits of medications for managing symptoms  and prolonging life Importance of weighing daily; if you gain more than 3 pounds in one day or 5 pounds in one week, call cardiology. Importance of blood pressure control -Counseled on diet and exercise extensively Recommended to continue current medication  Bipolar disorder(Goal: minimize symptoms) -Controlled -Current treatment: Lithium 300 mg 4 capsules daily Quetiapine 200 mg 2.5 tablets at bedtime -Medications previously tried/failed: n/a -PHQ9: 0 -GAD7: n/a -Educated on Benefits of medication for symptom control Benefits of cognitive-behavioral therapy with or without medication -Counseled on diet and exercise extensively Recommended to continue current medication  GERD (Goal: minimize symptoms) -Uncontrolled -Current treatment  Pantoprazole 20 mg 1 tablet twice daily before meals - not helping -Medications previously tried: omeprazole  -Collaborated with PCP to restart omeprazole 20 mg daily.  Health Maintenance -Vaccine gaps:  shingrix, Prevnar 20, COVID booster -Current therapy:  Sildenafil 50 mg as needed Albuterol HFA as needed -Educated on Cost vs benefit of each product must be carefully weighed by individual consumer -Patient is satisfied with current therapy and denies issues -Recommended to continue current medication  Patient Goals/Self-Care Activities Patient will:  - take medications as prescribed as evidenced by patient report and record review check glucose 3 times daily, document, and provide at future appointments check blood pressure weekly, document, and provide at future appointments  Follow Up Plan: The care management team will reach out to the patient again over the next 30 days.       Mr. Digioia was given information about Chronic Care Management services today including:  CCM service includes personalized support from designated clinical staff supervised by his physician, including individualized plan of care and coordination with other care providers 24/7 contact phone numbers for assistance for urgent and routine care needs. Standard insurance, coinsurance, copays and deductibles apply for chronic care management only during months in which we provide at least 20 minutes of these services. Most insurances cover these services at 100%, however patients may be responsible for any copay, coinsurance and/or deductible if applicable. This service may help you avoid the need for more expensive face-to-face services. Only one practitioner may furnish and bill the service in a calendar month. The patient may stop CCM services at any time (effective at the end of the month) by phone call to the office staff.  Patient agreed to services and verbal consent obtained.   The patient verbalized understanding of instructions, educational materials, and care plan provided today and agreed to receive a mailed copy of patient instructions, educational materials, and care plan.  The pharmacy team will  reach out to the patient again over the next 30 days.   Viona Gilmore, [facility]

## 2021-02-05 NOTE — Progress Notes (Signed)
Chronic Care Management Pharmacy Note  02/05/2021 Name:  Matthew Barnes MRN:  [id number] DOB:  [birth year]  Summary: A1c not at goal < 7% BP not at goal < 130/80  Recommendations/Changes made from today's visit: -Recommended routine BP and weight monitoring at home -Recommended taking furosemide at least every other day -Recommended switching pantoprazole to omeprazole 20 mg daily -Recommended prescribing CGM for blood sugar monitoring  Plan: Follow up BP and DM assessment in 1 month PAP for Ozempic and Jardiance   Subjective: Matthew Barnes is an 65 y.o. year old male who is a primary patient of Koberlein, Steele Berg, MD.  The CCM team was consulted for assistance with disease management and care coordination needs.    Engaged with patient by telephone for initial visit in response to provider referral for pharmacy case management and/or care coordination services.   Consent to Services:  The patient was given the following information about Chronic Care Management services today, agreed to services, and gave verbal consent: 1. CCM service includes personalized support from designated clinical staff supervised by the primary care provider, including individualized plan of care and coordination with other care providers 2. 24/7 contact phone numbers for assistance for urgent and routine care needs. 3. Service will only be billed when office clinical staff spend 20 minutes or more in a month to coordinate care. 4. Only one practitioner may furnish and bill the service in a calendar month. 5.The patient may stop CCM services at any time (effective at the end of the month) by phone call to the office staff. 6. The patient will be responsible for cost sharing (co-pay) of up to 20% of the service fee (after annual deductible is met). Patient agreed to services and consent obtained.  Patient Care Team: Caren Macadam, MD as PCP - General (Family Medicine) Lorretta Harp, MD as PCP -  Cardiology (Cardiology) Adegoroye, Wynona Luna, MD (Specialist) Elayne Snare, MD as Consulting Physician (Endocrinology) Viona Gilmore, [facility] as Pharmacist (Pharmacist)  Recent office visits: 01/30/21 Carolann Littler, MD: Patient presented for cough. Prescribed prednisone and albuterol inhaler.  01/04/2021 Micheline Rough MD - Patient was seen for Hypertension associated with diabetes and additional issues. Increased Losartan from 25 mg daily to 50 mg daily. Discontinued Empagliflozin and Thiamine. Recommended restarting Lasix. Placed referral for cardiology. Follow up in  3 months.    10/05/2020 Micheline Rough MD - Patient was seen for Hypertension associated with diabetes and additional issues. Follow up in 3 months.  Recent consult visits: 12/20/20 Brayton Caves (psychiatry): Patient presented for bipolar follow up. Unable to access notes.  12/13/2020 Elayne Snare MD (endocrinology) - Patient was seen for Uncontrolled type 2 diabetes mellitus and an additional issue. Started Ozempic 0.66m weekly to increase at 4 wks to 0.579mweekly. Follow up in 6 weeks.   11/01/2020 AjElayne SnareD (endocrinology) - Patient was seen for Uncontrolled type 2 diabetes mellitus and additional issues. Started Empagliflozin 10 mg daily. Follow up in 4 weeks.  10/04/20 Masoud Hejazi (psychiatry): Patient presented for bipolar follow up. Unable to access notes.  Hospital visits: Medication Reconciliation was completed by comparing discharge summary, patient's EMR and Pharmacy list, and upon discussion with patient.   [facility] to Systolic congestive heart failure. Discharge date was 10/03/2020. Time spent in ED was 5 hours.     [facility] Medication Changes at Hospital Discharge: None Medications Discontinued at  Hospital Discharge: None  -All other medications will remain the same.     Patient was seen  at [facility] ED on 10/01/2020 due to Acute congestive heart failure and additional issues. Discharge date was 10/02/2020. Time spent in ED was 2 hours.   New?Medications Started at [facility] Discharge:?? None Medication Changes at Hospital Discharge: None Medications Discontinued at Hospital Discharge: None  -All other medications will remain the same.     Objective:  Lab Results  Component Value Date   CREATININE 1.21 12/11/2020   BUN 8 12/11/2020   GFR 63.16 12/11/2020   GFRNONAA 50 (L) 10/02/2020   GFRAA >90 04/18/2011   NA 139 12/11/2020   K 4.0 12/11/2020   CALCIUM 8.7 12/11/2020   CO2 27 12/11/2020   GLUCOSE 311 (H) 12/11/2020    Lab Results  Component Value Date/Time   HGBA1C 7.3 (A) 11/01/2020 01:10 PM   HGBA1C 8.3 (H) 07/31/2020 04:09 AM   HGBA1C 6.3 05/21/2020 10:57 AM   FRUCTOSAMINE 331 (H) 12/11/2020 10:30 AM   FRUCTOSAMINE 310 (H) 05/21/2020 10:57 AM   GFR 63.16 12/11/2020 10:30 AM   GFR 54.16 (L) 05/21/2020 10:57 AM   MICROALBUR 1.1 07/14/2019 03:14 PM   MICROALBUR 3.0 (H) 08/18/2018 09:27 AM    Last diabetic Eye exam:  Lab Results  Component Value Date/Time   HMDIABEYEEXA No Retinopathy 01/11/2019 12:00 AM    Last diabetic Foot exam: No results found for: HMDIABFOOTEX   Lab Results  Component Value Date   CHOL 116 07/31/2020   HDL 24 (L) 07/31/2020   LDLCALC 79 07/31/2020   LDLDIRECT 73.0 09/01/2017   TRIG 65 07/31/2020   CHOLHDL 4.8 07/31/2020    Hepatic Function Latest Ref Rng & Units 10/02/2020 08/04/2020 08/03/2020  Total Protein 6.5 - 8.1 g/dL 6.9 6.4(L) 6.0(L)  Albumin 3.5 - 5.0 g/dL 4.1 3.4(L) 3.3(L)  AST 15 - 41 U/L 28 29 33  ALT 0 - 44 U/L 17 30 35  Alk Phosphatase 38 - 126 U/L 39 38 39  Total Bilirubin 0.3 - 1.2 mg/dL 0.7 0.4 0.5  Bilirubin, Direct 0.0 - 0.2 mg/dL 0.2 - -    Lab Results  Component Value Date/Time   TSH 1.21 12/11/2020 10:30 AM   TSH 4.41 05/21/2020 10:57 AM   FREET4 0.79 05/21/2020 10:57 AM    FREET4 0.86 08/18/2018 09:27 AM    CBC Latest Ref Rng & Units 10/02/2020 10/01/2020 08/07/2020  WBC 4.0 - 10.5 K/uL 5.9 6.6 5.2  Hemoglobin 13.0 - 17.0 g/dL 11.8(L) 13.2 13.5  Hematocrit 39.0 - 52.0 % 39.7 44.9 45.3  Platelets 150 - 400 K/uL 135(L) 174 165    No results found for: VD25OH  Clinical ASCVD: No  The ASCVD Risk score (Arnett DK, et al., 2019) failed to calculate for the following reasons:   The valid total cholesterol range is 130 to 320 mg/dL    Depression screen [facility] 2/9 10/05/2020 02/15/2020 08/12/2019  Decreased Interest 0 0 2  Down, Depressed, Hopeless 0 0 1  PHQ - 2 Score 0 0 3  Altered sleeping 0 - 0  Tired, decreased energy 0 - 1  Change in appetite 0 - 1  Feeling bad or failure about yourself  0 - 0  Trouble concentrating 0 - 1  Moving slowly or fidgety/restless 0 - 0  Suicidal thoughts 0 - 0  PHQ-9 Score 0 - 6  Some recent data might be hidden     Social History  Tobacco Use  Smoking Status Never  Smokeless Tobacco Never   BP Readings from Last 3 Encounters:  01/30/21 (!) 150/90  01/04/21 (!) 142/78  12/13/20 140/70   Pulse Readings from Last 3 Encounters:  01/30/21 (!) 117  01/04/21 98  12/13/20 84   Wt Readings from Last 3 Encounters:  01/30/21 (!) 305 lb 4.8 oz (138.5 kg)  01/04/21 299 lb 3.2 oz (135.7 kg)  12/13/20 294 lb 9.6 oz (133.6 kg)   BMI Readings from Last 3 Encounters:  01/30/21 40.28 kg/m  01/04/21 39.47 kg/m  12/13/20 38.87 kg/m    Assessment/Interventions: Review of patient past medical history, allergies, medications, health status, including review of consultants reports, laboratory and other test data, was performed as part of comprehensive evaluation and provision of chronic care management services.   SDOH:  (Social Determinants of Health) assessments and interventions performed: Yes SDOH Interventions    Flowsheet Row Most Recent Value  SDOH Interventions   Financial Strain Interventions Other (Comment)   [working on PAP]  Transportation Interventions Intervention Not Indicated      SDOH Screenings   Alcohol Screen: Not on file  Depression (PHQ2-9): Low Risk    PHQ-2 Score: 0  Financial Resource Strain: Low Risk    Difficulty of Paying Living Expenses: Not very hard  Food Insecurity: No Food Insecurity   Worried About Charity fundraiser in the Last Year: Never true   Ran Out of Food in the Last Year: Never true  Housing: Low Risk    Last Housing Risk Score: 0  Physical Activity: Insufficiently Active   Days of Exercise per Week: 5 days   Minutes of Exercise per Session: 20 min  Social Connections: Socially Isolated   Frequency of Communication with Friends and Family: Three times a week   Frequency of Social Gatherings with Friends and Family: Never   Attends Religious Services: Never   Marine scientist or Organizations: No   Attends Music therapist: Never   Marital Status: Never married  Stress: No Stress Concern Present   Feeling of Stress : Not at all  Tobacco Use: Low Risk    Smoking Tobacco Use: Never   Smokeless Tobacco Use: Never   Passive Exposure: Not on file  Transportation Needs: No Transportation Needs   Lack of Transportation (Medical): No   Lack of Transportation (Non-Medical): No   Patient saw Dr. Elease Hashimoto last week for a cough and he prescribed prednisone and an inhaler. Patient reports the prednisone did help but the inhaler did not help at all. Patient is feeling a little bit better and has never experienced that before. Patient has some wheezing in the hospital as well but didn't seem to be as bad.  Patient is not sure he is having issues with medications right now. He has too many medications and doesn't know what all of the medications are for or when medications are stopped/started. He has a hard time keeping track of them.  Patient is having issues with recall right now and is not sure what is going on with this. Patient reports  this has been going on for a few months.   Patient is working every day of the week for [employer] and is very busy with this.  CCM Care Plan  Allergies  Allergen Reactions   Influenza Vaccines Swelling and Other (See Comments)    Reports of swelling of the arm and then was "sick" for 10 days    Medications Reviewed  Today     Reviewed by Eulas Post, MD (Physician) on 01/30/21 at 1006  Med List Status: <None>   Medication Order Taking? Sig Documenting Provider Last Dose Status Informant  Accu-Chek Softclix Lancets lancets [id number] Yes Check blood sugar 2 times daily Elayne Snare, MD Taking Active   atorvastatin (LIPITOR) 20 MG tablet [id number] Yes TAKE 1 TABLET BY MOUTH EVERY DAY Koberlein, Junell C, MD Taking Active   Blood Glucose Monitoring Suppl (ACCU-CHEK GUIDE) w/Device KIT [id number] Yes Check blood sugar 2 times daily Elayne Snare, MD Taking Active   Blood Pressure Monitoring (BLOOD PRESSURE CUFF) [place] [id number] Yes Use as directed  Patient taking differently: 1 each by Other route as directed.   Lucretia Kern, DO Taking Active   carvedilol (COREG) 6.25 MG tablet [id number] Yes TAKE 1 TABLET BY MOUTH 2 TIMES DAILY WITH A MEAL. Caren Macadam, MD Taking Active   digoxin (LANOXIN) 0.125 MG tablet [id number] Yes TAKE 1 TABLET BY MOUTH DAILY Koberlein, Junell C, MD Taking Active   furosemide (LASIX) 20 MG tablet [id number] Yes Take 1 tablet (20 mg total) by mouth daily. Caren Macadam, MD Taking Active   glucose blood (ACCU-CHEK GUIDE) test strip [id number] Yes Use to check blood sugar 2 times daily Elayne Snare, MD Taking Active   glucose blood [facility] VERIO) test strip [id number] Yes USE AS INSTRUCTED TO CHECK BLOOD SUGAR 2 TIMES A DAY. Elayne Snare, MD Taking Active   insulin NPH-regular Human (NOVOLIN 70/30) (70-30) 100 UNIT/ML injection [id number] Yes Inject 40 Units into the skin 2 (two) times daily with a meal. Elayne Snare, MD Taking Active   Insulin Syringe-Needle U-100  (INSULIN SYRINGE 1CC/31GX5/16") 31G X 5/16" 1 ML MISC [id number] Yes Use 3 needles per day Elayne Snare, MD Taking Active   lithium carbonate 300 MG capsule [id number] Yes Take 3 capsules (900 mg total) by mouth daily.  Patient taking differently: Take 1,200 mg by mouth daily.   Caren Macadam, MD Taking Active   losartan (COZAAR) 50 MG tablet [id number] Yes Take 1 tablet (50 mg total) by mouth daily. Caren Macadam, MD Taking Active   pantoprazole (PROTONIX) 20 MG tablet [id number] Yes Take 1 tablet (20 mg total) by mouth 2 (two) times daily before a meal. TAKE 1 TABLET BY MOUTH TWICE A DAY BEFORE MEALS Koberlein, Junell C, MD Taking Active   QUEtiapine (SEROQUEL) 200 MG tablet [id number] Yes Take 400 mg by mouth at bedtime. [provider] Taking Active Pharmacy Records  Semaglutide [facility], 1 MG/DOSE, [place]) [id number] Yes Inject into the skin. [provider] Taking Active   sildenafil (VIAGRA) 50 MG tablet [id number] Yes Take 1 tablet (50 mg total) by mouth daily as needed for erectile dysfunction. Do not take more then once in 24 hours. Lucretia Kern, DO Taking Active Self            Patient Active Problem List   Diagnosis Date Noted   Dilated cardiomyopathy ([place])    Elevated troponin level not due to acute coronary syndrome    Acute on chronic systolic CHF (congestive heart failure) ([place]) 08/06/2020   AMS (altered mental status) 07/31/2020   Aphasia 07/31/2020   Difficulty with speech 07/30/2020   Chest pain 03/26/2018   Dyspnea on exertion 03/26/2018   Family history of heart disease 03/26/2018   Leg edema 10/27/2016   Bipolar affective, mixed ([place]) 04/29/2016   Esophageal reflux 01/17/2014   Type 2 diabetes mellitus with diabetic neuropathy, with long-term  current use of insulin ([place]) 08/31/2013   Hypertension associated with diabetes ([place]) 05/03/2013   Hyperlipidemia 05/03/2013   Hypothyroidism 05/03/2013    Immunization History  Administered Date(s)  Administered   Influenza,inj,Quad PF,6+ Mos 01/17/2014, 03/21/2020   Moderna Sars-Covid-2 Vaccination 08/30/2019, 10/04/2019, 02/29/2020   Pneumococcal Polysaccharide-23 01/17/2014    Conditions to be addressed/monitored:  Hypertension, Hyperlipidemia, Diabetes, Heart Failure, GERD, Hypothyroidism, and Bipolar disorder  Care Plan : [place]  Updates made by Viona Gilmore, [place] since 02/05/2021 12:00 AM     Problem: Problem: Hypertension, Hyperlipidemia, Diabetes, Heart Failure, GERD, Hypothyroidism, and Bipolar disorder      Long-Range Goal: Patient-Specific Goal   Start Date: 02/05/2021  Expected End Date: 02/05/2022  This Visit's Progress: On track  Priority: High  Note:   Current Barriers:  Unable to independently afford treatment regimen Unable to independently monitor therapeutic efficacy Unable to achieve control of diabetes  Unable to maintain control of blood pressure Unable to self administer medications as prescribed  Pharmacist Clinical Goal(s):  Patient will verbalize ability to afford treatment regimen achieve adherence to monitoring guidelines and medication adherence to achieve therapeutic efficacy achieve control of diabetes as evidenced by A1c maintain control of blood pressure as evidenced by home and office blood pressure readings  through collaboration with PharmD and provider.   Interventions: 1:1 collaboration with Caren Macadam, MD regarding development and update of comprehensive plan of care as evidenced by provider attestation and co-signature Inter-disciplinary care team collaboration (see longitudinal plan of care) Comprehensive medication review performed; medication list updated in electronic medical record  Hypertension (BP goal <130/80) -Uncontrolled -Current treatment: Losartan 50 mg 1 tablet daily - taking 100 mg daily -Medications previously tried: amlodipine  -Current home readings: does not check but has an arm  cuff -Current dietary habits: does not pay attention to sodium intake -Current exercise habits: unable to with shortness of breath -Denies hypotensive/hypertensive symptoms -Educated on BP goals and benefits of medications for prevention of heart attack, stroke and kidney damage; Daily salt intake goal < 2300 mg; Importance of home blood pressure monitoring; Proper BP monitoring technique; Symptoms of hypotension and importance of maintaining adequate hydration; -Counseled to monitor BP at home weekly, document, and provide log at future appointments -Counseled on diet and exercise extensively Recommended to continue current medication  Hyperlipidemia: (LDL goal < 70) -Not ideally controlled -Current treatment: Atorvastatin 20 mg 1 tablet daily -Medications previously tried: none  -Current dietary patterns: eats out some due to job -Current exercise habits: unable to with shortness of breath -Educated on Cholesterol goals;  Benefits of statin for ASCVD risk reduction; -Counseled on diet and exercise extensively Recommended to continue current medication  Diabetes (A1c goal <7%) -Not ideally controlled -Current medications: Novolin 70/30 inject 40 units twice daily with a meal - 50 in the morning and 40 units at night Ozempic 1  mg inject once weekly- never got it (don't have any more) - ran out last week Jardiance 10 mg 1 tablet daily -Medications previously tried: Geneticist, molecular (unknown) -Current home glucose readings fasting glucose: checking 3 times a day (varies with eating) post prandial glucose: n/a -Denies hypoglycemic/hyperglycemic symptoms -Current meal patterns:  breakfast: did not discuss in detail  lunch: did not discuss in detail  dinner: did not discuss in detail  snacks: did not discuss in detail  drinks: did not discuss in detail  -Current exercise: unable to right now -Educated on A1c and blood sugar goals; Benefits of routine self-monitoring  of blood  sugar; Continuous glucose monitoring; Carbohydrate counting and/or plate method -Counseled to check feet daily and get yearly eye exams -Counseled on diet and exercise extensively Recommended to continue current medication Recommended starting continuous glucose monitoring. Assessed patient finances. Patient would qualify for Jardiance and Ozempic PAP.  Heart Failure (Goal: manage symptoms and prevent exacerbations) -Uncontrolled -Last ejection fraction: 20-25% (Date: 08/03/20) -HF type: Left Ventricular Failure -NYHA Class: III (marked limitation of activity) -AHA HF Stage: C (Heart disease and symptoms present) -Current treatment: Losartan 50 mg 1 tablet daily - taking 100 mg daily Carvedilol 6.25 mg 1 tablet twice daily with a meal Furosemide 20 mg 1 tablet daily - taking sparingly Digoxin 0.125 mg 1 tablet daily -Medications previously tried: none  -Current home BP/HR readings: not checking -Current dietary habits: tries not to add salt (fast food - tries to come home and eat when he can); eats deli meat wants faster food  -Current exercise habits: unable to and does not have energy -Educated on Benefits of medications for managing symptoms and prolonging life Importance of weighing daily; if you gain more than 3 pounds in one day or 5 pounds in one week, call cardiology. Importance of blood pressure control -Counseled on diet and exercise extensively Recommended to continue current medication  Bipolar disorder(Goal: minimize symptoms) -Controlled -Current treatment: Lithium 300 mg 4 capsules daily Quetiapine 200 mg 2.5 tablets at bedtime -Medications previously tried/failed: n/a -PHQ9: 0 -GAD7: n/a -Educated on Benefits of medication for symptom control Benefits of cognitive-behavioral therapy with or without medication -Counseled on diet and exercise extensively Recommended to continue current medication  GERD (Goal: minimize symptoms) -Uncontrolled -Current treatment   Pantoprazole 20 mg 1 tablet twice daily before meals - not helping -Medications previously tried: omeprazole  -Collaborated with PCP to restart omeprazole 20 mg daily.  Health Maintenance -Vaccine gaps: shingrix, Prevnar 20, COVID booster -Current therapy:  Sildenafil 50 mg as needed Albuterol HFA as needed -Educated on Cost vs benefit of each product must be carefully weighed by individual consumer -Patient is satisfied with current therapy and denies issues -Recommended to continue current medication  Patient Goals/Self-Care Activities Patient will:  - take medications as prescribed as evidenced by patient report and record review check glucose 3 times daily, document, and provide at future appointments check blood pressure weekly, document, and provide at future appointments  Follow Up Plan: The care management team will reach out to the patient again over the next 30 days.         Medication Assistance: Application for Ozempic  medication assistance program. in process.  Anticipated assistance start date 03/07/21.  See plan of care for additional detail.  Compliance/Adherence/Medication fill history: Care Gaps: Last BP - 142/78 on 01/04/2021 Last A1C - 7.3 on 11/01/2020 Shingrix - never done Pneumovax - overdue Cologuard - overdue Covid vaccine - overdue Foot exam - overdue Eye exam - overdue  Star-Rating Drugs: Atorvastatin 20 mg - [facility]  Jardiance 10 mg - last filled 12/06/2020 [facility] Losartan 50 mg - last filled [facility]21/21 [facility] Novolin 70/30 100 units per/ml - last filled 01/27/21 to be picked up  Ozempic 1 mg dose - [facility] recently given by Dr Dwyane Dee Verified with Modena Nunnery at [place]  Patient's preferred [facility]  [facility] [place] [address] [place] [address] Phone: [phone] Fax: [phone]  Uses pill box? Yes  Pt endorses 80% compliance  We  discussed: Benefits of medication synchronization, packaging and delivery as well as enhanced pharmacist oversight with [facility]. Patient decided to: Utilize [facility] pharmacy for medication synchronization, packaging and delivery  Care Plan and Follow Up Patient Decision:  Patient agrees to Care Plan and Follow-up.  Plan: The care management team will reach out to the patient again over the next 30 days.  Jeni Salles, PharmD, [place] Pharmacist [place] [facility] [place]

## 2021-02-06 ENCOUNTER — Telehealth: Payer: Self-pay | Admitting: Pharmacist

## 2021-02-06 NOTE — Progress Notes (Signed)
    Chronic Care Management Pharmacy Assistant   Name: Matthew Barnes  MRN: [id number] DOB: September 04, 1955  Reason for Encounter: Call to patients insurance to see the cost of Dexcom meter or [employer]. Spoke with Randall An at [facility] she checked into DME coverage for [facility] and [facility], these two continuous glucose monitors and supplies are not covered under the patients plan.   I also called [facility] and the pharmacist ran these through patients insurance to see if they would be covered. Dexcom and Freestyle Libre came back as not covered by patients plan. She states that some medicare plans are starting to cover the continuous glucose monitors but not all of them.    Care Gaps: AWV scheduled for 02/27/2021 Last BP - 142/78 on 01/04/2021 Last A1C - 7.3 on 11/01/2020 Shingrix - never done Pneumovax - overdue Cologuard - overdue Covid vaccine - overdue Foot exam - overdue Eye exam - overdue  Star Rating Drugs: Atorvastatin 20 mg - [facility]  Jardiance 10 mg - last filled 12/06/2020 [facility] Losartan 50 mg - last filled [facility]/21/21 [facility] Novolin 70/30 100 units per/ml - last filled 01/27/21 to be picked up  Ozempic 1 mg dose [facility] given by Dr Dwyane Dee Verified with Modena Nunnery at [place] Pharmacist Assistant [phone]

## 2021-02-25 ENCOUNTER — Ambulatory Visit (INDEPENDENT_AMBULATORY_CARE_PROVIDER_SITE_OTHER): Payer: Medicare HMO

## 2021-02-25 ENCOUNTER — Other Ambulatory Visit: Payer: Self-pay | Admitting: Family Medicine

## 2021-02-25 VITALS — Ht 73.0 in | Wt 290.0 lb

## 2021-02-25 DIAGNOSIS — Z0000 Encounter for general adult medical examination without abnormal findings: Secondary | ICD-10-CM | POA: Diagnosis not present

## 2021-02-25 NOTE — Progress Notes (Signed)
I connected with Matthew Barnes today by telephone and verified that I am speaking with the correct person using two identifiers. Location patient: home Location provider: work Persons participating in the virtual visit: patient, provider.   I discussed the limitations, risks, security and privacy concerns of performing an evaluation and management service by telephone and the availability of in person appointments. I also discussed with the patient that there may be a patient responsible charge related to this service. The patient expressed understanding and verbally consented to this telephonic visit.    Interactive audio and video telecommunications were attempted between this provider and patient, however failed, due to patient having technical difficulties OR patient did not have access to video capability.  We continued and completed visit with audio only.     Vital signs may be patient reported or missing.  Subjective:   Matthew Barnes is a 65 y.o. male who presents for Medicare Annual/Subsequent preventive examination.  Review of Systems     Cardiac Risk Factors include: advanced age (>76mn, >>81women);diabetes mellitus;dyslipidemia;hypertension;male gender;obesity (BMI >30kg/m2)     Objective:    Today's Vitals   02/25/21 1111  Weight: 290 lb (131.5 kg)  Height: '6\' 1"'  (1.854 m)   Body mass index is 38.26 kg/m.  Advanced Directives 02/25/2021 10/02/2020 10/01/2020 07/30/2020 02/15/2020 08/12/2019 01/19/2018  Does Patient Have a Medical Advance Directive? No No No No No No No  Would patient like information on creating a medical advance directive? - No - Patient declined - No - Patient declined No - Patient declined - No - Patient declined    Current Medications (verified) Outpatient Encounter Medications as of 02/25/2021  Medication Sig   Accu-Chek Softclix Lancets lancets Check blood sugar 2 times daily   atorvastatin (LIPITOR) 20 MG tablet TAKE 1 TABLET BY MOUTH EVERY DAY    Blood Glucose Monitoring Suppl (ACCU-CHEK GUIDE) w/Device KIT Check blood sugar 2 times daily   carvedilol (COREG) 6.25 MG tablet TAKE 1 TABLET BY MOUTH 2 TIMES DAILY WITH A MEAL.   digoxin (LANOXIN) 0.125 MG tablet TAKE 1 TABLET BY MOUTH DAILY   empagliflozin (JARDIANCE) 10 MG TABS tablet Take by mouth daily.   furosemide (LASIX) 20 MG tablet Take 1 tablet (20 mg total) by mouth daily.   glucose blood (ACCU-CHEK GUIDE) test strip Use to check blood sugar 2 times daily   glucose blood (ONETOUCH VERIO) test strip USE AS INSTRUCTED TO CHECK BLOOD SUGAR 2 TIMES A DAY.   insulin NPH-regular Human (NOVOLIN 70/30) (70-30) 100 UNIT/ML injection Inject 40 Units into the skin 2 (two) times daily with a meal.   Insulin Syringe-Needle U-100 (INSULIN SYRINGE 1CC/31GX5/16") 31G X 5/16" 1 ML MISC Use 3 needles per day   lithium carbonate 300 MG capsule Take 3 capsules (900 mg total) by mouth daily. (Patient taking differently: Take 1,200 mg by mouth daily.)   losartan (COZAAR) 50 MG tablet Take 1 tablet (50 mg total) by mouth daily. (Patient taking differently: Take 50 mg by mouth daily. Taking 100 mg daily)   QUEtiapine (SEROQUEL) 200 MG tablet Take 550 mg by mouth at bedtime.   albuterol (VENTOLIN HFA) 108 (90 Base) MCG/ACT inhaler Inhale 2 puffs into the lungs every 6 (six) hours as needed for wheezing or shortness of breath. (Patient not taking: Reported on 02/05/2021)   benzonatate (TESSALON) 100 MG capsule Take 1 tablet by mouth every 8 hours as needed for cough.   Blood Pressure Monitoring (BLOOD PRESSURE CUFF) MISC Use as  directed (Patient taking differently: 1 each by Other route as directed.)   pantoprazole (PROTONIX) 20 MG tablet Take 1 tablet (20 mg total) by mouth 2 (two) times daily before a meal. TAKE 1 TABLET BY MOUTH TWICE A DAY BEFORE MEALS (Patient not taking: Reported on 02/05/2021)   Semaglutide (OZEMPIC, 1 MG/DOSE, [place]) Inject into the skin. (Patient not taking: Reported on 02/05/2021)    sildenafil (VIAGRA) 50 MG tablet Take 1 tablet (50 mg total) by mouth daily as needed for erectile dysfunction. Do not take more then once in 24 hours.   No facility-administered encounter medications on file as of 02/25/2021.    Allergies (verified) Influenza vaccines   History: Past Medical History:  Diagnosis Date   Bipolar II disorder - Managed by Marye Round [phone]) 05/03/2013   Diabetes mellitus    Dysphagia    Hyperlipidemia    Hypertension    Unspecified hypothyroidism 05/03/2013   Past Surgical History:  Procedure Laterality Date   KNEE SURGERY     scope on left knee   RIGHT/LEFT HEART CATH AND CORONARY ANGIOGRAPHY N/A 08/07/2020   Procedure: RIGHT/LEFT HEART CATH AND CORONARY ANGIOGRAPHY;  Surgeon: Leonie Man, MD;  Location: [facility];  Service: Cardiovascular;  Laterality: N/A;   Family History  Problem Relation Age of Onset   Diabetes Mother    Hypertension Mother    Heart disease Mother    Heart disease Father    Heart attack Father 38   Diabetes Father    Hypertension Father    Depression Father    Diabetes Sister    Depression Paternal Grandmother    Depression Other    Social History   Socioeconomic History   Marital status: Single    Spouse name: Not on file   Number of children: Not on file   Years of education: Not on file   Highest education level: Not on file  Occupational History   Not on file  Tobacco Use   Smoking status: Never   Smokeless tobacco: Never  Vaping Use   Vaping Use: Never used  Substance and Sexual Activity   Alcohol use: No   Drug use: No   Sexual activity: Not Currently  Other Topics Concern   Not on file  Social History Narrative   Work or School: on disability for knee arthritis      Home Situation: lives alone      Spiritual Beliefs:Christian      Lifestyle:no regular exercising; diet not great            Social Determinants of Health   Financial Resource Strain: Low Risk     Difficulty of Paying Living Expenses: Not hard at all  Food Insecurity: No Food Insecurity   Worried About Charity fundraiser in the Last Year: Never true   Arboriculturist in the Last Year: Never true  Transportation Needs: No Transportation Needs   Lack of Transportation (Medical): No   Lack of Transportation (Non-Medical): No  Physical Activity: Inactive   Days of Exercise per Week: 0 days   Minutes of Exercise per Session: 0 min  Stress: No Stress Concern Present   Feeling of Stress : Not at all  Social Connections: Not on file    Tobacco Counseling Counseling given: Not Answered   Clinical Intake:  Pre-visit preparation completed: Yes  Pain : No/denies pain     Nutritional Status: BMI > 30  Obese Nutritional Risks: None Diabetes: Yes  How often do you need to have someone help you when you read instructions, pamphlets, or other written materials from your doctor or pharmacy?: 1 - Never What is the last grade level you completed in school?: 12th grade  Diabetic? Yes Nutrition Risk Assessment:  Has the patient had any N/V/D within the last 2 months?  No  Does the patient have any non-healing wounds?  No  Has the patient had any unintentional weight loss or weight gain?  Yes   Diabetes:  Is the patient diabetic?  Yes  If diabetic, was a CBG obtained today?  No  Did the patient bring in their glucometer from home?  No  How often do you monitor your CBG's? 2-3 daily.   Financial Strains and Diabetes Management:  Are you having any financial strains with the device, your supplies or your medication? No .  Does the patient want to be seen by Chronic Care Management for management of their diabetes?  No  Would the patient like to be referred to a Nutritionist or for Diabetic Management?  No   Diabetic Exams:  Diabetic Eye Exam: Overdue for diabetic eye exam. Pt has been advised about the importance in completing this exam. Patient advised to call and schedule an  eye exam. Diabetic Foot Exam: Overdue, Pt has been advised about the importance in completing this exam. Pt is scheduled for diabetic foot exam on next appointment.   Interpreter Needed?: No  Information entered by :: NAllen LPN   Activities of Daily Living In your present state of health, do you have any difficulty performing the following activities: 02/25/2021 07/31/2020  Hearing? N -  Vision? N -  Difficulty concentrating or making decisions? N -  Walking or climbing stairs? N -  Comment - -  Dressing or bathing? N -  Doing errands, shopping? N Y  Conservation officer, nature and eating ? N -  Using the Toilet? N -  In the past six months, have you accidently leaked urine? Y -  Do you have problems with loss of bowel control? Y -  Managing your Medications? N -  Managing your Finances? N -  Housekeeping or managing your Housekeeping? N -  Some recent data might be hidden    Patient Care Team: Caren Macadam, MD as PCP - General (Family Medicine) Lorretta Harp, MD as PCP - Cardiology (Cardiology) Adegoroye, Wynona Luna, MD (Specialist) Elayne Snare, MD as Consulting Physician (Endocrinology) Viona Gilmore, [facility] as Pharmacist (Pharmacist)  Indicate any recent Medical Services you may have received from other than [facility] providers in the past year (date may be approximate).     Assessment:   This is a routine wellness examination for [place].  Hearing/Vision screen Vision Screening - Comments:: Regular eye exams, [facility]  Dietary issues and exercise activities discussed: Current Exercise Habits: The patient does not participate in regular exercise at present   Goals Addressed             This Visit's Progress    Patient Stated       02/25/2021, wants to lose 50 pounds       Depression Screen PHQ 2/9 Scores 02/25/2021 10/05/2020 02/15/2020 08/12/2019 07/11/2019 09/01/2017 08/28/2016  PHQ - 2 Score 0 0 0 '3 2 1 ' 0  PHQ- 9 Score - 0 - 6 5 - -    Fall Risk Fall Risk   02/25/2021 02/15/2020 08/12/2019 07/11/2019 09/01/2017  Falls in the past year? 0 0 1 1  Yes  Number falls in past yr: - 0 1 0 1  Comment - - - - about 4 months ago;   Injury with Fall? - 0 0 1 -  Risk for fall due to : Medication side effect Impaired vision - - -  Follow up Falls evaluation completed;Education provided;Falls prevention discussed Falls prevention discussed - - Education provided    FALL RISK PREVENTION PERTAINING TO THE HOME:  Any stairs in or around the home? No  If so, are there any without handrails? N/a Home free of loose throw rugs in walkways, pet beds, electrical cords, etc? Yes  Adequate lighting in your home to reduce risk of falls? Yes   ASSISTIVE DEVICES UTILIZED TO PREVENT FALLS:  Life alert? No  Use of a cane, walker or w/c? No  Grab bars in the bathroom? No  Shower chair or bench in shower? No  Elevated toilet seat or a handicapped toilet? No   TIMED UP AND GO:  Was the test performed? No .      Cognitive Function: MMSE - Mini Mental State Exam 09/01/2017  Not completed: (No Data)   Montreal Cognitive Assessment  06/04/2020  Visuospatial/ Executive (0/5) 0  Naming (0/3) 2  Attention: Read list of digits (0/2) 2  Attention: Read list of letters (0/1) 0  Attention: Serial 7 subtraction starting at 100 (0/3) 2  Language: Repeat phrase (0/2) 2  Language : Fluency (0/1) 1  Abstraction (0/2) 1  Delayed Recall (0/5) 2  Orientation (0/6) 5  Total 17  Adjusted Score (based on education) 18   6CIT Screen 02/25/2021  What Year? 0 points  What month? 0 points  What time? 0 points  Count back from 20 0 points  Months in reverse 2 points  Repeat phrase 0 points  Total Score 2    Immunizations Immunization History  Administered Date(s) Administered   Influenza,inj,Quad PF,6+ Mos 01/17/2014, 03/21/2020   Moderna Sars-Covid-2 Vaccination 08/30/2019, 10/04/2019, 02/29/2020   Pneumococcal Polysaccharide-23 01/17/2014    TDAP status: Up to  date  Flu Vaccine status: Declined, Education has been provided regarding the importance of this vaccine but patient still declined. Advised may receive this vaccine at local pharmacy or Health Dept. Aware to provide a copy of the vaccination record if obtained from local pharmacy or Health Dept. Verbalized acceptance and understanding.  Pneumococcal vaccine status: Due, Education has been provided regarding the importance of this vaccine. Advised may receive this vaccine at local pharmacy or Health Dept. Aware to provide a copy of the vaccination record if obtained from local pharmacy or Health Dept. Verbalized acceptance and understanding.  Covid-19 vaccine status: Completed vaccines  Qualifies for Shingles Vaccine? Yes   Zostavax completed No   Shingrix Completed?: No.    Education has been provided regarding the importance of this vaccine. Patient has been advised to call insurance company to determine out of pocket expense if they have not yet received this vaccine. Advised may also receive vaccine at local pharmacy or Health Dept. Verbalized acceptance and understanding.  Screening Tests Health Maintenance  Topic Date Due   Zoster Vaccines- Shingrix (1 of 2) Never done   Pneumococcal Vaccine 25-29 Years old (2 - PCV) 01/18/2015   Fecal DNA (Cologuard)  11/07/2019   COVID-19 Vaccine (4 - Booster for Moderna series) 04/25/2020   FOOT EXAM  07/13/2020   OPHTHALMOLOGY EXAM  12/29/2020   HEMOGLOBIN A1C  05/04/2021   TETANUS/TDAP  05/17/2024   Hepatitis C Screening  Completed  HIV Screening  Completed   HPV VACCINES  Aged Out   INFLUENZA VACCINE  Discontinued    Health Maintenance  Health Maintenance Due  Topic Date Due   Zoster Vaccines- Shingrix (1 of 2) Never done   Pneumococcal Vaccine 71-98 Years old (2 - PCV) 01/18/2015   Fecal DNA (Cologuard)  11/07/2019   COVID-19 Vaccine (4 - Booster for Moderna series) 04/25/2020   FOOT EXAM  07/13/2020   OPHTHALMOLOGY EXAM   12/29/2020    Colorectal cancer screening: Type of screening: Cologuard. Completed 11/06/2016. Repeat every 3 years  Lung Cancer Screening: (Low Dose CT Chest recommended if Age 58-80 years, 30 pack-year currently smoking OR have quit w/in 15years.) does not qualify.   Lung Cancer Screening Referral: no  Additional Screening:  Hepatitis C Screening: does qualify; Completed 08/28/2016  Vision Screening: Recommended annual ophthalmology exams for early detection of glaucoma and other disorders of the eye. Is the patient up to date with their annual eye exam?  No  Who is the provider or what is the name of the office in which the patient attends annual eye exams? [facility] If pt is not established with a provider, would they like to be referred to a provider to establish care? No .   Dental Screening: Recommended annual dental exams for proper oral hygiene  Community Resource Referral / Chronic Care Management: CRR required this visit?  No   CCM required this visit?  No      Plan:     I have personally reviewed and noted the following in the patient's chart:   Medical and social history Use of alcohol, tobacco or illicit drugs  Current medications and supplements including opioid prescriptions. Patient is not currently taking opioid prescriptions. Functional ability and status Nutritional status Physical activity Advanced directives List of other physicians Hospitalizations, surgeries, and ER visits in previous 12 months Vitals Screenings to include cognitive, depression, and falls Referrals and appointments  In addition, I have reviewed and discussed with patient certain preventive protocols, quality metrics, and best practice recommendations. A written personalized care plan for preventive services as well as general preventive health recommendations were provided to patient.     Kellie Simmering, LPN   [id number]   Nurse Notes: none

## 2021-02-25 NOTE — Patient Instructions (Signed)
Matthew Barnes , Thank you for taking time to come for your Medicare Wellness Visit. I appreciate your ongoing commitment to your health goals. Please review the following plan we discussed and let me know if I can assist you in the future.   Screening recommendations/referrals: Colonoscopy: has cologuard kit at home Recommended yearly ophthalmology/optometry visit for glaucoma screening and checkup Recommended yearly dental visit for hygiene and checkup  Vaccinations: Influenza vaccine: allergy Pneumococcal vaccine: due Tdap vaccine: completed 05/17/2014, due 05/17/2024 Shingles vaccine: discussed   Covid-19:  02/29/2020, 10/04/2019, 08/30/2019  Advanced directives: Advance directive discussed with you today.   Conditions/risks identified: none  Next appointment: Follow up in one year for your annual wellness visit   Preventive Care 40-64 Years, Male Preventive care refers to lifestyle choices and visits with your health care provider that can promote health and wellness. What does preventive care include? A yearly physical exam. This is also called an annual well check. Dental exams once or twice a year. Routine eye exams. Ask your health care provider how often you should have your eyes checked. Personal lifestyle choices, including: Daily care of your teeth and gums. Regular physical activity. Eating a healthy diet. Avoiding tobacco and drug use. Limiting alcohol use. Practicing safe sex. Taking low-dose aspirin every day starting at age 63. What happens during an annual well check? The services and screenings done by your health care provider during your annual well check will depend on your age, overall health, lifestyle risk factors, and family history of disease. Counseling  Your health care provider may ask you questions about your: Alcohol use. Tobacco use. Drug use. Emotional well-being. Home and relationship well-being. Sexual activity. Eating habits. Work and work  Statistician. Screening  You may have the following tests or measurements: Height, weight, and BMI. Blood pressure. Lipid and cholesterol levels. These may be checked every 5 years, or more frequently if you are over 61 years old. Skin check. Lung cancer screening. You may have this screening every year starting at age 27 if you have a 30-pack-year history of smoking and currently smoke or have quit within the past 15 years. Fecal occult blood test (FOBT) of the stool. You may have this test every year starting at age 37. Flexible sigmoidoscopy or colonoscopy. You may have a sigmoidoscopy every 5 years or a colonoscopy every 10 years starting at age 18. Prostate cancer screening. Recommendations will vary depending on your family history and other risks. Hepatitis C blood test. Hepatitis B blood test. Sexually transmitted disease (STD) testing. Diabetes screening. This is done by checking your blood sugar (glucose) after you have not eaten for a while (fasting). You may have this done every 1-3 years. Discuss your test results, treatment options, and if necessary, the need for more tests with your health care provider. Vaccines  Your health care provider may recommend certain vaccines, such as: Influenza vaccine. This is recommended every year. Tetanus, diphtheria, and acellular pertussis (Tdap, Td) vaccine. You may need a Td booster every 10 years. Zoster vaccine. You may need this after age 65. Pneumococcal 13-valent conjugate (PCV13) vaccine. You may need this if you have certain conditions and have not been vaccinated. Pneumococcal polysaccharide (PPSV23) vaccine. You may need one or two doses if you smoke cigarettes or if you have certain conditions. Talk to your health care provider about which screenings and vaccines you need and how often you need them. This information is not intended to replace advice given to you by your health  care provider. Make sure you discuss any questions you  have with your health care provider. Document Released: 04/13/2015 Document Revised: 12/05/2015 Document Reviewed: 01/16/2015 Elsevier Interactive Patient Education  2017 [place] Prevention in the Home Falls can cause injuries. They can happen to people of all ages. There are many things you can do to make your home safe and to help prevent falls. What can I do on the outside of my home? Regularly fix the edges of walkways and driveways and fix any cracks. Remove anything that might make you trip as you walk through a door, such as a raised step or threshold. Trim any bushes or trees on the path to your home. Use bright outdoor lighting. Clear any walking paths of anything that might make someone trip, such as rocks or tools. Regularly check to see if handrails are loose or broken. Make sure that both sides of any steps have handrails. Any raised decks and porches should have guardrails on the edges. Have any leaves, snow, or ice cleared regularly. Use sand or salt on walking paths during winter. Clean up any spills in your garage right away. This includes oil or grease spills. What can I do in the bathroom? Use night lights. Install grab bars by the toilet and in the tub and shower. Do not use towel bars as grab bars. Use non-skid mats or decals in the tub or shower. If you need to sit down in the shower, use a plastic, non-slip stool. Keep the floor dry. Clean up any water that spills on the floor as soon as it happens. Remove soap buildup in the tub or shower regularly. Attach bath mats securely with double-sided non-slip rug tape. Do not have throw rugs and other things on the floor that can make you trip. What can I do in the bedroom? Use night lights. Make sure that you have a light by your bed that is easy to reach. Do not use any sheets or blankets that are too big for your bed. They should not hang down onto the floor. Have a firm chair that has side arms. You can  use this for support while you get dressed. Do not have throw rugs and other things on the floor that can make you trip. What can I do in the kitchen? Clean up any spills right away. Avoid walking on wet floors. Keep items that you use a lot in easy-to-reach places. If you need to reach something above you, use a strong step stool that has a grab bar. Keep electrical cords out of the way. Do not use floor polish or wax that makes floors slippery. If you must use wax, use non-skid floor wax. Do not have throw rugs and other things on the floor that can make you trip. What can I do with my stairs? Do not leave any items on the stairs. Make sure that there are handrails on both sides of the stairs and use them. Fix handrails that are broken or loose. Make sure that handrails are as long as the stairways. Check any carpeting to make sure that it is firmly attached to the stairs. Fix any carpet that is loose or worn. Avoid having throw rugs at the top or bottom of the stairs. If you do have throw rugs, attach them to the floor with carpet tape. Make sure that you have a light switch at the top of the stairs and the bottom of the stairs. If you do not  have them, ask someone to add them for you. What else can I do to help prevent falls? Wear shoes that: Do not have high heels. Have rubber bottoms. Are comfortable and fit you well. Are closed at the toe. Do not wear sandals. If you use a stepladder: Make sure that it is fully opened. Do not climb a closed stepladder. Make sure that both sides of the stepladder are locked into place. Ask someone to hold it for you, if possible. Clearly mark and make sure that you can see: Any grab bars or handrails. First and last steps. Where the edge of each step is. Use tools that help you move around (mobility aids) if they are needed. These include: Canes. Walkers. Scooters. Crutches. Turn on the lights when you go into a dark area. Replace any light  bulbs as soon as they burn out. Set up your furniture so you have a clear path. Avoid moving your furniture around. If any of your floors are uneven, fix them. If there are any pets around you, be aware of where they are. Review your medicines with your doctor. Some medicines can make you feel dizzy. This can increase your chance of falling. Ask your doctor what other things that you can do to help prevent falls. This information is not intended to replace advice given to you by your health care provider. Make sure you discuss any questions you have with your health care provider. Document Released: 01/11/2009 Document Revised: 08/23/2015 Document Reviewed: 04/21/2014 Elsevier Interactive Patient Education  2017 [employer].

## 2021-02-27 ENCOUNTER — Ambulatory Visit: Payer: Medicare HMO

## 2021-02-27 DIAGNOSIS — E1140 Type 2 diabetes mellitus with diabetic neuropathy, unspecified: Secondary | ICD-10-CM | POA: Diagnosis not present

## 2021-02-27 DIAGNOSIS — I110 Hypertensive heart disease with heart failure: Secondary | ICD-10-CM | POA: Diagnosis not present

## 2021-02-27 DIAGNOSIS — Z794 Long term (current) use of insulin: Secondary | ICD-10-CM | POA: Diagnosis not present

## 2021-02-27 DIAGNOSIS — E1159 Type 2 diabetes mellitus with other circulatory complications: Secondary | ICD-10-CM

## 2021-02-28 ENCOUNTER — Ambulatory Visit (INDEPENDENT_AMBULATORY_CARE_PROVIDER_SITE_OTHER): Payer: Medicare HMO | Admitting: Family Medicine

## 2021-02-28 ENCOUNTER — Other Ambulatory Visit: Payer: Self-pay

## 2021-02-28 ENCOUNTER — Encounter: Payer: Self-pay | Admitting: Family Medicine

## 2021-02-28 ENCOUNTER — Ambulatory Visit (INDEPENDENT_AMBULATORY_CARE_PROVIDER_SITE_OTHER): Payer: Medicare HMO

## 2021-02-28 VITALS — BP 124/76 | Temp 98.8°F | Wt 295.0 lb

## 2021-02-28 DIAGNOSIS — R059 Cough, unspecified: Secondary | ICD-10-CM | POA: Diagnosis not present

## 2021-02-28 DIAGNOSIS — Z794 Long term (current) use of insulin: Secondary | ICD-10-CM

## 2021-02-28 DIAGNOSIS — R5383 Other fatigue: Secondary | ICD-10-CM | POA: Diagnosis not present

## 2021-02-28 DIAGNOSIS — F3181 Bipolar II disorder: Secondary | ICD-10-CM

## 2021-02-28 DIAGNOSIS — I10 Essential (primary) hypertension: Secondary | ICD-10-CM

## 2021-02-28 DIAGNOSIS — R350 Frequency of micturition: Secondary | ICD-10-CM

## 2021-02-28 DIAGNOSIS — E1140 Type 2 diabetes mellitus with diabetic neuropathy, unspecified: Secondary | ICD-10-CM

## 2021-02-28 DIAGNOSIS — R413 Other amnesia: Secondary | ICD-10-CM | POA: Diagnosis not present

## 2021-02-28 DIAGNOSIS — I517 Cardiomegaly: Secondary | ICD-10-CM | POA: Diagnosis not present

## 2021-02-28 LAB — COMPREHENSIVE METABOLIC PANEL
ALT: 18 U/L (ref 0–53)
AST: 21 U/L (ref 0–37)
Albumin: 4 g/dL (ref 3.5–5.2)
Alkaline Phosphatase: 51 U/L (ref 39–117)
BUN: 11 mg/dL (ref 6–23)
CO2: 28 mEq/L (ref 19–32)
Calcium: 9.1 mg/dL (ref 8.4–10.5)
Chloride: 100 mEq/L (ref 96–112)
Creatinine, Ser: 1.42 mg/dL (ref 0.40–1.50)
GFR: 52.05 mL/min — ABNORMAL LOW (ref >60.00)
Glucose, Bld: 397 mg/dL — ABNORMAL HIGH (ref 70–99)
Potassium: 4.4 mEq/L (ref 3.5–5.1)
Sodium: 135 mEq/L (ref 135–145)
Total Bilirubin: 0.7 mg/dL (ref 0.2–1.2)
Total Protein: 6.7 g/dL (ref 6.0–8.3)

## 2021-02-28 LAB — POCT URINALYSIS DIPSTICK
Bilirubin, UA: NEGATIVE
Blood, UA: NEGATIVE
Glucose, UA: POSITIVE — AB
Ketones, UA: NEGATIVE
Leukocytes, UA: NEGATIVE
Nitrite, UA: NEGATIVE
Protein, UA: NEGATIVE
Spec Grav, UA: 1.015 (ref 1.010–1.025)
Urobilinogen, UA: NEGATIVE E.U./dL — AB
pH, UA: 6 (ref 5.0–8.0)

## 2021-02-28 LAB — CBC WITH DIFFERENTIAL/PLATELET
Basophils Absolute: 0 10*3/uL (ref 0.0–0.1)
Basophils Relative: 0.8 % (ref 0.0–3.0)
Eosinophils Absolute: 0.2 10*3/uL (ref 0.0–0.7)
Eosinophils Relative: 4.2 % (ref 0.0–5.0)
HCT: 39.8 % (ref 39.0–52.0)
Hemoglobin: 12.7 g/dL — ABNORMAL LOW (ref 13.0–17.0)
Lymphocytes Relative: 15.9 % (ref 12.0–46.0)
Lymphs Abs: 0.9 10*3/uL (ref 0.7–4.0)
MCHC: 31.9 g/dL (ref 30.0–36.0)
MCV: 81.5 fl (ref 78.0–100.0)
Monocytes Absolute: 0.5 10*3/uL (ref 0.1–1.0)
Monocytes Relative: 9.1 % (ref 3.0–12.0)
Neutro Abs: 3.8 10*3/uL (ref 1.4–7.7)
Neutrophils Relative %: 70 % (ref 43.0–77.0)
Platelets: 116 10*3/uL — ABNORMAL LOW (ref 150.0–400.0)
RBC: 4.89 Mil/uL (ref 4.22–5.81)
RDW: 15 % (ref 11.5–15.5)
WBC: 5.4 10*3/uL (ref 4.0–10.5)

## 2021-02-28 LAB — BASIC METABOLIC PANEL
BUN: 11 mg/dL (ref 6–23)
CO2: 28 mEq/L (ref 19–32)
Calcium: 9.1 mg/dL (ref 8.4–10.5)
Chloride: 100 mEq/L (ref 96–112)
Creatinine, Ser: 1.42 mg/dL (ref 0.40–1.50)
GFR: 52.05 mL/min — ABNORMAL LOW (ref >60.00)
Glucose, Bld: 397 mg/dL — ABNORMAL HIGH (ref 70–99)
Potassium: 4.4 mEq/L (ref 3.5–5.1)
Sodium: 135 mEq/L (ref 135–145)

## 2021-02-28 LAB — FOLATE: Folate: 16.1 ng/mL (ref >5.9)

## 2021-02-28 LAB — VITAMIN B12: Vitamin B-12: 277 pg/mL (ref 211–911)

## 2021-02-28 LAB — VITAMIN D 25 HYDROXY (VIT D DEFICIENCY, FRACTURES): VITD: 43.46 ng/mL (ref 30.00–100.00)

## 2021-02-28 LAB — MICROALBUMIN / CREATININE URINE RATIO
Creatinine,U: 62.1 mg/dL
Microalb Creat Ratio: 3 mg/g (ref 0.0–30.0)
Microalb, Ur: 1.9 mg/dL (ref 0.0–1.9)

## 2021-02-28 LAB — GLUCOSE, POCT (MANUAL RESULT ENTRY): POC Glucose: 300 mg/dl — AB (ref 70–99)

## 2021-02-28 LAB — BRAIN NATRIURETIC PEPTIDE: Pro B Natriuretic peptide (BNP): 93 pg/mL (ref 0.0–100.0)

## 2021-02-28 LAB — TSH: TSH: 1.28 u[IU]/mL (ref 0.35–5.50)

## 2021-02-28 IMAGING — DX DG CHEST 2V
2 series · 2 of 2 positions shown · non-contrast
Comparison: Chest x-ray [DATE]

CLINICAL DATA: Cough

EXAM:
CHEST - 2 VIEW

[chest pa]
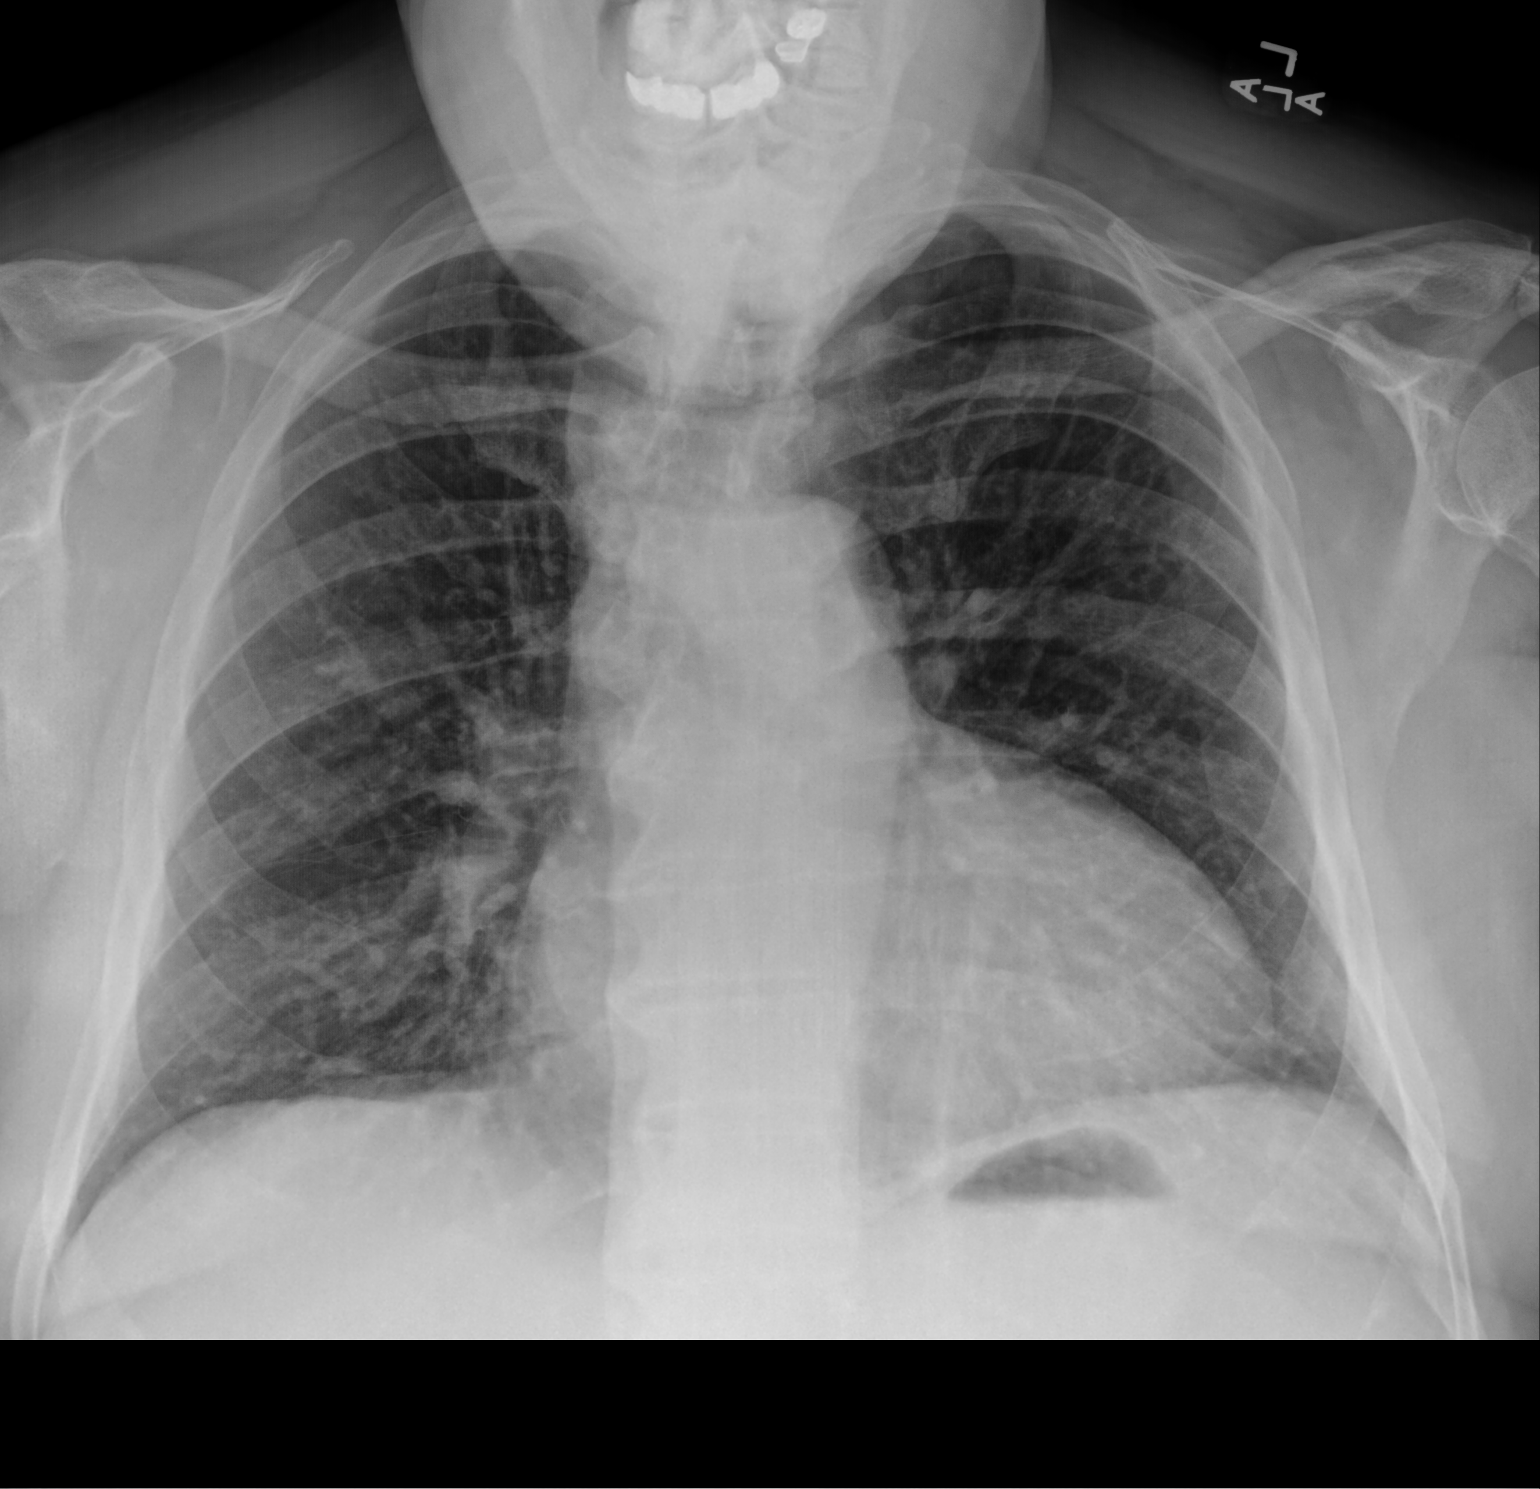

[chest lat]
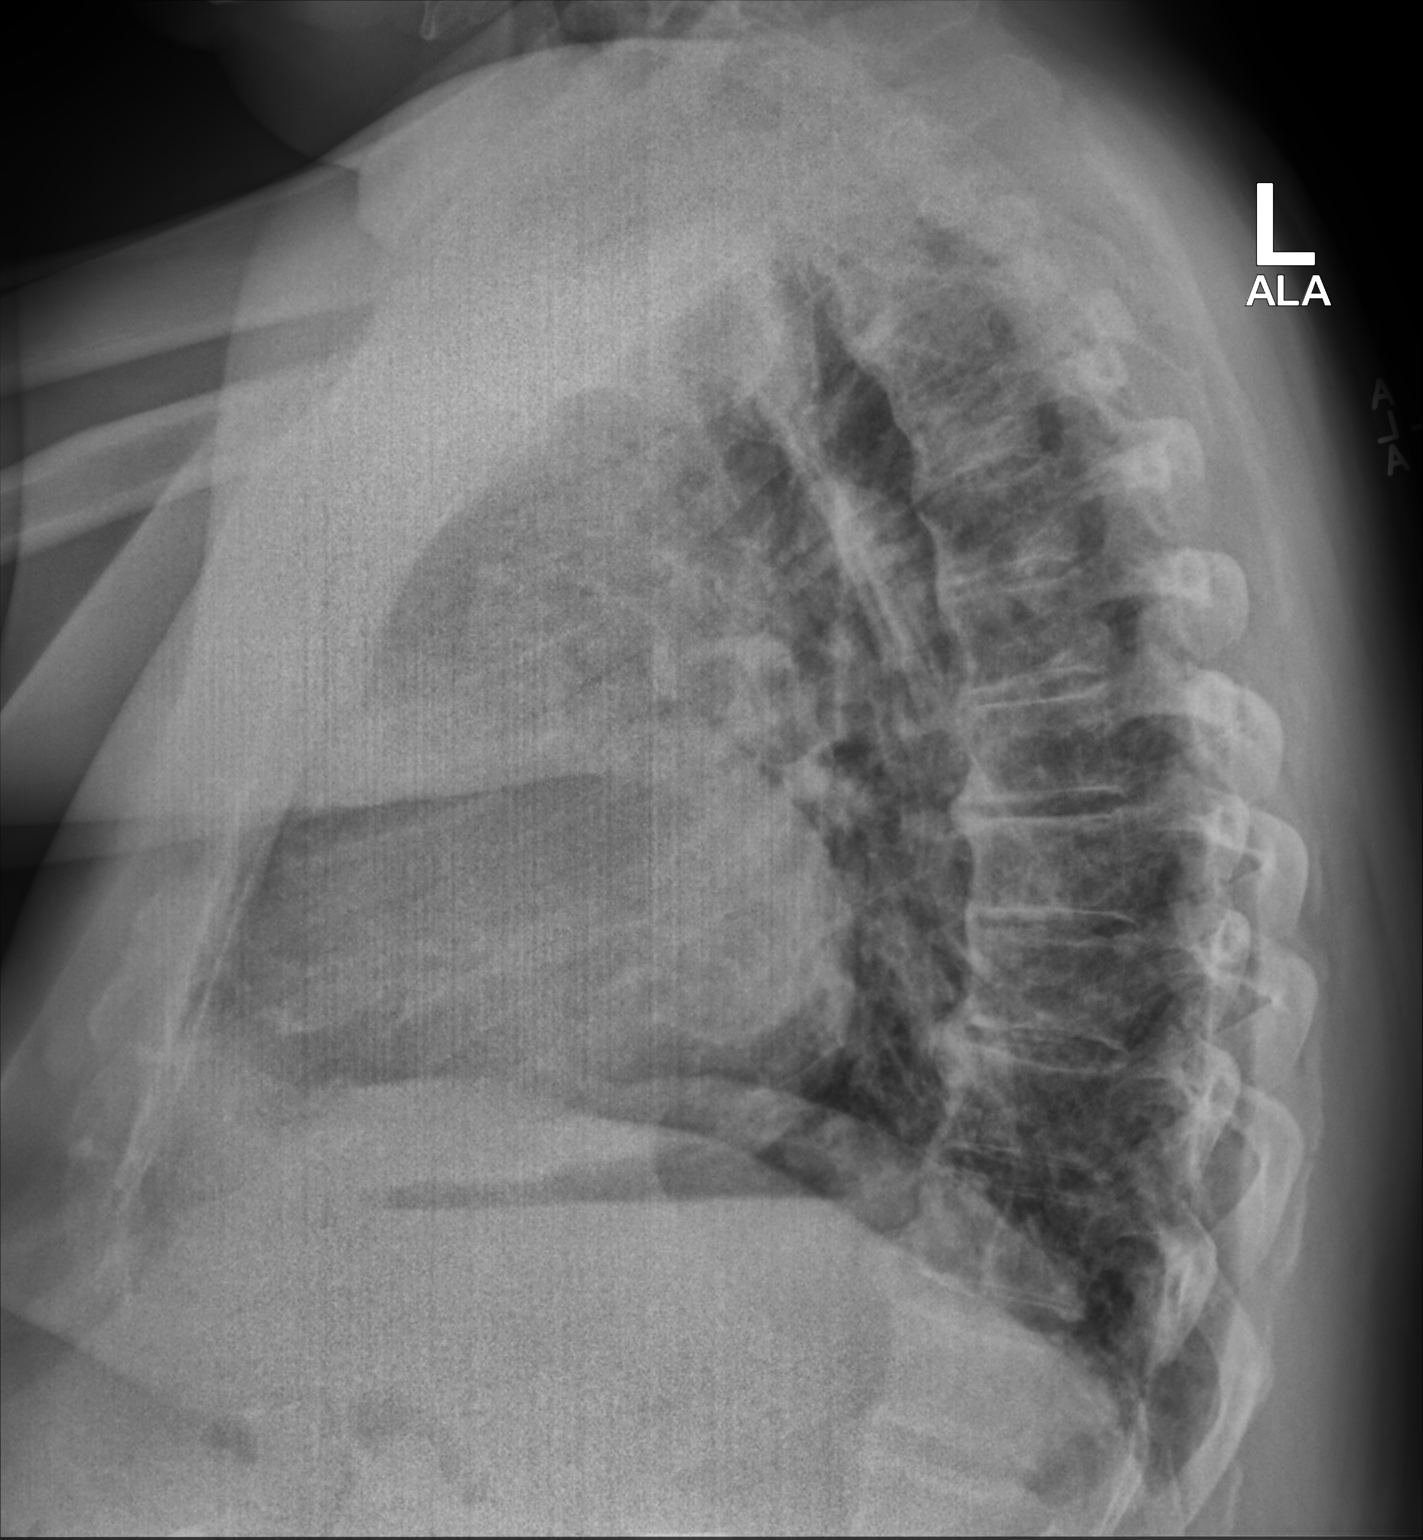

[2 of 2 positions shown; findings below may reference images not displayed]

FINDINGS: Heart is enlarged. Mediastinum appears stable. Pulmonary vasculature
is within normal limits. No focal consolidation identified. No
pleural effusion or pneumothorax.
IMPRESSION: Cardiomegaly with no acute process identified.

## 2021-02-28 MED ORDER — AMOXICILLIN-POT CLAVULANATE 500-125 MG PO TABS: 1.0000 | ORAL_TABLET | Freq: Two times a day (BID) | ORAL | 0 refills | Status: AC

## 2021-02-28 MED ORDER — BENZONATATE 100 MG PO CAPS: 100.0000 mg | ORAL_CAPSULE | Freq: Two times a day (BID) | ORAL | 0 refills | Status: DC | PRN

## 2021-02-28 MED ORDER — FLUTICASONE PROPIONATE 50 MCG/ACT NA SUSP: 1.0000 | Freq: Every day | NASAL | 0 refills | Status: DC

## 2021-02-28 NOTE — Progress Notes (Signed)
Subjective:    Patient ID: Matthew Barnes, male    DOB: 12/18/1955, 65 y.o.   MRN: [id number]  Chief Complaint  Patient presents with   Cough    Cough and congestion for 4 weeks. Cough is productive.   Urinary Frequency    Also a few weeks, no pain   Chest Pain    Mid chest, left side about 4 weeks also    HPI Patient is a 65 year old male withpmh sig for chronic systolic CHF, dilated cardiomyopathy, DM 2 with neuropathy, bipolar affective, mixed, hypothyroidism HLD who is followed by Dr. Ethlyn Gallery and seen today for ongoing concern.  Patient endorses cough, fatigue, urinary frequency, and rhinorrhea x4 weeks.  Patient tried Mucinex for symptoms.  Patient denies fever, nausea, vomiting, headache, wheezing, ear pain/pressure, facial pain/pressure, sore throat, diarrhea, SOB, orthopnea.  Patient states his blood sugar at home has been 160 in the morning.  He did not check his blood sugar this morning.  Past Medical History:  Diagnosis Date   Bipolar II disorder - Managed by Marye Round [phone]) 05/03/2013   Diabetes mellitus    Dysphagia    Hyperlipidemia    Hypertension    Unspecified hypothyroidism 05/03/2013    Allergies  Allergen Reactions   Influenza Vaccines Swelling and Other (See Comments)    Reports of swelling of the arm and then was "sick" for 10 days    ROS General: Denies fever, chills, night sweats, changes in weight, changes in appetite +fatigue HEENT: Denies headaches, ear pain, changes in vision, sore throat +rhinorrhea CV: Denies CP, palpitations, SOB, orthopnea  Pulm: Denies SOB, cough, wheezing +cough GI: Denies abdominal pain, nausea, vomiting, diarrhea, constipation GU: Denies dysuria, hematuria, frequency Msk: Denies muscle cramps, joint pains Neuro: Denies weakness, numbness, tingling Skin: Denies rashes, bruising Psych: Denies depression, anxiety, hallucinations     Objective:    Blood pressure 124/76, temperature 98.8 F (37.1 C),  temperature source Oral, weight 295 lb (133.8 kg), SpO2 90 %.  Gen. Pleasant, well-nourished, in no distress, normal affect   HEENT: [place]/AT, face symmetric, conjunctiva clear, no scleral icterus, PERRLA, EOMI, nares patent without drainage, pharynx without erythema or exudate.  TMs normal bilaterally.  Cerumen in the right canal. Lungs: Cough, no accessory muscle use, CTAB, no wheezes or rales Cardiovascular: RRR, no m/r/g, no peripheral edema Musculoskeletal: No deformities, no cyanosis or clubbing, normal tone Neuro:  A&Ox3, CN II-XII intact, normal gait Skin:  Warm, no lesions/ rash   Wt Readings from Last 3 Encounters:  02/28/21 295 lb (133.8 kg)  02/25/21 290 lb (131.5 kg)  01/30/21 (!) 305 lb 4.8 oz (138.5 kg)    Lab Results  Component Value Date   WBC 5.9 10/02/2020   HGB 11.8 (L) 10/02/2020   HCT 39.7 10/02/2020   PLT 135 (L) 10/02/2020   GLUCOSE 311 (H) 12/11/2020   CHOL 116 07/31/2020   TRIG 65 07/31/2020   HDL 24 (L) 07/31/2020   LDLDIRECT 73.0 09/01/2017   LDLCALC 79 07/31/2020   ALT 17 10/02/2020   AST 28 10/02/2020   NA 139 12/11/2020   K 4.0 12/11/2020   CL 105 12/11/2020   CREATININE 1.21 12/11/2020   BUN 8 12/11/2020   CO2 27 12/11/2020   TSH 1.21 12/11/2020   PSA 1.90 09/04/2014   HGBA1C 7.3 (A) 11/01/2020   MICROALBUR 1.1 07/14/2019    Assessment/Plan:  Cough, unspecified type  -Concern for pneumonia, CHF exacerbation, bronchitis, etc. -Supportive care -Obtain CXR -Would avoid prednisone  given patient's current hyperglycemia-POC FSBS 300 -Further recommendations based on results. --Given x-ray results we will treat for bronchitis.  Discussed inhaler however patient declines at this time.  We will start Augmentin, Tessalon, and Flonase. - Plan: DG Chest 2 View, Brain Natriuretic Peptide  Other fatigue  -discussed possible causes including UTI, pna, hyperglycemia, CHF exacerbation -labs and imaging -further recs based on results. -Based on  results fatigue likely 2/2 hyperglycemia. - Plan: POCT urinalysis dipstick, DG Chest 2 View, CBC with Differential/Platelet, Basic metabolic panel  Urinary frequency -Discussed UTI versus hyperglycemia -Obtain UA.   -3+glucose on UA - Plan: POCT urinalysis dipstick  Type 2 diabetes mellitus with diabetic neuropathy, with long-term current use of insulin (HCC) -Uncontrolled -POC glucose 300 -Glucose from blood draw 397 this visit. -Patient advised on the importance of taking medications regularly. -Hemoglobin A1c 7.3% on 11/01/2020 -Continue follow-up with endocrinology - Plan: Basic metabolic panel, POC glucose, microalbumin/creat  Update: Pt called regarding labs and chest x-ray.  States had a good breakfast this am and did not take any insulin.   Taking Novolin 70/30 50 units in am and 40 units in pm.  Advised on importance of taking medications regularly.  Discussed prescription sent to pharmacy.  Given precautions.  F/u prn with PCP  Grier Mitts, MD

## 2021-03-01 LAB — LITHIUM LEVEL: Lithium Lvl: 0.7 mmol/L (ref 0.6–1.2)

## 2021-03-08 DIAGNOSIS — M9902 Segmental and somatic dysfunction of thoracic region: Secondary | ICD-10-CM | POA: Diagnosis not present

## 2021-03-08 DIAGNOSIS — M546 Pain in thoracic spine: Secondary | ICD-10-CM | POA: Diagnosis not present

## 2021-03-08 DIAGNOSIS — M9901 Segmental and somatic dysfunction of cervical region: Secondary | ICD-10-CM | POA: Diagnosis not present

## 2021-03-08 DIAGNOSIS — M62830 Muscle spasm of back: Secondary | ICD-10-CM | POA: Diagnosis not present

## 2021-03-11 ENCOUNTER — Ambulatory Visit: Payer: Medicare HMO | Admitting: Cardiology

## 2021-03-14 DIAGNOSIS — F3132 Bipolar disorder, current episode depressed, moderate: Secondary | ICD-10-CM | POA: Diagnosis not present

## 2021-04-09 ENCOUNTER — Other Ambulatory Visit: Payer: Self-pay

## 2021-04-09 ENCOUNTER — Other Ambulatory Visit (INDEPENDENT_AMBULATORY_CARE_PROVIDER_SITE_OTHER): Payer: Medicare HMO

## 2021-04-09 ENCOUNTER — Other Ambulatory Visit: Payer: Self-pay | Admitting: Endocrinology

## 2021-04-09 DIAGNOSIS — E1165 Type 2 diabetes mellitus with hyperglycemia: Secondary | ICD-10-CM

## 2021-04-09 DIAGNOSIS — Z794 Long term (current) use of insulin: Secondary | ICD-10-CM

## 2021-04-09 LAB — HEMOGLOBIN A1C: Hgb A1c MFr Bld: 7.5 % — ABNORMAL HIGH (ref 4.6–6.5)

## 2021-04-09 LAB — BASIC METABOLIC PANEL
BUN: 17 mg/dL (ref 6–23)
CO2: 31 mEq/L (ref 19–32)
Calcium: 9.6 mg/dL (ref 8.4–10.5)
Chloride: 106 mEq/L (ref 96–112)
Creatinine, Ser: 1.75 mg/dL — ABNORMAL HIGH (ref 0.40–1.50)
GFR: 40.47 mL/min — ABNORMAL LOW (ref >60.00)
Glucose, Bld: 69 mg/dL — ABNORMAL LOW (ref 70–99)
Potassium: 4.4 mEq/L (ref 3.5–5.1)
Sodium: 140 mEq/L (ref 135–145)

## 2021-04-10 DIAGNOSIS — E119 Type 2 diabetes mellitus without complications: Secondary | ICD-10-CM | POA: Diagnosis not present

## 2021-04-10 DIAGNOSIS — Z0100 Encounter for examination of eyes and vision without abnormal findings: Secondary | ICD-10-CM | POA: Diagnosis not present

## 2021-04-11 ENCOUNTER — Other Ambulatory Visit: Payer: Medicare HMO

## 2021-04-12 ENCOUNTER — Encounter: Payer: Self-pay | Admitting: Cardiology

## 2021-04-12 ENCOUNTER — Ambulatory Visit: Payer: Medicare HMO | Admitting: Cardiology

## 2021-04-12 ENCOUNTER — Other Ambulatory Visit: Payer: Self-pay

## 2021-04-12 VITALS — BP 120/80 | HR 87 | Ht 73.0 in | Wt 303.0 lb

## 2021-04-12 DIAGNOSIS — F3160 Bipolar disorder, current episode mixed, unspecified: Secondary | ICD-10-CM | POA: Diagnosis not present

## 2021-04-12 DIAGNOSIS — I5042 Chronic combined systolic (congestive) and diastolic (congestive) heart failure: Secondary | ICD-10-CM | POA: Diagnosis not present

## 2021-04-12 DIAGNOSIS — I2729 Other secondary pulmonary hypertension: Secondary | ICD-10-CM | POA: Diagnosis not present

## 2021-04-12 DIAGNOSIS — N1832 Chronic kidney disease, stage 3b: Secondary | ICD-10-CM | POA: Diagnosis not present

## 2021-04-12 DIAGNOSIS — I272 Pulmonary hypertension, unspecified: Secondary | ICD-10-CM | POA: Insufficient documentation

## 2021-04-12 DIAGNOSIS — I5022 Chronic systolic (congestive) heart failure: Secondary | ICD-10-CM

## 2021-04-12 MED ORDER — CARVEDILOL 12.5 MG PO TABS: 12.5000 mg | ORAL_TABLET | Freq: Two times a day (BID) | ORAL | 3 refills | Status: DC

## 2021-04-12 NOTE — Assessment & Plan Note (Signed)
Matthew Barnes follows him.

## 2021-04-12 NOTE — Assessment & Plan Note (Signed)
Continue to discuss weight loss.  Decrease carbohydrates.

## 2021-04-12 NOTE — Assessment & Plan Note (Addendum)
Increase carvedilol to 12.5 mg twice a day.  Continue with Jardiance 10 mg a day.  Losartan 50 mg a day.  Unable to utilize [place] because of creatinine of 1.7.  On digoxin 0.125 mg.  Low-dose.  Unable to utilize spironolactone because of elevated creatinine as well.  Continue with furosemide 40.  Salt and fluid restrictions discussed.  Daily exercise.  Weight loss.  Currently NYHA class II-III  Repeating echocardiogram.  If EF still remains low, 20 to 25% or less than 35%, we will refer to EP for evaluation of ICD.  His QRS duration is 106 ms.  Close follow-up in 3 months.  APP.

## 2021-04-12 NOTE — Assessment & Plan Note (Signed)
On losartan.  Dr. Dwyane Dee following as well.

## 2021-04-12 NOTE — Progress Notes (Signed)
Cardiology Office Note:    Date:  04/12/2021   ID:  Matthew, Barnes November 11, 1955, MRN [id number]  PCP:  Caren Macadam, MD   [facility] Providers Cardiologist:  Quay Burow, MD     Referring MD: Caren Macadam, MD    History of Present Illness:    Matthew Barnes is a 66 y.o. male here for the evaluation of hypertension, hyperlipidemia, diabetes last hemoglobin A1c 7.3, with combined systolic and diastolic heart failure discovered on heart catheterization 08/07/2020.  He had moderate pulmonary hypertension secondary.  37 mmHg.  His wedge pressure was 32, LVEDP was 30 mmHg.  He did not have coronary artery disease on cardiac catheterization personally reviewed.  EF was 20 to 25%.  He had severe left ventricular enlargement.  Mild mitral regurgitation.  Creatinine was 1.57 during the hospital.  He had issues with aphasia during that hospitalization but seemed to improve.  Bipolar disorder.  Chronic kidney disease stage IIIa as above.  At the end of the hospitalization he was on atorvastatin 20 mg carvedilol 6.25 mg twice a day digoxin 0.125 mg a day furosemide 40 mg a day losartan 25 mg a day  Past Medical History:  Diagnosis Date   Bipolar II disorder - Managed by Marye Round [phone]) 05/03/2013   Diabetes mellitus    Dysphagia    Hyperlipidemia    Hypertension    Unspecified hypothyroidism 05/03/2013    Past Surgical History:  Procedure Laterality Date   KNEE SURGERY     scope on left knee   RIGHT/LEFT HEART CATH AND CORONARY ANGIOGRAPHY N/A 08/07/2020   Procedure: RIGHT/LEFT HEART CATH AND CORONARY ANGIOGRAPHY;  Surgeon: Leonie Man, MD;  Location: [facility];  Service: Cardiovascular;  Laterality: N/A;    Current Medications: Current Meds  Medication Sig   Accu-Chek Softclix Lancets lancets Check blood sugar 2 times daily   albuterol (VENTOLIN HFA) 108 (90 Base) MCG/ACT inhaler TAKE 2 PUFFS BY MOUTH EVERY 6 HOURS AS NEEDED FOR WHEEZE OR  SHORTNESS OF BREATH   atorvastatin (LIPITOR) 20 MG tablet TAKE 1 TABLET BY MOUTH EVERY DAY   Blood Glucose Monitoring Suppl (ACCU-CHEK GUIDE) w/Device KIT Check blood sugar 2 times daily   Blood Pressure Monitoring (BLOOD PRESSURE CUFF) MISC Use as directed   carvedilol (COREG) 12.5 MG tablet Take 1 tablet (12.5 mg total) by mouth 2 (two) times daily.   digoxin (LANOXIN) 0.125 MG tablet TAKE 1 TABLET BY MOUTH DAILY   empagliflozin (JARDIANCE) 10 MG TABS tablet Take by mouth daily.   fluticasone (FLONASE) 50 MCG/ACT nasal spray Place 1 spray into both nostrils daily.   furosemide (LASIX) 20 MG tablet Take 1 tablet (20 mg total) by mouth daily.   glucose blood (ACCU-CHEK GUIDE) test strip Use to check blood sugar 2 times daily   glucose blood (ONETOUCH VERIO) test strip USE AS INSTRUCTED TO CHECK BLOOD SUGAR 2 TIMES A DAY.   insulin NPH-regular Human (NOVOLIN 70/30) (70-30) 100 UNIT/ML injection Inject 40 Units into the skin 2 (two) times daily with a meal.   Insulin Syringe-Needle U-100 (INSULIN SYRINGE 1CC/31GX5/16") 31G X 5/16" 1 ML MISC Use 3 needles per day   lithium carbonate 300 MG capsule Take 3 capsules (900 mg total) by mouth daily. (Patient taking differently: Take 1,200 mg by mouth daily.)   losartan (COZAAR) 50 MG tablet Take 1 tablet (50 mg total) by mouth daily.   QUEtiapine (SEROQUEL) 200 MG tablet Take 550 mg by  mouth at bedtime.   Semaglutide (OZEMPIC, 1 MG/DOSE, [place]) Inject into the skin.   sildenafil (VIAGRA) 50 MG tablet Take 1 tablet (50 mg total) by mouth daily as needed for erectile dysfunction. Do not take more then once in 24 hours.   [DISCONTINUED] carvedilol (COREG) 6.25 MG tablet TAKE 1 TABLET BY MOUTH 2 TIMES DAILY WITH A MEAL.     Allergies:   Influenza vaccines   Social History   Socioeconomic History   Marital status: Single    Spouse name: Not on file   Number of children: Not on file   Years of education: Not on file   Highest education level: Not on file   Occupational History   Not on file  Tobacco Use   Smoking status: Never   Smokeless tobacco: Never  Vaping Use   Vaping Use: Never used  Substance and Sexual Activity   Alcohol use: No   Drug use: No   Sexual activity: Not Currently  Other Topics Concern   Not on file  Social History Narrative   Work or School: on disability for knee arthritis      Home Situation: lives alone      Spiritual Beliefs:Christian      Lifestyle:no regular exercising; diet not great            Social Determinants of Health   Financial Resource Strain: Low Risk    Difficulty of Paying Living Expenses: Not hard at all  Food Insecurity: No Food Insecurity   Worried About Charity fundraiser in the Last Year: Never true   Arboriculturist in the Last Year: Never true  Transportation Needs: No Transportation Needs   Lack of Transportation (Medical): No   Lack of Transportation (Non-Medical): No  Physical Activity: Inactive   Days of Exercise per Week: 0 days   Minutes of Exercise per Session: 0 min  Stress: No Stress Concern Present   Feeling of Stress : Not at all  Social Connections: Not on file     Family History: The patient's family history includes Depression in his father, paternal grandmother, and another family member; Diabetes in his father, mother, and sister; Heart attack (age of onset: 55) in his father; Heart disease in his father and mother; Hypertension in his father and mother.  ROS:   Please see the history of present illness.    Positive for shortness of breath with activity, no chest pain no syncope no palpitations all other systems reviewed and are negative.  EKGs/Labs/Other Studies Reviewed:    The following studies were reviewed today: Echocardiogram 08/01/2020-EF 20 to 25%, no LV thrombus with Definity, mild MR  Cardiac catheterization 08/07/2020-no CAD, moderate pulmonary hypertension secondary 37 mmHg, LVEDP 30 mmHg  EKG:  EKG is  ordered today.  The ekg ordered  today demonstrates sinus rhythm 87 no other significant changes.  Recent Labs: 08/05/2020: Magnesium 2.2 10/02/2020: B Natriuretic Peptide 379.0 02/28/2021: ALT 18; Hemoglobin 12.7; Platelets 116.0; Pro B Natriuretic peptide (BNP) 93.0; TSH 1.28 04/09/2021: BUN 17; Creatinine, Ser 1.75; Potassium 4.4; Sodium 140  Recent Lipid Panel    Component Value Date/Time   CHOL 116 07/31/2020 0409   TRIG 65 07/31/2020 0409   HDL 24 (L) 07/31/2020 0409   CHOLHDL 4.8 07/31/2020 0409   VLDL 13 07/31/2020 0409   LDLCALC 79 07/31/2020 0409   LDLDIRECT 73.0 09/01/2017 1054     Risk Assessment/Calculations:  Physical Exam:    VS:  BP 120/80 (BP Location: Left Arm, Patient Position: Sitting, Cuff Size: Normal)    Pulse 87    Ht '6\' 1"'  (1.854 m)    Wt (!) 303 lb (137.4 kg)    SpO2 96%    BMI 39.98 kg/m     Wt Readings from Last 3 Encounters:  04/12/21 (!) 303 lb (137.4 kg)  02/28/21 295 lb (133.8 kg)  02/25/21 290 lb (131.5 kg)     GEN:  Well nourished, well developed in no acute distress HEENT: Normal NECK: No JVD; No carotid bruits LYMPHATICS: No lymphadenopathy CARDIAC: RRR, no murmurs, no rubs, gallops RESPIRATORY:  Clear to auscultation without rales, wheezing or rhonchi  ABDOMEN: Protuberant soft, non-tender, non-distended MUSCULOSKELETAL:  No edema; No deformity  SKIN: Warm and dry NEUROLOGIC:  Alert and oriented x 3 PSYCHIATRIC:  Normal affect   ASSESSMENT:    1. Chronic systolic heart failure ([place])   2. Chronic combined systolic and diastolic heart failure ([place])   3. Morbid obesity ([place])   4. Other secondary pulmonary hypertension ([place])   5. Bipolar affective, mixed ([place])   6. Chronic kidney disease, stage 3b (HCC)    PLAN:    In order of problems listed above:  Chronic combined systolic and diastolic heart failure (HCC) Increase carvedilol to 12.5 mg twice a day.  Continue with Jardiance 10 mg a day.  Losartan 50 mg a day.  Unable to utilize [place] because  of creatinine of 1.7.  On digoxin 0.125 mg.  Low-dose.  Unable to utilize spironolactone because of elevated creatinine as well.  Continue with furosemide 40.  Salt and fluid restrictions discussed.  Daily exercise.  Weight loss.  Currently NYHA class II-III  Repeating echocardiogram.  If EF still remains low, 20 to 25% or less than 35%, we will refer to EP for evaluation of ICD.  His QRS duration is 106 ms.  Close follow-up in 3 months.  APP.  Morbid obesity ([place]) Continue to discuss weight loss.  Decrease carbohydrates.  Other secondary pulmonary hypertension ([place]) As a result of cardiomyopathy.  Bipolar affective, mixed ([place]) Robyn Bridges follows him.  Chronic kidney disease, stage 3b (HCC) On losartan.  Dr. Dwyane Dee following as well.        Medication Adjustments/Labs and Tests Ordered: Current medicines are reviewed at length with the patient today.  Concerns regarding medicines are outlined above.  Orders Placed This Encounter  Procedures   EKG 12-Lead   ECHOCARDIOGRAM COMPLETE   Meds ordered this encounter  Medications   carvedilol (COREG) 12.5 MG tablet    Sig: Take 1 tablet (12.5 mg total) by mouth 2 (two) times daily.    Dispense:  180 tablet    Refill:  3    Patient Instructions  Medication Instructions:  Please increase your Carvedilol to 12.5 mg one tablet twice a day. Continue all other medications as listed.  *If you need a refill on your cardiac medications before your next appointment, please call your pharmacy*  Testing/Procedures: Your physician has requested that you have an echocardiogram. Echocardiography is a painless test that uses sound waves to create images of your heart. It provides your doctor with information about the size and shape of your heart and how well your hearts chambers and valves are working. This procedure takes approximately one hour. There are no restrictions for this procedure.  Follow-Up: At [facility], you and your  health needs are our priority.  As part of  our continuing mission to provide you with exceptional heart care, we have created designated Provider Care Teams.  These Care Teams include your primary Cardiologist (physician) and Advanced Practice Providers (APPs -  Physician Assistants and Nurse Practitioners) who all work together to provide you with the care you need, when you need it.  We recommend signing up for the patient portal called "MyChart".  Sign up information is provided on this After Visit Summary.  MyChart is used to connect with patients for Virtual Visits (Telemedicine).  Patients are able to view lab/test results, encounter notes, upcoming appointments, etc.  Non-urgent messages can be sent to your provider as well.   To learn more about what you can do with MyChart, go to [email or web address].    Your next appointment:   3 month(s)  The format for your next appointment:   In Person  Provider:   Melina Copa, PA-C, Ermalinda Barrios, PA-C, Christen Bame, NP, or Richardson Dopp, PA-C.   If MD is not listed, click here to update    :1}    Thank you for choosing [facility]!!      Signed, Candee Furbish, MD  04/12/2021 10:04 AM    [place]

## 2021-04-12 NOTE — Assessment & Plan Note (Signed)
As a result of cardiomyopathy.

## 2021-04-12 NOTE — Patient Instructions (Signed)
Medication Instructions:  Please increase your Carvedilol to 12.5 mg one tablet twice a day. Continue all other medications as listed.  *If you need a refill on your cardiac medications before your next appointment, please call your pharmacy*  Testing/Procedures: Your physician has requested that you have an echocardiogram. Echocardiography is a painless test that uses sound waves to create images of your heart. It provides your doctor with information about the size and shape of your heart and how well your hearts chambers and valves are working. This procedure takes approximately one hour. There are no restrictions for this procedure.  Follow-Up: At [facility], you and your health needs are our priority.  As part of our continuing mission to provide you with exceptional heart care, we have created designated Provider Care Teams.  These Care Teams include your primary Cardiologist (physician) and Advanced Practice Providers (APPs -  Physician Assistants and Nurse Practitioners) who all work together to provide you with the care you need, when you need it.  We recommend signing up for the patient portal called "MyChart".  Sign up information is provided on this After Visit Summary.  MyChart is used to connect with patients for Virtual Visits (Telemedicine).  Patients are able to view lab/test results, encounter notes, upcoming appointments, etc.  Non-urgent messages can be sent to your provider as well.   To learn more about what you can do with MyChart, go to [email or web address].    Your next appointment:   3 month(s)  The format for your next appointment:   In Person  Provider:   Melina Copa, PA-C, Ermalinda Barrios, PA-C, Christen Bame, NP, or Richardson Dopp, PA-C.   If MD is not listed, click here to update    :1}    Thank you for choosing [facility]!!

## 2021-04-19 ENCOUNTER — Encounter: Payer: Self-pay | Admitting: Endocrinology

## 2021-04-19 ENCOUNTER — Ambulatory Visit (INDEPENDENT_AMBULATORY_CARE_PROVIDER_SITE_OTHER): Payer: Medicare HMO | Admitting: Endocrinology

## 2021-04-19 ENCOUNTER — Other Ambulatory Visit: Payer: Self-pay

## 2021-04-19 VITALS — BP 154/88 | HR 84 | Ht 73.0 in | Wt 308.8 lb

## 2021-04-19 DIAGNOSIS — N289 Disorder of kidney and ureter, unspecified: Secondary | ICD-10-CM | POA: Diagnosis not present

## 2021-04-19 DIAGNOSIS — Z794 Long term (current) use of insulin: Secondary | ICD-10-CM | POA: Diagnosis not present

## 2021-04-19 DIAGNOSIS — E1165 Type 2 diabetes mellitus with hyperglycemia: Secondary | ICD-10-CM

## 2021-04-19 NOTE — Patient Instructions (Addendum)
Check blood sugars on waking up 3 -4 days a week  Also check blood sugars about 2 hours after meals and do this after different meals by rotation  Recommended blood sugar levels on waking up are 90-130 and about 2 hours after meal is 130-180  Please bring your blood sugar monitor to each visit, thank you  Start OZEMPIC injections by dialing 0.25 mg on the pen once weekly on the same day of the week.  You will feel fullness of the stomach with starting the medication and should try to keep the portions at meals small.  You may experience nausea in the first few days which usually gets better over time    After 2 weeks increase the dose to 0.5 mg weekly  Stop jardiance

## 2021-04-19 NOTE — Progress Notes (Signed)
Patient ID: Matthew Barnes, male   DOB: 22-Mar-1956, 66 y.o.   MRN: [id number]    Reason for Appointment: Followup for Type 2 Diabetes    History of Present Illness:          Diagnosis: Type 2 diabetes mellitus, date of diagnosis:   2011       Past history:  He was started on metformin at diagnosis and apparently did have fairly good control initially However about a year later because of poor control he was given insulin in addition. He had been taking Lantus insulin until about 2/15, up to 80 units a day However he does not think his blood sugars  Were controlled with this Because of higher A1c of 13.1% he was referred here for diabetes management in 6/15; was started on glucose monitoring and insulin was changed from low dose Levemir to Humalog mix 70 units a day He was also started on Victoza  to help with blood sugar control and facilitate weight loss on his initial consultation  Recent history:   INSULIN regimen is described as: Novolin mix 70/30 with syringe, 50 units at breakfast, 40 units in p.m  Oral hypoglycemic drugs the patient is taking are: Jardiance 10 mg daily   A1c is slightly higher at 7.5 compared to 7.3; has been as low as 5.8 and in 5/22 was 8.3  Fructosamine is previously at 331   Current blood sugar patterns, management of diabetes and problems: He was asked to start using new test strips on his last visit He was given a sample of Ozempic and told to come back in 6 weeks but he did not He thinks his sugars may have been better when he was taking Ozempic but does not remember  Also he thinks that he was taking his Ozempic every 3 or 4 days instead of once a week  He has been previously reluctant to take brand-name medications because of cost  Even though he was supposed to stop his Jardiance he still takes it without much benefit He also still complains of increased urination with this especially with combined with Lasix  Most of his blood sugar readings are  fasting and except for a reading of 94 they are all relatively higher He thinks his blood sugars are higher in the morning when he is going off his diet and getting more carbohydrates at night No hypoglycemic symptoms  Not exercising, complaining about more knee pain   Side effects from medications have been: None  Glucose monitoring:  done about 1 times a day or less       Glucometer:  Verio   Blood Glucose readings  FASTING range 94-226 AVERAGE for 30 days = 174  PREVIOUSLY:  PRE-MEAL Fasting Lunch Dinner Bedtime Overall  Glucose range: 108-242  112    Mean/median:     ?   POST-MEAL PC Breakfast PC Lunch PC Dinner  Glucose range:   220-310  Mean/median:      Glycemic control:   Lab Results  Component Value Date   HGBA1C 7.5 (H) 04/09/2021   HGBA1C 7.3 (A) 11/01/2020   HGBA1C 8.3 (H) 07/31/2020   Lab Results  Component Value Date   MICROALBUR 1.9 02/28/2021   LDLCALC 79 07/31/2020   CREATININE 1.75 (H) 04/09/2021   Lab Results  Component Value Date   FRUCTOSAMINE 331 (H) 12/11/2020   FRUCTOSAMINE 310 (H) 05/21/2020   FRUCTOSAMINE 275 12/13/2019    Self-care: The diet that the patient has been  following is: None, unable to control portions and carbs  when depressed  Meals: 3 meals per day (dinner 6 pm-10 pm)        Dietician visit: Most recent: never      CDE visit: 08/2013             Weight history: baseline 295  Wt Readings from Last 3 Encounters:  04/19/21 (!) 308 lb 12.8 oz (140.1 kg)  04/12/21 (!) 303 lb (137.4 kg)  02/28/21 295 lb (133.8 kg)   No visits with results within 1 Week(s) from this visit.  Latest known visit with results is:  Lab on 04/09/2021  Component Date Value Ref Range Status   Sodium 04/09/2021 140  135 - 145 mEq/L Final   Potassium 04/09/2021 4.4  3.5 - 5.1 mEq/L Final   Chloride 04/09/2021 106  96 - 112 mEq/L Final   CO2 04/09/2021 31  19 - 32 mEq/L Final   Glucose, Bld 04/09/2021 69 (L)  70 - 99 mg/dL Final   BUN  04/09/2021 17  6 - 23 mg/dL Final   Creatinine, Ser 04/09/2021 1.75 (H)  0.40 - 1.50 mg/dL Final   GFR 04/09/2021 40.47 (L)  >60.00 mL/min Final   Calculated using the CKD-EPI Creatinine Equation (2021)   Calcium 04/09/2021 9.6  8.4 - 10.5 mg/dL Final   Hgb A1c MFr Bld 04/09/2021 7.5 (H)  4.6 - 6.5 % Final   Glycemic Control Guidelines for People with Diabetes:Non Diabetic:  <6%Goal of Therapy: <7%Additional Action Suggested:  >8%       Allergies as of 04/19/2021       Reactions   Influenza Vaccines Swelling, Other (See Comments)   Reports of swelling of the arm and then was "sick" for 10 days        Medication List        Accurate as of April 19, 2021 11:59 PM. If you have any questions, ask your nurse or doctor.          Accu-Chek Guide w/Device Kit Check blood sugar 2 times daily   Accu-Chek Softclix Lancets lancets Check blood sugar 2 times daily   albuterol 108 (90 Base) MCG/ACT inhaler Commonly known as: VENTOLIN HFA TAKE 2 PUFFS BY MOUTH EVERY 6 HOURS AS NEEDED FOR WHEEZE OR SHORTNESS OF BREATH   atorvastatin 20 MG tablet Commonly known as: LIPITOR TAKE 1 TABLET BY MOUTH EVERY DAY   Blood Pressure Cuff Misc Use as directed   carvedilol 12.5 MG tablet Commonly known as: COREG Take 1 tablet (12.5 mg total) by mouth 2 (two) times daily.   digoxin 0.125 MG tablet Commonly known as: LANOXIN TAKE 1 TABLET BY MOUTH DAILY   empagliflozin 10 MG Tabs tablet Commonly known as: JARDIANCE Take by mouth daily.   fluticasone 50 MCG/ACT nasal spray Commonly known as: FLONASE Place 1 spray into both nostrils daily.   furosemide 20 MG tablet Commonly known as: LASIX Take 1 tablet (20 mg total) by mouth daily.   INSULIN SYRINGE 1CC/31GX5/16" 31G X 5/16" 1 ML Misc Use 3 needles per day   lithium carbonate 300 MG capsule Take 3 capsules (900 mg total) by mouth daily. What changed: how much to take   losartan 50 MG tablet Commonly known as: COZAAR Take 1  tablet (50 mg total) by mouth daily.   NovoLIN 70/30 (70-30) 100 UNIT/ML injection Generic drug: insulin NPH-regular Human Inject 40 Units into the skin 2 (two) times daily with a meal. What changed: additional  instructions   OneTouch Verio test strip Generic drug: glucose blood USE AS INSTRUCTED TO CHECK BLOOD SUGAR 2 TIMES A DAY.   Accu-Chek Guide test strip Generic drug: glucose blood Use to check blood sugar 2 times daily   OZEMPIC (1 MG/DOSE) [place] Inject into the skin.   QUEtiapine 200 MG tablet Commonly known as: SEROQUEL Take 550 mg by mouth at bedtime.   sildenafil 50 MG tablet Commonly known as: Viagra Take 1 tablet (50 mg total) by mouth daily as needed for erectile dysfunction. Do not take more then once in 24 hours.        Allergies:  Allergies  Allergen Reactions   Influenza Vaccines Swelling and Other (See Comments)    Reports of swelling of the arm and then was "sick" for 10 days    Past Medical History:  Diagnosis Date   Bipolar II disorder - Managed by Marye Round [phone]) 05/03/2013   Diabetes mellitus    Dysphagia    Hyperlipidemia    Hypertension    Unspecified hypothyroidism 05/03/2013    Past Surgical History:  Procedure Laterality Date   KNEE SURGERY     scope on left knee   RIGHT/LEFT HEART CATH AND CORONARY ANGIOGRAPHY N/A 08/07/2020   Procedure: RIGHT/LEFT HEART CATH AND CORONARY ANGIOGRAPHY;  Surgeon: Leonie Man, MD;  Location: [facility];  Service: Cardiovascular;  Laterality: N/A;    Family History  Problem Relation Age of Onset   Diabetes Mother    Hypertension Mother    Heart disease Mother    Heart disease Father    Heart attack Father 81   Diabetes Father    Hypertension Father    Depression Father    Diabetes Sister    Depression Paternal Grandmother    Depression Other     Social History:  reports that he has never smoked. He has never used smokeless tobacco. He reports that he does not drink  alcohol and does not use drugs.    Review of Systems   HYPERTENSION:  He is taking losartan 25 mg and carvedilol 6.25 mg from his cardiologist Blood pressure readings as follows:  BP Readings from Last 3 Encounters:  04/19/21 (!) 154/88  04/12/21 120/80  02/28/21 124/76   Renal dysfunction: This appears worse now   No previous history of microalbuminuria  Lab Results  Component Value Date   CREATININE 1.75 (H) 04/09/2021   CREATININE 1.42 02/28/2021   CREATININE 1.42 02/28/2021   HEART failure: He has had CHF and was in the emergency room for treatment in July Currently on Lasix and Lanoxin along with Coreg and Jardiance       LIPIDS: He has had marked increase in triglycerides previously, now improved.  Previously on 20 mg lovastatin, now on 20 mg Lipitor  Did not have any coronary artery disease  He is also followed by PCP        Lab Results  Component Value Date   CHOL 116 07/31/2020   HDL 24 (L) 07/31/2020   LDLCALC 79 07/31/2020   LDLDIRECT 73.0 09/01/2017   TRIG 65 07/31/2020   CHOLHDL 4.8 07/31/2020       Thyroid:  Previously had mild hypothyroidism for a few years TSH subsequently has been normal consistently without taking any thyroid supplements  Has been on lithium long-term  Lab Results  Component Value Date   TSH 1.28 02/28/2021        He has had some numbness in his feet,  more on the right side, using gabapentin only as needed May use elastic stockings also with some relief  Findings on last exam:   Patchy decrease in sensation in the toes especially right and decreased on the plantar surface also   LABS:  No visits with results within 1 Week(s) from this visit.  Latest known visit with results is:  Lab on 04/09/2021  Component Date Value Ref Range Status   Sodium 04/09/2021 140  135 - 145 mEq/L Final   Potassium 04/09/2021 4.4  3.5 - 5.1 mEq/L Final   Chloride 04/09/2021 106  96 - 112 mEq/L Final   CO2 04/09/2021 31  19 - 32 mEq/L  Final   Glucose, Bld 04/09/2021 69 (L)  70 - 99 mg/dL Final   BUN 04/09/2021 17  6 - 23 mg/dL Final   Creatinine, Ser 04/09/2021 1.75 (H)  0.40 - 1.50 mg/dL Final   GFR 04/09/2021 40.47 (L)  >60.00 mL/min Final   Calculated using the CKD-EPI Creatinine Equation (2021)   Calcium 04/09/2021 9.6  8.4 - 10.5 mg/dL Final   Hgb A1c MFr Bld 04/09/2021 7.5 (H)  4.6 - 6.5 % Final   Glycemic Control Guidelines for People with Diabetes:Non Diabetic:  <6%Goal of Therapy: <7%Additional Action Suggested:  >8%     Physical Examination:  BP (!) 154/88    Pulse 84    Ht '6\' 1"'  (1.854 m)    Wt (!) 308 lb 12.8 oz (140.1 kg)    SpO2 97%    BMI 40.74 kg/m      ASSESSMENT/PLAN:   Diabetes type 2, on insulin  See history of present illness for detailed discussion of his current management, blood sugar patterns and problems identified  A1c is higher consistently and now 7.5  He is on premixed insulin twice daily along with Jardiance He did not let us know he had a lot of his sample of Ozempic which may have been helping He has not had any improvement in blood sugars with Jardiance, no weight loss and concerned about likely polyuria from this He has not been checking his blood sugars after meals as directed and difficult to adjust his insulin Dietary compliance is variable depending on his stress eating Not exercising  Recommendations: He can stop Jardiance as discussed before also [facility], likely will need progressively higher doses However not clear if he can afford this at this time but will send prescription As before he will start with 0.25 mg weekly but after 2 weeks can try 0.5 To let us know when his prescription runs out and will have him go up to 1 mg if tolerated This is not benefiting him and he is complaining of polyuria  His insulin dose may need to be reduced if Ozempic brings his blood sugars down especially overnight Although he may benefit from water aerobics he likely to do  this Discussed needing to check readings consistently after meals to help adjust his doses and identify foods that are making his sugars higher He was explained the use of the freestyle libre system and benefits compared to just checking blood sugar once a day but he is reluctant to do this now and apply an object on his arm   RENAL dysfunction: This appears to be worse recently May be from combination of taking Jardiance and Lasix, otherwise etiology not clear as he is not on any nonsteroidal drugs He has been on lithium for quite some time previously  Although this may improve  with stopping Jardiance we will need to have him follow-up with his PCP or cardiologist, also needs follow-up of his blood pressure  Patient Instructions  Check blood sugars on waking up 3 -4 days a week  Also check blood sugars about 2 hours after meals and do this after different meals by rotation  Recommended blood sugar levels on waking up are 90-130 and about 2 hours after meal is 130-180  Please bring your blood sugar monitor to each visit, thank you  Start OZEMPIC injections by dialing 0.25 mg on the pen once weekly on the same day of the week.  You will feel fullness of the stomach with starting the medication and should try to keep the portions at meals small.  You may experience nausea in the first few days which usually gets better over time    After 2 weeks increase the dose to 0.5 mg weekly  Stop jardiance        Elayne Snare 04/21/2021, 10:53 AM   Note: This office note was prepared with Dragon voice recognition system technology. Any transcriptional errors that result from this process are unintentional.

## 2021-04-20 ENCOUNTER — Other Ambulatory Visit: Payer: Self-pay | Admitting: Family Medicine

## 2021-04-21 MED ORDER — OZEMPIC (0.25 OR 0.5 MG/DOSE) 2 MG/1.5ML ~~LOC~~ SOPN: 0.5000 mg | PEN_INJECTOR | SUBCUTANEOUS | 2 refills | Status: DC

## 2021-04-23 ENCOUNTER — Telehealth: Payer: Self-pay | Admitting: *Deleted

## 2021-04-23 DIAGNOSIS — R7989 Other specified abnormal findings of blood chemistry: Secondary | ICD-10-CM

## 2021-04-23 NOTE — Telephone Encounter (Signed)
Left a message for the patient to return my call.  

## 2021-04-23 NOTE — Telephone Encounter (Signed)
-----   Message from Caren Macadam, MD sent at 04/20/2021  9:50 AM EST ----- Please order bmp to be done in about 2 weeks for dx of elevated serum creat ----- Message ----- From: Elayne Snare, MD Sent: 04/19/2021  11:21 AM EST To: Jerline Pain, MD, Caren Macadam, MD  Stopping Jardiance, please f/u renal

## 2021-04-24 DIAGNOSIS — F3132 Bipolar disorder, current episode depressed, moderate: Secondary | ICD-10-CM | POA: Diagnosis not present

## 2021-04-24 NOTE — Telephone Encounter (Signed)
Spoke with the patient and scheduled a lab appt for 2/8.

## 2021-04-26 ENCOUNTER — Other Ambulatory Visit: Payer: Self-pay

## 2021-04-26 ENCOUNTER — Ambulatory Visit (HOSPITAL_COMMUNITY): Payer: Medicare HMO | Attending: Cardiology

## 2021-04-26 ENCOUNTER — Other Ambulatory Visit: Payer: Self-pay | Admitting: *Deleted

## 2021-04-26 DIAGNOSIS — I5022 Chronic systolic (congestive) heart failure: Secondary | ICD-10-CM

## 2021-04-26 DIAGNOSIS — R931 Abnormal findings on diagnostic imaging of heart and coronary circulation: Secondary | ICD-10-CM

## 2021-04-26 LAB — ECHOCARDIOGRAM COMPLETE
Area-P 1/2: 5.38 cm2
S' Lateral: 6.5 cm

## 2021-04-26 MED ORDER — PERFLUTREN LIPID MICROSPHERE
1.0000 mL | INTRAVENOUS | Status: AC | PRN
  Administered 2021-04-26: 1 mL via INTRAVENOUS

## 2021-04-30 ENCOUNTER — Telehealth: Payer: Self-pay

## 2021-04-30 ENCOUNTER — Other Ambulatory Visit: Payer: Self-pay | Admitting: Family Medicine

## 2021-04-30 DIAGNOSIS — R1310 Dysphagia, unspecified: Secondary | ICD-10-CM

## 2021-04-30 NOTE — Telephone Encounter (Signed)
-----   Message from Jerline Pain, MD sent at 04/30/2021  5:48 AM EST ----- EF remains low at 25% Refer to EP to consider ICD Candee Furbish, MD

## 2021-05-05 ENCOUNTER — Other Ambulatory Visit: Payer: Self-pay | Admitting: Family Medicine

## 2021-05-05 DIAGNOSIS — I5023 Acute on chronic systolic (congestive) heart failure: Secondary | ICD-10-CM

## 2021-05-08 ENCOUNTER — Other Ambulatory Visit: Payer: Medicare HMO

## 2021-05-14 ENCOUNTER — Encounter: Payer: Self-pay | Admitting: Internal Medicine

## 2021-05-14 ENCOUNTER — Ambulatory Visit: Payer: Medicare HMO | Admitting: Internal Medicine

## 2021-05-14 ENCOUNTER — Other Ambulatory Visit: Payer: Self-pay

## 2021-05-14 VITALS — BP 146/72 | HR 94 | Ht 73.0 in | Wt 308.0 lb

## 2021-05-14 DIAGNOSIS — I5022 Chronic systolic (congestive) heart failure: Secondary | ICD-10-CM | POA: Diagnosis not present

## 2021-05-14 NOTE — Patient Instructions (Addendum)
Implantable Device Instructions   You are scheduled for: ICD Implant on 05/20/2021 with Dr. Lovena Le.   1.   Pre procedure testing-             A.    Labs- TODAY: BMET, CBC w/Diff     2.  PROCEDURE DAY: -On the day of your procedure 05/20/2021 at 9:30am--you will go to [facility] [phone] [address]  You will go to the main entrance A [employer]) and enter where the [employer] parking staff are.  You will check in at ADMITTING.  You may have one support person come in to the hospital with you.  They will be asked to wait in the waiting room.  It is OK to have someone drop you off and come back when you are ready to be discharged.     -    No eating or drinking after midnight prior to procedure.     -   Medication instructions:    The night before your procedure only take 1/2 dose of Insulin...             On the morning of your procedure hold ALL medications (pt states he takes everything at night)   - The night before your procedure and the morning of your procedure scrub your neck/chest with CHG surgical scrub.  See instruction letter.  -  Plan for an overnight stay but you may be discharged after your procedure.  If you are discharged after your procedure you will need someone to drive you home and be with you for 24 hours after your procedure.   -  You will follow up with the [place] clinic 10-14 days after your procedure. - You will follow up with Dr. Lovena Le 91 days after your procedure.  These appointments will be made for you.   * If you have ANY questions after you get home, please call the office at [phone] or send a MyChart message.  ----------------------------------------------------------------------------------------------------- [facility] - Preparing For Surgery  Before surgery, you can play an important role. Because skin is not sterile, your skin needs to be as free of germs as possible. You can  reduce the number of germs on your skin by washing with CHG (chlorahexidine gluconate) Soap before surgery.  CHG is an antiseptic cleaner which kills germs and bonds with the skin to continue killing germs even after washing.   Please do not use if you have an allergy to CHG or antibacterial soaps.  If your skin becomes reddened/irritated stop using the CHG.   Do not shave (including legs and underarms) for at least 48 hours prior to first CHG shower.  It is OK to shave your face.  Please follow these instructions carefully:  1.  Shower the night before surgery and the morning of surgery with CHG.  2.  If you choose to wash your hair, wash your hair first as usual with your normal shampoo.  3.  After you shampoo, rinse your hair and body thoroughly to remove the shampoo.  4.  Use CHG as you would any other liquid soap.  You can apply CHG directly to the skin and wash gently with a clean washcloth. 5.  Apply the CHG Soap to your body ONLY FROM THE NECK DOWN.  Do not use on open wounds or open sores.  Avoid contact with your eyes, ears, mouth and genitals (private parts).  Wash genitals (private parts) with your normal soap.  6.  Wash thoroughly, paying special attention to the area where your surgery will be performed.  7.  Thoroughly rinse your body with warm water from the neck down.   8.  DO NOT shower/wash with your normal soap after using and rinsing off the CHG soap.  9.  Pat yourself dry with a clean towel.           10.  Wear clean pajamas.           11.  Place clean sheets on your bed the night of your first shower and do not sleep with pets.  Day of Surgery: Do not apply any deodorants/lotions.  Please wear clean clothes to the hospital/surgery center.

## 2021-05-14 NOTE — H&P (View-Only) (Signed)
HPI [name] is referred by Dr. Marlou Porch for consideration for ICD insertion. He is a pleasant 66 yo man with a h/o HTN and non-ischemic CM, EF 20%. He has class 2 CHF symptoms. He has not had syncope. He has been on maximal guideline directed medical therapy for over a year. He has a h/o bipolar disorder which has been nicely controlled. Since hospitalization his peripheral edema has been controlled.  Allergies  Allergen Reactions   Influenza Vaccines Swelling and Other (See Comments)    Reports of swelling of the arm and then was "sick" for 10 days     Current Outpatient Medications  Medication Sig Dispense Refill   Accu-Chek Softclix Lancets lancets Check blood sugar 2 times daily 100 each 3   atorvastatin (LIPITOR) 20 MG tablet TAKE 1 TABLET BY MOUTH EVERY DAY 90 tablet 1   Blood Glucose Monitoring Suppl (ACCU-CHEK GUIDE) w/Device KIT Check blood sugar 2 times daily 1 kit 0   Blood Pressure Monitoring (BLOOD PRESSURE CUFF) MISC Use as directed 1 each 0   carvedilol (COREG) 12.5 MG tablet Take 1 tablet (12.5 mg total) by mouth 2 (two) times daily. (Patient taking differently: Take 12.5 mg by mouth 2 (two) times daily. Taking 6.25 mg tablets twice a day) 180 tablet 3   digoxin (LANOXIN) 0.125 MG tablet TAKE 1 TABLET BY MOUTH DAILY 90 tablet 1   fluticasone (FLONASE) 50 MCG/ACT nasal spray Place 1 spray into both nostrils daily. 16 g 0   furosemide (LASIX) 20 MG tablet Take 1 tablet (20 mg total) by mouth daily. 90 tablet 1   glucose blood (ACCU-CHEK GUIDE) test strip Use to check blood sugar 2 times daily 100 each 3   glucose blood (ONETOUCH VERIO) test strip USE AS INSTRUCTED TO CHECK BLOOD SUGAR 2 TIMES A DAY. 200 each 1   insulin NPH-regular Human (NOVOLIN 70/30) (70-30) 100 UNIT/ML injection Inject 40 Units into the skin 2 (two) times daily with a meal. (Patient taking differently: Inject 40 Units into the skin 2 (two) times daily with a meal. 50 units in am 40 in pm) 20 mL 3    Insulin Syringe-Needle U-100 (INSULIN SYRINGE 1CC/31GX5/16") 31G X 5/16" 1 ML MISC Use 3 needles per day 100 each 3   lithium carbonate 300 MG capsule Take 3 capsules (900 mg total) by mouth daily. (Patient taking differently: Take 1,200 mg by mouth daily.)     losartan (COZAAR) 50 MG tablet Take 1 tablet (50 mg total) by mouth daily. 90 tablet 1   pantoprazole (PROTONIX) 20 MG tablet TAKE 1 TABLET BY MOUTH TWICE A DAY BEFORE MEALS 180 tablet 1   QUEtiapine (SEROQUEL) 200 MG tablet Take 550 mg by mouth at bedtime.     Semaglutide,0.25 or 0.5MG/DOS, (OZEMPIC, 0.25 OR 0.5 MG/DOSE,) 2 MG/1.5ML SOPN Inject 0.5 mg into the skin once a week. Start with 0.25 mg weekly for 2 weeks then inject 0.5 mg weekly 1.5 mL 2   sildenafil (VIAGRA) 50 MG tablet Take 1 tablet (50 mg total) by mouth daily as needed for erectile dysfunction. Do not take more then once in 24 hours. 10 tablet 3   albuterol (VENTOLIN HFA) 108 (90 Base) MCG/ACT inhaler TAKE 2 PUFFS BY MOUTH EVERY 6 HOURS AS NEEDED FOR WHEEZE OR SHORTNESS OF BREATH (Patient not taking: Reported on 04/19/2021) 8.5 each 3   No current facility-administered medications for this visit.     Past Medical History:  Diagnosis Date  Bipolar II disorder - Managed by Marye Round [phone]) 05/03/2013   Diabetes mellitus    Dysphagia    Hyperlipidemia    Hypertension    Unspecified hypothyroidism 05/03/2013    ROS:   All systems reviewed and negative except as noted in the HPI.   Past Surgical History:  Procedure Laterality Date   KNEE SURGERY     scope on left knee   RIGHT/LEFT HEART CATH AND CORONARY ANGIOGRAPHY N/A 08/07/2020   Procedure: RIGHT/LEFT HEART CATH AND CORONARY ANGIOGRAPHY;  Surgeon: Leonie Man, MD;  Location: [facility];  Service: Cardiovascular;  Laterality: N/A;     Family History  Problem Relation Age of Onset   Diabetes Mother    Hypertension Mother    Heart disease Mother    Heart disease Father    Heart  attack Father 68   Diabetes Father    Hypertension Father    Depression Father    Diabetes Sister    Depression Paternal Grandmother    Depression Other      Social History   Socioeconomic History   Marital status: Single    Spouse name: Not on file   Number of children: Not on file   Years of education: Not on file   Highest education level: Not on file  Occupational History   Not on file  Tobacco Use   Smoking status: Never   Smokeless tobacco: Never  Vaping Use   Vaping Use: Never used  Substance and Sexual Activity   Alcohol use: No   Drug use: No   Sexual activity: Not Currently  Other Topics Concern   Not on file  Social History Narrative   Work or School: on disability for knee arthritis      Home Situation: lives alone      Spiritual Beliefs:Christian      Lifestyle:no regular exercising; diet not great            Social Determinants of Health   Financial Resource Strain: Low Risk    Difficulty of Paying Living Expenses: Not hard at all  Food Insecurity: No Food Insecurity   Worried About Charity fundraiser in the Last Year: Never true   Arboriculturist in the Last Year: Never true  Transportation Needs: No Transportation Needs   Lack of Transportation (Medical): No   Lack of Transportation (Non-Medical): No  Physical Activity: Inactive   Days of Exercise per Week: 0 days   Minutes of Exercise per Session: 0 min  Stress: No Stress Concern Present   Feeling of Stress : Not at all  Social Connections: Not on file  Intimate Partner Violence: Not on file     BP (!) 146/72    Pulse 94    Ht _0  (1.854 m)    Wt (!) 308 lb (139.7 kg)    SpO2 95%    BMI 40.64 kg/m   Physical Exam:  Well appearing NAD HEENT: Unremarkable Neck:  No JVD, no thyromegally Lymphatics:  No adenopathy Back:  No CVA tenderness Lungs:  Clear with no wheezes HEART:  Regular rate rhythm, no murmurs, no rubs, no clicks Abd:  soft, positive bowel sounds, no  organomegally, no rebound, no guarding Ext:  2 plus pulses, no edema, no cyanosis, no clubbing Skin:  No rashes no nodules Neuro:  CN II through XII intact, motor grossly intact  EKG - nsr  Assess/Plan:  Chronic systolic heart failure - I have discussed  th treatment options with the patient and the risks/benefits/goals/expectations of ICD insertion were reviewed and he wishes to proceed.  Carleene Overlie Shandelle Borrelli,MD

## 2021-05-14 NOTE — Progress Notes (Signed)
HPI [name] is referred by Dr. Marlou Porch for consideration for ICD insertion. He is a pleasant 66 yo man with a h/o HTN and non-ischemic CM, EF 20%. He has class 2 CHF symptoms. He has not had syncope. He has been on maximal guideline directed medical therapy for over a year. He has a h/o bipolar disorder which has been nicely controlled. Since hospitalization his peripheral edema has been controlled.  Allergies  Allergen Reactions   Influenza Vaccines Swelling and Other (See Comments)    Reports of swelling of the arm and then was "sick" for 10 days     Current Outpatient Medications  Medication Sig Dispense Refill   Accu-Chek Softclix Lancets lancets Check blood sugar 2 times daily 100 each 3   atorvastatin (LIPITOR) 20 MG tablet TAKE 1 TABLET BY MOUTH EVERY DAY 90 tablet 1   Blood Glucose Monitoring Suppl (ACCU-CHEK GUIDE) w/Device KIT Check blood sugar 2 times daily 1 kit 0   Blood Pressure Monitoring (BLOOD PRESSURE CUFF) MISC Use as directed 1 each 0   carvedilol (COREG) 12.5 MG tablet Take 1 tablet (12.5 mg total) by mouth 2 (two) times daily. (Patient taking differently: Take 12.5 mg by mouth 2 (two) times daily. Taking 6.25 mg tablets twice a day) 180 tablet 3   digoxin (LANOXIN) 0.125 MG tablet TAKE 1 TABLET BY MOUTH DAILY 90 tablet 1   fluticasone (FLONASE) 50 MCG/ACT nasal spray Place 1 spray into both nostrils daily. 16 g 0   furosemide (LASIX) 20 MG tablet Take 1 tablet (20 mg total) by mouth daily. 90 tablet 1   glucose blood (ACCU-CHEK GUIDE) test strip Use to check blood sugar 2 times daily 100 each 3   glucose blood (ONETOUCH VERIO) test strip USE AS INSTRUCTED TO CHECK BLOOD SUGAR 2 TIMES A DAY. 200 each 1   insulin NPH-regular Human (NOVOLIN 70/30) (70-30) 100 UNIT/ML injection Inject 40 Units into the skin 2 (two) times daily with a meal. (Patient taking differently: Inject 40 Units into the skin 2 (two) times daily with a meal. 50 units in am 40 in pm) 20 mL 3    Insulin Syringe-Needle U-100 (INSULIN SYRINGE 1CC/31GX5/16") 31G X 5/16" 1 ML MISC Use 3 needles per day 100 each 3   lithium carbonate 300 MG capsule Take 3 capsules (900 mg total) by mouth daily. (Patient taking differently: Take 1,200 mg by mouth daily.)     losartan (COZAAR) 50 MG tablet Take 1 tablet (50 mg total) by mouth daily. 90 tablet 1   pantoprazole (PROTONIX) 20 MG tablet TAKE 1 TABLET BY MOUTH TWICE A DAY BEFORE MEALS 180 tablet 1   QUEtiapine (SEROQUEL) 200 MG tablet Take 550 mg by mouth at bedtime.     Semaglutide,0.25 or 0.5MG/DOS, (OZEMPIC, 0.25 OR 0.5 MG/DOSE,) 2 MG/1.5ML SOPN Inject 0.5 mg into the skin once a week. Start with 0.25 mg weekly for 2 weeks then inject 0.5 mg weekly 1.5 mL 2   sildenafil (VIAGRA) 50 MG tablet Take 1 tablet (50 mg total) by mouth daily as needed for erectile dysfunction. Do not take more then once in 24 hours. 10 tablet 3   albuterol (VENTOLIN HFA) 108 (90 Base) MCG/ACT inhaler TAKE 2 PUFFS BY MOUTH EVERY 6 HOURS AS NEEDED FOR WHEEZE OR SHORTNESS OF BREATH (Patient not taking: Reported on 04/19/2021) 8.5 each 3   No current facility-administered medications for this visit.     Past Medical History:  Diagnosis Date  Bipolar II disorder - Managed by Daniellle Adegoroye ([phone]) 05/03/2013  ° Diabetes mellitus   ° Dysphagia   ° Hyperlipidemia   ° Hypertension   ° Unspecified hypothyroidism 05/03/2013  ° ° °ROS: ° ° All systems reviewed and negative except as noted in the HPI. ° ° °Past Surgical History:  °Procedure Laterality Date  ° KNEE SURGERY    ° scope on left knee  ° RIGHT/LEFT HEART CATH AND CORONARY ANGIOGRAPHY N/A 08/07/2020  ° Procedure: RIGHT/LEFT HEART CATH AND CORONARY ANGIOGRAPHY;  Surgeon: Harding, David W, MD;  Location: [facility];  Service: Cardiovascular;  Laterality: N/A;  ° ° ° °Family History  °Problem Relation Age of Onset  ° Diabetes Mother   ° Hypertension Mother   ° Heart disease Mother   ° Heart disease Father   ° Heart  attack Father 40  ° Diabetes Father   ° Hypertension Father   ° Depression Father   ° Diabetes Sister   ° Depression Paternal Grandmother   ° Depression Other   ° ° ° °Social History  ° °Socioeconomic History  ° Marital status: Single  °  Spouse name: Not on file  ° Number of children: Not on file  ° Years of education: Not on file  ° Highest education level: Not on file  °Occupational History  ° Not on file  °Tobacco Use  ° Smoking status: Never  ° Smokeless tobacco: Never  °Vaping Use  ° Vaping Use: Never used  °Substance and Sexual Activity  ° Alcohol use: No  ° Drug use: No  ° Sexual activity: Not Currently  °Other Topics Concern  ° Not on file  °Social History Narrative  ° Work or School: on disability for knee arthritis  °   ° Home Situation: lives alone  °   ° Spiritual Beliefs:Christian  °   ° Lifestyle:no regular exercising; diet not great  °   °   °   ° °Social Determinants of Health  ° °Financial Resource Strain: Low Risk   ° Difficulty of Paying Living Expenses: Not hard at all  °Food Insecurity: No Food Insecurity  ° Worried About Running Out of Food in the Last Year: Never true  ° Ran Out of Food in the Last Year: Never true  °Transportation Needs: No Transportation Needs  ° Lack of Transportation (Medical): No  ° Lack of Transportation (Non-Medical): No  °Physical Activity: Inactive  ° Days of Exercise per Week: 0 days  ° Minutes of Exercise per Session: 0 min  °Stress: No Stress Concern Present  ° Feeling of Stress : Not at all  °Social Connections: Not on file  °Intimate Partner Violence: Not on file  ° ° ° °BP (!) 146/72    Pulse 94    Ht 6' 1" (1.854 m)    Wt (!) 308 lb (139.7 kg)    SpO2 95%    BMI 40.64 kg/m²  ° °Physical Exam: ° °Well appearing NAD °HEENT: Unremarkable °Neck:  No JVD, no thyromegally °Lymphatics:  No adenopathy °Back:  No CVA tenderness °Lungs:  Clear with no wheezes °HEART:  Regular rate rhythm, no murmurs, no rubs, no clicks °Abd:  soft, positive bowel sounds, no  organomegally, no rebound, no guarding °Ext:  2 plus pulses, no edema, no cyanosis, no clubbing °Skin:  No rashes no nodules °Neuro:  CN II through XII intact, motor grossly intact ° °EKG - nsr ° °Assess/Plan:  °Chronic systolic heart failure - I have discussed   th treatment options with the patient and the risks/benefits/goals/expectations of ICD insertion were reviewed and he wishes to proceed.  Carleene Overlie Cheral Cappucci,MD

## 2021-05-15 ENCOUNTER — Other Ambulatory Visit: Payer: Medicare HMO

## 2021-05-15 ENCOUNTER — Encounter: Payer: Self-pay | Admitting: Family Medicine

## 2021-05-15 LAB — CBC
Hematocrit: 40.7 % (ref 37.5–51.0)
Hemoglobin: 13.1 g/dL (ref 13.0–17.7)
MCH: 25.7 pg — ABNORMAL LOW (ref 26.6–33.0)
MCHC: 32.2 g/dL (ref 31.5–35.7)
MCV: 80 fL (ref 79–97)
Platelets: 133 10*3/uL — ABNORMAL LOW (ref 150–450)
RBC: 5.1 x10E6/uL (ref 4.14–5.80)
RDW: 14.7 % (ref 11.6–15.4)
WBC: 6.2 10*3/uL (ref 3.4–10.8)

## 2021-05-15 LAB — BASIC METABOLIC PANEL
BUN/Creatinine Ratio: 9 — ABNORMAL LOW (ref 10–24)
BUN: 12 mg/dL (ref 8–27)
CO2: 22 mmol/L (ref 20–29)
Calcium: 8.9 mg/dL (ref 8.6–10.2)
Chloride: 106 mmol/L (ref 96–106)
Creatinine, Ser: 1.37 mg/dL — ABNORMAL HIGH (ref 0.76–1.27)
Glucose: 134 mg/dL — ABNORMAL HIGH (ref 70–99)
Potassium: 4.5 mmol/L (ref 3.5–5.2)
Sodium: 141 mmol/L (ref 134–144)
eGFR: 57 mL/min/{1.73_m2} — ABNORMAL LOW (ref >59)

## 2021-05-17 NOTE — Pre-Procedure Instructions (Signed)
Instructed patient on the following items: Arrival time 0830 Nothing to eat or drink after midnight No meds AM of procedure Responsible person to drive you home and stay with you for 24 hrs Wash with special soap night before and morning of procedure  

## 2021-05-20 ENCOUNTER — Other Ambulatory Visit: Payer: Self-pay

## 2021-05-20 ENCOUNTER — Ambulatory Visit (HOSPITAL_COMMUNITY)
Admission: RE | Admit: 2021-05-20 | Discharge: 2021-05-20 | Disposition: A | Discharge status: Home / Self Care | Payer: Medicare HMO | Source: Ambulatory Visit | Attending: Internal Medicine | Admitting: Internal Medicine

## 2021-05-20 ENCOUNTER — Ambulatory Visit (HOSPITAL_COMMUNITY): Payer: Medicare HMO

## 2021-05-20 ENCOUNTER — Encounter (HOSPITAL_COMMUNITY)
Admission: RE | Disposition: A | Discharge status: Home / Self Care | Payer: Medicare HMO | Source: Ambulatory Visit | Attending: Internal Medicine

## 2021-05-20 DIAGNOSIS — I110 Hypertensive heart disease with heart failure: Secondary | ICD-10-CM | POA: Insufficient documentation

## 2021-05-20 DIAGNOSIS — Z95810 Presence of automatic (implantable) cardiac defibrillator: Secondary | ICD-10-CM

## 2021-05-20 DIAGNOSIS — I428 Other cardiomyopathies: Secondary | ICD-10-CM | POA: Insufficient documentation

## 2021-05-20 DIAGNOSIS — F319 Bipolar disorder, unspecified: Secondary | ICD-10-CM | POA: Diagnosis not present

## 2021-05-20 DIAGNOSIS — E119 Type 2 diabetes mellitus without complications: Secondary | ICD-10-CM | POA: Diagnosis not present

## 2021-05-20 DIAGNOSIS — Z794 Long term (current) use of insulin: Secondary | ICD-10-CM | POA: Diagnosis not present

## 2021-05-20 DIAGNOSIS — Z7985 Long-term (current) use of injectable non-insulin antidiabetic drugs: Secondary | ICD-10-CM | POA: Diagnosis not present

## 2021-05-20 DIAGNOSIS — I5022 Chronic systolic (congestive) heart failure: Secondary | ICD-10-CM | POA: Insufficient documentation

## 2021-05-20 DIAGNOSIS — I517 Cardiomegaly: Secondary | ICD-10-CM | POA: Diagnosis not present

## 2021-05-20 HISTORY — PX: ICD IMPLANT: EP1208

## 2021-05-20 LAB — GLUCOSE, CAPILLARY: Glucose-Capillary: 149 mg/dL — ABNORMAL HIGH (ref 70–99)

## 2021-05-20 IMAGING — DX DG CHEST 2V
2 series · 2 of 2 positions shown · non-contrast
Comparison: [DATE]

CLINICAL DATA: Postop ICD

EXAM:
CHEST - 2 VIEW

[w chest pa]
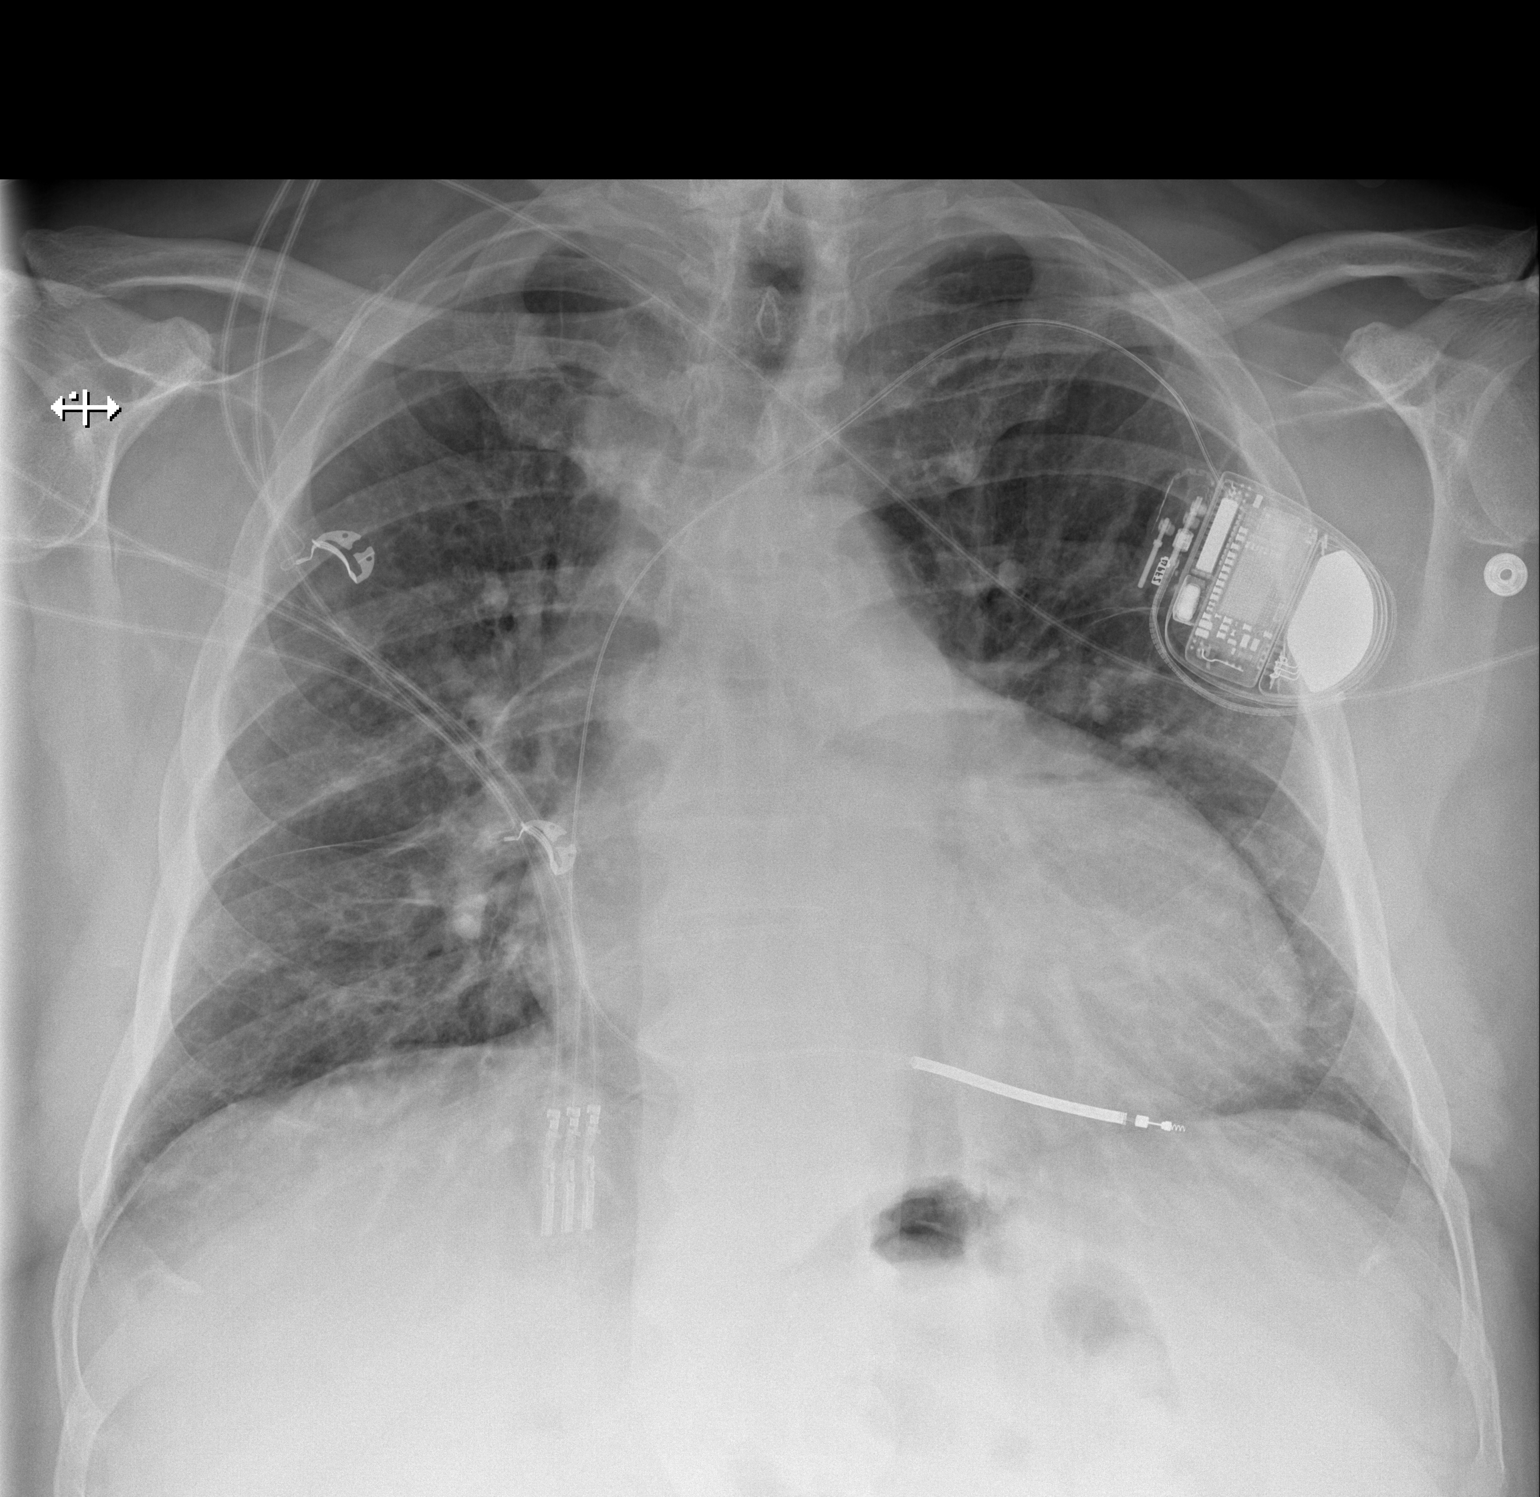

[w chest lat]
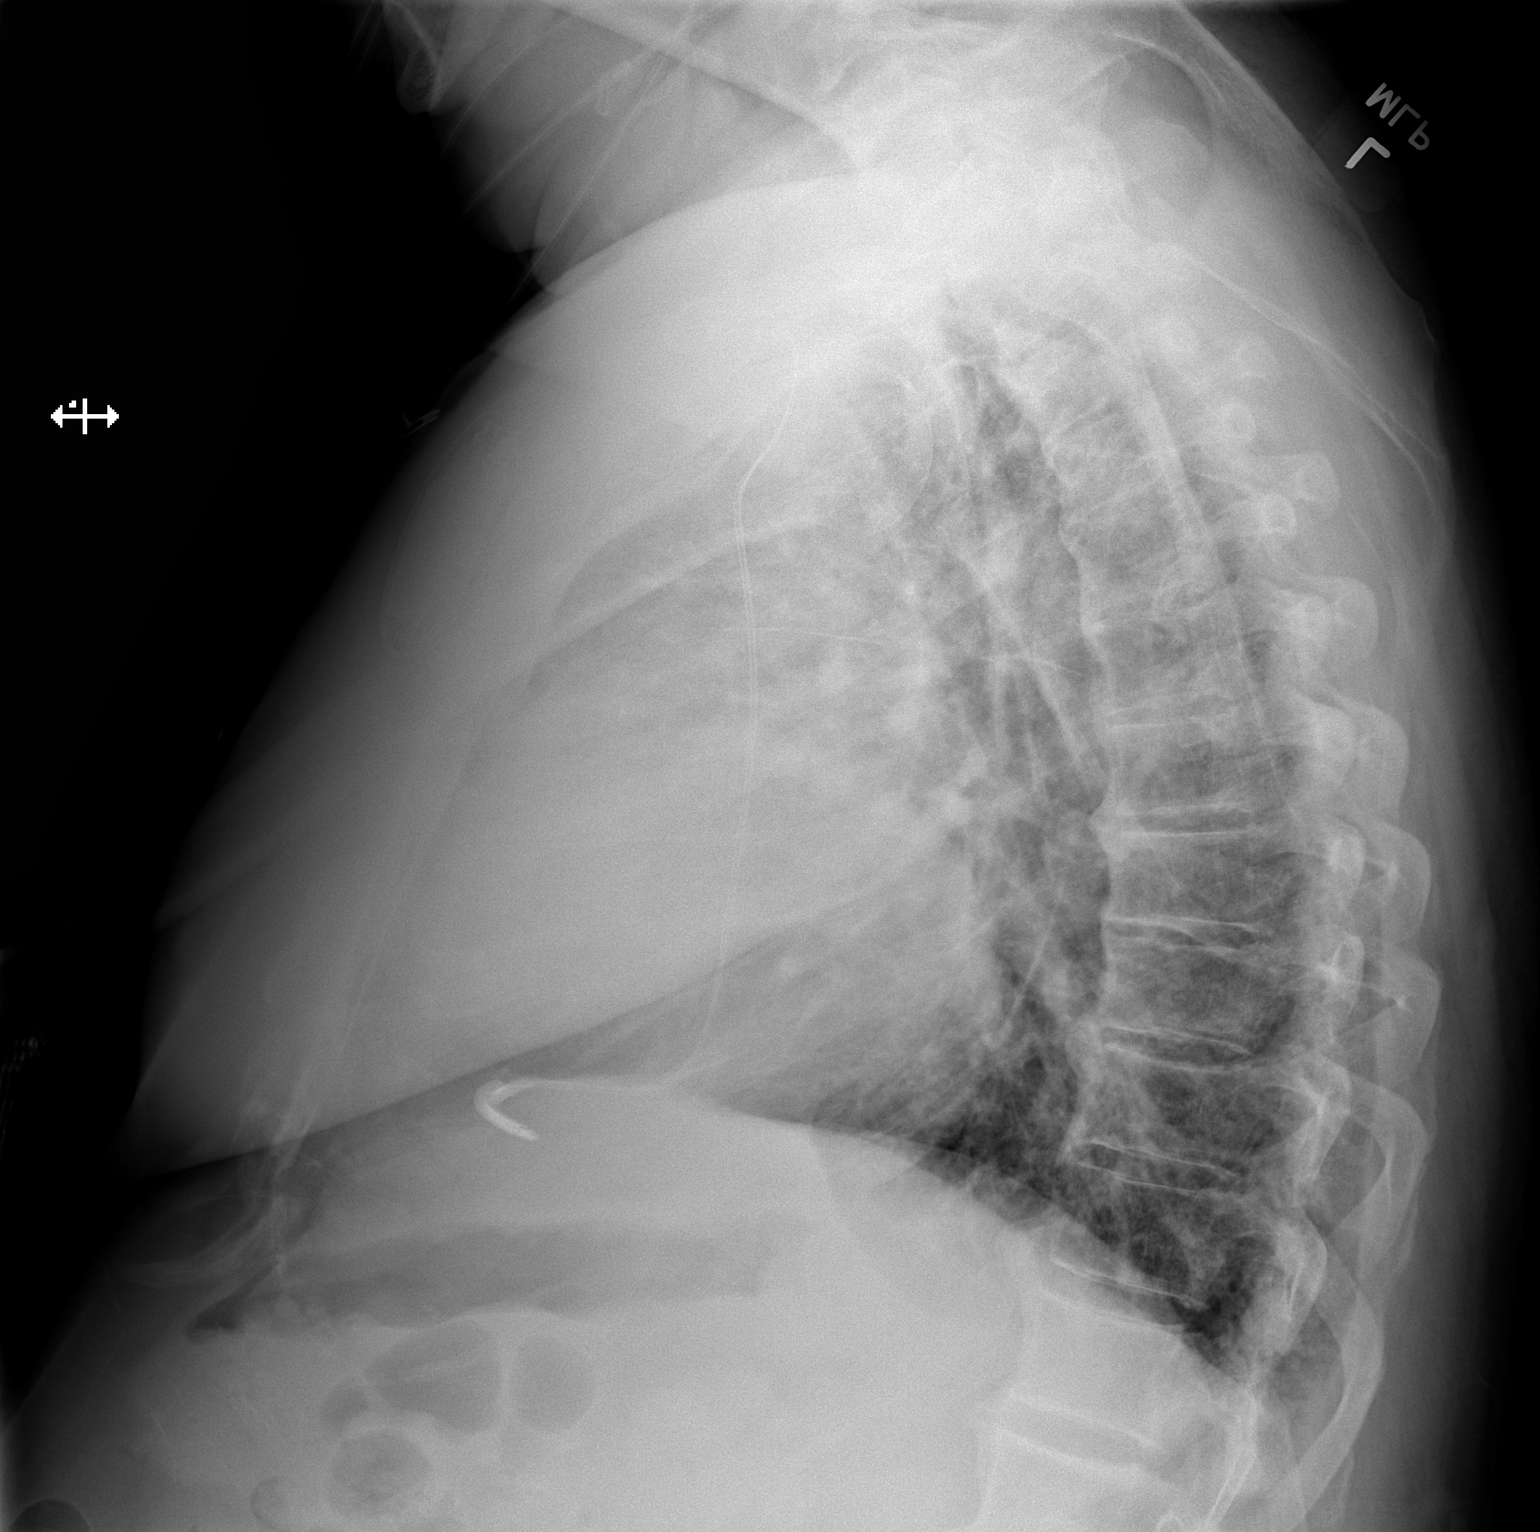

[2 of 2 positions shown; findings below may reference images not displayed]

FINDINGS: Status post left-sided ICD placement with leads projecting over
right ventricle. Cardiomegaly with mild central congestion. No focal
airspace disease, pleural effusion, or pneumothorax.
IMPRESSION: 1. Left-sided pacing device as above without visible pneumothorax.
2. Cardiomegaly with mild central congestion.

## 2021-05-20 SURGERY — ICD IMPLANT

## 2021-05-20 MED ORDER — LIDOCAINE HCL (PF) 1 % IJ SOLN
INTRAMUSCULAR | Status: DC | PRN
  Administered 2021-05-20: 60 mL

## 2021-05-20 MED ORDER — POVIDONE-IODINE 10 % EX SWAB
2.0000 "application " | Freq: Once | CUTANEOUS | Status: AC
  Administered 2021-05-20: 2 via TOPICAL

## 2021-05-20 MED ORDER — SODIUM CHLORIDE 0.9 % IV SOLN
INTRAVENOUS | Status: AC
  Filled 2021-05-20: qty 2

## 2021-05-20 MED ORDER — HEPARIN (PORCINE) IN NACL 1000-0.9 UT/500ML-% IV SOLN
INTRAVENOUS | Status: AC
  Filled 2021-05-20: qty 500

## 2021-05-20 MED ORDER — LOSARTAN POTASSIUM 25 MG PO TABS
50.0000 mg | ORAL_TABLET | Freq: Once | ORAL | Status: AC
  Administered 2021-05-20: 50 mg via ORAL
  Filled 2021-05-20: qty 2
  Filled 2021-05-20: qty 1

## 2021-05-20 MED ORDER — SODIUM CHLORIDE 0.9 % IV SOLN: INTRAVENOUS | Status: DC

## 2021-05-20 MED ORDER — ONDANSETRON HCL 4 MG/2ML IJ SOLN: 4.0000 mg | Freq: Four times a day (QID) | INTRAMUSCULAR | Status: DC | PRN

## 2021-05-20 MED ORDER — FENTANYL CITRATE (PF) 100 MCG/2ML IJ SOLN
INTRAMUSCULAR | Status: AC
  Filled 2021-05-20: qty 2

## 2021-05-20 MED ORDER — ACETAMINOPHEN 325 MG PO TABS: 325.0000 mg | ORAL_TABLET | ORAL | Status: DC | PRN

## 2021-05-20 MED ORDER — LIDOCAINE HCL (PF) 1 % IJ SOLN
INTRAMUSCULAR | Status: AC
  Filled 2021-05-20: qty 60

## 2021-05-20 MED ORDER — MIDAZOLAM HCL 5 MG/5ML IJ SOLN
INTRAMUSCULAR | Status: DC | PRN
  Administered 2021-05-20: 2 mg via INTRAVENOUS

## 2021-05-20 MED ORDER — HEPARIN (PORCINE) IN NACL 1000-0.9 UT/500ML-% IV SOLN
INTRAVENOUS | Status: DC | PRN
  Administered 2021-05-20: 500 mL

## 2021-05-20 MED ORDER — FENTANYL CITRATE (PF) 100 MCG/2ML IJ SOLN
INTRAMUSCULAR | Status: DC | PRN
  Administered 2021-05-20: 25 ug via INTRAVENOUS

## 2021-05-20 MED ORDER — SODIUM CHLORIDE 0.9 % IV SOLN
80.0000 mg | INTRAVENOUS | Status: AC
  Administered 2021-05-20: 80 mg

## 2021-05-20 MED ORDER — CHLORHEXIDINE GLUCONATE 4 % EX LIQD: 4.0000 "application " | Freq: Once | CUTANEOUS | Status: DC

## 2021-05-20 MED ORDER — CEFAZOLIN SODIUM-DEXTROSE 1-4 GM/50ML-% IV SOLN
1.0000 g | Freq: Once | INTRAVENOUS | Status: AC
  Administered 2021-05-20: 1 g via INTRAVENOUS
  Filled 2021-05-20 (×2): qty 50

## 2021-05-20 MED ORDER — CEFAZOLIN IN SODIUM CHLORIDE 3-0.9 GM/100ML-% IV SOLN
3.0000 g | INTRAVENOUS | Status: AC
  Administered 2021-05-20: 3 g via INTRAVENOUS
  Filled 2021-05-20 (×2): qty 100

## 2021-05-20 MED ORDER — CARVEDILOL 6.25 MG PO TABS
6.2500 mg | ORAL_TABLET | Freq: Once | ORAL | Status: AC
  Administered 2021-05-20: 6.25 mg via ORAL
  Filled 2021-05-20: qty 1

## 2021-05-20 MED ORDER — MIDAZOLAM HCL 5 MG/5ML IJ SOLN
INTRAMUSCULAR | Status: AC
  Filled 2021-05-20: qty 5

## 2021-05-20 SURGICAL SUPPLY — 7 items
CABLE SURGICAL S-101-97-12 (CABLE) ×2 IMPLANT
ICD VISIA MRI DVFB1D1 (ICD Generator) ×1 IMPLANT
LEAD SPRINT QUAT SEC 6935-65CM (Lead) ×1 IMPLANT
MAT PREVALON FULL STRYKER (MISCELLANEOUS) ×1 IMPLANT
PAD DEFIB RADIO PHYSIO CONN (PAD) ×2 IMPLANT
SHEATH 9FR PRELUDE SNAP 13 (SHEATH) ×1 IMPLANT
TRAY PACEMAKER INSERTION (PACKS) ×2 IMPLANT

## 2021-05-20 NOTE — Interval H&P Note (Signed)
History and Physical Interval Note:  05/20/2021 9:32 AM  Matthew Barnes  has presented today for surgery, with the diagnosis of heart failure.  The various methods of treatment have been discussed with the patient and family. After consideration of risks, benefits and other options for treatment, the patient has consented to  Procedure(s): ICD IMPLANT (N/A) as a surgical intervention.  The patient's history has been reviewed, patient examined, no change in status, stable for surgery.  I have reviewed the patient's chart and labs.  Questions were answered to the patient's satisfaction.     Matthew Barnes

## 2021-05-20 NOTE — Discharge Instructions (Addendum)
After Your ICD (Implantable Cardiac Defibrillator)   You have a [employer] ICD  ACTIVITY Do not lift your arm above shoulder height for 1 week after your procedure. After 7 days, you may progress as below.  You should remove your sling 24 hours after your procedure, unless otherwise instructed by your provider.     Monday May 27, 2021  Tuesday May 28, 2021 Wednesday May 29, 2021 Thursday May 30, 2021   Do not lift, push, pull, or carry anything over 10 pounds with the affected arm until 6 weeks (Monday July 01, 2021 ) after your procedure.   You may drive AFTER your wound check, unless you have been told otherwise by your provider.   Ask your healthcare provider when you can go back to work   INCISION/Dressing  If large square, outer bandage is left in place, this can be removed after 24 hours from your procedure. Do not remove steri-strips or glue as below.   Monitor your defibrillator site for redness, swelling, and drainage. Call the device clinic at [phone] if you experience these symptoms or fever/chills.  If your incision is sealed with Steri-strips or staples, you may shower 10 days after your procedure or when told by your provider. Do not remove the steri-strips or let the shower hit directly on your site. You may wash around your site with soap and water.    If you were discharged in a sling, please do not wear this during the day more than 48 hours after your surgery unless otherwise instructed. This may increase the risk of stiffness and soreness in your shoulder.   Avoid lotions, ointments, or perfumes over your incision until it is well-healed.  You may use a hot tub or a pool AFTER your wound check appointment if the incision is completely closed.  Your ICD is designed to protect you from life threatening heart rhythms. Because of this, you may receive a shock.   1 shock with no symptoms:  Call the office during business hours. 1 shock with symptoms  (chest pain, chest pressure, dizziness, lightheadedness, shortness of breath, overall feeling unwell):  Call 911. If you experience 2 or more shocks in 24 hours:  Call 911. If you receive a shock, you should not drive for 6 months per the [place] DMV IF you receive appropriate therapy from your ICD.   ICD Alerts:  Some alerts are vibratory and others beep. These are NOT emergencies. Please call our office to let us know. If this occurs at night or on weekends, it can wait until the next business day. Send a remote transmission.  If your device is capable of reading fluid status (for heart failure), you will be offered monthly monitoring to review this with you.   DEVICE MANAGEMENT Remote monitoring is used to monitor your ICD from home. This monitoring is scheduled every 91 days by our office. It allows [place] to keep an eye on the functioning of your device to ensure it is working properly. You will routinely see your Electrophysiologist annually (more often if necessary).   You should receive your ID card for your new device in 4-8 weeks. Keep this card with you at all times once received. Consider wearing a medical alert bracelet or necklace.  Your ICD  may be MRI compatible. This will be discussed at your next office visit/wound check.  You should avoid contact with strong electric or magnetic fields.   Do not use amateur (ham) radio equipment or electric (arc)  welding torches. MP3 player headphones with magnets should not be used. Some devices are safe to use if held at least 12 inches (30 cm) from your defibrillator. These include power tools, lawn mowers, and speakers. If you are unsure if something is safe to use, ask your health care provider.  When using your cell phone, hold it to the ear that is on the opposite side from the defibrillator. Do not leave your cell phone in a pocket over the defibrillator.  You may safely use electric blankets, heating pads, computers, and microwave ovens.  Call  the office right away if: You have chest pain. You feel more than one shock. You feel more short of breath than you have felt before. You feel more light-headed than you have felt before. Your incision starts to open up.  This information is not intended to replace advice given to you by your health care provider. Make sure you discuss any questions you have with your health care provider.

## 2021-05-20 NOTE — H&P (Signed)
I have seen Matthew Barnes is a 66 y.o. male in the office today who has been referred by Dr. Marlou Porch for consideration of ICD implant for primary prevention of sudden death.  The patient's chart has been reviewed and they meet criteria for ICD implant.  I have had a thorough discussion with the patient reviewing options.  The patient and their family (if available) have had opportunities to ask questions and have them answered. The patient and I have decided together through a shared decision making process to proceed with ICD at this time.  Risks, benefits, alternatives to ICD implantation were discussed in detail with the patient today. The patient  understands that the risks include but are not limited to bleeding, infection, pneumothorax, perforation, tamponade, vascular damage, renal failure, MI, stroke, death, inappropriate shocks, and lead dislodgement and he wishes to proceed.  We will therefore schedule device implantation at the next available time.   Carleene Overlie Laurel Smeltz,MD

## 2021-05-21 ENCOUNTER — Encounter (HOSPITAL_COMMUNITY): Payer: Self-pay | Admitting: Internal Medicine

## 2021-05-29 ENCOUNTER — Other Ambulatory Visit: Payer: Self-pay | Admitting: Endocrinology

## 2021-05-30 ENCOUNTER — Ambulatory Visit: Payer: Medicare HMO

## 2021-05-30 ENCOUNTER — Other Ambulatory Visit: Payer: Self-pay

## 2021-05-30 DIAGNOSIS — I420 Dilated cardiomyopathy: Secondary | ICD-10-CM

## 2021-05-30 LAB — CUP PACEART INCLINIC DEVICE CHECK
Battery Remaining Longevity: 136 mo
Battery Voltage: 3.1 V
Brady Statistic RV Percent Paced: 0.01 %
Date Time Interrogation Session: 20230302163553
HighPow Impedance: 60 Ohm
Implantable Lead Implant Date: 20230220
Implantable Lead Location: 753860
Implantable Lead Model: 6935
Implantable Pulse Generator Implant Date: 20230220
Lead Channel Impedance Value: 399 Ohm
Lead Channel Impedance Value: 494 Ohm
Lead Channel Pacing Threshold Amplitude: 0.5 V
Lead Channel Pacing Threshold Pulse Width: 0.4 ms
Lead Channel Sensing Intrinsic Amplitude: 15.125 mV
Lead Channel Setting Pacing Amplitude: 3.5 V
Lead Channel Setting Pacing Pulse Width: 0.4 ms
Lead Channel Setting Sensing Sensitivity: 0.3 mV

## 2021-05-30 NOTE — Patient Instructions (Signed)
? ?  After Your ICD ?(Implantable Cardiac Defibrillator) ? ? ? ?Monitor your defibrillator site for redness, swelling, and drainage. Call the device clinic at [phone] if you experience these symptoms or fever/chills. ? ?Your incision was closed with Steri-strips or staples:  You may shower and wash your incision with soap and water 2 times daily until completely healed and scabbing is resolved. Avoid lotions, ointments, or perfumes over your incision until it is well-healed.  ? ?You may use a hot tub or a pool after your wound check appointment if the incision is completely closed. ? ?Do not lift, push or pull greater than 10 pounds with the affected arm until 6 weeks after your procedure. There are no other restrictions in arm movement after your wound check appointment. April 4 ? ?Your ICD is MRI compatible. ? ?Your ICD is designed to protect you from life threatening heart rhythms. Because of this, you may receive a shock.  ? ?1 shock with no symptoms:  Call the office during business hours. ?1 shock with symptoms (chest pain, chest pressure, dizziness, lightheadedness, shortness of breath, overall feeling unwell):  Call 911. ?If you experience 2 or more shocks in 24 hours:  Call 911. ?If you receive a shock, you should not drive.  ?[place] DMV - no driving for 6 months if you receive appropriate therapy from your ICD.  ? ?ICD Alerts:  Some alerts are vibratory and others beep. These are NOT emergencies. Please call our office to let us know. If this occurs at night or on weekends, it can wait until the next business day. Send a remote transmission. ? ?If your device is capable of reading fluid status (for heart failure), you will be offered monthly monitoring to review this with you.  ? ?Remote monitoring is used to monitor your ICD from home. This monitoring is scheduled every 91 days by our office. It allows [place] to keep an eye on the functioning of your device to ensure it is working properly. You will routinely  see your Electrophysiologist annually (more often if necessary).  ?

## 2021-05-30 NOTE — Progress Notes (Signed)
Wound check appointment. Steri-strips removed. Scabbed are noted at both distal ends. Dr. Lovena Le in to assess. Patient is instructed to wash with warm soap and water BID until wound is completely healed. Incision edges approximated. Normal device function. Thresholds, sensing, and impedances consistent with implant measurements. Device programmed at 3.5V for extra safety margin until 3 month visit. Histogram distribution appropriate for patient and level of activity. No ventricular arrhythmias noted. Patient educated about wound care, arm mobility, lifting restrictions, shock plan. Patient enrolled in remote monitoring with next transmission scheduled 08/27/21. ROV in 3 months with implanting physician. ?

## 2021-06-06 ENCOUNTER — Institutional Professional Consult (permissible substitution): Payer: Medicare HMO | Admitting: Internal Medicine

## 2021-06-10 ENCOUNTER — Telehealth: Payer: Self-pay | Admitting: Pharmacist

## 2021-06-10 NOTE — Chronic Care Management (AMB) (Unsigned)
° ° °  Chronic Care Management Pharmacy Assistant   Name: Matthew Barnes  MRN: [id number] DOB: 26-Feb-1956  Reason for Encounter: Disease State Hypertension and Diabetes Assessment Call   Conditions to be addressed/monitored: HTN and DMII  Recent office visits:  02/28/2021 Grier Mitts MD - Patient was seen for a cough and additional issue. Started Augmenting. Benzonatate and Flonase. Follow up in 1 week.   02/25/2021 Glenna Durand LPN - Medicare annual wellness exam.   Recent consult visits:  05/14/2021 [name] MD (cardiology) - Patient was seen for chronic systolic heart failure. Decreased Furosemide to 20 mg daily. Discontinued Jardiance. No follow up noted.   04/19/2021 Elayne Snare MD (endocrinology) - Patient was seen for Uncontrolled type 2 diabetes mellitus with hyperglycemia, with long-term current use of insulin and additional issues. Changed Semaglutide to 0.5 mg weekly. Follow up in 2 months.   04/12/2021 Candee Furbish MD (cardiology) - Patient was seen for chronic systolic heart failure and additional issues. Discontinued Benzonatate and pantoprazole. Follow up in 3 months.   03/14/2021 Masoud Hijazi (psychiatry) - Patient was seen for bipolar disorder, current episode depressed, moderate.   03/08/2021 Ermalene Searing (chropractor) - Patient was seen for Segmental and somatic dysfunction of cervical region and additional issues.   Hospital visits:  None  Medications: Outpatient Encounter Medications as of 06/10/2021  Medication Sig   Accu-Chek Softclix Lancets lancets Check blood sugar 2 times daily   atorvastatin (LIPITOR) 20 MG tablet TAKE 1 TABLET BY MOUTH EVERY DAY   Blood Glucose Monitoring Suppl (ACCU-CHEK GUIDE) w/Device KIT Check blood sugar 2 times daily   Blood Pressure Monitoring (BLOOD PRESSURE CUFF) MISC Use as directed   carvedilol (COREG) 6.25 MG tablet Take 12.5 mg by mouth 2 (two) times daily with a meal.   cholecalciferol (VITAMIN D3) 25 MCG (1000 UNIT)  tablet Take 1,000 Units by mouth daily.   digoxin (LANOXIN) 0.125 MG tablet TAKE 1 TABLET BY MOUTH DAILY   furosemide (LASIX) 20 MG tablet Take 1 tablet (20 mg total) by mouth daily.   glucose blood (ACCU-CHEK GUIDE) test strip Use to check blood sugar 2 times daily   glucose blood (ONETOUCH VERIO) test strip USE AS INSTRUCTED TO CHECK BLOOD SUGAR 2 TIMES A DAY.   Insulin Syringe-Needle U-100 (INSULIN SYRINGE 1CC/31GX5/16") 31G X 5/16" 1 ML MISC Use 3 needles per day   lithium carbonate 300 MG capsule Take 3 capsules (900 mg total) by mouth daily. (Patient taking differently: Take 300-600 mg by mouth See admin instructions. 300 mg midday, 600 mg at bedtime)   losartan (COZAAR) 50 MG tablet Take 1 tablet (50 mg total) by mouth daily.   NOVOLIN 70/30 (70-30) 100 UNIT/ML injection INJECT 40 UNITS INTO THE SKIN 2 (TWO) TIMES DAILY WITH A MEAL.   pantoprazole (PROTONIX) 20 MG tablet TAKE 1 TABLET BY MOUTH TWICE A DAY BEFORE MEALS   QUEtiapine (SEROQUEL) 200 MG tablet Take 450 mg by mouth at bedtime.   Semaglutide,0.25 or 0.5MG/DOS, (OZEMPIC, 0.25 OR 0.5 MG/DOSE,) 2 MG/1.5ML SOPN Inject 0.5 mg into the skin once a week. Start with 0.25 mg weekly for 2 weeks then inject 0.5 mg weekly   sildenafil (VIAGRA) 50 MG tablet Take 1 tablet (50 mg total) by mouth daily as needed for erectile dysfunction. Do not take more then once in 24 hours.   No facility-administered encounter medications on file as of 06/10/2021.  Fill History:    Care Gaps:   Star Rating Drugs:   SIG***

## 2021-06-13 DIAGNOSIS — F3132 Bipolar disorder, current episode depressed, moderate: Secondary | ICD-10-CM | POA: Diagnosis not present

## 2021-06-18 ENCOUNTER — Other Ambulatory Visit: Payer: Self-pay | Admitting: Endocrinology

## 2021-06-18 ENCOUNTER — Other Ambulatory Visit: Payer: Self-pay

## 2021-06-18 ENCOUNTER — Other Ambulatory Visit (INDEPENDENT_AMBULATORY_CARE_PROVIDER_SITE_OTHER): Payer: Medicare HMO

## 2021-06-18 DIAGNOSIS — E669 Obesity, unspecified: Secondary | ICD-10-CM | POA: Diagnosis not present

## 2021-06-18 DIAGNOSIS — F3160 Bipolar disorder, current episode mixed, unspecified: Secondary | ICD-10-CM | POA: Diagnosis not present

## 2021-06-18 DIAGNOSIS — Z794 Long term (current) use of insulin: Secondary | ICD-10-CM

## 2021-06-18 DIAGNOSIS — N289 Disorder of kidney and ureter, unspecified: Secondary | ICD-10-CM

## 2021-06-18 DIAGNOSIS — Z8639 Personal history of other endocrine, nutritional and metabolic disease: Secondary | ICD-10-CM | POA: Diagnosis not present

## 2021-06-18 DIAGNOSIS — E1165 Type 2 diabetes mellitus with hyperglycemia: Secondary | ICD-10-CM

## 2021-06-18 DIAGNOSIS — E1169 Type 2 diabetes mellitus with other specified complication: Secondary | ICD-10-CM

## 2021-06-18 LAB — BASIC METABOLIC PANEL
BUN: 11 mg/dL (ref 6–23)
CO2: 30 mEq/L (ref 19–32)
Calcium: 9.1 mg/dL (ref 8.4–10.5)
Chloride: 102 mEq/L (ref 96–112)
Creatinine, Ser: 1.35 mg/dL (ref 0.40–1.50)
GFR: 55.19 mL/min — ABNORMAL LOW (ref >60.00)
Glucose, Bld: 226 mg/dL — ABNORMAL HIGH (ref 70–99)
Potassium: 4.2 mEq/L (ref 3.5–5.1)
Sodium: 139 mEq/L (ref 135–145)

## 2021-06-18 LAB — T4, FREE: Free T4: 0.83 ng/dL (ref 0.60–1.60)

## 2021-06-18 LAB — TSH: TSH: 2.39 u[IU]/mL (ref 0.35–5.50)

## 2021-06-19 ENCOUNTER — Encounter: Payer: Self-pay | Admitting: Family Medicine

## 2021-06-19 LAB — FRUCTOSAMINE: Fructosamine: 301 umol/L — ABNORMAL HIGH (ref 0–285)

## 2021-06-21 ENCOUNTER — Other Ambulatory Visit: Payer: Self-pay

## 2021-06-21 ENCOUNTER — Encounter: Payer: Self-pay | Admitting: Endocrinology

## 2021-06-21 ENCOUNTER — Ambulatory Visit: Payer: Medicare HMO | Admitting: Endocrinology

## 2021-06-21 ENCOUNTER — Ambulatory Visit (INDEPENDENT_AMBULATORY_CARE_PROVIDER_SITE_OTHER): Payer: Medicare HMO | Admitting: Endocrinology

## 2021-06-21 VITALS — BP 140/85 | HR 85 | Ht 73.0 in | Wt 309.0 lb

## 2021-06-21 DIAGNOSIS — Z794 Long term (current) use of insulin: Secondary | ICD-10-CM

## 2021-06-21 DIAGNOSIS — N289 Disorder of kidney and ureter, unspecified: Secondary | ICD-10-CM | POA: Diagnosis not present

## 2021-06-21 DIAGNOSIS — E1165 Type 2 diabetes mellitus with hyperglycemia: Secondary | ICD-10-CM

## 2021-06-21 NOTE — Patient Instructions (Signed)
Increase am insulin to 60 units ? ?Take 2 shots of 0.5 same day ? ?Check blood sugars on waking up 3 days a week ? ?Also check blood sugars about 2 hours after meals and do this after different meals by rotation ? ?Recommended blood sugar levels on waking up are 90-130 and about 2 hours after meal is 130-160 ? ?Please bring your blood sugar monitor to each visit, thank you ? ?

## 2021-06-21 NOTE — Progress Notes (Signed)
Patient ID: Matthew Barnes, male   DOB: [birth year], 66 y.o.   MRN: [id number] ? ? ? ?Reason for Appointment: Followup for Type 2 Diabetes ? ? ? ?History of Present Illness:  ?        ?Diagnosis: Type 2 diabetes mellitus, date of diagnosis:   2011      ? ?Past history:  ?He was started on metformin at diagnosis and apparently did have fairly good control initially ?However about a year later because of poor control he was given insulin in addition. ?He had been taking Lantus insulin until about 2/15, up to 80 units a day ?However he does not think his blood sugars  Were controlled with this ?Because of higher A1c of 13.1% he was referred here for diabetes management in 6/15; was started on glucose monitoring and insulin was changed from low dose Levemir to Humalog mix 70 units a day ?He was also started on Victoza  to help with blood sugar control and facilitate weight loss on his initial consultation ? ?Recent history:  ? ?INSULIN regimen is described as: Novolin mix 70/30 with syringe, 50 units at breakfast, 40 units in p.m ? ?Oral hypoglycemic drugs the patient is taking are: Ozempic 0.5 mg weekly ? ? ?A1c was previously slightly higher at 7.5 compared to 7.3; has been as low as 5.8 and in 5/22 was 8.3 ? ?Fructosamine is 301 now, previously at 331 ? ? Current blood sugar patterns, management of diabetes and problems: ?He was able to continue Ozempic regularly without any side effects or nausea ?However he says that because of his increased depression he has not been able to watch his diet at all and has gained weight ?Blood sugars are fairly consistently high although not as consistently in the evening ?Not clear which of his readings are before or after meals with highest reading midday ?Generally tending to have more snacks ?Does not think he has decrease in his hunger with 0.5 mg Ozempic although this is more related to his depression ?Usually taking his insulin twice a day as before ?Previously Vania Rea was  started because of lack of benefit and increased frequency of urination, possibly worsening renal function in combination with diuretics ?He will exercise sometimes on the elliptical ? ? ?Side effects from medications have been: Polyuria from Sunol ? ?Glucose monitoring:  done about 1 times a day or less       Glucometer:  Verio  ? ?Blood Glucose readings ? ? ?PRE-MEAL Fasting Lunch Dinner Bedtime Overall  ?Glucose range: 155-201 187-250 132-179 181   ?Mean/median:     183  ? ?Previously: ? ?FASTING range 94-226 ?AVERAGE for 30 days = 174 ?  ? ?Glycemic control:  ? ?Lab Results  ?Component Value Date  ? HGBA1C 7.5 (H) 04/09/2021  ? HGBA1C 7.3 (A) 11/01/2020  ? HGBA1C 8.3 (H) 07/31/2020  ? ?Lab Results  ?Component Value Date  ? MICROALBUR 1.9 02/28/2021  ? Novi 79 07/31/2020  ? CREATININE 1.35 06/18/2021  ? ?Lab Results  ?Component Value Date  ? FRUCTOSAMINE 301 (H) 06/18/2021  ? FRUCTOSAMINE 331 (H) 12/11/2020  ? FRUCTOSAMINE 310 (H) 05/21/2020  ? ? ?Self-care: The diet that the patient has been following is: None, unable to control portions and carbs  when depressed ? Meals: 3 meals per day (dinner 6 pm-10 pm) ?       ?Dietician visit: Most recent: never      ?CDE visit: 08/2013          ?  ? ?  Weight history: baseline 295 ? ?Wt Readings from Last 3 Encounters:  ?06/21/21 (!) 309 lb (140.2 kg)  ?05/20/21 300 lb (136.1 kg)  ?05/14/21 (!) 308 lb (139.7 kg)  ? ?Lab on 06/18/2021  ?Component Date Value Ref Range Status  ? Fructosamine 06/18/2021 301 (H)  0 - 285 umol/L Final  ? Comment: Published reference interval for apparently healthy subjects ?between age 48 and 48 is 32 - 285 umol/L and in a poorly ?controlled diabetic population is 228 - 563 umol/L with a ?mean of 396 umol/L. ?  ? Sodium 06/18/2021 139  135 - 145 mEq/L Final  ? Potassium 06/18/2021 4.2  3.5 - 5.1 mEq/L Final  ? Chloride 06/18/2021 102  96 - 112 mEq/L Final  ? CO2 06/18/2021 30  19 - 32 mEq/L Final  ? Glucose, Bld 06/18/2021 226 (H)  70 - 99  mg/dL Final  ? BUN 06/18/2021 11  6 - 23 mg/dL Final  ? Creatinine, Ser 06/18/2021 1.35  0.40 - 1.50 mg/dL Final  ? GFR 06/18/2021 55.19 (L)  >60.00 mL/min Final  ? Calculated using the CKD-EPI Creatinine Equation (2021)  ? Calcium 06/18/2021 9.1  8.4 - 10.5 mg/dL Final  ? Free T4 06/18/2021 0.83  0.60 - 1.60 ng/dL Final  ? Comment: Specimens from patients who are undergoing biotin therapy and /or ingesting biotin supplements may contain high levels of biotin.  The higher biotin concentration in these specimens interferes with this Free T4 assay.  Specimens that contain high levels  ?of biotin may cause false high results for this Free T4 assay.  Please interpret results in light of the total clinical presentation of the patient.  ?  ? TSH 06/18/2021 2.39  0.35 - 5.50 uIU/mL Final  ? ? ? ? ?Allergies as of 06/21/2021   ? ?   Reactions  ? Influenza Vaccines Swelling, Other (See Comments)  ? Reports of swelling of the arm and then was "sick" for 10 days  ? ?  ? ?  ?Medication List  ?  ? ?  ? Accurate as of June 21, 2021  7:50 PM. If you have any questions, ask your nurse or doctor.  ?  ?  ? ?  ? ?Accu-Chek Guide w/Device Kit ?Check blood sugar 2 times daily ?  ?Accu-Chek Softclix Lancets lancets ?Check blood sugar 2 times daily ?  ?atorvastatin 20 MG tablet ?Commonly known as: LIPITOR ?TAKE 1 TABLET BY MOUTH EVERY DAY ?  ?Blood Pressure Cuff Misc ?Use as directed ?  ?carvedilol 6.25 MG tablet ?Commonly known as: COREG ?Take 12.5 mg by mouth 2 (two) times daily with a meal. ?  ?cholecalciferol 25 MCG (1000 UNIT) tablet ?Commonly known as: VITAMIN D3 ?Take 1,000 Units by mouth daily. ?  ?digoxin 0.125 MG tablet ?Commonly known as: LANOXIN ?TAKE 1 TABLET BY MOUTH DAILY ?  ?furosemide 20 MG tablet ?Commonly known as: LASIX ?Take 1 tablet (20 mg total) by mouth daily. ?  ?INSULIN SYRINGE 1CC/31GX5/16" 31G X 5/16" 1 ML Misc ?Use 3 needles per day ?  ?lithium carbonate 300 MG capsule ?Take 3 capsules (900 mg total) by mouth  daily. ?What changed:  ?how much to take ?when to take this ?additional instructions ?  ?losartan 50 MG tablet ?Commonly known as: COZAAR ?Take 1 tablet (50 mg total) by mouth daily. ?  ?NovoLIN 70/30 (70-30) 100 UNIT/ML injection ?Generic drug: insulin NPH-regular Human ?INJECT 40 UNITS INTO THE SKIN 2 (TWO) TIMES DAILY WITH A MEAL. ?What changed: additional  instructions ?  ?OneTouch Verio test strip ?Generic drug: glucose blood ?USE AS INSTRUCTED TO CHECK BLOOD SUGAR 2 TIMES A DAY. ?  ?Accu-Chek Guide test strip ?Generic drug: glucose blood ?Use to check blood sugar 2 times daily ?  ?Ozempic (0.25 or 0.5 MG/DOSE) 2 MG/1.5ML Sopn ?Generic drug: Semaglutide(0.25 or 0.5MG/DOS) ?Inject 0.5 mg into the skin once a week. Start with 0.25 mg weekly for 2 weeks then inject 0.5 mg weekly ?  ?pantoprazole 20 MG tablet ?Commonly known as: PROTONIX ?TAKE 1 TABLET BY MOUTH TWICE A DAY BEFORE MEALS ?  ?QUEtiapine 200 MG tablet ?Commonly known as: SEROQUEL ?Take 450 mg by mouth at bedtime. ?  ?sildenafil 50 MG tablet ?Commonly known as: Viagra ?Take 1 tablet (50 mg total) by mouth daily as needed for erectile dysfunction. Do not take more then once in 24 hours. ?  ? ?  ? ? ?Allergies:  ?Allergies  ?Allergen Reactions  ? Influenza Vaccines Swelling and Other (See Comments)  ?  Reports of swelling of the arm and then was "sick" for 10 days  ? ? ?Past Medical History:  ?Diagnosis Date  ? Bipolar II disorder - Managed by Marye Round [phone]) 05/03/2013  ? Diabetes mellitus   ? Dysphagia   ? Hyperlipidemia   ? Hypertension   ? Unspecified hypothyroidism 05/03/2013  ? ? ?Past Surgical History:  ?Procedure Laterality Date  ? ICD IMPLANT N/A 05/20/2021  ? Procedure: ICD IMPLANT;  Surgeon: Evans Lance, MD;  Location: [place];  Service: Cardiovascular;  Laterality: N/A;  ? KNEE SURGERY    ? scope on left knee  ? RIGHT/LEFT HEART CATH AND CORONARY ANGIOGRAPHY N/A 08/07/2020  ? Procedure: RIGHT/LEFT HEART CATH AND  CORONARY ANGIOGRAPHY;  Surgeon: Leonie Man, MD;  Location: [place] [facility];  Service: Cardiovascular;  Laterality: N/A;  ? ? ?Family History  ?Problem Relation Age of Onset  ? Diabetes Mother   ? Hy

## 2021-07-07 ENCOUNTER — Emergency Department (HOSPITAL_COMMUNITY): Payer: Medicare HMO

## 2021-07-07 ENCOUNTER — Emergency Department (HOSPITAL_COMMUNITY)
Admission: EM | Admit: 2021-07-07 | Discharge: 2021-07-07 | Disposition: A | Discharge status: Home / Self Care | Payer: Medicare HMO | Attending: Emergency Medicine | Admitting: Emergency Medicine

## 2021-07-07 ENCOUNTER — Encounter (HOSPITAL_COMMUNITY): Payer: Self-pay

## 2021-07-07 DIAGNOSIS — Z79899 Other long term (current) drug therapy: Secondary | ICD-10-CM | POA: Insufficient documentation

## 2021-07-07 DIAGNOSIS — R059 Cough, unspecified: Secondary | ICD-10-CM | POA: Diagnosis not present

## 2021-07-07 DIAGNOSIS — R140 Abdominal distension (gaseous): Secondary | ICD-10-CM | POA: Diagnosis not present

## 2021-07-07 DIAGNOSIS — R0600 Dyspnea, unspecified: Secondary | ICD-10-CM

## 2021-07-07 DIAGNOSIS — R160 Hepatomegaly, not elsewhere classified: Secondary | ICD-10-CM | POA: Diagnosis not present

## 2021-07-07 DIAGNOSIS — Z794 Long term (current) use of insulin: Secondary | ICD-10-CM | POA: Diagnosis not present

## 2021-07-07 DIAGNOSIS — E119 Type 2 diabetes mellitus without complications: Secondary | ICD-10-CM | POA: Insufficient documentation

## 2021-07-07 DIAGNOSIS — I5033 Acute on chronic diastolic (congestive) heart failure: Secondary | ICD-10-CM | POA: Insufficient documentation

## 2021-07-07 DIAGNOSIS — K7689 Other specified diseases of liver: Secondary | ICD-10-CM | POA: Diagnosis not present

## 2021-07-07 DIAGNOSIS — I110 Hypertensive heart disease with heart failure: Secondary | ICD-10-CM | POA: Diagnosis not present

## 2021-07-07 DIAGNOSIS — R000 Tachycardia, unspecified: Secondary | ICD-10-CM | POA: Diagnosis not present

## 2021-07-07 DIAGNOSIS — K429 Umbilical hernia without obstruction or gangrene: Secondary | ICD-10-CM | POA: Diagnosis not present

## 2021-07-07 DIAGNOSIS — I509 Heart failure, unspecified: Secondary | ICD-10-CM

## 2021-07-07 DIAGNOSIS — Z20822 Contact with and (suspected) exposure to covid-19: Secondary | ICD-10-CM | POA: Diagnosis not present

## 2021-07-07 DIAGNOSIS — N2882 Megaloureter: Secondary | ICD-10-CM | POA: Diagnosis not present

## 2021-07-07 DIAGNOSIS — Z950 Presence of cardiac pacemaker: Secondary | ICD-10-CM | POA: Diagnosis not present

## 2021-07-07 DIAGNOSIS — I700 Atherosclerosis of aorta: Secondary | ICD-10-CM | POA: Diagnosis not present

## 2021-07-07 DIAGNOSIS — R0602 Shortness of breath: Secondary | ICD-10-CM | POA: Diagnosis not present

## 2021-07-07 LAB — PROTIME-INR
INR: 1.1 (ref 0.8–1.2)
Prothrombin Time: 13.9 seconds (ref 11.4–15.2)

## 2021-07-07 LAB — COMPREHENSIVE METABOLIC PANEL
ALT: 20 U/L (ref 0–44)
AST: 26 U/L (ref 15–41)
Albumin: 4 g/dL (ref 3.5–5.0)
Alkaline Phosphatase: 50 U/L (ref 38–126)
Anion gap: 8 (ref 5–15)
BUN: 16 mg/dL (ref 8–23)
CO2: 26 mmol/L (ref 22–32)
Calcium: 8.9 mg/dL (ref 8.9–10.3)
Chloride: 108 mmol/L (ref 98–111)
Creatinine, Ser: 1.3 mg/dL — ABNORMAL HIGH (ref 0.61–1.24)
GFR, Estimated: >60 mL/min (ref >60)
Glucose, Bld: 180 mg/dL — ABNORMAL HIGH (ref 70–99)
Potassium: 3.9 mmol/L (ref 3.5–5.1)
Sodium: 142 mmol/L (ref 135–145)
Total Bilirubin: 0.6 mg/dL (ref 0.3–1.2)
Total Protein: 6.8 g/dL (ref 6.5–8.1)

## 2021-07-07 LAB — BRAIN NATRIURETIC PEPTIDE: B Natriuretic Peptide: 222.6 pg/mL — ABNORMAL HIGH (ref 0.0–100.0)

## 2021-07-07 LAB — URINALYSIS, ROUTINE W REFLEX MICROSCOPIC
Bilirubin Urine: NEGATIVE
Glucose, UA: NEGATIVE mg/dL
Hgb urine dipstick: NEGATIVE
Ketones, ur: NEGATIVE mg/dL
Leukocytes,Ua: NEGATIVE
Nitrite: NEGATIVE
Protein, ur: NEGATIVE mg/dL
Specific Gravity, Urine: 1.011 (ref 1.005–1.030)
pH: 6 (ref 5.0–8.0)

## 2021-07-07 LAB — CBC WITH DIFFERENTIAL/PLATELET
Abs Immature Granulocytes: 0.02 10*3/uL (ref 0.00–0.07)
Basophils Absolute: 0 10*3/uL (ref 0.0–0.1)
Basophils Relative: 1 %
Eosinophils Absolute: 0.2 10*3/uL (ref 0.0–0.5)
Eosinophils Relative: 3 %
HCT: 39.3 % (ref 39.0–52.0)
Hemoglobin: 12.6 g/dL — ABNORMAL LOW (ref 13.0–17.0)
Immature Granulocytes: 0 %
Lymphocytes Relative: 22 %
Lymphs Abs: 1.4 10*3/uL (ref 0.7–4.0)
MCH: 26.4 pg (ref 26.0–34.0)
MCHC: 32.1 g/dL (ref 30.0–36.0)
MCV: 82.2 fL (ref 80.0–100.0)
Monocytes Absolute: 0.4 10*3/uL (ref 0.1–1.0)
Monocytes Relative: 7 %
Neutro Abs: 4.2 10*3/uL (ref 1.7–7.7)
Neutrophils Relative %: 67 %
Platelets: 132 10*3/uL — ABNORMAL LOW (ref 150–400)
RBC: 4.78 MIL/uL (ref 4.22–5.81)
RDW: 14.4 % (ref 11.5–15.5)
WBC: 6.2 10*3/uL (ref 4.0–10.5)
nRBC: 0 % (ref 0.0–0.2)

## 2021-07-07 LAB — TROPONIN I (HIGH SENSITIVITY)
Troponin I (High Sensitivity): 34 ng/L — ABNORMAL HIGH (ref <18)
Troponin I (High Sensitivity): 35 ng/L — ABNORMAL HIGH (ref <18)

## 2021-07-07 LAB — RESP PANEL BY RT-PCR (FLU A&B, COVID) ARPGX2
Influenza A by PCR: NEGATIVE
Influenza B by PCR: NEGATIVE
SARS Coronavirus 2 by RT PCR: NEGATIVE

## 2021-07-07 LAB — LIPASE, BLOOD: Lipase: 33 U/L (ref 11–51)

## 2021-07-07 IMAGING — CT CT ABD-PELV W/ CM
2 of 5 series · 15 of 46 positions shown, 17 images · IV contrast (agent unspecified)
Comparison: CT angiography chest [DATE].

CLINICAL DATA: Bowel obstruction suspected abdominal distension x 3
days, nausea, minimal stools - evaluate for SBO. Possible ascites
noted on exam, new

EXAM:
CT ABDOMEN AND PELVIS WITH CONTRAST
TECHNIQUE: Multidetector CT imaging of the abdomen and pelvis was performed
using the standard protocol following bolus administration of
intravenous contrast.

[Series 2: axial st · axial · 0.98mm/px · z∈[+1021,+1456]mm · 12 of 103 slices shown, 14 images]
[im 8/103  soft-tissue]
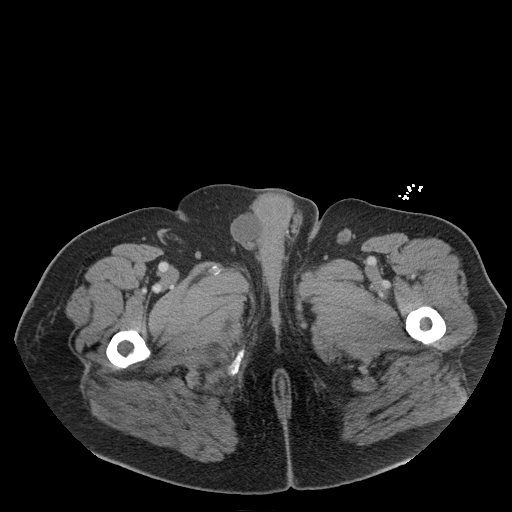
[im 8/103  bone]
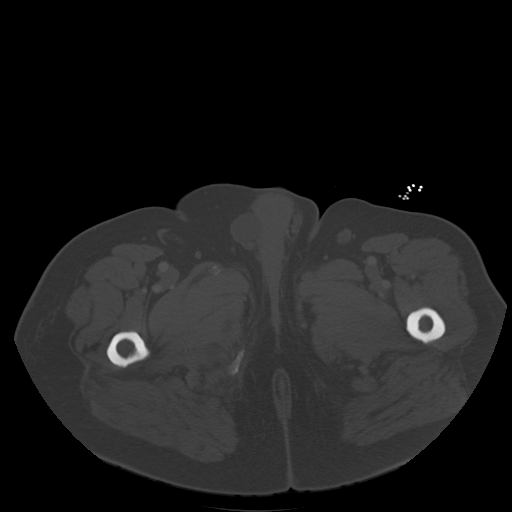
[im 15/103  soft-tissue]
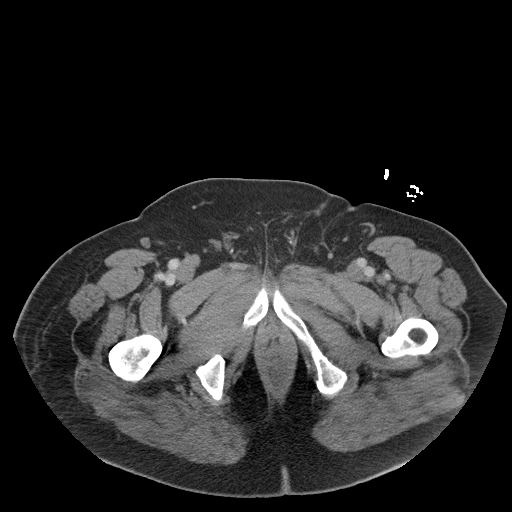
[im 22/103  soft-tissue]
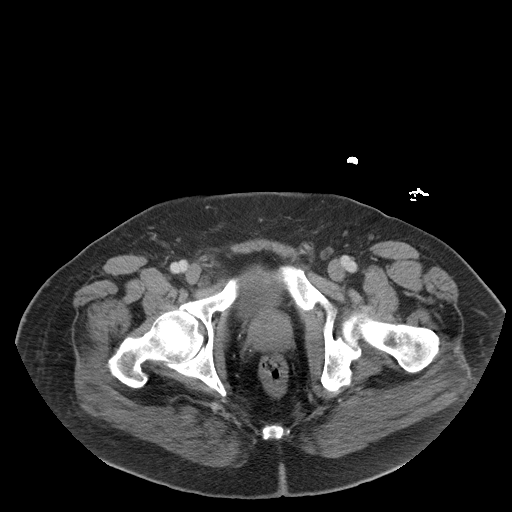
[im 30/103  soft-tissue]
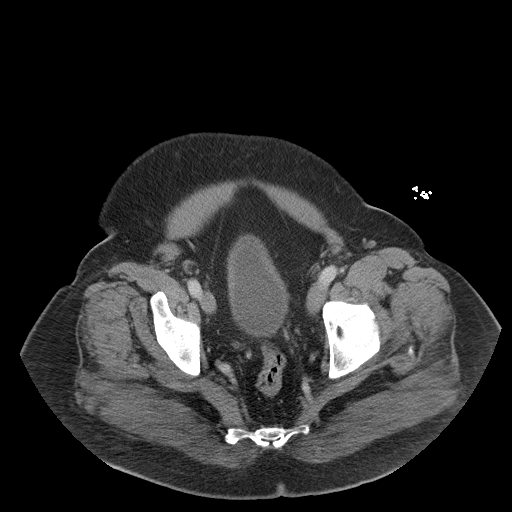
[im 37/103  soft-tissue]
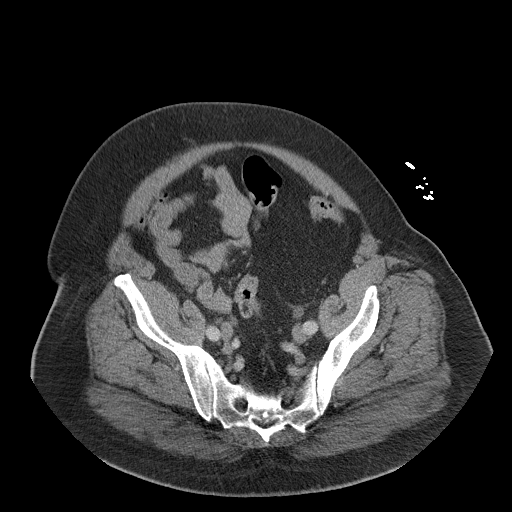
[im 44/103  soft-tissue]
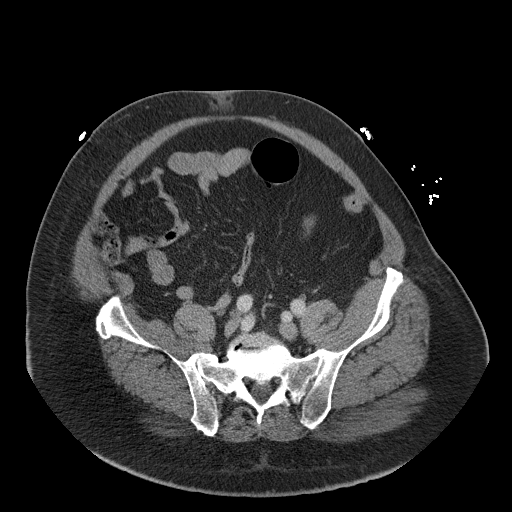
[im 59/103  soft-tissue]
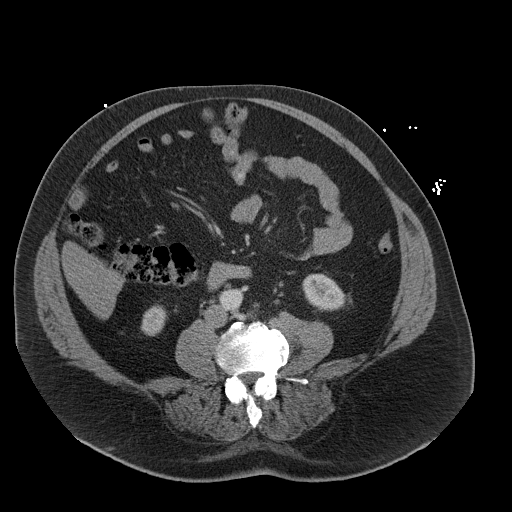
[im 66/103  soft-tissue]
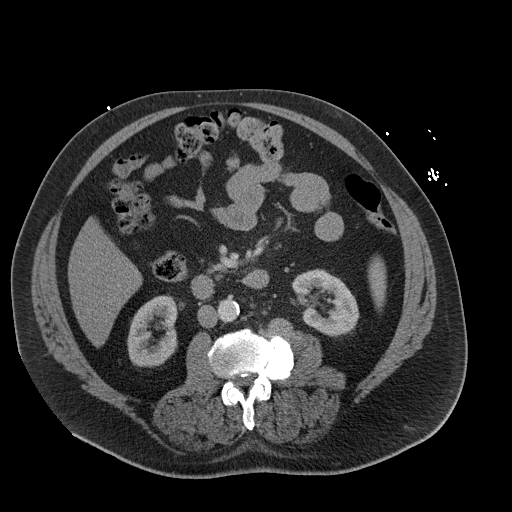
[im 73/103  soft-tissue]
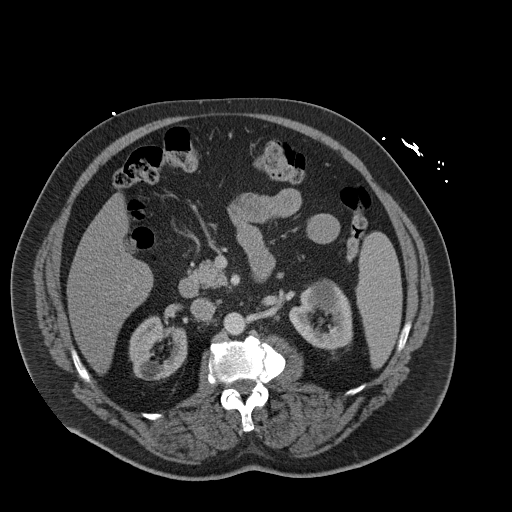
[im 73/103  bone]
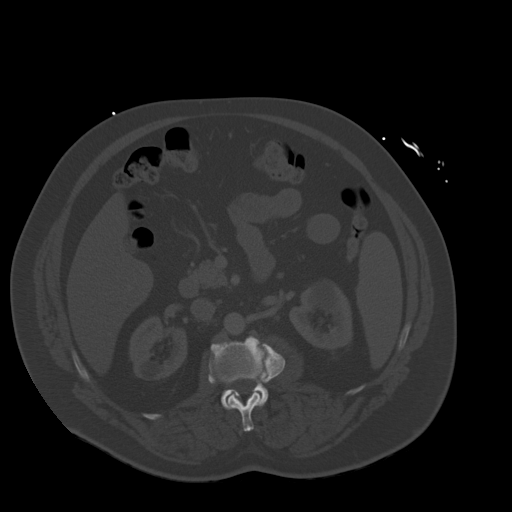
[im 81/103  soft-tissue]
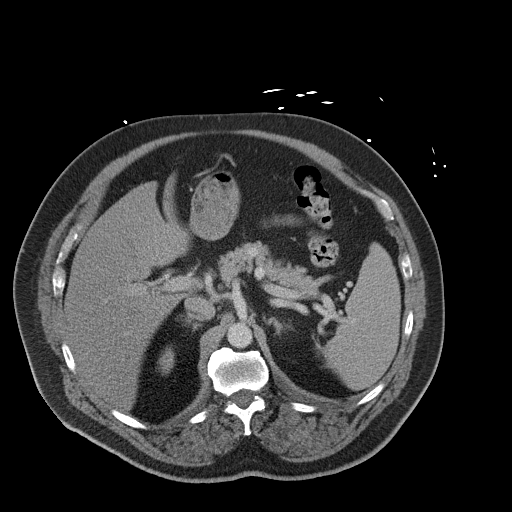
[im 88/103  soft-tissue]
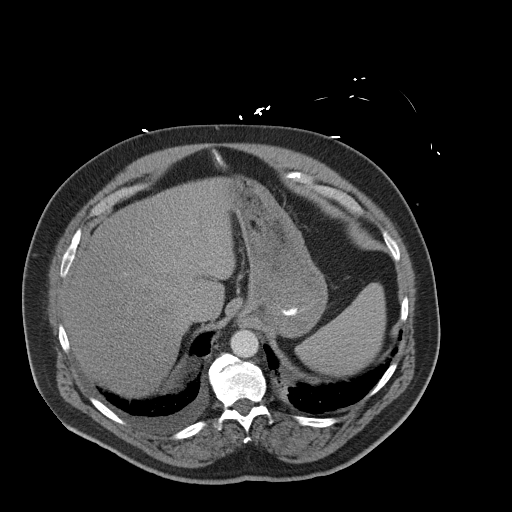
[im 95/103  soft-tissue]
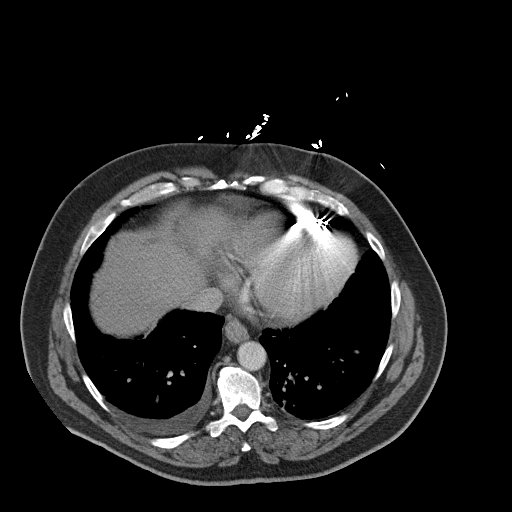

[Series 4: coronal st · coronal · 0.96mm/px · 3 of 206 slices shown]
[im 69/206  soft-tissue]
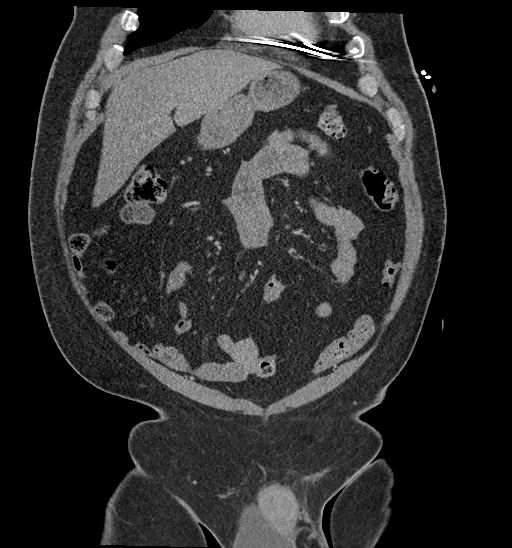
[im 92/206  soft-tissue]
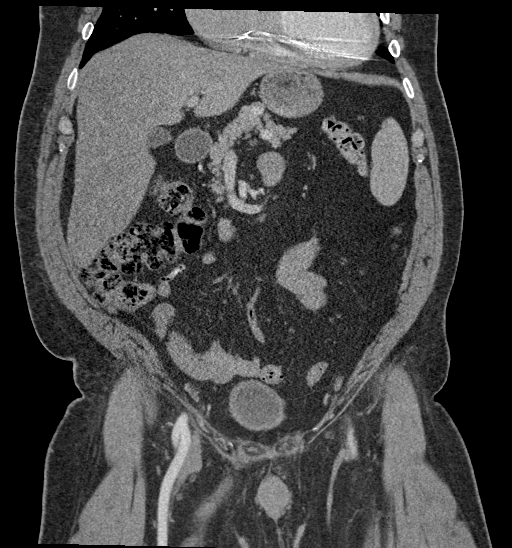
[im 114/206  soft-tissue]
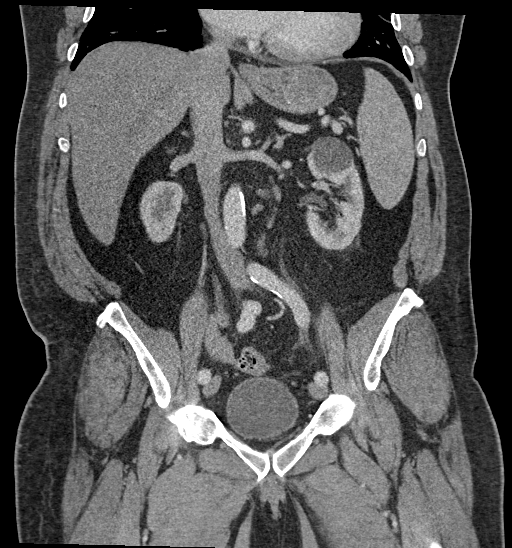

[15 of 46 positions shown; findings below may reference images not displayed]

RADIATION DOSE REDUCTION: This exam was performed according to the
departmental dose-optimization program which includes automated
exposure control, adjustment of the mA and/or kV according to
patient size and/or use of iterative reconstruction technique.

CONTRAST:  100mL OMNIPAQUE IOHEXOL 300 MG/ML  SOLN
FINDINGS: Lower chest: Trace bilateral, right greater than left, pleural
effusion. Bilateral subsegmental atelectasis. Partially visualized
cardiac defibrillator with tip possibly within the myocardium
([DATE]).-limited evaluation due to motion artifact.

Hepatobiliary: The liver is enlarged measuring up 20 cm. Total of 3
subcentimeter hypodensities are noted within the right hepatic lobe
and are too small to characterize. Otherwise no focal liver
abnormality. No gallstones, gallbladder wall thickening, or
pericholecystic fluid. No biliary dilatation.

Pancreas: No focal lesion. Normal pancreatic contour. No surrounding
inflammatory changes. No main pancreatic ductal dilatation.

Spleen: Normal in size without focal abnormality.

Adrenals/Urinary Tract:

No adrenal nodule bilaterally.

Bilateral kidneys enhance symmetrically. There is a 3.9 cm fluid
dense lesion within left kidney that likely represents a simple
renal cyst. There is a 1.2 cm fluid density lesion along the
superior pole of the left kidney that likely represents a simple
renal cyst. There is a 1.3 cm fluid density lesion within the right
kidney that likely represents a simple renal cyst.

No hydronephrosis. No left hydroureter. Focal dilatation of the
distal right ureter at the level of the ureterovesicular junction
which could represent a ureterocele.

The urinary bladder is unremarkable.

On delayed imaging, there is no excretion of intravenous contrast
from either kidneys.

Stomach/Bowel: Stomach is within normal limits. No evidence of bowel
wall thickening or dilatation. Appendix appears normal.

Vascular/Lymphatic: No abdominal aorta or iliac aneurysm. Mild
atherosclerotic plaque of the aorta and its branches. No abdominal,
pelvic, or inguinal lymphadenopathy.

Reproductive: Prostate is unremarkable.

Other: No intraperitoneal free fluid. No intraperitoneal free gas.
No organized fluid collection.

Musculoskeletal:

Mild subcutaneus soft tissue edema surrounding a tiny fat containing
umbilical hernia.

No suspicious lytic or blastic osseous lesions. No acute displaced
fracture. Multilevel degenerative changes of the spine.
IMPRESSION: 1. Focal dilatation of the distal right ureter at the level of the
ureterovesicular junction which could represent a ureterocele.
Limited evaluation of the ureteral lumen with underlying mass lesion
not excluded. Recommend ultrasound for further evaluation.
2. Hepatomegaly.
3. Trace bilateral, right greater than left, pleural effusion.
4. Partially visualized cardiac defibrillator with tip possibly
within the myocardium-limited evaluation due to motion artifact.
5.  Aortic Atherosclerosis ([VG]-[VG]).

## 2021-07-07 IMAGING — DX DG CHEST 1V PORT
2 series · 2 of 2 positions shown · non-contrast
Comparison: [DATE]

CLINICAL DATA: Shortness of breath, cough

EXAM:
PORTABLE CHEST 1 VIEW

[chest ap (1 of 2)]
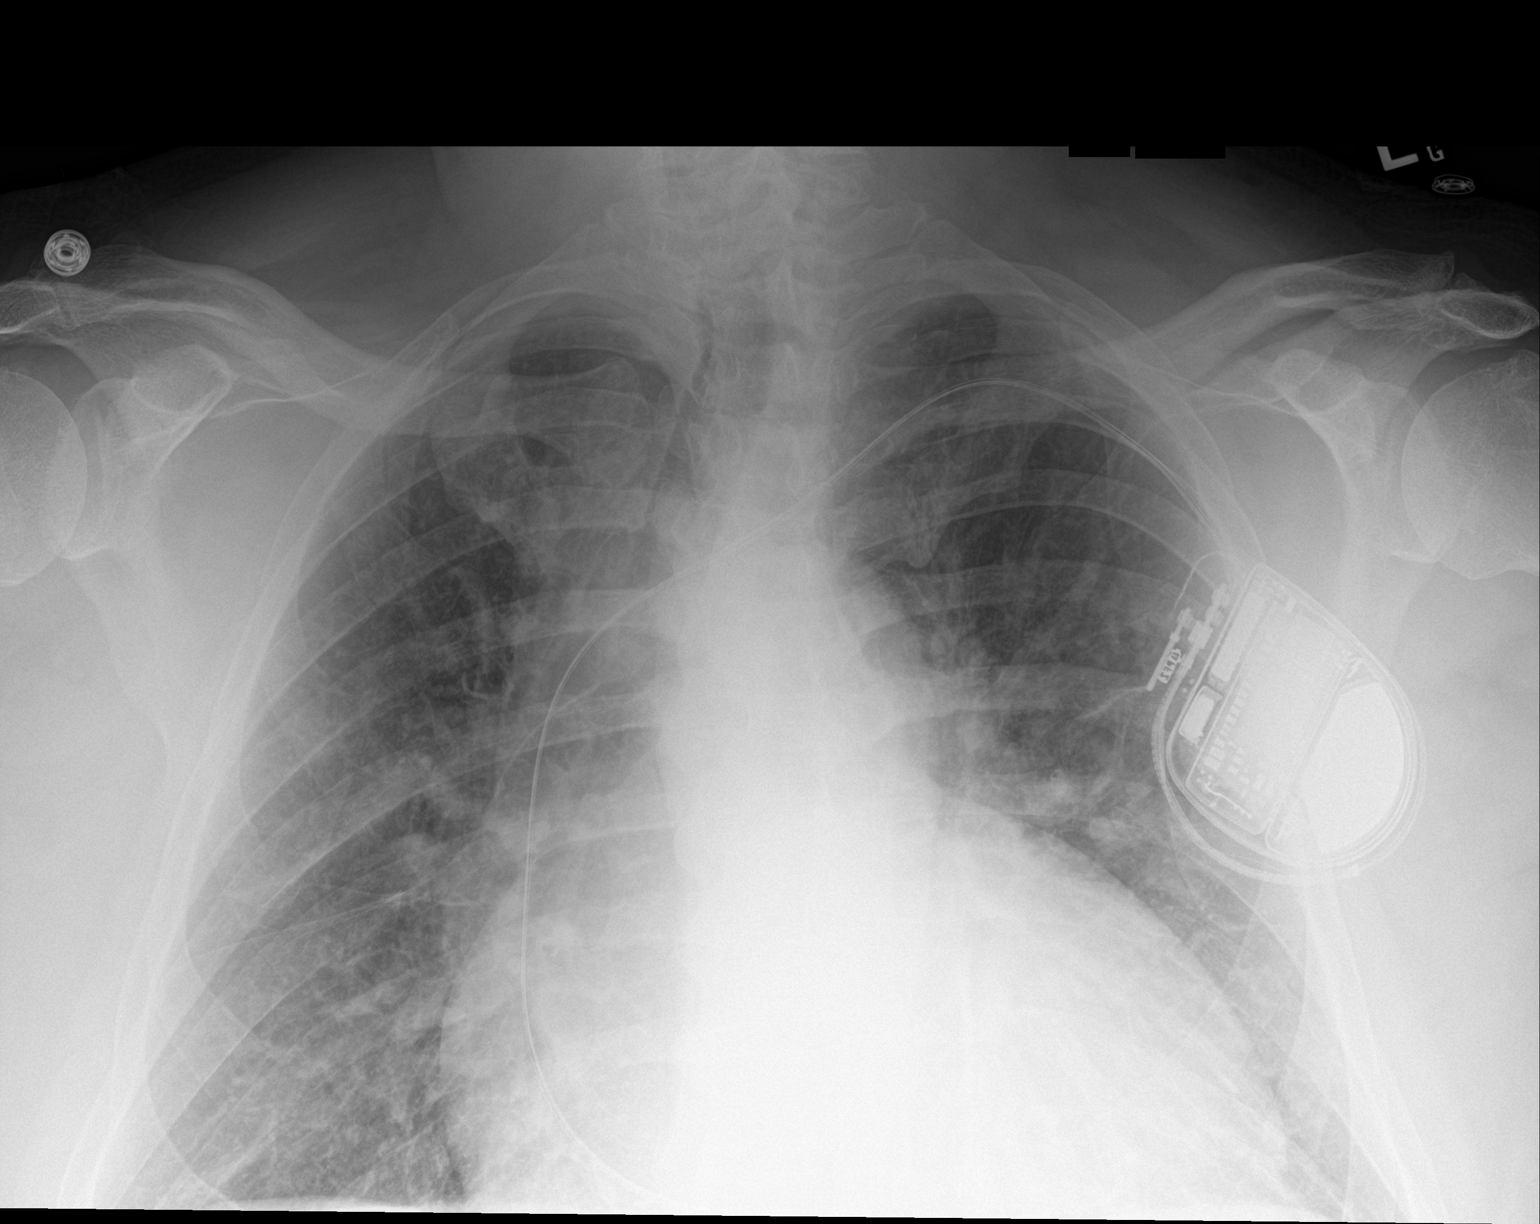

[chest ap (2 of 2)]
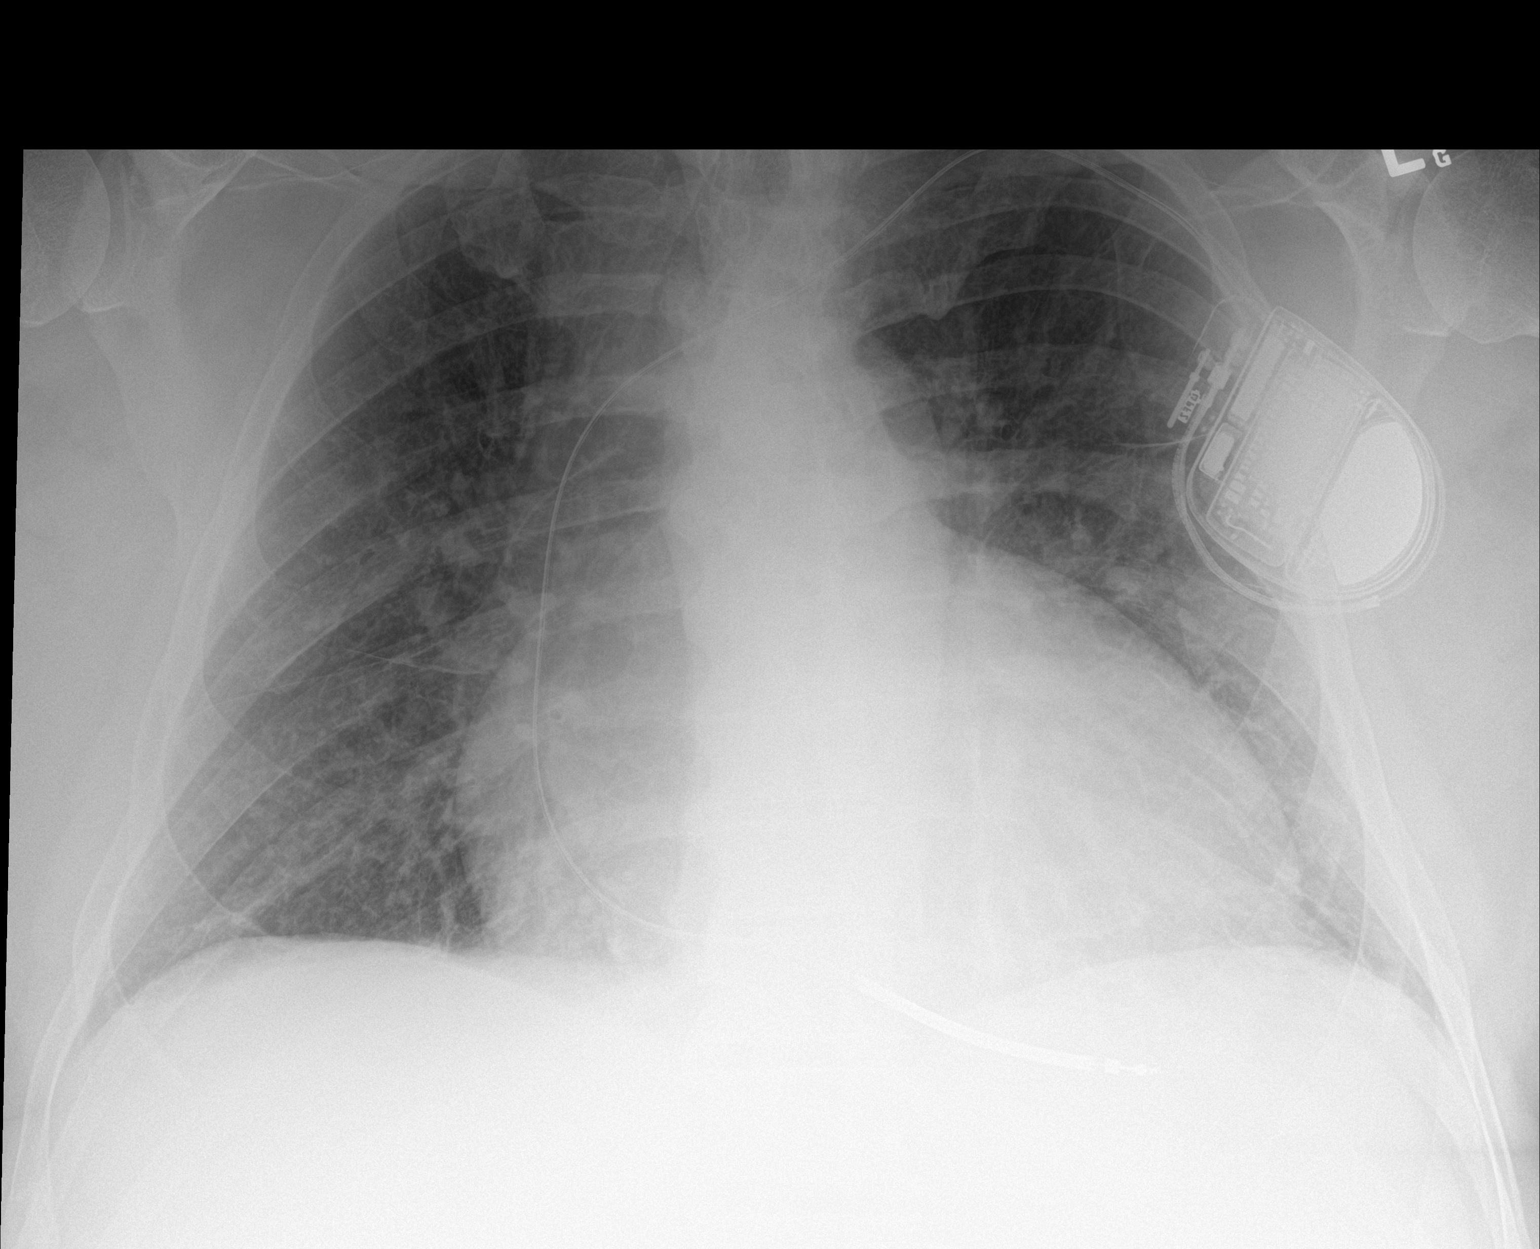

[2 of 2 positions shown; findings below may reference images not displayed]

FINDINGS: Cardiomegaly with left chest single lead pacer defibrillator. Both
lungs are clear. The visualized skeletal structures are
unremarkable.
IMPRESSION: Cardiomegaly without acute abnormality of the lungs in AP portable
projection.

## 2021-07-07 MED ORDER — SODIUM CHLORIDE (PF) 0.9 % IJ SOLN
INTRAMUSCULAR | Status: AC
  Filled 2021-07-07: qty 50

## 2021-07-07 MED ORDER — LOSARTAN POTASSIUM 25 MG PO TABS
50.0000 mg | ORAL_TABLET | Freq: Every day | ORAL | Status: DC
  Administered 2021-07-07: 50 mg via ORAL
  Filled 2021-07-07: qty 2

## 2021-07-07 MED ORDER — IOHEXOL 300 MG/ML  SOLN
100.0000 mL | Freq: Once | INTRAMUSCULAR | Status: AC | PRN
  Administered 2021-07-07: 100 mL via INTRAVENOUS

## 2021-07-07 MED ORDER — CARVEDILOL 3.125 MG PO TABS
6.2500 mg | ORAL_TABLET | Freq: Two times a day (BID) | ORAL | Status: DC
  Administered 2021-07-07: 6.25 mg via ORAL
  Filled 2021-07-07: qty 2

## 2021-07-07 MED ORDER — FUROSEMIDE 10 MG/ML IJ SOLN
40.0000 mg | Freq: Once | INTRAMUSCULAR | Status: AC
  Administered 2021-07-07: 40 mg via INTRAVENOUS
  Filled 2021-07-07: qty 4

## 2021-07-07 NOTE — ED Triage Notes (Signed)
Pt arrived via POV, c/o abd pressure while ambulating. States he has been seen by PCP, told it could be a result of GERD, given meds with no relief. Denies any n/v or diarrhea.  ?

## 2021-07-07 NOTE — Discharge Instructions (Addendum)
For the next 7 days I would advise that you increase your Lasix dose from 20 mg to 40 mg daily.  It is extremely important that you take all of your medications at home as prescribed, particularly because of your advanced heart failure.  I recommend that you call your cardiology office to schedule follow-up appointment. ? ?You should also pay attention to your diet & eat low-salt foods.   ?

## 2021-07-07 NOTE — ED Provider Notes (Signed)
?[facility] DEPT ?Provider Note ? ? ?CSN: [id number] ?Arrival date & time: 07/07/21  1444 ? ?  ? ?History ? ?Chief Complaint  ?Patient presents with  ? Abdominal Pain  ? ? ?Matthew Barnes is a 66 y.o. male with a history of significant CHF (EF 20-25% Jan 2023), obesity, hypertension, diabetes, presenting emergency department with abdominal distention and shortness of breath.  The patient is somewhat of a poor historian, but reports he has been progressively more short of breath for the past 3 to 4 weeks.  He describes orthopnea and says that his abdomen feels much more distended than normal.  He has a poor appetite.  He is having some difficulty with bowel movements but denies specific or ongoing abdominal pain.  He denies chest pain or pressure.  He denies history of smoking or underlying pulmonary disease.  He reports that he takes Lasix 20 mg a day and is compliant with it.  He cannot name the rest of his medications ? 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  He underwent a left heart catheterization May 2022: ? ?"SUMMARY ?Angiographically normal coronary arteries. ?Moderate Secondary Pulmonary Hypertension with PAP-mean of 51/24 mmHg - 37 mmHg; PCWP 30-32 mmHg with LVEDP of 30-35 mmHg. ?In setting of known systolic heart failure this to be consistent with COMBINED SYSTOLIC AND DIASTOLIC HEART FAILURE ? ?Last echo Jan 2023: ? 1. Left ventricular ejection fraction, by estimation, is 20 to 25%. The  ?left ventricle has severely decreased function. The left ventricle  ?demonstrates global hypokinesis. The left ventricular internal cavity size  ?was severely dilated. Left ventricular  ?diastolic parameters are consistent with Grade II diastolic dysfunction  ?(pseudonormalization). Elevated left ventricular end-diastolic  pressure.  ? 2. Right ventricular systolic function is normal. The right ventricular  ?size is normal. There is severely elevated pulmonary artery systolic  ?pressure.  ? 3. Left atrial size was moderately dilated.  ? 4. Right atrial size was mildly dilated.  ? 5. The mitral valve is abnormal. Mild to moderate mitral valve  ?regurgitation. No evidence of mitral stenosis.  ? 6. The aortic valve was not well visualized. There is mild calcification  ?of the aortic valve. Aortic valve regurgitation is mild. Aortic valve  ?sclerosis is present, with no evidence of aortic valve stenosis.  ? 7. Seems to be a lot of turbulence in RVOT . Pulmonic valve regurgitation  ?is moderate.  ? 8. The inferior vena cava is normal in size with greater than 50%  ?respiratory variability, suggesting right atrial pressure of 3 mmHg.  ? ?HPI ? ?  ? ?Home Medications ?Prior to Admission medications   ?Medication Sig Start Date End Date Taking? Authorizing Provider  ?Accu-Chek Softclix Lancets lancets Check blood sugar 2 times daily 12/19/20   Elayne Snare, MD  ?atorvastatin (LIPITOR) 20 MG tablet TAKE 1 TABLET BY MOUTH EVERY DAY 01/16/21   Caren Macadam, MD  ?Blood Glucose Monitoring Suppl (ACCU-CHEK GUIDE) w/Device KIT Check blood sugar 2 times daily 12/19/20   Elayne Snare, MD  ?Blood Pressure Monitoring (BLOOD PRESSURE CUFF) MISC Use as directed 09/02/18   Lucretia Kern, DO  ?carvedilol (COREG) 6.25 MG tablet Take 12.5 mg by mouth 2 (two) times daily with a meal.    [provider]  ?cholecalciferol (VITAMIN D3) 25 MCG (1000 UNIT) tablet Take 1,000  Units by mouth daily.    [provider]  ?digoxin (LANOXIN) 0.125 MG tablet TAKE 1 TABLET BY MOUTH DAILY 01/16/21   Caren Macadam, MD  ?furosemide (LASIX) 20 MG tablet Take 1 tablet (20 mg total) by mouth daily. 01/04/21   Caren Macadam, MD  ?glucose blood (ACCU-CHEK GUIDE) test strip Use to check blood sugar 2 times daily 12/19/20   Elayne Snare, MD  ?glucose blood  (ONETOUCH VERIO) test strip USE AS INSTRUCTED TO CHECK BLOOD SUGAR 2 TIMES A DAY. 12/13/20   Elayne Snare, MD  ?Insulin Syringe-Needle U-100 (INSULIN SYRINGE 1CC/31GX5/16") 31G X 5/16" 1 ML MISC Use 3 needles per day 11/01/20   Elayne Snare, MD  ?lithium carbonate 300 MG capsule Take 3 capsules (900 mg total) by mouth daily. ?Patient taking differently: Take 300-600 mg by mouth See admin instructions. 300 mg midday, 600 mg at bedtime 04/06/19   Koberlein, Steele Berg, MD  ?losartan (COZAAR) 50 MG tablet Take 1 tablet (50 mg total) by mouth daily. 01/10/21   Koberlein, Steele Berg, MD  ?NOVOLIN 70/30 (70-30) 100 UNIT/ML injection INJECT 40 UNITS INTO THE SKIN 2 (TWO) TIMES DAILY WITH A MEAL. ?Patient taking differently: Inject 40 Units into the skin 2 (two) times daily with a meal. 50 units am and 40units at dinner 05/30/21   Elayne Snare, MD  ?pantoprazole (PROTONIX) 20 MG tablet TAKE 1 TABLET BY MOUTH TWICE A DAY BEFORE MEALS 05/01/21   Koberlein, Steele Berg, MD  ?QUEtiapine (SEROQUEL) 200 MG tablet Take 450 mg by mouth at bedtime.    [provider]  ?Semaglutide,0.25 or 0.5MG/DOS, (OZEMPIC, 0.25 OR 0.5 MG/DOSE,) 2 MG/1.5ML SOPN Inject 0.5 mg into the skin once a week. Start with 0.25 mg weekly for 2 weeks then inject 0.5 mg weekly 04/21/21   Elayne Snare, MD  ?sildenafil (VIAGRA) 50 MG tablet Take 1 tablet (50 mg total) by mouth daily as needed for erectile dysfunction. Do not take more then once in 24 hours. 04/28/17   Lucretia Kern, DO  ?   ? ?Allergies    ?Influenza vaccines   ? ?Review of Systems   ?Review of Systems ? ?Physical Exam ?Updated Vital Signs ?BP (!) 175/109   Pulse (!) 109   Temp 98.2 ?F (36.8 ?C) (Oral)   Resp 15   SpO2 96%  ?Physical Exam ?Constitutional:   ?   General: He is not in acute distress. ?HENT:  ?   Head: Normocephalic and atraumatic.  ?Eyes:  ?   Conjunctiva/sclera: Conjunctivae normal.  ?   Pupils: Pupils are equal, round, and reactive to light.  ?Cardiovascular:  ?   Rate and Rhythm: Normal  rate and regular rhythm.  ?Pulmonary:  ?   Effort: Pulmonary effort is normal. No respiratory distress.  ?   Comments: Mildly tachypneic, fine crackles in lower pulmonary exam ?Abdominal:  ?   Tenderness: There is no abdominal tenderness. There is no guarding or rebound.  ?   Comments: Abdomen is soft but appears distended, with fluid wave  ?Skin: ?   General: Skin is warm and dry.  ?Neurological:  ?   General: No focal deficit present.  ?   Mental Status: He is alert. Mental status is at baseline.  ?Psychiatric:     ?   Mood and Affect: Mood normal.     ?   Behavior: Behavior normal.  ? ? ?ED Results / Procedures / Treatments   ?Labs ?(all labs ordered are listed, but  only abnormal results are displayed) ?Labs Reviewed  ?COMPREHENSIVE METABOLIC PANEL - Abnormal; Notable for the following components:  ?    Result Value  ? Glucose, Bld 180 (*)   ? Creatinine, Ser 1.30 (*)   ? All other components within normal limits  ?CBC WITH DIFFERENTIAL/PLATELET - Abnormal; Notable for the following components:  ? Hemoglobin 12.6 (*)   ? Platelets 132 (*)   ? All other components within normal limits  ?BRAIN NATRIURETIC PEPTIDE - Abnormal; Notable for the following components:  ? B Natriuretic Peptide 222.6 (*)   ? All other components within normal limits  ?TROPONIN I (HIGH SENSITIVITY) - Abnormal; Notable for the following components:  ? Troponin I (High Sensitivity) 34 (*)   ? All other components within normal limits  ?TROPONIN I (HIGH SENSITIVITY) - Abnormal; Notable for the following components:  ? Troponin I (High Sensitivity) 35 (*)   ? All other components within normal limits  ?RESP PANEL BY RT-PCR (FLU A&B, COVID) ARPGX2  ?LIPASE, BLOOD  ?PROTIME-INR  ?URINALYSIS, ROUTINE W REFLEX MICROSCOPIC  ? ? ?EKG ?EKG Interpretation ? ?Date/Time:  Sunday July 07 2021 16:27:11 EDT ?Ventricular Rate:  105 ?PR Interval:  171 ?QRS Duration: 108 ?QT Interval:  359 ?QTC Calculation: 475 ?R Axis:   -30 ?Text Interpretation: Sinus  tachycardia Probable left atrial enlargement Abnormal R-wave progression, late transition Left ventricular hypertrophy Confirmed by Octaviano Glow [phone]) on 07/07/2021 4:45:26 PM ? ?Radiology ?CT ABDOMEN PELV

## 2021-07-08 NOTE — Progress Notes (Deleted)
?Cardiology Office Note:   ? ?Date:  07/08/2021  ? ?ID:  IBROHIM SIMMERS, DOB 1955/12/14, MRN [id number] ? ?PCP:  Caren Macadam, MD ?  ?[facility] Providers ?Cardiologist:  Quay Burow, MD ?Electrophysiologist:  [name], MD { ?Click to update primary MD,subspecialty MD or APP then REFRESH:1}   ? ?Referring MD: Caren Macadam, MD  ? ?Chief Complaint: *** ? ?History of Present Illness:   ? ?Matthew Barnes is a 66 y.o. male with a hx of chronic combined CHF, non-ischemic cardiomyopathy s/p ICD implant for EF 20% without improvement on maximum GDMT, diabetes, obesity, CKD, hyperlipidemia, and bipolar disorder.  ? ?He initially established care with our group in 2019. He did not have consistent follow-up and then in May 2022 he presented to [facility] with malaise and anxiety and echo revealed severe LV dysfunction with EF 20-25%. It was very poor study due to poor acoustical images and global LV HK and pericardial effusion. He was agitated and required psychiatry involvement. He underwent right and left heart catheterization on 08/07/20 which revealed angiographically normal coronary arteries, moderate secondary pulmonary hypertension, severely elevated LVEDP, no aortic valve stenosis. He returned to our office on 04/12/21 reported adherence to medications. Beta blocker was up-titrated  and repeat echo revealed LVEF remained reduced at 20-25%, G2 DD.  ? ?He was last seen in our office by Dr. Lovena Le on 05/14/21. He was referred by Dr. Marlou Porch for discussion of ICD implant. He was on maximal GDMT for one year with no improvement in EF. ICD implanted 05/20/21 by Dr. Lovena Le ?He presented to [facility] ED on 07/07/21 with abdominal distention and shortness of breath for the past 3-4 weeks.  ? ?Today, he is here  ? ?Past Medical History:  ?Diagnosis Date  ? Bipolar II disorder - Managed by Marye Round [phone]) 05/03/2013  ? Diabetes mellitus   ? Dysphagia   ? Hyperlipidemia   ? Hypertension   ? Unspecified  hypothyroidism 05/03/2013  ? ? ?Past Surgical History:  ?Procedure Laterality Date  ? ICD IMPLANT N/A 05/20/2021  ? Procedure: ICD IMPLANT;  Surgeon: Evans Lance, MD;  Location: [facility];  Service: Cardiovascular;  Laterality: N/A;  ? KNEE SURGERY    ? scope on left knee  ? RIGHT/LEFT HEART CATH AND CORONARY ANGIOGRAPHY N/A 08/07/2020  ? Procedure: RIGHT/LEFT HEART CATH AND CORONARY ANGIOGRAPHY;  Surgeon: Leonie Man, MD;  Location: [facility];  Service: Cardiovascular;  Laterality: N/A;  ? ? ?Current Medications: ?No outpatient medications have been marked as taking for the 07/15/21 encounter (Appointment) with Ann Maki, Lanice Schwab, NP.  ?  ? ?Allergies:   Influenza vaccines  ? ?Social History  ? ?Socioeconomic History  ? Marital status: Single  ?  Spouse name: Not on file  ? Number of children: Not on file  ? Years of education: Not on file  ? Highest education level: Not on file  ?Occupational History  ? Not on file  ?Tobacco Use  ? Smoking status: Never  ? Smokeless tobacco: Never  ?Vaping Use  ? Vaping Use: Never used  ?Substance and Sexual Activity  ? Alcohol use: No  ? Drug use: No  ? Sexual activity: Not Currently  ?Other Topics Concern  ? Not on file  ?Social History Narrative  ? Work or School: on disability for knee arthritis  ?   ? Home Situation: lives alone  ?   ? Spiritual Beliefs:Christian  ?   ? Lifestyle:no regular  exercising; diet not great  ?   ?   ?   ? ?Social Determinants of Health  ? ?Financial Resource Strain: Low Risk   ? Difficulty of Paying Living Expenses: Not hard at all  ?Food Insecurity: No Food Insecurity  ? Worried About Charity fundraiser in the Last Year: Never true  ? Ran Out of Food in the Last Year: Never true  ?Transportation Needs: No Transportation Needs  ? Lack of Transportation (Medical): No  ? Lack of Transportation (Non-Medical): No  ?Physical Activity: Inactive  ? Days of Exercise per Week: 0 days  ? Minutes of Exercise per Session: 0 min  ?Stress:  No Stress Concern Present  ? Feeling of Stress : Not at all  ?Social Connections: Not on file  ?  ? ?Family History: ?The patient's ***family history includes Depression in his father, paternal grandmother, and another family member; Diabetes in his father, mother, and sister; Heart attack (age of onset: 3) in his father; Heart disease in his father and mother; Hypertension in his father and mother. ? ?ROS:   ?Please see the history of present illness.    ?*** All other systems reviewed and are negative. ? ?Labs/Other Studies Reviewed:   ? ?The following studies were reviewed today: ? ?R/LHC 08/07/20 ? ?Hemodynamic findings consistent with moderate pulmonary hypertension. ?LV end diastolic pressure is severely elevated. ?There is no aortic valve stenosis. ?  ?SUMMARY ?Angiographically normal coronary arteries. ?Moderate Secondary Pulmonary Hypertension with PAP-mean of 51/24 mmHg - 37 mmHg; PCWP 30-32 mmHg with LVEDP of 30-35 mmHg. ?In setting of known systolic heart failure this to be consistent with COMBINED SYSTOLIC AND DIASTOLIC HEART FAILURE ? ? ?Echo 04/26/21 ? ? Left Ventricle: Left ventricular ejection fraction, by estimation, is 20  ?to 25%. The left ventricle has severely decreased function. The left  ?ventricle demonstrates global hypokinesis. Definity contrast agent was  ?given IV to delineate the left  ?ventricular endocardial borders. The left ventricular internal cavity size  ?was severely dilated. There is no left ventricular hypertrophy. Left  ?ventricular diastolic parameters are consistent with Grade II diastolic  ?dysfunction (pseudonormalization).  ?Elevated left ventricular end-diastolic pressure.  ?Right Ventricle: The right ventricular size is normal. No increase in  ?right ventricular wall thickness. Right ventricular systolic function is  ?normal. There is severely elevated pulmonary artery systolic pressure. The  ?tricuspid regurgitant velocity is  ?3.65 m/s, and with an assumed right  atrial pressure of 8 mmHg, the  ?estimated right ventricular systolic pressure is 57.8 mmHg.  ?Left Atrium: Left atrial size was moderately dilated.  ?Right Atrium: Right atrial size was mildly dilated.  ?Pericardium: There is no evidence of pericardial effusion.  ?Mitral Valve: The mitral valve is abnormal. There is mild thickening of  ?the mitral valve leaflet(s). Mild to moderate mitral valve regurgitation.  ?No evidence of mitral valve stenosis.  ?Tricuspid Valve: The tricuspid valve is normal in structure. Tricuspid  ?valve regurgitation is not demonstrated. No evidence of tricuspid  ?stenosis.  ?Aortic Valve: The aortic valve was not well visualized. There is mild  ?calcification of the aortic valve. Aortic valve regurgitation is mild.  ?Aortic valve sclerosis is present, with no evidence of aortic valve  ?stenosis.  ?Pulmonic Valve: Seems to be a lot of turbulence in RVOT. The pulmonic  ?valve was normal in structure. Pulmonic valve regurgitation is moderate.  ?No evidence of pulmonic stenosis.  ?Aorta: The aortic root is normal in size and structure.  ?Venous: The inferior vena  cava is normal in size with greater than 50%  ?respiratory variability, suggesting right atrial pressure of 3 mmHg.  ?IAS/Shunts: No atrial level shunt detected by color flow Doppler.  ? ?Recent Labs: ?08/05/2020: Magnesium 2.2 ?02/28/2021: Pro B Natriuretic peptide (BNP) 93.0 ?06/18/2021: TSH 2.39 ?07/07/2021: ALT 20; B Natriuretic Peptide 222.6; BUN 16; Creatinine, Ser 1.30; Hemoglobin 12.6; Platelets 132; Potassium 3.9; Sodium 142  ?Recent Lipid Panel ?   ?Component Value Date/Time  ? CHOL 116 07/31/2020 0409  ? TRIG 65 07/31/2020 0409  ? HDL 24 (L) 07/31/2020 0409  ? CHOLHDL 4.8 07/31/2020 0409  ? VLDL 13 07/31/2020 0409  ? [place] 79 07/31/2020 0409  ? LDLDIRECT 73.0 09/01/2017 1054  ? ? ? ?Risk Assessment/Calculations:   ?{Does this patient have ATRIAL FIBRILLATION?:[phone]} ? ?    ? ?Physical Exam:   ? ?VS:  There were no vitals  taken for this visit.   ? ?Wt Readings from Last 3 Encounters:  ?06/21/21 (!) 309 lb (140.2 kg)  ?05/20/21 300 lb (136.1 kg)  ?05/14/21 (!) 308 lb (139.7 kg)  ?  ? ?GEN: *** Well nourished, well developed in no acute

## 2021-07-09 ENCOUNTER — Telehealth: Payer: Self-pay | Admitting: Pharmacist

## 2021-07-09 NOTE — Chronic Care Management (AMB) (Signed)
? ? ?  Chronic Care Management ?Pharmacy Assistant  ? ?Name: Matthew Barnes  MRN: [id number] DOB: Oct 18, 1955 ? ?07/10/2021 APPOINTMENT REMINDER ? ? ?Called [name], No answer, left message of appointment on 07/10/2021 at 10:30 via telephone visit with Jeni Salles, Pharm D. Notified to have all medications, supplements, blood pressure and/or blood sugar logs available during appointment and to return call if need to reschedule. ? ?Care Gaps: ?AWV scheduled for 03/10/2022 ?Last BP - 140/85 on 06/21/2021 ?Last A1C - 7.5 on 04/09/2021 ?Pneumonia vaccine - overdue ?Cologuard - overdue ?Covid booster - overdue ?Foot exam - overdue ?Eye exam - overdue ?  ?Star Rating Drugs: ?Atorvastatin 20 mg - last [facility]/20/2021 [facility] ?Losartan 50 mg - last filled [facility]/05/2021 [facility] ?Ozempic 0.5 mg - last filled 06/03/2021 [facility] ? ? ?Any gaps in medications fill history? ? ?Gennie Alma CMA  ?Clinical Pharmacist Assistant ?[phone] ? ?

## 2021-07-10 ENCOUNTER — Ambulatory Visit (INDEPENDENT_AMBULATORY_CARE_PROVIDER_SITE_OTHER): Payer: Medicare HMO | Admitting: Pharmacist

## 2021-07-10 DIAGNOSIS — M546 Pain in thoracic spine: Secondary | ICD-10-CM | POA: Diagnosis not present

## 2021-07-10 DIAGNOSIS — E1140 Type 2 diabetes mellitus with diabetic neuropathy, unspecified: Secondary | ICD-10-CM

## 2021-07-10 DIAGNOSIS — M9901 Segmental and somatic dysfunction of cervical region: Secondary | ICD-10-CM | POA: Diagnosis not present

## 2021-07-10 DIAGNOSIS — I10 Essential (primary) hypertension: Secondary | ICD-10-CM

## 2021-07-10 DIAGNOSIS — M62830 Muscle spasm of back: Secondary | ICD-10-CM | POA: Diagnosis not present

## 2021-07-10 DIAGNOSIS — M9902 Segmental and somatic dysfunction of thoracic region: Secondary | ICD-10-CM | POA: Diagnosis not present

## 2021-07-10 NOTE — Progress Notes (Signed)
? ?Chronic Care Management ?Pharmacy Note ? ?07/12/2021 ?Name:  Matthew Barnes MRN:  [id number] DOB:  November 03, 1955 ? ?Summary: ?A1c not at goal < 7% ?BP not at goal < 130/80 ? ?Recommendations/Changes made from today's visit: ?-Recommended routine BP and weight monitoring at home ?-Recommended switching pantoprazole to omeprazole 20 mg daily ?-Recommended prescribing CGM for blood sugar monitoring ? ?Plan: ?Pt will purchase a BP cuff ?Follow up BP and DM assessment in 1 month ?PAP for Ozempic ? ? ?Subjective: ?Matthew Barnes is an 66 y.o. year old male who is a primary patient of Koberlein, Steele Berg, MD.  The CCM team was consulted for assistance with disease management and care coordination needs.   ? ?Engaged with patient by telephone for follow up visit in response to provider referral for pharmacy case management and/or care coordination services.  ? ?Consent to Services:  ?The patient was given information about Chronic Care Management services, agreed to services, and gave verbal consent prior to initiation of services.  Please see initial visit note for detailed documentation.  ? ?Patient Care Team: ?Caren Macadam, MD as PCP - General (Family Medicine) ?Lorretta Harp, MD as PCP - Cardiology (Cardiology) ?Evans Lance, MD as PCP - Electrophysiology (Cardiology) ?Adegoroye, Wynona Luna, MD (Specialist) ?Elayne Snare, MD as Consulting Physician (Endocrinology) ?Viona Gilmore, [facility] as Pharmacist (Pharmacist) ? ?Recent office visits: ?02/28/2021 Grier Mitts MD - Patient was seen for a cough and additional issue. Started Augmenting. Benzonatate and Flonase. Follow up in 1 week.  ?  ?02/25/2021 Glenna Durand LPN - Medicare annual wellness exam.  ? ?01/30/21 Carolann Littler, MD: Patient presented for cough. Prescribed prednisone and albuterol inhaler. ? ?01/04/2021 Micheline Rough MD - Patient was seen for Hypertension associated with diabetes and additional issues. Increased Losartan from 25 mg daily to 50  mg daily. Discontinued Empagliflozin and Thiamine. Recommended restarting Lasix. Placed referral for cardiology. Follow up in  3 months.  ? ?Recent consult visits: ?06/21/2021 Elayne Snare MD (endocrinology) - Patient was seen for Uncontrolled type 2 diabetes mellitus. Increased Ozempic to 1 mg for additional A1c lowering and weight loss. Increased mixed insulin to 60 units. ? ?05/14/2021 [name] MD (cardiology) - Patient was seen for chronic systolic heart failure. Decreased Furosemide to 20 mg daily. Discontinued Jardiance. No follow up noted.  ?  ?04/19/2021 Elayne Snare MD (endocrinology) - Patient was seen for Uncontrolled type 2 diabetes mellitus with hyperglycemia, with long-term current use of insulin and additional issues. Changed Semaglutide to 0.5 mg weekly. Follow up in 2 months.  ?  ?04/12/2021 Candee Furbish MD (cardiology) - Patient was seen for chronic systolic heart failure and additional issues. Discontinued Benzonatate and pantoprazole. Follow up in 3 months.  ?  ?03/14/2021 Masoud Hijazi (psychiatry) - Patient was seen for bipolar disorder, current episode depressed, moderate.  ?  ?03/08/2021 Ermalene Searing (chropractor) - Patient was seen for Segmental and somatic dysfunction of cervical region and additional issues.  ?  ? ?Hospital visits: ?Medication Reconciliation was completed by comparing discharge summary, patient?s EMR and Pharmacy list, and upon discussion with patient. ?  ?Patient was seen at [facility] ED on 07/07/2021 due to abdominal pain. Time spent in ED was 4 hours.   ?  ?New?Medications Started at [facility] Discharge:?? ?None ?Medication Changes at Hospital Discharge: ?None ?Medications Discontinued at Hospital Discharge: ?None  ?-All other medications will remain the same.  ? ?Medication Reconciliation was completed by comparing discharge summary, patient?s EMR and Pharmacy  list, and upon discussion with patient. ?  ?Patient was seen at [facility] on 05/20/2021 due to ICD implant. Time spent admitted was 7 hours.   ?  ?New?Medications Started at [facility] Discharge:?? ?None ?Medication Changes at Hospital Discharge: ?None ?Medications Discontinued at Hospital Discharge: ?None  ?-All other medications will remain the same.   ?  ?Objective: ? ?Lab Results  ?Component Value Date  ? CREATININE 1.30 (H) 07/07/2021  ? BUN 16 07/07/2021  ? GFR 55.19 (L) 06/18/2021  ? GFRNONAA >60 07/07/2021  ? GFRAA >90 04/18/2011  ? NA 142 07/07/2021  ? K 3.9 07/07/2021  ? CALCIUM 8.9 07/07/2021  ? CO2 26 07/07/2021  ? GLUCOSE 180 (H) 07/07/2021  ? ? ?Lab Results  ?Component Value Date/Time  ? HGBA1C 7.5 (H) 04/09/2021 10:55 AM  ? HGBA1C 7.3 (A) 11/01/2020 01:10 PM  ? HGBA1C 8.3 (H) 07/31/2020 04:09 AM  ? FRUCTOSAMINE 301 (H) 06/18/2021 11:18 AM  ? FRUCTOSAMINE 331 (H) 12/11/2020 10:30 AM  ? GFR 55.19 (L) 06/18/2021 11:18 AM  ? GFR 40.47 (L) 04/09/2021 10:55 AM  ? MICROALBUR 1.9 02/28/2021 08:52 AM  ? MICROALBUR 1.1 07/14/2019 03:14 PM  ?  ?Last diabetic Eye exam:  ?Lab Results  ?Component Value Date/Time  ? HMDIABEYEEXA No Retinopathy 01/11/2019 12:00 AM  ?  ?Last diabetic Foot exam: No results found for: HMDIABFOOTEX  ? ?Lab Results  ?Component Value Date  ? CHOL 116 07/31/2020  ? HDL 24 (L) 07/31/2020  ? [place] 79 07/31/2020  ? LDLDIRECT 73.0 09/01/2017  ? TRIG 65 07/31/2020  ? CHOLHDL 4.8 07/31/2020  ? ? ? ?  Latest Ref Rng & Units 07/07/2021  ?  4:03 PM 02/28/2021  ?  8:52 AM 10/02/2020  ?  5:57 PM  ?Hepatic Function  ?Total Protein 6.5 - 8.1 g/dL 6.8   6.7   6.9    ?Albumin 3.5 - 5.0 g/dL 4.0   4.0   4.1    ?AST 15 - 41 U/L _0 ?ALT 0 - 44 U/L _1 ?Alk Phosphatase 38 - 126 U/L 50   51   39    ?Total Bilirubin 0.3 - 1.2 mg/dL 0.6   0.7   0.7    ?Bilirubin, Direct 0.0 - 0.2 mg/dL   0.2    ? ? ?Lab Results  ?Component Value Date/Time  ? TSH 2.39 06/18/2021 02:11 PM  ? TSH 1.28 02/28/2021 08:52 AM  ? FREET4 0.83 06/18/2021 02:11 PM  ? FREET4 0.79  05/21/2020 10:57 AM  ? ? ? ?  Latest Ref Rng & Units 07/07/2021  ?  4:03 PM 05/14/2021  ?  2:24 PM 02/28/2021  ?  8:52 AM  ?CBC  ?WBC 4.0 - 10.5 K/uL 6.2   6.2   5.4    ?Hemoglobin 13.0 - 17.0 g/dL 12.6   13.1   12.7    ?Hematocrit 39.0 - 52.0 % 39.3   40.7   39.8    ?Platelets 150 - 400 K/uL 132   133   116.0    ? ? ?Lab Results  ?Component Value Date/Time  ? VD25OH 43.46 02/28/2021 08:52 AM  ? ? ?Clinical ASCVD: No  ?The ASCVD Risk score (Arnett DK, et al., 2019) failed to calculate for the following reasons: ?  The valid total cholesterol range is 130 to 320 mg/dL   ? ? ?  02/25/2021  ?  11:21 AM 10/05/2020  ?  2:30 PM 02/15/2020  ? 11:09 AM  ?Depression screen PHQ 2/9  ?Decreased Interest 0 0 0  ?Down, Depressed, Hopeless 0 0 0  ?PHQ - 2 Score 0 0 0  ?Altered sleeping  0   ?Tired, decreased energy  0   ?Change in appetite  0   ?Feeling bad or failure about yourself   0   ?Trouble concentrating  0   ?Moving slowly or fidgety/restless  0   ?Suicidal thoughts  0   ?PHQ-9 Score  0   ?  ? ?Social History  ? ?Tobacco Use  ?Smoking Status Never  ?Smokeless Tobacco Never  ? ?BP Readings from Last 3 Encounters:  ?07/07/21 (!) 175/109  ?06/21/21 140/85  ?05/20/21 (!) 176/101  ? ?Pulse Readings from Last 3 Encounters:  ?07/07/21 (!) 109  ?06/21/21 85  ?05/20/21 93  ? ?Wt Readings from Last 3 Encounters:  ?06/21/21 (!) 309 lb (140.2 kg)  ?05/20/21 300 lb (136.1 kg)  ?05/14/21 (!) 308 lb (139.7 kg)  ? ?BMI Readings from Last 3 Encounters:  ?06/21/21 40.77 kg/m?  ?05/20/21 39.58 kg/m?  ?05/14/21 40.64 kg/m?  ? ? ?Assessment/Interventions: Review of patient past medical history, allergies, medications, health status, including review of consultants reports, laboratory and other test data, was performed as part of comprehensive evaluation and provision of chronic care management services.  ? ?SDOH:  (Social Determinants of Health) assessments and interventions performed: No ? ? ?SDOH Screenings  ? ?Alcohol Screen: Not on file   ?Depression (PHQ2-9): Low Risk   ? PHQ-2 Score: 0  ?Financial Resource Strain: Low Risk   ? Difficulty of Paying Living Expenses: Not hard at all  ?Food Insecurity: No Food Insecurity  ? Worried About [employer]

## 2021-07-12 ENCOUNTER — Other Ambulatory Visit: Payer: Self-pay | Admitting: Family Medicine

## 2021-07-12 DIAGNOSIS — K219 Gastro-esophageal reflux disease without esophagitis: Secondary | ICD-10-CM

## 2021-07-12 MED ORDER — OMEPRAZOLE 20 MG PO CPDR: 20.0000 mg | DELAYED_RELEASE_CAPSULE | Freq: Every day | ORAL | 0 refills | Status: DC

## 2021-07-12 NOTE — Patient Instructions (Signed)
Hi Matthew Barnes, ? ?It was great catch up with you again! ? ?Please reach out to me if you have any questions or need anything! ? ?Please be on the lookout for the Ozempic application in the mail.  ? ?Best, ?Matthew Barnes ? ?Matthew Barnes, PharmD, BCACP ?Clinical Pharmacist ?Therapist, music at [place] ?[phone] ? ? Visit Information ? ? Goals Addressed   ?None ?  ? ?Patient Care Plan: Matthew Barnes  ?  ? ?Problem Identified: Problem: Hypertension, Hyperlipidemia, Diabetes, Heart Failure, GERD, Hypothyroidism, and Bipolar disorder   ?  ? ?Long-Range Goal: Patient-Specific Goal   ?Start Date: 02/05/2021  ?Expected End Date: 02/05/2022  ?Recent Progress: On track  ?Priority: High  ?Note:   ?Current Barriers:  ?Unable to independently afford treatment regimen ?Unable to independently monitor therapeutic efficacy ?Unable to achieve control of diabetes  ?Unable to maintain control of blood pressure ?Unable to self administer medications as prescribed ? ?Pharmacist Clinical Goal(s):  ?Patient will verbalize ability to afford treatment regimen ?achieve adherence to monitoring guidelines and medication adherence to achieve therapeutic efficacy ?achieve control of diabetes as evidenced by A1c ?maintain control of blood pressure as evidenced by home and office blood pressure readings  through collaboration with PharmD and provider.  ? ?Interventions: ?1:1 collaboration with Matthew Macadam, MD regarding development and update of comprehensive plan of care as evidenced by provider attestation and co-signature ?Inter-disciplinary care team collaboration (see longitudinal plan of care) ?Comprehensive medication review performed; medication list updated in electronic medical record ? ?Hypertension (BP goal <130/80) ?-Uncontrolled ?-Current treatment: ?Losartan 50 mg 1 tablet daily - Appropriate, Query effective, Safe, Accessible ?-Medications previously tried: amlodipine  ?-Current home readings: does not have a  cuff ?-Current dietary habits: does not pay attention to sodium intake ?-Current exercise habits: unable to with shortness of breath ?-Denies hypotensive/hypertensive symptoms ?-Educated on BP goals and benefits of medications for prevention of heart attack, stroke and kidney damage; ?Daily salt intake goal < 2300 mg; ?Importance of home blood pressure monitoring; ?Proper BP monitoring technique; ?Symptoms of hypotension and importance of maintaining adequate hydration; ?-Counseled to monitor BP at home weekly, document, and provide log at future appointments ?-Counseled on diet and exercise extensively ?Recommended to continue current medication ? ?Hyperlipidemia: (LDL goal < 70) ?-Not ideally controlled ?-Current treatment: ?Atorvastatin 20 mg 1 tablet daily - Appropriate, Query effective, Safe, Accessible ?-Medications previously tried: none  ?-Current dietary patterns: eats out some due to job ?-Current exercise habits: unable to with shortness of breath ?-Educated on Cholesterol goals;  ?Benefits of statin for ASCVD risk reduction; ?-Counseled on diet and exercise extensively ?Recommended to continue current medication ? ?Diabetes (A1c goal <7%) ?-Not ideally controlled ?-Current medications: ?Novolin 70/30 inject 60 units daily in the morning and 50 units in the evening - Appropriate, Query effective, Safe, Accessible ?Ozempic 0.5 mg inject once weekly - Appropriate, Query effective, Safe, Accessible  ?-Medications previously tried: Jardiance (kidney function) ?-Current home glucose readings ?fasting glucose: 120-130; checking 2 times a day (varies with eating) ?post prandial glucose: n/a ?-Denies hypoglycemic/hyperglycemic symptoms ?-Current meal patterns:  ?breakfast: did not discuss in detail  ?lunch: did not discuss in detail  ?dinner: did not discuss in detail  ?snacks: did not discuss in detail  ?drinks: did not discuss in detail  ?-Current exercise: unable to right now ?-Educated on A1c and blood sugar  goals; ?Benefits of routine self-monitoring of blood sugar; ?Continuous glucose monitoring; ?Carbohydrate counting and/or plate method ?-Counseled to check feet daily and get yearly  eye exams ?-Counseled on diet and exercise extensively ?Recommended to continue current medication ?Recommended starting continuous glucose monitoring. ?Assessed patient finances. Patient would qualify for Ozempic PAP. ? ?Heart Failure (Goal: manage symptoms and prevent exacerbations) ?-Uncontrolled ?-Last ejection fraction: 20-25% (Date: 08/03/20) ?-HF type: Left Ventricular Failure ?-NYHA Class: III (marked limitation of activity) ?-AHA HF Stage: C (Heart disease and symptoms present) ?-Current treatment: ?Losartan 50 mg 1 tablet daily - Appropriate, Effective, Safe, Accessible ?Carvedilol 6.25 mg 1 tablet twice daily with a meal - Appropriate, Effective, Safe, Accessible ?Furosemide 20 mg 1 tablet daily - Appropriate, Effective, Safe, Accessible ?Digoxin 0.125 mg 1 tablet daily - Appropriate, Effective, Safe, Accessible ?-Medications previously tried: none  ?-Current home BP/HR readings: not checking ?-Current dietary habits: tries not to add salt (fast food - tries to come home and eat when he can); eats deli meat wants faster food  ?-Current exercise habits: unable to and does not have energy ?-Educated on Benefits of medications for managing symptoms and prolonging life ?Importance of weighing daily; if you gain more than 3 pounds in one day or 5 pounds in one week, call cardiology. ?Importance of blood pressure control ?-Counseled on diet and exercise extensively ?Recommended to continue current medication ? ?Bipolar disorder(Goal: minimize symptoms) ?-Controlled ?-Current treatment: ?Lithium 300 mg 4 capsules daily - Appropriate, Effective, Safe, Accessible ?Quetiapine 200 mg 2.5 tablets at bedtime - Appropriate, Effective, Safe, Accessible ?-Medications previously tried/failed: n/a ?-PHQ9: 0 ?-GAD7: n/a ?-Educated on Benefits of  medication for symptom control ?Benefits of cognitive-behavioral therapy with or without medication ?-Counseled on diet and exercise extensively ?Recommended to continue current medication ? ?GERD (Goal: minimize symptoms) ?-Uncontrolled ?-Current treatment  ?Pantoprazole 20 mg 1 tablet twice daily before meals - Appropriate, Query effective, Safe, Accessible ?-Medications previously tried: omeprazole  ?-Collaborated with PCP to restart omeprazole 20 mg daily. ? ?Health Maintenance ?-Vaccine gaps: shingrix, Prevnar 20, COVID booster ?-Current therapy:  ?Sildenafil 50 mg as needed ?Albuterol HFA as needed ?-Educated on Cost vs benefit of each product must be carefully weighed by individual consumer ?-Patient is satisfied with current therapy and denies issues ?-Recommended to continue current medication ? ?Patient Goals/Self-Care Activities ?Patient will:  ?- take medications as prescribed as evidenced by patient report and record review ?check glucose 3 times daily, document, and provide at future appointments ?check blood pressure weekly, document, and provide at future appointments ? ?Follow Up Plan: The care management team will reach out to the patient again over the next 30 days.  ? ?  ?  ? ?Patient verbalizes understanding of instructions and care plan provided today and agrees to view in [place]. Active MyChart status confirmed with patient.   ?The pharmacy team will reach out to the patient again over the next 30 days.  ? ?Viona Gilmore, RPH  ?

## 2021-07-15 ENCOUNTER — Ambulatory Visit: Payer: Medicare HMO | Admitting: Nurse Practitioner

## 2021-07-17 ENCOUNTER — Telehealth: Payer: Self-pay | Admitting: Pharmacist

## 2021-07-17 NOTE — Chronic Care Management (AMB) (Signed)
? ? ?  Chronic Care Management ?Pharmacy Assistant  ? ?Name: Matthew Barnes  MRN: [id number] DOB: 20-Apr-1955 ? ?Reason for Encounter: Complete PAP application for Ozempic.  ? ?Application completed to be mailed to the patient.  ? ?Gennie Alma CMA  ?Clinical Pharmacist Assistant ?[phone] ? ?

## 2021-07-23 ENCOUNTER — Other Ambulatory Visit: Payer: Self-pay | Admitting: Family Medicine

## 2021-07-23 DIAGNOSIS — E1159 Type 2 diabetes mellitus with other circulatory complications: Secondary | ICD-10-CM

## 2021-07-23 DIAGNOSIS — E7800 Pure hypercholesterolemia, unspecified: Secondary | ICD-10-CM

## 2021-07-24 DIAGNOSIS — F3132 Bipolar disorder, current episode depressed, moderate: Secondary | ICD-10-CM | POA: Diagnosis not present

## 2021-07-25 ENCOUNTER — Other Ambulatory Visit: Payer: Self-pay | Admitting: Endocrinology

## 2021-07-28 DIAGNOSIS — Z794 Long term (current) use of insulin: Secondary | ICD-10-CM

## 2021-07-28 DIAGNOSIS — I110 Hypertensive heart disease with heart failure: Secondary | ICD-10-CM

## 2021-07-28 DIAGNOSIS — Z7984 Long term (current) use of oral hypoglycemic drugs: Secondary | ICD-10-CM

## 2021-07-28 DIAGNOSIS — I501 Left ventricular failure: Secondary | ICD-10-CM

## 2021-07-28 DIAGNOSIS — E1159 Type 2 diabetes mellitus with other circulatory complications: Secondary | ICD-10-CM | POA: Diagnosis not present

## 2021-07-28 DIAGNOSIS — F319 Bipolar disorder, unspecified: Secondary | ICD-10-CM

## 2021-08-08 ENCOUNTER — Other Ambulatory Visit: Payer: Self-pay | Admitting: Family Medicine

## 2021-08-08 DIAGNOSIS — E1159 Type 2 diabetes mellitus with other circulatory complications: Secondary | ICD-10-CM

## 2021-08-15 ENCOUNTER — Other Ambulatory Visit (INDEPENDENT_AMBULATORY_CARE_PROVIDER_SITE_OTHER): Payer: Medicare HMO

## 2021-08-15 DIAGNOSIS — Z794 Long term (current) use of insulin: Secondary | ICD-10-CM

## 2021-08-15 DIAGNOSIS — E1165 Type 2 diabetes mellitus with hyperglycemia: Secondary | ICD-10-CM | POA: Diagnosis not present

## 2021-08-15 DIAGNOSIS — F3132 Bipolar disorder, current episode depressed, moderate: Secondary | ICD-10-CM | POA: Diagnosis not present

## 2021-08-15 LAB — BASIC METABOLIC PANEL
BUN: 14 mg/dL (ref 6–23)
CO2: 31 mEq/L (ref 19–32)
Calcium: 9.1 mg/dL (ref 8.4–10.5)
Chloride: 103 mEq/L (ref 96–112)
Creatinine, Ser: 1.4 mg/dL (ref 0.40–1.50)
GFR: 52.77 mL/min — ABNORMAL LOW (ref >60.00)
Glucose, Bld: 155 mg/dL — ABNORMAL HIGH (ref 70–99)
Potassium: 4 mEq/L (ref 3.5–5.1)
Sodium: 140 mEq/L (ref 135–145)

## 2021-08-16 ENCOUNTER — Other Ambulatory Visit: Payer: Self-pay | Admitting: Family Medicine

## 2021-08-16 LAB — FRUCTOSAMINE: Fructosamine: 290 umol/L — ABNORMAL HIGH (ref 0–285)

## 2021-08-19 ENCOUNTER — Other Ambulatory Visit: Payer: Self-pay | Admitting: Family Medicine

## 2021-08-19 DIAGNOSIS — I5023 Acute on chronic systolic (congestive) heart failure: Secondary | ICD-10-CM

## 2021-08-21 ENCOUNTER — Encounter: Payer: Self-pay | Admitting: Endocrinology

## 2021-08-21 ENCOUNTER — Ambulatory Visit (INDEPENDENT_AMBULATORY_CARE_PROVIDER_SITE_OTHER): Payer: Medicare HMO | Admitting: Endocrinology

## 2021-08-21 VITALS — BP 150/86 | HR 49 | Ht 73.0 in | Wt 302.0 lb

## 2021-08-21 DIAGNOSIS — E1165 Type 2 diabetes mellitus with hyperglycemia: Secondary | ICD-10-CM

## 2021-08-21 DIAGNOSIS — Z794 Long term (current) use of insulin: Secondary | ICD-10-CM

## 2021-08-21 DIAGNOSIS — E782 Mixed hyperlipidemia: Secondary | ICD-10-CM | POA: Diagnosis not present

## 2021-08-21 DIAGNOSIS — N289 Disorder of kidney and ureter, unspecified: Secondary | ICD-10-CM

## 2021-08-21 LAB — POCT GLYCOSYLATED HEMOGLOBIN (HGB A1C): Hemoglobin A1C: 6.9 % — AB (ref 4.0–5.6)

## 2021-08-21 MED ORDER — OZEMPIC (1 MG/DOSE) 4 MG/3ML ~~LOC~~ SOPN: PEN_INJECTOR | SUBCUTANEOUS | 3 refills | Status: DC

## 2021-08-21 NOTE — Progress Notes (Unsigned)
Patient ID: Matthew Barnes, male   DOB: [birth year], 66 y.o.   MRN: [id number]    Reason for Appointment: Followup for Type 2 Diabetes    History of Present Illness:          Diagnosis: Type 2 diabetes mellitus, date of diagnosis:   2011       Past history:  He was started on metformin at diagnosis and apparently did have fairly good control initially However about a year later because of poor control he was given insulin in addition. He had been taking Lantus insulin until about 2/15, up to 80 units a day However he does not think his blood sugars  Were controlled with this Because of higher A1c of 13.1% he was referred here for diabetes management in 6/15; was started on glucose monitoring and insulin was changed from low dose Levemir to Humalog mix 70 units a day He was also started on Victoza  to help with blood sugar control and facilitate weight loss on his initial consultation  Recent history:   INSULIN regimen is described as: Novolin mix 70/30 with syringe, 60 units at breakfast, 40 units in p.m  Oral hypoglycemic drugs the patient is taking are: Ozempic 0.5 mg weekly   A1c was last 7.3  This has been as low as 5.8 and in 5/22 was 8.3  Fructosamine is 290 compared to 301   Current blood sugar patterns, management of diabetes and problems: Although his control appears to be better with fructosamine his blood sugars in the mornings are highly variable  However he has lost at least 7 pounds, he thinks he is losing more weight with home scale He has not had as much hunger However still periodically will eat sweets causing his blood sugars to be over 200, highest 272 after eating cake  He was recommended 1 mg of Ozempic but he did not call for prescription  Lab glucose 155, not clear if this was fasting Even with increasing his morning insulin his sugars are not getting low later in the day Does not check readings after meals as before He does exercise more regularly  recently and has a gym available in his apartment complex   Side effects from medications have been: Polyuria from Jardiance  Glucose monitoring:  done about 1 times a day or less       Glucometer: Accu-Chek  Blood Glucose readings   PRE-MEAL Fasting Lunch Dinner Bedtime Overall  Glucose range: 155-201 187-250 132-179 181   Mean/median:     183    Glycemic control:   Lab Results  Component Value Date   HGBA1C 7.5 (H) 04/09/2021   HGBA1C 7.3 (A) 11/01/2020   HGBA1C 8.3 (H) 07/31/2020   Lab Results  Component Value Date   MICROALBUR 1.9 02/28/2021   LDLCALC 79 07/31/2020   CREATININE 1.40 08/15/2021   Lab Results  Component Value Date   FRUCTOSAMINE 290 (H) 08/15/2021   FRUCTOSAMINE 301 (H) 06/18/2021   FRUCTOSAMINE 331 (H) 12/11/2020    Self-care: The diet that the patient has been following is: None, unable to control portions and carbs  when depressed  Meals: 3 meals per day (dinner 6 pm-10 pm)        Dietician visit: Most recent: never      CDE visit: 08/2013             Weight history: baseline 295  Wt Readings from Last 3 Encounters:  08/21/21 (!) 302 lb (137 kg)  06/21/21 (!) 309 lb (140.2 kg)  05/20/21 300 lb (136.1 kg)   Lab on 08/15/2021  Component Date Value Ref Range Status   Fructosamine 08/15/2021 290 (H)  0 - 285 umol/L Final   Comment: Published reference interval for apparently healthy subjects between age 64 and 54 is 21 - 285 umol/L and in a poorly controlled diabetic population is 228 - 563 umol/L with a mean of 396 umol/L.    Sodium 08/15/2021 140  135 - 145 mEq/L Final   Potassium 08/15/2021 4.0  3.5 - 5.1 mEq/L Final   Chloride 08/15/2021 103  96 - 112 mEq/L Final   CO2 08/15/2021 31  19 - 32 mEq/L Final   Glucose, Bld 08/15/2021 155 (H)  70 - 99 mg/dL Final   BUN 08/15/2021 14  6 - 23 mg/dL Final   Creatinine, Ser 08/15/2021 1.40  0.40 - 1.50 mg/dL Final   GFR 08/15/2021 52.77 (L)  >60.00 mL/min Final   Calculated using the  CKD-EPI Creatinine Equation (2021)   Calcium 08/15/2021 9.1  8.4 - 10.5 mg/dL Final      Allergies as of 08/21/2021       Reactions   Influenza Vaccines Swelling, Other (See Comments)   Reports of swelling of the arm and then was "sick" for 10 days        Medication List        Accurate as of Aug 21, 2021  9:09 PM. If you have any questions, ask your nurse or doctor.          STOP taking these medications    Ozempic (0.25 or 0.5 MG/DOSE) 2 MG/1.5ML Sopn Generic drug: Semaglutide(0.25 or 0.5MG /DOS) Replaced by: Ozempic (1 MG/DOSE) 4 MG/3ML Sopn Stopped by: Elayne Snare, MD   Ozempic (0.25 or 0.5 MG/DOSE) 2 MG/3ML Sopn Generic drug: Semaglutide(0.25 or 0.5MG /DOS) Stopped by: Elayne Snare, MD       TAKE these medications    Accu-Chek Guide w/Device Kit Check blood sugar 2 times daily   Accu-Chek Softclix Lancets lancets Check blood sugar 2 times daily   atorvastatin 20 MG tablet Commonly known as: LIPITOR TAKE 1 TABLET BY MOUTH EVERY DAY   Blood Pressure Cuff Misc Use as directed   carvedilol 6.25 MG tablet Commonly known as: COREG TAKE 1 TABLET BY MOUTH TWICE A DAY WITH MEALS   cholecalciferol 25 MCG (1000 UNIT) tablet Commonly known as: VITAMIN D3 Take 1,000 Units by mouth daily.   digoxin 0.125 MG tablet Commonly known as: LANOXIN TAKE 1 TABLET BY MOUTH EVERY DAY   furosemide 20 MG tablet Commonly known as: LASIX Take 1 tablet (20 mg total) by mouth daily.   INSULIN SYRINGE 1CC/31GX5/16" 31G X 5/16" 1 ML Misc Use 3 needles per day   lithium carbonate 300 MG capsule Take 3 capsules (900 mg total) by mouth daily. What changed:  how much to take when to take this additional instructions   losartan 50 MG tablet Commonly known as: COZAAR TAKE 1 TABLET BY MOUTH EVERY DAY   NovoLIN 70/30 (70-30) 100 UNIT/ML injection Generic drug: insulin NPH-regular Human INJECT 40 UNITS INTO THE SKIN 2 (TWO) TIMES DAILY WITH A MEAL. What changed: additional  instructions   omeprazole 20 MG capsule Commonly known as: PRILOSEC Take 1 capsule (20 mg total) by mouth daily.   OneTouch Verio test strip Generic drug: glucose blood USE AS INSTRUCTED TO CHECK BLOOD SUGAR 2 TIMES A DAY.   Accu-Chek Guide test strip Generic drug: glucose blood  Use to check blood sugar 2 times daily   Ozempic (1 MG/DOSE) 4 MG/3ML Sopn Generic drug: Semaglutide (1 MG/DOSE) Inject 1 mg weekly Replaces: Ozempic (0.25 or 0.5 MG/DOSE) 2 MG/1.5ML Sopn Started by: Reather Littler, MD   QUEtiapine 200 MG tablet Commonly known as: SEROQUEL Take 450 mg by mouth at bedtime.   sildenafil 50 MG tablet Commonly known as: Viagra Take 1 tablet (50 mg total) by mouth daily as needed for erectile dysfunction. Do not take more then once in 24 hours.        Allergies:  Allergies  Allergen Reactions   Influenza Vaccines Swelling and Other (See Comments)    Reports of swelling of the arm and then was "sick" for 10 days    Past Medical History:  Diagnosis Date   Bipolar II disorder - Managed by Jacqulyn Ducking [phone]) 05/03/2013   Diabetes mellitus    Dysphagia    Hyperlipidemia    Hypertension    Unspecified hypothyroidism 05/03/2013    Past Surgical History:  Procedure Laterality Date   ICD IMPLANT N/A 05/20/2021   Procedure: ICD IMPLANT;  Surgeon: Marinus Maw, MD;  [facility]VAS[facility] CV LAB;  Service: Cardiovascular;  Laterality: N/A;   KNEE SURGERY     scope on left knee   RIGHT/LEFT HEART CATH AND CORONARY ANGIOGRAPHY N/A 08/07/2020   Procedure: RIGHT/LEFT HEART CATH AND CORONARY ANGIOGRAPHY;  Surgeon: Marykay Lex, MD;  Location: [facility] INVASIVE CV LAB;  Service: Cardiovascular;  Laterality: N/A;    Family History  Problem Relation Age of Onset   Diabetes Mother    Hypertension Mother    Heart disease Mother    Heart disease Father    Heart attack Father 50   Diabetes Father    Hypertension Father    Depression Father    Diabetes Sister     Depression Paternal Grandmother    Depression Other     Social History:  reports that he has never smoked. He has never used smokeless tobacco. He reports that he does not drink alcohol and does not use drugs.    Review of Systems   HYPERTENSION:  He is taking losartan 25 mg and carvedilol 6.25 mg from his cardiologist Blood pressure is relatively higher in the office  Blood pressure readings as follows:  BP Readings from Last 3 Encounters:  08/21/21 (!) 150/86  07/07/21 (!) 175/109  06/21/21 140/85   Renal dysfunction: He has mild fluctuation in his creatinine level  No previous history of microalbuminuria  Lab Results  Component Value Date   CREATININE 1.40 08/15/2021   CREATININE 1.30 (H) 07/07/2021   CREATININE 1.35 06/18/2021   HEART failure: He has had CHF and was in the emergency room for treatment in July Continues to be on Lasix and Lanoxin along with Coreg and Jardiance       LIPIDS: He has had marked increase in triglycerides previously, now improved.  Taking 20 mg Lipitor  Did not have any coronary artery disease  He is also followed by PCP       Lab Results  Component Value Date   CHOL 116 07/31/2020   HDL 24 (L) 07/31/2020   LDLCALC 79 07/31/2020   LDLDIRECT 73.0 09/01/2017   TRIG 65 07/31/2020   CHOLHDL 4.8 07/31/2020       Thyroid:  Previously had mild hypothyroidism for a few years TSH subsequently has been normal consistently without taking any thyroid supplements  Has been on lithium long-term  Lab Results  Component Value Date   TSH 2.39 06/18/2021        He has had some numbness in his feet or hands, more on the right side, using gabapentin more frequently now with increased symptoms  Findings on last exam:   Patchy decrease in sensation in the toes especially right and decreased on the plantar surface also   LABS:  Lab on 08/15/2021  Component Date Value Ref Range Status   Fructosamine 08/15/2021 290 (H)  0 - 285 umol/L Final    Comment: Published reference interval for apparently healthy subjects between age 73 and 23 is 9 - 285 umol/L and in a poorly controlled diabetic population is 228 - 563 umol/L with a mean of 396 umol/L.    Sodium 08/15/2021 140  135 - 145 mEq/L Final   Potassium 08/15/2021 4.0  3.5 - 5.1 mEq/L Final   Chloride 08/15/2021 103  96 - 112 mEq/L Final   CO2 08/15/2021 31  19 - 32 mEq/L Final   Glucose, Bld 08/15/2021 155 (H)  70 - 99 mg/dL Final   BUN 08/15/2021 14  6 - 23 mg/dL Final   Creatinine, Ser 08/15/2021 1.40  0.40 - 1.50 mg/dL Final   GFR 08/15/2021 52.77 (L)  >60.00 mL/min Final   Calculated using the CKD-EPI Creatinine Equation (2021)   Calcium 08/15/2021 9.1  8.4 - 10.5 mg/dL Final    Physical Examination:  BP (!) 150/86   Pulse (!) 49   Ht $R'6\' 1"'wF$  (1.854 m)   Wt (!) 302 lb (137 kg)   SpO2 96%   BMI 39.84 kg/m      ASSESSMENT/PLAN:   Diabetes type 2, on insulin  See history of present illness for detailed discussion of his current management, blood sugar patterns and problems identified  A1c is last 7.5  He is on a regimen of premixed insulin and Ozempic  His blood sugars are likely better controlled with improved fructosamine home with continuing Ozempic and he has lost weight Glucose monitoring at home is inadequate Most of his blood sugar spikes are related to more carbohydrate or sweets Subjectively feeling better also   Recommendations: 1 mg Ozempic weekly Continue same dose of insulin but consider reducing it if he starts getting any hypoglycemia Encouraged him to exercise regularly Start checking blood sugars consistently after meals to help adjust his insulin especially after dinner Discussed utility of the freestyle libre sensor for CGM and how this would be used, benefits and how this will help his dietary management and control His prescription will be sent through [facility] He can use his phone app but if he needs any help starting we can  have him see the nurse educator  RENAL dysfunction: Relatively stable  Hypertension: Currently on minimal medications and likely needs more aggressive treatment Also can start checking at home Due to be seen in cardiologist office on Friday  Hypercholesterolemia: Needs follow-up lipid panel  Patient Instructions  Check blood sugars on waking up    Also check blood sugars about 2 hours after meals and do this after different meals by rotation  Recommended blood sugar levels on waking up are 90-130 and about 2 hours after meal is 130-160  Please bring your blood sugar monitor to each visit, thank you  Ozempic $Remove'1mg'inbnRLz$  weekly  Libre 2 sensors    Elayne Snare 08/21/2021, 9:09 PM   Note: This office note was prepared with Dragon voice recognition system technology. Any transcriptional errors that result from  this process are unintentional.

## 2021-08-21 NOTE — Progress Notes (Signed)
Cardiology Office Note:    Date:  08/23/2021   ID:  Matthew Barnes, Matthew Barnes [birth year], MRN [id number]  PCP:  Caren Macadam, MD   [facility] Providers Cardiologist:  Quay Burow, MD Electrophysiologist:  [name], MD     Referring MD: Caren Macadam, MD   Chief Complaint: follow-up NICM  History of Present Illness:    Matthew Barnes is a pleasant 66 y.o. male with a hx of nonischemic cardiomyopathy s/p ICD implant, hyperlipidemia, hypertension, diabetes, bipolar disorder,   He was referred to cardiology 03/26/2018 and seen by Dr. Gwenlyn Found for evaluation of chest pain and shortness of breath. Exercise myoview and echo were ordered but appears to have never been completed.  Admission 5/2- 08/08/2020 with malaise, anxiety, difficulty finding his words. Echo revealed severe LV dysfunction 20-25% but veyr poor study due to poor acoustical images and global LV hypokinesis and pericardial effusion. Cardiology was consulted. Mild elevation in troponin.  Twelve-lead EKG showed nonspecific ST abnormality.  He reported exertional angina but also had symptoms concerning for stroke but acute stroke was ruled out.  Right and left heart cath on 08/07/2020 revealed angiographically normal coronary arteries, severely elevated LVEDP P ,moderate secondary pulmonary hypertension consistent with combined systolic and diastolic heart failure.  He was discharged on GDMT including beta-blocker, low-dose ARB, digoxin, and Lasix.   He did not return for outpatient follow-up until 04/12/2021 at which time Dr. Marlou Porch increased carvedilol, continued Jardiance and losartan but was unable to initiate Entresto and spironolactone due to elevated creatinine.  Repeat echocardiogram revealed EF remains low at 25% and he was referred to EP for consideration of ICD.  He underwent ICD implant 05/20/2021 by Dr. Lovena Le.  Today, he is here alone for follow-up.  Reports he is feeling better since ICD implant, can walk further  without shortness of breath. 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 Recently started getting on Elliptical recently machine for short periods. Unable to tolerate 30 minutes due to bilateral knee pain, says he needs knee replacements. Is on Ozempic, has lost 15-20 lbs. He denies chest pain, fatigue, palpitations, melena, hematuria, hemoptysis, diaphoresis, weakness, presyncope, syncope, orthopnea, and PND. Has mild bilateral lower extremity swelling, particularly after long drives.    Past Medical History:  Diagnosis Date   Bipolar II disorder - Managed by Marye Round [phone]) 05/03/2013   Diabetes mellitus    Dysphagia    Hyperlipidemia    Hypertension    Unspecified hypothyroidism 05/03/2013    Past Surgical History:  Procedure Laterality Date   ICD IMPLANT N/A 05/20/2021   Procedure: ICD IMPLANT;  Surgeon: Evans Lance, MD;  Location: [place] [facility];  Service: Cardiovascular;  Laterality: N/A;   KNEE SURGERY     scope on left knee   RIGHT/LEFT HEART CATH AND CORONARY ANGIOGRAPHY N/A 08/07/2020   Procedure: RIGHT/LEFT HEART CATH AND CORONARY ANGIOGRAPHY;  Surgeon: Leonie Man, MD;  Location: [place] [facility];  Service: Cardiovascular;  Laterality: N/A;    Current Medications: Current Meds  Medication Sig   Accu-Chek Softclix Lancets lancets Check blood sugar 2 times daily   atorvastatin (LIPITOR) 20 MG tablet TAKE 1 TABLET BY MOUTH EVERY DAY   Blood Glucose Monitoring Suppl (ACCU-CHEK GUIDE) w/Device KIT Check blood sugar 2 times daily   Blood Pressure Monitoring (BLOOD PRESSURE CUFF) MISC Use as directed   carvedilol (COREG) 6.25 MG tablet TAKE 1 TABLET BY MOUTH TWICE A DAY WITH  MEALS   cholecalciferol (VITAMIN D3) 25 MCG (1000 UNIT) tablet Take 1,000 Units by mouth daily.   digoxin (LANOXIN) 0.125 MG tablet TAKE 1 TABLET BY MOUTH EVERY DAY   furosemide (LASIX) 20 MG tablet Take 1 tablet (20 mg total) by mouth daily.   glucose  blood (ACCU-CHEK GUIDE) test strip Use to check blood sugar 2 times daily   glucose blood (ONETOUCH VERIO) test strip USE AS INSTRUCTED TO CHECK BLOOD SUGAR 2 TIMES A DAY.   Insulin Syringe-Needle U-100 (INSULIN SYRINGE 1CC/31GX5/16") 31G X 5/16" 1 ML MISC Use 3 needles per day   lithium carbonate 300 MG capsule Take 3 capsules (900 mg total) by mouth daily.   NOVOLIN 70/30 (70-30) 100 UNIT/ML injection INJECT 40 UNITS INTO THE SKIN 2 (TWO) TIMES DAILY WITH A MEAL. (Patient taking differently: Inject 40 Units into the skin 2 (two) times daily with a meal. 60 units am and 40units at dinner)   omeprazole (PRILOSEC) 20 MG capsule Take 1 capsule (20 mg total) by mouth daily.   QUEtiapine (SEROQUEL) 200 MG tablet Take 450 mg by mouth at bedtime.   sacubitril-valsartan (ENTRESTO) 49-51 MG Take 1 tablet by mouth 2 (two) times daily.   Semaglutide, 1 MG/DOSE, (OZEMPIC, 1 MG/DOSE,) 4 MG/3ML SOPN Inject 1 mg weekly   sildenafil (VIAGRA) 50 MG tablet Take 1 tablet (50 mg total) by mouth daily as needed for erectile dysfunction. Do not take more then once in 24 hours.   [DISCONTINUED] losartan (COZAAR) 50 MG tablet TAKE 1 TABLET BY MOUTH EVERY DAY     Allergies:   Influenza vaccines   Social History   Socioeconomic History   Marital status: Single    Spouse name: Not on file   Number of children: Not on file   Years of education: Not on file   Highest education level: Not on file  Occupational History   Not on file  Tobacco Use   Smoking status: Never   Smokeless tobacco: Never  Vaping Use   Vaping Use: Never used  Substance and Sexual Activity   Alcohol use: No   Drug use: No   Sexual activity: Not Currently  Other Topics Concern   Not on file  Social History Narrative   Work or School: on disability for knee arthritis      Home Situation: lives alone      Spiritual Beliefs:Christian      Lifestyle:no regular exercising; diet not great            Social Determinants of Health    Financial Resource Strain: Low Risk    Difficulty of Paying Living Expenses: Not hard at all  Food Insecurity: No Food Insecurity   Worried About Charity fundraiser in the Last Year: Never true   Arboriculturist in the Last Year: Never true  Transportation Needs: No Transportation Needs   Lack of Transportation (Medical): No   Lack of Transportation (Non-Medical): No  Physical Activity: Inactive   Days of Exercise per Week: 0 days   Minutes of Exercise per Session: 0 min  Stress: No Stress Concern Present   Feeling of Stress : Not at all  Social Connections: Not on file     Family History: The patient's family history includes Depression in his father, paternal grandmother, and another family member; Diabetes in his father, mother, and sister; Heart attack (age of onset: 71) in his father; Heart disease in his father and mother; Hypertension in his father  and mother.  ROS:   Please see the history of present illness.    + bilateral non-pitting LE edema All other systems reviewed and are negative.  Labs/Other Studies Reviewed:    The following studies were reviewed today:  2D Echo 04/26/21  1. Left ventricular ejection fraction, by estimation, is 20 to 25%. The  left ventricle has severely decreased function. The left ventricle  demonstrates global hypokinesis. The left ventricular internal cavity size  was severely dilated. Left ventricular  diastolic parameters are consistent with Grade II diastolic dysfunction  (pseudonormalization). Elevated left ventricular end-diastolic pressure.   2. Right ventricular systolic function is normal. The right ventricular  size is normal. There is severely elevated pulmonary artery systolic  pressure.   3. Left atrial size was moderately dilated.   4. Right atrial size was mildly dilated.   5. The mitral valve is abnormal. Mild to moderate mitral valve  regurgitation. No evidence of mitral stenosis.   6. The aortic valve was not well  visualized. There is mild calcification  of the aortic valve. Aortic valve regurgitation is mild. Aortic valve  sclerosis is present, with no evidence of aortic valve stenosis.   7. Seems to be a lot of turbulence in RVOT . Pulmonic valve regurgitation  is moderate.   8. The inferior vena cava is normal in size with greater than 50%  respiratory variability, suggesting right atrial pressure of 3 mmHg.   Comparison(s): 08/03/20 EF 20-25%.   [facility] 08/07/20  Hemodynamic findings consistent with moderate pulmonary hypertension. LV end diastolic pressure is severely elevated. There is no aortic valve stenosis.   SUMMARY Angiographically normal coronary arteries. Moderate Secondary Pulmonary Hypertension with PAP-mean of 51/24 mmHg - 37 mmHg; PCWP 30-32 mmHg with LVEDP of 30-35 mmHg. In setting of known systolic heart failure this to be consistent with COMBINED SYSTOLIC AND DIASTOLIC HEART FAILURE     RECOMMENDATIONS Continue to titrate cardiomyopathy medications, he will be given additional IV Lasix in the holding area. Needs additional afterload reduction and diuretic. He will return to [facility] after PRN removal/bedrest   2D Limited Echo 08/01/20  1. No subcostal images.   2. Left ventricular ejection fraction, by estimation, is 20 to 25%. The  left ventricle has severely decreased function. The left ventricle  demonstrates global hypokinesis. The left ventricular internal cavity size  was severely dilated. Left ventricular  diastolic parameters are indeterminate.   3. Right ventricular systolic function is normal. The right ventricular  size is normal. There is mildly elevated pulmonary artery systolic  pressure.   4. The pericardial effusion is posterior to the left ventricle.   5. The mitral valve is normal in structure. Mild mitral valve  regurgitation. No evidence of mitral stenosis.   6. The aortic valve is tricuspid. Aortic valve regurgitation is not  visualized.  Mild aortic valve sclerosis is present, with no evidence of  aortic valve stenosis.   7. The inferior vena cava is normal in size with greater than 50%  respiratory variability, suggesting right atrial pressure of 3 mmHg.   Recent Labs: 02/28/2021: Pro B Natriuretic peptide (BNP) 93.0 06/18/2021: TSH 2.39 07/07/2021: ALT 20; B Natriuretic Peptide 222.6; Hemoglobin 12.6; Platelets 132 08/15/2021: BUN 14; Creatinine, Ser 1.40; Potassium 4.0; Sodium 140  Recent Lipid Panel    Component Value Date/Time   CHOL 116 07/31/2020 0409   TRIG 65 07/31/2020 0409   HDL 24 (L) 07/31/2020 0409   CHOLHDL 4.8 07/31/2020 0409   VLDL  13 07/31/2020 0409   LDLCALC 79 07/31/2020 0409   LDLDIRECT 73.0 09/01/2017 1054     Risk Assessment/Calculations:            Physical Exam:    VS:  BP 130/80 (BP Location: Left Arm, Patient Position: Sitting, Cuff Size: Normal)   Pulse 82   Ht _0  (1.854 m)   Wt (!) 302 lb (137 kg)   SpO2 95%   BMI 39.84 kg/m     Wt Readings from Last 3 Encounters:  08/23/21 (!) 302 lb (137 kg)  08/21/21 (!) 302 lb (137 kg)  06/21/21 (!) 309 lb (140.2 kg)     GEN: Well developed, obese male in no acute distress HEENT: Normal NECK: No JVD; No carotid bruits CARDIAC: RRR, no murmurs, rubs, gallops RESPIRATORY:  Clear to auscultation without rales, wheezing or rhonchi  ABDOMEN: Soft, non-tender, non-distended MUSCULOSKELETAL: Non-pitting bilateral LE edema; No deformity. 2+ pedal pulses, equal bilaterally SKIN: Warm and dry NEUROLOGIC:  Alert and oriented x 3 PSYCHIATRIC:  Normal affect   EKG:  EKG is not ordered today.  Diagnoses:    1. Dilated cardiomyopathy ([place])   2. Diabetes mellitus with cardiac complication ([place])   3. Chronic combined systolic and diastolic CHF, NYHA class 2 ([place])   4. ICD (implantable cardioverter-defibrillator) in place   5. Obesity (BMI 30-39.9)    Assessment and Plan:     Chronic combined CHF NYHA Class 1/2: Significantly reduced  LVEF 20-25%, G2 DD by echo 1/27. Reports improvement in activity tolerance and less dyspnea with exertion since implant of ICD. It is difficult to determine how much his obesity contributes to dyspnea.  We will discontinue losartan and start Entresto 49-51 mg twice daily. Discussion with patient and pharmacist regarding price and he feels it will be reasonable for him. He will return in 1 week for basic metabolic panel. Plan for return office visit in 2 months for up-titration of GDMT.  S/p ICD implant: Carelink ICD implanted 05/20/21.  Management per EP.  Has appointment with Dr. Lovena Le in June.  Hypertension: BP is well-controlled.  He does not monitor on a consistent basis.  Advised him to monitor several times per week, particularly in the setting of starting Entresto.  Advised him to notify us prior to next appointment if he has any concerns.  Hyperlipidemia LDL goal < 70: LDL 79 07/2020.  Has lipid panel ordered by Dr. Dwyane Dee, endocrinologist.  Emphasized the importance of well-controlled cholesterol in the setting of diabetes and heart disease.  Obesity: On Ozempic and reports a 15-20 lb weight loss.  We discussed the importance of mostly plant-based, low saturated fat diet.  I encouraged him to choose 1 goal to achieve prior to next office visit. Offered guidance and information, as much as he would like. Provided information on DASH and mediterranean diets.     Disposition: Keep your June appointment with Dr. Lovena Le; 2 month f/u with Dr. Marlou Porch or APP  Medication Adjustments/Labs and Tests Ordered: Current medicines are reviewed at length with the patient today.  Concerns regarding medicines are outlined above.  Orders Placed This Encounter  Procedures   Basic Metabolic Panel (BMET)   Meds ordered this encounter  Medications   sacubitril-valsartan (ENTRESTO) 49-51 MG    Sig: Take 1 tablet by mouth 2 (two) times daily.    Dispense:  60 tablet    Refill:  6    Patient Instructions   Medication Instructions:   DISCONTINUE Losartan  START Delene Loll  one (1) tablet by mouth (49-51 mg) twice daily.   *If you need a refill on your cardiac medications before your next appointment, please call your pharmacy*   Lab Work:  Your physician recommends that you return for lab work on Friday, June 2.  You can come in on the day of your appointment anytime between 7:30-4:30.     If you have labs (blood work) drawn today and your tests are completely normal, you will receive your results only by: [place] (if you have MyChart) OR A paper copy in the mail If you have any lab test that is abnormal or we need to change your treatment, we will call you to review the results.   Testing/Procedures:   None ordered.    Follow-Up: At [facility], you and your health needs are our priority.  As part of our continuing mission to provide you with exceptional heart care, we have created designated Provider Care Teams.  These Care Teams include your primary Cardiologist (physician) and Advanced Practice Providers (APPs -  Physician Assistants and Nurse Practitioners) who all work together to provide you with the care you need, when you need it.  We recommend signing up for the patient portal called "MyChart".  Sign up information is provided on this After Visit Summary.  MyChart is used to connect with patients for Virtual Visits (Telemedicine).  Patients are able to view lab/test results, encounter notes, upcoming appointments, etc.  Non-urgent messages can be sent to your provider as well.   To learn more about what you can do with MyChart, go to [email or web address].    Your next appointment:   2 month(s)  The format for your next appointment:   In Person  Provider:   Dr. Candee Furbish.   If primary card or EP is not listed click here to update    :1}    Other Instructions  DASH Eating Plan DASH stands for Dietary Approaches to Stop Hypertension. The DASH eating  plan is a healthy eating plan that has been shown to: Reduce high blood pressure (hypertension). Reduce your risk for type 2 diabetes, heart disease, and stroke. Help with weight loss. What are tips for following this plan? Reading food labels Check food labels for the amount of salt (sodium) per serving. Choose foods with less than 5 percent of the Daily Value of sodium. Generally, foods with less than 300 milligrams (mg) of sodium per serving fit into this eating plan. To find whole grains, look for the word "whole" as the first word in the ingredient list. Shopping Buy products labeled as "low-sodium" or "no salt added." Buy fresh foods. Avoid canned foods and pre-made or frozen meals. Cooking Avoid adding salt when cooking. Use salt-free seasonings or herbs instead of table salt or sea salt. Check with your health care provider or pharmacist before using salt substitutes. Do not fry foods. Cook foods using healthy methods such as baking, boiling, grilling, roasting, and broiling instead. Cook with heart-healthy oils, such as olive, canola, avocado, soybean, or sunflower oil. Meal planning  Eat a balanced diet that includes: 4 or more servings of fruits and 4 or more servings of vegetables each day. Try to fill one-half of your plate with fruits and vegetables. 6-8 servings of whole grains each day. Less than 6 oz (170 g) of lean meat, poultry, or fish each day. A 3-oz (85-g) serving of meat is about the same size as a deck of cards. One egg equals 1 oz (  28 g). 2-3 servings of low-fat dairy each day. One serving is 1 cup (237 mL). 1 serving of nuts, seeds, or beans 5 times each week. 2-3 servings of heart-healthy fats. Healthy fats called omega-3 fatty acids are found in foods such as walnuts, flaxseeds, fortified milks, and eggs. These fats are also found in cold-water fish, such as sardines, salmon, and mackerel. Limit how much you eat of: Canned or prepackaged foods. Food that is high  in trans fat, such as some fried foods. Food that is high in saturated fat, such as fatty meat. Desserts and other sweets, sugary drinks, and other foods with added sugar. Full-fat dairy products. Do not salt foods before eating. Do not eat more than 4 egg yolks a week. Try to eat at least 2 vegetarian meals a week. Eat more home-cooked food and less restaurant, buffet, and fast food. Lifestyle When eating at a restaurant, ask that your food be prepared with less salt or no salt, if possible. If you drink alcohol: Limit how much you use to: 0-1 drink a day for women who are not pregnant. 0-2 drinks a day for men. Be aware of how much alcohol is in your drink. In the [place] one drink equals one 12 oz bottle of beer (355 mL), one 5 oz glass of wine (148 mL), or one 1 oz glass of hard liquor (44 mL). General information Avoid eating more than 2,300 mg of salt a day. If you have hypertension, you may need to reduce your sodium intake to 1,500 mg a day. Work with your health care provider to maintain a healthy body weight or to lose weight. Ask what an ideal weight is for you. Get at least 30 minutes of exercise that causes your heart to beat faster (aerobic exercise) most days of the week. Activities may include walking, swimming, or biking. Work with your health care provider or dietitian to adjust your eating plan to your individual calorie needs. What foods should I eat? Fruits All fresh, dried, or frozen fruit. Canned fruit in natural juice (without added sugar). Vegetables Fresh or frozen vegetables (raw, steamed, roasted, or grilled). Low-sodium or reduced-sodium tomato and vegetable juice. Low-sodium or reduced-sodium tomato sauce and tomato paste. Low-sodium or reduced-sodium canned vegetables. Grains Whole-grain or whole-wheat bread. Whole-grain or whole-wheat pasta. Brown rice. Modena Morrow. Bulgur. Whole-grain and low-sodium cereals. Pita bread. Low-fat, low-sodium crackers.  Whole-wheat flour tortillas. Meats and other proteins Skinless chicken or [place]. Ground chicken or [place]. Pork with fat trimmed off. Fish and seafood. Egg whites. Dried beans, peas, or lentils. Unsalted nuts, nut butters, and seeds. Unsalted canned beans. Lean cuts of beef with fat trimmed off. Low-sodium, lean precooked or cured meat, such as sausages or meat loaves. Dairy Low-fat (1%) or fat-free (skim) milk. Reduced-fat, low-fat, or fat-free cheeses. Nonfat, low-sodium ricotta or cottage cheese. Low-fat or nonfat yogurt. Low-fat, low-sodium cheese. Fats and oils Soft margarine without trans fats. Vegetable oil. Reduced-fat, low-fat, or light mayonnaise and salad dressings (reduced-sodium). Canola, safflower, olive, avocado, soybean, and sunflower oils. Avocado. Seasonings and condiments Herbs. Spices. Seasoning mixes without salt. Other foods Unsalted popcorn and pretzels. Fat-free sweets. The items listed above may not be a complete list of foods and beverages you can eat. Contact a dietitian for more information. What foods should I avoid? Fruits Canned fruit in a light or heavy syrup. Fried fruit. Fruit in cream or butter sauce. Vegetables Creamed or fried vegetables. Vegetables in a cheese sauce. Regular canned vegetables (not low-sodium  or reduced-sodium). Regular canned tomato sauce and paste (not low-sodium or reduced-sodium). Regular tomato and vegetable juice (not low-sodium or reduced-sodium). Angie Fava. Olives. Grains Baked goods made with fat, such as croissants, muffins, or some breads. Dry pasta or rice meal packs. Meats and other proteins Fatty cuts of meat. Ribs. Fried meat. Berniece Salines. Bologna, salami, and other precooked or cured meats, such as sausages or meat loaves. Fat from the back of a pig (fatback). Bratwurst. Salted nuts and seeds. Canned beans with added salt. Canned or smoked fish. Whole eggs or egg yolks. Chicken or [place] with skin. Dairy Whole or 2% milk, cream, and  half-and-half. Whole or full-fat cream cheese. Whole-fat or sweetened yogurt. Full-fat cheese. Nondairy creamers. Whipped toppings. Processed cheese and cheese spreads. Fats and oils Butter. Stick margarine. Lard. Shortening. Ghee. Bacon fat. Tropical oils, such as coconut, palm kernel, or palm oil. Seasonings and condiments Onion salt, garlic salt, seasoned salt, table salt, and sea salt. Worcestershire sauce. Tartar sauce. Barbecue sauce. Teriyaki sauce. Soy sauce, including reduced-sodium. Steak sauce. Canned and packaged gravies. Fish sauce. Oyster sauce. Cocktail sauce. Store-bought horseradish. Ketchup. Mustard. Meat flavorings and tenderizers. Bouillon cubes. Hot sauces. Pre-made or packaged marinades. Pre-made or packaged taco seasonings. Relishes. Regular salad dressings. Other foods Salted popcorn and pretzels. The items listed above may not be a complete list of foods and beverages you should avoid. Contact a dietitian for more information. Where to find more information National Heart, Lung, and Blood Institute: https://wilson-eaton.com/ American Heart Association: www.heart.org Academy of Nutrition and Dietetics: www.eatright.[place]: www.kidney.org Summary The DASH eating plan is a healthy eating plan that has been shown to reduce high blood pressure (hypertension). It may also reduce your risk for type 2 diabetes, heart disease, and stroke. When on the DASH eating plan, aim to eat more fresh fruits and vegetables, whole grains, lean proteins, low-fat dairy, and heart-healthy fats. With the DASH eating plan, you should limit salt (sodium) intake to 2,300 mg a day. If you have hypertension, you may need to reduce your sodium intake to 1,500 mg a day. Work with your health care provider or dietitian to adjust your eating plan to your individual calorie needs. This information is not intended to replace advice given to you by your health care provider. Make sure you  discuss any questions you have with your health care provider. Document Revised: 02/18/2019 Document Reviewed: 02/18/2019 Elsevier Patient Education  [place] refers to food and lifestyle choices that are based on the traditions of countries located on the [employer]. It focuses on eating more fruits, vegetables, whole grains, beans, nuts, seeds, and heart-healthy fats, and eating less dairy, meat, eggs, and processed foods with added sugar, salt, and fat. This way of eating has been shown to help prevent certain conditions and improve outcomes for people who have chronic diseases, like kidney disease and heart disease. What are tips for following this plan? Reading food labels Check the serving size of packaged foods. For foods such as rice and pasta, the serving size refers to the amount of cooked product, not dry. Check the total fat in packaged foods. Avoid foods that have saturated fat or trans fats. Check the ingredient list for added sugars, such as corn syrup. Shopping  Buy a variety of foods that offer a balanced diet, including: Fresh fruits and vegetables (produce). Grains, beans, nuts, and seeds. Some of these may be available in unpackaged forms or large amounts (  in bulk). Fresh seafood. Poultry and eggs. Low-fat dairy products. Buy whole ingredients instead of prepackaged foods. Buy fresh fruits and vegetables in-season from local farmers markets. Buy plain frozen fruits and vegetables. If you do not have access to quality fresh seafood, buy precooked frozen shrimp or canned fish, such as tuna, salmon, or sardines. Stock your pantry so you always have certain foods on hand, such as olive oil, canned tuna, canned tomatoes, rice, pasta, and beans. Cooking Cook foods with extra-virgin olive oil instead of using butter or other vegetable oils. Have meat as a side dish, and have vegetables or grains as your main dish.  This means having meat in small portions or adding small amounts of meat to foods like pasta or stew. Use beans or vegetables instead of meat in common dishes like chili or lasagna. Experiment with different cooking methods. Try roasting, broiling, steaming, and sauting vegetables. Add frozen vegetables to soups, stews, pasta, or rice. Add nuts or seeds for added healthy fats and plant protein at each meal. You can add these to yogurt, salads, or vegetable dishes. Marinate fish or vegetables using olive oil, lemon juice, garlic, and fresh herbs. Meal planning Plan to eat one vegetarian meal one day each week. Try to work up to two vegetarian meals, if possible. Eat seafood two or more times a week. Have healthy snacks readily available, such as: Vegetable sticks with hummus. Greek yogurt. Fruit and nut trail mix. Eat balanced meals throughout the week. This includes: Fruit: 2-3 servings a day. Vegetables: 4-5 servings a day. Low-fat dairy: 2 servings a day. Fish, poultry, or lean meat: 1 serving a day. Beans and legumes: 2 or more servings a week. Nuts and seeds: 1-2 servings a day. Whole grains: 6-8 servings a day. Extra-virgin olive oil: 3-4 servings a day. Limit red meat and sweets to only a few servings a month. Lifestyle  Cook and eat meals together with your family, when possible. Drink enough fluid to keep your urine pale yellow. Be physically active every day. This includes: Aerobic exercise like running or swimming. Leisure activities like gardening, walking, or housework. Get 7-8 hours of sleep each night. If recommended by your health care provider, drink red wine in moderation. This means 1 glass a day for nonpregnant women and 2 glasses a day for men. A glass of wine equals 5 oz (150 mL). What foods should I eat? Fruits Apples. Apricots. Avocado. Berries. Bananas. Cherries. Dates. Figs. Grapes. Lemons. Melon. Oranges. Peaches. Plums.  Pomegranate. Vegetables Artichokes. Beets. Broccoli. Cabbage. Carrots. Eggplant. Green beans. Chard. Kale. Spinach. Onions. Leeks. Peas. Squash. Tomatoes. Peppers. Radishes. Grains Whole-grain pasta. Brown rice. Bulgur wheat. Polenta. Couscous. Whole-wheat bread. [place]. Meats and other proteins Beans. Almonds. Sunflower seeds. Pine nuts. Peanuts. [place]. Salmon. Scallops. Shrimp. [place]. Tilapia. Clams. Oysters. Eggs. Poultry without skin. Dairy Low-fat milk. Cheese. Greek yogurt. Fats and oils Extra-virgin olive oil. Avocado oil. Grapeseed oil. Beverages Water. Red wine. Herbal tea. Sweets and desserts Greek yogurt with honey. Baked apples. Poached pears. Trail mix. Seasonings and condiments Basil. Cilantro. Coriander. Cumin. Mint. Parsley. Sage. Rosemary. Tarragon. Garlic. Oregano. Thyme. Pepper. Balsamic vinegar. Tahini. Hummus. Tomato sauce. Olives. Mushrooms. The items listed above may not be a complete list of foods and beverages you can eat. Contact a dietitian for more information. What foods should I limit? This is a list of foods that should be eaten rarely or only on special occasions. Fruits Fruit canned in syrup. Vegetables Deep-fried potatoes (french fries). Grains Prepackaged  pasta or rice dishes. Prepackaged cereal with added sugar. Prepackaged snacks with added sugar. Meats and other proteins Beef. Pork. Lamb. Poultry with skin. Hot dogs. Berniece Salines. Dairy Ice cream. Sour cream. Whole milk. Fats and oils Butter. Canola oil. Vegetable oil. Beef fat (tallow). Lard. Beverages Juice. Sugar-sweetened soft drinks. Beer. Liquor and spirits. Sweets and desserts Cookies. Cakes. Pies. Candy. Seasonings and condiments Mayonnaise. Pre-made sauces and marinades. The items listed above may not be a complete list of foods and beverages you should limit. Contact a dietitian for more information. Summary The Mediterranean diet includes both food and lifestyle choices. Eat a  variety of fresh fruits and vegetables, beans, nuts, seeds, and whole grains. Limit the amount of red meat and sweets that you eat. If recommended by your health care provider, drink red wine in moderation. This means 1 glass a day for nonpregnant women and 2 glasses a day for men. A glass of wine equals 5 oz (150 mL). This information is not intended to replace advice given to you by your health care provider. Make sure you discuss any questions you have with your health care provider. Document Revised: 04/22/2019 Document Reviewed: 02/17/2019 Elsevier Patient Education  [place]         Signed, Emmaline Life, NP  08/23/2021 12:02 PM    [facility]

## 2021-08-21 NOTE — Patient Instructions (Addendum)
Check blood sugars on waking up    Also check blood sugars about 2 hours after meals and do this after different meals by rotation  Recommended blood sugar levels on waking up are 90-130 and about 2 hours after meal is 130-160  Please bring your blood sugar monitor to each visit, thank you  Ozempic '1mg'$  weekly  Libre 2 sensors

## 2021-08-23 ENCOUNTER — Ambulatory Visit: Payer: Medicare HMO | Admitting: Nurse Practitioner

## 2021-08-23 ENCOUNTER — Encounter: Payer: Self-pay | Admitting: Nurse Practitioner

## 2021-08-23 VITALS — BP 130/80 | HR 82 | Ht 73.0 in | Wt 302.0 lb

## 2021-08-23 DIAGNOSIS — Z95810 Presence of automatic (implantable) cardiac defibrillator: Secondary | ICD-10-CM

## 2021-08-23 DIAGNOSIS — E669 Obesity, unspecified: Secondary | ICD-10-CM

## 2021-08-23 DIAGNOSIS — I5042 Chronic combined systolic (congestive) and diastolic (congestive) heart failure: Secondary | ICD-10-CM | POA: Diagnosis not present

## 2021-08-23 DIAGNOSIS — E1159 Type 2 diabetes mellitus with other circulatory complications: Secondary | ICD-10-CM | POA: Diagnosis not present

## 2021-08-23 DIAGNOSIS — I420 Dilated cardiomyopathy: Secondary | ICD-10-CM

## 2021-08-23 MED ORDER — ENTRESTO 49-51 MG PO TABS: 1.0000 | ORAL_TABLET | Freq: Two times a day (BID) | ORAL | 6 refills | Status: DC

## 2021-08-23 NOTE — Patient Instructions (Signed)
Medication Instructions:   DISCONTINUE Losartan  START Entresto one (1) tablet by mouth (49-51 mg) twice daily.   *If you need a refill on your cardiac medications before your next appointment, please call your pharmacy*   Lab Work:  Your physician recommends that you return for lab work on Friday, June 2.  You can come in on the day of your appointment anytime between 7:30-4:30.     If you have labs (blood work) drawn today and your tests are completely normal, you will receive your results only by: [place] (if you have MyChart) OR A paper copy in the mail If you have any lab test that is abnormal or we need to change your treatment, we will call you to review the results.   Testing/Procedures:   None ordered.    Follow-Up: At [facility], you and your health needs are our priority.  As part of our continuing mission to provide you with exceptional heart care, we have created designated Provider Care Teams.  These Care Teams include your primary Cardiologist (physician) and Advanced Practice Providers (APPs -  Physician Assistants and Nurse Practitioners) who all work together to provide you with the care you need, when you need it.  We recommend signing up for the patient portal called "MyChart".  Sign up information is provided on this After Visit Summary.  MyChart is used to connect with patients for Virtual Visits (Telemedicine).  Patients are able to view lab/test results, encounter notes, upcoming appointments, etc.  Non-urgent messages can be sent to your provider as well.   To learn more about what you can do with MyChart, go to [email or web address].    Your next appointment:   2 month(s)  The format for your next appointment:   In Person  Provider:   Dr. Candee Furbish.   If primary card or EP is not listed click here to update    :1}    Other Instructions  DASH Eating Plan DASH stands for Dietary Approaches to Stop Hypertension. The DASH eating  plan is a healthy eating plan that has been shown to: Reduce high blood pressure (hypertension). Reduce your risk for type 2 diabetes, heart disease, and stroke. Help with weight loss. What are tips for following this plan? Reading food labels Check food labels for the amount of salt (sodium) per serving. Choose foods with less than 5 percent of the Daily Value of sodium. Generally, foods with less than 300 milligrams (mg) of sodium per serving fit into this eating plan. To find whole grains, look for the word "whole" as the first word in the ingredient list. Shopping Buy products labeled as "low-sodium" or "no salt added." Buy fresh foods. Avoid canned foods and pre-made or frozen meals. Cooking Avoid adding salt when cooking. Use salt-free seasonings or herbs instead of table salt or sea salt. Check with your health care provider or pharmacist before using salt substitutes. Do not fry foods. Cook foods using healthy methods such as baking, boiling, grilling, roasting, and broiling instead. Cook with heart-healthy oils, such as olive, canola, avocado, soybean, or sunflower oil. Meal planning  Eat a balanced diet that includes: 4 or more servings of fruits and 4 or more servings of vegetables each day. Try to fill one-half of your plate with fruits and vegetables. 6-8 servings of whole grains each day. Less than 6 oz (170 g) of lean meat, poultry, or fish each day. A 3-oz (85-g) serving of meat is about the same size as  a deck of cards. One egg equals 1 oz (28 g). 2-3 servings of low-fat dairy each day. One serving is 1 cup (237 mL). 1 serving of nuts, seeds, or beans 5 times each week. 2-3 servings of heart-healthy fats. Healthy fats called omega-3 fatty acids are found in foods such as walnuts, flaxseeds, fortified milks, and eggs. These fats are also found in cold-water fish, such as sardines, salmon, and mackerel. Limit how much you eat of: Canned or prepackaged foods. Food that is high  in trans fat, such as some fried foods. Food that is high in saturated fat, such as fatty meat. Desserts and other sweets, sugary drinks, and other foods with added sugar. Full-fat dairy products. Do not salt foods before eating. Do not eat more than 4 egg yolks a week. Try to eat at least 2 vegetarian meals a week. Eat more home-cooked food and less restaurant, buffet, and fast food. Lifestyle When eating at a restaurant, ask that your food be prepared with less salt or no salt, if possible. If you drink alcohol: Limit how much you use to: 0-1 drink a day for women who are not pregnant. 0-2 drinks a day for men. Be aware of how much alcohol is in your drink. In the [place] one drink equals one 12 oz bottle of beer (355 mL), one 5 oz glass of wine (148 mL), or one 1 oz glass of hard liquor (44 mL). General information Avoid eating more than 2,300 mg of salt a day. If you have hypertension, you may need to reduce your sodium intake to 1,500 mg a day. Work with your health care provider to maintain a healthy body weight or to lose weight. Ask what an ideal weight is for you. Get at least 30 minutes of exercise that causes your heart to beat faster (aerobic exercise) most days of the week. Activities may include walking, swimming, or biking. Work with your health care provider or dietitian to adjust your eating plan to your individual calorie needs. What foods should I eat? Fruits All fresh, dried, or frozen fruit. Canned fruit in natural juice (without added sugar). Vegetables Fresh or frozen vegetables (raw, steamed, roasted, or grilled). Low-sodium or reduced-sodium tomato and vegetable juice. Low-sodium or reduced-sodium tomato sauce and tomato paste. Low-sodium or reduced-sodium canned vegetables. Grains Whole-grain or whole-wheat bread. Whole-grain or whole-wheat pasta. Brown rice. [place]. Bulgur. Whole-grain and low-sodium cereals. Pita bread. Low-fat, low-sodium crackers.  Whole-wheat flour tortillas. Meats and other proteins Skinless chicken or [place]. Ground chicken or [place]. Pork with fat trimmed off. Fish and seafood. Egg whites. Dried beans, peas, or lentils. Unsalted nuts, nut butters, and seeds. Unsalted canned beans. Lean cuts of beef with fat trimmed off. Low-sodium, lean precooked or cured meat, such as sausages or meat loaves. Dairy Low-fat (1%) or fat-free (skim) milk. Reduced-fat, low-fat, or fat-free cheeses. Nonfat, low-sodium ricotta or cottage cheese. Low-fat or nonfat yogurt. Low-fat, low-sodium cheese. Fats and oils Soft margarine without trans fats. Vegetable oil. Reduced-fat, low-fat, or light mayonnaise and salad dressings (reduced-sodium). Canola, safflower, olive, avocado, soybean, and sunflower oils. Avocado. Seasonings and condiments Herbs. Spices. Seasoning mixes without salt. Other foods Unsalted popcorn and pretzels. Fat-free sweets. The items listed above may not be a complete list of foods and beverages you can eat. Contact a dietitian for more information. What foods should I avoid? Fruits Canned fruit in a light or heavy syrup. Fried fruit. Fruit in cream or butter sauce. Vegetables Creamed or fried vegetables. Vegetables  in a cheese sauce. Regular canned vegetables (not low-sodium or reduced-sodium). Regular canned tomato sauce and paste (not low-sodium or reduced-sodium). Regular tomato and vegetable juice (not low-sodium or reduced-sodium). Angie Fava. Olives. Grains Baked goods made with fat, such as croissants, muffins, or some breads. Dry pasta or rice meal packs. Meats and other proteins Fatty cuts of meat. Ribs. Fried meat. Berniece Salines. Bologna, salami, and other precooked or cured meats, such as sausages or meat loaves. Fat from the back of a pig (fatback). Bratwurst. Salted nuts and seeds. Canned beans with added salt. Canned or smoked fish. Whole eggs or egg yolks. Chicken or [place] with skin. Dairy Whole or 2% milk, cream, and  half-and-half. Whole or full-fat cream cheese. Whole-fat or sweetened yogurt. Full-fat cheese. Nondairy creamers. Whipped toppings. Processed cheese and cheese spreads. Fats and oils Butter. Stick margarine. Lard. Shortening. Ghee. Bacon fat. Tropical oils, such as coconut, palm kernel, or palm oil. Seasonings and condiments Onion salt, garlic salt, seasoned salt, table salt, and sea salt. Worcestershire sauce. Tartar sauce. Barbecue sauce. Teriyaki sauce. Soy sauce, including reduced-sodium. Steak sauce. Canned and packaged gravies. Fish sauce. Oyster sauce. Cocktail sauce. Store-bought horseradish. Ketchup. Mustard. Meat flavorings and tenderizers. Bouillon cubes. Hot sauces. Pre-made or packaged marinades. Pre-made or packaged taco seasonings. Relishes. Regular salad dressings. Other foods Salted popcorn and pretzels. The items listed above may not be a complete list of foods and beverages you should avoid. Contact a dietitian for more information. Where to find more information National Heart, Lung, and Blood Institute: https://wilson-eaton.com/ American Heart Association: www.heart.org Academy of Nutrition and Dietetics: www.eatright.[place]: www.kidney.org Summary The DASH eating plan is a healthy eating plan that has been shown to reduce high blood pressure (hypertension). It may also reduce your risk for type 2 diabetes, heart disease, and stroke. When on the DASH eating plan, aim to eat more fresh fruits and vegetables, whole grains, lean proteins, low-fat dairy, and heart-healthy fats. With the DASH eating plan, you should limit salt (sodium) intake to 2,300 mg a day. If you have hypertension, you may need to reduce your sodium intake to 1,500 mg a day. Work with your health care provider or dietitian to adjust your eating plan to your individual calorie needs. This information is not intended to replace advice given to you by your health care provider. Make sure you  discuss any questions you have with your health care provider. Document Revised: 02/18/2019 Document Reviewed: 02/18/2019 Elsevier Patient Education  [place] refers to food and lifestyle choices that are based on the traditions of countries located on the [employer]. It focuses on eating more fruits, vegetables, whole grains, beans, nuts, seeds, and heart-healthy fats, and eating less dairy, meat, eggs, and processed foods with added sugar, salt, and fat. This way of eating has been shown to help prevent certain conditions and improve outcomes for people who have chronic diseases, like kidney disease and heart disease. What are tips for following this plan? Reading food labels Check the serving size of packaged foods. For foods such as rice and pasta, the serving size refers to the amount of cooked product, not dry. Check the total fat in packaged foods. Avoid foods that have saturated fat or trans fats. Check the ingredient list for added sugars, such as corn syrup. Shopping  Buy a variety of foods that offer a balanced diet, including: Fresh fruits and vegetables (produce). Grains, beans, nuts, and seeds. Some of these  may be available in unpackaged forms or large amounts (in bulk). Fresh seafood. Poultry and eggs. Low-fat dairy products. Buy whole ingredients instead of prepackaged foods. Buy fresh fruits and vegetables in-season from local farmers markets. Buy plain frozen fruits and vegetables. If you do not have access to quality fresh seafood, buy precooked frozen shrimp or canned fish, such as tuna, salmon, or sardines. Stock your pantry so you always have certain foods on hand, such as olive oil, canned tuna, canned tomatoes, rice, pasta, and beans. Cooking Cook foods with extra-virgin olive oil instead of using butter or other vegetable oils. Have meat as a side dish, and have vegetables or grains as your main dish.  This means having meat in small portions or adding small amounts of meat to foods like pasta or stew. Use beans or vegetables instead of meat in common dishes like chili or lasagna. Experiment with different cooking methods. Try roasting, broiling, steaming, and sauting vegetables. Add frozen vegetables to soups, stews, pasta, or rice. Add nuts or seeds for added healthy fats and plant protein at each meal. You can add these to yogurt, salads, or vegetable dishes. Marinate fish or vegetables using olive oil, lemon juice, garlic, and fresh herbs. Meal planning Plan to eat one vegetarian meal one day each week. Try to work up to two vegetarian meals, if possible. Eat seafood two or more times a week. Have healthy snacks readily available, such as: Vegetable sticks with hummus. Greek yogurt. Fruit and nut trail mix. Eat balanced meals throughout the week. This includes: Fruit: 2-3 servings a day. Vegetables: 4-5 servings a day. Low-fat dairy: 2 servings a day. Fish, poultry, or lean meat: 1 serving a day. Beans and legumes: 2 or more servings a week. Nuts and seeds: 1-2 servings a day. Whole grains: 6-8 servings a day. Extra-virgin olive oil: 3-4 servings a day. Limit red meat and sweets to only a few servings a month. Lifestyle  Cook and eat meals together with your family, when possible. Drink enough fluid to keep your urine pale yellow. Be physically active every day. This includes: Aerobic exercise like running or swimming. Leisure activities like gardening, walking, or housework. Get 7-8 hours of sleep each night. If recommended by your health care provider, drink red wine in moderation. This means 1 glass a day for nonpregnant women and 2 glasses a day for men. A glass of wine equals 5 oz (150 mL). What foods should I eat? Fruits Apples. Apricots. Avocado. Berries. Bananas. Cherries. Dates. Figs. Grapes. Lemons. Melon. Oranges. Peaches. Plums.  Pomegranate. Vegetables Artichokes. Beets. Broccoli. Cabbage. Carrots. Eggplant. Green beans. Chard. Kale. Spinach. Onions. Leeks. Peas. Squash. Tomatoes. Peppers. Radishes. Grains Whole-grain pasta. Brown rice. Bulgur wheat. Polenta. Couscous. Whole-wheat bread. [place]. Meats and other proteins Beans. Almonds. Sunflower seeds. Pine nuts. Peanuts. [place]. Salmon. Scallops. Shrimp. [place]. Tilapia. Clams. Oysters. Eggs. Poultry without skin. Dairy Low-fat milk. Cheese. Greek yogurt. Fats and oils Extra-virgin olive oil. Avocado oil. Grapeseed oil. Beverages Water. Red wine. Herbal tea. Sweets and desserts Greek yogurt with honey. Baked apples. Poached pears. Trail mix. Seasonings and condiments Basil. Cilantro. Coriander. Cumin. Mint. Parsley. Sage. Rosemary. Tarragon. Garlic. Oregano. Thyme. Pepper. Balsamic vinegar. Tahini. Hummus. Tomato sauce. Olives. Mushrooms. The items listed above may not be a complete list of foods and beverages you can eat. Contact a dietitian for more information. What foods should I limit? This is a list of foods that should be eaten rarely or only on special occasions. Fruits Fruit canned  in syrup. Vegetables Deep-fried potatoes (french fries). Grains Prepackaged pasta or rice dishes. Prepackaged cereal with added sugar. Prepackaged snacks with added sugar. Meats and other proteins Beef. Pork. Lamb. Poultry with skin. Hot dogs. Berniece Salines. Dairy Ice cream. Sour cream. Whole milk. Fats and oils Butter. Canola oil. Vegetable oil. Beef fat (tallow). Lard. Beverages Juice. Sugar-sweetened soft drinks. Beer. Liquor and spirits. Sweets and desserts Cookies. Cakes. Pies. Candy. Seasonings and condiments Mayonnaise. Pre-made sauces and marinades. The items listed above may not be a complete list of foods and beverages you should limit. Contact a dietitian for more information. Summary The Mediterranean diet includes both food and lifestyle choices. Eat a  variety of fresh fruits and vegetables, beans, nuts, seeds, and whole grains. Limit the amount of red meat and sweets that you eat. If recommended by your health care provider, drink red wine in moderation. This means 1 glass a day for nonpregnant women and 2 glasses a day for men. A glass of wine equals 5 oz (150 mL). This information is not intended to replace advice given to you by your health care provider. Make sure you discuss any questions you have with your health care provider. Document Revised: 04/22/2019 Document Reviewed: 02/17/2019 Elsevier Patient Education  [place]

## 2021-08-27 ENCOUNTER — Ambulatory Visit (INDEPENDENT_AMBULATORY_CARE_PROVIDER_SITE_OTHER): Payer: Medicare HMO

## 2021-08-27 DIAGNOSIS — I420 Dilated cardiomyopathy: Secondary | ICD-10-CM | POA: Diagnosis not present

## 2021-08-27 LAB — CUP PACEART REMOTE DEVICE CHECK
Battery Remaining Longevity: 134 mo
Battery Voltage: 3.11 V
Brady Statistic RV Percent Paced: 0 %
Date Time Interrogation Session: 20230530033622
HighPow Impedance: 74 Ohm
Implantable Lead Implant Date: 20230220
Implantable Lead Location: 753860
Implantable Lead Model: 6935
Implantable Pulse Generator Implant Date: 20230220
Lead Channel Impedance Value: 437 Ohm
Lead Channel Impedance Value: 494 Ohm
Lead Channel Pacing Threshold Amplitude: 0.375 V
Lead Channel Pacing Threshold Pulse Width: 0.4 ms
Lead Channel Sensing Intrinsic Amplitude: 7.5 mV
Lead Channel Sensing Intrinsic Amplitude: 7.5 mV
Lead Channel Setting Pacing Amplitude: 2 V
Lead Channel Setting Pacing Pulse Width: 0.4 ms
Lead Channel Setting Sensing Sensitivity: 0.3 mV

## 2021-08-30 ENCOUNTER — Other Ambulatory Visit: Payer: Medicare HMO | Admitting: *Deleted

## 2021-08-30 DIAGNOSIS — I5042 Chronic combined systolic (congestive) and diastolic (congestive) heart failure: Secondary | ICD-10-CM | POA: Diagnosis not present

## 2021-08-30 DIAGNOSIS — E1159 Type 2 diabetes mellitus with other circulatory complications: Secondary | ICD-10-CM

## 2021-08-30 DIAGNOSIS — I420 Dilated cardiomyopathy: Secondary | ICD-10-CM

## 2021-08-31 LAB — BASIC METABOLIC PANEL
BUN/Creatinine Ratio: 8 — ABNORMAL LOW (ref 10–24)
BUN: 10 mg/dL (ref 8–27)
CO2: 23 mmol/L (ref 20–29)
Calcium: 9.1 mg/dL (ref 8.6–10.2)
Chloride: 102 mmol/L (ref 96–106)
Creatinine, Ser: 1.32 mg/dL — ABNORMAL HIGH (ref 0.76–1.27)
Glucose: 269 mg/dL — ABNORMAL HIGH (ref 70–99)
Potassium: 3.9 mmol/L (ref 3.5–5.2)
Sodium: 143 mmol/L (ref 134–144)
eGFR: 60 mL/min/{1.73_m2} (ref >59)

## 2021-09-02 NOTE — Progress Notes (Signed)
Pt has been made aware of normal result and verbalized understanding.  jw

## 2021-09-03 ENCOUNTER — Other Ambulatory Visit: Payer: Self-pay | Admitting: *Deleted

## 2021-09-03 MED ORDER — LOSARTAN POTASSIUM 50 MG PO TABS: 50.0000 mg | ORAL_TABLET | Freq: Every day | ORAL | 3 refills | Status: DC

## 2021-09-11 ENCOUNTER — Ambulatory Visit: Payer: Medicare HMO | Admitting: Internal Medicine

## 2021-09-11 ENCOUNTER — Encounter: Payer: Self-pay | Admitting: Internal Medicine

## 2021-09-11 VITALS — BP 138/94 | HR 89 | Ht 73.0 in | Wt 300.2 lb

## 2021-09-11 DIAGNOSIS — I5042 Chronic combined systolic (congestive) and diastolic (congestive) heart failure: Secondary | ICD-10-CM

## 2021-09-11 DIAGNOSIS — Z95810 Presence of automatic (implantable) cardiac defibrillator: Secondary | ICD-10-CM | POA: Diagnosis not present

## 2021-09-11 NOTE — Patient Instructions (Signed)
Medication Instructions:  Your physician recommends that you continue on your current medications as directed. Please refer to the Current Medication list given to you today.  Labwork: None ordered.  Testing/Procedures: None ordered.  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 This monitoring reduces the number of office visits required to check your device to one time per year. It allows [place] to keep an eye on the functioning of your device to ensure it is working properly. You are scheduled for a device check from home on 11/26/2021. You may send your transmission at any time that day. If you have a wireless device, the transmission will be sent automatically. After your physician reviews your transmission, you will receive a postcard with your next transmission date.  Any Other Special Instructions Will Be Listed Below (If Applicable).  If you need a refill on your cardiac medications before your next appointment, please call your pharmacy.   Important Information About Sugar

## 2021-09-11 NOTE — Progress Notes (Signed)
HPI [name] returns today for followup. He is a pleasant 66 yo Barnes with a DCM, HTN, dyslipidemia and obesity. He is s/p ICD insertion for primary prevention. He has done well in the interim with no ICD therapies. 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  Allergies  Allergen Reactions   Influenza Vaccines Swelling and Other (See Comments)    Reports of swelling of the arm and then was "sick" for 10 days     Current Outpatient Medications  Medication Sig Dispense Refill   Accu-Chek Softclix Lancets lancets Check blood sugar 2 times daily 100 each 3   atorvastatin (LIPITOR) 20 MG tablet TAKE 1 TABLET BY MOUTH EVERY DAY 30 tablet 0   Blood Glucose Monitoring Suppl (ACCU-CHEK GUIDE) w/Device KIT Check blood sugar 2 times daily 1 kit 0   Blood Pressure Monitoring (BLOOD PRESSURE CUFF) MISC Use as directed 1 each 0   carvedilol (COREG) 6.25 MG tablet TAKE 1 TABLET BY MOUTH TWICE A DAY WITH MEALS 60 tablet 0   digoxin (LANOXIN) 0.125 MG tablet TAKE 1 TABLET BY MOUTH EVERY DAY 90 tablet 1   furosemide (LASIX) 20 MG tablet Take 1 tablet (20 mg total) by mouth daily. 90 tablet 1   glucose blood (ACCU-CHEK GUIDE) test strip Use to check blood sugar 2 times daily 100 each 3   glucose blood (ONETOUCH VERIO) test strip USE AS INSTRUCTED TO CHECK BLOOD SUGAR 2 TIMES A DAY. 200 each 1   Insulin Syringe-Needle U-100 (INSULIN SYRINGE 1CC/31GX5/16") 31G X 5/16" 1 ML MISC Use 3 needles per day 100 each 3   lithium carbonate 300 MG capsule Take 3 capsules (900 mg total) by mouth daily.     losartan (COZAAR) 50 MG tablet Take 1 tablet (50 mg total) by mouth daily. 90 tablet 3   NOVOLIN 70/30 (70-30) 100 UNIT/ML injection INJECT 40 UNITS INTO THE SKIN 2 (TWO) TIMES DAILY WITH A MEAL. (Patient taking differently: Inject 40 Units into the skin 2 (two) times daily with a meal. 60 units am and 40units at dinner) 20 mL 3   QUEtiapine (SEROQUEL) 200 MG tablet Take 450 mg by mouth at bedtime. Per patient taking  500 mg     cholecalciferol (VITAMIN D3) 25 MCG (1000 UNIT) tablet Take 1,000 Units by mouth daily. (Patient not taking: Reported on 09/11/2021)     omeprazole (PRILOSEC) 20 MG capsule Take 1 capsule (20 mg total) by mouth daily. (Patient not taking: Reported on 09/11/2021) 30 capsule 0   Semaglutide, 1 MG/DOSE, (OZEMPIC, 1 MG/DOSE,) 4 MG/3ML SOPN Inject 1 mg weekly (Patient not taking: Reported on 09/11/2021) 3 mL 3   sildenafil (VIAGRA) 50 MG tablet Take 1 tablet (50 mg total) by mouth daily as needed for erectile dysfunction. Do not take more then once in 24 hours. (Patient not taking: Reported on 09/11/2021) 10 tablet 3   No current facility-administered medications for this visit.     Past Medical History:  Diagnosis Date   Bipolar II disorder - Managed by Matthew Barnes [phone]) 05/03/2013   Diabetes mellitus    Dysphagia    Hyperlipidemia    Hypertension    Unspecified hypothyroidism 05/03/2013    ROS:   All systems reviewed and negative except as noted in the HPI.   Past Surgical History:  Procedure Laterality Date   ICD IMPLANT N/A 05/20/2021   Procedure: ICD IMPLANT;  Surgeon: Matthew Lance, MD;  Location: [facility];  Service: Cardiovascular;  Laterality: N/A;   KNEE SURGERY     scope on left knee   RIGHT/LEFT HEART CATH AND CORONARY ANGIOGRAPHY N/A 08/07/2020   Procedure: RIGHT/LEFT HEART CATH AND CORONARY ANGIOGRAPHY;  Surgeon: Matthew Man, MD;  Location: [facility];  Service: Cardiovascular;  Laterality: N/A;     Family History  Problem Relation Age of Onset   Diabetes Mother    Hypertension Mother    Heart disease Mother    Heart disease Father    Heart attack Father 20   Diabetes Father    Hypertension Father    Depression Father    Diabetes Sister    Depression Paternal Grandmother    Depression Other      Social History   Socioeconomic History   Marital status: Single    Spouse name: Not on file   Number of children:  Not on file   Years of education: Not on file   Highest education level: Not on file  Occupational History   Not on file  Tobacco Use   Smoking status: Never   Smokeless tobacco: Never  Vaping Use   Vaping Use: Never used  Substance and Sexual Activity   Alcohol use: No   Drug use: No   Sexual activity: Not Currently  Other Topics Concern   Not on file  Social History Narrative   Work or School: on disability for knee arthritis      Home Situation: lives alone      Spiritual Beliefs:Christian      Lifestyle:no regular exercising; diet not great            Social Determinants of Health   Financial Resource Strain: Low Risk  (02/25/2021)   Overall Financial Resource Strain (CARDIA)    Difficulty of Paying Living Expenses: Not hard at all  Food Insecurity: No Food Insecurity (02/25/2021)   Hunger Vital Sign    Worried About Running Out of Food in the Last Year: Never true    Ran Out of Food in the Last Year: Never true  Transportation Needs: No Transportation Needs (02/25/2021)   PRAPARE - Hydrologist (Medical): No    Lack of Transportation (Non-Medical): No  Physical Activity: Inactive (02/25/2021)   Exercise Vital Sign    Days of Exercise per Week: 0 days    Minutes of Exercise per Session: 0 min  Stress: No Stress Concern Present (02/25/2021)   [place]    Feeling of Stress : Not at all  Social Connections: Socially Isolated (02/15/2020)   Social Connection and Isolation Panel [NHANES]    Frequency of Communication with Friends and Family: Three times a week    Frequency of Social Gatherings with Friends and Family: Never    Attends Religious Services: Never    Marine scientist or Organizations: No    Attends Archivist Meetings: Never    Marital Status: Never married  Intimate Partner Violence: Not At Risk (02/15/2020)   Humiliation, Afraid, Rape,  and Kick questionnaire    Fear of Current or Ex-Partner: No    Emotionally Abused: No    Physically Abused: No    Sexually Abused: No     BP (!) 138/94   Pulse 89   Ht _0  (1.854 m)   Wt (!) 300 lb 3.2 oz (136.2 kg)   SpO2 94%   BMI 39.61 kg/m   Physical  Exam:  Well appearing NAD HEENT: Unremarkable Neck:  No JVD, no thyromegally Lymphatics:  No adenopathy Back:  No CVA tenderness Lungs:  Clear with no wheezes HEART:  Regular rate rhythm, no murmurs, no rubs, no clicks Abd:  soft, positive bowel sounds, no organomegally, no rebound, no guarding Ext:  2 plus pulses, no edema, no cyanosis, no clubbing Skin:  No rashes no nodules Neuro:  CN II through XII intact, motor grossly intact  EKG - nsr with LVH  DEVICE  Normal device function.  See PaceArt for details.   Assess/Plan:  Chronic systolic heart failure - his symptoms are class 2. He will continue his current meds.  Obesity - I encouraged him to lose weight.   Carleene Overlie Shaili Donalson,MD

## 2021-09-12 NOTE — Progress Notes (Signed)
Remote ICD transmission.   

## 2021-09-15 ENCOUNTER — Other Ambulatory Visit: Payer: Self-pay | Admitting: Endocrinology

## 2021-09-25 DIAGNOSIS — F3132 Bipolar disorder, current episode depressed, moderate: Secondary | ICD-10-CM | POA: Diagnosis not present

## 2021-10-07 ENCOUNTER — Other Ambulatory Visit: Payer: Self-pay | Admitting: *Deleted

## 2021-10-07 DIAGNOSIS — E7800 Pure hypercholesterolemia, unspecified: Secondary | ICD-10-CM

## 2021-10-08 MED ORDER — ATORVASTATIN CALCIUM 20 MG PO TABS: 20.0000 mg | ORAL_TABLET | Freq: Every day | ORAL | 0 refills | Status: DC

## 2021-10-14 ENCOUNTER — Telehealth: Payer: Self-pay | Admitting: Pharmacist

## 2021-10-14 NOTE — Chronic Care Management (AMB) (Signed)
Chronic Care Management Pharmacy Assistant   Name: Matthew Barnes  MRN: [id number] DOB: 1955-12-15  Reason for Encounter: Disease State / Hypertension  and Diabetes Assessment Call   Conditions to be addressed/monitored: HTN  Recent office visits:  None  Recent consult visits:  09/11/2021 [name] MD (cardiology) - Patient was seen for Chronic combined systolic and diastolic heart failure and an additional issue. No medication changes. Follow up in 1 year.   08/23/2021 Christen Bame NP (cardiology) -  Patient was seen for Dilated cardiomyopathy and additional issues. Started Entresto 49/51 mg twice daily. Discontinued Losartan. Follow up in 2 months.   08/21/2021 Elayne Snare MD (endocrinology) - Patient was seen for Uncontrolled type 2 diabetes mellitus with hyperglycemia, with long-term current use of insulin and additional issues. Increased Semaglutide to 31m/3ml inject 1 mg weekly. Follow up in 10 weeks.   08/15/2021 Masoud Hejazi (psychiatry) - Patient was seen for Bipolar disorder, current episode depressed, moderate. No additional chart notes.   07/24/2021 Masoud Hejazi (psychiatry) - Patient was seen for Bipolar disorder, current episode depressed, moderate. No additional chart notes.   Hospital visits:  None  Medications: Outpatient Encounter Medications as of 10/14/2021  Medication Sig   Accu-Chek Softclix Lancets lancets Check blood sugar 2 times daily   atorvastatin (LIPITOR) 20 MG tablet Take 1 tablet (20 mg total) by mouth daily.   Blood Glucose Monitoring Suppl (ACCU-CHEK GUIDE) w/Device KIT Check blood sugar 2 times daily   Blood Pressure Monitoring (BLOOD PRESSURE CUFF) MISC Use as directed   carvedilol (COREG) 6.25 MG tablet TAKE 1 TABLET BY MOUTH TWICE A DAY WITH MEALS   cholecalciferol (VITAMIN D3) 25 MCG (1000 UNIT) tablet Take 1,000 Units by mouth daily. (Patient not taking: Reported on 09/11/2021)   digoxin (LANOXIN) 0.125 MG tablet TAKE 1 TABLET BY  MOUTH EVERY DAY   furosemide (LASIX) 20 MG tablet Take 1 tablet (20 mg total) by mouth daily.   glucose blood (ACCU-CHEK GUIDE) test strip Use to check blood sugar 2 times daily   glucose blood (ONETOUCH VERIO) test strip USE AS INSTRUCTED TO CHECK BLOOD SUGAR 2 TIMES A DAY.   Insulin Syringe-Needle U-100 (INSULIN SYRINGE 1CC/31GX5/16") 31G X 5/16" 1 ML MISC Use 3 needles per day   lithium carbonate 300 MG capsule Take 3 capsules (900 mg total) by mouth daily.   losartan (COZAAR) 50 MG tablet Take 1 tablet (50 mg total) by mouth daily.   NOVOLIN 70/30 (70-30) 100 UNIT/ML injection INJECT 40 UNITS INTO THE SKIN 2 (TWO) TIMES DAILY WITH A MEAL.   omeprazole (PRILOSEC) 20 MG capsule Take 1 capsule (20 mg total) by mouth daily. (Patient not taking: Reported on 09/11/2021)   QUEtiapine (SEROQUEL) 200 MG tablet Take 450 mg by mouth at bedtime. Per patient taking 500 mg   Semaglutide, 1 MG/DOSE, (OZEMPIC, 1 MG/DOSE,) 4 MG/3ML SOPN Inject 1 mg weekly (Patient not taking: Reported on 09/11/2021)   sildenafil (VIAGRA) 50 MG tablet Take 1 tablet (50 mg total) by mouth daily as needed for erectile dysfunction. Do not take more then once in 24 hours. (Patient not taking: Reported on 09/11/2021)   No facility-administered encounter medications on file as of 10/14/2021.  Fill History: ATORVASTATIN 20 MG TABLET 09/09/2021 30   DIGOXIN 125 MCG TABLET 08/01/2021 90   FUROSEMIDE 20 MG TABLET 05/05/2021 90   NOVOLIN 70-30 100 UNIT/ML VIAL 09/18/2021 25   LITHIUM CARBONATE 300 MG CAP 07/08/2021 90   OMEPRAZOLE DR 20 MG  CAPSULE 07/12/2021 30   QUETIAPINE FUMARATE 200 MG TAB 07/25/2021 90   ENTRESTO 49 MG-51 MG TABLET 08/27/2021 30   OZEMPIC 1 MG/DOSE (4 MG/3 ML) 08/23/2021 30   SERTRALINE HCL 100 MG TABLET 09/25/2021 30   Reviewed chart prior to disease state call. Spoke with patient regarding BP  Recent Office Vitals: BP Readings from Last 3 Encounters:  09/11/21 (!) 138/94  08/23/21 130/80  08/21/21  (!) 150/86   Pulse Readings from Last 3 Encounters:  09/11/21 89  08/23/21 82  08/21/21 (!) 49    Wt Readings from Last 3 Encounters:  09/11/21 (!) 300 lb 3.2 oz (136.2 kg)  08/23/21 (!) 302 lb (137 kg)  08/21/21 (!) 302 lb (137 kg)     Kidney Function Lab Results  Component Value Date/Time   CREATININE 1.32 (H) 08/30/2021 01:01 PM   CREATININE 1.40 08/15/2021 09:06 AM   GFR 52.77 (L) 08/15/2021 09:06 AM   GFRNONAA >60 07/07/2021 04:03 PM   GFRAA >90 04/18/2011 08:00 PM       Latest Ref Rng & Units 08/30/2021    1:01 PM 08/15/2021    9:06 AM 07/07/2021    4:03 PM  BMP  Glucose 70 - 99 mg/dL 269  155  180   BUN 8 - 27 mg/dL '10  14  16   ' Creatinine 0.76 - 1.27 mg/dL 1.32  1.40  1.30   BUN/Creat Ratio 10 - 24 8     Sodium 134 - 144 mmol/L 143  140  142   Potassium 3.5 - 5.2 mmol/L 3.9  4.0  3.9   Chloride 96 - 106 mmol/L 102  103  108   CO2 20 - 29 mmol/L '23  31  26   ' Calcium 8.6 - 10.2 mg/dL 9.1  9.1  8.9   Recent Relevant Labs: Lab Results  Component Value Date/Time   HGBA1C 6.9 (A) 08/21/2021 11:05 AM   HGBA1C 7.5 (H) 04/09/2021 10:55 AM   HGBA1C 7.3 (A) 11/01/2020 01:10 PM   HGBA1C 8.3 (H) 07/31/2020 04:09 AM   MICROALBUR 1.9 02/28/2021 08:52 AM   MICROALBUR 1.1 07/14/2019 03:14 PM    Kidney Function Lab Results  Component Value Date/Time   CREATININE 1.32 (H) 08/30/2021 01:01 PM   CREATININE 1.40 08/15/2021 09:06 AM   GFR 52.77 (L) 08/15/2021 09:06 AM   GFRNONAA >60 07/07/2021 04:03 PM   GFRAA >90 04/18/2011 08:00 PM    Current antihyperglycemic regimen:  Ozempic 1 mg  patient states this medication discontinued due to cost  Novolin 70/30 inject 60 units daily in the morning and 50 units in the evening  What recent interventions/DTPs have been made to improve glycemic control: No recent interventions  Patient denies hypoglycemic symptoms, including None  Patient denies hyperglycemic symptoms, including none  How often are you checking your blood sugar?  Patient states he is checking blood sugars twice daily at various times, sometimes fasting and sometimes not.   What are your blood sugars ranging?  Patient states he doesn't have his log with him but his blood sugars are always between 110 and 130. He states on occasion when he has eaten something he shouldn't be eating it has gone up as high as 350 but, that is not often.   During the week, how often does your blood glucose drop below 70? Patient denies  Are you checking your feet daily/regularly? Patient is checking his feet daily   Current antihypertensive regimen:  Entresto 49/51 mg discontinued due  to cost Losartan 50 mg daily (patient is now taking)  How often are you checking your Blood Pressure? Patient denies checking blood pressures, he states he does not have a monitor at home.   Current home BP readings: Patient is not checking  What recent interventions/DTPs have been made by any provider to improve Blood Pressure control since last CPP Visit: Started Entresto and discontinued Losartan on 08/23/21, patient states due to cost Entresto was discontinued and he has restarted Losartan.   Any recent hospitalizations or ED visits since last visit with CPP? No recent hospital visit  What diet changes have been made to improve Blood Pressure Control?  Patient follows no specific diet Breakfast - patient will have two sausage biscuit Lunch - patient will have a [place] and cheese sandwich  Dinner - patient will have variety with some sort of meat and whatever he feels like   What exercise is being done to improve your Blood Pressure Control?  Patient is working out 20-30 minutes daily  Adherence Review: Is the patient currently on a STATIN medication? Yes Is the patient currently on ACE/ARB medication? No Does the patient have >5 day gap between last estimated fill dates? No  Care Gaps: AWV scheduled for 03/10/2022 Last BP - 138/94 on 09/11/2021 Last A1C - 6.9 on  08/21/2021 Cologuard - overdue Covid booster - overdue Foot exam - overdue Eye exam - overdue Pneumonia vaccine - overdue  Star Rating Drugs: Atorvastatin 20 mg - last filled 09/09/2021 30 [facility] Ozempic 1 mg - per patient discontinued due to cost Losartan 50 mg - last filled 08/19/2021 30 [facility] at [facility] (patient recently restarted this prescription)  [place] Pharmacist Assistant [phone]

## 2021-10-22 ENCOUNTER — Other Ambulatory Visit: Payer: Self-pay | Admitting: Family

## 2021-10-22 DIAGNOSIS — E7800 Pure hypercholesterolemia, unspecified: Secondary | ICD-10-CM

## 2021-10-25 ENCOUNTER — Other Ambulatory Visit: Payer: Self-pay | Admitting: Nurse Practitioner

## 2021-10-28 ENCOUNTER — Other Ambulatory Visit (INDEPENDENT_AMBULATORY_CARE_PROVIDER_SITE_OTHER): Payer: Medicare HMO

## 2021-10-28 DIAGNOSIS — Z794 Long term (current) use of insulin: Secondary | ICD-10-CM

## 2021-10-28 DIAGNOSIS — E782 Mixed hyperlipidemia: Secondary | ICD-10-CM | POA: Diagnosis not present

## 2021-10-28 DIAGNOSIS — E1165 Type 2 diabetes mellitus with hyperglycemia: Secondary | ICD-10-CM | POA: Diagnosis not present

## 2021-10-28 LAB — COMPREHENSIVE METABOLIC PANEL
ALT: 17 U/L (ref 0–53)
AST: 21 U/L (ref 0–37)
Albumin: 4.2 g/dL (ref 3.5–5.2)
Alkaline Phosphatase: 53 U/L (ref 39–117)
BUN: 12 mg/dL (ref 6–23)
CO2: 29 mEq/L (ref 19–32)
Calcium: 9.2 mg/dL (ref 8.4–10.5)
Chloride: 105 mEq/L (ref 96–112)
Creatinine, Ser: 1.34 mg/dL (ref 0.40–1.50)
GFR: 55.54 mL/min — ABNORMAL LOW (ref >60.00)
Glucose, Bld: 148 mg/dL — ABNORMAL HIGH (ref 70–99)
Potassium: 3.8 mEq/L (ref 3.5–5.1)
Sodium: 142 mEq/L (ref 135–145)
Total Bilirubin: 0.6 mg/dL (ref 0.2–1.2)
Total Protein: 6.9 g/dL (ref 6.0–8.3)

## 2021-10-28 LAB — LIPID PANEL
Cholesterol: 91 mg/dL (ref 0–200)
HDL: 23.5 mg/dL — ABNORMAL LOW (ref >39.00)
LDL Cholesterol: 38 mg/dL (ref 0–99)
NonHDL: 67.11
Total CHOL/HDL Ratio: 4
Triglycerides: 144 mg/dL (ref 0.0–149.0)
VLDL: 28.8 mg/dL (ref 0.0–40.0)

## 2021-10-28 LAB — HEMOGLOBIN A1C: Hgb A1c MFr Bld: 8.3 % — ABNORMAL HIGH (ref 4.6–6.5)

## 2021-10-28 NOTE — Chronic Care Management (AMB) (Signed)
Spoke with [place] at [employer], she states application was never received. Spoke with patient, he states he decided not to mess with the pap, he states he is no longer using Ozempic, he is using Novolin 70/30.

## 2021-10-31 ENCOUNTER — Ambulatory Visit: Payer: Medicare HMO | Admitting: Cardiology

## 2021-10-31 ENCOUNTER — Ambulatory Visit: Payer: Medicare HMO | Admitting: Endocrinology

## 2021-10-31 NOTE — Progress Notes (Incomplete)
Cardiology Office Note:    Date:  10/31/2021   ID:  Garv, Kuechle [birth year], MRN [id number]  PCP:  Caren Macadam, MD (Inactive)   [facility] Providers Cardiologist:  Quay Burow, MD Electrophysiologist:  [name], MD     Referring MD: Caren Macadam, MD    History of Present Illness:    Matthew Barnes is a 66 y.o. male here for the evaluation of hypertension, hyperlipidemia, diabetes last hemoglobin A1c 7.3, with combined systolic and diastolic heart failure discovered on heart catheterization 08/07/2020.  He had moderate pulmonary hypertension secondary.  37 mmHg.  His wedge pressure was 32, LVEDP was 30 mmHg.  He did not have coronary artery disease on cardiac catheterization personally reviewed.  EF was 20 to 25%.  He had severe left ventricular enlargement.  Mild mitral regurgitation.  Creatinine was 1.57 during the hospital.  He had issues with aphasia during that hospitalization but seemed to improve.  Bipolar disorder.  Chronic kidney disease stage IIIa as above.  At the end of the hospitalization he was on atorvastatin 20 mg carvedilol 6.25 mg twice a day digoxin 0.125 mg a day furosemide 40 mg a day losartan 25 mg a day  Past Medical History:  Diagnosis Date   Bipolar II disorder - Managed by Marye Round [phone]) 05/03/2013   Diabetes mellitus    Dysphagia    Hyperlipidemia    Hypertension    Unspecified hypothyroidism 05/03/2013    Past Surgical History:  Procedure Laterality Date   ICD IMPLANT N/A 05/20/2021   Procedure: ICD IMPLANT;  Surgeon: Evans Lance, MD;  Location: [facility];  Service: Cardiovascular;  Laterality: N/A;   KNEE SURGERY     scope on left knee   RIGHT/LEFT HEART CATH AND CORONARY ANGIOGRAPHY N/A 08/07/2020   Procedure: RIGHT/LEFT HEART CATH AND CORONARY ANGIOGRAPHY;  Surgeon: Leonie Man, MD;  Location: [facility];  Service: Cardiovascular;  Laterality: N/A;    Current Medications: No  outpatient medications have been marked as taking for the 10/31/21 encounter (Appointment) with Jerline Pain, MD.     Allergies:   Influenza vaccines   Social History   Socioeconomic History   Marital status: Single    Spouse name: Not on file   Number of children: Not on file   Years of education: Not on file   Highest education level: Not on file  Occupational History   Not on file  Tobacco Use   Smoking status: Never   Smokeless tobacco: Never  Vaping Use   Vaping Use: Never used  Substance and Sexual Activity   Alcohol use: No   Drug use: No   Sexual activity: Not Currently  Other Topics Concern   Not on file  Social History Narrative   Work or School: on disability for knee arthritis      Home Situation: lives alone      Spiritual Beliefs:Christian      Lifestyle:no regular exercising; diet not great            Social Determinants of Health   Financial Resource Strain: Low Risk  (02/25/2021)   Overall Financial Resource Strain (CARDIA)    Difficulty of Paying Living Expenses: Not hard at all  Food Insecurity: No Food Insecurity (02/25/2021)   Hunger Vital Sign    Worried About Running Out of Food in the Last Year: Never true    [place] in the Last Year: Never  true  Transportation Needs: No Transportation Needs (02/25/2021)   PRAPARE - Hydrologist (Medical): No    Lack of Transportation (Non-Medical): No  Physical Activity: Inactive (02/25/2021)   Exercise Vital Sign    Days of Exercise per Week: 0 days    Minutes of Exercise per Session: 0 min  Stress: No Stress Concern Present (02/25/2021)   [place]    Feeling of Stress : Not at all  Social Connections: Socially Isolated (02/15/2020)   Social Connection and Isolation Panel [NHANES]    Frequency of Communication with Friends and Family: Three times a week    Frequency of Social Gatherings with  Friends and Family: Never    Attends Religious Services: Never    Marine scientist or Organizations: No    Attends Music therapist: Never    Marital Status: Never married     Family History: The patient's family history includes Depression in his father, paternal grandmother, and another family member; Diabetes in his father, mother, and sister; Heart attack (age of onset: 23) in his father; Heart disease in his father and mother; Hypertension in his father and mother.  ROS:   Please see the history of present illness.    Positive for shortness of breath with activity, no chest pain no syncope no palpitations all other systems reviewed and are negative.  EKGs/Labs/Other Studies Reviewed:    The following studies were reviewed today: Echocardiogram 08/01/2020-EF 20 to 25%, no LV thrombus with Definity, mild MR  Cardiac catheterization 08/07/2020-no CAD, moderate pulmonary hypertension secondary 37 mmHg, LVEDP 30 mmHg  EKG:  EKG is  ordered today.  The ekg ordered today demonstrates sinus rhythm 87 no other significant changes.  Recent Labs: 02/28/2021: Pro B Natriuretic peptide (BNP) 93.0 06/18/2021: TSH 2.39 07/07/2021: B Natriuretic Peptide 222.6; Hemoglobin 12.6; Platelets 132 10/28/2021: ALT 17; BUN 12; Creatinine, Ser 1.34; Potassium 3.8; Sodium 142  Recent Lipid Panel    Component Value Date/Time   CHOL 91 10/28/2021 1144   TRIG 144.0 10/28/2021 1144   HDL 23.50 (L) 10/28/2021 1144   CHOLHDL 4 10/28/2021 1144   VLDL 28.8 10/28/2021 1144   LDLCALC 38 10/28/2021 1144   LDLDIRECT 73.0 09/01/2017 1054     Risk Assessment/Calculations:              Physical Exam:    VS:  There were no vitals taken for this visit.    Wt Readings from Last 3 Encounters:  09/11/21 (!) 300 lb 3.2 oz (136.2 kg)  08/23/21 (!) 302 lb (137 kg)  08/21/21 (!) 302 lb (137 kg)     GEN:  Well nourished, well developed in no acute distress HEENT: Normal NECK: No JVD; No  carotid bruits LYMPHATICS: No lymphadenopathy CARDIAC: RRR, no murmurs, no rubs, gallops RESPIRATORY:  Clear to auscultation without rales, wheezing or rhonchi  ABDOMEN: Protuberant soft, non-tender, non-distended MUSCULOSKELETAL:  No edema; No deformity  SKIN: Warm and dry NEUROLOGIC:  Alert and oriented x 3 PSYCHIATRIC:  Normal affect   ASSESSMENT:    No diagnosis found.  PLAN:    In order of problems listed above:  No problem-specific Assessment & Plan notes found for this encounter.         Medication Adjustments/Labs and Tests Ordered: Current medicines are reviewed at length with the patient today.  Concerns regarding medicines are outlined above.  No orders of the defined types were placed  in this encounter.  No orders of the defined types were placed in this encounter.   There are no Patient Instructions on file for this visit.   Matthew Barnes  10/31/2021 9:40 AM    [place]

## 2021-11-01 ENCOUNTER — Encounter: Payer: Self-pay | Admitting: Endocrinology

## 2021-11-01 ENCOUNTER — Ambulatory Visit: Payer: Medicare HMO | Admitting: Endocrinology

## 2021-11-01 VITALS — BP 154/92 | HR 85 | Ht 73.0 in | Wt 294.0 lb

## 2021-11-01 DIAGNOSIS — E1165 Type 2 diabetes mellitus with hyperglycemia: Secondary | ICD-10-CM

## 2021-11-01 DIAGNOSIS — Z794 Long term (current) use of insulin: Secondary | ICD-10-CM

## 2021-11-01 DIAGNOSIS — E782 Mixed hyperlipidemia: Secondary | ICD-10-CM

## 2021-11-01 NOTE — Patient Instructions (Addendum)
Go to 70 U in am  Take dinner shot daily  Check blood sugars on waking up 4 days a week  Also check blood sugars about 2 hours after meals and do this after different meals by rotation  Recommended blood sugar levels on waking up are 90-130 and about 2 hours after meal is 130-160  Please bring your blood sugar monitor to each visit, thank you

## 2021-11-01 NOTE — Progress Notes (Signed)
Patient ID: Matthew Barnes, male   DOB: October 27, 1955, 66 y.o.   MRN: [id number]    Reason for Appointment: Followup for Type 2 Diabetes    History of Present Illness:          Diagnosis: Type 2 diabetes mellitus, date of diagnosis:   2011       Past history:  He was started on metformin at diagnosis and apparently did have fairly good control initially However about a year later because of poor control he was given insulin in addition. He had been taking Lantus insulin until about 2/15, up to 80 units a day However he does not think his blood sugars  Were controlled with this Because of higher A1c of 13.1% he was referred here for diabetes management in 6/15; was started on glucose monitoring and insulin was changed from low dose Levemir to Humalog mix 70 units a day He was also started on Victoza  to help with blood sugar control and facilitate weight loss on his initial consultation  Recent history:   INSULIN regimen is described as: Novolin mix 70/30 with syringe, 60 units at breakfast, 40 units in p.m  Oral hypoglycemic drugs the patient is taking are: Ozempic 0 mg weekly  A1c 8.3  This has been as low as 5.8 and in 5/22 was 8.3   Current blood sugar patterns, management of diabetes and problems: 8/23 He did not refill his Ozempic because it was very expensive in the donut hole but did not let us know  He now says that he is sometimes forgetting his evening dinner insulin  Also blood sugar monitoring is erratic and most readings are being done midday, rarely truly fasting He was prescribed the freestyle libre system but he did not respond to the phone call made by the supplier and still has not had it  Most of his blood sugars are midday and are as high as 366 He thinks high sugars are related to eating carbohydrates or cake  Also fasting readings are mostly high and he thinks this is from eating during the night sometimes  Lab glucose was 148 midday but likely did not eat  breakfast that day with taking his insulin  He is not able to exercise much because of knee pain  Likely is not controlling his motion especially when he comes back in the evening after work He has a gym available in his apartment complex   Side effects from medications have been: Polyuria from Jardiance  Glucose monitoring:  done about 1 times a day or less       Glucometer: Accu-Chek  Blood Glucose readings as above   PRE-MEAL Fasting Lunch Dinner Bedtime Overall  Glucose range: 155-201 187-250 132-179 181   Mean/median:     183    Glycemic control:   Lab Results  Component Value Date   HGBA1C 8.3 (H) 10/28/2021   HGBA1C 6.9 (A) 08/21/2021   HGBA1C 7.5 (H) 04/09/2021   Lab Results  Component Value Date   MICROALBUR 1.9 02/28/2021   LDLCALC 38 10/28/2021   CREATININE 1.34 10/28/2021   Lab Results  Component Value Date   FRUCTOSAMINE 290 (H) 08/15/2021   FRUCTOSAMINE 301 (H) 06/18/2021   FRUCTOSAMINE 331 (H) 12/11/2020    Self-care: The diet that the patient has been following is: None, unable to control portions and carbs  when depressed  Meals: 3 meals per day (dinner 6 pm-10 pm)        Dietician visit:  Most recent: never      CDE visit: 08/2013             Weight history: baseline 295  Wt Readings from Last 3 Encounters:  11/01/21 294 lb (133.4 kg)  09/11/21 (!) 300 lb 3.2 oz (136.2 kg)  08/23/21 (!) 302 lb (137 kg)   Lab on 10/28/2021  Component Date Value Ref Range Status   Cholesterol 10/28/2021 91  0 - 200 mg/dL Final   ATP III Classification       Desirable:  < 200 mg/dL               Borderline High:  200 - 239 mg/dL          High:  > = 240 mg/dL   Triglycerides 10/28/2021 144.0  0.0 - 149.0 mg/dL Final   Normal:  <150 mg/dLBorderline High:  150 - 199 mg/dL   HDL 10/28/2021 23.50 (L)  >39.00 mg/dL Final   VLDL 10/28/2021 28.8  0.0 - 40.0 mg/dL Final   LDL Cholesterol 10/28/2021 38  0 - 99 mg/dL Final   Total CHOL/HDL Ratio 10/28/2021 4   Final                   Men          Women1/2 Average Risk     3.4          3.3Average Risk          5.0          4.42X Average Risk          9.6          7.13X Average Risk          15.0          11.0                       NonHDL 10/28/2021 67.11   Final   NOTE:  Non-HDL goal should be 30 mg/dL higher than patient's LDL goal (i.e. LDL goal of < 70 mg/dL, would have non-HDL goal of < 100 mg/dL)   Sodium 10/28/2021 142  135 - 145 mEq/L Final   Potassium 10/28/2021 3.8  3.5 - 5.1 mEq/L Final   Chloride 10/28/2021 105  96 - 112 mEq/L Final   CO2 10/28/2021 29  19 - 32 mEq/L Final   Glucose, Bld 10/28/2021 148 (H)  70 - 99 mg/dL Final   BUN 10/28/2021 12  6 - 23 mg/dL Final   Creatinine, Ser 10/28/2021 1.34  0.40 - 1.50 mg/dL Final   Total Bilirubin 10/28/2021 0.6  0.2 - 1.2 mg/dL Final   Alkaline Phosphatase 10/28/2021 53  39 - 117 U/L Final   AST 10/28/2021 21  0 - 37 U/L Final   ALT 10/28/2021 17  0 - 53 U/L Final   Total Protein 10/28/2021 6.9  6.0 - 8.3 g/dL Final   Albumin 10/28/2021 4.2  3.5 - 5.2 g/dL Final   GFR 10/28/2021 55.54 (L)  >60.00 mL/min Final   Calculated using the CKD-EPI Creatinine Equation (2021)   Calcium 10/28/2021 9.2  8.4 - 10.5 mg/dL Final   Hgb A1c MFr Bld 10/28/2021 8.3 (H)  4.6 - 6.5 % Final   Glycemic Control Guidelines for People with Diabetes:Non Diabetic:  <6%Goal of Therapy: <7%Additional Action Suggested:  >8%       Allergies as of 11/01/2021       Reactions   Influenza Vaccines  Swelling, Other (See Comments)   Reports of swelling of the arm and then was "sick" for 10 days        Medication List        Accurate as of November 01, 2021 11:59 PM. If you have any questions, ask your nurse or doctor.          Accu-Chek Guide w/Device Kit Check blood sugar 2 times daily   Accu-Chek Softclix Lancets lancets Check blood sugar 2 times daily   atorvastatin 20 MG tablet Commonly known as: LIPITOR Take 1 tablet (20 mg total) by mouth daily.   Blood  Pressure Cuff Misc Use as directed   carvedilol 6.25 MG tablet Commonly known as: COREG TAKE 1 TABLET BY MOUTH TWICE A DAY WITH MEALS   cholecalciferol 25 MCG (1000 UNIT) tablet Commonly known as: VITAMIN D3 Take 1,000 Units by mouth daily.   digoxin 0.125 MG tablet Commonly known as: LANOXIN TAKE 1 TABLET BY MOUTH EVERY DAY   furosemide 20 MG tablet Commonly known as: LASIX Take 1 tablet (20 mg total) by mouth daily.   INSULIN SYRINGE 1CC/31GX5/16" 31G X 5/16" 1 ML Misc Use 3 needles per day   lithium carbonate 300 MG capsule Take 3 capsules (900 mg total) by mouth daily.   losartan 50 MG tablet Commonly known as: COZAAR TAKE 1 TABLET BY MOUTH EVERY DAY   NovoLIN 70/30 (70-30) 100 UNIT/ML injection Generic drug: insulin NPH-regular Human INJECT 40 UNITS INTO THE SKIN 2 (TWO) TIMES DAILY WITH A MEAL.   omeprazole 20 MG capsule Commonly known as: PRILOSEC Take 1 capsule (20 mg total) by mouth daily.   OneTouch Verio test strip Generic drug: glucose blood USE AS INSTRUCTED TO CHECK BLOOD SUGAR 2 TIMES A DAY.   Accu-Chek Guide test strip Generic drug: glucose blood Use to check blood sugar 2 times daily   Ozempic (1 MG/DOSE) 4 MG/3ML Sopn Generic drug: Semaglutide (1 MG/DOSE) Inject 1 mg weekly   QUEtiapine 200 MG tablet Commonly known as: SEROQUEL Take 450 mg by mouth at bedtime. Per patient taking 500 mg   sildenafil 50 MG tablet Commonly known as: Viagra Take 1 tablet (50 mg total) by mouth daily as needed for erectile dysfunction. Do not take more then once in 24 hours.        Allergies:  Allergies  Allergen Reactions   Influenza Vaccines Swelling and Other (See Comments)    Reports of swelling of the arm and then was "sick" for 10 days    Past Medical History:  Diagnosis Date   Bipolar II disorder - Managed by Marye Round [phone]) 05/03/2013   Diabetes mellitus    Dysphagia    Hyperlipidemia    Hypertension    Unspecified  hypothyroidism 05/03/2013    Past Surgical History:  Procedure Laterality Date   ICD IMPLANT N/A 05/20/2021   Procedure: ICD IMPLANT;  Surgeon: Evans Lance, MD;  Location: [facility];  Service: Cardiovascular;  Laterality: N/A;   KNEE SURGERY     scope on left knee   RIGHT/LEFT HEART CATH AND CORONARY ANGIOGRAPHY N/A 08/07/2020   Procedure: RIGHT/LEFT HEART CATH AND CORONARY ANGIOGRAPHY;  Surgeon: Leonie Man, MD;  Location: [facility];  Service: Cardiovascular;  Laterality: N/A;    Family History  Problem Relation Age of Onset   Diabetes Mother    Hypertension Mother    Heart disease Mother    Heart disease Father    Heart attack  Father 66   Diabetes Father    Hypertension Father    Depression Father    Diabetes Sister    Depression Paternal Grandmother    Depression Other     Social History:  reports that he has never smoked. He has never used smokeless tobacco. He reports that he does not drink alcohol and does not use drugs.    Review of Systems   HYPERTENSION:  He is taking losartan 25 mg and carvedilol 6.25 mg from his cardiologist Blood pressure is relatively higher in the office  Blood pressure readings as follows:  BP Readings from Last 3 Encounters:  11/01/21 (!) 154/92  09/11/21 (!) 138/94  08/23/21 130/80   Renal dysfunction: He has mild fluctuation in his creatinine level  No previous history of microalbuminuria  Lab Results  Component Value Date   CREATININE 1.34 10/28/2021   CREATININE 1.32 (H) 08/30/2021   CREATININE 1.40 08/15/2021   HEART failure: He has had CHF and was in the emergency room for treatment in July Continues to be on Lasix and Lanoxin along with Coreg and Jardiance       LIPIDS: He has had marked increase in triglycerides previously, now back to normal.  Taking 20 mg Lipitor  Did not have any coronary artery disease  He is also followed by PCP       Lab Results  Component Value Date   CHOL 91  10/28/2021   HDL 23.50 (L) 10/28/2021   LDLCALC 38 10/28/2021   LDLDIRECT 73.0 09/01/2017   TRIG 144.0 10/28/2021   CHOLHDL 4 10/28/2021       Thyroid:  Previously had mild hypothyroidism for a few years TSH subsequently has been normal consistently without taking any thyroid supplements  Has been on lithium long-term  Lab Results  Component Value Date   TSH 2.39 06/18/2021        He has had some numbness in his feet or hands, more on the right side, using gabapentin more frequently now with increased symptoms  Findings on last exam:   Patchy decrease in sensation in the toes especially right and decreased on the plantar surface also   LABS:  Lab on 10/28/2021  Component Date Value Ref Range Status   Cholesterol 10/28/2021 91  0 - 200 mg/dL Final   ATP III Classification       Desirable:  < 200 mg/dL               Borderline High:  200 - 239 mg/dL          High:  > = 240 mg/dL   Triglycerides 10/28/2021 144.0  0.0 - 149.0 mg/dL Final   Normal:  <150 mg/dLBorderline High:  150 - 199 mg/dL   HDL 10/28/2021 23.50 (L)  >39.00 mg/dL Final   VLDL 10/28/2021 28.8  0.0 - 40.0 mg/dL Final   LDL Cholesterol 10/28/2021 38  0 - 99 mg/dL Final   Total CHOL/HDL Ratio 10/28/2021 4   Final                  Men          Women1/2 Average Risk     3.4          3.3Average Risk          5.0          4.42X Average Risk          9.6  7.13X Average Risk          15.0          11.0                       NonHDL 10/28/2021 67.11   Final   NOTE:  Non-HDL goal should be 30 mg/dL higher than patient's LDL goal (i.e. LDL goal of < 70 mg/dL, would have non-HDL goal of < 100 mg/dL)   Sodium 10/28/2021 142  135 - 145 mEq/L Final   Potassium 10/28/2021 3.8  3.5 - 5.1 mEq/L Final   Chloride 10/28/2021 105  96 - 112 mEq/L Final   CO2 10/28/2021 29  19 - 32 mEq/L Final   Glucose, Bld 10/28/2021 148 (H)  70 - 99 mg/dL Final   BUN 10/28/2021 12  6 - 23 mg/dL Final   Creatinine, Ser 10/28/2021 1.34  0.40  - 1.50 mg/dL Final   Total Bilirubin 10/28/2021 0.6  0.2 - 1.2 mg/dL Final   Alkaline Phosphatase 10/28/2021 53  39 - 117 U/L Final   AST 10/28/2021 21  0 - 37 U/L Final   ALT 10/28/2021 17  0 - 53 U/L Final   Total Protein 10/28/2021 6.9  6.0 - 8.3 g/dL Final   Albumin 10/28/2021 4.2  3.5 - 5.2 g/dL Final   GFR 10/28/2021 55.54 (L)  >60.00 mL/min Final   Calculated using the CKD-EPI Creatinine Equation (2021)   Calcium 10/28/2021 9.2  8.4 - 10.5 mg/dL Final   Hgb A1c MFr Bld 10/28/2021 8.3 (H)  4.6 - 6.5 % Final   Glycemic Control Guidelines for People with Diabetes:Non Diabetic:  <6%Goal of Therapy: <7%Additional Action Suggested:  >8%     Physical Examination:  BP (!) 154/92   Pulse 85   Ht _0  (1.854 m)   Wt 294 lb (133.4 kg)   SpO2 92%   BMI 38.79 kg/m      ASSESSMENT/PLAN:   Diabetes type 2, on insulin  See history of present illness for detailed discussion of his current management, blood sugar patterns and problems identified  A1c is 8.3 compared to 7.5  He is on a regimen of premixed insulin currently  His blood sugars are worse with not taking Ozempic Also not compliant with evening insulin frequently Diet has been suboptimal again with excessive carbohydrate intake or sweets Surprisingly has lost weight from last visit even without much exercise  As above he did not start the freestyle libre sensor as recommended  He will call one of the DME suppliers from the list given to get started on the freestyle libre sensor and he will call to let us know if he needs any help setting this up Start taking dinnertime insulin consistently He needs to increase his morning insulin to 70 units for now Needs to cut back on carbohydrates and sweets consistently Encouraged him to be as active as possible Encouraged him to exercise regularly Start checking blood sugars consistently after meals to help adjust his insulin especially after dinner With the freestyle libre he  will need to check blood sugars 4 times a day and discussed blood sugar targets both after meals and fasting Hopefully he will do better with regard once he is able to see his blood sugar patterns He can use his phone app but if he needs any help starting we can have him see the nurse educator  RENAL dysfunction: No change  LIPIDS: Well-controlled and will continue Lipitor  Patient Instructions  Go to 70 U in am  Take dinner shot daily  Check blood sugars on waking up 4 days a week  Also check blood sugars about 2 hours after meals and do this after different meals by rotation  Recommended blood sugar levels on waking up are 90-130 and about 2 hours after meal is 130-160  Please bring your blood sugar monitor to each visit, thank you  Total visit time including counseling = 30 minutes   Elayne Snare 11/03/2021, 3:47 PM   Note: This office note was prepared with Dragon voice recognition system technology. Any transcriptional errors that result from this process are unintentional.

## 2021-11-06 ENCOUNTER — Other Ambulatory Visit: Payer: Self-pay | Admitting: *Deleted

## 2021-11-06 MED ORDER — CARVEDILOL 6.25 MG PO TABS: 6.2500 mg | ORAL_TABLET | Freq: Two times a day (BID) | ORAL | 0 refills | Status: DC

## 2021-11-06 NOTE — Telephone Encounter (Signed)
I will refill but he needs an appt

## 2021-11-08 ENCOUNTER — Other Ambulatory Visit: Payer: Self-pay | Admitting: Nurse Practitioner

## 2021-11-18 ENCOUNTER — Ambulatory Visit (INDEPENDENT_AMBULATORY_CARE_PROVIDER_SITE_OTHER): Payer: Medicare HMO | Admitting: Family Medicine

## 2021-11-18 ENCOUNTER — Telehealth: Payer: Self-pay | Admitting: Family Medicine

## 2021-11-18 ENCOUNTER — Encounter: Payer: Self-pay | Admitting: Family Medicine

## 2021-11-18 VITALS — BP 158/84 | HR 104 | Temp 98.7°F | Wt 301.6 lb

## 2021-11-18 DIAGNOSIS — R42 Dizziness and giddiness: Secondary | ICD-10-CM

## 2021-11-18 DIAGNOSIS — E1140 Type 2 diabetes mellitus with diabetic neuropathy, unspecified: Secondary | ICD-10-CM | POA: Diagnosis not present

## 2021-11-18 DIAGNOSIS — J302 Other seasonal allergic rhinitis: Secondary | ICD-10-CM

## 2021-11-18 DIAGNOSIS — I10 Essential (primary) hypertension: Secondary | ICD-10-CM | POA: Diagnosis not present

## 2021-11-18 DIAGNOSIS — Z794 Long term (current) use of insulin: Secondary | ICD-10-CM

## 2021-11-18 LAB — GLUCOSE, POCT (MANUAL RESULT ENTRY): POC Glucose: 303 mg/dl — AB (ref 70–99)

## 2021-11-18 MED ORDER — CETIRIZINE HCL 10 MG PO TABS: 10.0000 mg | ORAL_TABLET | Freq: Every day | ORAL | 0 refills | Status: DC

## 2021-11-18 NOTE — Telephone Encounter (Signed)
Pt would like to TOC to dr banks

## 2021-11-18 NOTE — Progress Notes (Signed)
Subjective:    Patient ID: Matthew Barnes, male    DOB: 06/04/1955, 67 y.o.   MRN: [id number]  Chief Complaint  Patient presents with   Dizziness    Has had vertigo before. Dizziness happens when he stands up too quickly, or looks down, when wakes up has to sit a moment for the room to stop spinning.      HPI Patient was seen today for acute concern.  Patient endorses dizziness x3 weeks or more.  Endorses symptoms with standing or leaning over.  Also notes symptoms with getting out of bed.  Patient states he is drinking mostly water during the day, has already had 32 oz this am.  Blood sugar this morning was 214 as he ate peanut butter and graham crackers last night around 2 or 3 am.  Pt had 2 sausage, biscuits with jelly this am for breakfast.  Pt taking novolin 70/30 70 units in am and 40 units in pm.  Pt states he has not taking his bp meds yet as he takes them at night.  Per med list pt on Coreg 6.25 mg twice daily, losartan 50 mg daily.    Pt endorses increased sneezing.  Denies allergies, but states in the past he took zyrtec.  Pt has yet to [facility], previously seen by Dr. Ethlyn Gallery.  Past Medical History:  Diagnosis Date   Bipolar II disorder - Managed by Marye Round [phone]) 05/03/2013   Diabetes mellitus    Dysphagia    Hyperlipidemia    Hypertension    Unspecified hypothyroidism 05/03/2013    Allergies  Allergen Reactions   Influenza Vaccines Swelling and Other (See Comments)    Reports of swelling of the arm and then was "sick" for 10 days    ROS General: Denies fever, chills, night sweats, changes in weight, changes in appetite + dizziness HEENT: Denies headaches, ear pain, changes in vision, rhinorrhea, sore throat CV: Denies CP, palpitations, SOB, orthopnea Pulm: Denies SOB, cough, wheezing GI: Denies abdominal pain, nausea, vomiting, diarrhea, constipation GU: Denies dysuria, hematuria, frequency Msk: Denies muscle cramps, joint pains Neuro: Denies  weakness, numbness, tingling Skin: Denies rashes, bruising Psych: Denies depression, anxiety, hallucinations     Objective:    Blood pressure (!) 158/84, pulse (!) 104, temperature 98.7 F (37.1 C), temperature source Oral, weight (!) 301 lb 9.6 oz (136.8 kg), SpO2 98 %.  Gen. Pleasant, well-nourished, in no distress, normal affect   HEENT: [place]/AT, face symmetric, conjunctiva clear, no scleral icterus, PERRLA, EOMI, no nystagmus, nares patent without drainage, pharynx without erythema or exudate.  TMs full bilaterally. Lungs: no accessory muscle use, CTAB, no wheezes or rales Cardiovascular: RRR, no m/r/g, no peripheral edema Musculoskeletal: No deformities, no cyanosis or clubbing, normal tone Neuro:  A&Ox3, CN II-XII intact, normal gait Skin:  Warm, no lesions/ rash   Wt Readings from Last 3 Encounters:  11/18/21 (!) 301 lb 9.6 oz (136.8 kg)  11/01/21 294 lb (133.4 kg)  09/11/21 (!) 300 lb 3.2 oz (136.2 kg)    Lab Results  Component Value Date   WBC 6.2 07/07/2021   HGB 12.6 (L) 07/07/2021   HCT 39.3 07/07/2021   PLT 132 (L) 07/07/2021   GLUCOSE 148 (H) 10/28/2021   CHOL 91 10/28/2021   TRIG 144.0 10/28/2021   HDL 23.50 (L) 10/28/2021   LDLDIRECT 73.0 09/01/2017   LDLCALC 38 10/28/2021   ALT 17 10/28/2021   AST 21 10/28/2021   NA 142 10/28/2021   K 3.8  10/28/2021   CL 105 10/28/2021   CREATININE 1.34 10/28/2021   BUN 12 10/28/2021   CO2 29 10/28/2021   TSH 2.39 06/18/2021   PSA 1.90 09/04/2014   INR 1.1 07/07/2021   HGBA1C 8.3 (H) 10/28/2021   MICROALBUR 1.9 02/28/2021    Assessment/Plan:  Dizziness -Discussed various causes including hyperglycemia, dehydration, medications, allergies etc. -orthostatic vs -will start zyrtec to see if related to allergy symptoms -continue po hydration -discussed the importance of bp and bs control. -recent labs from 10/28/21 reviewed  Essential hypertension -Uncontrolled -Patient advised to take medication as prescribed.   Currently taking all bp meds at night.  Pt advised  Coreg 6.25 mg is twice a day  Type 2 diabetes mellitus with diabetic neuropathy, with long-term current use of insulin (HCC) -bs 303 in office -continue current meds including Novolin 70/30 70 units in a.m. and 40 units in p.m. -Advised on the importance of dietary changes to help decrease blood sugar -Continue ARB and statin  - Plan: POC Glucose (CBG)  Seasonal allergies  - Plan: cetirizine (ZYRTEC) 10 MG tablet  F/u prn.  Pt to schedule TOC visit.  Grier Mitts, MD

## 2021-11-19 DIAGNOSIS — M9902 Segmental and somatic dysfunction of thoracic region: Secondary | ICD-10-CM | POA: Diagnosis not present

## 2021-11-19 DIAGNOSIS — M546 Pain in thoracic spine: Secondary | ICD-10-CM | POA: Diagnosis not present

## 2021-11-19 DIAGNOSIS — M62830 Muscle spasm of back: Secondary | ICD-10-CM | POA: Diagnosis not present

## 2021-11-19 DIAGNOSIS — M9901 Segmental and somatic dysfunction of cervical region: Secondary | ICD-10-CM | POA: Diagnosis not present

## 2021-11-21 DIAGNOSIS — F3132 Bipolar disorder, current episode depressed, moderate: Secondary | ICD-10-CM | POA: Diagnosis not present

## 2021-11-22 DIAGNOSIS — M9902 Segmental and somatic dysfunction of thoracic region: Secondary | ICD-10-CM | POA: Diagnosis not present

## 2021-11-22 DIAGNOSIS — M62830 Muscle spasm of back: Secondary | ICD-10-CM | POA: Diagnosis not present

## 2021-11-22 DIAGNOSIS — M9901 Segmental and somatic dysfunction of cervical region: Secondary | ICD-10-CM | POA: Diagnosis not present

## 2021-11-22 DIAGNOSIS — M546 Pain in thoracic spine: Secondary | ICD-10-CM | POA: Diagnosis not present

## 2021-11-26 ENCOUNTER — Ambulatory Visit (INDEPENDENT_AMBULATORY_CARE_PROVIDER_SITE_OTHER): Payer: Medicare HMO

## 2021-11-26 DIAGNOSIS — I420 Dilated cardiomyopathy: Secondary | ICD-10-CM

## 2021-11-27 LAB — CUP PACEART REMOTE DEVICE CHECK
Battery Remaining Longevity: 133 mo
Battery Voltage: 3.06 V
Brady Statistic RV Percent Paced: 0 %
Date Time Interrogation Session: 20230829093326
HighPow Impedance: 70 Ohm
Implantable Lead Implant Date: 20230220
Implantable Lead Location: 753860
Implantable Lead Model: 6935
Implantable Pulse Generator Implant Date: 20230220
Lead Channel Impedance Value: 399 Ohm
Lead Channel Impedance Value: 494 Ohm
Lead Channel Pacing Threshold Amplitude: 0.5 V
Lead Channel Pacing Threshold Pulse Width: 0.4 ms
Lead Channel Sensing Intrinsic Amplitude: 4.75 mV
Lead Channel Sensing Intrinsic Amplitude: 4.75 mV
Lead Channel Setting Pacing Amplitude: 2 V
Lead Channel Setting Pacing Pulse Width: 0.4 ms
Lead Channel Setting Sensing Sensitivity: 0.3 mV

## 2021-11-30 ENCOUNTER — Other Ambulatory Visit: Payer: Self-pay | Admitting: Family Medicine

## 2021-12-10 ENCOUNTER — Other Ambulatory Visit: Payer: Self-pay | Admitting: *Deleted

## 2021-12-11 DIAGNOSIS — M546 Pain in thoracic spine: Secondary | ICD-10-CM | POA: Diagnosis not present

## 2021-12-11 DIAGNOSIS — M9902 Segmental and somatic dysfunction of thoracic region: Secondary | ICD-10-CM | POA: Diagnosis not present

## 2021-12-11 DIAGNOSIS — M9901 Segmental and somatic dysfunction of cervical region: Secondary | ICD-10-CM | POA: Diagnosis not present

## 2021-12-11 DIAGNOSIS — M62830 Muscle spasm of back: Secondary | ICD-10-CM | POA: Diagnosis not present

## 2021-12-11 MED ORDER — FUROSEMIDE 20 MG PO TABS: 20.0000 mg | ORAL_TABLET | Freq: Every day | ORAL | 0 refills | Status: DC

## 2021-12-13 ENCOUNTER — Other Ambulatory Visit: Payer: Self-pay | Admitting: Endocrinology

## 2021-12-13 DIAGNOSIS — E1165 Type 2 diabetes mellitus with hyperglycemia: Secondary | ICD-10-CM

## 2021-12-16 DIAGNOSIS — M9901 Segmental and somatic dysfunction of cervical region: Secondary | ICD-10-CM | POA: Diagnosis not present

## 2021-12-16 DIAGNOSIS — M62830 Muscle spasm of back: Secondary | ICD-10-CM | POA: Diagnosis not present

## 2021-12-16 DIAGNOSIS — M9902 Segmental and somatic dysfunction of thoracic region: Secondary | ICD-10-CM | POA: Diagnosis not present

## 2021-12-16 DIAGNOSIS — M546 Pain in thoracic spine: Secondary | ICD-10-CM | POA: Diagnosis not present

## 2021-12-19 ENCOUNTER — Telehealth: Payer: Self-pay | Admitting: Pharmacist

## 2021-12-19 NOTE — Chronic Care Management (AMB) (Signed)
    Chronic Care Management Pharmacy Assistant   Name: Matthew Barnes  MRN: [id number] DOB: 1955-06-23  Reason for Encounter: Reschedule appointment with Jeni Salles Clinical Pharmacist.   Rescheduled appointment with patient to 02/24/2022.   [place] Pharmacist Assistant [phone]

## 2021-12-20 NOTE — Progress Notes (Signed)
Remote ICD transmission.   

## 2021-12-23 ENCOUNTER — Encounter: Payer: Self-pay | Admitting: Family Medicine

## 2021-12-23 ENCOUNTER — Ambulatory Visit (INDEPENDENT_AMBULATORY_CARE_PROVIDER_SITE_OTHER): Payer: Medicare HMO | Admitting: Family Medicine

## 2021-12-23 VITALS — BP 132/60 | HR 91 | Temp 98.8°F | Wt 303.0 lb

## 2021-12-23 DIAGNOSIS — K219 Gastro-esophageal reflux disease without esophagitis: Secondary | ICD-10-CM

## 2021-12-23 DIAGNOSIS — Z794 Long term (current) use of insulin: Secondary | ICD-10-CM

## 2021-12-23 DIAGNOSIS — E1140 Type 2 diabetes mellitus with diabetic neuropathy, unspecified: Secondary | ICD-10-CM | POA: Diagnosis not present

## 2021-12-23 DIAGNOSIS — I10 Essential (primary) hypertension: Secondary | ICD-10-CM

## 2021-12-23 DIAGNOSIS — F3160 Bipolar disorder, current episode mixed, unspecified: Secondary | ICD-10-CM | POA: Diagnosis not present

## 2021-12-23 DIAGNOSIS — R140 Abdominal distension (gaseous): Secondary | ICD-10-CM | POA: Diagnosis not present

## 2021-12-23 MED ORDER — OMEPRAZOLE 20 MG PO CPDR: 20.0000 mg | DELAYED_RELEASE_CAPSULE | Freq: Every day | ORAL | 0 refills | Status: DC

## 2021-12-23 NOTE — Patient Instructions (Addendum)
A referral was placed for you to see the Gastroenterologist (stomach doctor).  You will receive a phone cal  about scheduling this appt.  It is important to keep the appointment once it is made.  A refill for acid reflux medicine was also sent to your pharmacy.

## 2021-12-23 NOTE — Progress Notes (Signed)
Subjective:    Patient ID: Matthew Barnes, male    DOB: 03/24/1956, 66 y.o.   MRN: [id number]  Chief Complaint  Patient presents with   Follow-up    Dizziness is pretty good, not hardly happening at all.     HPI Patient is a 66 yo male previously seen by Dr. Ethlyn Gallery, seen today for f/u on dizziness and chronic conditions.  Pt seen 11/18/21 for dizziness.  At the time was taking all meds in evening.  Pt has not had any dizziness since taking bp med BID.  Trying to drink more water.  Blood sugar this morning 151.  Patient notes when feeling depressed drinks more water and eats more carbs.  States mood has been so-so.  Typically notes increased symptoms around fall and winter.  Taking Zoloft as needed not daily.  Patient requesting refill on PPI.  Previously on Protonix however felt it was not helping so rx changed to Prilosec.  Now requesting refill on Protonix.  Endorses abdominal bloating.  Having daily BMs.  Denies nausea/vomiting.  Has not seen GI.  Past Medical History:  Diagnosis Date   Bipolar II disorder - Managed by Marye Round [phone]) 05/03/2013   Diabetes mellitus    Dysphagia    Hyperlipidemia    Hypertension    Unspecified hypothyroidism 05/03/2013    Allergies  Allergen Reactions   Influenza Vaccines Swelling and Other (See Comments)    Reports of swelling of the arm and then was "sick" for 10 days    ROS General: Denies fever, chills, night sweats, changes in weight, changes in appetite HEENT: Denies headaches, ear pain, changes in vision, rhinorrhea, sore throat CV: Denies CP, palpitations, SOB, orthopnea Pulm: Denies SOB, cough, wheezing GI: Denies abdominal pain, nausea, vomiting, diarrhea, constipation + abdominal bloating, acid reflux GU: Denies dysuria, hematuria, frequency Msk: Denies muscle cramps, joint pains Neuro: Denies weakness, numbness, tingling Skin: Denies rashes, bruising Psych: Denies depression, anxiety, hallucinations + depressed  mood     Objective:    Blood pressure 132/60, pulse 91, temperature 98.8 F (37.1 C), temperature source Oral, weight (!) 303 lb (137.4 kg), SpO2 94 %.  Gen. Pleasant, well-nourished, in no distress, normal affect   HEENT: [employer]/AT, face symmetric, conjunctiva clear, no scleral icterus, PERRLA, EOMI, nares patent without drainage Lungs: no accessory muscle use, CTAB, no wheezes or rales Cardiovascular: RRR, no m/r/g, no peripheral edema Abdomen: Distention, BS present, soft, NT Musculoskeletal: No deformities, no cyanosis or clubbing, normal tone Neuro:  A&Ox3, CN II-XII intact, normal gait Skin:  Warm, no lesions/ rash   Wt Readings from Last 3 Encounters:  11/18/21 (!) 301 lb 9.6 oz (136.8 kg)  11/01/21 294 lb (133.4 kg)  09/11/21 (!) 300 lb 3.2 oz (136.2 kg)    Lab Results  Component Value Date   WBC 6.2 07/07/2021   HGB 12.6 (L) 07/07/2021   HCT 39.3 07/07/2021   PLT 132 (L) 07/07/2021   GLUCOSE 148 (H) 10/28/2021   CHOL 91 10/28/2021   TRIG 144.0 10/28/2021   HDL 23.50 (L) 10/28/2021   LDLDIRECT 73.0 09/01/2017   LDLCALC 38 10/28/2021   ALT 17 10/28/2021   AST 21 10/28/2021   NA 142 10/28/2021   K 3.8 10/28/2021   CL 105 10/28/2021   CREATININE 1.34 10/28/2021   BUN 12 10/28/2021   CO2 29 10/28/2021   TSH 2.39 06/18/2021   PSA 1.90 09/04/2014   INR 1.1 07/07/2021   HGBA1C 8.3 (H) 10/28/2021   MICROALBUR  1.9 02/28/2021    Assessment/Plan:  Essential hypertension -controlled -Continue taking current medications as prescribed including Coreg 6.25 mg twice daily, losartan 50 mg daily, Lasix 20 mg daily -Continue lifestyle modifications  Type 2 diabetes mellitus with diabetic neuropathy, with long-term current use of insulin (HCC) -Hemoglobin A1c 8.3% on 10/28/2021 -The importance of lifestyle modifications -Continue Novolin 70/30 40 units twice daily -We will remove Ozempic from med list as patient states he is not taking this.  Abdominal bloating  -  Plan: Ambulatory referral to Gastroenterology  Gastroesophageal reflux disease, unspecified whether esophagitis present -Avoid foods known to cause symptoms -PPI refilled  - Plan: omeprazole (PRILOSEC) 20 MG capsule, Ambulatory referral to Gastroenterology  Bipolar affective, mixed ([place]) -Stable -Patient daily as opposed to as needed. -Continue current medications including Zoloft 100 mg, lithium 900 mg daily, Seroque nightly  F/u prn  Grier Mitts, MD

## 2022-01-02 ENCOUNTER — Other Ambulatory Visit: Payer: Self-pay | Admitting: Family Medicine

## 2022-01-02 ENCOUNTER — Other Ambulatory Visit: Payer: Self-pay | Admitting: *Deleted

## 2022-01-02 MED ORDER — FUROSEMIDE 20 MG PO TABS: 20.0000 mg | ORAL_TABLET | Freq: Every day | ORAL | 1 refills | Status: DC

## 2022-01-16 ENCOUNTER — Other Ambulatory Visit: Payer: Self-pay | Admitting: Family Medicine

## 2022-01-16 DIAGNOSIS — K219 Gastro-esophageal reflux disease without esophagitis: Secondary | ICD-10-CM

## 2022-01-17 NOTE — Telephone Encounter (Signed)
Pt has TOC with dr [name]

## 2022-01-22 ENCOUNTER — Encounter: Payer: Self-pay | Admitting: Family Medicine

## 2022-01-22 ENCOUNTER — Ambulatory Visit (INDEPENDENT_AMBULATORY_CARE_PROVIDER_SITE_OTHER): Payer: Medicare HMO | Admitting: Family Medicine

## 2022-01-22 VITALS — BP 100/68 | HR 73 | Temp 98.1°F | Ht 73.0 in | Wt 301.3 lb

## 2022-01-22 DIAGNOSIS — F3132 Bipolar disorder, current episode depressed, moderate: Secondary | ICD-10-CM | POA: Diagnosis not present

## 2022-01-22 DIAGNOSIS — Z794 Long term (current) use of insulin: Secondary | ICD-10-CM | POA: Diagnosis not present

## 2022-01-22 DIAGNOSIS — N528 Other male erectile dysfunction: Secondary | ICD-10-CM | POA: Diagnosis not present

## 2022-01-22 DIAGNOSIS — I152 Hypertension secondary to endocrine disorders: Secondary | ICD-10-CM

## 2022-01-22 DIAGNOSIS — E1159 Type 2 diabetes mellitus with other circulatory complications: Secondary | ICD-10-CM | POA: Diagnosis not present

## 2022-01-22 DIAGNOSIS — Z23 Encounter for immunization: Secondary | ICD-10-CM | POA: Diagnosis not present

## 2022-01-22 DIAGNOSIS — E1140 Type 2 diabetes mellitus with diabetic neuropathy, unspecified: Secondary | ICD-10-CM

## 2022-01-22 MED ORDER — TADALAFIL 20 MG PO TABS: 10.0000 mg | ORAL_TABLET | ORAL | 11 refills | Status: DC | PRN

## 2022-01-22 NOTE — Progress Notes (Unsigned)
Established Patient Office Visit  Subjective   Patient ID: Matthew Barnes, male    DOB: 1956/01/30  Age: 66 y.o. MRN: [id number]  Chief Complaint  Patient presents with   Establish Care    Pt is here for follow up/ transition of care visit.   HTN-- patient experienced dizziness at night due to taking the carvedilol 2 tablets at nighttime, states he is now taking it once in the morning and once at night and reports that the dizziness is improved. He is still occasionally getting dizzy when he bends over and then stands back up. Also is taking the losartan 50 mg at night, states this is working well for him. No other side effects or associated symptoms. He reports compliance with his medications. 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   GERD-- pt was started on omeprazole 20 mg daily at the last visit. States that this medication is working well for him at this dose. Would like to continue this medication.   Diabetes-- currently managed by Dr. Elayne Snare. States that he is on insulin only, states that the other medications are too expensive for him to afford. He already has a CCM referral. He reports he had to stop the semaglutide due to the cost of the medication. Has a follow up with Dr. Dwyane Dee next month.  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 He would like to try the cialis to see if this will be effective.      Current Outpatient Medications  Medication Instructions   ACCU-CHEK GUIDE test strip USE TO CHECK BLOOD SUGAR 2 TIMES DAILY   Accu-Chek Softclix Lancets lancets Check blood sugar 2 times daily   atorvastatin (LIPITOR) 20 mg, Oral, Daily   Blood Glucose Monitoring Suppl (ACCU-CHEK GUIDE) w/Device KIT Check blood sugar 2 times daily   carvedilol (COREG) 6.25 mg, Oral, 2 times daily with meals   digoxin (LANOXIN) 0.125 MG tablet  TAKE 1 TABLET BY MOUTH EVERY DAY   furosemide (LASIX) 20 mg, Oral, Daily   glucose blood (ONETOUCH VERIO) test strip USE AS INSTRUCTED TO CHECK BLOOD SUGAR 2 TIMES A DAY.   Insulin Syringe-Needle U-100 (INSULIN SYRINGE 1CC/31GX5/16") 31G X 5/16" 1 ML MISC Use 3 needles per day   lithium carbonate 900 mg, Oral, Daily   losartan (COZAAR) 50 mg, Oral, Daily   NOVOLIN 70/30 (70-30) 100 UNIT/ML injection 40 Units, Subcutaneous, 2 times daily with meals   omeprazole (PRILOSEC) 20 mg, Oral, Daily   QUEtiapine (SEROQUEL) 450 mg, Oral, Daily at bedtime, Per patient taking 500 mg   sildenafil (VIAGRA) 50 mg, Oral, Daily PRN, Do not take more then once in 24 hours.   tadalafil (CIALIS) 10-20 mg, Oral, Every 48 hours PRN    Patient Active Problem List   Diagnosis Date Noted   ICD (implantable cardioverter-defibrillator) in place 09/11/2021   Morbid obesity ([place]) 04/12/2021   Other secondary pulmonary hypertension ([place]) 04/12/2021   Chronic kidney disease, stage 3b ([place]) 04/12/2021   Dilated cardiomyopathy ([place])    Elevated troponin level not due to acute coronary syndrome    Chronic combined systolic and diastolic heart failure ([place]) 08/06/2020   AMS (altered mental status) 07/31/2020   Aphasia 07/31/2020   Difficulty with speech 07/30/2020   Chest pain 03/26/2018   Dyspnea on exertion 03/26/2018  Family history of heart disease 03/26/2018   Leg edema 10/27/2016   Bipolar affective, mixed ([place]) 04/29/2016   Esophageal reflux 01/17/2014   Type 2 diabetes mellitus with diabetic neuropathy, with long-term current use of insulin ([place]) 08/31/2013   Hypertension associated with diabetes ([place]) 05/03/2013   Hyperlipidemia 05/03/2013   Hypothyroidism 05/03/2013      Review of Systems  All other systems reviewed and are negative.     Objective:     BP 100/68 (BP Location: Right Arm, Patient Position: Sitting, Cuff Size: Large)   Pulse 73   Temp 98.1 F (36.7 C) (Oral)   Ht '6\' 1"'  (1.854 m)    Wt (!) 301 lb 4.8 oz (136.7 kg)   SpO2 98%   BMI 39.75 kg/m  BP Readings from Last 3 Encounters:  01/22/22 100/68  12/23/21 132/60  11/18/21 (!) 158/84   Wt Readings from Last 3 Encounters:  01/22/22 (!) 301 lb 4.8 oz (136.7 kg)  12/23/21 (!) 303 lb (137.4 kg)  11/18/21 (!) 301 lb 9.6 oz (136.8 kg)      Physical Exam Vitals reviewed.  Constitutional:      Appearance: Normal appearance. He is well-groomed. He is obese.  Eyes:     Extraocular Movements: Extraocular movements intact.     Conjunctiva/sclera: Conjunctivae normal.  Neck:     Thyroid: No thyromegaly.  Cardiovascular:     Rate and Rhythm: Normal rate and regular rhythm.     Heart sounds: S1 normal and S2 normal. No murmur heard. Pulmonary:     Effort: Pulmonary effort is normal.     Breath sounds: Normal breath sounds and air entry. No rales.  Abdominal:     General: Abdomen is flat. Bowel sounds are normal.  Musculoskeletal:     Right lower leg: No edema.     Left lower leg: No edema.  Neurological:     General: No focal deficit present.     Mental Status: He is alert and oriented to person, place, and time.     Gait: Gait is intact.  Psychiatric:        Mood and Affect: Mood and affect normal.      No results found for any visits on 01/22/22.  Last metabolic panel Lab Results  Component Value Date   GLUCOSE 148 (H) 10/28/2021   NA 142 10/28/2021   K 3.8 10/28/2021   CL 105 10/28/2021   CO2 29 10/28/2021   BUN 12 10/28/2021   CREATININE 1.34 10/28/2021   EGFR 60 08/30/2021   CALCIUM 9.2 10/28/2021   PHOS 4.2 08/05/2020   PROT 6.9 10/28/2021   ALBUMIN 4.2 10/28/2021   BILITOT 0.6 10/28/2021   ALKPHOS 53 10/28/2021   AST 21 10/28/2021   ALT 17 10/28/2021   ANIONGAP 8 07/07/2021   Last hemoglobin A1c Lab Results  Component Value Date   HGBA1C 8.3 (H) 10/28/2021      The ASCVD Risk score (Arnett DK, et al., 2019) failed to calculate for the following reasons:   The valid total  cholesterol range is 130 to 320 mg/dL    Assessment & Plan:   Problem List Items Addressed This Visit       Cardiovascular and Mediastinum   Hypertension associated with diabetes ([place]) - Primary   Relevant Medications   tadalafil (CIALIS) 20 MG tablet     Endocrine   Type 2 diabetes mellitus with diabetic neuropathy, with long-term current use of insulin ([place])   Other Visit Diagnoses  Other male erectile dysfunction       Relevant Medications   tadalafil (CIALIS) 20 MG tablet   Immunization due       Relevant Orders   Pneumococcal conjugate vaccine 20-valent (Prevnar 20)       Return in about 6 months (around 07/24/2022) for Follow up visit.    Farrel Conners, MD

## 2022-01-22 NOTE — Patient Instructions (Signed)
Go to the website www.goodrx.com to download coupons for the Cialis

## 2022-01-23 NOTE — Assessment & Plan Note (Signed)
Current hypertension medications:      Sig   carvedilol (COREG) 6.25 MG tablet (Taking) TAKE 1 TABLET BY MOUTH TWICE A DAY WITH FOOD   furosemide (LASIX) 20 MG tablet (Taking) Take 1 tablet (20 mg total) by mouth daily.   losartan (COZAAR) 50 MG tablet (Taking) TAKE 1 TABLET BY MOUTH EVERY DAY     BP is well controlled in the office today, will continue the above medications as prescribed.

## 2022-01-23 NOTE — Progress Notes (Signed)
Chronic Care Management Pharmacy Note  01/24/2022 Name:  LORRY ANASTASI MRN:  [id number] DOB:  Oct 28, 1955  Summary: A1c not at goal < 7% BP not at goal < 130/80  Recommendations/Changes made from today's visit: -Recommended purchasing a BP cuff to monitor at home -Recommended restarting Ozempic and coordinated with endocrinology -Recommended prescribing CGM for blood sugar monitoring -Transition to adherence packaging with [facility]  Plan: Pt will purchase a BP cuff Follow up BP and DM assessment in 1 month PAP for Ozempic   Subjective: ZAION HREHA is an 66 y.o. year old male who is a primary patient of [name], Royston Cowper, MD.  The CCM team was consulted for assistance with disease management and care coordination needs.    Engaged with patient by telephone for follow up visit in response to provider referral for pharmacy case management and/or care coordination services.   Consent to Services:  The patient was given information about Chronic Care Management services, agreed to services, and gave verbal consent prior to initiation of services.  Please see initial visit note for detailed documentation.   Patient Care Team: Farrel Conners, MD as PCP - General (Family Medicine) Lorretta Harp, MD as PCP - Cardiology (Cardiology) Evans Lance, MD as PCP - Electrophysiology (Cardiology) Adegoroye, Wynona [place], MD (Specialist) Elayne Snare, MD as Consulting Physician (Endocrinology) Viona Gilmore, [facility] as Pharmacist (Pharmacist)  Recent office visits: 01/22/22 Loralyn Freshwater, MD: Patient presented for a TOC visit. Requested follow up with CCM team for medication affordability. Prescribed Cialis PRN.   12/23/21 Grier Mitts, MD: Patient presented for dizziness follow up. Referred to gastroenterology.   11/18/21 Grier Mitts, MD: Patient presented for dizziness. BP elevated in office.   Recent consult visits: 11/01/21 Elayne Snare MD (endocrinology): Patient  was seen for uncontrolled type 2 diabetes mellitus follow up.  09/11/2021 [name] MD (cardiology) - Patient was seen for Chronic combined systolic and diastolic heart failure and an additional issue. No medication changes. Follow up in 1 year.    08/23/2021 Christen Bame NP (cardiology) -  Patient was seen for Dilated cardiomyopathy and additional issues. Started Entresto 49/51 mg twice daily. Discontinued Losartan. Follow up in 2 months.    08/21/2021 Elayne Snare MD (endocrinology) - Patient was seen for Uncontrolled type 2 diabetes mellitus with hyperglycemia, with long-term current use of insulin and additional issues. Increased Semaglutide to 19m/3ml inject 1 mg weekly. Follow up in 10 weeks.    08/15/2021 Masoud Hejazi (psychiatry) - Patient was seen for Bipolar disorder, current episode depressed, moderate. No additional chart notes.    07/24/2021 Masoud Hejazi (psychiatry) - Patient was seen for Bipolar disorder, current episode depressed, moderate. No additional chart notes.    Hospital visits: None    Objective:  Lab Results  Component Value Date   CREATININE 1.34 10/28/2021   BUN 12 10/28/2021   GFR 55.54 (L) 10/28/2021   GFRNONAA >60 07/07/2021   GFRAA >90 04/18/2011   NA 142 10/28/2021   K 3.8 10/28/2021   CALCIUM 9.2 10/28/2021   CO2 29 10/28/2021   GLUCOSE 148 (H) 10/28/2021    Lab Results  Component Value Date/Time   HGBA1C 8.3 (H) 10/28/2021 11:44 AM   HGBA1C 6.9 (A) 08/21/2021 11:05 AM   HGBA1C 7.5 (H) 04/09/2021 10:55 AM   FRUCTOSAMINE 290 (H) 08/15/2021 09:06 AM   FRUCTOSAMINE 301 (H) 06/18/2021 11:18 AM   GFR 55.54 (L) 10/28/2021 11:44 AM   GFR 52.77 (L) 08/15/2021 09:06 AM  MICROALBUR 1.9 02/28/2021 08:52 AM   MICROALBUR 1.1 07/14/2019 03:14 PM    Last diabetic Eye exam:  Lab Results  Component Value Date/Time   HMDIABEYEEXA No Retinopathy 01/11/2019 12:00 AM    Last diabetic Foot exam: No results found for: "HMDIABFOOTEX"   Lab Results   Component Value Date   CHOL 91 10/28/2021   HDL 23.50 (L) 10/28/2021   LDLCALC 38 10/28/2021   LDLDIRECT 73.0 09/01/2017   TRIG 144.0 10/28/2021   CHOLHDL 4 10/28/2021       Latest Ref Rng & Units 10/28/2021   11:44 AM 07/07/2021    4:03 PM 02/28/2021    8:52 AM  Hepatic Function  Total Protein 6.0 - 8.3 g/dL 6.9  6.8  6.7   Albumin 3.5 - 5.2 g/dL 4.2  4.0  4.0   AST 0 - 37 U/L _0 ALT 0 - 53 U/L _1 Alk Phosphatase 39 - 117 U/L 53  50  51   Total Bilirubin 0.2 - 1.2 mg/dL 0.6  0.6  0.7     Lab Results  Component Value Date/Time   TSH 2.39 06/18/2021 02:11 PM   TSH 1.28 02/28/2021 08:52 AM   FREET4 0.83 06/18/2021 02:11 PM   FREET4 0.79 05/21/2020 10:57 AM       Latest Ref Rng & Units 07/07/2021    4:03 PM 05/14/2021    2:24 PM 02/28/2021    8:52 AM  CBC  WBC 4.0 - 10.5 K/uL 6.2  6.2  5.4   Hemoglobin 13.0 - 17.0 g/dL 12.6  13.1  12.7   Hematocrit 39.0 - 52.0 % 39.3  40.7  39.8   Platelets 150 - 400 K/uL 132  133  116.0     Lab Results  Component Value Date/Time   VD25OH 43.46 02/28/2021 08:52 AM    Clinical ASCVD: No  The ASCVD Risk score (Arnett DK, et al., 2019) failed to calculate for the following reasons:   The valid total cholesterol range is 130 to 320 mg/dL       12/23/2021   10:24 AM 02/25/2021   11:21 AM 10/05/2020    2:30 PM  Depression screen PHQ 2/9  Decreased Interest 2 0 0  Down, Depressed, Hopeless 1 0 0  PHQ - 2 Score 3 0 0  Altered sleeping 0  0  Tired, decreased energy 0  0  Change in appetite 2  0  Feeling bad or failure about yourself  0  0  Trouble concentrating 0  0  Moving slowly or fidgety/restless 0  0  Suicidal thoughts 0  0  PHQ-9 Score 5  0  Difficult doing work/chores Somewhat difficult       Social History   Tobacco Use  Smoking Status Never  Smokeless Tobacco Never   BP Readings from Last 3 Encounters:  01/24/22 132/70  01/22/22 100/68  12/23/21 132/60   Pulse Readings from Last 3 Encounters:   01/22/22 73  12/23/21 91  11/18/21 (!) 104   Wt Readings from Last 3 Encounters:  01/22/22 (!) 301 lb 4.8 oz (136.7 kg)  12/23/21 (!) 303 lb (137.4 kg)  11/18/21 (!) 301 lb 9.6 oz (136.8 kg)   BMI Readings from Last 3 Encounters:  01/22/22 39.75 kg/m  12/23/21 39.98 kg/m  11/18/21 39.79 kg/m    Assessment/Interventions: Review of patient past medical history, allergies, medications, health status, including review of consultants reports, laboratory and  other test data, was performed as part of comprehensive evaluation and provision of chronic care management services.   SDOH:  (Social Determinants of Health) assessments and interventions performed: Yes  SDOH Interventions    Flowsheet Row Chronic Care Management from 01/24/2022 in [place] at [place] Management from 02/05/2021 in [place] at [place]  SDOH Interventions    Transportation Interventions -- Intervention Not Indicated  Financial Strain Interventions Other (Comment)  [working on patient assistance for Ozempic] Other (Comment)  [working on PAP]       [place]: No Food Insecurity (02/25/2021)  Housing: Low Risk  (02/15/2020)  Transportation Needs: No Transportation Needs (02/25/2021)  Depression (PHQ2-9): Medium Risk (12/23/2021)  Financial Resource Strain: Medium Risk (01/24/2022)  Physical Activity: Inactive (02/25/2021)  Social Connections: Socially Isolated (02/15/2020)  Stress: No Stress Concern Present (02/25/2021)  Tobacco Use: Low Risk  (01/22/2022)    [place]  Allergies  Allergen Reactions   Influenza Vaccines Swelling and Other (See Comments)    Reports of swelling of the arm and then was "sick" for 10 days    Medications Reviewed Today     Reviewed by Viona Gilmore, [facility] (Pharmacist) on 01/24/22 at 22  Med List Status: <None>   Medication Order Taking? Sig Documenting Provider Last Dose Status Informant  ACCU-CHEK  GUIDE test strip [id number] No USE TO CHECK BLOOD SUGAR 2 TIMES DAILY Elayne Snare, MD Taking Active   Accu-Chek Softclix Lancets lancets [id number] No Check blood sugar 2 times daily Elayne Snare, MD Taking Active Self  atorvastatin (LIPITOR) 20 MG tablet [id number] No Take 1 tablet (20 mg total) by mouth daily. Kennyth Arnold, FNP Taking Active   Blood Glucose Monitoring Suppl (ACCU-CHEK GUIDE) w/Device KIT [id number]  Check blood sugar 2 times daily Elayne Snare, MD  Active Self  carvedilol (COREG) 6.25 MG tablet [id number] No TAKE 1 TABLET BY MOUTH TWICE A DAY WITH FOOD Farrel Conners, MD Taking Active   digoxin (LANOXIN) 0.125 MG tablet [id number] No TAKE 1 TABLET BY MOUTH EVERY DAY Koberlein, Junell C, MD Taking Active   furosemide (LASIX) 20 MG tablet [id number] No Take 1 tablet (20 mg total) by mouth daily. Farrel Conners, MD Taking Active   glucose blood [facility] VERIO) test strip [id number] No USE AS INSTRUCTED TO CHECK BLOOD SUGAR 2 TIMES A DAY. Elayne Snare, MD Taking Active Self  Insulin Syringe-Needle U-100 (INSULIN SYRINGE 1CC/31GX5/16") 31G X 5/16" 1 ML MISC [id number] No Use 3 needles per day Elayne Snare, MD Taking Active Self  lithium carbonate 300 MG capsule [id number] No Take 3 capsules (900 mg total) by mouth daily. Caren Macadam, MD Taking Active Self  losartan (COZAAR) 50 MG tablet [id number] No TAKE 1 TABLET BY MOUTH EVERY DAY Swinyer, Lanice Schwab, NP Taking Active   NOVOLIN 70/30 (70-30) 100 UNIT/ML injection [id number] No INJECT 40 UNITS INTO THE SKIN 2 (TWO) TIMES DAILY WITH A MEAL. Elayne Snare, MD Taking Active   omeprazole (PRILOSEC) 20 MG capsule [id number] No Take 1 capsule (20 mg total) by mouth daily. Billie Ruddy, MD Taking Active   QUEtiapine (SEROQUEL) 200 MG tablet [id number] No Take 600 mg by mouth at bedtime. Per patient taking 500 mg [provider] Taking Active Self  sildenafil (VIAGRA) 50 MG tablet [id number] No Take 1 tablet (50 mg total) by mouth  daily as needed for erectile dysfunction. Do not take more then once in 24 hours. Lucretia Kern,  DO Taking Active Self  tadalafil (CIALIS) 20 MG tablet [id number]  Take 0.5-1 tablets (10-20 mg total) by mouth every other day as needed for erectile dysfunction. Farrel Conners, MD  Active             Patient Active Problem List   Diagnosis Date Noted   ICD (implantable cardioverter-defibrillator) in place 09/11/2021   Morbid obesity ([place]) 04/12/2021   Other secondary pulmonary hypertension ([place]) 04/12/2021   Chronic kidney disease, stage 3b ([place]) 04/12/2021   Dilated cardiomyopathy (HCC)    Elevated troponin level not due to acute coronary syndrome    Chronic combined systolic and diastolic heart failure ([place]) 08/06/2020   AMS (altered mental status) 07/31/2020   Aphasia 07/31/2020   Difficulty with speech 07/30/2020   Chest pain 03/26/2018   Dyspnea on exertion 03/26/2018   Family history of heart disease 03/26/2018   Leg edema 10/27/2016   Bipolar affective, mixed ([place]) 04/29/2016   Esophageal reflux 01/17/2014   Type 2 diabetes mellitus with diabetic neuropathy, with long-term current use of insulin ([place]) 08/31/2013   Hypertension associated with diabetes ([place]) 05/03/2013   Hyperlipidemia 05/03/2013   Hypothyroidism 05/03/2013    Immunization History  Administered Date(s) Administered   Influenza,inj,Quad PF,6+ Mos 01/17/2014, 03/21/2020   Moderna Sars-Covid-2 Vaccination 08/30/2019, 10/04/2019, 02/29/2020   PNEUMOCOCCAL CONJUGATE-20 01/22/2022   Pneumococcal Polysaccharide-23 01/17/2014   Tdap 10/23/2021   Zoster Recombinat (Shingrix) 06/25/2021, 09/25/2021   Patient reports he is off of the Ozempic due to cost. He also never started on the CGM because he wasn't sure what companies to send it to. He seems to be overwhelmed with his medications and care in general.  Patient reports his blood sugars are much better now. He does still see some in the 200s later in the  day and has cloudy vision when it's higher.  Patient is not checking blood pressure at home at all right now despite drastically different office BP readings.  Conditions to be addressed/monitored:  Hypertension, Hyperlipidemia, Diabetes, Heart Failure, GERD, Hypothyroidism, and Bipolar disorder  Conditions addressed this visit: Diabetes, hypertension, heart failure  Care Plan : [place]  Updates made by Viona Gilmore, [place] since 01/24/2022 12:00 AM     Problem: Problem: Hypertension, Hyperlipidemia, Diabetes, Heart Failure, GERD, Hypothyroidism, and Bipolar disorder      Long-Range Goal: Patient-Specific Goal   Start Date: 02/05/2021  Expected End Date: 02/05/2022  Recent Progress: On track  Priority: High  Note:   Current Barriers:  Unable to independently afford treatment regimen Unable to independently monitor therapeutic efficacy Unable to achieve control of diabetes  Unable to maintain control of blood pressure Unable to self administer medications as prescribed  Pharmacist Clinical Goal(s):  Patient will verbalize ability to afford treatment regimen achieve adherence to monitoring guidelines and medication adherence to achieve therapeutic efficacy achieve control of diabetes as evidenced by A1c maintain control of blood pressure as evidenced by home and office blood pressure readings  through collaboration with PharmD and provider.   Interventions: 1:1 collaboration with Farrel Conners, MD regarding development and update of comprehensive plan of care as evidenced by provider attestation and co-signature Inter-disciplinary care team collaboration (see longitudinal plan of care) Comprehensive medication review performed; medication list updated in electronic medical record  Hypertension (BP goal <130/80) -Uncontrolled -Current treatment: Losartan 50 mg 1 tablet daily - Appropriate, Query effective, Safe, Accessible -Medications previously tried:  amlodipine  -Current home readings: does not have a cuff -Current  dietary habits: does not pay attention to sodium intake -Current exercise habits: unable to with shortness of breath -Denies hypotensive/hypertensive symptoms -Educated on BP goals and benefits of medications for prevention of heart attack, stroke and kidney damage; Daily salt intake goal < 2300 mg; Importance of home blood pressure monitoring; Proper BP monitoring technique; Symptoms of hypotension and importance of maintaining adequate hydration; -Counseled to monitor BP at home weekly, document, and provide log at future appointments -Counseled on diet and exercise extensively Recommended to continue current medication  Hyperlipidemia: (LDL goal < 70) -Controlled -Current treatment: Atorvastatin 20 mg 1 tablet daily - Appropriate, Effective, Safe, Accessible -Medications previously tried: none  -Current dietary patterns: eats out some due to job -Current exercise habits: unable to with shortness of breath -Educated on Cholesterol goals;  Benefits of statin for ASCVD risk reduction; -Counseled on diet and exercise extensively Recommended to continue current medication  Diabetes (A1c goal <7%) -Not ideally controlled -Current medications: Novolin 70/30 inject 60 units daily in the morning and 50 units in the evening - Appropriate, Query effective, Safe, Accessible Ozempic 0.5 mg inject once weekly -  not taking -Medications previously tried: Geneticist, molecular (kidney function) -Current home glucose readings fasting glucose: 140; checking 2 times a day (varies with eating) post prandial glucose: does see some 200s -Denies hypoglycemic/hyperglycemic symptoms -Current meal patterns:  breakfast: did not discuss in detail  lunch: did not discuss in detail  dinner: did not discuss in detail  snacks: did not discuss in detail  drinks: did not discuss in detail  -Current exercise: unable to right now -Educated on A1c and  blood sugar goals; Benefits of routine self-monitoring of blood sugar; Continuous glucose monitoring; Carbohydrate counting and/or plate method -Counseled to check feet daily and get yearly eye exams -Counseled on diet and exercise extensively Recommended to continue current medication Recommended starting continuous glucose monitoring. Assessed patient finances. Patient would qualify for Ozempic PAP.  Heart Failure (Goal: manage symptoms and prevent exacerbations) -Uncontrolled -Last ejection fraction: 20-25% (Date: 08/03/20) -HF type: Left Ventricular Failure -NYHA Class: III (marked limitation of activity) -AHA HF Stage: C (Heart disease and symptoms present) -Current treatment: Losartan 50 mg 1 tablet daily - Appropriate, Effective, Safe, Accessible Carvedilol 6.25 mg 1 tablet twice daily with a meal - Appropriate, Effective, Safe, Accessible Furosemide 20 mg 1 tablet daily - Appropriate, Effective, Safe, Accessible Digoxin 0.125 mg 1 tablet daily - Appropriate, Effective, Safe, Accessible -Medications previously tried: none  -Current home BP/HR readings: not checking -Current dietary habits: tries not to add salt (fast food - tries to come home and eat when he can); eats deli meat wants faster food  -Current exercise habits: unable to and does not have energy -Educated on Benefits of medications for managing symptoms and prolonging life Importance of weighing daily; if you gain more than 3 pounds in one day or 5 pounds in one week, call cardiology. Importance of blood pressure control -Counseled on diet and exercise extensively Recommended to continue current medication  Bipolar disorder(Goal: minimize symptoms) -Controlled -Current treatment: Lithium 300 mg 3 capsules daily - Appropriate, Effective, Safe, Accessible Quetiapine 200 mg 3 tablets at bedtime - Appropriate, Effective, Safe, Accessible -Medications previously tried/failed: n/a -PHQ9: 0 -GAD7: n/a -Educated on  Benefits of medication for symptom control Benefits of cognitive-behavioral therapy with or without medication -Counseled on diet and exercise extensively Recommended to continue current medication  GERD (Goal: minimize symptoms) -Controlled -Current treatment  Omeprazole 20 mg 1 tablet daily before meals - Appropriate,  Effective, Safe, Accessible -Medications previously tried: omeprazole  -Recommended to continue current medication  Health Maintenance -Vaccine gaps: shingrix, COVID booster -Current therapy:  Sildenafil 50 mg as needed Albuterol HFA as needed -Educated on Cost vs benefit of each product must be carefully weighed by individual consumer -Patient is satisfied with current therapy and denies issues -Recommended to continue current medication  Patient Goals/Self-Care Activities Patient will:  - take medications as prescribed as evidenced by patient report and record review check glucose 3 times daily, document, and provide at future appointments check blood pressure weekly, document, and provide at future appointments  Follow Up Plan: The care management team will reach out to the patient again over the next 30 days.         Medication Assistance: Application for Ozempic  medication assistance program. in process.  Anticipated assistance start date 02/24/22.  See plan of care for additional detail.  Compliance/Adherence/Medication fill history: Care Gaps: Cologuard, COVID booster Last BP - 140/85 on 06/21/2021 Last A1C - 7.5 on 04/09/2021  Star-Rating Drugs: Atorvastatin 20 mg - last [facility] Losartan 50 mg - last filled [facility]/08/2021 [facility] Ozempic 0.5 mg - not taking due to cost  Patient's preferred pharmacy is:  [facility] [place], [address] [address] Phone: [phone] Fax: [phone]   Uses pill box? Yes Pt endorses 80% compliance  We discussed: Benefits of  medication synchronization, packaging and delivery as well as enhanced pharmacist oversight with [facility]. Patient decided to: Utilize [facility] for medication synchronization, packaging and delivery  Care Plan and Follow Up Patient Decision:  Patient agrees to Care Plan and Follow-up.  Plan: The care management team will reach out to the patient again over the next 30 days.  Jeni Salles, PharmD, [place] Pharmacist [place] [facility] [place]

## 2022-01-23 NOTE — Assessment & Plan Note (Signed)
Was on Ozempic however pt reports that when he tried to have it filled the last time it was >1000 dollars. I will speak with Maddie about this (pt already has a CCM consult) to see if there are other medications that would be more affordable for him to take.

## 2022-01-24 ENCOUNTER — Telehealth: Payer: Self-pay | Admitting: Nurse Practitioner

## 2022-01-24 ENCOUNTER — Ambulatory Visit: Payer: Medicare HMO | Admitting: Pharmacist

## 2022-01-24 ENCOUNTER — Telehealth: Payer: Self-pay | Admitting: Pharmacist

## 2022-01-24 VITALS — BP 132/70

## 2022-01-24 DIAGNOSIS — E1159 Type 2 diabetes mellitus with other circulatory complications: Secondary | ICD-10-CM

## 2022-01-24 DIAGNOSIS — E1140 Type 2 diabetes mellitus with diabetic neuropathy, unspecified: Secondary | ICD-10-CM

## 2022-01-24 NOTE — Chronic Care Management (AMB) (Cosign Needed)
Chronic Care Management Pharmacy Assistant   Name: Matthew Barnes  MRN: [id number] DOB: Aug 17, 1955  Reason for Encounter: [facility] onboarding   Referred by: Farrel Conners, MD   Reason for referral: Chronic care management  Reviewed chart for medication changes ahead of medication coordination call.  BP Readings from Last 3 Encounters:  01/24/22 132/70  01/22/22 100/68  12/23/21 132/60    Lab Results  Component Value Date   HGBA1C 8.3 (H) 10/28/2021    Verbal consent obtained for [facility] enhanced pharmacy services (medication synchronization, adherence packaging, delivery coordination). A medication sync plan was created to allow patient to get all medications delivered once every 30 to 90 days per patient preference. Patient understands they have freedom to choose pharmacy and clinical pharmacist will coordinate care between all prescribers and [facility].  Patient requested to obtain medications through Adherence Packaging  30 Days   Med Sync Plan: Atorvastatin 20 mg X       1  01/17/2022 30 DS February 14, 2022  Carvedilol 6.25 mg X    1   1  01/02/2022 30 DS January 31, 2022  Digoxin 125 mcg X       1  #50 March 14, 2022  Furosemide 20 mg X    1     01/11/2022 30 DS February 10, 2022  Lithium 300 mg  Janeth Rase NP January 24, 2022     3  11/06/2021 90 DS February 04, 2022  Losartan 50 mg  Christen Bame NP     1  11/08/2021 90 DS February 06, 2022  Omeprazole 20 mg X    1     01/06/2022 90 DS April 04, 2022  Novolin 70/30 - inject 40 units 2 times daily with a meal Elayne Snare MD January 24, 2022       01/06/2022 25 DS January 31, 2022  Quetiapine 200 mg  Brayton Caves MD January 24, 2022     3  01/16/2022 90 DS  April 16, 2022    Patient will need a short fill prior to adherence delivery.:  Prescriptions requested from PCP and specialists: Requested, left message, for new prescriptions of  Lithium and Quetiapine to be sent to [facility].   Requested, left message, for a new prescription of Novolin 70/30 to be sent to [facility]. Spoke with Sunday Spillers at [facility] Christen Bame NP) a new prescription for Losartan to be sent to [facility].  Medications: Outpatient Encounter Medications as of 01/24/2022  Medication Sig   ACCU-CHEK GUIDE test strip USE TO CHECK BLOOD SUGAR 2 TIMES DAILY   Accu-Chek Softclix Lancets lancets Check blood sugar 2 times daily   atorvastatin (LIPITOR) 20 MG tablet Take 1 tablet (20 mg total) by mouth daily.   Blood Glucose Monitoring Suppl (ACCU-CHEK GUIDE) w/Device KIT Check blood sugar 2 times daily   carvedilol (COREG) 6.25 MG tablet TAKE 1 TABLET BY MOUTH TWICE A DAY WITH FOOD   digoxin (LANOXIN) 0.125 MG tablet TAKE 1 TABLET BY MOUTH EVERY DAY   furosemide (LASIX) 20 MG tablet Take 1 tablet (20 mg total) by mouth daily.   glucose blood (ONETOUCH VERIO) test strip USE AS INSTRUCTED TO CHECK BLOOD SUGAR 2 TIMES A DAY.   Insulin Syringe-Needle U-100 (INSULIN SYRINGE 1CC/31GX5/16") 31G X 5/16" 1 ML MISC Use 3 needles per day   lithium carbonate 300 MG capsule Take 3 capsules (900 mg total) by mouth daily.   losartan (COZAAR) 50  MG tablet TAKE 1 TABLET BY MOUTH EVERY DAY   NOVOLIN 70/30 (70-30) 100 UNIT/ML injection INJECT 40 UNITS INTO THE SKIN 2 (TWO) TIMES DAILY WITH A MEAL.   omeprazole (PRILOSEC) 20 MG capsule Take 1 capsule (20 mg total) by mouth daily.   QUEtiapine (SEROQUEL) 200 MG tablet Take 600 mg by mouth at bedtime. Per patient taking 500 mg   sildenafil (VIAGRA) 50 MG tablet Take 1 tablet (50 mg total) by mouth daily as needed for erectile dysfunction. Do not take more then once in 24 hours.   tadalafil (CIALIS) 20 MG tablet Take 0.5-1 tablets (10-20 mg total) by mouth every other day as needed for erectile dysfunction.   No facility-administered encounter medications on file as of 01/24/2022.   Care Gaps: AWV - scheduled 03/10/2022 Last BP - 100/68 on  01/22/2022 Last A1C - 8.3 on 10/28/2021 Cologuard - overdue AWV - due soon Covid - postponed  Star Rating Drugs: Atorvastatin 20 mg - last [facility] Losartan 50 mg - last filled 11/08/2021 90 DS at [place] Pharmacist Assistant [phone]

## 2022-01-24 NOTE — Patient Instructions (Addendum)
Company for your continuous glucose monitor: [employer]: [phone] Try to get a blood pressure cuff [facility] [phone]  [place], PharmD, [facility] Pharmacist [place] [facility] [place]

## 2022-01-24 NOTE — Telephone Encounter (Signed)
*  STAT* If patient is at the pharmacy, call can be transferred to refill team.   1. Which medications need to be refilled? (please list name of each medication and dose if known) new prescription for Losartan- changing pharmacy  2. Which pharmacy/location (including street and city if local pharmacy) is medication to be sent to? [facility]  3. Do they need a 30 day or 90 day supply? 30 days and refills

## 2022-01-27 ENCOUNTER — Other Ambulatory Visit: Payer: Self-pay | Admitting: Endocrinology

## 2022-01-27 ENCOUNTER — Other Ambulatory Visit: Payer: Self-pay

## 2022-01-27 DIAGNOSIS — E1165 Type 2 diabetes mellitus with hyperglycemia: Secondary | ICD-10-CM

## 2022-01-27 MED ORDER — LOSARTAN POTASSIUM 50 MG PO TABS: 50.0000 mg | ORAL_TABLET | Freq: Every day | ORAL | 2 refills | Status: DC

## 2022-01-27 MED ORDER — NOVOLIN 70/30 (70-30) 100 UNIT/ML ~~LOC~~ SUSP: 40.0000 [IU] | Freq: Two times a day (BID) | SUBCUTANEOUS | 3 refills | Status: DC

## 2022-01-27 NOTE — Telephone Encounter (Signed)
Pt's medication was sent to pt's pharmacy as requested. Confirmation received.  °

## 2022-01-28 ENCOUNTER — Other Ambulatory Visit: Payer: Self-pay | Admitting: Family Medicine

## 2022-01-28 DIAGNOSIS — M9902 Segmental and somatic dysfunction of thoracic region: Secondary | ICD-10-CM | POA: Diagnosis not present

## 2022-01-28 DIAGNOSIS — M62830 Muscle spasm of back: Secondary | ICD-10-CM | POA: Diagnosis not present

## 2022-01-28 DIAGNOSIS — M9901 Segmental and somatic dysfunction of cervical region: Secondary | ICD-10-CM | POA: Diagnosis not present

## 2022-01-28 DIAGNOSIS — M546 Pain in thoracic spine: Secondary | ICD-10-CM | POA: Diagnosis not present

## 2022-01-29 ENCOUNTER — Other Ambulatory Visit: Payer: Self-pay | Admitting: *Deleted

## 2022-01-29 DIAGNOSIS — I5023 Acute on chronic systolic (congestive) heart failure: Secondary | ICD-10-CM

## 2022-01-29 DIAGNOSIS — K219 Gastro-esophageal reflux disease without esophagitis: Secondary | ICD-10-CM

## 2022-01-29 DIAGNOSIS — E7800 Pure hypercholesterolemia, unspecified: Secondary | ICD-10-CM

## 2022-01-29 MED ORDER — DIGOXIN 125 MCG PO TABS: 125.0000 ug | ORAL_TABLET | Freq: Every day | ORAL | 1 refills | Status: DC

## 2022-01-29 MED ORDER — FUROSEMIDE 20 MG PO TABS: 20.0000 mg | ORAL_TABLET | Freq: Every day | ORAL | 1 refills | Status: DC

## 2022-01-29 MED ORDER — OMEPRAZOLE 20 MG PO CPDR: 20.0000 mg | DELAYED_RELEASE_CAPSULE | Freq: Every day | ORAL | 1 refills | Status: DC

## 2022-01-29 MED ORDER — CARVEDILOL 6.25 MG PO TABS: 6.2500 mg | ORAL_TABLET | Freq: Two times a day (BID) | ORAL | 1 refills | Status: DC

## 2022-01-29 MED ORDER — ATORVASTATIN CALCIUM 20 MG PO TABS: 20.0000 mg | ORAL_TABLET | Freq: Every day | ORAL | 1 refills | Status: DC

## 2022-01-29 NOTE — Telephone Encounter (Signed)
-----   Message from Viona Gilmore, [facility] sent at 01/28/2022 11:31 AM EDT ----- Regarding: Refills Hi,  I sent a message yesterday about refills for him but I think they may have been sent to the wrong pharmacy. Can you possible resend them to [facility]? He needs the following:  -carvedilol -atorvastatin -digoxin -furosemide -omeprazole  Thanks! Maddie

## 2022-01-29 NOTE — Telephone Encounter (Signed)
Refills sent on Carvediolol and Furosemide.  Message sent to PCP for refills as Rxs for Atorvastatin, Digoxin and Omeprazole were previously given by a different provider.

## 2022-01-30 ENCOUNTER — Other Ambulatory Visit: Payer: Self-pay | Admitting: Endocrinology

## 2022-01-30 ENCOUNTER — Other Ambulatory Visit (INDEPENDENT_AMBULATORY_CARE_PROVIDER_SITE_OTHER): Payer: Medicare HMO

## 2022-01-30 DIAGNOSIS — E1165 Type 2 diabetes mellitus with hyperglycemia: Secondary | ICD-10-CM | POA: Diagnosis not present

## 2022-01-30 DIAGNOSIS — Z794 Long term (current) use of insulin: Secondary | ICD-10-CM | POA: Diagnosis not present

## 2022-01-30 DIAGNOSIS — R7989 Other specified abnormal findings of blood chemistry: Secondary | ICD-10-CM

## 2022-01-30 LAB — BASIC METABOLIC PANEL
BUN: 14 mg/dL (ref 6–23)
CO2: 26 mEq/L (ref 19–32)
Calcium: 9.4 mg/dL (ref 8.4–10.5)
Chloride: 104 mEq/L (ref 96–112)
Creatinine, Ser: 1.28 mg/dL (ref 0.40–1.50)
GFR: 58.57 mL/min — ABNORMAL LOW (ref >60.00)
Glucose, Bld: 276 mg/dL — ABNORMAL HIGH (ref 70–99)
Potassium: 4.2 mEq/L (ref 3.5–5.1)
Sodium: 140 mEq/L (ref 135–145)

## 2022-01-31 LAB — FRUCTOSAMINE: Fructosamine: 305 umol/L — ABNORMAL HIGH (ref 0–285)

## 2022-02-03 ENCOUNTER — Encounter: Payer: Medicare HMO | Admitting: Dietician

## 2022-02-03 DIAGNOSIS — M9901 Segmental and somatic dysfunction of cervical region: Secondary | ICD-10-CM | POA: Diagnosis not present

## 2022-02-03 DIAGNOSIS — M9902 Segmental and somatic dysfunction of thoracic region: Secondary | ICD-10-CM | POA: Diagnosis not present

## 2022-02-03 DIAGNOSIS — M62830 Muscle spasm of back: Secondary | ICD-10-CM | POA: Diagnosis not present

## 2022-02-03 DIAGNOSIS — M546 Pain in thoracic spine: Secondary | ICD-10-CM | POA: Diagnosis not present

## 2022-02-04 ENCOUNTER — Other Ambulatory Visit (INDEPENDENT_AMBULATORY_CARE_PROVIDER_SITE_OTHER): Payer: Medicare HMO

## 2022-02-04 ENCOUNTER — Ambulatory Visit: Payer: Medicare HMO | Admitting: Endocrinology

## 2022-02-04 ENCOUNTER — Encounter: Payer: Self-pay | Admitting: Endocrinology

## 2022-02-04 ENCOUNTER — Telehealth: Payer: Medicare HMO

## 2022-02-04 VITALS — BP 140/70 | HR 73 | Ht 73.0 in | Wt 300.6 lb

## 2022-02-04 DIAGNOSIS — R5383 Other fatigue: Secondary | ICD-10-CM

## 2022-02-04 DIAGNOSIS — Z794 Long term (current) use of insulin: Secondary | ICD-10-CM | POA: Diagnosis not present

## 2022-02-04 DIAGNOSIS — Z8639 Personal history of other endocrine, nutritional and metabolic disease: Secondary | ICD-10-CM | POA: Diagnosis not present

## 2022-02-04 DIAGNOSIS — E1165 Type 2 diabetes mellitus with hyperglycemia: Secondary | ICD-10-CM | POA: Diagnosis not present

## 2022-02-04 LAB — TSH: TSH: 1.9 u[IU]/mL (ref 0.35–5.50)

## 2022-02-04 NOTE — Patient Instructions (Addendum)
Take pm shot before dinner  Insulin 60 in am and 55 at dinner  Elliptical daily

## 2022-02-04 NOTE — Progress Notes (Signed)
Patient ID: Matthew Barnes, male   DOB: 1955/10/28, 66 y.o.   MRN: [id number]    Reason for Appointment: Followup for Type 2 Diabetes    History of Present Illness:          Diagnosis: Type 2 diabetes mellitus, date of diagnosis:   2011       Past history:  He was started on metformin at diagnosis and apparently did have fairly good control initially However about a year later because of poor control he was given insulin in addition. He had been taking Lantus insulin until about 2/15, up to 80 units a day However he does not think his blood sugars  Were controlled with this Because of higher A1c of 13.1% he was referred here for diabetes management in 6/15; was started on glucose monitoring and insulin was changed from low dose Levemir to Humalog mix 70 units a day He was also started on Victoza  to help with blood sugar control and facilitate weight loss on his initial consultation  Recent history:   INSULIN regimen is described as: Novolin mix 70/30 with syringe, 60 units at breakfast, 40 units in p.m  Oral hypoglycemic drugs the patient is taking are: None, was on Ozempic 0 mg weekly  A1c 8.3  This has been as low as 5.8 and in 5/22 was 8.3   Current blood sugar patterns, management of diabetes and problems: 8/23 He blood sugars are higher again Recently most of his blood sugars are over 200 He says that he did not continue 70 units insulin in the morning because of tendency to low sugars later in the day when he is not eating a consistent breakfast or lunch Not refill his Ozempic because it was very expensive in the donut hole but did not let us know  Again says that he is sometimes forgetting his evening dinner insulin especially when eating out Not consistently checking blood sugars at bedtime Weight is about the same He has a gym available in his apartment complex but is not using it   Side effects from medications have been: Polyuria from Jardiance  Glucose monitoring:   done about 1 times a day or less       Glucometer: Accu-Chek  Blood Glucose readings    PRE-MEAL Fasting Lunch Dinner Bedtime Overall  Glucose range: 155-309      Mean/median:     211   POST-MEAL PC Breakfast PC Lunch PC Dinner  Glucose range:   207-245  Mean/median:       Previously  PRE-MEAL Fasting Lunch Dinner Bedtime Overall  Glucose range: 155-201 187-250 132-179 181   Mean/median:     183    Glycemic control:   Lab Results  Component Value Date   HGBA1C 8.3 (H) 10/28/2021   HGBA1C 6.9 (A) 08/21/2021   HGBA1C 7.5 (H) 04/09/2021   Lab Results  Component Value Date   MICROALBUR 1.9 02/28/2021   LDLCALC 38 10/28/2021   CREATININE 1.28 01/30/2022   Lab Results  Component Value Date   FRUCTOSAMINE 305 (H) 01/30/2022   FRUCTOSAMINE 290 (H) 08/15/2021   FRUCTOSAMINE 301 (H) 06/18/2021    Self-care: The diet that the patient has been following is: None, unable to control portions and carbs  when depressed  Meals: 3 meals per day (dinner 6 pm-10 pm)        Dietician visit: Most recent: never      CDE visit: 08/2013  Weight history: baseline 295  Wt Readings from Last 3 Encounters:  02/04/22 (!) 300 lb 9.6 oz (136.4 kg)  01/22/22 (!) 301 lb 4.8 oz (136.7 kg)  12/23/21 (!) 303 lb (137.4 kg)   Lab on 01/30/2022  Component Date Value Ref Range Status   Fructosamine 01/30/2022 305 (H)  0 - 285 umol/L Final   Comment: Published reference interval for apparently healthy subjects between age 2 and 7 is 45 - 285 umol/L and in a poorly controlled diabetic population is 228 - 563 umol/L with a mean of 396 umol/L.    Sodium 01/30/2022 140  135 - 145 mEq/L Final   Potassium 01/30/2022 4.2  3.5 - 5.1 mEq/L Final   Chloride 01/30/2022 104  96 - 112 mEq/L Final   CO2 01/30/2022 26  19 - 32 mEq/L Final   Glucose, Bld 01/30/2022 276 (H)  70 - 99 mg/dL Final   BUN 01/30/2022 14  6 - 23 mg/dL Final   Creatinine, Ser 01/30/2022 1.28  0.40 - 1.50 mg/dL Final    GFR 01/30/2022 58.57 (L)  >60.00 mL/min Final   Calculated using the CKD-EPI Creatinine Equation (2021)   Calcium 01/30/2022 9.4  8.4 - 10.5 mg/dL Final      Allergies as of 02/04/2022       Reactions   Influenza Vaccines Swelling, Other (See Comments)   Reports of swelling of the arm and then was "sick" for 10 days        Medication List        Accurate as of February 04, 2022 10:25 AM. If you have any questions, ask your nurse or doctor.          Accu-Chek Guide w/Device Kit Check blood sugar 2 times daily   Accu-Chek Softclix Lancets lancets Check blood sugar 2 times daily   atorvastatin 20 MG tablet Commonly known as: LIPITOR Take 1 tablet (20 mg total) by mouth daily.   carvedilol 6.25 MG tablet Commonly known as: COREG Take 1 tablet (6.25 mg total) by mouth 2 (two) times daily with a meal.   digoxin 0.125 MG tablet Commonly known as: LANOXIN Take 1 tablet (125 mcg total) by mouth daily.   furosemide 20 MG tablet Commonly known as: LASIX Take 1 tablet (20 mg total) by mouth daily.   INSULIN SYRINGE 1CC/31GX5/16" 31G X 5/16" 1 ML Misc Use 3 needles per day   lithium carbonate 300 MG capsule Take 3 capsules (900 mg total) by mouth daily.   losartan 50 MG tablet Commonly known as: COZAAR Take 1 tablet (50 mg total) by mouth daily.   NovoLIN 70/30 (70-30) 100 UNIT/ML injection Generic drug: insulin NPH-regular Human Inject 40 Units into the skin 2 (two) times daily with a meal.   omeprazole 20 MG capsule Commonly known as: PRILOSEC Take 1 capsule (20 mg total) by mouth daily.   OneTouch Verio test strip Generic drug: glucose blood USE AS INSTRUCTED TO CHECK BLOOD SUGAR 2 TIMES A DAY.   Accu-Chek Guide test strip Generic drug: glucose blood USE TO CHECK BLOOD SUGAR 2 TIMES DAILY   QUEtiapine 200 MG tablet Commonly known as: SEROQUEL Take 600 mg by mouth at bedtime. Per patient taking 500 mg   sildenafil 50 MG tablet Commonly known as:  Viagra Take 1 tablet (50 mg total) by mouth daily as needed for erectile dysfunction. Do not take more then once in 24 hours.   tadalafil 20 MG tablet Commonly known as: CIALIS Take 0.5-1 tablets (  10-20 mg total) by mouth every other day as needed for erectile dysfunction.        Allergies:  Allergies  Allergen Reactions   Influenza Vaccines Swelling and Other (See Comments)    Reports of swelling of the arm and then was "sick" for 10 days    Past Medical History:  Diagnosis Date   Bipolar II disorder - Managed by Marye Round [phone]) 05/03/2013   Diabetes mellitus    Dysphagia    Hyperlipidemia    Hypertension    Unspecified hypothyroidism 05/03/2013    Past Surgical History:  Procedure Laterality Date   ICD IMPLANT N/A 05/20/2021   Procedure: ICD IMPLANT;  Surgeon: Evans Lance, MD;  Location: [facility];  Service: Cardiovascular;  Laterality: N/A;   KNEE SURGERY     scope on left knee   RIGHT/LEFT HEART CATH AND CORONARY ANGIOGRAPHY N/A 08/07/2020   Procedure: RIGHT/LEFT HEART CATH AND CORONARY ANGIOGRAPHY;  Surgeon: Leonie Man, MD;  Location: [facility];  Service: Cardiovascular;  Laterality: N/A;    Family History  Problem Relation Age of Onset   Diabetes Mother    Hypertension Mother    Heart disease Mother    Heart disease Father    Heart attack Father 63   Diabetes Father    Hypertension Father    Depression Father    Diabetes Sister    Depression Paternal Grandmother    Depression Other     Social History:  reports that he has never smoked. He has never used smokeless tobacco. He reports that he does not drink alcohol and does not use drugs.    Review of Systems   HYPERTENSION:  He is taking losartan 50 mg and carvedilol 6.25 mg from his cardiologist Blood pressure is relatively higher in the office  Blood pressure readings as follows:  BP Readings from Last 3 Encounters:  02/04/22 (!) 140/70  01/24/22 132/70   01/22/22 100/68   Renal dysfunction: He has mild fluctuation in his creatinine level  No previous history of microalbuminuria  Lab Results  Component Value Date   CREATININE 1.28 01/30/2022   CREATININE 1.34 10/28/2021   CREATININE 1.32 (H) 08/30/2021   HEART failure: He has had CHF and was in the emergency room for treatment in July Continues to be on Lasix and Lanoxin along with Coreg and Jardiance       LIPIDS: He has had marked increase in triglycerides previously, now back to normal.  Taking 20 mg Lipitor  Did not have any coronary artery disease  He is also followed by PCP       Lab Results  Component Value Date   CHOL 91 10/28/2021   HDL 23.50 (L) 10/28/2021   LDLCALC 38 10/28/2021   LDLDIRECT 73.0 09/01/2017   TRIG 144.0 10/28/2021   CHOLHDL 4 10/28/2021       Thyroid:  Previously had mild hypothyroidism for a few years TSH subsequently has been normal consistently without taking any thyroid supplements  Has been on lithium long-term  Lab Results  Component Value Date   TSH 2.39 06/18/2021        He has had some numbness in his feet or hands, more on the right side, using gabapentin more frequently now with increased symptoms  Findings on last exam:   Patchy decrease in sensation in the toes especially right and decreased on the plantar surface also   LABS:  Lab on 01/30/2022  Component Date Value  Ref Range Status   Fructosamine 01/30/2022 305 (H)  0 - 285 umol/L Final   Comment: Published reference interval for apparently healthy subjects between age 65 and 66 is 67 - 285 umol/L and in a poorly controlled diabetic population is 228 - 563 umol/L with a mean of 396 umol/L.    Sodium 01/30/2022 140  135 - 145 mEq/L Final   Potassium 01/30/2022 4.2  3.5 - 5.1 mEq/L Final   Chloride 01/30/2022 104  96 - 112 mEq/L Final   CO2 01/30/2022 26  19 - 32 mEq/L Final   Glucose, Bld 01/30/2022 276 (H)  70 - 99 mg/dL Final   BUN 01/30/2022 14  6 - 23 mg/dL  Final   Creatinine, Ser 01/30/2022 1.28  0.40 - 1.50 mg/dL Final   GFR 01/30/2022 58.57 (L)  >60.00 mL/min Final   Calculated using the CKD-EPI Creatinine Equation (2021)   Calcium 01/30/2022 9.4  8.4 - 10.5 mg/dL Final    Physical Examination:  BP (!) 140/70   Pulse 73   Ht _0  (1.854 m)   Wt (!) 300 lb 9.6 oz (136.4 kg)   SpO2 93%   BMI 39.66 kg/m      ASSESSMENT/PLAN:   Diabetes type 2, on insulin  See history of present illness for detailed discussion of his current management, blood sugar patterns and problems identified  A1c is 8.3 compared to 7.5 Fructosamine is 305  He is on a regimen of premixed insulin currently  His blood sugars are worse with not taking Ozempic Also not watching his diet as before BMI is about 40 now  He needs to increase his dinner insulin to 55-minute Keep an insulin pen with him when going out and possibly eating in the evening Encouraged him to exercise regularly and can use the elliptical We will try to get him the freestyle libre sensor again through the [employer] and [employer] He can use his phone app but if he needs any help starting we can have him see the nurse educator   Patient Instructions  Take pm shot before dinner  Insulin 60 in am and 55 at diiner  elliptical Total visit time including counseling = 30 minutes   Elayne Snare 02/04/2022, 10:25 AM   Note: This office note was prepared with Dragon voice recognition system technology. Any transcriptional errors that result from this process are unintentional.

## 2022-02-05 ENCOUNTER — Telehealth: Payer: Self-pay | Admitting: Pharmacist

## 2022-02-05 NOTE — Progress Notes (Signed)
    Chronic Care Management Pharmacy Assistant   Name: Matthew Barnes  MRN: [id number] DOB: 08/19/1955  Reason for Encounter: Follow up patient assistance for Ozempic.  Spoke with [name] at [employer], they do not have a history of this patient.  [name] suggested to refax his application to their priority fax # [phone].    [place] Pharmacist Assistant [phone]

## 2022-02-06 DIAGNOSIS — M9902 Segmental and somatic dysfunction of thoracic region: Secondary | ICD-10-CM | POA: Diagnosis not present

## 2022-02-06 DIAGNOSIS — M62830 Muscle spasm of back: Secondary | ICD-10-CM | POA: Diagnosis not present

## 2022-02-06 DIAGNOSIS — M9901 Segmental and somatic dysfunction of cervical region: Secondary | ICD-10-CM | POA: Diagnosis not present

## 2022-02-06 DIAGNOSIS — M546 Pain in thoracic spine: Secondary | ICD-10-CM | POA: Diagnosis not present

## 2022-02-06 NOTE — Chronic Care Management (AMB) (Signed)
Called and left a message for Dr. Andi Devon medical assistant to refax patient's PAP application to [employer]'s priority fax number.

## 2022-02-10 ENCOUNTER — Telehealth: Payer: Self-pay | Admitting: *Deleted

## 2022-02-10 NOTE — Telephone Encounter (Signed)
-----   Message from Farrel Conners, MD sent at 02/10/2022 11:05 AM EST ----- Regarding: RE: Refills Ok [facility]! ----- Message ----- From: Agnes Lawrence, CMA Sent: 01/27/2022   3:38 PM EST To: Farrel Conners, MD Subject: Melton Alar: Refills                                     ----- Message ----- From: Viona Gilmore, [facility] Sent: 01/27/2022  12:16 PM EDT To: Rolla Flatten Subject: Refills                                        Hi,  Mr. [place] is going to try out [facility] for their adherence packaging. Can you please send refills of the following to them? -carvedilol -atorvastatin -digoxin -furosemide -omeprazole  Thanks! Maddie

## 2022-02-10 NOTE — Telephone Encounter (Signed)
Rxs previously sent in on 11/1.

## 2022-02-10 NOTE — Telephone Encounter (Signed)
-----   Message from Farrel Conners, MD sent at 02/10/2022 11:05 AM EST ----- Regarding: RE: Refills Ok to [facility]! ----- Message ----- From: Agnes Lawrence, CMA Sent: 01/27/2022   3:38 PM EST To: Farrel Conners, MD Subject: Melton Alar: Refills                                     ----- Message ----- From: Viona Gilmore, [facility] Sent: 01/27/2022  12:16 PM EDT To: Rolla Flatten Subject: Refills                                        Hi,  Mr. [place] is going to try out [facility] for their adherence packaging. Can you please send refills of the following to them? -carvedilol -atorvastatin -digoxin -furosemide -omeprazole  Thanks! Maddie

## 2022-02-17 DIAGNOSIS — M9902 Segmental and somatic dysfunction of thoracic region: Secondary | ICD-10-CM | POA: Diagnosis not present

## 2022-02-17 DIAGNOSIS — M546 Pain in thoracic spine: Secondary | ICD-10-CM | POA: Diagnosis not present

## 2022-02-17 DIAGNOSIS — M9901 Segmental and somatic dysfunction of cervical region: Secondary | ICD-10-CM | POA: Diagnosis not present

## 2022-02-17 DIAGNOSIS — M62830 Muscle spasm of back: Secondary | ICD-10-CM | POA: Diagnosis not present

## 2022-02-24 ENCOUNTER — Telehealth: Payer: Medicare HMO

## 2022-02-24 DIAGNOSIS — M25562 Pain in left knee: Secondary | ICD-10-CM | POA: Diagnosis not present

## 2022-02-24 DIAGNOSIS — M1712 Unilateral primary osteoarthritis, left knee: Secondary | ICD-10-CM | POA: Diagnosis not present

## 2022-02-24 DIAGNOSIS — Z6841 Body Mass Index (BMI) 40.0 and over, adult: Secondary | ICD-10-CM | POA: Diagnosis not present

## 2022-02-25 ENCOUNTER — Ambulatory Visit (INDEPENDENT_AMBULATORY_CARE_PROVIDER_SITE_OTHER): Payer: Medicare HMO

## 2022-02-25 DIAGNOSIS — Z95810 Presence of automatic (implantable) cardiac defibrillator: Secondary | ICD-10-CM

## 2022-02-25 DIAGNOSIS — I420 Dilated cardiomyopathy: Secondary | ICD-10-CM

## 2022-02-25 LAB — CUP PACEART REMOTE DEVICE CHECK
Battery Remaining Longevity: 132 mo
Battery Voltage: 3.04 V
Brady Statistic RV Percent Paced: 0.01 %
Date Time Interrogation Session: 20231128012304
HighPow Impedance: 71 Ohm
Implantable Lead Connection Status: 753985
Implantable Lead Implant Date: 20230220
Implantable Lead Location: 753860
Implantable Lead Model: 6935
Implantable Pulse Generator Implant Date: 20230220
Lead Channel Impedance Value: 494 Ohm
Lead Channel Impedance Value: 551 Ohm
Lead Channel Pacing Threshold Amplitude: 0.375 V
Lead Channel Pacing Threshold Pulse Width: 0.4 ms
Lead Channel Sensing Intrinsic Amplitude: 3 mV
Lead Channel Sensing Intrinsic Amplitude: 3 mV
Lead Channel Setting Pacing Amplitude: 2 V
Lead Channel Setting Pacing Pulse Width: 0.4 ms
Lead Channel Setting Sensing Sensitivity: 0.3 mV
Zone Setting Status: 755011
Zone Setting Status: 755011

## 2022-02-26 NOTE — Chronic Care Management (AMB) (Signed)
Spoke with [place] at [facility], they have no record of this patient. Patients chart also shows Ozempic has been discontinued.

## 2022-02-27 DIAGNOSIS — M546 Pain in thoracic spine: Secondary | ICD-10-CM | POA: Diagnosis not present

## 2022-02-27 DIAGNOSIS — M9902 Segmental and somatic dysfunction of thoracic region: Secondary | ICD-10-CM | POA: Diagnosis not present

## 2022-02-27 DIAGNOSIS — M9901 Segmental and somatic dysfunction of cervical region: Secondary | ICD-10-CM | POA: Diagnosis not present

## 2022-02-27 DIAGNOSIS — M62830 Muscle spasm of back: Secondary | ICD-10-CM | POA: Diagnosis not present

## 2022-03-10 ENCOUNTER — Ambulatory Visit: Payer: Medicare HMO

## 2022-03-10 ENCOUNTER — Ambulatory Visit (INDEPENDENT_AMBULATORY_CARE_PROVIDER_SITE_OTHER): Payer: Medicare HMO

## 2022-03-10 VITALS — Ht 73.0 in | Wt 300.0 lb

## 2022-03-10 DIAGNOSIS — M9902 Segmental and somatic dysfunction of thoracic region: Secondary | ICD-10-CM | POA: Diagnosis not present

## 2022-03-10 DIAGNOSIS — M62830 Muscle spasm of back: Secondary | ICD-10-CM | POA: Diagnosis not present

## 2022-03-10 DIAGNOSIS — Z0000 Encounter for general adult medical examination without abnormal findings: Secondary | ICD-10-CM

## 2022-03-10 DIAGNOSIS — M546 Pain in thoracic spine: Secondary | ICD-10-CM | POA: Diagnosis not present

## 2022-03-10 DIAGNOSIS — M9901 Segmental and somatic dysfunction of cervical region: Secondary | ICD-10-CM | POA: Diagnosis not present

## 2022-03-10 NOTE — Patient Instructions (Addendum)
Matthew Barnes , Thank you for taking time to come for your Medicare Wellness Visit. I appreciate your ongoing commitment to your health goals. Please review the following plan we discussed and let me know if I can assist you in the future.   These are the goals we discussed:  Goals       Exercise 150 minutes per week (moderate activity)      Try the elliptical x 5 days a week and the goal is 30 minutes a day x 5       Lose weight (pt-stated)      I want to lose about 20lbs      Manage My Medicine      Timeframe:  Long-Range Goal Priority:  Medium Start Date:                             Expected End Date:                       Follow Up Date 04/07/21    - keep a list of all the medicines I take; vitamins and herbals too - use a pillbox to sort medicine - use an alarm clock or phone to remind me to take my medicine    Why is this important?   These steps will help you keep on track with your medicines.   Notes:       patient      Lose weight      Patient Stated      Try to get more exercise  Motivation is to get PUMPED      Patient Stated      02/25/2021, wants to lose 50 pounds      Track and Manage My Blood Pressure-Hypertension      Timeframe:  Long-Range Goal Priority:  Medium Start Date:                             Expected End Date:                       Follow Up Date 04/07/21    - check blood pressure weekly - choose a place to take my blood pressure (home, clinic or office, retail store) - write blood pressure results in a log or diary    Why is this important?   You won't feel high blood pressure, but it can still hurt your blood vessels.  High blood pressure can cause heart or kidney problems. It can also cause a stroke.  Making lifestyle changes like losing a little weight or eating less salt will help.  Checking your blood pressure at home and at different times of the day can help to control blood pressure.  If the doctor prescribes medicine remember to  take it the way the doctor ordered.  Call the office if you cannot afford the medicine or if there are questions about it.     Notes:       Weight (lb) < 250 lb (113.4 kg)      Check out  online nutrition programs as GumSearch.nl and http://vang.com/;   There is a lot of other apps you may enjoy; "lose it".  You can track you food with calorieking.com  Check your sodium levels and calories per day with these apps until you find a combination that works  for you   Look for foods with "whole" wheat; bran; oatmeal etc Shot at the farmer's markets in season for fresher choices  Watch for "hydrogenated" on the label of oils which are trans-fats.  Watch for "high fructose corn syrup" in snacks, yogurt or ketchup  Meats have less marbling; bright colored fruits and vegetables;  Canned; dump out liquid and wash vegetables. Be mindful of what we are eating  Portion control is essential to a health weight! Sit down; take a break and enjoy your meal; take smaller bites; put the fork down between bites;  It takes 20 minutes to get full; so check in with your fullness cues and stop eating when you start to fill full               This is a list of the screening recommended for you and due dates:  Health Maintenance  Topic Date Due   Yearly kidney health urinalysis for diabetes  03/11/2022*   COVID-19 Vaccine (4 - 2023-24 season) 03/26/2022*   Cologuard (Stool DNA test)  03/11/2023*   Hemoglobin A1C  04/30/2022   Eye exam for diabetics  08/30/2022   Complete foot exam   11/02/2022   Yearly kidney function blood test for diabetes  01/31/2023   Medicare Annual Wellness Visit  03/11/2023   DTaP/Tdap/Td vaccine (2 - Td or Tdap) 10/24/2031   Pneumonia Vaccine  Completed   Hepatitis C Screening: USPSTF Recommendation to screen - Ages 66-79 yo.  Completed   Zoster (Shingles) Vaccine  Completed   HPV Vaccine  Aged Out   Flu Shot  Discontinued  *Topic was postponed. The date shown is  not the original due date.    Advanced directives: Advance directive discussed with you today. Even though you declined this today, please call our office should you change your mind, and we can give you the proper paperwork for you to fill out.   Conditions/risks identified: None  Next appointment: Follow up in one year for your annual wellness visit.    Preventive Care 20 Years and Older, Male  Preventive care refers to lifestyle choices and visits with your health care provider that can promote health and wellness. What does preventive care include? A yearly physical exam. This is also called an annual well check. Dental exams once or twice a year. Routine eye exams. Ask your health care provider how often you should have your eyes checked. Personal lifestyle choices, including: Daily care of your teeth and gums. Regular physical activity. Eating a healthy diet. Avoiding tobacco and drug use. Limiting alcohol use. Practicing safe sex. Taking low doses of aspirin every day. Taking vitamin and mineral supplements as recommended by your health care provider. What happens during an annual well check? The services and screenings done by your health care provider during your annual well check will depend on your age, overall health, lifestyle risk factors, and family history of disease. Counseling  Your health care provider may ask you questions about your: Alcohol use. Tobacco use. Drug use. Emotional well-being. Home and relationship well-being. Sexual activity. Eating habits. History of falls. Memory and ability to understand (cognition). Work and work Statistician. Screening  You may have the following tests or measurements: Height, weight, and BMI. Blood pressure. Lipid and cholesterol levels. These may be checked every 5 years, or more frequently if you are over 37 years old. Skin check. Lung cancer screening. You may have this screening every year starting at age 73  if you have  a 30-pack-year history of smoking and currently smoke or have quit within the past 15 years. Fecal occult blood test (FOBT) of the stool. You may have this test every year starting at age 77. Flexible sigmoidoscopy or colonoscopy. You may have a sigmoidoscopy every 5 years or a colonoscopy every 10 years starting at age 44. Prostate cancer screening. Recommendations will vary depending on your family history and other risks. Hepatitis C blood test. Hepatitis B blood test. Sexually transmitted disease (STD) testing. Diabetes screening. This is done by checking your blood sugar (glucose) after you have not eaten for a while (fasting). You may have this done every 1-3 years. Abdominal aortic aneurysm (AAA) screening. You may need this if you are a current or former smoker. Osteoporosis. You may be screened starting at age 2 if you are at high risk. Talk with your health care provider about your test results, treatment options, and if necessary, the need for more tests. Vaccines  Your health care provider may recommend certain vaccines, such as: Influenza vaccine. This is recommended every year. Tetanus, diphtheria, and acellular pertussis (Tdap, Td) vaccine. You may need a Td booster every 10 years. Zoster vaccine. You may need this after age 25. Pneumococcal 13-valent conjugate (PCV13) vaccine. One dose is recommended after age 70. Pneumococcal polysaccharide (PPSV23) vaccine. One dose is recommended after age 5. Talk to your health care provider about which screenings and vaccines you need and how often you need them. This information is not intended to replace advice given to you by your health care provider. Make sure you discuss any questions you have with your health care provider. Document Released: 04/13/2015 Document Revised: 12/05/2015 Document Reviewed: 01/16/2015 Elsevier Interactive Patient Education  2017 [place] Prevention in the Home Falls can cause  injuries. They can happen to people of all ages. There are many things you can do to make your home safe and to help prevent falls. What can I do on the outside of my home? Regularly fix the edges of walkways and driveways and fix any cracks. Remove anything that might make you trip as you walk through a door, such as a raised step or threshold. Trim any bushes or trees on the path to your home. Use bright outdoor lighting. Clear any walking paths of anything that might make someone trip, such as rocks or tools. Regularly check to see if handrails are loose or broken. Make sure that both sides of any steps have handrails. Any raised decks and porches should have guardrails on the edges. Have any leaves, snow, or ice cleared regularly. Use sand or salt on walking paths during winter. Clean up any spills in your garage right away. This includes oil or grease spills. What can I do in the bathroom? Use night lights. Install grab bars by the toilet and in the tub and shower. Do not use towel bars as grab bars. Use non-skid mats or decals in the tub or shower. If you need to sit down in the shower, use a plastic, non-slip stool. Keep the floor dry. Clean up any water that spills on the floor as soon as it happens. Remove soap buildup in the tub or shower regularly. Attach bath mats securely with double-sided non-slip rug tape. Do not have throw rugs and other things on the floor that can make you trip. What can I do in the bedroom? Use night lights. Make sure that you have a light by your bed that is easy to reach.  Do not use any sheets or blankets that are too big for your bed. They should not hang down onto the floor. Have a firm chair that has side arms. You can use this for support while you get dressed. Do not have throw rugs and other things on the floor that can make you trip. What can I do in the kitchen? Clean up any spills right away. Avoid walking on wet floors. Keep items that you  use a lot in easy-to-reach places. If you need to reach something above you, use a strong step stool that has a grab bar. Keep electrical cords out of the way. Do not use floor polish or wax that makes floors slippery. If you must use wax, use non-skid floor wax. Do not have throw rugs and other things on the floor that can make you trip. What can I do with my stairs? Do not leave any items on the stairs. Make sure that there are handrails on both sides of the stairs and use them. Fix handrails that are broken or loose. Make sure that handrails are as long as the stairways. Check any carpeting to make sure that it is firmly attached to the stairs. Fix any carpet that is loose or worn. Avoid having throw rugs at the top or bottom of the stairs. If you do have throw rugs, attach them to the floor with carpet tape. Make sure that you have a light switch at the top of the stairs and the bottom of the stairs. If you do not have them, ask someone to add them for you. What else can I do to help prevent falls? Wear shoes that: Do not have high heels. Have rubber bottoms. Are comfortable and fit you well. Are closed at the toe. Do not wear sandals. If you use a stepladder: Make sure that it is fully opened. Do not climb a closed stepladder. Make sure that both sides of the stepladder are locked into place. Ask someone to hold it for you, if possible. Clearly mark and make sure that you can see: Any grab bars or handrails. First and last steps. Where the edge of each step is. Use tools that help you move around (mobility aids) if they are needed. These include: Canes. Walkers. Scooters. Crutches. Turn on the lights when you go into a dark area. Replace any light bulbs as soon as they burn out. Set up your furniture so you have a clear path. Avoid moving your furniture around. If any of your floors are uneven, fix them. If there are any pets around you, be aware of where they are. Review your  medicines with your doctor. Some medicines can make you feel dizzy. This can increase your chance of falling. Ask your doctor what other things that you can do to help prevent falls. This information is not intended to replace advice given to you by your health care provider. Make sure you discuss any questions you have with your health care provider. Document Released: 01/11/2009 Document Revised: 08/23/2015 Document Reviewed: 04/21/2014 Elsevier Interactive Patient Education  2017 [employer].

## 2022-03-10 NOTE — Progress Notes (Signed)
Subjective:   Matthew Barnes is a 66 y.o. male who presents for Medicare Annual/Subsequent preventive examination.  Review of Systems    Virtual Visit via Telephone Note  I connected with  Matthew Barnes on 03/10/22 at 12:30 PM EST by telephone and verified that I am speaking with the correct person using two identifiers.  Location: Patient: Home Provider: Office Persons participating in the virtual visit: patient/Nurse Health Advisor   I discussed the limitations, risks, security and privacy concerns of performing an evaluation and management service by telephone and the availability of in person appointments. The patient expressed understanding and agreed to proceed.  Interactive audio and video telecommunications were attempted between this nurse and patient, however failed, due to patient having technical difficulties OR patient did not have access to video capability.  We continued and completed visit with audio only.  Some vital signs may be absent or patient reported.   Criselda Peaches, LPN  Cardiac Risk Factors include: advanced age (>37mn, >>32women);diabetes mellitus;male gender;hypertension;obesity (BMI >30kg/m2)     Objective:    Today's Vitals   03/10/22 1236  Weight: 300 lb (136.1 kg)  Height: _0  (1.854 m)   Body mass index is 39.58 kg/m.     03/10/2022   12:44 PM 05/20/2021   10:00 AM 02/25/2021   11:20 AM 10/02/2020    4:40 PM 10/01/2020    8:25 PM 07/30/2020    8:29 PM 02/15/2020   11:10 AM  Advanced Directives  Does Patient Have a Medical Advance Directive? _1  No No  Would patient like information on creating a medical advance directive? No - Patient declined Yes (MAU/Ambulatory/Procedural Areas - Information given)  No - Patient declined  No - Patient declined No - Patient declined    Current Medications (verified) Outpatient Encounter Medications as of 03/10/2022  Medication Sig   ACCU-CHEK GUIDE test strip USE TO CHECK BLOOD SUGAR 2  TIMES DAILY   Accu-Chek Softclix Lancets lancets Check blood sugar 2 times daily   atorvastatin (LIPITOR) 20 MG tablet Take 1 tablet (20 mg total) by mouth daily.   Blood Glucose Monitoring Suppl (ACCU-CHEK GUIDE) w/Device KIT Check blood sugar 2 times daily   carvedilol (COREG) 6.25 MG tablet Take 1 tablet (6.25 mg total) by mouth 2 (two) times daily with a meal.   digoxin (LANOXIN) 0.125 MG tablet Take 1 tablet (125 mcg total) by mouth daily.   furosemide (LASIX) 20 MG tablet Take 1 tablet (20 mg total) by mouth daily.   glucose blood (ONETOUCH VERIO) test strip USE AS INSTRUCTED TO CHECK BLOOD SUGAR 2 TIMES A DAY.   insulin NPH-regular Human (NOVOLIN 70/30) (70-30) 100 UNIT/ML injection Inject 40 Units into the skin 2 (two) times daily with a meal.   Insulin Syringe-Needle U-100 (INSULIN SYRINGE 1CC/31GX5/16") 31G X 5/16" 1 ML MISC Use 3 needles per day   lithium carbonate 300 MG capsule Take 3 capsules (900 mg total) by mouth daily.   losartan (COZAAR) 50 MG tablet Take 1 tablet (50 mg total) by mouth daily.   omeprazole (PRILOSEC) 20 MG capsule Take 1 capsule (20 mg total) by mouth daily.   QUEtiapine (SEROQUEL) 200 MG tablet Take 600 mg by mouth at bedtime. Per patient taking 500 mg   sildenafil (VIAGRA) 50 MG tablet Take 1 tablet (50 mg total) by mouth daily as needed for erectile dysfunction. Do not take more then once in 24 hours.   tadalafil (CIALIS) 20  MG tablet Take 0.5-1 tablets (10-20 mg total) by mouth every other day as needed for erectile dysfunction.   No facility-administered encounter medications on file as of 03/10/2022.    Allergies (verified) Influenza vaccines   History: Past Medical History:  Diagnosis Date   Bipolar II disorder - Managed by Marye Round [phone]) 05/03/2013   Diabetes mellitus    Dysphagia    Hyperlipidemia    Hypertension    Unspecified hypothyroidism 05/03/2013   Past Surgical History:  Procedure Laterality Date   ICD IMPLANT N/A  05/20/2021   Procedure: ICD IMPLANT;  Surgeon: Evans Lance, MD;  Location: [facility];  Service: Cardiovascular;  Laterality: N/A;   KNEE SURGERY     scope on left knee   RIGHT/LEFT HEART CATH AND CORONARY ANGIOGRAPHY N/A 08/07/2020   Procedure: RIGHT/LEFT HEART CATH AND CORONARY ANGIOGRAPHY;  Surgeon: Leonie Man, MD;  Location: [facility];  Service: Cardiovascular;  Laterality: N/A;   Family History  Problem Relation Age of Onset   Diabetes Mother    Hypertension Mother    Heart disease Mother    Heart disease Father    Heart attack Father 3   Diabetes Father    Hypertension Father    Depression Father    Diabetes Sister    Depression Paternal Grandmother    Depression Other    Social History   Socioeconomic History   Marital status: Single    Spouse name: Not on file   Number of children: Not on file   Years of education: Not on file   Highest education level: Not on file  Occupational History   Not on file  Tobacco Use   Smoking status: Never   Smokeless tobacco: Never  Vaping Use   Vaping Use: Never used  Substance and Sexual Activity   Alcohol use: No   Drug use: No   Sexual activity: Not Currently  Other Topics Concern   Not on file  Social History Narrative   Work or School: on disability for knee arthritis      Home Situation: lives alone      Spiritual Beliefs:Christian      Lifestyle:no regular exercising; diet not great            Social Determinants of Health   Financial Resource Strain: Low Risk  (03/10/2022)   Overall Financial Resource Strain (CARDIA)    Difficulty of Paying Living Expenses: Not hard at all  Recent Concern: Financial Resource Strain - Medium Risk (01/24/2022)   Overall Financial Resource Strain (CARDIA)    Difficulty of Paying Living Expenses: Somewhat hard  Food Insecurity: No Food Insecurity (03/10/2022)   Hunger Vital Sign    Worried About Running Out of Food in the Last Year: Never true     Ran Out of Food in the Last Year: Never true  Transportation Needs: No Transportation Needs (03/10/2022)   PRAPARE - Hydrologist (Medical): No    Lack of Transportation (Non-Medical): No  Physical Activity: Insufficiently Active (03/10/2022)   Exercise Vital Sign    Days of Exercise per Week: 4 days    Minutes of Exercise per Session: 30 min  Stress: No Stress Concern Present (03/10/2022)   [place]    Feeling of Stress : Not at all  Social Connections: Socially Isolated (03/10/2022)   Social Connection and Isolation Panel [NHANES]    Frequency of Communication  with Friends and Family: More than three times a week    Frequency of Social Gatherings with Friends and Family: More than three times a week    Attends Religious Services: Never    Marine scientist or Organizations: No    Attends Music therapist: Never    Marital Status: Never married    Tobacco Counseling Counseling given: Not Answered   Clinical Intake:  Pre-visit preparation completed: No  Pain : No/denies pain Nutrition Risk Assessment:  Has the patient had any N/V/D within the last 2 months?  No  Does the patient have any non-healing wounds?  No  Has the patient had any unintentional weight loss or weight gain?  No   Diabetes:  Is the patient diabetic?  Yes  If diabetic, was a CBG obtained today?  Yes CBG 230 Taken by patient Did the patient bring in their glucometer from home?  No  How often do you monitor your CBG's? 3 x Daily.   Financial Strains and Diabetes Management:  Are you having any financial strains with the device, your supplies or your medication? No .  Does the patient want to be seen by Chronic Care Management for management of their diabetes?  No  Would the patient like to be referred to a Nutritionist or for Diabetic Management?  No   Diabetic Exams:  Diabetic Eye Exam:  Completed No. Overdue for diabetic eye exam. Pt has been advised about the importance in completing this exam. A referral has been placed today. Message sent to referral coordinator for scheduling purposes. Advised pt to expect a call from office referred to regarding appt.  Diabetic Foot Exam: Completed No. Pt has been advised about the importance in completing this exam. Pt is scheduled for diabetic foot exam on Followed by Dr Dwyane Dee.      BMI - recorded: 39.58 Nutritional Status: BMI > 30  Obese Nutritional Risks: None Diabetes: Yes CBG done?: Yes CBG resulted in Enter/ Edit results?: Yes (CBG 230 Taken by patient) Did pt. bring in CBG monitor from home?: No  How often do you need to have someone help you when you read instructions, pamphlets, or other written materials from your doctor or pharmacy?: 1 - Never  Diabetic? Yes  Interpreter Needed?: No  Information entered by :: Rolene Arbour LPN   Activities of Daily Living    03/10/2022   12:42 PM  In your present state of health, do you have any difficulty performing the following activities:  Hearing? 0  Vision? 0  Difficulty concentrating or making decisions? 0  Walking or climbing stairs? 0  Dressing or bathing? 0  Doing errands, shopping? 0  Preparing Food and eating ? N  Using the Toilet? N  In the past six months, have you accidently leaked urine? N  Do you have problems with loss of bowel control? N  Managing your Medications? N  Managing your Finances? N  Housekeeping or managing your Housekeeping? N    Patient Care Team: Farrel Conners, MD as PCP - General (Family Medicine) Lorretta Harp, MD as PCP - Cardiology (Cardiology) Evans Lance, MD as PCP - Electrophysiology (Cardiology) Adegoroye, Wynona Luna, MD (Specialist) Elayne Snare, MD as Consulting Physician (Endocrinology) Viona Gilmore, [facility] as Pharmacist (Pharmacist)  Indicate any recent Medical Services you may have received from other  than [facility] providers in the past year (date may be approximate).     Assessment:   This is a routine  wellness examination for Brion.  Hearing/Vision screen Hearing Screening - Comments:: Denies hearing difficulties   Vision Screening - Comments:: Wears rx glasses - up to date with routine eye exams with  Dr Ronnald Ramp  Dietary issues and exercise activities discussed: Current Exercise Habits: Home exercise routine, Type of exercise: walking, Time (Minutes): 30, Frequency (Times/Week): 4, Weekly Exercise (Minutes/Week): 120, Intensity: Moderate, Exercise limited by: None identified   Goals Addressed               This Visit's Progress     Lose weight (pt-stated)        I want to lose about 20lbs       Depression Screen    03/10/2022   12:41 PM 12/23/2021   10:24 AM 02/25/2021   11:21 AM 10/05/2020    2:30 PM 02/15/2020   11:09 AM 08/12/2019    2:29 PM 07/11/2019   10:40 AM  PHQ 2/9 Scores  PHQ - 2 Score 0 3 0 0 0 3 2  PHQ- 9 Score  5  0  6 5    Fall Risk    03/10/2022   12:42 PM 12/23/2021   10:24 AM 02/25/2021   11:20 AM 02/15/2020   11:11 AM 08/12/2019    2:29 PM  Fall Risk   Falls in the past year? 0 0 0 0 1  Number falls in past yr: 0 0  0 1  Injury with Fall? 0 0  0 0  Risk for fall due to : No Fall Risks No Fall Risks Medication side effect Impaired vision   Follow up Falls prevention discussed Falls evaluation completed Falls evaluation completed;Education provided;Falls prevention discussed Falls prevention discussed     FALL RISK PREVENTION PERTAINING TO THE HOME:  Any stairs in or around the home? No  If so, are there any without handrails? No  Home free of loose throw rugs in walkways, pet beds, electrical cords, etc? Yes  Adequate lighting in your home to reduce risk of falls? Yes   ASSISTIVE DEVICES UTILIZED TO PREVENT FALLS:  Life alert? No  Use of a cane, walker or w/c? No  Grab bars in the bathroom? No  Shower chair or bench in shower? No   Elevated toilet seat or a handicapped toilet? No   TIMED UP AND GO:  Was the test performed? No . Audio Visit   Cognitive Function:      06/04/2020   12:25 PM  Montreal Cognitive Assessment   Visuospatial/ Executive (0/5) 0  Naming (0/3) 2  Attention: Read list of digits (0/2) 2  Attention: Read list of letters (0/1) 0  Attention: Serial 7 subtraction starting at 100 (0/3) 2  Language: Repeat phrase (0/2) 2  Language : Fluency (0/1) 1  Abstraction (0/2) 1  Delayed Recall (0/5) 2  Orientation (0/6) 5  Total 17  Adjusted Score (based on education) 18      03/10/2022   12:44 PM 02/25/2021   11:22 AM  6CIT Screen  What Year? 0 points 0 points  What month? 0 points 0 points  What time? 0 points 0 points  Count back from 20 0 points 0 points  Months in reverse 0 points 2 points  Repeat phrase 0 points 0 points  Total Score 0 points 2 points    Immunizations Immunization History  Administered Date(s) Administered   Influenza,inj,Quad PF,6+ Mos 01/17/2014, 03/21/2020   Moderna Sars-Covid-2 Vaccination 08/30/2019, 10/04/2019, 02/29/2020   PNEUMOCOCCAL CONJUGATE-20 01/22/2022   Pneumococcal  Polysaccharide-23 01/17/2014   Tdap 10/23/2021   Zoster Recombinat (Shingrix) 06/25/2021, 09/25/2021    TDAP status: Up to date    Pneumococcal vaccine status: Up to date  Covid-19 vaccine status: Completed vaccines  Qualifies for Shingles Vaccine? Yes   Zostavax completed Yes   Shingrix Completed?: Yes  Screening Tests Health Maintenance  Topic Date Due   Diabetic kidney evaluation - Urine ACR  03/11/2022 (Originally 02/28/2022)   COVID-19 Vaccine (4 - 2023-24 season) 03/26/2022 (Originally 11/29/2021)   Fecal DNA (Cologuard)  03/11/2023 (Originally 11/07/2019)   HEMOGLOBIN A1C  04/30/2022   OPHTHALMOLOGY EXAM  08/30/2022   FOOT EXAM  11/02/2022   Diabetic kidney evaluation - eGFR measurement  01/31/2023   Medicare Annual Wellness (AWV)  03/11/2023   DTaP/Tdap/Td (2 - Td  or Tdap) 10/24/2031   Pneumonia Vaccine 56+ Years old  Completed   Hepatitis C Screening  Completed   Zoster Vaccines- Shingrix  Completed   HPV VACCINES  Aged Out   INFLUENZA VACCINE  Discontinued    Health Maintenance  There are no preventive care reminders to display for this patient.   Colorectal cancer screening: Referral to GI placed Patient deferred. Pt aware the office will call re: appt.  Lung Cancer Screening: (Low Dose CT Chest recommended if Age 32-80 years, 30 pack-year currently smoking OR have quit w/in 15years.) does not qualify.     Additional Screening:  Hepatitis C Screening: does qualify; Completed 08/28/16  Vision Screening: Recommended annual ophthalmology exams for early detection of glaucoma and other disorders of the eye. Is the patient up to date with their annual eye exam?  Yes  Who is the provider or what is the name of the office in which the patient attends annual eye exams? Dr Ronnald Ramp If pt is not established with a provider, would they like to be referred to a provider to establish care? No .   Dental Screening: Recommended annual dental exams for proper oral hygiene  Community Resource Referral / Chronic Care Management:  CRR required this visit?  No   CCM required this visit?  No      Plan:     I have personally reviewed and noted the following in the patient's chart:   Medical and social history Use of alcohol, tobacco or illicit drugs  Current medications and supplements including opioid prescriptions. Patient is not currently taking opioid prescriptions. Functional ability and status Nutritional status Physical activity Advanced directives List of other physicians Hospitalizations, surgeries, and ER visits in previous 12 months Vitals Screenings to include cognitive, depression, and falls Referrals and appointments  In addition, I have reviewed and discussed with patient certain preventive protocols, quality metrics, and best  practice recommendations. A written personalized care plan for preventive services as well as general preventive health recommendations were provided to patient.     Criselda Peaches, LPN   17/[id number]   Nurse Notes: Patient due Diabetic kidney evaluaiton-Urine ACR

## 2022-03-11 ENCOUNTER — Telehealth: Payer: Self-pay | Admitting: Pharmacist

## 2022-03-11 NOTE — Chronic Care Management (AMB) (Signed)
Chronic Care Management Pharmacy Assistant   Name: Matthew Barnes  MRN: [id number] DOB: [birth year]  Reason for Encounter: Disease State / Hypertension Assessment Call   Conditions to be addressed/monitored: HTN  Recent office visits:  03/10/2022 Rolene Arbour LPN - Medicare annual wellness exam  Recent consult visits:  02/24/2022 Paralee Cancel MD [facility]) - Patient was seen for Obesity and additional concerns. No additional chart notes.   02/04/2022 Elayne Snare MD(endocrinology) - Patient was seen for Uncontrolled type 2 diabetes mellitus with hyperglycemia, with long-term current use of insulin and additional concerns. No medication changes. No follow up noted.   01/28/2022 Ermalene Searing (chiropractor)  -Patient was seen for Segmental and somatic dysfunction of cervical region and additional concerns. No additional chart notes.   Hospital visits:  None  Medications: Outpatient Encounter Medications as of 03/11/2022  Medication Sig   ACCU-CHEK GUIDE test strip USE TO CHECK BLOOD SUGAR 2 TIMES DAILY   Accu-Chek Softclix Lancets lancets Check blood sugar 2 times daily   atorvastatin (LIPITOR) 20 MG tablet Take 1 tablet (20 mg total) by mouth daily.   Blood Glucose Monitoring Suppl (ACCU-CHEK GUIDE) w/Device KIT Check blood sugar 2 times daily   carvedilol (COREG) 6.25 MG tablet Take 1 tablet (6.25 mg total) by mouth 2 (two) times daily with a meal.   digoxin (LANOXIN) 0.125 MG tablet Take 1 tablet (125 mcg total) by mouth daily.   furosemide (LASIX) 20 MG tablet Take 1 tablet (20 mg total) by mouth daily.   glucose blood (ONETOUCH VERIO) test strip USE AS INSTRUCTED TO CHECK BLOOD SUGAR 2 TIMES A DAY.   insulin NPH-regular Human (NOVOLIN 70/30) (70-30) 100 UNIT/ML injection Inject 40 Units into the skin 2 (two) times daily with a meal.   Insulin Syringe-Needle U-100 (INSULIN SYRINGE 1CC/31GX5/16") 31G X 5/16" 1 ML MISC Use 3 needles per day   lithium carbonate 300 MG capsule  Take 3 capsules (900 mg total) by mouth daily.   losartan (COZAAR) 50 MG tablet Take 1 tablet (50 mg total) by mouth daily.   omeprazole (PRILOSEC) 20 MG capsule Take 1 capsule (20 mg total) by mouth daily.   QUEtiapine (SEROQUEL) 200 MG tablet Take 600 mg by mouth at bedtime. Per patient taking 500 mg   sildenafil (VIAGRA) 50 MG tablet Take 1 tablet (50 mg total) by mouth daily as needed for erectile dysfunction. Do not take more then once in 24 hours.   tadalafil (CIALIS) 20 MG tablet Take 0.5-1 tablets (10-20 mg total) by mouth every other day as needed for erectile dysfunction.   No facility-administered encounter medications on file as of 03/11/2022.  Fill History:   Dispensed Days Supply Quantity Provider Pharmacy  atorvastatin 20 mg tablet 01/29/2022 62 62 each      Dispensed Days Supply Quantity Provider Pharmacy  carvedilol 6.25 mg tablet 01/29/2022 76 152 each      Dispensed Days Supply Quantity Provider Pharmacy  DIGOXIN 125 MCG TABLET 08/01/2021 90 90 each      Dispensed Days Supply Quantity Provider Pharmacy  furosemide 20 mg tablet 01/29/2022 66 66 each      Dispensed Days Supply Quantity Provider Pharmacy  Novolin 70/30 U-100 Insulin 100 unit/mL subcutaneous suspension 01/29/2022 25 20 mL      Dispensed Days Supply Quantity Provider Pharmacy  lithium carbonate 300 mg capsule 01/29/2022 72 216 each      Dispensed Days Supply Quantity Provider Pharmacy  OMEPRAZOLE DR 20 MG CAPSULE 01/06/2022 90 90 each  Dispensed Days Supply Quantity Provider Pharmacy  QUETIAPINE FUMARATE 200 MG TAB 01/16/2022 90 270 each     Reviewed chart prior to disease state call. Spoke with patient regarding BP  Recent Office Vitals: BP Readings from Last 3 Encounters:  02/04/22 (!) 140/70  01/24/22 132/70  01/22/22 100/68   Pulse Readings from Last 3 Encounters:  02/04/22 73  01/22/22 73  12/23/21 91    Wt Readings from Last 3 Encounters:  03/10/22 300 lb (136.1 kg)  02/04/22  (!) 300 lb 9.6 oz (136.4 kg)  01/22/22 (!) 301 lb 4.8 oz (136.7 kg)     Kidney Function Lab Results  Component Value Date/Time   CREATININE 1.28 01/30/2022 12:18 PM   CREATININE 1.34 10/28/2021 11:44 AM   GFR 58.57 (L) 01/30/2022 12:18 PM   GFRNONAA >60 07/07/2021 04:03 PM   GFRAA >90 04/18/2011 08:00 PM       Latest Ref Rng & Units 01/30/2022   12:18 PM 10/28/2021   11:44 AM 08/30/2021    1:01 PM  BMP  Glucose 70 - 99 mg/dL 276  148  269   BUN 6 - 23 mg/dL _0 Creatinine 0.40 - 1.50 mg/dL 1.28  1.34  1.32   BUN/Creat Ratio 10 - 24   8   Sodium 135 - 145 mEq/L 140  142  143   Potassium 3.5 - 5.1 mEq/L 4.2  3.8  3.9   Chloride 96 - 112 mEq/L 104  105  102   CO2 19 - 32 mEq/L _1 Calcium 8.4 - 10.5 mg/dL 9.4  9.2  9.1     Current antihypertensive regimen:  Carvedilol 6.25 mg twice daily Losartan 50 mg daily  How often are you checking your Blood Pressure? Patient states he has not purchased an blood pressure cuff yet. He plans to purchase one. He was advised to keep a log of is readings when he starts checking his blood pressures.   Current home BP readings: Patient is not checking blood pressures at home.  What recent interventions/DTPs have been made by any provider to improve Blood Pressure control since last CPP Visit: No recent interventions.   Any recent hospitalizations or ED visits since last visit with CPP? No recent hospital visits.   What diet changes have been made to improve Blood Pressure Control?  Patient follows no specific diet Breakfast - patient will have two sausage biscuit Lunch - patient will have a [place] and cheese sandwich  Dinner - patient will have variety with some sort of meat and whatever he feels like   What exercise is being done to improve your Blood Pressure Control?  Patient is working out 20-30 minutes daily   Adherence Review: Is the patient currently on ACE/ARB medication? Yes Does the patient have >5 day gap  between last estimated fill dates? No  Care Gaps: AWV - scheduled 03/13/2023 Last BP - 140/70 on 02/04/2022 Last A1C - 8.3 on 10/28/2021  Star Rating Drugs: Atorvastatin 20 mg - last filled 01/29/2022 62 DS at [facility] Losartan 50 mg - last filled 01/29/2022 70 DS at [facility] [phone]

## 2022-03-26 NOTE — Chronic Care Management (AMB) (Signed)
Spoke with Elmarie Mainland, Dr. Ronnie Derby nurse, she states she did receive the pap application for Ozempic.  She states Dr. Dwyane Dee did not start him on Ozempic yet, just increased his insulin and for her to hold the pap application due to possible changes.

## 2022-03-27 NOTE — Progress Notes (Signed)
Remote ICD transmission.   

## 2022-04-03 ENCOUNTER — Telehealth: Payer: Self-pay | Admitting: Pharmacist

## 2022-04-03 NOTE — Progress Notes (Signed)
Care Management & Coordination Services Pharmacy Team  Name: Matthew Barnes  MRN: [id number] DOB: 05-22-1955  Reason for Encounter: Medication coordination and delivery  Contacted patient on 04/03/2022 to discuss medications   Recent office visits:  None  Recent consult visits:  None  Hospital visits:  None  Medications: Outpatient Encounter Medications as of 04/03/2022  Medication Sig   ACCU-CHEK GUIDE test strip USE TO CHECK BLOOD SUGAR 2 TIMES DAILY   Accu-Chek Softclix Lancets lancets Check blood sugar 2 times daily   atorvastatin (LIPITOR) 20 MG tablet Take 1 tablet (20 mg total) by mouth daily.   Blood Glucose Monitoring Suppl (ACCU-CHEK GUIDE) w/Device KIT Check blood sugar 2 times daily   carvedilol (COREG) 6.25 MG tablet Take 1 tablet (6.25 mg total) by mouth 2 (two) times daily with a meal.   digoxin (LANOXIN) 0.125 MG tablet Take 1 tablet (125 mcg total) by mouth daily.   furosemide (LASIX) 20 MG tablet Take 1 tablet (20 mg total) by mouth daily.   glucose blood (ONETOUCH VERIO) test strip USE AS INSTRUCTED TO CHECK BLOOD SUGAR 2 TIMES A DAY.   insulin NPH-regular Human (NOVOLIN 70/30) (70-30) 100 UNIT/ML injection Inject 40 Units into the skin 2 (two) times daily with a meal.   Insulin Syringe-Needle U-100 (INSULIN SYRINGE 1CC/31GX5/16") 31G X 5/16" 1 ML MISC Use 3 needles per day   lithium carbonate 300 MG capsule Take 3 capsules (900 mg total) by mouth daily.   losartan (COZAAR) 50 MG tablet Take 1 tablet (50 mg total) by mouth daily.   omeprazole (PRILOSEC) 20 MG capsule Take 1 capsule (20 mg total) by mouth daily.   QUEtiapine (SEROQUEL) 200 MG tablet Take 600 mg by mouth at bedtime. Per patient taking 500 mg   sildenafil (VIAGRA) 50 MG tablet Take 1 tablet (50 mg total) by mouth daily as needed for erectile dysfunction. Do not take more then once in 24 hours.   tadalafil (CIALIS) 20 MG tablet Take 0.5-1 tablets (10-20 mg total) by mouth every other day as needed for  erectile dysfunction.   No facility-administered encounter medications on file as of 04/03/2022.   BP Readings from Last 3 Encounters:  02/04/22 (!) 140/70  01/24/22 132/70  01/22/22 100/68    Pulse Readings from Last 3 Encounters:  02/04/22 73  01/22/22 73  12/23/21 91    Lab Results  Component Value Date/Time   HGBA1C 8.3 (H) 10/28/2021 11:44 AM   HGBA1C 6.9 (A) 08/21/2021 11:05 AM   HGBA1C 7.5 (H) 04/09/2021 10:55 AM   Lab Results  Component Value Date   CREATININE 1.28 01/30/2022   BUN 14 01/30/2022   GFR 58.57 (L) 01/30/2022   GFRNONAA >60 07/07/2021   GFRAA >90 04/18/2011   NA 140 01/30/2022   K 4.2 01/30/2022   CALCIUM 9.4 01/30/2022   CO2 26 01/30/2022   Last adherence delivery date:01/29/2022 pt was a new onboard      Patient is due for next adherence delivery on: 04/15/2022  Spoke with patient on 04/03/2022 reviewed medications and coordinated delivery.  This delivery to include: Adherence Packaging  30 Days  Atorvastatin 20 mg  - 1 tablet at bedtime Carvedilol 6.25 mg - 1 tablet at breakfast and bedtime Digoxin 125 mcg - 1 tablet at bedtime Furosemide 20 mg - 1 tablet at breakfast Lithium 300 mg - 3 tablets at bedtime Losartan 50 mg - 1 tablet at bedtime Omeprazole 20 mg - 1 tablet at breakfast Quetiapine 200 mg -  3 tablets at bedtime Novolin 70/30 - inject 40 units twice daily  Patient declined the following medications this month: No medications declined  Confirmed delivery date of 04/15/2022, advised patient that pharmacy will contact them the morning of delivery.  Any concerns about your medications? Patient denies and concerns  How often do you forget or accidentally miss a dose? Never  Do you use a pillbox? No, patient is set up for packaging  Is patient in packaging Yes, patient was a new onboard on 01/29/2022   Cycle dispensing form sent to [facility] for review.  Care Gaps: AWV - scheduled 03/13/2023 Last BP - 140/70 on  02/04/2022 Last A1C - 8.3 on 10/28/2021 Covid - overdue Urine ACR - overdue Fecal DNA - postponed  Star Rating Drugs: Atorvastatin 20 mg - last filled 01/29/2022 62 DS at [facility] Losartan 50 mg - last filled 01/29/2022 70 DS at [facility] [phone]

## 2022-04-07 ENCOUNTER — Telehealth: Payer: Self-pay | Admitting: *Deleted

## 2022-04-07 NOTE — Patient Outreach (Signed)
  Care Coordination   04/07/2022 Name: Matthew Barnes MRN: [id number] DOB: 1955/07/20   Care Coordination Outreach Attempts:  An unsuccessful telephone outreach was attempted today to offer the patient information about available care coordination services as a benefit of their health plan.   Follow Up Plan:  Additional outreach attempts will be made to offer the patient care coordination information and services.   Encounter Outcome:  No Answer   Care Coordination Interventions:  No, not indicated    Raina Mina, RN Care Management Coordinator [place] Office [phone]

## 2022-04-09 DIAGNOSIS — M9902 Segmental and somatic dysfunction of thoracic region: Secondary | ICD-10-CM | POA: Diagnosis not present

## 2022-04-09 DIAGNOSIS — M9901 Segmental and somatic dysfunction of cervical region: Secondary | ICD-10-CM | POA: Diagnosis not present

## 2022-04-09 DIAGNOSIS — M546 Pain in thoracic spine: Secondary | ICD-10-CM | POA: Diagnosis not present

## 2022-04-09 DIAGNOSIS — M62830 Muscle spasm of back: Secondary | ICD-10-CM | POA: Diagnosis not present

## 2022-04-10 ENCOUNTER — Other Ambulatory Visit: Payer: Self-pay

## 2022-04-10 DIAGNOSIS — E1165 Type 2 diabetes mellitus with hyperglycemia: Secondary | ICD-10-CM

## 2022-04-10 MED ORDER — "INSULIN SYRINGE 31G X 5/16"" 1 ML MISC": 3 refills | Status: DC

## 2022-04-11 ENCOUNTER — Encounter: Payer: Medicare HMO | Admitting: Dietician

## 2022-04-14 DIAGNOSIS — M9901 Segmental and somatic dysfunction of cervical region: Secondary | ICD-10-CM | POA: Diagnosis not present

## 2022-04-14 DIAGNOSIS — M62830 Muscle spasm of back: Secondary | ICD-10-CM | POA: Diagnosis not present

## 2022-04-14 DIAGNOSIS — M546 Pain in thoracic spine: Secondary | ICD-10-CM | POA: Diagnosis not present

## 2022-04-14 DIAGNOSIS — M9902 Segmental and somatic dysfunction of thoracic region: Secondary | ICD-10-CM | POA: Diagnosis not present

## 2022-04-17 DIAGNOSIS — E119 Type 2 diabetes mellitus without complications: Secondary | ICD-10-CM | POA: Diagnosis not present

## 2022-04-17 DIAGNOSIS — Z0100 Encounter for examination of eyes and vision without abnormal findings: Secondary | ICD-10-CM | POA: Diagnosis not present

## 2022-04-17 LAB — HM DIABETES EYE EXAM

## 2022-05-01 ENCOUNTER — Telehealth: Payer: Self-pay

## 2022-05-01 NOTE — Progress Notes (Signed)
Care Management & Coordination Services Pharmacy Team  Care Management & Coordination Services Pharmacy Team  Reason for Encounter: Medication coordination and delivery  Contacted patient to discuss medications and coordinate delivery from [facility] pharmacy. Spoke with patient on 05/01/2022   Cycle dispensing form sent to [facility] for review.   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  Any concerns about your medications? Patient denies  How often do you forget or accidentally miss a dose? Patient denies any missed doses  Is patient in packaging Yes  If yes  What is the date on your next pill pack? 05/01/2022  Any concerns or issues with your packaging? Patient is needing furosemide to change from breakfast to dinner.    Reason for Encounter: Hypertension  Current antihypertensive regimen:  Carvedilol 6.25 mg twice daily Losartan 50 mg daily  Patient verbally confirms he is taking the above medications as directed. Yes  Has patient bought a blood pressure monitor? Patient has not purchased a blood pressure monitor.   How often are you  checking your Blood Pressure?  Patient is not checking, he does not have a monitor.   Current home BP readings: Patient is not checking at home  Caffeine intake: Patient drinks 1-2 cups of coffee daily  Salt intake: Patient doesn't watch how much sodium is in his foods, he doesn't add salt to his food.   OTC medications including pseudoephedrine or NSAIDs? Patient denies  What recent interventions/DTPs have been made by any provider to improve Blood Pressure control since last CPP Visit: No recent interventions noted.   Any recent hospitalizations or ED visits since last visit with CPP? No recent hospital visits noted.  What diet changes have been made to improve Blood Pressure Control?  Dietary changes: No recent changes Breakfast - patient will have poptarts or a [place] sandwich Lunch - patient will have a sandwich Dinner - patient will have a meal with a meat and vegetable  What exercise is being done to improve your Blood Pressure Control?  Patient states he goes to the gym 3 times per week  Adherence Review: Is the patient currently on ACE/ARB medication? Yes  Does the patient have >5 day gap between last estimated fill dates? No  Star Rating Drugs:  Atorvastatin 20 mg  - last filled 04/10/2022 30 DS at [facility] Losartan 50 mg - last filled 04/10/2022 30 DS at [facility]   Gaps: AWV - completed 03/10/2022 Covid - overdue Urine ACR - overdue Cologuard - postponed  Chart Updates: Recent office visits:  None  Recent consult visits:  None  Hospital visits:  None  Medications: Outpatient Encounter Medications as of 05/01/2022  Medication Sig   ACCU-CHEK GUIDE test strip USE TO CHECK BLOOD SUGAR 2 TIMES DAILY   Accu-Chek Softclix Lancets lancets Check blood sugar 2 times daily   atorvastatin (LIPITOR) 20 MG tablet Take 1 tablet (20 mg total) by mouth daily.   Blood Glucose Monitoring Suppl (ACCU-CHEK GUIDE) w/Device KIT Check blood sugar 2 times daily   carvedilol  (COREG) 6.25 MG tablet Take 1 tablet (6.25 mg total) by mouth 2 (two) times daily with a meal.   digoxin (LANOXIN) 0.125 MG tablet Take 1 tablet (125 mcg total) by mouth daily.   furosemide (LASIX) 20 MG tablet Take 1 tablet (20 mg total) by mouth daily.   glucose blood (ONETOUCH VERIO) test strip USE AS INSTRUCTED TO CHECK BLOOD SUGAR 2 TIMES A DAY.   insulin NPH-regular Human (NOVOLIN 70/30) (70-30) 100 UNIT/ML injection Inject 40 Units into the skin 2 (two) times daily with a meal.   Insulin Syringe-Needle U-100 (INSULIN SYRINGE 1CC/31GX5/16") 31G X 5/16" 1 ML MISC Use 3 needles per day   lithium carbonate 300 MG capsule Take 3 capsules (900 mg total) by mouth daily.   losartan (COZAAR) 50 MG tablet Take 1 tablet (50 mg total) by mouth daily.   omeprazole (PRILOSEC) 20 MG capsule Take 1 capsule (20 mg total) by mouth daily.   QUEtiapine (SEROQUEL) 200 MG tablet Take 600 mg by mouth at bedtime. Per patient taking 500 mg   sildenafil (VIAGRA) 50 MG tablet Take 1 tablet (50 mg total) by mouth daily as needed for erectile dysfunction. Do not take more then once in 24 hours.   tadalafil (CIALIS) 20 MG tablet Take 0.5-1 tablets (10-20 mg total) by mouth every other day as needed for erectile dysfunction.   No facility-administered encounter medications on file as of 05/01/2022.   Fill History:   Dispensed Days Supply Quantity Provider Pharmacy  atorvastatin 20 mg tablet 04/10/2022 30 30 each      Dispensed Days Supply Quantity Provider Pharmacy  carvedilol 6.25 mg tablet 04/10/2022 30 60 each      Dispensed Days Supply Quantity Provider Pharmacy  digoxin 125 mcg (0.125 mg) tablet 04/10/2022 30 30 each      Dispensed Days Supply Quantity Provider Pharmacy  furosemide 20 mg tablet 04/10/2022 30 30 each      Dispensed Days Supply Quantity Provider Pharmacy  Novolin 70/30 U-100 Insulin 100 unit/mL subcutaneous suspension 04/10/2022 25 20 mL      Dispensed Days Supply Quantity Provider Pharmacy   lithium carbonate 300 mg capsule 04/10/2022 30 90 each      Dispensed Days Supply Quantity Provider Pharmacy  losartan 50 mg tablet 04/10/2022 30 30 each      Dispensed Days Supply Quantity Provider Pharmacy  omeprazole 20 mg capsule,delayed release 04/12/2022 30 30 each      Dispensed Days Supply Quantity Provider Pharmacy  quetiapine 200 mg tablet 04/11/2022 30 90 each      Recent Office Vitals: BP Readings from Last 3 Encounters:  02/04/22 (!) 140/70  01/24/22 132/70  01/22/22 100/68   Pulse Readings from Last 3 Encounters:  02/04/22 73  01/22/22 73  12/23/21 91    Wt Readings from Last 3 Encounters:  03/10/22 300 lb (136.1 kg)  02/04/22 (!) 300 lb 9.6 oz (136.4 kg)  01/22/22 (!) 301 lb 4.8 oz (136.7 kg)     Kidney Function Lab Results  Component Value Date/Time   CREATININE 1.28 01/30/2022 12:18 PM  CREATININE 1.34 10/28/2021 11:44 AM   GFR 58.57 (L) 01/30/2022 12:18 PM   GFRNONAA >60 07/07/2021 04:03 PM   GFRAA >90 04/18/2011 08:00 PM       Latest Ref Rng & Units 01/30/2022   12:18 PM 10/28/2021   11:44 AM 08/30/2021    1:01 PM  BMP  Glucose 70 - 99 mg/dL 276  148  269   BUN 6 - 23 mg/dL '14  12  10   '$ Creatinine 0.40 - 1.50 mg/dL 1.28  1.34  1.32   BUN/Creat Ratio 10 - 24   8   Sodium 135 - 145 mEq/L 140  142  143   Potassium 3.5 - 5.1 mEq/L 4.2  3.8  3.9   Chloride 96 - 112 mEq/L 104  105  102   CO2 19 - 32 mEq/L '26  29  23   '$ Calcium 8.4 - 10.5 mg/dL 9.4  9.2  9.1    [place] Pharmacist Assistant [phone]

## 2022-05-13 ENCOUNTER — Other Ambulatory Visit: Payer: Self-pay

## 2022-05-13 DIAGNOSIS — E1165 Type 2 diabetes mellitus with hyperglycemia: Secondary | ICD-10-CM

## 2022-05-13 MED ORDER — ACCU-CHEK GUIDE VI STRP: ORAL_STRIP | 3 refills | Status: DC

## 2022-05-13 MED ORDER — ACCU-CHEK SOFTCLIX LANCETS MISC: 3 refills | Status: DC

## 2022-05-13 NOTE — Telephone Encounter (Signed)
What dose of Mounjaro needs to be sent to pharmacy

## 2022-05-19 ENCOUNTER — Other Ambulatory Visit (INDEPENDENT_AMBULATORY_CARE_PROVIDER_SITE_OTHER): Payer: Medicare HMO

## 2022-05-19 DIAGNOSIS — Z794 Long term (current) use of insulin: Secondary | ICD-10-CM

## 2022-05-19 DIAGNOSIS — E1165 Type 2 diabetes mellitus with hyperglycemia: Secondary | ICD-10-CM | POA: Diagnosis not present

## 2022-05-19 LAB — BASIC METABOLIC PANEL
BUN: 18 mg/dL (ref 6–23)
CO2: 29 mEq/L (ref 19–32)
Calcium: 9.1 mg/dL (ref 8.4–10.5)
Chloride: 101 mEq/L (ref 96–112)
Creatinine, Ser: 1.49 mg/dL (ref 0.40–1.50)
GFR: 48.71 mL/min — ABNORMAL LOW (ref >60.00)
Glucose, Bld: 281 mg/dL — ABNORMAL HIGH (ref 70–99)
Potassium: 4.3 mEq/L (ref 3.5–5.1)
Sodium: 138 mEq/L (ref 135–145)

## 2022-05-19 LAB — HEMOGLOBIN A1C: Hgb A1c MFr Bld: 8.2 % — ABNORMAL HIGH (ref 4.6–6.5)

## 2022-05-21 ENCOUNTER — Encounter: Payer: Self-pay | Admitting: Endocrinology

## 2022-05-21 ENCOUNTER — Ambulatory Visit: Payer: Medicare HMO | Admitting: Endocrinology

## 2022-05-21 VITALS — BP 170/80 | HR 95 | Ht 73.0 in | Wt 299.2 lb

## 2022-05-21 DIAGNOSIS — E1165 Type 2 diabetes mellitus with hyperglycemia: Secondary | ICD-10-CM

## 2022-05-21 DIAGNOSIS — Z794 Long term (current) use of insulin: Secondary | ICD-10-CM

## 2022-05-21 NOTE — Patient Instructions (Addendum)
Take 70 units insulin in am  Check blood sugars on waking up days a week  Also check blood sugars about 2 hours after meals and do this after different meals by rotation  Recommended blood sugar levels on waking up are 90-130 and about 2 hours after meal is 130-180  Please bring your blood sugar monitor to each visit, thank you   Call [facility] for the [facility] 3 sensor. [phone].

## 2022-05-21 NOTE — Progress Notes (Signed)
Patient ID: Matthew Barnes, male   DOB: 05-Dec-1955, 67 y.o.   MRN: [id number]    Reason for Appointment: Followup for Type 2 Diabetes    History of Present Illness:          Diagnosis: Type 2 diabetes mellitus, date of diagnosis:   2011       Past history:  He was started on metformin at diagnosis and apparently did have fairly good control initially However about a year later because of poor control he was given insulin in addition. He had been taking Lantus insulin until about 2/15, up to 80 units a day However he does not think his blood sugars  Were controlled with this Because of higher A1c of 13.1% he was referred here for diabetes management in 6/15; was started on glucose monitoring and insulin was changed from low dose Levemir to Humalog mix 70 units a day He was also started on Victoza  to help with blood sugar control and facilitate weight loss on his initial consultation  Recent history:   INSULIN regimen is described as: Novolin mix 70/30 with syringe, 60 units at breakfast, 60 units in p.m  Oral hypoglycemic drugs the patient is taking are: None, was on Ozempic 0 mg weekly  A1c 8.3  This has been as low as 5.8 and in 5/22 was 8.3   Current blood sugar patterns, management of diabetes and problems: 8/23 He blood sugars are higher again Recently most of his blood sugars are over 200 He says that he did not continue 70 units insulin in the morning because of tendency to low sugars later in the day when he is not eating a consistent breakfast or lunch Not refill his Ozempic because it was very expensive in the donut hole but did not let us know  Again says that he is sometimes forgetting his evening dinner insulin especially when eating out Not consistently checking blood sugars at bedtime Weight is about the same  He has a gym available in his apartment complex   Bfst: pop tarts 8 am, 1-2 pm , dinner 6-7 pm Side effects from medications have been: Polyuria from  Jardiance  Glucose monitoring:  done about 1 times a day or less       Glucometer: Accu-Chek  Blood Glucose readings    PRE-MEAL Fasting Lunch Dinner Bedtime Overall  Glucose range: 155-309      Mean/median:     211   POST-MEAL PC Breakfast PC Lunch PC Dinner  Glucose range:   207-245  Mean/median:       Glycemic control:   Lab Results  Component Value Date   HGBA1C 8.2 (H) 05/19/2022   HGBA1C 8.3 (H) 10/28/2021   HGBA1C 6.9 (A) 08/21/2021   Lab Results  Component Value Date   MICROALBUR 1.9 02/28/2021   LDLCALC 38 10/28/2021   CREATININE 1.49 05/19/2022   Lab Results  Component Value Date   FRUCTOSAMINE 305 (H) 01/30/2022   FRUCTOSAMINE 290 (H) 08/15/2021   FRUCTOSAMINE 301 (H) 06/18/2021    Self-care: The diet that the patient has been following is: None, unable to control portions and carbs  when depressed  Meals: 3 meals per day (dinner 6 pm-10 pm)        Dietician visit: Most recent: never      CDE visit: 08/2013             Weight history: baseline 295  Wt Readings from Last 3 Encounters:  05/21/22 299 lb  3.2 oz (135.7 kg)  03/10/22 300 lb (136.1 kg)  02/04/22 (!) 300 lb 9.6 oz (136.4 kg)   Lab on 05/19/2022  Component Date Value Ref Range Status   Sodium 05/19/2022 138  135 - 145 mEq/L Final   Potassium 05/19/2022 4.3  3.5 - 5.1 mEq/L Final   Chloride 05/19/2022 101  96 - 112 mEq/L Final   CO2 05/19/2022 29  19 - 32 mEq/L Final   Glucose, Bld 05/19/2022 281 (H)  70 - 99 mg/dL Final   BUN 05/19/2022 18  6 - 23 mg/dL Final   Creatinine, Ser 05/19/2022 1.49  0.40 - 1.50 mg/dL Final   GFR 05/19/2022 48.71 (L)  >60.00 mL/min Final   Calculated using the CKD-EPI Creatinine Equation (2021)   Calcium 05/19/2022 9.1  8.4 - 10.5 mg/dL Final   Hgb A1c MFr Bld 05/19/2022 8.2 (H)  4.6 - 6.5 % Final   Glycemic Control Guidelines for People with Diabetes:Non Diabetic:  <6%Goal of Therapy: <7%Additional Action Suggested:  >8%       Allergies as of 05/21/2022        Reactions   Influenza Vaccines Swelling, Other (See Comments)   Reports of swelling of the arm and then was "sick" for 10 days        Medication List        Accurate as of May 21, 2022  9:37 AM. If you have any questions, ask your nurse or doctor.          Accu-Chek Guide w/Device Kit Check blood sugar 2 times daily   Accu-Chek Softclix Lancets lancets Check blood sugar 2 times daily   atorvastatin 20 MG tablet Commonly known as: LIPITOR Take 1 tablet (20 mg total) by mouth daily.   carvedilol 6.25 MG tablet Commonly known as: COREG Take 1 tablet (6.25 mg total) by mouth 2 (two) times daily with a meal.   digoxin 0.125 MG tablet Commonly known as: LANOXIN Take 1 tablet (125 mcg total) by mouth daily.   furosemide 20 MG tablet Commonly known as: LASIX Take 1 tablet (20 mg total) by mouth daily.   INSULIN SYRINGE 1CC/31GX5/16" 31G X 5/16" 1 ML Misc Use 3 needles per day   lithium carbonate 300 MG capsule Take 3 capsules (900 mg total) by mouth daily.   losartan 50 MG tablet Commonly known as: COZAAR Take 1 tablet (50 mg total) by mouth daily.   NovoLIN 70/30 (70-30) 100 UNIT/ML injection Generic drug: insulin NPH-regular Human Inject 40 Units into the skin 2 (two) times daily with a meal.   omeprazole 20 MG capsule Commonly known as: PRILOSEC Take 1 capsule (20 mg total) by mouth daily.   OneTouch Verio test strip Generic drug: glucose blood USE AS INSTRUCTED TO CHECK BLOOD SUGAR 2 TIMES A DAY. What changed: Another medication with the same name was removed. Continue taking this medication, and follow the directions you see here. Changed by: Elayne Snare, MD   QUEtiapine 200 MG tablet Commonly known as: SEROQUEL Take 600 mg by mouth at bedtime. Per patient taking 500 mg   sildenafil 50 MG tablet Commonly known as: Viagra Take 1 tablet (50 mg total) by mouth daily as needed for erectile dysfunction. Do not take more then once in 24 hours.    tadalafil 20 MG tablet Commonly known as: CIALIS Take 0.5-1 tablets (10-20 mg total) by mouth every other day as needed for erectile dysfunction.        Allergies:  Allergies  Allergen Reactions   Influenza Vaccines Swelling and Other (See Comments)    Reports of swelling of the arm and then was "sick" for 10 days    Past Medical History:  Diagnosis Date   Bipolar II disorder - Managed by Marye Round [phone]) 05/03/2013   Diabetes mellitus    Dysphagia    Hyperlipidemia    Hypertension    Unspecified hypothyroidism 05/03/2013    Past Surgical History:  Procedure Laterality Date   ICD IMPLANT N/A 05/20/2021   Procedure: ICD IMPLANT;  Surgeon: Evans Lance, MD;  Location: [facility];  Service: Cardiovascular;  Laterality: N/A;   KNEE SURGERY     scope on left knee   RIGHT/LEFT HEART CATH AND CORONARY ANGIOGRAPHY N/A 08/07/2020   Procedure: RIGHT/LEFT HEART CATH AND CORONARY ANGIOGRAPHY;  Surgeon: [facility]ie Man, MD;  Location: [facility];  Service: Cardiovascular;  Laterality: N/A;    Family History  Problem Relation Age of Onset   Diabetes Mother    Hypertension Mother    Heart disease Mother    Heart disease Father    Heart attack Father 19   Diabetes Father    Hypertension Father    Depression Father    Diabetes Sister    Depression Paternal Grandmother    Depression Other     Social History:  reports that he has never smoked. He has never used smokeless tobacco. He reports that he does not drink alcohol and does not use drugs.    Review of Systems   HYPERTENSION:  He is taking losartan 50 mg and carvedilol 6.25 mg from his cardiologist Blood pressure is relatively higher in the office  Blood pressure readings as follows:  BP Readings from Last 3 Encounters:  05/21/22 (!) 170/80  02/04/22 (!) 140/70  01/24/22 132/70   Renal dysfunction: He has mild fluctuation in his creatinine level  No previous history of  microalbuminuria  Lab Results  Component Value Date   CREATININE 1.49 05/19/2022   CREATININE 1.28 01/30/2022   CREATININE 1.34 10/28/2021   HEART failure: He has had CHF and was in the emergency room for treatment in July Continues to be on Lasix and Lanoxin along with Coreg and Jardiance       LIPIDS: He has had marked increase in triglycerides previously, now back to normal.  Taking 20 mg Lipitor  Did not have any coronary artery disease  He is also followed by PCP       Lab Results  Component Value Date   CHOL 91 10/28/2021   HDL 23.50 (L) 10/28/2021   LDLCALC 38 10/28/2021   LDLDIRECT 73.0 09/01/2017   TRIG 144.0 10/28/2021   CHOLHDL 4 10/28/2021       Thyroid:  Previously had mild hypothyroidism for a few years TSH subsequently has been normal consistently without taking any thyroid supplements  Has been on lithium long-term  Lab Results  Component Value Date   TSH 1.90 02/04/2022        He has had some numbness in his feet or hands, more on the right side, using gabapentin more frequently now with increased symptoms  Findings on last exam:   Patchy decrease in sensation in the toes especially right and decreased on the plantar surface also   LABS:  Lab on 05/19/2022  Component Date Value Ref Range Status   Sodium 05/19/2022 138  135 - 145 mEq/L Final   Potassium 05/19/2022 4.3  3.5 - 5.1 mEq/L  Final   Chloride 05/19/2022 101  96 - 112 mEq/L Final   CO2 05/19/2022 29  19 - 32 mEq/L Final   Glucose, Bld 05/19/2022 281 (H)  70 - 99 mg/dL Final   BUN 05/19/2022 18  6 - 23 mg/dL Final   Creatinine, Ser 05/19/2022 1.49  0.40 - 1.50 mg/dL Final   GFR 05/19/2022 48.71 (L)  >60.00 mL/min Final   Calculated using the CKD-EPI Creatinine Equation (2021)   Calcium 05/19/2022 9.1  8.4 - 10.5 mg/dL Final   Hgb A1c MFr Bld 05/19/2022 8.2 (H)  4.6 - 6.5 % Final   Glycemic Control Guidelines for People with Diabetes:Non Diabetic:  <6%Goal of Therapy: <7%Additional  Action Suggested:  >8%     Physical Examination:  BP (!) 170/80 (BP Location: Left Arm, Patient Position: Sitting, Cuff Size: Normal)   Pulse 95   Ht '6\' 1"'$  (1.854 m)   Wt 299 lb 3.2 oz (135.7 kg)   SpO2 98%   BMI 39.47 kg/m      ASSESSMENT/PLAN:   Diabetes type 2, on insulin  See history of present illness for detailed discussion of his current management, blood sugar patterns and problems identified  A1c is 8.2 Fructosamine is 305  He is on a regimen of premixed insulin currently  His blood sugars are worse with not taking Ozempic Also not watching his diet as before BMI is about 40   He needs to increase his dinner insulin to 55-  Keep an insulin pen with him when going out and possibly eating in the evening Encouraged him to exercise regularly and can use the elliptical We will try to get him the freestyle libre sensor again through the [employer] and [facility] He can use his phone app but if he needs any help starting we can have him see the nurse educator   There are no Patient Instructions on file for this visit.  Total visit time including counseling = 30 minutes   Elayne Snare 05/21/2022, 9:37 AM   Note: This office note was prepared with Dragon voice recognition system technology. Any transcriptional errors that result from this process are unintentional.

## 2022-05-22 DIAGNOSIS — F3132 Bipolar disorder, current episode depressed, moderate: Secondary | ICD-10-CM | POA: Diagnosis not present

## 2022-05-22 DIAGNOSIS — Z79899 Other long term (current) drug therapy: Secondary | ICD-10-CM | POA: Diagnosis not present

## 2022-05-22 MED ORDER — TIRZEPATIDE 2.5 MG/0.5ML ~~LOC~~ SOAJ: 2.5000 mg | SUBCUTANEOUS | 0 refills | Status: DC

## 2022-05-22 MED ORDER — TIRZEPATIDE 5 MG/0.5ML ~~LOC~~ SOAJ: 5.0000 mg | SUBCUTANEOUS | 1 refills | Status: DC

## 2022-05-23 ENCOUNTER — Ambulatory Visit: Payer: Medicare HMO | Admitting: Dietician

## 2022-05-27 ENCOUNTER — Ambulatory Visit: Payer: Medicare HMO

## 2022-05-27 DIAGNOSIS — I420 Dilated cardiomyopathy: Secondary | ICD-10-CM

## 2022-05-27 DIAGNOSIS — F3132 Bipolar disorder, current episode depressed, moderate: Secondary | ICD-10-CM | POA: Diagnosis not present

## 2022-05-28 ENCOUNTER — Telehealth: Payer: Self-pay

## 2022-05-28 LAB — CUP PACEART REMOTE DEVICE CHECK
Battery Remaining Longevity: 130 mo
Battery Voltage: 3.03 V
Brady Statistic RV Percent Paced: 0 %
Date Time Interrogation Session: 20240227033423
HighPow Impedance: 77 Ohm
Implantable Lead Connection Status: 753985
Implantable Lead Implant Date: 20230220
Implantable Lead Location: 753860
Implantable Lead Model: 6935
Implantable Pulse Generator Implant Date: 20230220
Lead Channel Impedance Value: 494 Ohm
Lead Channel Impedance Value: 551 Ohm
Lead Channel Pacing Threshold Amplitude: 0.5 V
Lead Channel Pacing Threshold Pulse Width: 0.4 ms
Lead Channel Sensing Intrinsic Amplitude: 2.125 mV
Lead Channel Sensing Intrinsic Amplitude: 2.125 mV
Lead Channel Setting Pacing Amplitude: 2 V
Lead Channel Setting Pacing Pulse Width: 0.4 ms
Lead Channel Setting Sensing Sensitivity: 0.3 mV
Zone Setting Status: 755011
Zone Setting Status: 755011

## 2022-05-28 NOTE — Telephone Encounter (Signed)
Alert received from [facility]:  Scheduled remote reviewed. Normal device function.   118 NSVT, 2 HVR NSVT, EGM's show both short runs of NSVT as well as T wave oversensing - route to triage  RV sensitivity programmed at 0.3 mV. Pt scheduled for yearly appt next week with Dr. Lovena Le for assessment and reprogramming.    Example of oversensing:

## 2022-05-30 ENCOUNTER — Telehealth: Payer: Self-pay

## 2022-05-30 NOTE — Progress Notes (Signed)
Care Management & Coordination Services Pharmacy Team  Reason for Encounter: Medication coordination and delivery  Contacted patient to discuss medications and coordinate delivery from [facility]. Spoke with patient on 05/30/2022   Cycle dispensing form sent to [facility] for review.   Last adherence delivery date:05/14/2022      Patient is due for next adherence delivery on: 06/12/2022  This delivery to include: Adherence Packaging  30 Days  Atorvastatin 20 mg  - 1 tablet at bedtime Carvedilol 6.25 mg - 1 tablet at breakfast and bedtime Digoxin 125 mcg - 1 tablet at bedtime Furosemide 20 mg - 1 tablet at dinner Losartan 50 mg - 1 tablet at bedtime Omeprazole 20 mg - 1 tablet at breakfast Quetiapine 200 mg - 3 tablets at bedtime Novolin 70/30 - inject 40 units twice daily (Dr. Ronnie Derby recent note states take 70 units in the AM) Called Dr Dwyane Dee, LM with his nurse, will need a new rx with new instructions to be sent to [facility].  Mounjaro '5mg'$ /0.18m - inject 5 mg weekly  Patient declined the following medications this month: Accucheck guide test strips - plenty on hand Lithium 300 mg - 3 tablets at bedtime, pt states medication D/C by RChapman Moss Confirmed delivery date of 06/12/2022, advised patient that pharmacy will contact them the morning of delivery.  Any concerns about your medications? Patient states [facility]   How often do you forget or accidentally miss a dose? Patient denies  Is patient in packaging Yes  If yes  What is the date on your next pill pack? 05/30/2022  Any concerns or issues with your packaging? Patient denies   Chart review: Recent office visits:  None  Recent consult visits:  05/21/2022 AElayne SnareMD (endocrinology) - Patient was seen for Uncontrolled type 2 diabetes mellitus with hyperglycemia, with long-term current use of insulin. Started Tirzepatide '5mg'$ , inject 5 mg  weekly.  Novolin 70/30 Take 70 units insulin in am.  Hospital visits:  None  Medications: Outpatient Encounter Medications as of 05/30/2022  Medication Sig   Accu-Chek Softclix Lancets lancets Check blood sugar 2 times daily   atorvastatin (LIPITOR) 20 MG tablet Take 1 tablet (20 mg total) by mouth daily.   Blood Glucose Monitoring Suppl (ACCU-CHEK GUIDE) w/Device KIT Check blood sugar 2 times daily   carvedilol (COREG) 6.25 MG tablet Take 1 tablet (6.25 mg total) by mouth 2 (two) times daily with a meal.   digoxin (LANOXIN) 0.125 MG tablet Take 1 tablet (125 mcg total) by mouth daily.   furosemide (LASIX) 20 MG tablet Take 1 tablet (20 mg total) by mouth daily.   glucose blood (ONETOUCH VERIO) test strip USE AS INSTRUCTED TO CHECK BLOOD SUGAR 2 TIMES A DAY.   insulin NPH-regular Human (NOVOLIN 70/30) (70-30) 100 UNIT/ML injection Inject 40 Units into the skin 2 (two) times daily with a meal.   Insulin Syringe-Needle U-100 (INSULIN SYRINGE 1CC/31GX5/16") 31G X 5/16" 1 ML MISC Use 3 needles per day   lithium carbonate 300 MG capsule Take 3 capsules (900 mg total) by mouth daily.   losartan (COZAAR) 50 MG tablet Take 1 tablet (50 mg total) by mouth daily.   omeprazole (PRILOSEC) 20 MG capsule Take 1 capsule (20 mg total) by mouth daily.   QUEtiapine (SEROQUEL) 200 MG tablet Take 600 mg by mouth at bedtime. Per patient taking 500 mg   sildenafil (VIAGRA) 50 MG tablet Take 1  tablet (50 mg total) by mouth daily as needed for erectile dysfunction. Do not take more then once in 24 hours.   tadalafil (CIALIS) 20 MG tablet Take 0.5-1 tablets (10-20 mg total) by mouth every other day as needed for erectile dysfunction.   tirzepatide [facility]) 2.5 MG/0.5ML Pen Inject 2.5 mg into the skin once a week.   tirzepatide [facility]) 5 MG/0.5ML Pen Inject 5 mg into the skin once a week.   No facility-administered encounter medications on file as of 05/30/2022.   BP Readings from Last 3 Encounters:  05/21/22  (!) 170/80  02/04/22 (!) 140/70  01/24/22 132/70    Pulse Readings from Last 3 Encounters:  05/21/22 95  02/04/22 73  01/22/22 73    Lab Results  Component Value Date/Time   HGBA1C 8.2 (H) 05/19/2022 09:50 AM   HGBA1C 8.3 (H) 10/28/2021 11:44 AM   Lab Results  Component Value Date   CREATININE 1.49 05/19/2022   BUN 18 05/19/2022   GFR 48.71 (L) 05/19/2022   GFRNONAA >60 07/07/2021   GFRAA >90 04/18/2011   NA 138 05/19/2022   K 4.3 05/19/2022   CALCIUM 9.1 05/19/2022   CO2 29 05/19/2022   [place] Pharmacist Assistant [phone]

## 2022-06-03 ENCOUNTER — Encounter: Payer: Medicare HMO | Admitting: Internal Medicine

## 2022-06-09 ENCOUNTER — Telehealth: Payer: Self-pay

## 2022-06-09 NOTE — Progress Notes (Signed)
Care Management & Coordination Services Pharmacy Note  06/10/2022 Name:  Matthew Barnes MRN:  [id number] DOB:  08-25-1955  Summary: -A1C not at goal <7, endo started mounjaro since then, pt reports sugars WNL since then -BP not at goal <130/80  Recommendations/Changes made from today's visit: -Provided sample of Mounjaro 2.'5mg'$  while patient tries to find a pharmacy that has '5mg'$  in stock to titrate up as instructed by endo -Recommend checking sugars BID and keep a log -Recommend obtaining a BP cuff otc to check at home, as patient claims white coat syndrome for elevated BP readings in office  Follow up plan: BP call in 1 month DM call in 2 months Pharmacist visit in 4 months   Subjective: Matthew Barnes is an 67 y.o. year old male who is a primary patient of Farrel Conners, MD.  The care coordination team was consulted for assistance with disease management and care coordination needs.    Engaged with patient by telephone for follow up visit.  Recent office visits: None  Recent consult visits: 05/21/2022 Elayne Snare MD (endocrinology) - Patient was seen for Uncontrolled type 2 diabetes mellitus with hyperglycemia, with long-term current use of insulin. Started Tirzepatide '5mg'$ , inject 5 mg weekly.  Novolin 70/30 Take 70 units insulin in am.   Hospital visits: None in previous 6 months   Objective:  Lab Results  Component Value Date   CREATININE 1.49 05/19/2022   BUN 18 05/19/2022   GFR 48.71 (L) 05/19/2022   EGFR 60 08/30/2021   GFRNONAA >60 07/07/2021   GFRAA >90 04/18/2011   NA 138 05/19/2022   K 4.3 05/19/2022   CALCIUM 9.1 05/19/2022   CO2 29 05/19/2022   GLUCOSE 281 (H) 05/19/2022    Lab Results  Component Value Date/Time   HGBA1C 8.2 (H) 05/19/2022 09:50 AM   HGBA1C 8.3 (H) 10/28/2021 11:44 AM   FRUCTOSAMINE 305 (H) 01/30/2022 10:03 AM   FRUCTOSAMINE 290 (H) 08/15/2021 09:06 AM   GFR 48.71 (L) 05/19/2022 09:50 AM   GFR 58.57 (L) 01/30/2022 12:18 PM    MICROALBUR 1.9 02/28/2021 08:52 AM   MICROALBUR 1.1 07/14/2019 03:14 PM    Last diabetic Eye exam:  Lab Results  Component Value Date/Time   HMDIABEYEEXA No Retinopathy 01/11/2019 12:00 AM    Last diabetic Foot exam: No results found for: "HMDIABFOOTEX"   Lab Results  Component Value Date   CHOL 91 10/28/2021   HDL 23.50 (L) 10/28/2021   LDLCALC 38 10/28/2021   LDLDIRECT 73.0 09/01/2017   TRIG 144.0 10/28/2021   CHOLHDL 4 10/28/2021       Latest Ref Rng & Units 10/28/2021   11:44 AM 07/07/2021    4:03 PM 02/28/2021    8:52 AM  Hepatic Function  Total Protein 6.0 - 8.3 g/dL 6.9  6.8  6.7   Albumin 3.5 - 5.2 g/dL 4.2  4.0  4.0   AST 0 - 37 U/L '21  26  21   '$ ALT 0 - 53 U/L '17  20  18   '$ Alk Phosphatase 39 - 117 U/L 53  50  51   Total Bilirubin 0.2 - 1.2 mg/dL 0.6  0.6  0.7     Lab Results  Component Value Date/Time   TSH 1.90 02/04/2022 10:39 AM   TSH 2.39 06/18/2021 02:11 PM   FREET4 0.83 06/18/2021 02:11 PM   FREET4 0.79 05/21/2020 10:57 AM       Latest Ref Rng & Units 07/07/2021  4:03 PM 05/14/2021    2:24 PM 02/28/2021    8:52 AM  CBC  WBC 4.0 - 10.5 K/uL 6.2  6.2  5.4   Hemoglobin 13.0 - 17.0 g/dL 12.6  13.1  12.7   Hematocrit 39.0 - 52.0 % 39.3  40.7  39.8   Platelets 150 - 400 K/uL 132  133  116.0     Lab Results  Component Value Date/Time   VD25OH 43.46 02/28/2021 08:52 AM   VITAMINB12 277 02/28/2021 08:52 AM   VITAMINB12 397 07/31/2020 07:40 AM    Clinical ASCVD: No  The ASCVD Risk score (Arnett DK, et al., 2019) failed to calculate for the following reasons:   The valid total cholesterol range is 130 to 320 mg/dL        03/10/2022   12:41 PM 12/23/2021   10:24 AM 02/25/2021   11:21 AM  Depression screen PHQ 2/9  Decreased Interest 0 2 0  Down, Depressed, Hopeless 0 1 0  PHQ - 2 Score 0 3 0  Altered sleeping  0   Tired, decreased energy  0   Change in appetite  2   Feeling bad or failure about yourself   0   Trouble concentrating  0    Moving slowly or fidgety/restless  0   Suicidal thoughts  0   PHQ-9 Score  5   Difficult doing work/chores  Somewhat difficult      Social History   Tobacco Use  Smoking Status Never  Smokeless Tobacco Never   BP Readings from Last 3 Encounters:  05/21/22 (!) 170/80  02/04/22 (!) 140/70  01/24/22 132/70   Pulse Readings from Last 3 Encounters:  05/21/22 95  02/04/22 73  01/22/22 73   Wt Readings from Last 3 Encounters:  05/21/22 299 lb 3.2 oz (135.7 kg)  03/10/22 300 lb (136.1 kg)  02/04/22 (!) 300 lb 9.6 oz (136.4 kg)   BMI Readings from Last 3 Encounters:  05/21/22 39.47 kg/m  03/10/22 39.58 kg/m  02/04/22 39.66 kg/m    Allergies  Allergen Reactions   Influenza Vaccines Swelling and Other (See Comments)    Reports of swelling of the arm and then was "sick" for 10 days    Medications Reviewed Today     Reviewed by Maren Reamer, RPH (Pharmacist) on 06/10/22 at 1157  Med List Status: <None>   Medication Order Taking? Sig Documenting Provider Last Dose Status Informant  Accu-Chek Softclix Lancets lancets [id number] No Check blood sugar 2 times daily Elayne Snare, MD Taking Active   atorvastatin (LIPITOR) 20 MG tablet [id number] No Take 1 tablet (20 mg total) by mouth daily. Farrel Conners, MD Taking Active   Blood Glucose Monitoring Suppl (ACCU-CHEK GUIDE) w/Device Drucie Opitz [id number] No Check blood sugar 2 times daily Elayne Snare, MD Taking Active Self  carvedilol (COREG) 6.25 MG tablet [id number] No Take 1 tablet (6.25 mg total) by mouth 2 (two) times daily with a meal. Farrel Conners, MD Taking Active   digoxin (LANOXIN) 0.125 MG tablet [id number] No Take 1 tablet (125 mcg total) by mouth daily. Farrel Conners, MD Taking Active   furosemide (LASIX) 20 MG tablet [id number] No Take 1 tablet (20 mg total) by mouth daily. Farrel Conners, MD Taking Active   glucose blood [facility] VERIO) test strip [id number] No USE AS INSTRUCTED TO CHECK BLOOD SUGAR 2 TIMES  A DAY. Elayne Snare, MD Taking Active Self  insulin NPH-regular Human (NOVOLIN 70/30) (70-30) 100 UNIT/ML injection [id number]  No Inject 40 Units into the skin 2 (two) times daily with a meal. Elayne Snare, MD Taking Active   Insulin Syringe-Needle U-100 (INSULIN SYRINGE 1CC/31GX5/16") 31G X 5/16" 1 ML MISC [id number] No Use 3 needles per day Elayne Snare, MD Taking Active   lithium carbonate 300 MG capsule [id number] No Take 3 capsules (900 mg total) by mouth daily. Caren Macadam, MD Taking Active Self  losartan (COZAAR) 50 MG tablet [id number] No Take 1 tablet (50 mg total) by mouth daily. Lorretta Harp, MD Taking Active   omeprazole (PRILOSEC) 20 MG capsule [id number] No Take 1 capsule (20 mg total) by mouth daily. Farrel Conners, MD Taking Active   QUEtiapine (SEROQUEL) 200 MG tablet [id number] No Take 600 mg by mouth at bedtime. Per patient taking 500 mg [provider] Taking Active Self  sildenafil (VIAGRA) 50 MG tablet [id number] No Take 1 tablet (50 mg total) by mouth daily as needed for erectile dysfunction. Do not take more then once in 24 hours. Lucretia Kern, DO Taking Active Self  tadalafil (CIALIS) 20 MG tablet [id number] No Take 0.5-1 tablets (10-20 mg total) by mouth every other day as needed for erectile dysfunction. Farrel Conners, MD Taking Active   tirzepatide [facility]) 2.5 MG/0.5ML Pen [id number]  Inject 2.5 mg into the skin once a week. Elayne Snare, MD  Active   tirzepatide [facility]) 5 MG/0.5ML Pen [id number]  Inject 5 mg into the skin once a week. Elayne Snare, MD  Active             SDOH:  (Social Determinants of Health) assessments and interventions performed: Yes SDOH Interventions    Flowsheet Row Care Coordination from 06/10/2022 in [place] Office Visit from 03/10/2022 in [place] at [place] Management from 01/24/2022 in [place] at [place] Management from  02/05/2021 in [place] at [place] Interventions Intervention Not Indicated Intervention Not Indicated -- --  Housing Interventions Intervention Not Indicated Intervention Not Indicated -- --  Transportation Interventions -- Intervention Not Indicated -- Intervention Not Indicated  Utilities Interventions -- Intervention Not Indicated -- --  Alcohol Usage Interventions -- Intervention Not Indicated (Score <7) -- --  Financial Strain Interventions -- Intervention Not Indicated Other (Comment)  [working on patient assistance for Ozempic] Other (Comment)  [working on PAP]  Physical Activity Interventions -- Intervention Not Indicated -- --  Stress Interventions -- Intervention Not Indicated -- --  Social Connections Interventions -- Intervention Not Indicated -- --       Medication Assistance: None required.  Patient affirms current coverage meets needs.  Medication Access: Within the past 30 days, how often has patient missed a dose of medication? No Is a pillbox or other method used to improve adherence? Yes  Factors that may affect medication adherence?  Supply of mounjaro backorder Are meds synced by current pharmacy? Yes  Are meds delivered by current pharmacy? Yes  Does patient experience delays in [place] up medications due to transportation concerns? No   [facility] Reviewed: Is patient disadvantaged to use [facility]?: No  Current Rx insurance plan: [employer] Name and location of Current pharmacy:  [facility] - [place], [place] - [address] [place] [address] Phone: [phone] Fax: [phone]  [facility] services reviewed with patient today?: No  Patient requests to transfer care to  [facility]?: No  Reason patient declined to change pharmacies: Patient is already actively enrolled with [facility]  Compliance/Adherence/Medication fill history: Care Gaps: AWV - scheduled 03/13/2023 Last BP - 170/80 on 05/21/2022 Last A1C - 8.2 on 05/19/2022 Last eye exam - 08/29/2021 Last foot exam - 11/01/2021 Covid - overdue Urine ACR - overdue Fecal DNA - postponed  Star-Rating Drugs: Atorvastatin 20 mg - last filled 05/12/2022 30 DS at [facility] Losartan 50 mg - last filled 05/12/2022 30 DS at [facility]     Assessment/Plan   Hypertension (BP goal <130/80) -Uncontrolled -Current treatment: Carvedilol 6.'25mg'$  BID Lasix '20mg'$  1 qd Losartan '50mg'$  1 qd -Medications previously tried: Amlodipine, HCTZ, Entresto -Current home readings: No cuff to check at home -Current dietary habits: careful not to add a lot of salt to food -Current exercise habits: Not discussed -Denies hypotensive/hypertensive symptoms -Educated on BP goals and benefits of medications for prevention of heart attack, stroke and kidney damage; Daily salt intake goal < 2300 mg; Exercise goal of 150 minutes per week; Importance of home blood pressure monitoring; -Counseled to monitor BP at home once weekly with a cuff, document, and provide log at future appointments -Recommended to continue current medication Recommended purchase of an OTC BP monitor for home checking, as patient claims is only elevated in office  Diabetes (A1c goal <7%) -Uncontrolled -Current medications: Mounjaro 2.'5mg'$  once weekly Appropriate, Query effective Novolin 70/30 40 units BID Appropriate, Effective, Safe, Accessible -Medications previously tried: invokona, Humalog mix, Ozempic, Victoza -Current home glucose readings Reports a low in 60s if he takes insulin and forgets to eat, denies any sugars higher than 170 -Denies hypoglycemic/hyperglycemic symptoms -Current meal patterns:  Drinks only water -Current exercise: not discussed -Educated on A1c and blood sugar goals; Complications of diabetes including kidney damage, retinal  damage, and cardiovascular disease; Proper insulin injection technique; Benefits of routine self-monitoring of blood sugar; -Counseled to check feet daily and get yearly eye exams -Recommended to continue current medication Supply issue with '5mg'$  pen, patient reports responding well with 2.'5mg'$  dose so another sample has been set aside for patient  until he can locate '5mg'$  to start   [place] Pharmacist [phone]

## 2022-06-09 NOTE — Progress Notes (Signed)
Care Management & Coordination Services Pharmacy Team  Reason for Encounter: Appointment Reminder  Contacted patient to confirm telephone appointment with Burman Riis, PharmD on 06/09/2022 at 11:15. Spoke with patient on 06/09/2022   Do you have any problems getting your medications? No  What is your top health concern you would like to discuss at your upcoming visit? Patient denies Have you seen any other providers since your last visit with PCP? No  Care Gaps: AWV - scheduled 03/13/2023 Last BP - 170/80 on 05/21/2022 Last A1C - 8.2 on 05/19/2022 Last eye exam - 08/29/2021 Last foot exam - 11/01/2021 Covid - overdue Urine ACR - overdue Fecal DNA - postponed   Star Rating Drugs: Atorvastatin 20 mg - last filled 05/12/2022 30 DS at [facility] Losartan 50 mg - last filled 05/12/2022 30 DS at [place] [phone]

## 2022-06-10 ENCOUNTER — Ambulatory Visit: Payer: Medicare HMO

## 2022-06-10 ENCOUNTER — Telehealth: Payer: Self-pay | Admitting: *Deleted

## 2022-06-10 MED ORDER — MOUNJARO 2.5 MG/0.5ML ~~LOC~~ SOAJ: 2.5000 mg | SUBCUTANEOUS | 0 refills | Status: DC

## 2022-06-10 NOTE — Telephone Encounter (Signed)
-----   Message from Maren Reamer, [facility] sent at 06/10/2022 11:58 AM EDT ----- Regarding: Darcel Bayley Sample Hello!  I provided patient with one box of Mounjaro 2.5mg  sample and need that logged in patient's chart (unfortunately I cannot do that in EPIC).  Lot: [id number] A Exp: 12/20/23  Thank you! [place] Pharmacist [phone]

## 2022-06-10 NOTE — Telephone Encounter (Signed)
Rx entered as below.

## 2022-06-25 DIAGNOSIS — F3132 Bipolar disorder, current episode depressed, moderate: Secondary | ICD-10-CM | POA: Diagnosis not present

## 2022-07-01 ENCOUNTER — Other Ambulatory Visit: Payer: Self-pay | Admitting: Family Medicine

## 2022-07-01 DIAGNOSIS — E7800 Pure hypercholesterolemia, unspecified: Secondary | ICD-10-CM

## 2022-07-02 ENCOUNTER — Telehealth: Payer: Self-pay

## 2022-07-02 NOTE — Progress Notes (Signed)
Remote ICD transmission.   

## 2022-07-02 NOTE — Progress Notes (Signed)
Care Management & Coordination Services Pharmacy Team  Reason for Encounter: Medication coordination and delivery  Contacted patient to discuss medications and coordinate delivery from [facility]. Spoke with patient on 07/02/2022   Cycle dispensing form sent to [facility] for review.   Last adherence delivery date:06/12/2022      Patient is due for next adherence delivery on: 07/14/2022  This delivery to include: Adherence Packaging  30 Days  Atorvastatin 20 mg  - 1 tablet at bedtime Carvedilol 6.25 mg - 1 tablet at breakfast and bedtime Digoxin 125 mcg - 1 tablet at bedtime Furosemide 20 mg - 1 tablet at dinner Mounjaro 5mg /0.79ml - inject 5 mg weekly Losartan 50 mg - 1 tablet at bedtime Omeprazole 20 mg - 1 tablet at breakfast Quetiapine 200 mg - 3 tablets at bedtime Novolin 70/30 - inject 40 units twice daily  Sertraline 100 mg at breakfast - Rx'ed by Chapman Moss, not in epic Accucheck guide test strips   Patient declined the following medications this month: Lithium 300 mg - pt states medication D/C by Chapman Moss   Confirmed delivery date of 07/14/2022, advised patient that pharmacy will contact them the morning of delivery.  Any concerns about your medications? Patient denies  How often do you forget or accidentally miss a dose? Patient denies  Is patient in packaging Yes  If yes  What is the date on your next pill pack? 07/02/2022 PM doses  Any concerns or issues with your packaging? Patient denies   Recent blood pressure readings are as follows: Patient has not been checking  Recent blood glucose readings are as follows: Patient states his readings have been between 125-150 fasting and non fasting   Chart review: Recent office visits:  None  Recent consult visits:  None  Hospital visits:  None  Medications: Outpatient Encounter Medications as of 07/02/2022  Medication Sig   Accu-Chek Softclix Lancets lancets Check blood sugar 2 times  daily   atorvastatin (LIPITOR) 20 MG tablet TAKE ONE TABLET BY MOUTH EVERYDAY AT BEDTIME   Blood Glucose Monitoring Suppl (ACCU-CHEK GUIDE) w/Device KIT Check blood sugar 2 times daily   carvedilol (COREG) 6.25 MG tablet TAKE ONE TABLET BY MOUTH AT BREAKFAST AND AT BEDTIME   digoxin (LANOXIN) 0.125 MG tablet Take 1 tablet (125 mcg total) by mouth daily.   furosemide (LASIX) 20 MG tablet TAKE ONE TABLET BY MOUTH EVERY EVENING   glucose blood (ONETOUCH VERIO) test strip USE AS INSTRUCTED TO CHECK BLOOD SUGAR 2 TIMES A DAY.   insulin NPH-regular Human (NOVOLIN 70/30) (70-30) 100 UNIT/ML injection Inject 40 Units into the skin 2 (two) times daily with a meal.   Insulin Syringe-Needle U-100 (INSULIN SYRINGE 1CC/31GX5/16") 31G X 5/16" 1 ML MISC Use 3 needles per day   lithium carbonate 300 MG capsule Take 3 capsules (900 mg total) by mouth daily.   losartan (COZAAR) 50 MG tablet Take 1 tablet (50 mg total) by mouth daily.   omeprazole (PRILOSEC) 20 MG capsule Take 1 capsule (20 mg total) by mouth daily.   QUEtiapine (SEROQUEL) 200 MG tablet Take 600 mg by mouth at bedtime. Per patient taking 500 mg   sildenafil (VIAGRA) 50 MG tablet Take 1 tablet (50 mg total) by mouth daily as needed for erectile dysfunction. Do not take more then once in 24 hours.   tadalafil (CIALIS) 20 MG tablet Take 0.5-1 tablets (10-20 mg total) by mouth every other day as needed for erectile dysfunction.   tirzepatide [facility]) 2.5  MG/0.5ML Pen Inject 2.5 mg into the skin once a week.   tirzepatide [facility]) 2.5 MG/0.5ML Pen Inject 2.5 mg into the skin once a week.   tirzepatide [facility]) 5 MG/0.5ML Pen Inject 5 mg into the skin once a week.   No facility-administered encounter medications on file as of 07/02/2022.   BP Readings from Last 3 Encounters:  05/21/22 (!) 170/80  02/04/22 (!) 140/70  01/24/22 132/70    Pulse Readings from Last 3 Encounters:  05/21/22 95  02/04/22 73  01/22/22 73    Lab Results  Component  Value Date/Time   HGBA1C 8.2 (H) 05/19/2022 09:50 AM   HGBA1C 8.3 (H) 10/28/2021 11:44 AM   Lab Results  Component Value Date   CREATININE 1.49 05/19/2022   BUN 18 05/19/2022   GFR 48.71 (L) 05/19/2022   GFRNONAA >60 07/07/2021   GFRAA >90 04/18/2011   NA 138 05/19/2022   K 4.3 05/19/2022   CALCIUM 9.1 05/19/2022   CO2 29 05/19/2022   [place] Pharmacist Assistant [phone]

## 2022-07-22 ENCOUNTER — Telehealth: Payer: Self-pay | Admitting: Cardiovascular Disease

## 2022-07-22 NOTE — Telephone Encounter (Signed)
Matthew Barnes from [facility] is calling because Dr. Jesusita Oka wanted to make sure the pre op clearance was for a simple extraction with moderate to severe bleeding to be expected. The patient is scheduled to have his dental extraction today at 1PM. Please advise

## 2022-07-22 NOTE — Telephone Encounter (Signed)
Clearance was sent over per Eligha Bridegroom, NP Pre op APP today. I will re-fax clearance notes.

## 2022-07-22 NOTE — Telephone Encounter (Signed)
   Primary Cardiologist: Nanetta Batty, MD  Chart reviewed as part of pre-operative protocol coverage. Simple dental extractions (1-2 teeth) are considered low risk procedures per guidelines and generally do not require any specific cardiac clearance. It is also generally accepted that for simple extractions and dental cleanings, there is no need to interrupt blood thinner therapy.   SBE prophylaxis is not required for the patient.  **Please note, patient has an ICD implanted. If you are using equipment that will interfere with the device, you will need to receive instructions from our Device clinic.   I will route this recommendation to the requesting party via Epic fax function and remove from pre-op pool.  Please call with questions.  Levi Aland, NP-C  07/22/2022, 8:44 AM [address] Office [phone] Fax [phone]

## 2022-07-22 NOTE — Telephone Encounter (Signed)
   Pre-operative Risk Assessment    Patient Name: Matthew Barnes  DOB: 1955-04-14 MRN: [id number]     Request for Surgical Clearance    Procedure:  Dental Extraction - Amount of Teeth to be Pulled:  2  Date of Surgery:  Clearance 07/22/22                                 Surgeon:  Dr. Loralie Champagne Surgeon's Group or Practice Name:  [facility] Phone number:  [phone] Fax number:  [phone]   Type of Clearance Requested:   - Medical    Type of Anesthesia:  Local    Additional requests/questions:   Patient is coming in at 12:00 pm today for the extractions.  Minna Antis   07/22/2022, 8:27 AM

## 2022-07-28 ENCOUNTER — Telehealth: Payer: Self-pay

## 2022-07-28 NOTE — Progress Notes (Signed)
Care Management & Coordination Services Pharmacy Team  Reason for Encounter: Diabetes  and hypertension disease state, Medication coordination and delivery  Contacted patient to discuss medications and coordinate delivery from [facility]. Spoke with patient on 07/28/2022   Cycle dispensing form sent to [facility] for review.   Last adherence delivery date:07/14/2022      Patient is due for next adherence delivery on: 08/12/2022  This delivery to include: Adherence Packaging  30 Days  Atorvastatin 20 mg  - 1 tablet at bedtime Carvedilol 6.25 mg - 1 tablet at breakfast and bedtime Digoxin 125 mcg - 1 tablet at bedtime Furosemide 20 mg - 1 tablet at dinner Mounjaro 5mg /0.91ml - inject 5 mg weekly Losartan 50 mg - 1 tablet at bedtime Omeprazole 20 mg - 1 tablet at breakfast Quetiapine 200 mg - 3 tablets at bedtime Novolin 70/30 - inject 40 units twice daily  Sertraline 100 mg at breakfast - Rx'ed by Donnie Aho, not in epic Accucheck guide test strips   Patient declined the following medications this month: Lithium 300 mg - pt states medication D/C by Donnie Aho   Confirmed delivery date of 08/12/2022, advised patient that pharmacy will contact them the morning of delivery.  Any concerns about your medications? Patient denies  How often do you forget or accidentally miss a dose? Patient denies  Is patient in packaging Yes    Recent blood pressure readings are as follows: Patient is not checking blood pressures  Current antihypertensive regimen:  Carvedilol 6.25 mg 1 tablet twice daily Furosemide 20 mg daily Losartan 50 mg daily Patient verbally confirms he is taking the above medications as directed. Yes    Recent blood glucose readings are as follows: Patient states his last few readings have been 144, 128, 200 (he ate something sweet)  He states his readings have been steady between 120 - 140  Current antihyperglycemic regimen:  Novolin 70/30 inject 40  units twice daily Mounjaro 5 mg inject 5 mg weekly  Patient verbally confirms he is taking the above medications as directed. Yes  What diet changes have been made to improve diabetes control? Portion control  What recent interventions/DTPs have been made to improve glycemic control:  No recent interventions  Patient denies hypoglycemic symptoms, including None  Patient denies hyperglycemic symptoms, including none  How often are you checking your blood sugar? Patient is checking twice daily  During the week, how often does your blood glucose drop below 70? Patient states he never has readings below 70  Are you checking your feet daily/regularly? Patient states he is checking daily  Chart review: Recent office visits:  None  Recent consult visits:  None  Hospital visits:  None  Medications: Outpatient Encounter Medications as of 07/28/2022  Medication Sig   Accu-Chek Softclix Lancets lancets Check blood sugar 2 times daily   atorvastatin (LIPITOR) 20 MG tablet TAKE ONE TABLET BY MOUTH EVERYDAY AT BEDTIME   Blood Glucose Monitoring Suppl (ACCU-CHEK GUIDE) w/Device KIT Check blood sugar 2 times daily   carvedilol (COREG) 6.25 MG tablet TAKE ONE TABLET BY MOUTH AT BREAKFAST AND AT BEDTIME   digoxin (LANOXIN) 0.125 MG tablet Take 1 tablet (125 mcg total) by mouth daily.   furosemide (LASIX) 20 MG tablet TAKE ONE TABLET BY MOUTH EVERY EVENING   glucose blood (ONETOUCH VERIO) test strip USE AS INSTRUCTED TO CHECK BLOOD SUGAR 2 TIMES A DAY.   insulin NPH-regular Human (NOVOLIN 70/30) (70-30) 100 UNIT/ML injection Inject 40 Units into the  skin 2 (two) times daily with a meal.   Insulin Syringe-Needle U-100 (INSULIN SYRINGE 1CC/31GX5/16") 31G X 5/16" 1 ML MISC Use 3 needles per day   lithium carbonate 300 MG capsule Take 3 capsules (900 mg total) by mouth daily.   losartan (COZAAR) 50 MG tablet Take 1 tablet (50 mg total) by mouth daily.   omeprazole (PRILOSEC) 20 MG capsule Take 1  capsule (20 mg total) by mouth daily.   QUEtiapine (SEROQUEL) 200 MG tablet Take 600 mg by mouth at bedtime. Per patient taking 500 mg   sildenafil (VIAGRA) 50 MG tablet Take 1 tablet (50 mg total) by mouth daily as needed for erectile dysfunction. Do not take more then once in 24 hours.   tadalafil (CIALIS) 20 MG tablet Take 0.5-1 tablets (10-20 mg total) by mouth every other day as needed for erectile dysfunction.   tirzepatide [facility]) 2.5 MG/0.5ML Pen Inject 2.5 mg into the skin once a week.   tirzepatide [facility]) 2.5 MG/0.5ML Pen Inject 2.5 mg into the skin once a week.   tirzepatide [facility]) 5 MG/0.5ML Pen Inject 5 mg into the skin once a week.   No facility-administered encounter medications on file as of 07/28/2022.   BP Readings from Last 3 Encounters:  05/21/22 (!) 170/80  02/04/22 (!) 140/70  01/24/22 132/70    Pulse Readings from Last 3 Encounters:  05/21/22 95  02/04/22 73  01/22/22 73    Lab Results  Component Value Date/Time   HGBA1C 8.2 (H) 05/19/2022 09:50 AM   HGBA1C 8.3 (H) 10/28/2021 11:44 AM   Lab Results  Component Value Date   CREATININE 1.49 05/19/2022   BUN 18 05/19/2022   GFR 48.71 (L) 05/19/2022   GFRNONAA >60 07/07/2021   GFRAA >90 04/18/2011   NA 138 05/19/2022   K 4.3 05/19/2022   CALCIUM 9.1 05/19/2022   CO2 29 05/19/2022   [name] CMA  Clinical Pharmacist Assistant [phone]

## 2022-08-01 ENCOUNTER — Other Ambulatory Visit (INDEPENDENT_AMBULATORY_CARE_PROVIDER_SITE_OTHER): Payer: Medicare HMO

## 2022-08-01 DIAGNOSIS — Z794 Long term (current) use of insulin: Secondary | ICD-10-CM | POA: Diagnosis not present

## 2022-08-01 DIAGNOSIS — E1165 Type 2 diabetes mellitus with hyperglycemia: Secondary | ICD-10-CM | POA: Diagnosis not present

## 2022-08-01 LAB — HEMOGLOBIN A1C: Hgb A1c MFr Bld: 7 % — ABNORMAL HIGH (ref 4.6–6.5)

## 2022-08-01 LAB — MICROALBUMIN / CREATININE URINE RATIO
Creatinine,U: 63.6 mg/dL
Microalb Creat Ratio: 1.3 mg/g (ref 0.0–30.0)
Microalb, Ur: 0.8 mg/dL (ref 0.0–1.9)

## 2022-08-01 LAB — GLUCOSE, RANDOM: Glucose, Bld: 153 mg/dL — ABNORMAL HIGH (ref 70–99)

## 2022-08-05 ENCOUNTER — Ambulatory Visit: Payer: Medicare HMO | Admitting: Endocrinology

## 2022-08-06 ENCOUNTER — Other Ambulatory Visit: Payer: Self-pay | Admitting: Endocrinology

## 2022-08-06 ENCOUNTER — Other Ambulatory Visit: Payer: Self-pay | Admitting: Family Medicine

## 2022-08-06 DIAGNOSIS — I5023 Acute on chronic systolic (congestive) heart failure: Secondary | ICD-10-CM

## 2022-08-06 DIAGNOSIS — E1165 Type 2 diabetes mellitus with hyperglycemia: Secondary | ICD-10-CM

## 2022-08-07 ENCOUNTER — Encounter: Payer: Self-pay | Admitting: Endocrinology

## 2022-08-07 ENCOUNTER — Ambulatory Visit: Payer: Medicare HMO | Admitting: Endocrinology

## 2022-08-07 VITALS — BP 122/72 | HR 87 | Ht 73.0 in | Wt 296.4 lb

## 2022-08-07 DIAGNOSIS — E1165 Type 2 diabetes mellitus with hyperglycemia: Secondary | ICD-10-CM | POA: Diagnosis not present

## 2022-08-07 DIAGNOSIS — I10 Essential (primary) hypertension: Secondary | ICD-10-CM

## 2022-08-07 DIAGNOSIS — Z794 Long term (current) use of insulin: Secondary | ICD-10-CM | POA: Diagnosis not present

## 2022-08-07 MED ORDER — TIRZEPATIDE 10 MG/0.5ML ~~LOC~~ SOAJ: 10.0000 mg | SUBCUTANEOUS | 1 refills | Status: DC

## 2022-08-07 MED ORDER — DEXCOM G7 SENSOR MISC
1.0000 | 3 refills | Status: DC
Start: 2022-08-07 — End: 2023-03-11

## 2022-08-07 MED ORDER — DEXCOM G7 RECEIVER DEVI
0 refills | Status: DC
Start: 2022-08-07 — End: 2023-03-11

## 2022-08-07 NOTE — Progress Notes (Signed)
Patient ID: Matthew Barnes, male   DOB: [birth year], 67 y.o.   MRN: [id number]    Reason for Appointment: Followup for Type 2 Diabetes    History of Present Illness:          Diagnosis: Type 2 diabetes mellitus, date of diagnosis:   2011       Past history:  He was started on metformin at diagnosis and apparently did have fairly good control initially However about a year later because of poor control he was given insulin in addition. He had been taking Lantus insulin until about 2/15, up to 80 units a day However he does not think his blood sugars  Were controlled with this Because of higher A1c of 13.1% he was referred here for diabetes management in 6/15; was started on glucose monitoring and insulin was changed from low dose Levemir to Humalog mix 70 units a day He was also started on Victoza  to help with blood sugar control and facilitate weight loss on his initial consultation  Recent history:   INSULIN regimen is described as: Novolin mix 70/30 with syringe, 60 units at breakfast, 40 units in p.m  Non-insulin hypoglycemic drugs the patient is taking are: Mounjaro 5 mg weekly  A1c 7 This has been as low as 5.8 and highest about 8.3   Current blood sugar patterns, management of diabetes and problems: 8/23 He has been able to start [facility] and now taking 5 mg weekly for about 3 weeks He says that it helps him control his appetite However he still is periodically eating more carbohydrates and sweets Although his A1c is better not clear how to assess his blood sugar readings as he is mostly checking blood sugars midday and sometimes right after eating Diet has been variable but he has had some weight loss, does think that he has a weight of 287 at home He says he is trying to do recumbent bike at home for exercise On his own he has reduced his evening insulin by 20 units instead of taking 16 units and recently fasting reading has been as low as 150 In the last few days he has  checked blood sugars mostly midday and difficult to assess his readings especially after dinner He was recommended the freestyle libre sensor but he says the company did not return his calls   Breakfast:  8 am, 1-2 pm , dinner 6-7 pm Side effects from medications have been: Polyuria from Jardiance  Glucose monitoring:  done about 1 times a day or less       Glucometer: Accu-Chek  Blood Glucose readings    PRE-MEAL Fasting Midday Dinner Bedtime Overall  Glucose range: 115-175 129-277 102-159 197   Mean/median:     171     Previously:   PRE-MEAL Fasting Lunch Dinner Bedtime Overall  Glucose range: 155-309      Mean/median:     211   POST-MEAL PC Breakfast PC Lunch PC Dinner  Glucose range:   207-245  Mean/median:       Glycemic control:   Lab Results  Component Value Date   HGBA1C 7.0 (H) 08/01/2022   HGBA1C 8.2 (H) 05/19/2022   HGBA1C 8.3 (H) 10/28/2021   Lab Results  Component Value Date   MICROALBUR 0.8 08/01/2022   LDLCALC 38 10/28/2021   CREATININE 1.49 05/19/2022   Lab Results  Component Value Date   FRUCTOSAMINE 305 (H) 01/30/2022   FRUCTOSAMINE 290 (H) 08/15/2021   FRUCTOSAMINE 301 (H) 06/18/2021  Self-care: The diet that the patient has been following is: None, unable to control portions and carbs  when depressed  Meals: 3 meals per day (dinner 6 pm-10 pm)        Dietician visit: Most recent: never      CDE visit: 08/2013             Weight history: baseline 295  Wt Readings from Last 3 Encounters:  08/07/22 296 lb 6.4 oz (134.4 kg)  05/21/22 299 lb 3.2 oz (135.7 kg)  03/10/22 300 lb (136.1 kg)   Lab on 08/01/2022  Component Date Value Ref Range Status   Microalb, Ur 08/01/2022 0.8  0.0 - 1.9 mg/dL Final   Creatinine,U 16/12/9602 63.6  mg/dL Final   Microalb Creat Ratio 08/01/2022 1.3  0.0 - 30.0 mg/g Final   Glucose, Bld 08/01/2022 153 (H)  70 - 99 mg/dL Final   Hgb V4U MFr Bld 08/01/2022 7.0 (H)  4.6 - 6.5 % Final   Glycemic Control  Guidelines for People with Diabetes:Non Diabetic:  <6%Goal of Therapy: <7%Additional Action Suggested:  >8%       Allergies as of 08/07/2022       Reactions   Influenza Vaccines Swelling, Other (See Comments)   Reports of swelling of the arm and then was "sick" for 10 days        Medication List        Accurate as of Aug 07, 2022 10:50 AM. If you have any questions, ask your nurse or doctor.          Accu-Chek Guide w/Device Kit Check blood sugar 2 times daily   Accu-Chek Softclix Lancets lancets Check blood sugar 2 times daily   atorvastatin 20 MG tablet Commonly known as: LIPITOR TAKE ONE TABLET BY MOUTH EVERYDAY AT BEDTIME   carvedilol 6.25 MG tablet Commonly known as: COREG TAKE ONE TABLET BY MOUTH AT BREAKFAST AND AT BEDTIME   digoxin 0.125 MG tablet Commonly known as: LANOXIN TAKE ONE TABLET BY MOUTH EVERYDAY AT BEDTIME   furosemide 20 MG tablet Commonly known as: LASIX TAKE ONE TABLET BY MOUTH EVERY EVENING   INSULIN SYRINGE 1CC/31GX5/16" 31G X 5/16" 1 ML Misc Use 3 needles per day   lithium carbonate 300 MG capsule Take 3 capsules (900 mg total) by mouth daily.   losartan 50 MG tablet Commonly known as: COZAAR Take 1 tablet (50 mg total) by mouth daily.   NovoLIN 70/30 (70-30) 100 UNIT/ML injection Generic drug: insulin NPH-regular Human Inject 40 units into THE SKIN twice daily WITH A MEAL   omeprazole 20 MG capsule Commonly known as: PRILOSEC Take 1 capsule (20 mg total) by mouth daily.   OneTouch Verio test strip Generic drug: glucose blood USE AS INSTRUCTED TO CHECK BLOOD SUGAR 2 TIMES A DAY.   QUEtiapine 200 MG tablet Commonly known as: SEROQUEL Take 600 mg by mouth at bedtime. Per patient taking 500 mg   sildenafil 50 MG tablet Commonly known as: Viagra Take 1 tablet (50 mg total) by mouth daily as needed for erectile dysfunction. Do not take more then once in 24 hours.   tadalafil 20 MG tablet Commonly known as: CIALIS Take  0.5-1 tablets (10-20 mg total) by mouth every other day as needed for erectile dysfunction.   tirzepatide 5 MG/0.5ML Pen Commonly known as: MOUNJARO Inject 5 mg into the skin once a week.   tirzepatide 2.5 MG/0.5ML Pen Commonly known as: MOUNJARO Inject 2.5 mg into the skin once a  week.   Mounjaro 2.5 MG/0.5ML Pen Generic drug: tirzepatide Inject 2.5 mg into the skin once a week.        Allergies:  Allergies  Allergen Reactions   Influenza Vaccines Swelling and Other (See Comments)    Reports of swelling of the arm and then was "sick" for 10 days    Past Medical History:  Diagnosis Date   Bipolar II disorder - Managed by Jacqulyn Ducking [phone]) 05/03/2013   Diabetes mellitus    Dysphagia    Hyperlipidemia    Hypertension    Unspecified hypothyroidism 05/03/2013    Past Surgical History:  Procedure Laterality Date   ICD IMPLANT N/A 05/20/2021   Procedure: ICD IMPLANT;  Surgeon: Marinus Maw, MD;  Location: [facility] INVASIVE CV LAB;  Service: Cardiovascular;  Laterality: N/A;   KNEE SURGERY     scope on left knee   RIGHT/LEFT HEART CATH AND CORONARY ANGIOGRAPHY N/A 08/07/2020   Procedure: RIGHT/LEFT HEART CATH AND CORONARY ANGIOGRAPHY;  Surgeon: Marykay Lex, MD;  Location[facility] INVASIVE CV LAB;  Service: Cardiovascular;  Laterality: N/A;    Family History  Problem Relation Age of Onset   Diabetes Mother    Hypertension Mother    Heart disease Mother    Heart disease Father    Heart attack Father 43   Diabetes Father    Hypertension Father    Depression Father    Diabetes Sister    Depression Paternal Grandmother    Depression Other     Social History:  reports that he has never smoked. He has never used smokeless tobacco. He reports that he does not drink alcohol and does not use drugs.    Review of Systems   HYPERTENSION:  He is taking losartan 50 mg and carvedilol 6.25 mg from his cardiologist  Blood pressure readings as follows:  BP Readings  from Last 3 Encounters:  08/07/22 122/72  05/21/22 (!) 170/80  02/04/22 (!) 140/70   Renal dysfunction: This has been very able  No previous history of microalbuminuria  Lab Results  Component Value Date   CREATININE 1.49 05/19/2022   CREATININE 1.28 01/30/2022   CREATININE 1.34 10/28/2021   HEART failure: He has had CHF and was in the emergency room for treatment in July Continues to be on Lasix and Lanoxin along with Coreg and Jardiance       LIPIDS: He has had marked increase in triglycerides previously  Taking 20 mg Lipitor  Does not have any coronary artery disease  He is also followed by PCP       Lab Results  Component Value Date   CHOL 91 10/28/2021   HDL 23.50 (L) 10/28/2021   LDLCALC 38 10/28/2021   LDLDIRECT 73.0 09/01/2017   TRIG 144.0 10/28/2021   CHOLHDL 4 10/28/2021       Thyroid:  Previously had mild hypothyroidism for a few years TSH subsequently has been normal consistently without taking any thyroid supplements  Has been on lithium long-term  Lab Results  Component Value Date   TSH 1.90 02/04/2022        He has had some numbness in his feet or hands, more on the right side, using gabapentin more frequently now with increased symptoms  Findings on last exam:   Patchy decrease in sensation in the toes especially right and decreased on the plantar surface also   LABS:  Lab on 08/01/2022  Component Date Value Ref Range Status   Microalb, Ur 08/01/2022 0.8  0.0 - 1.9 mg/dL Final   Creatinine,U 54/11/8117 63.6  mg/dL Final   Microalb Creat Ratio 08/01/2022 1.3  0.0 - 30.0 mg/g Final   Glucose, Bld 08/01/2022 153 (H)  70 - 99 mg/dL Final   Hgb J4N MFr Bld 08/01/2022 7.0 (H)  4.6 - 6.5 % Final   Glycemic Control Guidelines for People with Diabetes:Non Diabetic:  <6%Goal of Therapy: <7%Additional Action Suggested:  >8%     Physical Examination:  BP 122/72   Pulse 87   Ht 6\' 1"  (1.854 m)   Wt 296 lb 6.4 oz (134.4 kg)   SpO2 98%   BMI  39.11 kg/m      ASSESSMENT/PLAN:   Diabetes type 2, on insulin  See history of present illness for detailed discussion of his current management, blood sugar patterns and problems identified  A1c is 7  He is on a regimen of premixed insulin twice a day with Mounjaro 5 mg weekly recently  His blood sugars are generally improving and averaging about 177 in the last 1 month download However his diet is somewhat inconsistent and is getting some readings over 200 at times However he has reduced his evening insulin by 20 units He has started losing a little weight even though this is minimal Tolerating 5 mg Mounjaro recently  Currently will not change his insulin unless his blood sugars start getting close to or below 100 He will try 10 mg of Mounjaro from the next prescription and potentially may need to go up to 15 mg subsequently if supplies are available, he will let us know if he has any nausea with this  However advised him to check his blood sugars by rotation at different times Will send prescription again to [facility] to get his freestyle libre sensor, however will try to get him the Dexcom through the local pharmacy first Once he is able to get this we can train him how to start using his phone app Discussed benefits of CGM with better feedback on his dietary habits and blood sugar patterns  Hypertension: Well-controlled now   Renal dysfunction: Will need periodic follow-up Microalbumin normal  There are no Patient Instructions on file for this visit.  Total visit time including counseling = 30 minutes   Reather Littler 08/07/2022, 10:50 AM   Note: This office note was prepared with Dragon voice recognition system technology. Any transcriptional errors that result from this process are unintentional.

## 2022-08-08 NOTE — Progress Notes (Signed)
Spoke with patient, he was notified Matthew Barnes is on back order with [facility], patient would like this script to be forwarded to [facility] notified.  Patient has a gap concerning a colonoscopy, patient declines and does not intend to have this procedure.

## 2022-08-26 ENCOUNTER — Ambulatory Visit (INDEPENDENT_AMBULATORY_CARE_PROVIDER_SITE_OTHER): Payer: Medicare HMO

## 2022-08-26 DIAGNOSIS — I420 Dilated cardiomyopathy: Secondary | ICD-10-CM | POA: Diagnosis not present

## 2022-08-27 LAB — CUP PACEART REMOTE DEVICE CHECK
Battery Remaining Longevity: 128 mo
Battery Voltage: 3.03 V
Brady Statistic RV Percent Paced: 0.01 %
Date Time Interrogation Session: 20240528001703
HighPow Impedance: 78 Ohm
Implantable Lead Connection Status: 753985
Implantable Lead Implant Date: 20230220
Implantable Lead Location: 753860
Implantable Lead Model: 6935
Implantable Pulse Generator Implant Date: 20230220
Lead Channel Impedance Value: 494 Ohm
Lead Channel Impedance Value: 570 Ohm
Lead Channel Pacing Threshold Amplitude: 0.375 V
Lead Channel Pacing Threshold Pulse Width: 0.4 ms
Lead Channel Sensing Intrinsic Amplitude: 1.625 mV
Lead Channel Sensing Intrinsic Amplitude: 1.625 mV
Lead Channel Setting Pacing Amplitude: 2 V
Lead Channel Setting Pacing Pulse Width: 0.4 ms
Lead Channel Setting Sensing Sensitivity: 0.3 mV
Zone Setting Status: 755011
Zone Setting Status: 755011

## 2022-09-01 ENCOUNTER — Telehealth: Payer: Self-pay | Admitting: *Deleted

## 2022-09-01 ENCOUNTER — Telehealth: Payer: Self-pay

## 2022-09-01 DIAGNOSIS — K219 Gastro-esophageal reflux disease without esophagitis: Secondary | ICD-10-CM

## 2022-09-01 DIAGNOSIS — I5023 Acute on chronic systolic (congestive) heart failure: Secondary | ICD-10-CM

## 2022-09-01 MED ORDER — DIGOXIN 125 MCG PO TABS
ORAL_TABLET | ORAL | 0 refills | Status: DC
Start: 2022-09-01 — End: 2022-10-01

## 2022-09-01 MED ORDER — OMEPRAZOLE 20 MG PO CPDR
20.0000 mg | DELAYED_RELEASE_CAPSULE | Freq: Every day | ORAL | 0 refills | Status: DC
Start: 2022-09-01 — End: 2022-10-01

## 2022-09-01 NOTE — Telephone Encounter (Signed)
-----   Message from Sherrill Raring, [facility] sent at 09/01/2022 12:24 PM EDT ----- Regarding: Med Refills [facility] requesting refills on behalf of the patient for the following prescriptions:  Omeprazole 20mg  1 capsule by mouth daily Digoxin 0.125mg  1 tab by mouth daily at bedtime  Pharmacy Info: [facility] - [place], [place] - [address]  Phone: [phone] Fax: [phone]   Thank you! Sherrill Raring Clinical Pharmacist [phone]

## 2022-09-01 NOTE — Telephone Encounter (Signed)
Rx done. 

## 2022-09-01 NOTE — Progress Notes (Signed)
Care Management & Coordination Services Pharmacy Team  Reason for Encounter: Medication coordination and delivery  Contacted patient to discuss medications and coordinate delivery from [facility] pharmacy. Spoke with patient on 09/01/2022   Cycle dispensing form sent to [facility] for review.   Last adherence delivery date:08/12/2022      Patient is due for next adherence delivery on: 09/11/2022  This delivery to include: Adherence Packaging  30 Days  Atorvastatin 20 mg  - 1 tablet at bedtime Carvedilol 6.25 mg - 1 tablet at breakfast and bedtime Digoxin 125 mcg - 1 tablet at bedtime Furosemide 20 mg - 1 tablet at dinner Losartan 50 mg - 1 tablet at bedtime Omeprazole 20 mg - 1 tablet at breakfast Quetiapine 200 mg - 3 tablets at bedtime Novolin 70/30 - inject 40 units twice daily  Sertraline 100 mg at breakfast - Rx'ed by Matthew Barnes, not in epic Accucheck guide test strips  Mounjaro 10 mg/0.29ml pen - inject 10 mg weekly  Patient declined the following medications this month: Dexcom Sensor Device Dexcom Receiver  Confirmed delivery date of 09/11/2022, advised patient that pharmacy will contact them the morning of delivery.  Any concerns about your medications? Patient denies   How often do you forget or accidentally miss a dose? Patient denies   Is patient in packaging Yes  If yes  What is the date on your next pill pack? 09/01/2022  Any concerns or issues with your packaging? Patient denies  Chart review: Recent office visits:  None  Recent consult visits:  08/07/2022 Matthew Littler MD (endocrinology) - Patient was seen for Uncontrolled type 2 diabetes mellitus with hyperglycemia, with long-term current use of insulin and an additional concern. Increased Mounjaro to 10 mg SQ weekly.   Hospital visits:  None  Medications: Outpatient Encounter Medications as of 09/01/2022  Medication Sig   Accu-Chek Softclix Lancets lancets Check blood sugar 2 times daily    atorvastatin (LIPITOR) 20 MG tablet TAKE ONE TABLET BY MOUTH EVERYDAY AT BEDTIME   Blood Glucose Monitoring Suppl (ACCU-CHEK GUIDE) w/Device KIT Check blood sugar 2 times daily   carvedilol (COREG) 6.25 MG tablet TAKE ONE TABLET BY MOUTH AT BREAKFAST AND AT BEDTIME   Continuous Glucose Receiver (DEXCOM G7 RECEIVER) DEVI To display CGM data   Continuous Glucose Sensor (DEXCOM G7 SENSOR) MISC 1 Device by Does not apply route as directed. Change sensor every 10 days   digoxin (LANOXIN) 0.125 MG tablet TAKE ONE TABLET BY MOUTH EVERYDAY AT BEDTIME   furosemide (LASIX) 20 MG tablet TAKE ONE TABLET BY MOUTH EVERY EVENING   glucose blood (ONETOUCH VERIO) test strip USE AS INSTRUCTED TO CHECK BLOOD SUGAR 2 TIMES A DAY.   insulin NPH-regular Human (NOVOLIN 70/30) (70-30) 100 UNIT/ML injection Inject 40 units into THE SKIN twice daily WITH A MEAL   Insulin Syringe-Needle U-100 (INSULIN SYRINGE 1CC/31GX5/16") 31G X 5/16" 1 ML MISC Use 3 needles per day   lithium carbonate 300 MG capsule Take 3 capsules (900 mg total) by mouth daily.   losartan (COZAAR) 50 MG tablet Take 1 tablet (50 mg total) by mouth daily.   omeprazole (PRILOSEC) 20 MG capsule Take 1 capsule (20 mg total) by mouth daily.   QUEtiapine (SEROQUEL) 200 MG tablet Take 600 mg by mouth at bedtime. Per patient taking 500 mg   sildenafil (VIAGRA) 50 MG tablet Take 1 tablet (50 mg total) by mouth daily as needed for erectile dysfunction. Do not take more then once in 24 hours.  tadalafil (CIALIS) 20 MG tablet Take 0.5-1 tablets (10-20 mg total) by mouth every other day as needed for erectile dysfunction.   tirzepatide (MOUNJARO) 10 MG/0.5ML Pen Inject 10 mg into the skin once a week.   No facility-administered encounter medications on file as of 09/01/2022.   BP Readings from Last 3 Encounters:  08/07/22 122/72  05/21/22 (!) 170/80  02/04/22 (!) 140/70    Pulse Readings from Last 3 Encounters:  08/07/22 87  05/21/22 95  02/04/22 73    Lab  Results  Component Value Date/Time   HGBA1C 7.0 (H) 08/01/2022 11:19 AM   HGBA1C 8.2 (H) 05/19/2022 09:50 AM   Lab Results  Component Value Date   CREATININE 1.49 05/19/2022   BUN 18 05/19/2022   GFR 48.71 (L) 05/19/2022   GFRNONAA >60 07/07/2021   GFRAA >90 04/18/2011   NA 138 05/19/2022   K 4.3 05/19/2022   CALCIUM 9.1 05/19/2022   CO2 29 05/19/2022   Matthew Barnes CMA  Clinical Pharmacist Assistant [phone]

## 2022-09-09 ENCOUNTER — Ambulatory Visit: Payer: Medicare HMO | Attending: Internal Medicine | Admitting: Internal Medicine

## 2022-09-09 ENCOUNTER — Encounter: Payer: Self-pay | Admitting: Internal Medicine

## 2022-09-09 VITALS — BP 110/60 | HR 85 | Ht 73.0 in | Wt 290.0 lb

## 2022-09-09 DIAGNOSIS — I5042 Chronic combined systolic (congestive) and diastolic (congestive) heart failure: Secondary | ICD-10-CM

## 2022-09-09 DIAGNOSIS — Z95810 Presence of automatic (implantable) cardiac defibrillator: Secondary | ICD-10-CM

## 2022-09-09 LAB — CUP PACEART INCLINIC DEVICE CHECK
Date Time Interrogation Session: 20240611161324
Implantable Lead Connection Status: 753985
Implantable Lead Implant Date: 20230220
Implantable Lead Location: 753860
Implantable Lead Model: 6935
Implantable Pulse Generator Implant Date: 20230220

## 2022-09-09 NOTE — Progress Notes (Signed)
HPI Matthew Barnes returns today for followup. He is a pleasant 67 yo man with a h/o chronic systolic heart failure, s/p ICD insertion. At implant his R waves were 10 but over time have decreased to below 2 mV. The patient has not received any therapy but has had documented T wave oversensing. No other complaints. Allergies  Allergen Reactions   Influenza Vaccines Swelling and Other (See Comments)    Reports of swelling of the arm and then was "sick" for 10 days     Current Outpatient Medications  Medication Sig Dispense Refill   Accu-Chek Softclix Lancets lancets Check blood sugar 2 times daily 100 each 3   atorvastatin (LIPITOR) 20 MG tablet TAKE ONE TABLET BY MOUTH EVERYDAY AT BEDTIME 90 tablet 1   Blood Glucose Monitoring Suppl (ACCU-CHEK GUIDE) w/Device KIT Check blood sugar 2 times daily 1 kit 0   carvedilol (COREG) 6.25 MG tablet TAKE ONE TABLET BY MOUTH AT BREAKFAST AND AT BEDTIME 180 tablet 1   Continuous Glucose Receiver (DEXCOM G7 RECEIVER) DEVI To display CGM data 1 each 0   Continuous Glucose Sensor (DEXCOM G7 SENSOR) MISC 1 Device by Does not apply route as directed. Change sensor every 10 days 3 each 3   digoxin (LANOXIN) 0.125 MG tablet TAKE ONE TABLET BY MOUTH EVERYDAY AT BEDTIME 30 tablet 0   furosemide (LASIX) 20 MG tablet TAKE ONE TABLET BY MOUTH EVERY EVENING 90 tablet 1   glucose blood (ONETOUCH VERIO) test strip USE AS INSTRUCTED TO CHECK BLOOD SUGAR 2 TIMES A DAY. 200 each 1   insulin NPH-regular Human (NOVOLIN 70/30) (70-30) 100 UNIT/ML injection Inject 40 units into THE SKIN twice daily WITH A MEAL 20 mL 0   Insulin Syringe-Needle U-100 (INSULIN SYRINGE 1CC/31GX5/16") 31G X 5/16" 1 ML MISC Use 3 needles per day 100 each 3   lithium carbonate 300 MG capsule Take 3 capsules (900 mg total) by mouth daily.     losartan (COZAAR) 50 MG tablet Take 1 tablet (50 mg total) by mouth daily. 90 tablet 2   omeprazole (PRILOSEC) 20 MG capsule Take 1 capsule (20 mg total) by  mouth daily. 30 capsule 0   QUEtiapine (SEROQUEL) 200 MG tablet Take 600 mg by mouth at bedtime. Per patient taking 500 mg     sertraline (ZOLOFT) 100 MG tablet Take 100 mg by mouth daily.     sildenafil (VIAGRA) 50 MG tablet Take 1 tablet (50 mg total) by mouth daily as needed for erectile dysfunction. Do not take more then once in 24 hours. 10 tablet 3   tadalafil (CIALIS) 20 MG tablet Take 0.5-1 tablets (10-20 mg total) by mouth every other day as needed for erectile dysfunction. 10 tablet 11   tirzepatide (MOUNJARO) 10 MG/0.5ML Pen Inject 10 mg into the skin once a week. 6 mL 1   No current facility-administered medications for this visit.     Past Medical History:  Diagnosis Date   Bipolar II disorder - Managed by Jacqulyn Ducking [phone]) 05/03/2013   Diabetes mellitus    Dysphagia    Hyperlipidemia    Hypertension    Unspecified hypothyroidism 05/03/2013    ROS:   All systems reviewed and negative except as noted in the HPI.   Past Surgical History:  Procedure Laterality Date   ICD IMPLANT N/A 05/20/2021   Procedure: ICD IMPLANT;  Surgeon: Marinus Maw, MD;  Location: [facility] INVASIVE CV LAB;  Service: Cardiovascular;  Laterality: N/A;  KNEE SURGERY     scope on left knee   RIGHT/LEFT HEART CATH AND CORONARY ANGIOGRAPHY N/A 08/07/2020   Procedure: RIGHT/LEFT HEART CATH AND CORONARY ANGIOGRAPHY;  Surgeon: Marykay Lex, MD;  Location: [facility] INVASIVE CV LAB;  Service: Cardiovascular;  Laterality: N/A;     Family History  Problem Relation Age of Onset   Diabetes Mother    Hypertension Mother    Heart disease Mother    Heart disease Father    Heart attack Father 11   Diabetes Father    Hypertension Father    Depression Father    Diabetes Sister    Depression Paternal Grandmother    Depression Other      Social History   Socioeconomic History   Marital status: Single    Spouse name: Not on file   Number of children: Not on file   Years of education: Not  on file   Highest education level: Not on file  Occupational History   Not on file  Tobacco Use   Smoking status: Never   Smokeless tobacco: Never  Vaping Use   Vaping Use: Never used  Substance and Sexual Activity   Alcohol use: No   Drug use: No   Sexual activity: Not Currently  Other Topics Concern   Not on file  Social History Narrative   Work or School: on disability for knee arthritis      Home Situation: lives alone      Spiritual Beliefs:Christian      Lifestyle:no regular exercising; diet not great            Social Determinants of Health   Financial Resource Strain: Low Risk  (03/10/2022)   Overall Financial Resource Strain (CARDIA)    Difficulty of Paying Living Expenses: Not hard at all  Recent Concern: Financial Resource Strain - Medium Risk (01/24/2022)   Overall Financial Resource Strain (CARDIA)    Difficulty of Paying Living Expenses: Somewhat hard  Food Insecurity: No Food Insecurity (06/10/2022)   Hunger Vital Sign    Worried About Running Out of Food in the Last Year: Never true    Ran Out of Food in the Last Year: Never true  Transportation Needs: No Transportation Needs (03/10/2022)   PRAPARE - Administrator, Civil Service (Medical): No    Lack of Transportation (Non-Medical): No  Physical Activity: Insufficiently Active (03/10/2022)   Exercise Vital Sign    Days of Exercise per Week: 4 days    Minutes of Exercise per Session: 30 min  Stress: No Stress Concern Present (03/10/2022)   [employer] of Occupational Health - Occupational Stress Questionnaire    Feeling of Stress : Not at all  Social Connections: Socially Isolated (03/10/2022)   Social Connection and Isolation Panel [NHANES]    Frequency of Communication with Friends and Family: More than three times a week    Frequency of Social Gatherings with Friends and Family: More than three times a week    Attends Religious Services: Never    Database administrator or  Organizations: No    Attends Banker Meetings: Never    Marital Status: Never married  Intimate Partner Violence: Not At Risk (03/10/2022)   Humiliation, Afraid, Rape, and Kick questionnaire    Fear of Current or Ex-Partner: No    Emotionally Abused: No    Physically Abused: No    Sexually Abused: No     BP 110/60   Pulse 85  Ht 6\' 1"  (1.854 m)   Wt 290 lb (131.5 kg)   SpO2 95%   BMI 38.26 kg/m   Physical Exam:  Well appearing NAD HEENT: Unremarkable Neck:  No JVD, no thyromegally Lymphatics:  No adenopathy Back:  No CVA tenderness Lungs:  Clear with no wheezes HEART:  Regular rate rhythm, no murmurs, no rubs, no clicks Abd:  soft, positive bowel sounds, no organomegally, no rebound, no guarding Ext:  2 plus pulses, no edema, no cyanosis, no clubbing Skin:  No rashes no nodules Neuro:  CN II through XII intact, motor grossly intact   DEVICE  Normal device function.  See PaceArt for details.  His sensing vector was changed to intergrated and the R waves went from 2 to 15. He has been reprogrammed to a sensitivity of 0.76mV.   Assess/Plan: T wave oversensing/decreased R waves - we have reprogrammed the device to intergrated and reduced the sensitivity. We will follow. No indication to change out the device. HTN - his bp is well controlled. Chronic systolic heart failure - his symptoms are class 2. We will follow.  Sharlot Gowda Miel Wisener,MD

## 2022-09-09 NOTE — Patient Instructions (Signed)
Medication Instructions:  Your physician recommends that you continue on your current medications as directed. Please refer to the Current Medication list given to you today.  *If you need a refill on your cardiac medications before your next appointment, please call your pharmacy*  Lab Work: None ordered.  If you have labs (blood work) drawn today and your tests are completely normal, you will receive your results only by: MyChart Message (if you have MyChart) OR A paper copy in the mail If you have any lab test that is abnormal or we need to change your treatment, we will call you to review the results.  Testing/Procedures: None ordered.  Follow-Up: At [facility], you and your health needs are our priority.  As part of our continuing mission to provide you with exceptional heart care, we have created designated Provider Care Teams.  These Care Teams include your primary Cardiologist (physician) and Advanced Practice Providers (APPs -  Physician Assistants and Nurse Practitioners) who all work together to provide you with the care you need, when you need it.  Your next appointment:   1 year(s)  The format for your next appointment:   In Person  Provider:   Lewayne Bunting, MD{or one of the following Advanced Practice Providers on your designated Care Team:   Francis Dowse, [place] [name] "Mardelle Matte" Lanna Poche, [place]  Remote monitoring is used to monitor your ICD from home. This monitoring reduces the number of office visits required to check your device to one time per year. It allows [place] to keep an eye on the functioning of your device to ensure it is working properly. You are scheduled for a device check from home on 8/27. You may send your transmission at any time that day. If you have a wireless device, the transmission will be sent automatically. After your physician reviews your transmission, you will receive a postcard with your next transmission date.

## 2022-09-18 NOTE — Progress Notes (Signed)
Remote ICD transmission.   

## 2022-09-25 DIAGNOSIS — F3132 Bipolar disorder, current episode depressed, moderate: Secondary | ICD-10-CM | POA: Diagnosis not present

## 2022-09-29 ENCOUNTER — Other Ambulatory Visit: Payer: Self-pay | Admitting: Endocrinology

## 2022-09-29 ENCOUNTER — Other Ambulatory Visit: Payer: Self-pay | Admitting: Nurse Practitioner

## 2022-09-29 ENCOUNTER — Other Ambulatory Visit: Payer: Self-pay | Admitting: Family Medicine

## 2022-09-29 DIAGNOSIS — Z794 Long term (current) use of insulin: Secondary | ICD-10-CM

## 2022-09-29 DIAGNOSIS — K219 Gastro-esophageal reflux disease without esophagitis: Secondary | ICD-10-CM

## 2022-09-29 DIAGNOSIS — I5023 Acute on chronic systolic (congestive) heart failure: Secondary | ICD-10-CM

## 2022-10-08 ENCOUNTER — Ambulatory Visit (INDEPENDENT_AMBULATORY_CARE_PROVIDER_SITE_OTHER): Payer: Medicare HMO | Admitting: Family Medicine

## 2022-10-08 ENCOUNTER — Encounter: Payer: Self-pay | Admitting: Family Medicine

## 2022-10-08 ENCOUNTER — Other Ambulatory Visit: Payer: Self-pay

## 2022-10-08 VITALS — BP 124/66 | HR 85 | Temp 98.1°F | Ht 73.0 in | Wt 282.2 lb

## 2022-10-08 DIAGNOSIS — H81391 Other peripheral vertigo, right ear: Secondary | ICD-10-CM

## 2022-10-08 DIAGNOSIS — E1165 Type 2 diabetes mellitus with hyperglycemia: Secondary | ICD-10-CM

## 2022-10-08 MED ORDER — ACCU-CHEK SOFTCLIX LANCETS MISC
3 refills | Status: AC
Start: 2022-10-08 — End: ?

## 2022-10-08 MED ORDER — ACCU-CHEK GUIDE W/DEVICE KIT
PACK | 0 refills | Status: DC
Start: 2022-10-08 — End: 2022-10-14

## 2022-10-08 NOTE — Patient Instructions (Signed)
Try the Epley Manuevers, as discussed.

## 2022-10-08 NOTE — Progress Notes (Signed)
Established Patient Office Visit  Subjective   Patient ID: Matthew Barnes, male    DOB: 1955-11-29  Age: 67 y.o. MRN: [id number]  Chief Complaint  Patient presents with   Dizziness    Patient complains of dizziness, x2 weeks    HPI   [name] seen as a work in with almost 2-week history of some intermittent dizziness.  This sounds like vertigo.  Symptoms come and go.  No associated nausea or vomiting.  No hearing changes.  No focal weakness.  No speech changes.  He had similar symptoms at times in the past.  He bought some over-the-counter meclizine but has not tried this yet.  No recent head injury.  No headaches.  He has chronic problems including type 2 diabetes, dilated cardiomyopathy, hypothyroidism, chronic kidney disease.  Is on Mounjaro for his diabetes and has been losing some weight related to that.  Past Medical History:  Diagnosis Date   Bipolar II disorder - Managed by Jacqulyn Ducking [phone]) 05/03/2013   Diabetes mellitus    Dysphagia    Hyperlipidemia    Hypertension    Unspecified hypothyroidism 05/03/2013   Past Surgical History:  Procedure Laterality Date   ICD IMPLANT N/A 05/20/2021   Procedure: ICD IMPLANT;  Surgeon: Marinus Maw, MD;  Location[facility] INVASIVE CV LAB;  Service: Cardiovascular;  Laterality: N/A;   KNEE SURGERY     scope on left knee   RIGHT/LEFT HEART CATH AND CORONARY ANGIOGRAPHY N/A 08/07/2020   Procedure: RIGHT/LEFT HEART CATH AND CORONARY ANGIOGRAPHY;  Surgeon: Marykay Lex, MD;  Location: [facility] INVASIVE CV LAB;  Service: Cardiovascular;  Laterality: N/A;    reports that he has never smoked. He has never used smokeless tobacco. He reports that he does not drink alcohol and does not use drugs. family history includes Depression in his father, paternal grandmother, and another family member; Diabetes in his father, mother, and sister; Heart attack (age of onset: 32) in his father; Heart disease in his father and mother; Hypertension in  his father and mother. Allergies  Allergen Reactions   Influenza Vaccines Swelling and Other (See Comments)    Reports of swelling of the arm and then was "sick" for 10 days    Review of Systems  Constitutional:  Negative for chills and fever.  HENT:  Negative for hearing loss.   Cardiovascular:  Negative for chest pain.  Neurological:  Positive for dizziness. Negative for speech change, focal weakness, seizures, loss of consciousness, weakness and headaches.      Objective:     BP 124/66 (BP Location: Left Arm, Patient Position: Sitting, Cuff Size: Large)   Pulse 85   Temp 98.1 F (36.7 C) (Oral)   Ht 6\' 1"  (1.854 m)   Wt 282 lb 3.2 oz (128 kg)   SpO2 98%   BMI 37.23 kg/m  BP Readings from Last 3 Encounters:  10/08/22 124/66  09/09/22 110/60  08/07/22 122/72   Wt Readings from Last 3 Encounters:  10/08/22 282 lb 3.2 oz (128 kg)  09/09/22 290 lb (131.5 kg)  08/07/22 296 lb 6.4 oz (134.4 kg)      Physical Exam Vitals reviewed.  Constitutional:      Appearance: Normal appearance.  Cardiovascular:     Rate and Rhythm: Normal rate and regular rhythm.  Pulmonary:     Effort: Pulmonary effort is normal.     Breath sounds: Normal breath sounds. No wheezing or rales.  Neurological:     General: No  focal deficit present.     Mental Status: He is alert and oriented to person, place, and time.     Cranial Nerves: No cranial nerve deficit.     Motor: No weakness.     Coordination: Coordination normal.     Gait: Gait normal.     Comments: Patient has reproducible vertigo symptoms when going from sitting to lying supine with head turn 45 degrees to the right but not the left  Psychiatric:        Mood and Affect: Mood normal.      No results found for any visits on 10/08/22.  Last CBC Lab Results  Component Value Date   WBC 6.2 07/07/2021   HGB 12.6 (L) 07/07/2021   HCT 39.3 07/07/2021   MCV 82.2 07/07/2021   MCH 26.4 07/07/2021   RDW 14.4 07/07/2021   PLT  132 (L) 07/07/2021   Last metabolic panel Lab Results  Component Value Date   GLUCOSE 153 (H) 08/01/2022   NA 138 05/19/2022   K 4.3 05/19/2022   CL 101 05/19/2022   CO2 29 05/19/2022   BUN 18 05/19/2022   CREATININE 1.49 05/19/2022   GFR 48.71 (L) 05/19/2022   CALCIUM 9.1 05/19/2022   PHOS 4.2 08/05/2020   PROT 6.9 10/28/2021   ALBUMIN 4.2 10/28/2021   BILITOT 0.6 10/28/2021   ALKPHOS 53 10/28/2021   AST 21 10/28/2021   ALT 17 10/28/2021   ANIONGAP 8 07/07/2021   Last lipids Lab Results  Component Value Date   CHOL 91 10/28/2021   HDL 23.50 (L) 10/28/2021   LDLCALC 38 10/28/2021   LDLDIRECT 73.0 09/01/2017   TRIG 144.0 10/28/2021   CHOLHDL 4 10/28/2021   Last hemoglobin A1c Lab Results  Component Value Date   HGBA1C 7.0 (H) 08/01/2022   Last thyroid functions Lab Results  Component Value Date   TSH 1.90 02/04/2022      The ASCVD Risk score (Arnett DK, et al., 2019) failed to calculate for the following reasons:   The valid total cholesterol range is 130 to 320 mg/dL    Assessment & Plan:   Patient is seen with vertigo symptoms which are intermittent.  These are triggered on exam today with head turned 45 degrees to the right seated to supine.  He had some associated mild horizontal nystagmus.  Otherwise nonfocal neuroexam.  -We recommended Epley maneuvers with handout given -He bought some meclizine over-the-counter which she will try but we have recommended specially trying the Epley maneuver's -Caution to reduce falls -Touch base if symptoms not resolving with Epley maneuvers over the next couple of days.  Consider vestibular rehab if not improving with home maneuvers  Evelena Peat, MD

## 2022-10-14 ENCOUNTER — Other Ambulatory Visit: Payer: Self-pay | Admitting: Endocrinology

## 2022-10-14 ENCOUNTER — Telehealth: Payer: Self-pay

## 2022-10-14 DIAGNOSIS — E1165 Type 2 diabetes mellitus with hyperglycemia: Secondary | ICD-10-CM

## 2022-10-14 MED ORDER — ACCU-CHEK GUIDE W/DEVICE KIT
PACK | 0 refills | Status: AC
Start: 2022-10-14 — End: ?

## 2022-10-14 NOTE — Telephone Encounter (Signed)
 Matthew Barnes, CMA  ?

## 2022-10-28 ENCOUNTER — Encounter: Payer: Self-pay | Admitting: Family Medicine

## 2022-10-28 ENCOUNTER — Other Ambulatory Visit (INDEPENDENT_AMBULATORY_CARE_PROVIDER_SITE_OTHER): Payer: Medicare HMO

## 2022-10-28 ENCOUNTER — Ambulatory Visit (INDEPENDENT_AMBULATORY_CARE_PROVIDER_SITE_OTHER): Payer: Medicare HMO | Admitting: Family Medicine

## 2022-10-28 VITALS — BP 120/70 | HR 77 | Temp 98.2°F | Ht 73.0 in | Wt 282.8 lb

## 2022-10-28 DIAGNOSIS — E1165 Type 2 diabetes mellitus with hyperglycemia: Secondary | ICD-10-CM

## 2022-10-28 DIAGNOSIS — K219 Gastro-esophageal reflux disease without esophagitis: Secondary | ICD-10-CM | POA: Diagnosis not present

## 2022-10-28 DIAGNOSIS — E7800 Pure hypercholesterolemia, unspecified: Secondary | ICD-10-CM

## 2022-10-28 DIAGNOSIS — I10 Essential (primary) hypertension: Secondary | ICD-10-CM | POA: Diagnosis not present

## 2022-10-28 DIAGNOSIS — Z794 Long term (current) use of insulin: Secondary | ICD-10-CM

## 2022-10-28 DIAGNOSIS — I5023 Acute on chronic systolic (congestive) heart failure: Secondary | ICD-10-CM

## 2022-10-28 DIAGNOSIS — R42 Dizziness and giddiness: Secondary | ICD-10-CM

## 2022-10-28 DIAGNOSIS — I5022 Chronic systolic (congestive) heart failure: Secondary | ICD-10-CM | POA: Diagnosis not present

## 2022-10-28 LAB — BASIC METABOLIC PANEL
BUN: 13 mg/dL (ref 6–23)
CO2: 31 mEq/L (ref 19–32)
Calcium: 9.2 mg/dL (ref 8.4–10.5)
Chloride: 102 mEq/L (ref 96–112)
Creatinine, Ser: 1.5 mg/dL (ref 0.40–1.50)
GFR: 48.17 mL/min — ABNORMAL LOW (ref >60.00)
Glucose, Bld: 86 mg/dL (ref 70–99)
Potassium: 4.2 mEq/L (ref 3.5–5.1)
Sodium: 139 mEq/L (ref 135–145)

## 2022-10-28 LAB — HEMOGLOBIN A1C: Hgb A1c MFr Bld: 6.2 % (ref 4.6–6.5)

## 2022-10-28 MED ORDER — DIGOXIN 125 MCG PO TABS
0.1250 mg | ORAL_TABLET | Freq: Every day | ORAL | 3 refills | Status: DC
Start: 2022-10-28 — End: 2023-08-17
  Filled 2022-12-30: qty 30, 30d supply, fill #0
  Filled 2022-12-30: qty 90, 90d supply, fill #0
  Filled 2023-01-28: qty 30, 30d supply, fill #1
  Filled 2023-02-18 – 2023-02-20 (×3): qty 30, 30d supply, fill #2
  Filled 2023-03-18 – 2023-03-19 (×3): qty 30, 30d supply, fill #3
  Filled 2023-04-06 – 2023-04-22 (×2): qty 30, 30d supply, fill #4
  Filled 2023-05-11 – 2023-05-18 (×2): qty 30, 30d supply, fill #5
  Filled 2023-06-04 – 2023-06-11 (×2): qty 30, 30d supply, fill #6
  Filled 2023-06-19 – 2023-07-07 (×2): qty 30, 30d supply, fill #7
  Filled 2023-07-21 – 2023-07-30 (×2): qty 30, 30d supply, fill #8
  Filled ????-??-??: fill #4
  Filled ????-??-??: fill #3

## 2022-10-28 MED ORDER — FUROSEMIDE 20 MG PO TABS
20.0000 mg | ORAL_TABLET | Freq: Every evening | ORAL | 1 refills | Status: DC
Start: 2022-10-28 — End: 2023-05-25

## 2022-10-28 MED ORDER — MECLIZINE HCL 25 MG PO TABS
12.5000 mg | ORAL_TABLET | Freq: Three times a day (TID) | ORAL | 2 refills | Status: DC | PRN
Start: 2022-10-28 — End: 2023-06-10
  Filled 2022-12-30 – 2023-03-19 (×4): qty 30, 10d supply, fill #0
  Filled 2023-05-17: qty 30, 10d supply, fill #1

## 2022-10-28 MED ORDER — LOSARTAN POTASSIUM 50 MG PO TABS
50.0000 mg | ORAL_TABLET | Freq: Every day | ORAL | 1 refills | Status: DC
Start: 2022-10-28 — End: 2023-05-25

## 2022-10-28 MED ORDER — ATORVASTATIN CALCIUM 20 MG PO TABS
20.0000 mg | ORAL_TABLET | Freq: Every day | ORAL | 1 refills | Status: DC
Start: 2022-10-28 — End: 2023-05-25
  Filled 2022-12-30: qty 30, 30d supply, fill #0
  Filled 2022-12-30: qty 90, 90d supply, fill #0
  Filled 2023-01-28: qty 30, 30d supply, fill #1
  Filled 2023-02-18 – 2023-02-20 (×3): qty 30, 30d supply, fill #2
  Filled 2023-03-18 – 2023-03-19 (×3): qty 30, 30d supply, fill #3
  Filled 2023-04-06 – 2023-04-22 (×2): qty 30, 30d supply, fill #4
  Filled 2023-05-11 – 2023-05-18 (×2): qty 30, 30d supply, fill #5
  Filled ????-??-??: fill #3
  Filled ????-??-??: fill #4

## 2022-10-28 MED ORDER — OMEPRAZOLE 20 MG PO CPDR
20.0000 mg | DELAYED_RELEASE_CAPSULE | Freq: Every day | ORAL | 1 refills | Status: DC
Start: 2022-10-28 — End: 2023-05-25
  Filled 2022-12-30: qty 30, 30d supply, fill #0
  Filled 2022-12-30: qty 90, 90d supply, fill #0
  Filled 2023-01-28: qty 30, 30d supply, fill #1
  Filled 2023-02-18 – 2023-02-20 (×3): qty 30, 30d supply, fill #2
  Filled 2023-03-18 – 2023-03-19 (×3): qty 30, 30d supply, fill #3
  Filled 2023-04-06 – 2023-04-22 (×2): qty 30, 30d supply, fill #4
  Filled 2023-05-11 – 2023-05-18 (×2): qty 30, 30d supply, fill #5
  Filled ????-??-??: fill #3
  Filled ????-??-??: fill #4

## 2022-10-28 NOTE — Progress Notes (Signed)
Established Patient Office Visit  Subjective   Patient ID: HOMER CHRISTOPHER, male    DOB: [birth year]  Age: 67 y.o. MRN: [id number]  Chief Complaint  Patient presents with   Dizziness    Patient complains of recurrent dizziness since last visit with Dr Caryl Never a few weeks ago, states Meclizine helps some    Patient is reporting recurrent dizziness. States this has happened to him in the past (see my previous note from July 2023). Is taking meclizine PRN which is helping. States that it happens when he stands up or sits down. Does not feel like he is going to pass out, denies any room spinning.   DM-- pt reports he is doing well on the mounjaro, states he is losing weight and feels good. Needs labs done today. No new symptoms or issues to report. Is checking his sugar regularly with the CGM. Still using his novolin 70/30 but reports his dose is decreasing. Taking statin daily. Needs refills today.   HTN -- BP in office performed and is well controlled. He  reports no side effects to the medications, no chest pain, SOB, dizziness or headaches. He has a BP cuff at home and is checking BP regularly, reports they are in the normal range.   Chronic CHF - pt reports no swelling or SOB today. Continues on his medications as prescribed. Needs refills.    Dizziness    Current Outpatient Medications  Medication Instructions   Accu-Chek Softclix Lancets lancets Check blood sugar 2 times daily   atorvastatin (LIPITOR) 20 mg, Oral, Daily   Blood Glucose Monitoring Suppl (ACCU-CHEK GUIDE) w/Device KIT Check blood sugar 2 times daily   carvedilol (COREG) 6.25 MG tablet TAKE ONE TABLET BY MOUTH AT BREAKFAST AND AT BEDTIME   Continuous Glucose Receiver (DEXCOM G7 RECEIVER) DEVI To display CGM data   Continuous Glucose Sensor (DEXCOM G7 SENSOR) MISC 1 Device, Does not apply, As directed, Change sensor every 10 days   digoxin (LANOXIN) 0.125 MG tablet TAKE ONE TABLET BY MOUTH EVERYDAY AT BEDTIME    furosemide (LASIX) 20 mg, Oral, Every evening   glucose blood (ONETOUCH VERIO) test strip USE AS INSTRUCTED TO CHECK BLOOD SUGAR 2 TIMES A DAY.   Insulin Syringe-Needle U-100 (INSULIN SYRINGE 1CC/31GX5/16") 31G X 5/16" 1 ML MISC Use 3 needles per day   losartan (COZAAR) 50 mg, Oral, Daily   meclizine (ANTIVERT) 12.5-25 mg, Oral, 3 times daily PRN   NOVOLIN 70/30 KWIKPEN (70-30) 100 UNIT/ML KwikPen Inject 40 units into THE SKIN twice daily with A meal   omeprazole (PRILOSEC) 20 mg, Oral, Daily   QUEtiapine (SEROQUEL) 600 mg, Oral, Daily at bedtime, Per patient taking 500 mg   sertraline (ZOLOFT) 100 mg, Oral, Daily   tadalafil (CIALIS) 10-20 mg, Oral, Every 48 hours PRN   tirzepatide (MOUNJARO) 10 mg, Subcutaneous, Weekly    Patient Active Problem List   Diagnosis Date Noted   Dizziness 11/02/2022   ICD (implantable cardioverter-defibrillator) in place 09/11/2021   Morbid obesity (HCC) 04/12/2021   Other secondary pulmonary hypertension (HCC) 04/12/2021   Chronic kidney disease, stage 3b (HCC) 04/12/2021   Dilated cardiomyopathy (HCC)    Elevated troponin level not due to acute coronary syndrome    Chronic systolic CHF (congestive heart failure) (HCC) 08/06/2020   AMS (altered mental status) 07/31/2020   Aphasia 07/31/2020   Difficulty with speech 07/30/2020   Chest pain 03/26/2018   Dyspnea on exertion 03/26/2018   Family history of heart disease 03/26/2018  Leg edema 10/27/2016   Bipolar affective, mixed (HCC) 04/29/2016   Esophageal reflux 01/17/2014   Type 2 diabetes mellitus with diabetic neuropathy, with long-term current use of insulin (HCC) 08/31/2013   Primary hypertension 05/03/2013   Hyperlipidemia 05/03/2013   Hypothyroidism 05/03/2013      Review of Systems  Neurological:  Positive for dizziness.  All other systems reviewed and are negative.     Objective:     BP 120/70 (BP Location: Left Arm, Patient Position: Sitting, Cuff Size: Large)   Pulse 77    Temp 98.2 F (36.8 C) (Oral)   Ht 6\' 1"  (1.854 m)   Wt 282 lb 12.8 oz (128.3 kg)   SpO2 97%   BMI 37.31 kg/m    Physical Exam Vitals reviewed.  Constitutional:      Appearance: Normal appearance. He is obese.  Cardiovascular:     Rate and Rhythm: Normal rate and regular rhythm.  Pulmonary:     Effort: Pulmonary effort is normal.     Breath sounds: Normal breath sounds. No wheezing or rales.  Abdominal:     General: Bowel sounds are normal.  Neurological:     General: No focal deficit present.     Mental Status: He is alert and oriented to person, place, and time.  Psychiatric:        Mood and Affect: Mood normal.        Behavior: Behavior normal.          The ASCVD Risk score (Arnett DK, et al., 2019) failed to calculate for the following reasons:   The valid total cholesterol range is 130 to 320 mg/dL    Assessment & Plan:  Dizziness Assessment & Plan: Chronic, recurrent, meclizine is working well to control his symptoms. Will refill this for him today,   Orders: -     Meclizine HCl; Take 0.5-1 tablets (12.5-25 mg total) by mouth 3 (three) times daily as needed for dizziness.  Dispense: 30 tablet; Refill: 2  Chronic systolic CHF (congestive heart failure) (HCC) Assessment & Plan: EF from last year remains at 25%, will continue the furosemide and digoxin. Symptoms are controlled at this time.   Orders: -     Digoxin; TAKE ONE TABLET BY MOUTH EVERYDAY AT BEDTIME  Dispense: 90 tablet; Refill: 3 -     Furosemide; Take 1 tablet (20 mg total) by mouth every evening.  Dispense: 90 tablet; Refill: 1  Pure hypercholesterolemia -     Atorvastatin Calcium; Take 1 tablet (20 mg total) by mouth daily.  Dispense: 90 tablet; Refill: 1  Primary hypertension Assessment & Plan: Current hypertension medications:       Sig   carvedilol (COREG) 6.25 MG tablet (Taking) TAKE ONE TABLET BY MOUTH AT BREAKFAST AND AT BEDTIME   tadalafil (CIALIS) 20 MG tablet (Taking) Take 0.5-1  tablets (10-20 mg total) by mouth every other day as needed for erectile dysfunction.   furosemide (LASIX) 20 MG tablet Take 1 tablet (20 mg total) by mouth every evening.   losartan (COZAAR) 50 MG tablet Take 1 tablet (50 mg total) by mouth daily.      BP well controlled today, will refill his losartan. Continued the above medications as prescribed.   Orders: -     Losartan Potassium; Take 1 tablet (50 mg total) by mouth daily.  Dispense: 90 tablet; Refill: 1  Gastroesophageal reflux disease, unspecified whether esophagitis present Assessment & Plan: Symptoms controlled per patient, requesting refills, rx sent  Orders: -  Omeprazole; Take 1 capsule (20 mg total) by mouth daily.  Dispense: 90 capsule; Refill: 1     Return in about 6 months (around 04/30/2023) for DM.    Karie Georges, MD

## 2022-10-30 ENCOUNTER — Encounter: Payer: Self-pay | Admitting: Family Medicine

## 2022-10-30 NOTE — Telephone Encounter (Signed)
Abstracted

## 2022-10-30 NOTE — Telephone Encounter (Signed)
-----   Message from [place] M sent at 10/29/2022  7:50 AM EDT ----- Please abstract and route to provider.

## 2022-10-31 ENCOUNTER — Ambulatory Visit: Payer: Medicare HMO | Admitting: Endocrinology

## 2022-11-02 DIAGNOSIS — R42 Dizziness and giddiness: Secondary | ICD-10-CM | POA: Insufficient documentation

## 2022-11-02 NOTE — Assessment & Plan Note (Signed)
Symptoms controlled per patient, requesting refills, rx sent

## 2022-11-02 NOTE — Assessment & Plan Note (Signed)
Current hypertension medications:       Sig   carvedilol (COREG) 6.25 MG tablet (Taking) TAKE ONE TABLET BY MOUTH AT BREAKFAST AND AT BEDTIME   tadalafil (CIALIS) 20 MG tablet (Taking) Take 0.5-1 tablets (10-20 mg total) by mouth every other day as needed for erectile dysfunction.   furosemide (LASIX) 20 MG tablet Take 1 tablet (20 mg total) by mouth every evening.   losartan (COZAAR) 50 MG tablet Take 1 tablet (50 mg total) by mouth daily.      BP well controlled today, will refill his losartan. Continued the above medications as prescribed.

## 2022-11-02 NOTE — Assessment & Plan Note (Signed)
Chronic, recurrent, meclizine is working well to control his symptoms. Will refill this for him today,

## 2022-11-02 NOTE — Assessment & Plan Note (Signed)
EF from last year remains at 25%, will continue the furosemide and digoxin. Symptoms are controlled at this time.

## 2022-11-07 ENCOUNTER — Encounter: Payer: Self-pay | Admitting: Endocrinology

## 2022-11-07 ENCOUNTER — Ambulatory Visit: Payer: Medicare HMO | Admitting: Endocrinology

## 2022-11-07 DIAGNOSIS — E1165 Type 2 diabetes mellitus with hyperglycemia: Secondary | ICD-10-CM

## 2022-11-07 DIAGNOSIS — Z794 Long term (current) use of insulin: Secondary | ICD-10-CM

## 2022-11-07 LAB — LIPID PANEL
Cholesterol: 96 mg/dL (ref 0–200)
HDL: 22 mg/dL — ABNORMAL LOW (ref >39.00)
NonHDL: 74.44
Total CHOL/HDL Ratio: 4
Triglycerides: 257 mg/dL — ABNORMAL HIGH (ref 0.0–149.0)
VLDL: 51.4 mg/dL — ABNORMAL HIGH (ref 0.0–40.0)

## 2022-11-07 LAB — LDL CHOLESTEROL, DIRECT: Direct LDL: 53 mg/dL

## 2022-11-07 MED ORDER — NOVOLIN 70/30 FLEXPEN (70-30) 100 UNIT/ML ~~LOC~~ SUPN
PEN_INJECTOR | SUBCUTANEOUS | 5 refills | Status: DC
Start: 2022-11-07 — End: 2022-12-03

## 2022-11-07 MED ORDER — TIRZEPATIDE 12.5 MG/0.5ML ~~LOC~~ SOAJ: 12.5000 mg | SUBCUTANEOUS | 3 refills | Status: DC

## 2022-11-07 NOTE — Progress Notes (Signed)
Outpatient Endocrinology Note Matthew Barnes , MD  11/07/22  Patient's Name: Matthew Barnes    DOB: 1956-01-24    MRN: [id number]                                                    REASON OF VISIT: Follow up of type 2 diabetes mellitus  PCP: Matthew Georges, MD  HISTORY OF PRESENT ILLNESS:   Matthew Barnes is a 67 y.o. old male with past medical history listed below, is here for follow up  type 2 diabetes mellitus.   Pertinent Diabetes History: He was diagnosed with type 2 diabetes mellitus in 2011.  He was initially managed with metformin and later added on insulin therapy due to poor control he was on Lantus until 2015.  Around 2015 he was sent to Humalog mix insulin along with Victoza to help with weight loss.  Chronic Diabetes Complications : Retinopathy: no. Last ophthalmology exam was done on annually. Nephropathy: no, on losartan. Peripheral neuropathy: no Coronary artery disease: no Stroke: no  Relevant comorbidities and cardiovascular risk factors: Obesity: yes Body mass index is 36.98 kg/m.  Hypertension: yes Hyperlipidemia. Yes, on statin  Current / Home Diabetic regimen includes: Mounjaro 10 mg weekly. Novolin 70/30 : 50 units daily in the morning.  Matthew Barnes was started around March/April 2024.  Prior diabetic medications: Lantus, Levemir, Victoza, metformin in the past.  Glycemic data:  Not able to download the glucometer in the clinic today.  Reviewed blood sugar numbers from the meter directly some of the blood sugar numbers as follows, mostly acceptable. 202, 130, 210,163, 142,150, 144, 114, 105, 140, 130, 119, 126,120.   Freestyle libre/CGM/Dexcom was prescribed in the past patient reports not covered, not able to get it.  Hypoglycemia: Patient has no hypoglycemic episodes. Patient has hypoglycemia awareness.  Factors modifying glucose control: 1.  Diabetic diet assessment: Has been eating healthy, avoiding high carbohydrate meals.  2.  Staying active  or exercising: Regular exercise/biking.  3.  Medication compliance: compliant all of the time.  Interval history 11/07/22 Patient lost about 30 pounds after being on Mounjaro.  He has reduced insulin, no longer taking evening dose of Novolin for about 2 weeks.  He has reduced morning dose of Novolin to 50 units with breakfast daily.  Overall feeling good.  Hemoglobin A1c recently 6.2% improved.  Denies hypoglycemia.  REVIEW OF SYSTEMS As per history of present illness.   PAST MEDICAL HISTORY: Past Medical History:  Diagnosis Date   Bipolar II disorder - Managed by Matthew Barnes [phone]) 05/03/2013   Diabetes mellitus    Dysphagia    Hyperlipidemia    Hypertension    Unspecified hypothyroidism 05/03/2013    PAST SURGICAL HISTORY: Past Surgical History:  Procedure Laterality Date   ICD IMPLANT N/A 05/20/2021   Procedure: ICD IMPLANT;  Surgeon: Marinus Maw, MD;  Location: [facility] INVASIVE CV LAB;  Service: Cardiovascular;  Laterality: N/A;   KNEE SURGERY     scope on left knee   RIGHT/LEFT HEART CATH AND CORONARY ANGIOGRAPHY N/A 08/07/2020   Procedure: RIGHT/LEFT HEART CATH AND CORONARY ANGIOGRAPHY;  Surgeon: Marykay Lex, MD;  Location: [facility] INVASIVE CV LAB;  Service: Cardiovascular;  Laterality: N/A;    ALLERGIES: Allergies  Allergen Reactions   Influenza Vaccines Swelling and Other (See Comments)  Reports of swelling of the arm and then was "sick" for 10 days    FAMILY HISTORY:  Family History  Problem Relation Age of Onset   Diabetes Mother    Hypertension Mother    Heart disease Mother    Heart disease Father    Heart attack Father 15   Diabetes Father    Hypertension Father    Depression Father    Diabetes Sister    Depression Paternal Grandmother    Depression Other     SOCIAL HISTORY: Social History   Socioeconomic History   Marital status: Single    Spouse name: Not on file   Number of children: Not on file   Years of education: Not on file    Highest education level: Not on file  Occupational History   Not on file  Tobacco Use   Smoking status: Never   Smokeless tobacco: Never  Vaping Use   Vaping status: Never Used  Substance and Sexual Activity   Alcohol use: No   Drug use: No   Sexual activity: Not Currently  Other Topics Concern   Not on file  Social History Narrative   Work or School: on disability for knee arthritis      Home Situation: lives alone      Spiritual Beliefs:Christian      Lifestyle:no regular exercising; diet not great            Social Determinants of Health   Financial Resource Strain: Low Risk  (03/10/2022)   Overall Financial Resource Strain (CARDIA)    Difficulty of Paying Living Expenses: Not hard at all  Recent Concern: Financial Resource Strain - Medium Risk (01/24/2022)   Overall Financial Resource Strain (CARDIA)    Difficulty of Paying Living Expenses: Somewhat hard  Food Insecurity: No Food Insecurity (06/10/2022)   Hunger Vital Sign    Worried About Running Out of Food in the Last Year: Never true    Ran Out of Food in the Last Year: Never true  Transportation Needs: No Transportation Needs (03/10/2022)   PRAPARE - Administrator, Civil Service (Medical): No    Lack of Transportation (Non-Medical): No  Physical Activity: Insufficiently Active (03/10/2022)   Exercise Vital Sign    Days of Exercise per Week: 4 days    Minutes of Exercise per Session: 30 min  Stress: No Stress Concern Present (03/10/2022)   [employer] of Occupational Health - Occupational Stress Questionnaire    Feeling of Stress : Not at all  Social Connections: Socially Isolated (03/10/2022)   Social Connection and Isolation Panel [NHANES]    Frequency of Communication with Friends and Family: More than three times a week    Frequency of Social Gatherings with Friends and Family: More than three times a week    Attends Religious Services: Never    Database administrator or  Organizations: No    Attends Engineer, structural: Never    Marital Status: Never married    MEDICATIONS:  Current Outpatient Medications  Medication Sig Dispense Refill   Accu-Chek Softclix Lancets lancets Check blood sugar 2 times daily 100 each 3   atorvastatin (LIPITOR) 20 MG tablet Take 1 tablet (20 mg total) by mouth daily. 90 tablet 1   Blood Glucose Monitoring Suppl (ACCU-CHEK GUIDE) w/Device KIT Check blood sugar 2 times daily 1 kit 0   carvedilol (COREG) 6.25 MG tablet TAKE ONE TABLET BY MOUTH AT BREAKFAST AND AT BEDTIME 180 tablet 1  digoxin (LANOXIN) 0.125 MG tablet TAKE ONE TABLET BY MOUTH EVERYDAY AT BEDTIME 90 tablet 3   furosemide (LASIX) 20 MG tablet Take 1 tablet (20 mg total) by mouth every evening. 90 tablet 1   glucose blood (ONETOUCH VERIO) test strip USE AS INSTRUCTED TO CHECK BLOOD SUGAR 2 TIMES A DAY. 200 each 1   Insulin Syringe-Needle U-100 (INSULIN SYRINGE 1CC/31GX5/16") 31G X 5/16" 1 ML MISC Use 3 needles per day 100 each 3   losartan (COZAAR) 50 MG tablet Take 1 tablet (50 mg total) by mouth daily. 90 tablet 1   meclizine (ANTIVERT) 25 MG tablet Take 0.5-1 tablets (12.5-25 mg total) by mouth 3 (three) times daily as needed for dizziness. 30 tablet 2   omeprazole (PRILOSEC) 20 MG capsule Take 1 capsule (20 mg total) by mouth daily. 90 capsule 1   QUEtiapine (SEROQUEL) 200 MG tablet Take 600 mg by mouth at bedtime. Per patient taking 500 mg     sertraline (ZOLOFT) 100 MG tablet Take 100 mg by mouth daily.     tadalafil (CIALIS) 20 MG tablet Take 0.5-1 tablets (10-20 mg total) by mouth every other day as needed for erectile dysfunction. 10 tablet 11   tirzepatide (MOUNJARO) 12.5 MG/0.5ML Pen Inject 12.5 mg into the skin once a week. 6 mL 3   Continuous Glucose Receiver (DEXCOM G7 RECEIVER) DEVI To display CGM data (Patient not taking: Reported on 11/07/2022) 1 each 0   Continuous Glucose Sensor (DEXCOM G7 SENSOR) MISC 1 Device by Does not apply route as  directed. Change sensor every 10 days (Patient not taking: Reported on 11/07/2022) 3 each 3   insulin isophane & regular human KwikPen (NOVOLIN 70/30 KWIKPEN) (70-30) 100 UNIT/ML KwikPen Inject 40 units into THE SKIN in the morning with A meal 15 mL 5   No current facility-administered medications for this visit.    PHYSICAL EXAM: Vitals:   11/07/22 1045  BP: 138/80  Pulse: 83  SpO2: 96%  Weight: 280 lb 4.8 oz (127.1 kg)  Height: 6\' 1"  (1.854 m)   Body mass index is 36.98 kg/m.  Wt Readings from Last 3 Encounters:  11/07/22 280 lb 4.8 oz (127.1 kg)  10/28/22 282 lb 12.8 oz (128.3 kg)  10/08/22 282 lb 3.2 oz (128 kg)    General: Well developed, well nourished male in no apparent distress.  HEENT: AT/[place], no external lesions.  Eyes: Conjunctiva clear and no icterus. Neck: Neck supple  Lungs: Respirations not labored Neurologic: Alert, oriented, normal speech Extremities / Skin: Dry. No sores or rashes noted. No acanthosis nigricans Psychiatric: Does not appear depressed or anxious  Diabetic Foot Exam - Simple   No data filed     LABS Reviewed Lab Results  Component Value Date   HGBA1C 6.2 10/28/2022   HGBA1C 7.0 (H) 08/01/2022   HGBA1C 8.2 (H) 05/19/2022   Lab Results  Component Value Date   FRUCTOSAMINE 305 (H) 01/30/2022   FRUCTOSAMINE 290 (H) 08/15/2021   FRUCTOSAMINE 301 (H) 06/18/2021   Lab Results  Component Value Date   CHOL 91 10/28/2021   HDL 23.50 (L) 10/28/2021   LDLCALC 38 10/28/2021   LDLDIRECT 73.0 09/01/2017   TRIG 144.0 10/28/2021   CHOLHDL 4 10/28/2021   Lab Results  Component Value Date   MICRALBCREAT 1.3 08/01/2022   MICRALBCREAT 3.0 02/28/2021   Lab Results  Component Value Date   CREATININE 1.50 10/28/2022   Lab Results  Component Value Date   GFR 48.17 (L) 10/28/2022  ASSESSMENT / PLAN  1. Type 2 diabetes mellitus with hyperglycemia, with long-term current use of insulin (HCC)     Diabetes Mellitus type 2, no known  complications.  - Diabetic status / severity: Controlled  Lab Results  Component Value Date   HGBA1C 6.2 10/28/2022    - Hemoglobin A1c goal : <7%  - Medications: Adjusted as follows.  I) increase Mounjaro from 10 to 12.5 mg weekly. II) decrease Novolin 70/30 to 40 units daily in the morning with breakfast.  Do not take the evening dose of Novolin.  - Home glucose testing: In the morning fasting and at bedtime and as needed. - Discussed/ Gave Hypoglycemia treatment plan.  # Consult : not required at this time.   # Annual urine for microalbuminuria/ creatinine ratio, no microalbuminuria currently, continue ACE/ARB on losartan. Last  Lab Results  Component Value Date   MICRALBCREAT 1.3 08/01/2022    # Foot check nightly.  # Annual dilated diabetic eye exams.   - Diet: Make healthy diabetic food choices - Life style / activity / exercise: Discussed.  2. Blood pressure  -  BP Readings from Last 1 Encounters:  11/07/22 138/80    - Control is in target.  - No change in current plans.  3. Lipid status / Hyperlipidemia - Last  Lab Results  Component Value Date   LDLCALC 38 10/28/2021   - Continue atorvastatin 20 mg daily. -Will check lipid panel today.  Diagnoses and all orders for this visit:  Type 2 diabetes mellitus with hyperglycemia, with long-term current use of insulin (HCC) -     insulin isophane & regular human KwikPen (NOVOLIN 70/30 KWIKPEN) (70-30) 100 UNIT/ML KwikPen; Inject 40 units into THE SKIN in the morning with A meal -     Lipid panel; Future -     Lipid panel  Other orders -     tirzepatide (MOUNJARO) 12.5 MG/0.5ML Pen; Inject 12.5 mg into the skin once a week.    DISPOSITION Follow up in clinic in 3  months suggested.   All questions answered and patient verbalized understanding of the plan.  Matthew Barnes , MD [facility] Endocrinology [facility] [address] [place], [address] [place], [place] [address] Phone # [phone]  At  least part of this note was generated using voice recognition software. Inadvertent word errors may have occurred, which were not recognized during the proofreading process.

## 2022-11-07 NOTE — Patient Instructions (Signed)
Increase Mounjaro to 12.5mg  weekly.  Decrease Novolin mix to 40 unit daily in the morning. No insulin in the evening.

## 2022-11-15 ENCOUNTER — Other Ambulatory Visit: Payer: Self-pay | Admitting: Endocrinology

## 2022-11-16 ENCOUNTER — Other Ambulatory Visit: Payer: Self-pay | Admitting: Endocrinology

## 2022-11-25 ENCOUNTER — Ambulatory Visit (INDEPENDENT_AMBULATORY_CARE_PROVIDER_SITE_OTHER): Payer: Medicare HMO

## 2022-11-25 DIAGNOSIS — I420 Dilated cardiomyopathy: Secondary | ICD-10-CM | POA: Diagnosis not present

## 2022-11-26 LAB — CUP PACEART REMOTE DEVICE CHECK
Battery Remaining Longevity: 126 mo
Battery Voltage: 2.99 V
Brady Statistic RV Percent Paced: 0.01 %
Date Time Interrogation Session: 20240827232826
HighPow Impedance: 72 Ohm
Implantable Lead Connection Status: 753985
Implantable Lead Implant Date: 20230220
Implantable Lead Location: 753860
Implantable Lead Model: 6935
Implantable Pulse Generator Implant Date: 20230220
Lead Channel Impedance Value: 494 Ohm
Lead Channel Impedance Value: 570 Ohm
Lead Channel Pacing Threshold Amplitude: 0.5 V
Lead Channel Pacing Threshold Pulse Width: 0.4 ms
Lead Channel Sensing Intrinsic Amplitude: 11 mV
Lead Channel Sensing Intrinsic Amplitude: 11 mV
Lead Channel Setting Pacing Amplitude: 2 V
Lead Channel Setting Pacing Pulse Width: 0.4 ms
Lead Channel Setting Sensing Sensitivity: 0.45 mV
Zone Setting Status: 755011
Zone Setting Status: 755011

## 2022-12-03 ENCOUNTER — Other Ambulatory Visit: Payer: Self-pay

## 2022-12-03 DIAGNOSIS — E119 Type 2 diabetes mellitus without complications: Secondary | ICD-10-CM

## 2022-12-03 DIAGNOSIS — E1165 Type 2 diabetes mellitus with hyperglycemia: Secondary | ICD-10-CM

## 2022-12-03 MED ORDER — NOVOLIN 70/30 FLEXPEN (70-30) 100 UNIT/ML ~~LOC~~ SUPN
40.0000 [IU] | PEN_INJECTOR | Freq: Every morning | SUBCUTANEOUS | 5 refills | Status: DC
  Filled 2022-12-12: qty 15, 30d supply, fill #0
  Filled 2023-01-28: qty 15, 30d supply, fill #1
  Filled 2023-02-20: qty 15, 38d supply, fill #2
  Filled 2023-03-19: qty 15, 30d supply, fill #2

## 2022-12-03 MED ORDER — TIRZEPATIDE 12.5 MG/0.5ML ~~LOC~~ SOAJ
12.5000 mg | SUBCUTANEOUS | 3 refills | Status: DC
  Filled 2022-12-30: qty 6, 84d supply, fill #0
  Filled 2023-01-12: qty 2, 28d supply, fill #0

## 2022-12-08 NOTE — Progress Notes (Signed)
Remote ICD transmission.   

## 2022-12-10 ENCOUNTER — Other Ambulatory Visit (HOSPITAL_BASED_OUTPATIENT_CLINIC_OR_DEPARTMENT_OTHER): Payer: Self-pay

## 2022-12-10 ENCOUNTER — Emergency Department (HOSPITAL_BASED_OUTPATIENT_CLINIC_OR_DEPARTMENT_OTHER)
Admission: EM | Admit: 2022-12-10 | Discharge: 2022-12-10 | Disposition: A | Discharge status: Home / Self Care | Payer: Medicare HMO | Attending: Emergency Medicine | Admitting: Emergency Medicine

## 2022-12-10 ENCOUNTER — Other Ambulatory Visit: Payer: Self-pay

## 2022-12-10 ENCOUNTER — Emergency Department (HOSPITAL_BASED_OUTPATIENT_CLINIC_OR_DEPARTMENT_OTHER): Payer: Medicare HMO | Admitting: Radiology

## 2022-12-10 ENCOUNTER — Encounter (HOSPITAL_BASED_OUTPATIENT_CLINIC_OR_DEPARTMENT_OTHER): Payer: Self-pay | Admitting: Emergency Medicine

## 2022-12-10 DIAGNOSIS — N1832 Chronic kidney disease, stage 3b: Secondary | ICD-10-CM | POA: Diagnosis not present

## 2022-12-10 DIAGNOSIS — Z794 Long term (current) use of insulin: Secondary | ICD-10-CM | POA: Insufficient documentation

## 2022-12-10 DIAGNOSIS — I509 Heart failure, unspecified: Secondary | ICD-10-CM | POA: Diagnosis not present

## 2022-12-10 DIAGNOSIS — W1839XA Other fall on same level, initial encounter: Secondary | ICD-10-CM | POA: Diagnosis not present

## 2022-12-10 DIAGNOSIS — I130 Hypertensive heart and chronic kidney disease with heart failure and stage 1 through stage 4 chronic kidney disease, or unspecified chronic kidney disease: Secondary | ICD-10-CM | POA: Insufficient documentation

## 2022-12-10 DIAGNOSIS — Z79899 Other long term (current) drug therapy: Secondary | ICD-10-CM | POA: Diagnosis not present

## 2022-12-10 DIAGNOSIS — M25561 Pain in right knee: Secondary | ICD-10-CM | POA: Diagnosis not present

## 2022-12-10 DIAGNOSIS — M79641 Pain in right hand: Secondary | ICD-10-CM | POA: Insufficient documentation

## 2022-12-10 DIAGNOSIS — Y92002 Bathroom of unspecified non-institutional (private) residence single-family (private) house as the place of occurrence of the external cause: Secondary | ICD-10-CM | POA: Insufficient documentation

## 2022-12-10 DIAGNOSIS — E039 Hypothyroidism, unspecified: Secondary | ICD-10-CM | POA: Insufficient documentation

## 2022-12-10 DIAGNOSIS — M1711 Unilateral primary osteoarthritis, right knee: Secondary | ICD-10-CM | POA: Diagnosis not present

## 2022-12-10 DIAGNOSIS — W19XXXA Unspecified fall, initial encounter: Secondary | ICD-10-CM

## 2022-12-10 DIAGNOSIS — Y9302 Activity, running: Secondary | ICD-10-CM | POA: Insufficient documentation

## 2022-12-10 DIAGNOSIS — E1122 Type 2 diabetes mellitus with diabetic chronic kidney disease: Secondary | ICD-10-CM | POA: Insufficient documentation

## 2022-12-10 MED ORDER — CEPHALEXIN 500 MG PO CAPS
500.0000 mg | ORAL_CAPSULE | Freq: Four times a day (QID) | ORAL | 0 refills | Status: DC
  Filled 2022-12-10: qty 20, 5d supply, fill #0

## 2022-12-10 MED ORDER — BACITRACIN 500 UNIT/GM EX OINT
1.0000 | TOPICAL_OINTMENT | Freq: Two times a day (BID) | CUTANEOUS | 0 refills | Status: AC
  Filled 2022-12-10: qty 60, 33d supply, fill #0

## 2022-12-10 NOTE — Discharge Instructions (Signed)
As discussed, x-ray imaging studies were negative for any fracture or dislocation.  Recommend treatment of your pain at home with Tylenol.  Recommend topical antibiotic ointment over areas where you have been scraped from the fall.  Please do not hesitate to return to emergency department if the worrisome signs and symptoms we discussed become apparent.

## 2022-12-10 NOTE — ED Provider Notes (Signed)
[facility] DE[facility] AT [facility] Provider Note   CSN: [id number] Arrival date & time: 12/10/22  1355     History  Chief Complaint  Patient presents with   Knee Injury    Matthew Barnes is a 67 y.o. male.  HPI   67 year old male presents emergency department with complaints of fall.  Patient states that fall occurred 2 days ago when he tripped on a ledge trying to get to the bathroom.  Reports bracing his fall with his right hand.  Currently complaining of some right hand pain as well as right knee pain.  Does report scraping the right side of face on the concrete but denies loss of consciousness, blood thinner use.  Denies any blurred vision, weakness or sensory deficits in upper extremities, slurred speech, facial droop, gait abnormalities.  Denies any chest pain, shortness of breath, abdominal pain, nausea, vomiting.  States has been able to ambulate but with some discomfort in his right knee whenever he tries to bend his knee completely.  Past medical history significant for diabetes mellitus, hyperlipidemia, hypertension, bipolar 2 disorder, hypothyroidism, CHF, cardiomyopathy, obesity, CKD 3B  Home Medications Prior to Admission medications   Medication Sig Start Date End Date Taking? Authorizing Provider  bacitracin 500 UNIT/GM ointment Apply 1 Application topically 2 (two) times daily. 12/10/22  Yes Sherian Maroon A, PA  cephALEXin (KEFLEX) 500 MG capsule Take 1 capsule (500 mg total) by mouth 4 (four) times daily. 12/10/22  Yes Sherian Maroon A, PA  ACCU-CHEK GUIDE test strip USE TO check blood glucose twice daily AS DIRECTED 11/17/22   Reather Littler, MD  Accu-Chek Softclix Lancets lancets Check blood sugar 2 times daily 10/08/22   Reather Littler, MD  atorvastatin (LIPITOR) 20 MG tablet Take 1 tablet (20 mg total) by mouth daily. 10/28/22   Karie Georges, MD  Blood Glucose Monitoring Suppl (ACCU-CHEK GUIDE) w/Device KIT Check blood sugar 2 times daily 10/14/22    Reather Littler, MD  carvedilol (COREG) 6.25 MG tablet TAKE ONE TABLET BY MOUTH AT [facility] AND AT BEDTIME 07/01/22   Karie Georges, MD  Continuous Glucose Receiver (DEXCOM G7 RECEIVER) DEVI To display CGM data Patient not taking: Reported on 11/07/2022 08/07/22   Reather Littler, MD  Continuous Glucose Sensor (DEXCOM G7 SENSOR) MISC 1 Device by Does not apply route as directed. Change sensor every 10 days Patient not taking: Reported on 11/07/2022 08/07/22   Reather Littler, MD  digoxin (LANOXIN) 0.125 MG tablet TAKE ONE TABLET BY MOUTH EVERYDAY AT BEDTIME 10/28/22   Karie Georges, MD  furosemide (LASIX) 20 MG tablet Take 1 tablet (20 mg total) by mouth every evening. 10/28/22   Karie Georges, MD  insulin isophane & regular human KwikPen (NOVOLIN 70/30 KWIKPEN) (70-30) 100 UNIT/ML KwikPen Inject 40 units into THE SKIN in the morning with A meal 12/03/22   Thapa, [place], MD  Insulin Pen Needle (COMFORT EZ PEN NEEDLES) 31G X 6 MM MISC USE AS DIRECTED TWICE DAILY 11/17/22   Reather Littler, MD  Insulin Syringe-Needle U-100 (INSULIN SYRINGE 1CC/31GX5/16") 31G X 5/16" 1 ML MISC Use 3 needles per day 04/10/22   Reather Littler, MD  losartan (COZAAR) 50 MG tablet Take 1 tablet (50 mg total) by mouth daily. 10/28/22   Karie Georges, MD  meclizine (ANTIVERT) 25 MG tablet Take 0.5-1 tablets (12.5-25 mg total) by mouth 3 (three) times daily as needed for dizziness. 10/28/22   Karie Georges, MD  omeprazole (PRILOSEC) 20  MG capsule Take 1 capsule (20 mg total) by mouth daily. 10/28/22   Karie Georges, MD  QUEtiapine (SEROQUEL) 200 MG tablet Take 600 mg by mouth at bedtime. Per patient taking 500 mg    [provider]  sertraline (ZOLOFT) 100 MG tablet Take 100 mg by mouth daily. 09/08/22   [provider]  tadalafil (CIALIS) 20 MG tablet Take 0.5-1 tablets (10-20 mg total) by mouth every other day as needed for erectile dysfunction. 01/22/22   Karie Georges, MD  tirzepatide [facility]) 12.5  MG/0.5ML Pen Inject 12.5 mg into the skin once a week. 12/03/22   Thapa, [place], MD      Allergies    Influenza vaccines    Review of Systems   Review of Systems  All other systems reviewed and are negative.   Physical Exam Updated Vital Signs BP (!) 180/92   Pulse 84   Temp 98.6 F (37 C)   Resp 17   Wt 127 kg   SpO2 100%   BMI 36.94 kg/m  Physical Exam Vitals and nursing note reviewed.  Constitutional:      General: He is not in acute distress.    Appearance: He is well-developed.  HENT:     Head: Normocephalic and atraumatic.  Eyes:     Conjunctiva/sclera: Conjunctivae normal.  Cardiovascular:     Rate and Rhythm: Normal rate and regular rhythm.  Pulmonary:     Effort: Pulmonary effort is normal. No respiratory distress.     Breath sounds: Normal breath sounds. No wheezing, rhonchi or rales.  Chest:     Chest wall: No tenderness.  Abdominal:     Palpations: Abdomen is soft.     Tenderness: There is no abdominal tenderness.  Musculoskeletal:        General: No swelling.     Cervical back: Neck supple.     Comments: No midline tenderness of cervical, thoracic, lumbar spine with no step-off or deformity noted.  No chest wall tenderness to palpation.  Some tenderness on the dorsal aspect of distal second and third metacarpals but otherwise, no tenderness of bilateral upper extremities.  Some tenderness along the anterior right knee.  Abrasion appreciated to right elbow, right side of face, anterior right knee.  Full range of motion of bilateral shoulders, elbows, wrist, digits, knees, hips, ankles, digits.  Pedal and radial pulses 2+ bilaterally.  No sensory deficits along major nerve regions of upper or lower extremities.  Muscular strength 5 out of 5 for bilateral upper and lower extremities.  Skin:    General: Skin is warm and dry.     Capillary Refill: Capillary refill takes less than 2 seconds.  Neurological:     Mental Status: He is alert.     Comments: Alert and  oriented to self, place, time and event.   Speech is fluent, clear without dysarthria or dysphasia.   Strength 5/5 in upper/lower extremities   Sensation intact in upper/lower extremities   Normal gait.  CN I not tested  CN II not tested CN III, IV, VI PERRLA and EOMs intact bilaterally  CN V Intact sensation to sharp and light touch to the face  CN VII facial movements symmetric  CN VIII not tested  CN IX, X no uvula deviation, symmetric rise of soft palate  CN XI 5/5 SCM and trapezius strength bilaterally  CN XII Midline tongue protrusion, symmetric L/R movements     Psychiatric:        Mood  and Affect: Mood normal.     ED Results / Procedures / Treatments   Labs (all labs ordered are listed, but only abnormal results are displayed) Labs Reviewed - No data to display  EKG None  Radiology DG Hand Complete Right  Result Date: 12/10/2022 CLINICAL DATA:  Right hand pain after fall. EXAM: RIGHT HAND - COMPLETE 3+ VIEW COMPARISON:  None Available. FINDINGS: There is no evidence of fracture or dislocation. Distal radius and ulna are obscured by overlying bracelet. There is no evidence of arthropathy or other focal bone abnormality. Soft tissues are unremarkable. IMPRESSION: No definite abnormality seen. Electronically Signed   By: Lupita Raider M.D.   On: 12/10/2022 15:36   DG Knee Complete 4 Views Right  Result Date: 12/10/2022 CLINICAL DATA:  Right knee pain after fall. EXAM: RIGHT KNEE - COMPLETE 4+ VIEW COMPARISON:  None Available. FINDINGS: No evidence of fracture, dislocation, or joint effusion. Moderate narrowing of medial joint space is noted. Mild osteophyte formation is noted laterally. Mild narrowing of patellofemoral space is noted. Soft tissues are unremarkable. IMPRESSION: Tricompartmental degenerative joint disease as noted above. No acute abnormality seen. Electronically Signed   By: Lupita Raider M.D.   On: 12/10/2022 15:34    Procedures Procedures     Medications Ordered in ED Medications - No data to display  ED Course/ Medical Decision Making/ A&P                                 Medical Decision Making Amount and/or Complexity of Data Reviewed Radiology: ordered.  Risk OTC drugs. Prescription drug management.   This patient presents to the ED for concern of fall, this involves an extensive number of treatment options, and is a complaint that carries with it a high risk of complications and morbidity.  The differential diagnosis includes CVA, fracture, strain/pain, dislocation, ligamentous/tendinous injury, neurovascular compromise   Co morbidities that complicate the patient evaluation  See HPI   Additional history obtained:  Additional history obtained from EMR External records from outside source obtained and reviewed including hospital records   Lab Tests:  N/a   Imaging Studies ordered:  I ordered imaging studies including right hand x-ray, right knee x-ray I independently visualized and interpreted imaging which showed  Right hand x-ray: No acute osseous abnormality Right knee x-ray: No osseous abnormality I agree with the radiologist interpretation   Cardiac Monitoring: / EKG:  The patient was maintained on a cardiac monitor.  I personally viewed and interpreted the cardiac monitored which showed an underlying rhythm of: sinus rhythm   Consultations Obtained:  N/a   Problem List / ED Course / Critical interventions / Medication management  Fall Reevaluation of the patient showed that the patient stayed the same I have reviewed the patients home medicines and have made adjustments as needed   Social Determinants of Health:  Denies tobacco, licit drug use   Test / Admission - Considered:  Fall Vitals signs significant for hypertension blood pressure 197/94. Otherwise within normal range and stable throughout visit. Imaging studies significant for: See above 67 year old male  presents emergency department after mechanical fall that occurred a couple days ago with main complaints of right hand as well as right knee pain.  Patient was scraping of his right face but without loss consciousness, nausea, emesis, anticoagulation use, neurodeficit.  Given patient's age as well as head injury, offered patient CT scan of the  head despite trauma being very minor in nature and patient declined.  Patient with reproducible tenderness on exam of right knee as well as part of right hand.  X-rays are obtained which are negative for any acute fracture/dislocation.  Patient was with some superficial abrasions treated with antibiotic ointment while in the ED.  Will recommend local wound care in the outpatient setting.  No pulse deficits concerning for ischemic limb.  No overlying skin changes concerning for secondary infectious process.  No upper or lower extremity swelling concerning for DVT.  Recommend follow-up with primary care for reassessment of symptoms.  Treatment plan discussed at length with patient and he acknowledged understanding was agreeable to said plan.  Patient overall well-appearing, afebrile in no acute distress. Worrisome signs and symptoms were discussed with the patient, and the patient acknowledged understanding to return to the ED if noticed. Patient was stable upon discharge.          Final Clinical Impression(s) / ED Diagnoses Final diagnoses:  Fall, initial encounter  Acute pain of right knee    Rx / DC Orders      Peter Garter, [place] 12/10/22 1609    Jacalyn Lefevre, MD 12/11/22 0700

## 2022-12-10 NOTE — ED Notes (Signed)
 RN reviewed discharge instructions with pt. Pt verbalized understanding and had no further questions. VSS upon discharge.  

## 2022-12-10 NOTE — ED Triage Notes (Signed)
Mechanical fall 2 days ago , was running to bathroom , facial skin abrasion .  right knee injury , swelling , skin abrasion .  No loc

## 2022-12-10 NOTE — ED Notes (Signed)
Abrasions to R knee, right hand and right side of forehead cleaned, bacitracin applied, right knee wrapped with kerlex

## 2022-12-11 ENCOUNTER — Other Ambulatory Visit (HOSPITAL_BASED_OUTPATIENT_CLINIC_OR_DEPARTMENT_OTHER): Payer: Self-pay

## 2022-12-12 ENCOUNTER — Other Ambulatory Visit: Payer: Self-pay

## 2022-12-12 ENCOUNTER — Other Ambulatory Visit (HOSPITAL_COMMUNITY): Payer: Self-pay

## 2022-12-12 ENCOUNTER — Other Ambulatory Visit (HOSPITAL_BASED_OUTPATIENT_CLINIC_OR_DEPARTMENT_OTHER): Payer: Self-pay

## 2022-12-12 ENCOUNTER — Telehealth: Payer: Self-pay | Admitting: Family Medicine

## 2022-12-12 MED ORDER — FUROSEMIDE 20 MG PO TABS
20.0000 mg | ORAL_TABLET | Freq: Every evening | ORAL | 1 refills | Status: DC
  Filled 2022-12-30: qty 90, 90d supply, fill #0
  Filled 2022-12-30: qty 30, 30d supply, fill #0
  Filled 2023-01-28: qty 30, 30d supply, fill #1
  Filled 2023-02-18 – 2023-02-20 (×3): qty 30, 30d supply, fill #2
  Filled 2023-03-18 – 2023-03-19 (×3): qty 30, 30d supply, fill #3
  Filled 2023-04-06 – 2023-04-22 (×2): qty 30, 30d supply, fill #4
  Filled 2023-05-11 – 2023-05-18 (×2): qty 30, 30d supply, fill #5
  Filled ????-??-??: fill #4
  Filled ????-??-??: fill #3

## 2022-12-12 MED ORDER — QUETIAPINE FUMARATE 200 MG PO TABS
600.0000 mg | ORAL_TABLET | Freq: Every day | ORAL | 2 refills | Status: DC
  Filled 2022-12-30: qty 270, 90d supply, fill #0
  Filled 2022-12-30: qty 90, 30d supply, fill #0
  Filled 2023-01-28: qty 90, 30d supply, fill #1
  Filled 2023-02-18 – 2023-02-20 (×3): qty 90, 30d supply, fill #2
  Filled 2023-03-18 – 2023-04-22 (×4): qty 90, 30d supply, fill #3
  Filled 2023-05-11 – 2023-05-18 (×2): qty 90, 30d supply, fill #4
  Filled 2023-06-04 – 2023-06-11 (×2): qty 90, 30d supply, fill #5
  Filled 2023-06-19 – 2023-07-07 (×2): qty 90, 30d supply, fill #6
  Filled 2023-07-21 – 2023-07-30 (×2): qty 90, 30d supply, fill #7
  Filled 2023-08-17 – 2023-09-08 (×3): qty 90, 30d supply, fill #8
  Filled ????-??-?? (×2): fill #3

## 2022-12-12 MED ORDER — INSULIN ASPART PROT & ASPART (70-30 MIX) 100 UNIT/ML PEN
40.0000 [IU] | PEN_INJECTOR | Freq: Every morning | SUBCUTANEOUS | 4 refills | Status: DC
  Filled 2022-12-30 – 2023-03-18 (×3): qty 15, 37d supply, fill #0

## 2022-12-12 MED ORDER — CARVEDILOL 6.25 MG PO TABS
6.2500 mg | ORAL_TABLET | Freq: Two times a day (BID) | ORAL | 0 refills | Status: DC
  Filled 2022-12-30: qty 180, 90d supply, fill #0
  Filled 2022-12-30: qty 60, 30d supply, fill #0
  Filled 2023-01-28: qty 60, 30d supply, fill #1
  Filled 2023-02-18 – 2023-02-20 (×3): qty 60, 30d supply, fill #2

## 2022-12-12 MED ORDER — MOUNJARO 12.5 MG/0.5ML ~~LOC~~ SOAJ
12.5000 mg | SUBCUTANEOUS | 3 refills | Status: DC
Start: 2022-11-07 — End: 2023-04-22
  Filled 2022-12-12: qty 2, 28d supply, fill #0
  Filled 2023-01-12: qty 2, 28d supply, fill #1
  Filled 2023-02-21: qty 2, 28d supply, fill #2
  Filled 2023-03-18 – 2023-04-08 (×5): qty 2, 28d supply, fill #3

## 2022-12-12 MED ORDER — ACCU-CHEK GUIDE VI STRP: ORAL_STRIP | 3 refills | Status: AC

## 2022-12-12 MED ORDER — MOUNJARO 10 MG/0.5ML ~~LOC~~ SOAJ
10.0000 mg | SUBCUTANEOUS | 1 refills | Status: DC
  Filled 2023-01-12: qty 6, 84d supply, fill #0

## 2022-12-12 MED ORDER — SERTRALINE HCL 100 MG PO TABS
100.0000 mg | ORAL_TABLET | Freq: Every day | ORAL | 0 refills | Status: DC
  Filled 2022-12-30: qty 90, 90d supply, fill #0
  Filled 2022-12-30: qty 30, 30d supply, fill #0
  Filled 2023-01-28: qty 30, 30d supply, fill #1
  Filled 2023-02-18 – 2023-02-20 (×3): qty 30, 30d supply, fill #2

## 2022-12-12 MED ORDER — QUETIAPINE FUMARATE 200 MG PO TABS
ORAL_TABLET | ORAL | 2 refills | Status: AC
  Filled 2022-12-30 – 2023-03-19 (×4): qty 270, 90d supply, fill #0

## 2022-12-12 MED ORDER — LOSARTAN POTASSIUM 50 MG PO TABS
50.0000 mg | ORAL_TABLET | Freq: Every day | ORAL | 1 refills | Status: DC
  Filled 2022-12-25: qty 90, 90d supply, fill #0
  Filled 2022-12-30: qty 30, 30d supply, fill #0
  Filled 2022-12-30: qty 90, 90d supply, fill #0
  Filled 2023-01-28: qty 30, 30d supply, fill #1
  Filled 2023-02-18 – 2023-02-20 (×3): qty 30, 30d supply, fill #2
  Filled 2023-03-18 – 2023-03-19 (×3): qty 30, 30d supply, fill #3
  Filled 2023-04-06 – 2023-04-22 (×2): qty 30, 30d supply, fill #4
  Filled 2023-05-11 – 2023-05-18 (×2): qty 30, 30d supply, fill #5
  Filled ????-??-??: fill #3
  Filled ????-??-??: fill #4

## 2022-12-12 NOTE — Telephone Encounter (Signed)
Matthew Barnes with [facility] called in and wanted to confirm that pt has a chronic condition of diabetes and cardiovascular disease. Call back number is: [phone].

## 2022-12-15 ENCOUNTER — Other Ambulatory Visit: Payer: Self-pay

## 2022-12-15 ENCOUNTER — Other Ambulatory Visit (HOSPITAL_BASED_OUTPATIENT_CLINIC_OR_DEPARTMENT_OTHER): Payer: Self-pay

## 2022-12-15 NOTE — Telephone Encounter (Signed)
Call not returned to the insurance company as information is not given without a signed release from the patient.

## 2022-12-16 ENCOUNTER — Other Ambulatory Visit (HOSPITAL_BASED_OUTPATIENT_CLINIC_OR_DEPARTMENT_OTHER): Payer: Self-pay

## 2022-12-17 ENCOUNTER — Other Ambulatory Visit (HOSPITAL_BASED_OUTPATIENT_CLINIC_OR_DEPARTMENT_OTHER): Payer: Self-pay

## 2022-12-19 ENCOUNTER — Other Ambulatory Visit (HOSPITAL_BASED_OUTPATIENT_CLINIC_OR_DEPARTMENT_OTHER): Payer: Self-pay

## 2022-12-23 ENCOUNTER — Other Ambulatory Visit (HOSPITAL_BASED_OUTPATIENT_CLINIC_OR_DEPARTMENT_OTHER): Payer: Self-pay

## 2022-12-23 ENCOUNTER — Other Ambulatory Visit (HOSPITAL_COMMUNITY): Payer: Self-pay

## 2022-12-25 ENCOUNTER — Other Ambulatory Visit: Payer: Self-pay

## 2022-12-29 ENCOUNTER — Other Ambulatory Visit (HOSPITAL_BASED_OUTPATIENT_CLINIC_OR_DEPARTMENT_OTHER): Payer: Self-pay

## 2022-12-30 ENCOUNTER — Other Ambulatory Visit (HOSPITAL_COMMUNITY): Payer: Self-pay

## 2022-12-30 ENCOUNTER — Other Ambulatory Visit: Payer: Self-pay

## 2022-12-30 ENCOUNTER — Other Ambulatory Visit (HOSPITAL_BASED_OUTPATIENT_CLINIC_OR_DEPARTMENT_OTHER): Payer: Self-pay

## 2023-01-05 ENCOUNTER — Telehealth: Payer: Self-pay | Admitting: *Deleted

## 2023-01-05 ENCOUNTER — Other Ambulatory Visit (HOSPITAL_BASED_OUTPATIENT_CLINIC_OR_DEPARTMENT_OTHER): Payer: Self-pay

## 2023-01-05 ENCOUNTER — Ambulatory Visit: Payer: Medicare HMO

## 2023-01-05 VITALS — BP 150/92

## 2023-01-05 DIAGNOSIS — I10 Essential (primary) hypertension: Secondary | ICD-10-CM | POA: Diagnosis not present

## 2023-01-05 DIAGNOSIS — Z794 Long term (current) use of insulin: Secondary | ICD-10-CM

## 2023-01-05 DIAGNOSIS — E1140 Type 2 diabetes mellitus with diabetic neuropathy, unspecified: Secondary | ICD-10-CM | POA: Diagnosis not present

## 2023-01-05 DIAGNOSIS — N528 Other male erectile dysfunction: Secondary | ICD-10-CM

## 2023-01-05 MED ORDER — TADALAFIL 20 MG PO TABS
10.0000 mg | ORAL_TABLET | ORAL | 3 refills | Status: DC | PRN
  Filled 2023-01-05: qty 10, 20d supply, fill #0
  Filled 2023-01-28: qty 10, 20d supply, fill #1

## 2023-01-05 NOTE — Telephone Encounter (Signed)
-----   Message from Sherrill Raring sent at 01/05/2023  1:33 PM EDT ----- Regarding: Med Refill Patient is requesting a refill on the following medication:  Tadalafil 20mg  1/2 to 1 tab every other day as needed for ED  [facility]erred Pharmacy: [facility] [address] [place] [address] Phone: [phone]  Fax: [phone]    Thank you! Sherrill Raring, PharmD Clinical Pharmacist [phone]

## 2023-01-05 NOTE — Telephone Encounter (Signed)
Rx done. 

## 2023-01-05 NOTE — Progress Notes (Addendum)
01/05/2023 Name: Matthew Barnes MRN: [id number] DOB: Aug 19, 1955  Chief Complaint  Patient presents with   Diabetes   Hypertension   Medication Management    Matthew Barnes is Barnes 67 y.o. year old male who was referred for medication management by their primary care provider, Matthew Georges, MD. They presented for Barnes face to face visit today.   They were referred to the pharmacist by their PCP for assistance in managing diabetes, hypertension, hyperlipidemia, and complex medication management    Subjective:  Care Team: Primary Care Provider: Karie Georges, MD ;  Medication Access/Adherence  [facility]  [phone] [place], [place] - [address] [place] [address] Phone: [phone] Fax: [phone]  [facility] [address] [place] [address] Phone: [phone] Fax: [phone]   Patient reports affordability concerns with their medications: No  Patient reports access/transportation concerns to their pharmacy: No  Patient reports adherence concerns with their medications:  No     Diabetes:  Current medications: Novolin 70/30 Kwikpen 40 units in morning with Barnes meal, Mounjaro Medications tried in the past: Metformin, Ozempic, Victoza  Current glucose readings: 127 fasting this am, denies any readings above 130, cannot recall post-prandial sugars Using Accu Check meter; testing two times daily    Patient denies hypoglycemic s/sx including dizziness, shakiness, sweating. Patient denies hyperglycemic symptoms including polyuria, polydipsia, polyphagia, nocturia, neuropathy, blurred vision.  Current meal patterns:  Being very mindful of carbs, is down 40 lbs since starting the mounjaro  Hypertension:  Current medications: Carvedilol 6.25mg  BID, Lasix 20mg , Losartan 50mg  Medications previously tried: Amlodipine, hydrochlorothiazide, Metoprolol  Patient does not  have Barnes validated, automated, upper arm home BP cuff Current blood pressure readings readings: Not checking  Patient denies hypotensive s/sx including dizziness, lightheadedness. (States positional dizziness that is long-standing and he feels is unrelated to BP). Patient denies hypertensive symptoms including headache, chest pain, shortness of breath  Current meal patterns: mindful of salt intake   Objective:  Lab Results  Component Value Date   HGBA1C 6.2 10/28/2022    Lab Results  Component Value Date   CREATININE 1.50 10/28/2022   BUN 13 10/28/2022   NA 139 10/28/2022   K 4.2 10/28/2022   CL 102 10/28/2022   CO2 31 10/28/2022    Lab Results  Component Value Date   CHOL 96 11/07/2022   HDL 22.00 (L) 11/07/2022   LDLCALC 38 10/28/2021   LDLDIRECT 53.0 11/07/2022   TRIG 257.0 (H) 11/07/2022   CHOLHDL 4 11/07/2022    Medications Reviewed Today     Reviewed by Sherrill Raring, RPH (Pharmacist) on 01/05/23 at 1326  Med List Status: <None>   Medication Order Taking? Sig Documenting Provider Last Dose Status Informant  ACCU-CHEK GUIDE test strip [id number]  USE TO check blood glucose twice daily AS DIRECTED Reather Littler, MD  Active   Accu-Chek Softclix Lancets lancets [id number] No Check blood sugar 2 times daily Reather Littler, MD Taking Active   atorvastatin (LIPITOR) 20 MG tablet [id number] No Take 1 tablet (20 mg total) by mouth daily at bedtime Matthew Georges, MD Taking Active   bacitracin 500 UNIT/GM ointment [id number]  Apply 1 Application topically 2 (two) times daily. Matthew Maroon A, PA  Active   Blood Glucose Monitoring Suppl (ACCU-CHEK GUIDE) w/Device KIT [id number] No Check blood sugar 2 times daily Reather Littler, MD Taking Active   carvedilol (COREG) 6.25 MG tablet [id number]  No TAKE ONE TABLET BY MOUTH AT BREAKFAST AND AT BEDTIME Matthew Georges, MD Taking Active   carvedilol (COREG) 6.25 MG tablet [id number]  Take 1 tablet (6.25 mg total) by mouth 2 (two) times  daily at breakfast and at bedtime. Matthew Georges, MD  Active   cephALEXin [facility]) 500 MG capsule [id number]  Take 1 capsule (500 mg total) by mouth 4 (four) times daily. Matthew Barnes, [place]  Active   Continuous Glucose Receiver Matthew Barnes [place]) [place] [id number] No To display CGM data  Patient not taking: Reported on 11/07/2022   Reather Littler, MD Not Taking Active   Continuous Glucose Sensor (DEXCOM Barnes SENSOR) MISC [id number] No 1 Device by Does not apply route as directed. Change sensor every 10 days  Patient not taking: Reported on 11/07/2022   Reather Littler, MD Not Taking Active   digoxin (LANOXIN) 0.125 MG tablet [id number] No Take 1 tablet (0.125 mg total) by mouth at bedtime. Matthew Georges, MD Taking Active   furosemide (LASIX) 20 MG tablet [id number] No Take 1 tablet (20 mg total) by mouth every evening. Matthew Georges, MD Taking Active   furosemide (LASIX) 20 MG tablet [id number]  Take 1 tablet (20 mg total) by mouth every evening. Matthew Georges, MD  Active   glucose blood (ACCU-CHEK GUIDE) test strip [id number]  Use to check blood sugar 2 times daily   Active   insulin aspart protamine - aspart (NOVOLOG 70/30 MIX) (70-30) 100 UNIT/ML FlexPen [id number]  Inject 40 Units into the skin in the morning with Barnes meal Thapa, [place], MD  Active   insulin isophane & regular human KwikPen (NOVOLIN 70/30 KWIKPEN) (70-30) 100 UNIT/ML KwikPen [id number]  Inject 40 Units into the skin in the morning with Barnes meal Thapa, [place], MD  Active   Insulin Pen Needle (COMFORT EZ PEN NEEDLES) 31G X 6 MM MISC [id number]  USE AS DIRECTED TWICE DAILY Reather Littler, MD  Active   Insulin Syringe-Needle U-100 (INSULIN SYRINGE 1CC/31GX5/16") 31G X 5/16" 1 ML MISC [id number] No Use 3 needles per day Reather Littler, MD Taking Active   losartan (COZAAR) 50 MG tablet [id number] No Take 1 tablet (50 mg total) by mouth daily. Matthew Georges, MD Taking Active   losartan (COZAAR) 50 MG tablet [id number]  Take 1 tablet (50 mg  total) by mouth at bedtime. Matthew Georges, MD  Active   meclizine (ANTIVERT) 25 MG tablet [id number] No Take 0.5-1 tablets (12.5-25 mg total) by mouth 3 (three) times daily as needed for dizziness. Matthew Georges, MD Taking Active   omeprazole (PRILOSEC) 20 MG capsule [id number] No Take 1 capsule (20 mg total) by mouth daily. Matthew Georges, MD Taking Active   QUEtiapine (SEROQUEL) 200 MG tablet [id number] No Take 600 mg by mouth at bedtime. Per patient taking 500 mg [provider] Taking Active Self  QUEtiapine (SEROQUEL) 200 MG tablet [id number]  Take 3 tablets (600 mg total) by mouth at bedtime.   Active   QUEtiapine (SEROQUEL) 200 MG tablet [id number]  Take 2-3 tablets (400-600 mg total) by mouth every evening.   Active   sertraline (ZOLOFT) 100 MG tablet [id number] No Take 100 mg by mouth daily. [provider] Taking Active   sertraline (ZOLOFT) 100 MG tablet [id number]  Take 1 tablet (100 mg total) by mouth daily.   Active   tadalafil (CIALIS) 20 MG tablet [id number] No Take 0.5-1 tablets (10-20 mg total) by mouth every other day as needed  for erectile dysfunction. Matthew Georges, MD Taking Active   tirzepatide [facility]) 10 MG/0.5ML Pen [id number]  Inject 10 mg into the skin once Barnes week.   Active   tirzepatide [facility]) 12.5 MG/0.5ML Pen [id number]  Inject 12.5 mg into the skin once Barnes week. Thapa, [place], MD  Active   tirzepatide [facility]) 12.5 MG/0.5ML Pen [id number]  Inject 12.5 mg into the skin once Barnes week. Thapa, [place], MD  Active               Assessment/Plan:   Diabetes: - Currently controlled - Reviewed long term cardiovascular and renal outcomes of uncontrolled blood sugar - Reviewed goal A1c, goal fasting, and goal 2 hour post prandial glucose - Reviewed dietary modifications including low-carb intake, not drinking sugars - Recommend to continue current medication regimen  - Recommend to check glucose twice daily -Will resubmit order for Dexcom  Barnes sensors through parachute, per standing DM sensor protocol order    Hypertension: - Currently uncontrolled -Elevated in office but just picked up his medications prior to appt and had not taken for the day. Near the time of day where he takes his evening dose of meds, so will resume doses with evening meds - Reviewed long term cardiovascular and renal outcomes of uncontrolled blood pressure - Reviewed appropriate blood pressure monitoring technique and reviewed goal blood pressure. Recommended to check home blood pressure and heart rate once weekly - Recommend to purchase new BP cuff and continue current medications   Rx Refills: Pt requesting refill on Cialis, sending request to PCP.    Follow Up Plan: 4 weeks  Sherrill Raring, PharmD Clinical Pharmacist [phone]

## 2023-01-09 ENCOUNTER — Other Ambulatory Visit: Payer: Self-pay

## 2023-01-09 DIAGNOSIS — E1165 Type 2 diabetes mellitus with hyperglycemia: Secondary | ICD-10-CM

## 2023-01-09 MED ORDER — HUMULIN 70/30 KWIKPEN (70-30) 100 UNIT/ML ~~LOC~~ SUPN: 40.0000 [IU] | PEN_INJECTOR | Freq: Every day | SUBCUTANEOUS | 0 refills | Status: DC

## 2023-01-12 ENCOUNTER — Other Ambulatory Visit (HOSPITAL_BASED_OUTPATIENT_CLINIC_OR_DEPARTMENT_OTHER): Payer: Self-pay

## 2023-01-12 ENCOUNTER — Other Ambulatory Visit (HOSPITAL_COMMUNITY): Payer: Self-pay

## 2023-01-13 ENCOUNTER — Telehealth: Payer: Self-pay | Admitting: *Deleted

## 2023-01-13 NOTE — Telephone Encounter (Signed)
Transition Care Management Unsuccessful Follow-up Telephone Call  Date of discharge and from where:  [facility]  12/10/2022  Attempts:  1st Attempt  Reason for unsuccessful TCM follow-up call:  Left voice message

## 2023-01-14 ENCOUNTER — Other Ambulatory Visit (HOSPITAL_COMMUNITY): Payer: Self-pay

## 2023-01-14 ENCOUNTER — Telehealth: Payer: Self-pay

## 2023-01-14 NOTE — Progress Notes (Signed)
01/14/2023  Patient ID: Matthew Barnes, male   DOB: [birth year], 67 y.o.   MRN: [id number]  Attempted to contact patient to provide update on [employer] through [facility] via parachute. They have been attempting to contact him and would like him to return their call at [phone] so they can continuing processing the order. Left HIPAA compliant message for patient to return my call at their convenience.   Sherrill Raring, PharmD Clinical Pharmacist [phone]

## 2023-01-20 ENCOUNTER — Other Ambulatory Visit: Payer: Self-pay

## 2023-01-20 ENCOUNTER — Other Ambulatory Visit (HOSPITAL_BASED_OUTPATIENT_CLINIC_OR_DEPARTMENT_OTHER): Payer: Self-pay

## 2023-01-28 ENCOUNTER — Other Ambulatory Visit: Payer: Self-pay

## 2023-01-28 ENCOUNTER — Other Ambulatory Visit (HOSPITAL_BASED_OUTPATIENT_CLINIC_OR_DEPARTMENT_OTHER): Payer: Self-pay

## 2023-01-29 ENCOUNTER — Other Ambulatory Visit (HOSPITAL_COMMUNITY): Payer: Self-pay

## 2023-01-29 ENCOUNTER — Other Ambulatory Visit: Payer: Self-pay

## 2023-02-03 ENCOUNTER — Other Ambulatory Visit: Payer: Medicare HMO

## 2023-02-10 ENCOUNTER — Other Ambulatory Visit: Payer: Self-pay

## 2023-02-12 DIAGNOSIS — F3132 Bipolar disorder, current episode depressed, moderate: Secondary | ICD-10-CM | POA: Diagnosis not present

## 2023-02-13 ENCOUNTER — Telehealth: Payer: Self-pay

## 2023-02-13 NOTE — Progress Notes (Signed)
   02/13/2023  Patient ID: Matthew Barnes, male   DOB: [birth year], 67 y.o.   MRN: [id number]  The patients has a diagnosis of hypertension and it is medically necessary for them to have access to a home device to monitor blood pressure.  The patient does not have readily available insurance access to a device and cannot afford to purchase a device at this time.  The patient has been counseled that they do not need to continue to receive services from [facility] to receive a device.  The patient will be given a device free of charge.   Sherrill Raring, PharmD Clinical Pharmacist [phone]

## 2023-02-17 ENCOUNTER — Encounter: Payer: Self-pay | Admitting: Family Medicine

## 2023-02-17 ENCOUNTER — Other Ambulatory Visit (HOSPITAL_BASED_OUTPATIENT_CLINIC_OR_DEPARTMENT_OTHER): Payer: Self-pay

## 2023-02-17 ENCOUNTER — Ambulatory Visit: Payer: Medicare HMO | Admitting: Endocrinology

## 2023-02-17 ENCOUNTER — Ambulatory Visit (INDEPENDENT_AMBULATORY_CARE_PROVIDER_SITE_OTHER): Payer: Medicare HMO | Admitting: Family Medicine

## 2023-02-17 VITALS — BP 120/78 | HR 84 | Temp 98.1°F | Wt 279.0 lb

## 2023-02-17 DIAGNOSIS — R197 Diarrhea, unspecified: Secondary | ICD-10-CM

## 2023-02-17 MED ORDER — DIPHENOXYLATE-ATROPINE 2.5-0.025 MG PO TABS
2.0000 | ORAL_TABLET | Freq: Four times a day (QID) | ORAL | 0 refills | Status: DC | PRN
  Filled 2023-02-17: qty 60, 8d supply, fill #0

## 2023-02-17 NOTE — Progress Notes (Signed)
   Subjective:    Patient ID: Matthew Barnes, male    DOB: 1955/07/16, 67 y.o.   MRN: [id number]  HPI Here for one year of urgent stools and loose stools. He averages 1-2 stools a day. Sometimes he cannot get to a bathroom before he goes in his pants. He has never had bloody or black stools. No abdominal pain. No nausea or vomiting. He takes Omeprazole every morning for GERD symptoms, and this works well. No fevers. He has been taking Mounjaro shots for the past 3 months. He has never had a colonoscopy. He had a negative Cologuard test about 3 years ago    Review of Systems  Constitutional: Negative.   Respiratory: Negative.    Cardiovascular: Negative.   Gastrointestinal:  Positive for diarrhea. Negative for abdominal distention, abdominal pain, anal bleeding, blood in stool, constipation, nausea, rectal pain and vomiting.  Genitourinary: Negative.        Objective:   Physical Exam Constitutional:      Appearance: Normal appearance.  Cardiovascular:     Rate and Rhythm: Normal rate and regular rhythm.     Pulses: Normal pulses.     Heart sounds: Normal heart sounds.  Pulmonary:     Effort: Pulmonary effort is normal.     Breath sounds: Normal breath sounds.  Abdominal:     General: Abdomen is flat. Bowel sounds are normal. There is no distension.     Palpations: Abdomen is soft. There is no mass.     Tenderness: There is no abdominal tenderness. There is no right CVA tenderness, left CVA tenderness, guarding or rebound.     Hernia: No hernia is present.  Neurological:     Mental Status: He is alert.           Assessment & Plan:  Urgent loose stools. Possibilities include IBS or some type of colitis. He can try Lomotil as needed. Refer to GI to evaluate.  Gershon Crane, MD

## 2023-02-18 ENCOUNTER — Other Ambulatory Visit: Payer: Self-pay

## 2023-02-20 ENCOUNTER — Other Ambulatory Visit: Payer: Self-pay

## 2023-02-21 ENCOUNTER — Other Ambulatory Visit (HOSPITAL_COMMUNITY): Payer: Self-pay

## 2023-02-21 ENCOUNTER — Other Ambulatory Visit (HOSPITAL_BASED_OUTPATIENT_CLINIC_OR_DEPARTMENT_OTHER): Payer: Self-pay

## 2023-02-24 ENCOUNTER — Other Ambulatory Visit: Payer: Self-pay

## 2023-02-24 ENCOUNTER — Ambulatory Visit: Payer: Medicare HMO

## 2023-02-24 DIAGNOSIS — I5042 Chronic combined systolic (congestive) and diastolic (congestive) heart failure: Secondary | ICD-10-CM

## 2023-02-24 DIAGNOSIS — I420 Dilated cardiomyopathy: Secondary | ICD-10-CM

## 2023-02-25 ENCOUNTER — Other Ambulatory Visit: Payer: Medicare HMO

## 2023-02-25 LAB — CUP PACEART REMOTE DEVICE CHECK
Battery Remaining Longevity: 124 mo
Battery Voltage: 3 V
Brady Statistic RV Percent Paced: 0.01 %
Date Time Interrogation Session: 20241127012306
HighPow Impedance: 76 Ohm
Implantable Lead Connection Status: 753985
Implantable Lead Implant Date: 20230220
Implantable Lead Location: 753860
Implantable Lead Model: 6935
Implantable Pulse Generator Implant Date: 20230220
Lead Channel Impedance Value: 513 Ohm
Lead Channel Impedance Value: 608 Ohm
Lead Channel Pacing Threshold Amplitude: 0.5 V
Lead Channel Pacing Threshold Pulse Width: 0.4 ms
Lead Channel Sensing Intrinsic Amplitude: 8.375 mV
Lead Channel Sensing Intrinsic Amplitude: 8.375 mV
Lead Channel Setting Pacing Amplitude: 2 V
Lead Channel Setting Pacing Pulse Width: 0.4 ms
Lead Channel Setting Sensing Sensitivity: 0.45 mV
Zone Setting Status: 755011
Zone Setting Status: 755011

## 2023-02-25 NOTE — Progress Notes (Deleted)
   02/25/2023  Patient ID: Matthew Barnes, male   DOB: [birth year], 67 y.o.   MRN: [id number]  Attempted to contact patient for scheduled appointment for medication management. Left HIPAA compliant message for patient to return my call at their convenience.   Sherrill Raring, PharmD Clinical Pharmacist [phone]

## 2023-03-02 ENCOUNTER — Telehealth: Payer: Self-pay

## 2023-03-02 NOTE — Progress Notes (Unsigned)
   03/02/2023  Patient ID: Matthew Barnes, male   DOB: February 02, 1956, 66 y.o.   MRN: [id number]  Patient picked up provided BP Kit from office but has not started using, states he needs to get batteries. Plans to pick up batteries today to make sure machine works.  Will call me back tomorrow  (12/3) to follow up and schedule BP follow.  Sherrill Raring, PharmD Clinical Pharmacist [phone]

## 2023-03-04 NOTE — Telephone Encounter (Signed)
Reached patient, he will pickup device tomorrow. Follow up scheduled for 03/11/23 at 8:30am  Sherrill Raring, PharmD Clinical Pharmacist [phone]

## 2023-03-04 NOTE — Telephone Encounter (Signed)
Patient states he cannot get BP kit working. Will not turn on. Requesting a new kit. Provided at front office. Attempted to notify patient but he was not free to speak. Missed his return call. Returned the call and was unable to leave a message.  Sherrill Raring, PharmD Clinical Pharmacist [phone]

## 2023-03-11 ENCOUNTER — Other Ambulatory Visit: Payer: Medicare HMO

## 2023-03-11 VITALS — BP 133/80

## 2023-03-11 DIAGNOSIS — I10 Essential (primary) hypertension: Secondary | ICD-10-CM

## 2023-03-11 DIAGNOSIS — E1140 Type 2 diabetes mellitus with diabetic neuropathy, unspecified: Secondary | ICD-10-CM

## 2023-03-11 NOTE — Progress Notes (Signed)
03/11/2023 Name: Matthew Barnes MRN: [id number] DOB: [birth year]  Chief Complaint  Patient presents with   Hypertension    Matthew Barnes is a 67 y.o. year old male who presented for a telephone visit.   They were referred to the pharmacist by their PCP for assistance in managing diabetes, hypertension, and complex medication management.    Subjective:  Care Team: Primary Care Provider: Karie Georges, MD   Medication Access/Adherence  [facility] [address] [place], [place] - [address] [place] [address] Phone: [phone] Fax: [phone]  [facility] [address] [place] [address] Phone: [phone] Fax: [phone]   Patient reports affordability concerns with their medications: No  Patient reports access/transportation concerns to their pharmacy: No  Patient reports adherence concerns with their medications:  No     Hypertension:  Current medications: Carvedilol 6.25mg  BID, Lasix 20mg  daily, Losartan 50mg  daily Medications previously tried: Amlodipine, HCTZ  Patient has a validated, automated, upper arm home BP cuff Current blood pressure readings readings: 133/80 yesterday  Patient denies hypotensive s/sx including dizziness, lightheadedness.  Patient denies hypertensive symptoms including headache, chest pain, shortness of breath  Current meal patterns: mindful of salt intake   Objective:  Lab Results  Component Value Date   HGBA1C 6.2 10/28/2022    Lab Results  Component Value Date   CREATININE 1.50 10/28/2022   BUN 13 10/28/2022   NA 139 10/28/2022   K 4.2 10/28/2022   CL 102 10/28/2022   CO2 31 10/28/2022    Lab Results  Component Value Date   CHOL 96 11/07/2022   HDL 22.00 (L) 11/07/2022   LDLCALC 38 10/28/2021   LDLDIRECT 53.0 11/07/2022   TRIG 257.0 (H) 11/07/2022   CHOLHDL 4 11/07/2022    Medications Reviewed Today      Reviewed by Sherrill Raring, RPH (Pharmacist) on 03/11/23 at 0845  Med List Status: <None>   Medication Order Taking? Sig Documenting Provider Last Dose Status Informant  ACCU-CHEK GUIDE test strip [id number] Yes USE TO check blood glucose twice daily AS DIRECTED Reather Littler, MD Taking Active   Accu-Chek Softclix Lancets lancets [id number] Yes Check blood sugar 2 times daily Reather Littler, MD Taking Active   atorvastatin (LIPITOR) 20 MG tablet [id number] Yes Take 1 tablet (20 mg total) by mouth daily at bedtime Karie Georges, MD Taking Active   bacitracin 500 UNIT/GM ointment [id number]  Apply 1 Application topically 2 (two) times daily. Sherian Maroon A, PA  Active   Blood Glucose Monitoring Suppl (ACCU-CHEK GUIDE) w/Device KIT [id number]  Check blood sugar 2 times daily Reather Littler, MD  Active   carvedilol (COREG) 6.25 MG tablet [id number] Yes TAKE ONE TABLET BY MOUTH AT [place] AND AT BEDTIME Karie Georges, MD Taking Active   carvedilol (COREG) 6.25 MG tablet [id number]  Take 1 tablet (6.25 mg total) by mouth 2 (two) times daily at breakfast and at bedtime. Karie Georges, MD  Active   digoxin (LANOXIN) 0.125 MG tablet [id number] Yes Take 1 tablet (0.125 mg total) by mouth at bedtime. Karie Georges, MD Taking Active   diphenoxylate-atropine (LOMOTIL) 2.5-0.025 MG tablet [id number] Yes Take 2 tablets by mouth 4 (four) times daily as needed for diarrhea or loose stools. Nelwyn Salisbury, MD Taking Active   furosemide (LASIX) 20 MG tablet [id number] Yes Take 1 tablet (20 mg total) by mouth every evening. Karie Georges, MD Taking  Active   furosemide (LASIX) 20 MG tablet [id number] Yes Take 1 tablet (20 mg total) by mouth every evening. Karie Georges, MD Taking Active   glucose blood (ACCU-CHEK GUIDE) test strip [id number]  Use to check blood sugar 2 times daily   Active   insulin aspart protamine - aspart (NOVOLOG 70/30 MIX) (70-30) 100 UNIT/ML FlexPen [id number] Yes Inject 40  Units into the skin in the morning with a meal Thapa, [place], MD Taking Active   insulin isophane & regular human KwikPen (HUMULIN 70/30 KWIKPEN) (70-30) 100 UNIT/ML KwikPen [id number]  Inject 40 Units into the skin daily with breakfast. Thapa, [place], MD  Active   insulin isophane & regular human KwikPen (NOVOLIN 70/30 KWIKPEN) (70-30) 100 UNIT/ML KwikPen [id number]  Inject 40 Units into the skin in the morning with a meal Thapa, [place], MD  Active   Insulin Pen Needle (COMFORT EZ PEN NEEDLES) 31G X 6 MM MISC [id number]  USE AS DIRECTED TWICE DAILY Reather Littler, MD  Active   Insulin Syringe-Needle U-100 (INSULIN SYRINGE 1CC/31GX5/16") 31G X 5/16" 1 ML MISC [id number]  Use 3 needles per day Reather Littler, MD  Active   losartan (COZAAR) 50 MG tablet [id number] Yes Take 1 tablet (50 mg total) by mouth daily. Karie Georges, MD Taking Active   losartan (COZAAR) 50 MG tablet [id number]  Take 1 tablet (50 mg total) by mouth at bedtime. Karie Georges, MD  Active   meclizine (ANTIVERT) 25 MG tablet [id number]  Take 0.5-1 tablets (12.5-25 mg total) by mouth 3 (three) times daily as needed for dizziness. Karie Georges, MD  Active   omeprazole (PRILOSEC) 20 MG capsule [id number] Yes Take 1 capsule (20 mg total) by mouth daily. Karie Georges, MD Taking Active   QUEtiapine (SEROQUEL) 200 MG tablet [id number] Yes Take 600 mg by mouth at bedtime. Per patient taking 500 mg [provider] Taking Active Self  QUEtiapine (SEROQUEL) 200 MG tablet [id number]  Take 3 tablets (600 mg total) by mouth at bedtime.   Active   QUEtiapine (SEROQUEL) 200 MG tablet [id number]  Take 2-3 tablets (400-600 mg total) by mouth every evening.   Active   sertraline (ZOLOFT) 100 MG tablet [id number]  Take 100 mg by mouth daily. [provider]  Active   sertraline (ZOLOFT) 100 MG tablet [id number]  Take 1 tablet (100 mg total) by mouth daily.   Active   tadalafil (CIALIS) 20 MG tablet [id number]  Take 0.5-1 tablets (10-20  mg total) by mouth every other day as needed for erectile dysfunction. Karie Georges, MD  Active   tirzepatide [facility]) 10 MG/0.5ML Pen [id number]  Inject 10 mg into the skin once a week.  Patient not taking: Reported on 02/17/2023     Active   tirzepatide [facility]) 12.5 MG/0.5ML Pen [id number]  Inject 12.5 mg into the skin once a week. Thapa, [place], MD  Active   tirzepatide [facility]) 12.5 MG/0.5ML Pen [id number] Yes Inject 12.5 mg into the skin once a week. Thapa, [place], MD Taking Active               Assessment/Plan:   Hypertension: - Currently controlled - Reviewed long term cardiovascular and renal outcomes of uncontrolled blood pressure - Reviewed appropriate blood pressure monitoring technique and reviewed goal blood pressure. Recommended to check home blood pressure and heart rate at least once weekly - Recommend to continue current medication therapy     Follow Up Plan: 3 months  Sherrill Raring, PharmD  Clinical Pharmacist [phone]

## 2023-03-13 ENCOUNTER — Telehealth: Payer: Self-pay

## 2023-03-13 NOTE — Telephone Encounter (Signed)
Contacted patient on preferred number listed in notes for scheduled AWV. Patient stated unable to complete visit today will call back to reschedule.

## 2023-03-18 ENCOUNTER — Other Ambulatory Visit (HOSPITAL_BASED_OUTPATIENT_CLINIC_OR_DEPARTMENT_OTHER): Payer: Self-pay

## 2023-03-18 ENCOUNTER — Other Ambulatory Visit: Payer: Self-pay | Admitting: Endocrinology

## 2023-03-18 ENCOUNTER — Other Ambulatory Visit: Payer: Self-pay

## 2023-03-18 ENCOUNTER — Other Ambulatory Visit: Payer: Self-pay | Admitting: Family Medicine

## 2023-03-18 ENCOUNTER — Other Ambulatory Visit (HOSPITAL_COMMUNITY): Payer: Self-pay

## 2023-03-18 DIAGNOSIS — E1165 Type 2 diabetes mellitus with hyperglycemia: Secondary | ICD-10-CM

## 2023-03-18 MED ORDER — CARVEDILOL 6.25 MG PO TABS
6.2500 mg | ORAL_TABLET | Freq: Two times a day (BID) | ORAL | 0 refills | Status: DC
  Filled 2023-03-18 (×2): qty 180, 90d supply, fill #0
  Filled 2023-03-19: qty 60, 30d supply, fill #0
  Filled 2023-04-06 – 2023-04-22 (×2): qty 60, 30d supply, fill #1
  Filled 2023-05-11 – 2023-05-18 (×2): qty 60, 30d supply, fill #2
  Filled ????-??-??: fill #0
  Filled ????-??-??: fill #1

## 2023-03-18 MED ORDER — SERTRALINE HCL 100 MG PO TABS
100.0000 mg | ORAL_TABLET | Freq: Every day | ORAL | 1 refills | Status: DC
  Filled 2023-03-18: qty 90, 90d supply, fill #0
  Filled 2023-11-03 – 2023-11-05 (×2): qty 30, 30d supply, fill #0
  Filled 2023-12-09: qty 30, 30d supply, fill #1
  Filled 2024-01-07: qty 30, 30d supply, fill #2
  Filled 2024-02-08: qty 30, 30d supply, fill #3
  Filled ????-??-??: fill #0

## 2023-03-18 MED ORDER — HUMULIN 70/30 KWIKPEN (70-30) 100 UNIT/ML ~~LOC~~ SUPN
40.0000 [IU] | PEN_INJECTOR | Freq: Every day | SUBCUTANEOUS | 0 refills | Status: DC
  Filled 2023-03-18: qty 36, 90d supply, fill #0

## 2023-03-18 MED ORDER — SERTRALINE HCL 100 MG PO TABS
100.0000 mg | ORAL_TABLET | Freq: Every day | ORAL | 2 refills | Status: DC
  Filled 2023-03-18 (×2): qty 90, 90d supply, fill #0
  Filled 2023-03-19: qty 30, 30d supply, fill #0
  Filled 2023-04-06 – 2023-04-22 (×2): qty 30, 30d supply, fill #1
  Filled 2023-05-11 – 2023-05-18 (×2): qty 30, 30d supply, fill #2
  Filled 2023-06-04 – 2023-06-11 (×2): qty 30, 30d supply, fill #3
  Filled 2023-06-19 – 2023-07-07 (×2): qty 30, 30d supply, fill #4
  Filled 2023-07-21 – 2023-07-30 (×2): qty 30, 30d supply, fill #5
  Filled 2023-08-17 – 2023-09-08 (×3): qty 30, 30d supply, fill #6
  Filled 2023-09-28 – 2023-10-08 (×2): qty 30, 30d supply, fill #7
  Filled ????-??-??: fill #1

## 2023-03-18 NOTE — Telephone Encounter (Signed)
insulin refill request complete,

## 2023-03-19 ENCOUNTER — Other Ambulatory Visit: Payer: Self-pay

## 2023-03-19 ENCOUNTER — Other Ambulatory Visit (HOSPITAL_BASED_OUTPATIENT_CLINIC_OR_DEPARTMENT_OTHER): Payer: Self-pay

## 2023-03-26 NOTE — Progress Notes (Signed)
Remote ICD transmission.   

## 2023-03-26 NOTE — Addendum Note (Signed)
Addended by: Geralyn Flash D on: 03/26/2023 04:23 PM   Modules accepted: Orders

## 2023-03-30 ENCOUNTER — Other Ambulatory Visit (HOSPITAL_BASED_OUTPATIENT_CLINIC_OR_DEPARTMENT_OTHER): Payer: Self-pay

## 2023-04-02 ENCOUNTER — Other Ambulatory Visit (HOSPITAL_COMMUNITY): Payer: Self-pay

## 2023-04-02 ENCOUNTER — Other Ambulatory Visit (HOSPITAL_BASED_OUTPATIENT_CLINIC_OR_DEPARTMENT_OTHER): Payer: Self-pay

## 2023-04-03 ENCOUNTER — Other Ambulatory Visit (HOSPITAL_BASED_OUTPATIENT_CLINIC_OR_DEPARTMENT_OTHER): Payer: Self-pay

## 2023-04-06 ENCOUNTER — Other Ambulatory Visit: Payer: Self-pay

## 2023-04-07 ENCOUNTER — Other Ambulatory Visit (HOSPITAL_BASED_OUTPATIENT_CLINIC_OR_DEPARTMENT_OTHER): Payer: Self-pay

## 2023-04-08 ENCOUNTER — Other Ambulatory Visit: Payer: Self-pay

## 2023-04-08 ENCOUNTER — Encounter: Payer: Self-pay | Admitting: Family Medicine

## 2023-04-08 ENCOUNTER — Other Ambulatory Visit (HOSPITAL_BASED_OUTPATIENT_CLINIC_OR_DEPARTMENT_OTHER): Payer: Self-pay

## 2023-04-09 ENCOUNTER — Other Ambulatory Visit (HOSPITAL_BASED_OUTPATIENT_CLINIC_OR_DEPARTMENT_OTHER): Payer: Self-pay

## 2023-04-10 ENCOUNTER — Other Ambulatory Visit (HOSPITAL_BASED_OUTPATIENT_CLINIC_OR_DEPARTMENT_OTHER): Payer: Self-pay

## 2023-04-13 ENCOUNTER — Other Ambulatory Visit: Payer: Self-pay

## 2023-04-17 ENCOUNTER — Telehealth: Payer: Self-pay

## 2023-04-17 ENCOUNTER — Other Ambulatory Visit: Payer: Self-pay

## 2023-04-17 ENCOUNTER — Telehealth: Payer: Self-pay | Admitting: Endocrinology

## 2023-04-17 ENCOUNTER — Other Ambulatory Visit (HOSPITAL_BASED_OUTPATIENT_CLINIC_OR_DEPARTMENT_OTHER): Payer: Self-pay

## 2023-04-17 NOTE — Telephone Encounter (Signed)
PA needed for Mounjaro.  

## 2023-04-17 NOTE — Telephone Encounter (Signed)
Patients insurance changed to [employer] and per the pharmacy the [place] needs a Prior Authorization

## 2023-04-20 ENCOUNTER — Telehealth: Payer: Self-pay

## 2023-04-20 ENCOUNTER — Other Ambulatory Visit (HOSPITAL_BASED_OUTPATIENT_CLINIC_OR_DEPARTMENT_OTHER): Payer: Self-pay

## 2023-04-20 NOTE — Telephone Encounter (Signed)
Pharmacy Patient Advocate Encounter   Received notification from Pt Calls Messages that prior authorization for Matthew Barnes is required/requested.   Insurance verification completed.   The patient is insured through [facility] .   Per test claim: PA required; PA submitted to above mentioned insurance via CoverMyMeds Key/confirmation #/EOC [id number] Status is pending

## 2023-04-21 ENCOUNTER — Other Ambulatory Visit (HOSPITAL_BASED_OUTPATIENT_CLINIC_OR_DEPARTMENT_OTHER): Payer: Self-pay

## 2023-04-21 DIAGNOSIS — M9905 Segmental and somatic dysfunction of pelvic region: Secondary | ICD-10-CM | POA: Diagnosis not present

## 2023-04-21 DIAGNOSIS — M9901 Segmental and somatic dysfunction of cervical region: Secondary | ICD-10-CM | POA: Diagnosis not present

## 2023-04-21 DIAGNOSIS — M9903 Segmental and somatic dysfunction of lumbar region: Secondary | ICD-10-CM | POA: Diagnosis not present

## 2023-04-21 DIAGNOSIS — M9904 Segmental and somatic dysfunction of sacral region: Secondary | ICD-10-CM | POA: Diagnosis not present

## 2023-04-21 NOTE — Telephone Encounter (Signed)
Pharmacy Patient Advocate Encounter  Received notification from [facility] that Prior Authorization for Matthew Barnes has been APPROVED through 03/30/2024   PA #/Case ID/Reference #: [id number]

## 2023-04-22 ENCOUNTER — Other Ambulatory Visit (HOSPITAL_BASED_OUTPATIENT_CLINIC_OR_DEPARTMENT_OTHER): Payer: Self-pay

## 2023-04-22 ENCOUNTER — Telehealth (INDEPENDENT_AMBULATORY_CARE_PROVIDER_SITE_OTHER): Payer: Medicare Other | Admitting: Endocrinology

## 2023-04-22 ENCOUNTER — Other Ambulatory Visit: Payer: Self-pay

## 2023-04-22 ENCOUNTER — Encounter: Payer: Self-pay | Admitting: Endocrinology

## 2023-04-22 DIAGNOSIS — Z7985 Long-term (current) use of injectable non-insulin antidiabetic drugs: Secondary | ICD-10-CM | POA: Diagnosis not present

## 2023-04-22 DIAGNOSIS — Z794 Long term (current) use of insulin: Secondary | ICD-10-CM | POA: Diagnosis not present

## 2023-04-22 DIAGNOSIS — E1165 Type 2 diabetes mellitus with hyperglycemia: Secondary | ICD-10-CM | POA: Diagnosis not present

## 2023-04-22 MED ORDER — TIRZEPATIDE 15 MG/0.5ML ~~LOC~~ SOAJ
15.0000 mg | SUBCUTANEOUS | 4 refills | Status: DC
Start: 2023-04-22 — End: 2023-05-14

## 2023-04-22 NOTE — Progress Notes (Signed)
I connected with  Elder Negus on 04/22/23 by a video enabled telemedicine application and verified that I am speaking with the correct person using two identifiers.   I discussed the limitations of evaluation and management by telemedicine. The patient expressed understanding and agreed to proceed.   Provider location : Office Patient location : At [facility], in the car.  Outpatient Endocrinology Note [place] Aiysha Jillson, MD  04/22/23  Patient's Name: Matthew Barnes    DOB: December 25, 1955    MRN: [id number]                                                    REASON OF VISIT: Follow up of type 2 diabetes mellitus  PCP: Karie Georges, MD  HISTORY OF PRESENT ILLNESS:   JEREK GUZZETTA is a 68 y.o. old male with past medical history listed below, is here for follow up  type 2 diabetes mellitus.   Pertinent Diabetes History: He was diagnosed with type 2 diabetes mellitus in 2011.  He was initially managed with metformin and later added on insulin therapy due to poor control he was on Lantus until 2015.  Around 2015 he was sent to Humalog mix insulin along with Victoza to help with weight loss.  Chronic Diabetes Complications : Retinopathy: no. Last ophthalmology exam was done on annually. Nephropathy: no, on losartan. Peripheral neuropathy: no Coronary artery disease: no Stroke: no  Relevant comorbidities and cardiovascular risk factors: Obesity: yes There is no height or weight on file to calculate BMI.  Hypertension: yes Hyperlipidemia. Yes, on statin  Current / Home Diabetic regimen includes:  Mounjaro 12.5 mg weekly. Novolin 70/30 : 40 units daily in the morning, until recently and stopped about 1 week ago.  Greggory Keen was started around March/April 2024.  Prior diabetic medications: Lantus, Levemir, Victoza, metformin in the past.  Glycemic data:  He repeated blood sugar as follows Fasting : 110, 114, 140, after meals: 160, 170  Freestyle libre/CGM/Dexcom was prescribed in the  past patient reports not covered, not able to get it.  Hypoglycemia: Patient has no hypoglycemic episodes. Patient has hypoglycemia awareness.  Factors modifying glucose control: 1.  Diabetic diet assessment: Has been eating healthy, avoiding high carbohydrate meals.  2.  Staying active or exercising: Regular exercise/biking.  3.  Medication compliance: compliant all of the time.  Interval history  MyChart virtual visit today.  Patient reports she has been taking Mounjaro 2.5 mg weekly, tolerating well, no GI issues.  He stopped taking insulin about 1 week ago, until that time he was taking 40 units daily in the morning.  He felt like insulin may not be needed.  He denies hypoglycemia.  No other complaints today.   REVIEW OF SYSTEMS As per history of present illness.   PAST MEDICAL HISTORY: Past Medical History:  Diagnosis Date   Bipolar II disorder - Managed by Jacqulyn Ducking [phone]) 05/03/2013   Diabetes mellitus    Dysphagia    Hyperlipidemia    Hypertension    Unspecified hypothyroidism 05/03/2013    PAST SURGICAL HISTORY: Past Surgical History:  Procedure Laterality Date   ICD IMPLANT N/A 05/20/2021   Procedure: ICD IMPLANT;  Surgeon: Marinus Maw, MD;  Location: [facility] INVASIVE CV LAB;  Service: Cardiovascular;  Laterality: N/A;   KNEE SURGERY     scope on  left knee   RIGHT/LEFT HEART CATH AND CORONARY ANGIOGRAPHY N/A 08/07/2020   Procedure: RIGHT/LEFT HEART CATH AND CORONARY ANGIOGRAPHY;  Surgeon: Marykay Lex, MD;  Location: [facility] INVASIVE CV LAB;  Service: Cardiovascular;  Laterality: N/A;    ALLERGIES: Allergies  Allergen Reactions   Influenza Vaccines Swelling and Other (See Comments)    Reports of swelling of the arm and then was "sick" for 10 days    FAMILY HISTORY:  Family History  Problem Relation Age of Onset   Diabetes Mother    Hypertension Mother    Heart disease Mother    Heart disease Father    Heart attack Father 65   Diabetes Father     Hypertension Father    Depression Father    Diabetes Sister    Depression Paternal Grandmother    Depression Other     SOCIAL HISTORY: Social History   Socioeconomic History   Marital status: Single    Spouse name: Not on file   Number of children: Not on file   Years of education: Not on file   Highest education level: GED or equivalent  Occupational History   Not on file  Tobacco Use   Smoking status: Never   Smokeless tobacco: Never  Vaping Use   Vaping status: Never Used  Substance and Sexual Activity   Alcohol use: No   Drug use: No   Sexual activity: Not Currently  Other Topics Concern   Not on file  Social History Narrative   Work or School: on disability for knee arthritis      Home Situation: lives alone      Spiritual Beliefs:Christian      Lifestyle:no regular exercising; diet not great            Social Drivers of Health   Financial Resource Strain: Low Risk  (01/02/2023)   Overall Financial Resource Strain (CARDIA)    Difficulty of Paying Living Expenses: Not hard at all  Food Insecurity: No Food Insecurity (01/02/2023)   Hunger Vital Sign    Worried About Running Out of Food in the Last Year: Never true    Ran Out of Food in the Last Year: Never true  Transportation Needs: No Transportation Needs (01/02/2023)   PRAPARE - Administrator, Civil Service (Medical): No    Lack of Transportation (Non-Medical): No  Physical Activity: Sufficiently Active (01/02/2023)   Exercise Vital Sign    Days of Exercise per Week: 5 days    Minutes of Exercise per Session: 30 min  Stress: No Stress Concern Present (01/02/2023)   [employer] of Occupational Health - Occupational Stress Questionnaire    Feeling of Stress : Not at all  Social Connections: Socially Isolated (01/02/2023)   Social Connection and Isolation Panel [NHANES]    Frequency of Communication with Friends and Family: Twice a week    Frequency of Social Gatherings with Friends  and Family: Once a week    Attends Religious Services: Never    Database administrator or Organizations: No    Attends Engineer, structural: Not on file    Marital Status: Divorced    MEDICATIONS:  Current Outpatient Medications  Medication Sig Dispense Refill   tirzepatide (MOUNJARO) 15 MG/0.5ML Pen Inject 15 mg into the skin once a week. 6 mL 4   ACCU-CHEK GUIDE test strip USE TO check blood glucose twice daily AS DIRECTED 100 strip 3   Accu-Chek Softclix Lancets lancets Check blood sugar  2 times daily 100 each 3   atorvastatin (LIPITOR) 20 MG tablet Take 1 tablet (20 mg total) by mouth daily at bedtime 90 tablet 1   bacitracin 500 UNIT/GM ointment Apply 1 Application topically 2 (two) times daily. 120 g 0   Blood Glucose Monitoring Suppl (ACCU-CHEK GUIDE) w/Device KIT Check blood sugar 2 times daily 1 kit 0   carvedilol (COREG) 6.25 MG tablet TAKE ONE TABLET BY MOUTH AT BREAKFAST AND AT BEDTIME 180 tablet 1   carvedilol (COREG) 6.25 MG tablet Take 1 tablet (6.25 mg total) by mouth 2 (two) times daily at breakfast and at bedtime. 180 tablet 0   digoxin (LANOXIN) 0.125 MG tablet Take 1 tablet (0.125 mg total) by mouth at bedtime. 90 tablet 3   diphenoxylate-atropine (LOMOTIL) 2.5-0.025 MG tablet Take 2 tablets by mouth 4 (four) times daily as needed for diarrhea or loose stools. 60 tablet 0   furosemide (LASIX) 20 MG tablet Take 1 tablet (20 mg total) by mouth every evening. 90 tablet 1   furosemide (LASIX) 20 MG tablet Take 1 tablet (20 mg total) by mouth every evening. 90 tablet 1   glucose blood (ACCU-CHEK GUIDE) test strip Use to check blood sugar 2 times daily 100 each 3   insulin aspart protamine - aspart (NOVOLOG 70/30 MIX) (70-30) 100 UNIT/ML FlexPen Inject 40 Units into the skin in the morning with a meal 15 mL 4   insulin isophane & regular human KwikPen (HUMULIN 70/30 KWIKPEN) (70-30) 100 UNIT/ML KwikPen Inject 40 Units into the skin daily with breakfast. 45 mL 0    insulin isophane & regular human KwikPen (NOVOLIN 70/30 KWIKPEN) (70-30) 100 UNIT/ML KwikPen Inject 40 Units into the skin in the morning with a meal 15 mL 5   Insulin Pen Needle (COMFORT EZ PEN NEEDLES) 31G X 6 MM MISC USE AS DIRECTED TWICE DAILY 100 each 2   Insulin Syringe-Needle U-100 (INSULIN SYRINGE 1CC/31GX5/16") 31G X 5/16" 1 ML MISC Use 3 needles per day 100 each 3   losartan (COZAAR) 50 MG tablet Take 1 tablet (50 mg total) by mouth daily. 90 tablet 1   losartan (COZAAR) 50 MG tablet Take 1 tablet (50 mg total) by mouth at bedtime. 90 tablet 1   meclizine (ANTIVERT) 25 MG tablet Take 0.5-1 tablets (12.5-25 mg total) by mouth 3 (three) times daily as needed for dizziness. 30 tablet 2   omeprazole (PRILOSEC) 20 MG capsule Take 1 capsule (20 mg total) by mouth daily. 90 capsule 1   QUEtiapine (SEROQUEL) 200 MG tablet Take 600 mg by mouth at bedtime. Per patient taking 500 mg     QUEtiapine (SEROQUEL) 200 MG tablet Take 3 tablets (600 mg total) by mouth at bedtime. 270 tablet 2   QUEtiapine (SEROQUEL) 200 MG tablet Take 2-3 tablets by mouth at bedtime as directed 270 tablet 2   sertraline (ZOLOFT) 100 MG tablet Take 1 tablet (100 mg total) by mouth daily. 90 tablet 1   sertraline (ZOLOFT) 100 MG tablet Take 1 tablet (100 mg total) by mouth daily. 90 tablet 2   tadalafil (CIALIS) 20 MG tablet Take 0.5-1 tablets (10-20 mg total) by mouth every other day as needed for erectile dysfunction. 10 tablet 3   No current facility-administered medications for this visit.    PHYSICAL EXAM: There were no vitals filed for this visit.  There is no height or weight on file to calculate BMI.  Wt Readings from Last 3 Encounters:  02/17/23 279 lb (126.6  kg)  12/10/22 280 lb (127 kg)  11/07/22 280 lb 4.8 oz (127.1 kg)    General: Well developed, well nourished male in no apparent distress.  HEENT: AT/[place], no external lesions.  Eyes: Conjunctiva clear and no icterus. Neurologic: Alert, oriented, normal  speech Psychiatric: Does not appear depressed or anxious  Diabetic Foot Exam - Simple   No data filed     LABS Reviewed Lab Results  Component Value Date   HGBA1C 6.2 10/28/2022   HGBA1C 7.0 (H) 08/01/2022   HGBA1C 8.2 (H) 05/19/2022   Lab Results  Component Value Date   FRUCTOSAMINE 305 (H) 01/30/2022   FRUCTOSAMINE 290 (H) 08/15/2021   FRUCTOSAMINE 301 (H) 06/18/2021   Lab Results  Component Value Date   CHOL 96 11/07/2022   HDL 22.00 (L) 11/07/2022   LDLCALC 38 10/28/2021   LDLDIRECT 53.0 11/07/2022   TRIG 257.0 (H) 11/07/2022   CHOLHDL 4 11/07/2022   Lab Results  Component Value Date   MICRALBCREAT 1.3 08/01/2022   MICRALBCREAT 3.0 02/28/2021   Lab Results  Component Value Date   CREATININE 1.50 10/28/2022   Lab Results  Component Value Date   GFR 48.17 (L) 10/28/2022    ASSESSMENT / PLAN  1. Type 2 diabetes mellitus with hyperglycemia, with long-term current use of insulin (HCC)     Diabetes Mellitus type 2, no known complications.  - Diabetic status / severity: Controlled  Lab Results  Component Value Date   HGBA1C 6.2 10/28/2022    - Hemoglobin A1c goal : <7%  - Medications: Adjusted as follows.  I) increase Mounjaro from 12 to 15 mg weekly. II) patient currently not taking Novolin 70/30 stopped about 1 week ago.  He was taking 40 units until then.  Advised to restart Novolin 70/30 at least 20 units in the morning with breakfast.  If he gets hypoglycemia with blood sugar below 70-80 range he can stop insulin altogether. - Home glucose testing: In the morning fasting and at bedtime and as needed. - Discussed/ Gave Hypoglycemia treatment plan.  # Consult : not required at this time.   # Annual urine for microalbuminuria/ creatinine ratio, no microalbuminuria currently, continue ACE/ARB on losartan. Last  Lab Results  Component Value Date   MICRALBCREAT 1.3 08/01/2022    # Foot check nightly.  # Annual dilated diabetic eye exams.   -  Diet: Make healthy diabetic food choices - Life style / activity / exercise: Discussed.  2. Blood pressure  -  BP Readings from Last 1 Encounters:  03/11/23 133/80    - Control is in target.  - No change in current plans.  3. Lipid status / Hyperlipidemia - Last  Lab Results  Component Value Date   LDLCALC 38 10/28/2021   - Continue atorvastatin 20 mg daily.  Diagnoses and all orders for this visit:  Type 2 diabetes mellitus with hyperglycemia, with long-term current use of insulin (HCC) -     Hemoglobin A1c -     BASIC METABOLIC PANEL WITH GFR -     tirzepatide (MOUNJARO) 15 MG/0.5ML Pen; Inject 15 mg into the skin once a week.   Patient is asked to have lab for hemoglobin A1c and BMP at his convenience, in few days.  DISPOSITION Follow up in clinic in 3  months suggested.   All questions answered and patient verbalized understanding of the plan.  [place] Alsha Meland, MD [facility] Endocrinology [facility] [address] [place], [address] [place], [place] [address]  Phone # [phone]  At least part of this note was generated using voice recognition software. Inadvertent word errors may have occurred, which were not recognized during the proofreading process.

## 2023-04-23 ENCOUNTER — Other Ambulatory Visit: Payer: Self-pay

## 2023-04-28 ENCOUNTER — Other Ambulatory Visit (HOSPITAL_BASED_OUTPATIENT_CLINIC_OR_DEPARTMENT_OTHER): Payer: Self-pay

## 2023-04-28 ENCOUNTER — Other Ambulatory Visit: Payer: Self-pay | Admitting: Endocrinology

## 2023-05-07 ENCOUNTER — Other Ambulatory Visit (HOSPITAL_BASED_OUTPATIENT_CLINIC_OR_DEPARTMENT_OTHER): Payer: Self-pay

## 2023-05-11 ENCOUNTER — Other Ambulatory Visit: Payer: Self-pay

## 2023-05-13 ENCOUNTER — Other Ambulatory Visit (HOSPITAL_BASED_OUTPATIENT_CLINIC_OR_DEPARTMENT_OTHER): Payer: Self-pay

## 2023-05-14 ENCOUNTER — Other Ambulatory Visit (HOSPITAL_BASED_OUTPATIENT_CLINIC_OR_DEPARTMENT_OTHER): Payer: Self-pay

## 2023-05-14 ENCOUNTER — Other Ambulatory Visit: Payer: Self-pay

## 2023-05-14 ENCOUNTER — Other Ambulatory Visit: Payer: Self-pay | Admitting: Endocrinology

## 2023-05-14 ENCOUNTER — Telehealth: Payer: Self-pay

## 2023-05-14 DIAGNOSIS — E1165 Type 2 diabetes mellitus with hyperglycemia: Secondary | ICD-10-CM

## 2023-05-14 MED ORDER — TIRZEPATIDE 15 MG/0.5ML ~~LOC~~ SOAJ
15.0000 mg | SUBCUTANEOUS | 4 refills | Status: DC
  Filled 2023-05-14: qty 6, 84d supply, fill #0
  Filled 2023-07-21 – 2023-07-30 (×2): qty 6, 84d supply, fill #1
  Filled 2023-10-19: qty 6, 84d supply, fill #2
  Filled 2023-12-31: qty 6, 84d supply, fill #3

## 2023-05-14 MED ORDER — MOUNJARO 12.5 MG/0.5ML ~~LOC~~ SOAJ
12.5000 mg | SUBCUTANEOUS | 3 refills | Status: DC
  Filled 2023-05-14: qty 6, 84d supply, fill #0

## 2023-05-14 NOTE — Telephone Encounter (Signed)
Mounjaro originally sent to wrong pharmacy. Sent to preferred pharmacy .

## 2023-05-15 ENCOUNTER — Telehealth: Payer: Self-pay | Admitting: Endocrinology

## 2023-05-15 ENCOUNTER — Other Ambulatory Visit (HOSPITAL_BASED_OUTPATIENT_CLINIC_OR_DEPARTMENT_OTHER): Payer: Self-pay

## 2023-05-15 ENCOUNTER — Telehealth: Payer: Self-pay

## 2023-05-15 NOTE — Telephone Encounter (Signed)
Papers left at front desk by patient to be completed and signed by MD.

## 2023-05-15 NOTE — Telephone Encounter (Signed)
New message    Patient at the desk with Chronic Condition Verification form that needs to be completed and fax back before 05/29/23.  Fax # [phone]   Placed in provider folder.

## 2023-05-17 ENCOUNTER — Other Ambulatory Visit (HOSPITAL_BASED_OUTPATIENT_CLINIC_OR_DEPARTMENT_OTHER): Payer: Self-pay

## 2023-05-18 ENCOUNTER — Other Ambulatory Visit (HOSPITAL_BASED_OUTPATIENT_CLINIC_OR_DEPARTMENT_OTHER): Payer: Self-pay

## 2023-05-18 ENCOUNTER — Other Ambulatory Visit: Payer: Self-pay

## 2023-05-19 ENCOUNTER — Other Ambulatory Visit: Payer: Self-pay

## 2023-05-20 ENCOUNTER — Other Ambulatory Visit (HOSPITAL_COMMUNITY): Payer: Self-pay

## 2023-05-22 ENCOUNTER — Ambulatory Visit: Payer: Self-pay | Admitting: Family Medicine

## 2023-05-22 NOTE — Telephone Encounter (Signed)
  Chief Complaint: dizziness Symptoms: dizziness Frequency: ongoing, chronic Pertinent Negatives: Patient denies CP, SOB, injuries, falls Disposition: [] ED /[] Urgent Care (no appt availability in office) / [x] Appointment(In office/virtual)/ []  [facility]/ [] Home Care/ [] Refused Recommended Disposition /[] [facility]/ []  Follow-up with PCP Additional Notes: Patient c/o chronic dizziness. Reports that he has taken meclizine as prescribed and unsure if it has been helping sx. Endorses dizziness when standing up from a seated position that lasts a few seconds. Denies CP, SOB, injuries, falls. Of note, has noticed a bump on the back of his head that he has concerns about. Scheduled patient per protocol on 05/25/2023. Patient verbalized understanding and to call back/911 with worsening symptoms.    Copied from CRM [id number]. Topic: Clinical - Red Word Triage >> May 22, 2023  5:03 PM Denese Killings wrote: Red Word that prompted transfer to Nurse Triage: Patient is experiencing dizziness everytime he stands up. He states that he feels like he's in a fog. Reason for Disposition  [1] MILD dizziness (e.g., walking normally) AND [2] has been evaluated by doctor (or NP/PA) for this  Answer Assessment - Initial Assessment Questions 1. DESCRIPTION: "Describe your dizziness."     "When I stand up from sitting down, I feel like my blood rushes to my head" "I'm looking at someone, but I feel like I'm out of my body" 2. LIGHTHEADED: "Do you feel lightheaded?" (e.g., somewhat faint, woozy, weak upon standing)     Denies feeling of fainting "I just get dizzy and feel like I'm not there" - lasts for a few seconds 3. VERTIGO: "Do you feel like either you or the room is spinning or tilting?" (i.e. vertigo)     no 4. SEVERITY: "How bad is it?"  "Do you feel like you are going to faint?" "Can you stand and walk?"   - MILD: Feels slightly dizzy, but walking normally.   - MODERATE: Feels unsteady  when walking, but not falling; interferes with normal activities (e.g., school, work).   - SEVERE: Unable to walk without falling, or requires assistance to walk without falling; feels like passing out now.      Mild - It goes away when I sit back down 5. ONSET:  "When did the dizziness begin?"     "A while" 6. AGGRAVATING FACTORS: "Does anything make it worse?" (e.g., standing, change in head position)     Standing up from sitting 7. HEART RATE: "Can you tell me your heart rate?" "How many beats in 15 seconds?"  (Note: not all patients can do this)       N/a 8. CAUSE: "What do you think is causing the dizziness?"     I don't know 9. RECURRENT SYMPTOM: "Have you had dizziness before?" If Yes, ask: "When was the last time?" "What happened that time?"     This has been going on for a while now 10. OTHER SYMPTOMS: "Do you have any other symptoms?" (e.g., fever, chest pain, vomiting, diarrhea, bleeding)       No Denies injuries/falls Reports having back of neck/head that is "hard" "it goes from one side of my head to the other side" - it's been there, unknown how long  Protocols used: Dizziness - Lightheadedness-A-AH

## 2023-05-25 ENCOUNTER — Ambulatory Visit (INDEPENDENT_AMBULATORY_CARE_PROVIDER_SITE_OTHER): Payer: Medicare Other | Admitting: Family Medicine

## 2023-05-25 ENCOUNTER — Encounter: Payer: Self-pay | Admitting: Family Medicine

## 2023-05-25 ENCOUNTER — Other Ambulatory Visit (HOSPITAL_BASED_OUTPATIENT_CLINIC_OR_DEPARTMENT_OTHER): Payer: Self-pay

## 2023-05-25 VITALS — BP 122/72 | HR 76 | Temp 98.2°F | Ht 73.0 in | Wt 273.1 lb

## 2023-05-25 DIAGNOSIS — R42 Dizziness and giddiness: Secondary | ICD-10-CM | POA: Diagnosis not present

## 2023-05-25 DIAGNOSIS — K219 Gastro-esophageal reflux disease without esophagitis: Secondary | ICD-10-CM | POA: Diagnosis not present

## 2023-05-25 DIAGNOSIS — I5022 Chronic systolic (congestive) heart failure: Secondary | ICD-10-CM | POA: Diagnosis not present

## 2023-05-25 DIAGNOSIS — I10 Essential (primary) hypertension: Secondary | ICD-10-CM

## 2023-05-25 DIAGNOSIS — E7800 Pure hypercholesterolemia, unspecified: Secondary | ICD-10-CM

## 2023-05-25 DIAGNOSIS — Z1211 Encounter for screening for malignant neoplasm of colon: Secondary | ICD-10-CM

## 2023-05-25 MED ORDER — LOSARTAN POTASSIUM 50 MG PO TABS
50.0000 mg | ORAL_TABLET | Freq: Every day | ORAL | 1 refills | Status: DC
Start: 2023-05-25 — End: 2023-11-03
  Filled 2023-05-25 – 2023-06-04 (×2): qty 90, 90d supply, fill #0
  Filled 2023-06-11: qty 30, 30d supply, fill #0
  Filled 2023-06-11: qty 90, 90d supply, fill #0
  Filled 2023-06-19 – 2023-07-07 (×2): qty 30, 30d supply, fill #1
  Filled 2023-07-21 – 2023-07-30 (×2): qty 30, 30d supply, fill #2
  Filled 2023-08-17 – 2023-09-08 (×3): qty 30, 30d supply, fill #3
  Filled 2023-09-28 – 2023-10-08 (×2): qty 30, 30d supply, fill #4
  Filled ????-??-??: fill #5

## 2023-05-25 MED ORDER — FUROSEMIDE 20 MG PO TABS
20.0000 mg | ORAL_TABLET | Freq: Every evening | ORAL | 1 refills | Status: DC
  Filled 2023-05-25 – 2023-06-11 (×3): qty 90, 90d supply, fill #0
  Filled 2023-06-11: qty 30, 30d supply, fill #0
  Filled 2023-06-19 – 2023-07-07 (×2): qty 30, 30d supply, fill #1
  Filled 2023-07-21 – 2023-07-30 (×2): qty 30, 30d supply, fill #2
  Filled 2023-08-17 – 2023-09-08 (×3): qty 30, 30d supply, fill #3
  Filled 2023-09-28 – 2023-10-08 (×2): qty 30, 30d supply, fill #4
  Filled 2023-11-03 – 2023-11-05 (×3): qty 30, 30d supply, fill #5

## 2023-05-25 MED ORDER — OMEPRAZOLE 20 MG PO CPDR
20.0000 mg | DELAYED_RELEASE_CAPSULE | Freq: Every day | ORAL | 1 refills | Status: DC
  Filled 2023-05-25 – 2023-06-11 (×3): qty 90, 90d supply, fill #0
  Filled 2023-06-11: qty 30, 30d supply, fill #0
  Filled 2023-06-19 – 2023-07-07 (×2): qty 30, 30d supply, fill #1
  Filled 2023-07-21 – 2023-07-30 (×2): qty 30, 30d supply, fill #2
  Filled 2023-08-17 – 2023-09-08 (×3): qty 30, 30d supply, fill #3
  Filled 2023-09-28 – 2023-10-08 (×2): qty 30, 30d supply, fill #4
  Filled 2023-11-03 – 2023-11-05 (×3): qty 30, 30d supply, fill #5

## 2023-05-25 MED ORDER — ATORVASTATIN CALCIUM 20 MG PO TABS
20.0000 mg | ORAL_TABLET | Freq: Every day | ORAL | 1 refills | Status: DC
  Filled 2023-05-25 – 2023-06-04 (×2): qty 90, 90d supply, fill #0
  Filled 2023-06-11: qty 30, 30d supply, fill #0
  Filled 2023-06-19 – 2023-07-07 (×2): qty 30, 30d supply, fill #1
  Filled 2023-07-21 – 2023-07-30 (×2): qty 30, 30d supply, fill #2
  Filled 2023-08-17 – 2023-09-08 (×3): qty 30, 30d supply, fill #3
  Filled 2023-09-28 – 2023-10-08 (×2): qty 30, 30d supply, fill #4
  Filled 2023-11-03 – 2023-11-05 (×3): qty 30, 30d supply, fill #5

## 2023-05-25 NOTE — Telephone Encounter (Signed)
 Patient has an appt today at 9am

## 2023-05-25 NOTE — Progress Notes (Signed)
 Established Patient Office Visit  Subjective   Patient ID: Matthew Barnes, male    DOB: May 12, 1955  Age: 68 y.o. MRN: [id number]  Chief Complaint  Patient presents with   Dizziness    Recurrent xmonths, states Meclizine helps some    Pt is here for follow up today, states that he feels like his dizziness is getting worse, happens mostly when he gets up from a sitting position, has to wait for a couple of minutes before he can move. States that the meclizine does help. States that there is "something" on the back of his left neck, it is not painful or anything but he wondered if that might be causing something.   HTN -- BP in office performed and is well controlled. He  reports no side effects to the medications, no chest pain, SOB, dizziness or headaches. He has a BP cuff at home and is checking BP regularly, reports they are in the normal range.   Pt has multiple co morbid conditions including chronic systolic CHF with defibrillator, hasn't seen his cardiologist in some time sees Dr. Erroll Luna for his diabetes, states that his last A1C was very good, is off the insulin and is feeling very good.   Dizziness This is a chronic problem. The current episode started more than 1 year ago. The problem occurs intermittently. The problem has been gradually worsening. Associated symptoms include vertigo. Pertinent negatives include no neck pain, numbness, visual change, vomiting or weakness. The symptoms are aggravated by standing. The treatment provided mild relief.    Current Outpatient Medications  Medication Instructions   ACCU-CHEK GUIDE test strip USE TO check blood glucose twice daily AS DIRECTED   Accu-Chek Softclix Lancets lancets Check blood sugar 2 times daily   atorvastatin (LIPITOR) 20 MG tablet Take 1 tablet (20 mg total) by mouth daily at bedtime   bacitracin 500 UNIT/GM ointment 1 Application, Topical, 2 times daily   Blood Glucose Monitoring Suppl (ACCU-CHEK GUIDE) w/Device KIT Check  blood sugar 2 times daily   carvedilol (COREG) 6.25 MG tablet TAKE ONE TABLET BY MOUTH AT BREAKFAST AND AT BEDTIME   carvedilol (COREG) 6.25 MG tablet Take 1 tablet (6.25 mg total) by mouth 2 (two) times daily at breakfast and at bedtime.   digoxin (LANOXIN) 0.125 mg, Oral, Daily at bedtime   furosemide (LASIX) 20 mg, Oral, Every evening   glucose blood (ACCU-CHEK GUIDE) test strip Use to check blood sugar 2 times daily   losartan (COZAAR) 50 mg, Oral, Daily   meclizine (ANTIVERT) 12.5-25 mg, Oral, 3 times daily PRN   Mounjaro 12.5 mg, Subcutaneous, Weekly   Mounjaro 15 mg, Subcutaneous, Weekly   omeprazole (PRILOSEC) 20 mg, Oral, Daily   QUEtiapine (SEROQUEL) 200 MG tablet Take 2-3 tablets by mouth at bedtime as directed   QUEtiapine (SEROQUEL) 600 mg, Daily at bedtime   QUEtiapine (SEROQUEL) 600 mg, Oral, Daily at bedtime   sertraline (ZOLOFT) 100 mg, Oral, Daily   sertraline (ZOLOFT) 100 mg, Oral, Daily   tadalafil (CIALIS) 10-20 mg, Oral, Every 48 hours PRN    Patient Active Problem List   Diagnosis Date Noted   Dizziness 11/02/2022   ICD (implantable cardioverter-defibrillator) in place 09/11/2021   Morbid obesity (HCC) 04/12/2021   Other secondary pulmonary hypertension (HCC) 04/12/2021   Chronic kidney disease, stage 3b (HCC) 04/12/2021   Dilated cardiomyopathy (HCC)    Elevated troponin level not due to acute coronary syndrome    Chronic systolic CHF (congestive heart failure) (  HCC) 08/06/2020   AMS (altered mental status) 07/31/2020   Aphasia 07/31/2020   Difficulty with speech 07/30/2020   Chest pain 03/26/2018   Dyspnea on exertion 03/26/2018   Family history of heart disease 03/26/2018   Leg edema 10/27/2016   Bipolar affective, mixed (HCC) 04/29/2016   Esophageal reflux 01/17/2014   Type 2 diabetes mellitus with diabetic neuropathy, with long-term current use of insulin (HCC) 08/31/2013   Primary hypertension 05/03/2013   Hyperlipidemia 05/03/2013    Hypothyroidism 05/03/2013      Review of Systems  Gastrointestinal:  Negative for vomiting.  Musculoskeletal:  Negative for neck pain.  Neurological:  Positive for dizziness and vertigo. Negative for weakness and numbness.  All other systems reviewed and are negative.     Objective:     BP 122/72   Pulse 76   Temp 98.2 F (36.8 C) (Oral)   Ht 6\' 1"  (1.854 m)   Wt 273 lb 1.6 oz (123.9 kg)   SpO2 96%   BMI 36.03 kg/m    Physical Exam Vitals reviewed.  Constitutional:      Appearance: Normal appearance. He is well-groomed. He is obese.  Neck:     Thyroid: No thyromegaly.  Cardiovascular:     Rate and Rhythm: Normal rate and regular rhythm.     Heart sounds: S1 normal and S2 normal. No murmur heard. Pulmonary:     Effort: Pulmonary effort is normal.     Breath sounds: Normal breath sounds and air entry. No rales.  Musculoskeletal:     Right lower leg: No edema.     Left lower leg: No edema.  Neurological:     Mental Status: He is alert and oriented to person, place, and time. Mental status is at baseline.     Gait: Gait is intact.  Psychiatric:        Mood and Affect: Mood and affect normal.      No results found for any visits on 05/25/23.  Last lipids Lab Results  Component Value Date   CHOL 96 11/07/2022   HDL 22.00 (L) 11/07/2022   LDLCALC 38 10/28/2021   LDLDIRECT 53.0 11/07/2022   TRIG 257.0 (H) 11/07/2022   CHOLHDL 4 11/07/2022      The ASCVD Risk score (Arnett DK, et al., 2019) failed to calculate for the following reasons:   The valid total cholesterol range is 130 to 320 mg/dL    Assessment & Plan:  Dizziness Assessment & Plan: Chronic, ongoing, I discussed possible causes to this, he is describing most likely orthostatic hypotension or possibly chronic vertigo, the history has aspects of both conditions. I did encourage him to follow up with his cardiologist to check heart function and the possibility of a cardiac cause. I recommended  having him try the epley maneuvers and I gave him handouts on this. I also recommended compression stockings to help support his blood pressure with positional changes.    Colon cancer screening -     Cologuard  Primary hypertension Assessment & Plan: Current hypertension medications:       Sig   carvedilol (COREG) 6.25 MG tablet (Taking) TAKE ONE TABLET BY MOUTH AT BREAKFAST AND AT BEDTIME   carvedilol (COREG) 6.25 MG tablet (Taking) Take 1 tablet (6.25 mg total) by mouth 2 (two) times daily at breakfast and at bedtime.   tadalafil (CIALIS) 20 MG tablet (Taking As Needed) Take 0.5-1 tablets (10-20 mg total) by mouth every other day as needed for erectile dysfunction.  furosemide (LASIX) 20 MG tablet Take 1 tablet (20 mg total) by mouth every evening.   losartan (COZAAR) 50 MG tablet Take 1 tablet (50 mg total) by mouth daily.      BP well controlled today, will refill his losartan. Continued the above medications as prescribed.   Orders: -     Losartan Potassium; Take 1 tablet (50 mg total) by mouth daily.  Dispense: 90 tablet; Refill: 1  Gastroesophageal reflux disease, unspecified whether esophagitis present -     Omeprazole; Take 1 capsule (20 mg total) by mouth daily.  Dispense: 90 capsule; Refill: 1  Chronic systolic CHF (congestive heart failure) (HCC) Assessment & Plan: EF from last year remains at 25%, will continue the furosemide and digoxin. Symptoms are controlled at this time. The dizziness very well could be related to heart function and I stressed to patient that he should discuss this with his cardiologist.   Orders: -     Furosemide; Take 1 tablet (20 mg total) by mouth every evening.  Dispense: 90 tablet; Refill: 1  Pure hypercholesterolemia Assessment & Plan: Reviewed last labs, LDL controlled, will refill his statin medication. His HDL is still very low, counseled patient on eating healthy plant fats or getting Omega 3 fatty acid supplements OTC  Orders: -      Atorvastatin Calcium; Take 1 tablet (20 mg total) by mouth daily at bedtime  Dispense: 90 tablet; Refill: 1     Return in about 6 months (around 11/22/2023) for annual physical -- come fasting for bloodwork.    Karie Georges, MD

## 2023-05-26 ENCOUNTER — Ambulatory Visit (INDEPENDENT_AMBULATORY_CARE_PROVIDER_SITE_OTHER): Payer: Medicare HMO

## 2023-05-26 DIAGNOSIS — I5042 Chronic combined systolic (congestive) and diastolic (congestive) heart failure: Secondary | ICD-10-CM

## 2023-05-26 DIAGNOSIS — I420 Dilated cardiomyopathy: Secondary | ICD-10-CM

## 2023-05-27 LAB — CUP PACEART REMOTE DEVICE CHECK
Battery Remaining Longevity: 122 mo
Battery Voltage: 3.02 V
Brady Statistic RV Percent Paced: 0.01 %
Date Time Interrogation Session: 20250225033425
HighPow Impedance: 75 Ohm
Implantable Lead Connection Status: 753985
Implantable Lead Implant Date: 20230220
Implantable Lead Location: 753860
Implantable Lead Model: 6935
Implantable Pulse Generator Implant Date: 20230220
Lead Channel Impedance Value: 551 Ohm
Lead Channel Impedance Value: 608 Ohm
Lead Channel Pacing Threshold Amplitude: 0.375 V
Lead Channel Pacing Threshold Pulse Width: 0.4 ms
Lead Channel Sensing Intrinsic Amplitude: 9.625 mV
Lead Channel Sensing Intrinsic Amplitude: 9.625 mV
Lead Channel Setting Pacing Amplitude: 2 V
Lead Channel Setting Pacing Pulse Width: 0.4 ms
Lead Channel Setting Sensing Sensitivity: 0.45 mV
Zone Setting Status: 755011
Zone Setting Status: 755011

## 2023-05-28 ENCOUNTER — Telehealth: Payer: Self-pay | Admitting: Cardiovascular Disease

## 2023-05-28 NOTE — Telephone Encounter (Signed)
 STAT if patient feels like he/she is going to faint   1. Are you feeling dizzy, lightheaded, or faint right now?   Yes  2. Have you passed out?    No (If yes move to .[facility]NC[facility])  3. Do you have any other symptoms? No  4. Have you checked your HR and BP (record if available)?   No  Patient is concerned he has been feeling dizzy when standing or sitting. Patient stated he does not feel like he is going to faint.

## 2023-05-28 NOTE — Telephone Encounter (Signed)
-   seeing double - pt feels dizzy - for a while; few months - BP 125/80 - denies feeling like he was pass out  Called and spoke to patient. Patient states he feels dizzy whenever he gets up or sit down; also feels this way when he bends over to pick up something. At times, he sees double. Patient states he has been hydrated and has had episodes of "vertigo" only 3 times in his life. Feels his blood rush to his head whenever he bends over. BP today: 125/80-90. Denies feeling "faint" or passing out.  Will forward to MD for review. Pt made aware and verbalized understanding.

## 2023-05-31 ENCOUNTER — Encounter: Payer: Self-pay | Admitting: Internal Medicine

## 2023-05-31 NOTE — Assessment & Plan Note (Signed)
 Reviewed last labs, LDL controlled, will refill his statin medication. His HDL is still very low, counseled patient on eating healthy plant fats or getting Omega 3 fatty acid supplements OTC

## 2023-05-31 NOTE — Assessment & Plan Note (Signed)
 Chronic, ongoing, I discussed possible causes to this, he is describing most likely orthostatic hypotension or possibly chronic vertigo, the history has aspects of both conditions. I did encourage him to follow up with his cardiologist to check heart function and the possibility of a cardiac cause. I recommended having him try the epley maneuvers and I gave him handouts on this. I also recommended compression stockings to help support his blood pressure with positional changes.

## 2023-05-31 NOTE — Assessment & Plan Note (Signed)
 EF from last year remains at 25%, will continue the furosemide and digoxin. Symptoms are controlled at this time. The dizziness very well could be related to heart function and I stressed to patient that he should discuss this with his cardiologist.

## 2023-05-31 NOTE — Assessment & Plan Note (Signed)
 Current hypertension medications:       Sig   carvedilol (COREG) 6.25 MG tablet (Taking) TAKE ONE TABLET BY MOUTH AT BREAKFAST AND AT BEDTIME   carvedilol (COREG) 6.25 MG tablet (Taking) Take 1 tablet (6.25 mg total) by mouth 2 (two) times daily at breakfast and at bedtime.   tadalafil (CIALIS) 20 MG tablet (Taking As Needed) Take 0.5-1 tablets (10-20 mg total) by mouth every other day as needed for erectile dysfunction.   furosemide (LASIX) 20 MG tablet Take 1 tablet (20 mg total) by mouth every evening.   losartan (COZAAR) 50 MG tablet Take 1 tablet (50 mg total) by mouth daily.      BP well controlled today, will refill his losartan. Continued the above medications as prescribed.

## 2023-06-01 ENCOUNTER — Other Ambulatory Visit: Payer: Self-pay

## 2023-06-04 ENCOUNTER — Other Ambulatory Visit: Payer: Self-pay | Admitting: Family Medicine

## 2023-06-04 ENCOUNTER — Other Ambulatory Visit (HOSPITAL_BASED_OUTPATIENT_CLINIC_OR_DEPARTMENT_OTHER): Payer: Self-pay

## 2023-06-04 ENCOUNTER — Other Ambulatory Visit: Payer: Self-pay

## 2023-06-04 DIAGNOSIS — R42 Dizziness and giddiness: Secondary | ICD-10-CM

## 2023-06-04 MED ORDER — CARVEDILOL 6.25 MG PO TABS
6.2500 mg | ORAL_TABLET | Freq: Two times a day (BID) | ORAL | 1 refills | Status: DC
  Filled 2023-06-11: qty 60, 30d supply, fill #0
  Filled 2023-06-19 – 2023-07-07 (×2): qty 60, 30d supply, fill #1
  Filled 2023-07-21 – 2023-07-30 (×2): qty 60, 30d supply, fill #2
  Filled 2023-08-17 – 2023-09-08 (×3): qty 60, 30d supply, fill #3
  Filled 2023-09-28 – 2023-10-08 (×2): qty 60, 30d supply, fill #4
  Filled 2023-11-03 – 2023-11-05 (×3): qty 60, 30d supply, fill #5

## 2023-06-05 ENCOUNTER — Other Ambulatory Visit (HOSPITAL_BASED_OUTPATIENT_CLINIC_OR_DEPARTMENT_OTHER): Payer: Self-pay

## 2023-06-10 ENCOUNTER — Other Ambulatory Visit (HOSPITAL_BASED_OUTPATIENT_CLINIC_OR_DEPARTMENT_OTHER): Payer: Self-pay

## 2023-06-10 ENCOUNTER — Other Ambulatory Visit: Payer: Medicare HMO

## 2023-06-10 DIAGNOSIS — R42 Dizziness and giddiness: Secondary | ICD-10-CM

## 2023-06-10 DIAGNOSIS — I10 Essential (primary) hypertension: Secondary | ICD-10-CM

## 2023-06-10 MED ORDER — MECLIZINE HCL 25 MG PO TABS
12.5000 mg | ORAL_TABLET | Freq: Three times a day (TID) | ORAL | 2 refills | Status: AC | PRN
  Filled 2023-06-10: qty 30, 10d supply, fill #0
  Filled 2023-06-19 – 2023-07-07 (×2): qty 30, 10d supply, fill #1
  Filled 2024-04-22: qty 30, 10d supply, fill #2

## 2023-06-10 NOTE — Progress Notes (Signed)
 06/10/2023 Name: Matthew Barnes MRN: [id number] DOB: 08/12/1955  Chief Complaint  Patient presents with   Hypertension   Medication Management    Matthew Barnes is a 68 y.o. year old male who presented for a telephone visit.   They were referred to the pharmacist by their PCP for assistance in managing diabetes, hypertension, and complex medication management.    Subjective:  Care Team: Primary Care Provider: Karie Georges, MD   Medication Access/Adherence  Current Pharmacy:  [facility] [address] [place] [address] Phone: [phone] Fax: [phone]   Patient reports affordability concerns with their medications: No  Patient reports access/transportation concerns to their pharmacy: No  Patient reports adherence concerns with their medications:  No     Hypertension:  Current medications: Carvedilol 6.25mg  BID, Lasix 20mg  daily, Losartan 50mg  daily Medications previously tried: Amlodipine, HCTZ  Patient has a validated, automated, upper arm home BP cuff Current blood pressure readings readings: not checking BP at home  Patient reports hypotensive s/sx including dizziness, lightheadedness. (With position changes - standing up, bending over). Patient denies hypertensive symptoms including headache, chest pain, shortness of breath  Current meal patterns: mindful of salt intake   Objective:  Lab Results  Component Value Date   HGBA1C 6.2 10/28/2022    Lab Results  Component Value Date   CREATININE 1.50 10/28/2022   BUN 13 10/28/2022   NA 139 10/28/2022   K 4.2 10/28/2022   CL 102 10/28/2022   CO2 31 10/28/2022    Lab Results  Component Value Date   CHOL 96 11/07/2022   HDL 22.00 (L) 11/07/2022   LDLCALC 38 10/28/2021   LDLDIRECT 53.0 11/07/2022   TRIG 257.0 (H) 11/07/2022   CHOLHDL 4 11/07/2022    Medications Reviewed Today     Reviewed by Sherrill Raring, RPH (Pharmacist) on  06/10/23 at 0849  Med List Status: <None>   Medication Order Taking? Sig Documenting Provider Last Dose Status Informant  ACCU-CHEK GUIDE test strip [id number]  USE TO check blood glucose twice daily AS DIRECTED Reather Littler, MD  Active   Accu-Chek Softclix Lancets lancets [id number]  Check blood sugar 2 times daily Reather Littler, MD  Active   atorvastatin (LIPITOR) 20 MG tablet [id number] Yes Take 1 tablet (20 mg total) by mouth daily at bedtime Karie Georges, MD Taking Active   bacitracin 500 UNIT/GM ointment [id number] No Apply 1 Application topically 2 (two) times daily. Peter Garter, PA Unknown Active   Blood Glucose Monitoring Suppl (ACCU-CHEK GUIDE) w/Device KIT [id number]  Check blood sugar 2 times daily Reather Littler, MD  Active   carvedilol (COREG) 6.25 MG tablet [id number] Yes Take 1 tablet (6.25 mg total) by mouth 2 (two) times daily at breakfast and at bedtime. Karie Georges, MD Taking Active   digoxin (LANOXIN) 0.125 MG tablet [id number] Yes Take 1 tablet (0.125 mg total) by mouth at bedtime. Karie Georges, MD Taking Active   furosemide (LASIX) 20 MG tablet [id number] Yes Take 1 tablet (20 mg total) by mouth every evening. Karie Georges, MD Taking Active   glucose blood (ACCU-CHEK GUIDE) test strip [id number]  Use to check blood sugar 2 times daily   Active   losartan (COZAAR) 50 MG tablet [id number] Yes Take 1 tablet (50 mg total) by mouth daily. Karie Georges, MD Taking Active   meclizine (ANTIVERT) 25 MG tablet [id number] Yes Take 0.5-1 tablets (12.5-25 mg total) by mouth 3 (  three) times daily as needed for dizziness. Karie Georges, MD Taking Active   omeprazole (PRILOSEC) 20 MG capsule [id number] Yes Take 1 capsule (20 mg total) by mouth daily. Karie Georges, MD Taking Active   QUEtiapine (SEROQUEL) 200 MG tablet [id number] Yes Take 3 tablets (600 mg total) by mouth at bedtime.  Taking Active   QUEtiapine (SEROQUEL) 200 MG tablet [id number]  Take 2-3 tablets by  mouth at bedtime as directed   Active   sertraline (ZOLOFT) 100 MG tablet [id number] Yes Take 1 tablet (100 mg total) by mouth daily. Karie Georges, MD Taking Active   sertraline (ZOLOFT) 100 MG tablet [id number]  Take 1 tablet (100 mg total) by mouth daily.   Active   tadalafil (CIALIS) 20 MG tablet [id number] Yes Take 0.5-1 tablets (10-20 mg total) by mouth every other day as needed for erectile dysfunction. Karie Georges, MD Taking Active   tirzepatide [facility]) 12.5 MG/0.5ML Pen [id number] No Inject 12.5 mg into the skin once a week.  Patient not taking: Reported on 06/10/2023   Th[place], [place], MD Not Taking Active   tirzepatide [facility]) 15 MG/0.5ML Pen [id number] Yes Inject 15 mg into the skin once a week. Th[place], [place], MD Taking Active               Assessment/Plan:   Hypertension: - Currently controlled but experiencing dizziness -Unclear if related to low BP as patient is not checking BP during these episodes, does have history of dizzy spells/vertigo -Recommend scheduling a follow up with cardio as indicated at last PCP appointment  -Patient requests refill on Meclizine, PCP approved - Reviewed long term cardiovascular and renal outcomes of uncontrolled blood pressure - Reviewed appropriate blood pressure monitoring technique and reviewed goal blood pressure. Recommended to check home blood pressure and heart rate at least once weekly - Recommend to continue current medication therapy     Follow Up Plan: 1 month  Sherrill Raring, PharmD Clinical Pharmacist [phone]

## 2023-06-11 ENCOUNTER — Other Ambulatory Visit: Payer: Self-pay

## 2023-06-11 ENCOUNTER — Other Ambulatory Visit (HOSPITAL_BASED_OUTPATIENT_CLINIC_OR_DEPARTMENT_OTHER): Payer: Self-pay

## 2023-06-12 ENCOUNTER — Other Ambulatory Visit: Payer: Self-pay

## 2023-06-19 ENCOUNTER — Other Ambulatory Visit: Payer: Self-pay

## 2023-06-19 ENCOUNTER — Other Ambulatory Visit (HOSPITAL_BASED_OUTPATIENT_CLINIC_OR_DEPARTMENT_OTHER): Payer: Self-pay

## 2023-06-22 DIAGNOSIS — M9904 Segmental and somatic dysfunction of sacral region: Secondary | ICD-10-CM | POA: Diagnosis not present

## 2023-06-22 DIAGNOSIS — M9905 Segmental and somatic dysfunction of pelvic region: Secondary | ICD-10-CM | POA: Diagnosis not present

## 2023-06-22 DIAGNOSIS — M9901 Segmental and somatic dysfunction of cervical region: Secondary | ICD-10-CM | POA: Diagnosis not present

## 2023-06-22 DIAGNOSIS — M9903 Segmental and somatic dysfunction of lumbar region: Secondary | ICD-10-CM | POA: Diagnosis not present

## 2023-06-26 DIAGNOSIS — E119 Type 2 diabetes mellitus without complications: Secondary | ICD-10-CM | POA: Diagnosis not present

## 2023-06-26 LAB — HM DIABETES EYE EXAM

## 2023-06-29 ENCOUNTER — Ambulatory Visit: Attending: Cardiovascular Disease | Admitting: Cardiovascular Disease

## 2023-06-30 ENCOUNTER — Encounter: Payer: Self-pay | Admitting: Cardiovascular Disease

## 2023-07-01 NOTE — Addendum Note (Signed)
 Addended by: Geralyn Flash D on: 07/01/2023 03:24 PM   Modules accepted: Orders

## 2023-07-01 NOTE — Progress Notes (Signed)
 Remote ICD transmission.

## 2023-07-07 ENCOUNTER — Other Ambulatory Visit: Payer: Self-pay

## 2023-07-07 ENCOUNTER — Other Ambulatory Visit (HOSPITAL_BASED_OUTPATIENT_CLINIC_OR_DEPARTMENT_OTHER): Payer: Self-pay

## 2023-07-10 ENCOUNTER — Other Ambulatory Visit: Payer: Self-pay

## 2023-07-14 ENCOUNTER — Ambulatory Visit: Payer: Medicare Other | Admitting: Family Medicine

## 2023-07-14 DIAGNOSIS — Z0000 Encounter for general adult medical examination without abnormal findings: Secondary | ICD-10-CM | POA: Diagnosis not present

## 2023-07-14 NOTE — Patient Instructions (Addendum)
 I really enjoyed getting to talk with you today! I am available on Tuesdays and Thursdays for virtual visits if you have any questions or concerns, or if I can be of any further assistance.   CHECKLIST FROM ANNUAL WELLNESS VISIT:  -Follow up (please call to schedule if not scheduled after visit):   -yearly for annual wellness visit with primary care office  Here is a list of your preventive care/health maintenance measures and the plan for each if any are due:  PLAN For any measures below that may be due:  -please complete the cologuard test and return today -please call your endocrinologist today to set up your labs and kidney urine test -please request the diabetic foot exam the next time you are in the office -can get the covid vaccine at the pharmacy, if you do please let us know so that we can update your record  Health Maintenance  Topic Date Due   Fecal DNA (Cologuard)  11/07/2019   FOOT EXAM  11/02/2022   COVID-19 Vaccine (4 - 2024-25 season) 11/30/2022   HEMOGLOBIN A1C  04/30/2023   Diabetic kidney evaluation - Urine ACR  08/01/2023   Diabetic kidney evaluation - eGFR measurement  10/28/2023   OPHTHALMOLOGY EXAM  06/25/2024   Medicare Annual Wellness (AWV)  07/13/2024   DTaP/Tdap/Td (2 - Td or Tdap) 10/24/2031   Pneumonia Vaccine 76+ Years old  Completed   Hepatitis C Screening  Completed   Zoster Vaccines- Shingrix  Completed   HPV VACCINES  Aged Out   Meningococcal B Vaccine  Aged Out   INFLUENZA VACCINE  Discontinued    -See a dentist at least yearly  -Get your eyes checked and then per your eye specialist's recommendations  -Other issues addressed today:   -increase veggies to 3-4 servings per day minimum  -make sure you are getting adequate protein, a minimum of 65-80 g per day   -I have included below further information regarding a healthy whole foods based diet, physical activity guidelines for adults, stress management and opportunities for social  connections. I hope you find this information useful.   -----------------------------------------------------------------------------------------------------------------------------------------------------------------------------------------------------------------------------------------------------------    NUTRITION: -eat real food: lots of colorful vegetables (half the plate) and fruits -5-7 servings of vegetables and fruits per day (fresh or steamed is best), exp. 2 servings of vegetables with lunch and dinner and 2 servings of fruit per day. Berries and greens such as kale and collards are great choices.  -consume on a regular basis:  fresh fruits, fresh veggies, fish, nuts, seeds, healthy oils (such as olive oil, avocado oil), whole grains (make sure for bread/pasta/crackers/etc., that the first ingredient on label contains the word "whole"), legumes. -can eat small amounts of dairy and lean meat (no larger than the palm of your hand), but avoid processed meats such as ham, bacon, lunch meat, etc. -drink water -try to avoid fast food and pre-packaged foods, processed meat, ultra processed foods/beverages (donuts, candy, etc.) -most experts advise limiting sodium to < 2300mg  per day, should limit further is any chronic conditions such as high blood pressure, heart disease, diabetes, etc. The American Heart Association advised that < 1500mg  is is ideal -try to avoid foods/beverages that contain any ingredients with names you do not recognize  -try to avoid foods/beverages  with added sugar or sweeteners/sweets  -try to avoid sweet drinks (including diet drinks): soda, juice, Gatorade, sweet tea, power drinks, diet drinks -try to avoid white rice, white bread, pasta (unless whole grain)  EXERCISE GUIDELINES  FOR ADULTS: -if you wish to increase your physical activity, do so gradually and with the approval of your doctor -STOP and seek medical care immediately if you have any chest pain, chest  discomfort or trouble breathing when starting or increasing exercise  -move and stretch your body, legs, feet and arms when sitting for long periods -Physical activity guidelines for optimal health in adults: -get at least 150 minutes per week of moderate exercise (can talk, but not sing); this is about 20-30 minutes of sustained activity 5-7 days per week or two 10-15 minute episodes of sustained activity 5-7 days per week -do some muscle building/resistance training/strength training at least 2 days per week  -balance exercises 3+ days per week:   Stand somewhere where you have something sturdy to hold onto if you lose balance    1) lift up on toes, then back down, start with 5x per day and work up to 20x   2) stand and lift one leg straight out to the side so that foot is a few inches of the floor, start with 5x each side and work up to 20x each side   3) stand on one foot, start with 5 seconds each side and work up to 20 seconds on each side  If you need ideas or help with getting more active:  -Silver sneakers https://tools.silversneakers.com  -Walk with a Doc: http://www.duncan-[email or web address]/  -try to include resistance (weight lifting/strength building) and balance exercises twice per week: or the following link for ideas: http://castillo-powell.com/  BuyDucts.dk  STRESS MANAGEMENT: -can try meditating, or just sitting quietly with deep breathing while intentionally relaxing all parts of your body for 5 minutes daily -if you need further help with stress, anxiety or depression please follow up with your primary doctor or contact the wonderful folks at [facility]: [phone]  SOCIAL CONNECTIONS: -options in [place] if you wish to engage in more social and exercise related activities:  -Silver sneakers https://tools.silversneakers.com  -Walk with a  Doc: http://www.duncan-williams.[email or web address]/  -Check out the [facility] Active Adults 50+ section on the [place] of [employer] (hiking clubs, book clubs, cards and games, chess, exercise classes, aquatic classes and much more) - see the website for details: https://www.[place].gov/departments/parks-recreation/active-adults50  -YouTube has lots of exercise videos for different ages and abilities as well  -[facility] (a variety of indoor and outdoor inperson activities for adults). [phone]. [address]  -Virtual Online Classes (a variety of topics): see seniorplanet.org or call [phone]  -consider volunteering at a school, hospice center, church, senior center or elsewhere   ADVANCED HEALTHCARE DIRECTIVES:  [place] Advanced Directives assistance:   ExpressWeek.com.cy  Everyone should have advanced health care directives in place. This is so that you get the care you want, should you ever be in a situation where you are unable to make your own medical decisions.   From the [place] Advanced Directive Website: "Advance Health Care Directives are legal documents in which you give written instructions about your health care if, in the future, you cannot speak for yourself.   A health care power of attorney allows you to name a person you trust to make your health care decisions if you cannot make them yourself. A declaration of a desire for a natural death (or living will) is document, which states that you desire not to have your life prolonged by extraordinary measures if you have a terminal or incurable illness or if you are in a vegetative state. An advance instruction for mental  health treatment makes a declaration of instructions, information and preferences regarding your mental health treatment. It also states that you are aware that the advance instruction authorizes a mental health treatment provider to act  according to your wishes. It may also outline your consent or refusal of mental health treatment. A declaration of an anatomical gift allows anyone over the age of 8 to make a gift by will, organ donor card or other document."   Please see the following website or an elder law attorney for forms, FAQs and for completion of advanced directives: [place]  Print production planner Health Care Directives Advance Health Care Directives (http://guzman.[email or web address]/)  Or copy and paste the following to your web browser: [email or web address]

## 2023-07-14 NOTE — Progress Notes (Signed)
 PATIENT CHECK-IN and HEALTH RISK ASSESSMENT QUESTIONNAIRE:  -completed by phone/video for upcoming Medicare Preventive Visit  Pre-Visit Check-in: 1)Vitals (height, wt, BP, etc) - record in vitals section for visit on day of visit Request home vitals (wt, BP, etc.) and enter into vitals, THEN update Vital Signs SmartPhrase below at the top of the HPI. See below.  2)Review and Update Medications, Allergies PMH, Surgeries, Social history in Epic 3)Hospitalizations in the last year with date/reason? n  4)Review and Update Care Team (patient's specialists) in Epic 5) Complete PHQ9 in Epic  6) Complete Fall Screening in Epic 7)Review all Health Maintenance Due and order under PCP if not done.  Medicare Wellness Patient Questionnaire:  Answer theses question about your habits: How often do you have a drink containing alcohol?rarely How many drinks containing alcohol do you have on a typical day when you are drinking?1 How often do you have six or more drinks on one occasion?n Have you ever smoked? n Do you use an illicit drugs?n On average, how many days per week do you engage in moderate to strenuous exercise (like a brisk walk)? 2-3 times per week On average, how many minutes do you engage in exercise at this level? 30 minutes, elliptical, exercise bands Typical breakfast: sausage and egg mcmuffin, sometimes some oatmeal with a little splenda Typical lunch: [place] sandwich Typical snacks:protein  Beverages:  water  Answer theses question about your everyday activities: Can you perform most household chores?y Are you deaf or have significant trouble hearing?n Do you feel that you have a problem with memory?n Do you feel safe at home?y Last dentist visit?admits needs to do this, needs implants  8. Do you have any difficulty performing your everyday activities?n Are you having any difficulty walking, taking medications on your own, and or difficulty managing daily home needs?n Do you  have difficulty walking or climbing stairs?n Do you have difficulty dressing or bathing?n Do you have difficulty doing errands alone such as visiting a doctor's office or shopping?n Do you currently have any difficulty preparing food and eating?n Do you currently have any difficulty using the toilet?n Do you have any difficulty managing your finances?n Do you have any difficulties with housekeeping of managing your housekeeping?n   Do you have Advanced Directives in place (Living Will, Healthcare Power or Attorney)? n   Last eye Exam and location?just had eye exam, Saw Dr. Ander Purpura at [facility]   Do you currently use prescribed or non-prescribed narcotic or opioid pain medications?n  Do you have a history or close family history of breast, ovarian, tubal or peritoneal cancer or a family member with BRCA (breast cancer susceptibility 1 and 2) gene mutations?n    ----------------------------------------------------------------------------------------------------------------------------------------------------------------------------------------------------------------------  Because this visit was a virtual/telehealth visit, some criteria may be missing or patient reported. Any vitals not documented were not able to be obtained and vitals that have been documented are patient reported.    MEDICARE ANNUAL PREVENTIVE CARE VISIT WITH PROVIDER (Welcome to Medicare, initial annual wellness or annual wellness exam)  Virtual Visit via Video  Note  I connected with Matthew Barnes on 07/14/23  by a video enabled telemedicine application and verified that I am speaking with the correct person using two identifiers.  Location patient: home Location provider:work or home office Persons participating in the virtual visit: patient, provider  Concerns and/or follow up today: doing well. On Monjauro and is happy wt reduction.    See HM section in Epic for other details of  completed HM.     ROS: negative for report of fevers, unintentional weight loss, vision changes, vision loss, hearing loss or change, chest pain, sob, hemoptysis, melena, hematochezia, hematuria, falls, bleeding or bruising, thoughts of suicide or self harm, memory loss  Patient-completed extensive health risk assessment - reviewed and discussed with the patient: See Health Risk Assessment completed with patient prior to the visit either above or in recent phone note. This was reviewed in detailed with the patient today and appropriate recommendations, orders and referrals were placed as needed per Summary below and patient instructions.   Review of Medical History: -PMH, PSH, Family History and current specialty and care providers reviewed and updated and listed below   Patient Care Team: Karie Georges, MD as PCP - General (Family Medicine) Runell Gess, MD as PCP - Cardiology (Cardiology) Marinus Maw, MD as PCP - Electrophysiology (Cardiology) Adegoroye, Charlies Silvers, MD (Specialist) Sherrill Raring, [facility] (Pharmacist)   Past Medical History:  Diagnosis Date   Bipolar II disorder - Managed by Jacqulyn Ducking [phone]) 05/03/2013   Diabetes mellitus    Dysphagia    Hyperlipidemia    Hypertension    Unspecified hypothyroidism 05/03/2013    Past Surgical History:  Procedure Laterality Date   ICD IMPLANT N/A 05/20/2021   Procedure: ICD IMPLANT;  Surgeon: Marinus Maw, MD;  Location: [facility] INVASIVE CV LAB;  Service: Cardiovascular;  Laterality: N/A;   KNEE SURGERY     scope on left knee   RIGHT/LEFT HEART CATH AND CORONARY ANGIOGRAPHY N/A 08/07/2020   Procedure: RIGHT/LEFT HEART CATH AND CORONARY ANGIOGRAPHY;  Surgeon: Marykay Lex, MD;  Location: [facility] INVASIVE CV LAB;  Service: Cardiovascular;  Laterality: N/A;    Social History   Socioeconomic History   Marital status: Single    Spouse name: Not on file   Number of children: Not on file   Years of education: Not on file    Highest education level: GED or equivalent  Occupational History   Not on file  Tobacco Use   Smoking status: Never   Smokeless tobacco: Never  Vaping Use   Vaping status: Never Used  Substance and Sexual Activity   Alcohol use: No   Drug use: No   Sexual activity: Not Currently  Other Topics Concern   Not on file  Social History Narrative   Work or School: on disability for knee arthritis      Home Situation: lives alone      Spiritual Beliefs:Christian      Lifestyle:no regular exercising; diet not great            Social Drivers of Health   Financial Resource Strain: Low Risk  (01/02/2023)   Overall Financial Resource Strain (CARDIA)    Difficulty of Paying Living Expenses: Not hard at all  Food Insecurity: No Food Insecurity (01/02/2023)   Hunger Vital Sign    Worried About Running Out of Food in the Last Year: Never true    Ran Out of Food in the Last Year: Never true  Transportation Needs: No Transportation Needs (01/02/2023)   PRAPARE - Administrator, Civil Service (Medical): No    Lack of Transportation (Non-Medical): No  Physical Activity: Sufficiently Active (01/02/2023)   Exercise Vital Sign    Days of Exercise per Week: 5 days    Minutes of Exercise per Session: 30 min  Stress: No Stress Concern Present (01/02/2023)   [employer] of Occupational Health - Occupational Stress  Questionnaire    Feeling of Stress : Not at all  Social Connections: Socially Isolated (01/02/2023)   Social Connection and Isolation Panel [NHANES]    Frequency of Communication with Friends and Family: Twice a week    Frequency of Social Gatherings with Friends and Family: Once a week    Attends Religious Services: Never    Database administrator or Organizations: No    Attends Engineer, structural: Not on file    Marital Status: Divorced  Intimate Partner Violence: Not At Risk (03/10/2022)   Humiliation, Afraid, Rape, and Kick questionnaire    Fear of  Current or Ex-Partner: No    Emotionally Abused: No    Physically Abused: No    Sexually Abused: No    Family History  Problem Relation Age of Onset   Diabetes Mother    Hypertension Mother    Heart disease Mother    Heart disease Father    Heart attack Father 62   Diabetes Father    Hypertension Father    Depression Father    Diabetes Sister    Depression Paternal Grandmother    Depression Other     Current Outpatient Medications on File Prior to Visit  Medication Sig Dispense Refill   ACCU-CHEK GUIDE test strip USE TO check blood glucose twice daily AS DIRECTED 100 strip 3   Accu-Chek Softclix Lancets lancets Check blood sugar 2 times daily 100 each 3   atorvastatin (LIPITOR) 20 MG tablet Take 1 tablet (20 mg total) by mouth daily at bedtime 90 tablet 1   bacitracin 500 UNIT/GM ointment Apply 1 Application topically 2 (two) times daily. 120 g 0   Blood Glucose Monitoring Suppl (ACCU-CHEK GUIDE) w/Device KIT Check blood sugar 2 times daily 1 kit 0   carvedilol (COREG) 6.25 MG tablet Take 1 tablet (6.25 mg total) by mouth 2 (two) times daily at breakfast and at bedtime. 180 tablet 1   digoxin (LANOXIN) 0.125 MG tablet Take 1 tablet (0.125 mg total) by mouth at bedtime. 90 tablet 3   furosemide (LASIX) 20 MG tablet Take 1 tablet (20 mg total) by mouth every evening. 90 tablet 1   glucose blood (ACCU-CHEK GUIDE) test strip Use to check blood sugar 2 times daily 100 each 3   losartan (COZAAR) 50 MG tablet Take 1 tablet (50 mg total) by mouth daily. 90 tablet 1   meclizine (ANTIVERT) 25 MG tablet Take 0.5-1 tablets (12.5-25 mg total) by mouth 3 (three) times daily as needed for dizziness. 30 tablet 2   omeprazole (PRILOSEC) 20 MG capsule Take 1 capsule (20 mg total) by mouth daily. 90 capsule 1   QUEtiapine (SEROQUEL) 200 MG tablet Take 3 tablets (600 mg total) by mouth at bedtime. 270 tablet 2   QUEtiapine (SEROQUEL) 200 MG tablet Take 2-3 tablets by mouth at bedtime as directed 270  tablet 2   sertraline (ZOLOFT) 100 MG tablet Take 1 tablet (100 mg total) by mouth daily. 90 tablet 1   sertraline (ZOLOFT) 100 MG tablet Take 1 tablet (100 mg total) by mouth daily. 90 tablet 2   tadalafil (CIALIS) 20 MG tablet Take 0.5-1 tablets (10-20 mg total) by mouth every other day as needed for erectile dysfunction. 10 tablet 3   tirzepatide (MOUNJARO) 12.5 MG/0.5ML Pen Inject 12.5 mg into the skin once a week. (Patient not taking: Reported on 06/10/2023) 6 mL 3   tirzepatide (MOUNJARO) 15 MG/0.5ML Pen Inject 15 mg into the skin once a  week. 6 mL 4   No current facility-administered medications on file prior to visit.    Allergies  Allergen Reactions   Influenza Vaccines Swelling and Other (See Comments)    Reports of swelling of the arm and then was "sick" for 10 days       Physical Exam Vitals requested from patient and listed below if patient had equipment and was able to obtain at home for this virtual visit: There were no vitals filed for this visit. Estimated body mass index is 36.03 kg/m as calculated from the following:   Height as of 05/25/23: 6\' 1"  (1.854 m).   Weight as of 05/25/23: 273 lb 1.6 oz (123.9 kg).  EKG (optional): deferred due to virtual visit  GENERAL: alert, oriented, no acute distress detected; full vision exam deferred due to pandemic and/or virtual encounter  HEENT: atraumatic, conjunttiva clear, no obvious abnormalities on inspection of external nose and ears  NECK: normal movements of the head and neck  LUNGS: on inspection no signs of respiratory distress, breathing rate appears normal, no obvious gross SOB, gasping or wheezing  CV: no obvious cyanosis  MS: moves all visible extremities without noticeable abnormality  PSYCH/NEURO: pleasant and cooperative, no obvious depression or anxiety, speech and thought processing grossly intact, Cognitive function grossly intact  Flowsheet Row Office Visit from [facility]/23/20[facility] in [facility] [place]  PHQ-9 Total Score 5           07/14/2023   10:44 AM 03/10/2022   12:41 PM 12/23/2021   10:24 AM 02/25/2021   11:21 AM 10/05/2020    2:30 PM  Depression screen PHQ 2/9  Decreased Interest 0 0 2 0 0  Down, Depressed, Hopeless 0 0 1 0 0  PHQ - 2 Score 0 0 3 0 0  Altered sleeping   0  0  Tired, decreased energy   0  0  Change in appetite   2  0  Feeling bad or failure about yourself    0  0  Trouble concentrating   0  0  Moving slowly or fidgety/restless   0  0  Suicidal thoughts   0  0  PHQ-9 Score   5  0  Difficult doing work/chores   Somewhat difficult         12/23/2021   10:24 AM 03/10/2022   12:42 PM 10/28/2022    2:50 PM 01/02/2023    2:46 PM 07/14/2023   10:44 AM  Fall Risk  Falls in the past year? 0 0 0 0 0  Was there an injury with Fall? 0 0 0  0  Fall Risk Category Calculator 0 0 0  0  Fall Risk Category (Retired) Low Low     (RETIRED) Patient Fall Risk Level Low fall risk Low fall risk     Patient at Risk for Falls Due to No Fall Risks No Fall Risks No Fall Risks    Fall risk Follow up Falls evaluation completed Falls prevention discussed Falls evaluation completed       SUMMARY AND PLAN:  Encounter for Medicare annual wellness exam   Discussed applicable health maintenance/preventive health measures and advised and referred or ordered per patient preferences: -discussed colon cancer screening, he says he has the cologuard kit and just got a month ago, he agrees to complete. Advised to do asap. -discussed labs due and he reports endo will do and agrees to schedule -discussed vaccines due recs/risks and advised can do  at the pharmacy and to let us know when does so that we can update record Health Maintenance  Topic Date Due   Fecal DNA (Cologuard)  11/07/2019   FOOT EXAM  11/02/2022   COVID-19 Vaccine (4 - 2024-25 season) 11/30/2022   HEMOGLOBIN A1C  04/30/2023   Diabetic kidney evaluation - Urine ACR  08/01/2023   Diabetic  kidney evaluation - eGFR measurement  10/28/2023   OPHTHALMOLOGY EXAM  06/25/2024   Medicare Annual Wellness (AWV)  07/13/2024   DTaP/Tdap/Td (2 - Td or Tdap) 10/24/2031   Pneumonia Vaccine 60+ Years old  Completed   Hepatitis C Screening  Completed   Zoster Vaccines- Shingrix  Completed   HPV VACCINES  Aged Out   Meningococcal B Vaccine  Aged Out   INFLUENZA VACCINE  Discontinued     Education and counseling on the following was provided based on the above review of health and a plan/checklist for the patient, along with additional information discussed, was provided for the patient in the patient instructions :  -Advised on importance of completing advanced directives, discussed options for completing and provided information in patient instructions as well -Advised and counseled on a healthy lifestyle  -Reviewed patient's current diet. Advised and counseled on a whole foods based healthy diet. Encouraged to increase veggies to 3-4 servings per day minimum and ensure getting adequate protein - discussed grams per day and how to get. A summary of a healthy diet was provided in the Patient Instructions.  -reviewed patient's current physical activity level and discussed exercise guidelines for adults. Discussed community resources and ideas for safe exercise at home to assist in meeting exercise guideline recommendations in a safe and healthy way. Encouraged to increase to meet the 150 minutes per day minimum.  -Advise yearly dental visits at minimum and regular eye exams -Advised and counseled on alcohol safe limits, risks Follow up: see patient instructions   Patient Instructions  I really enjoyed getting to talk with you today! I am available on Tuesdays and Thursdays for virtual visits if you have any questions or concerns, or if I can be of any further assistance.   CHECKLIST FROM ANNUAL WELLNESS VISIT:  -Follow up (please call to schedule if not scheduled after visit):   -yearly  for annual wellness visit with primary care office  Here is a list of your preventive care/health maintenance measures and the plan for each if any are due:  PLAN For any measures below that may be due:  -please complete the cologuard test and return today -please call your endocrinologist today to set up your labs and kidney urine test -please request the diabetic foot exam the next time you are in the office -can get the covid vaccine at the pharmacy, if you do please let us know so that we can update your record  Health Maintenance  Topic Date Due   Fecal DNA (Cologuard)  11/07/2019   FOOT EXAM  11/02/2022   COVID-19 Vaccine (4 - 2024-25 season) 11/30/2022   HEMOGLOBIN A1C  04/30/2023   Diabetic kidney evaluation - Urine ACR  08/01/2023   Diabetic kidney evaluation - eGFR measurement  10/28/2023   OPHTHALMOLOGY EXAM  06/25/2024   Medicare Annual Wellness (AWV)  07/13/2024   DTaP/Tdap/Td (2 - Td or Tdap) 10/24/2031   Pneumonia Vaccine 36+ Years old  Completed   Hepatitis C Screening  Completed   Zoster Vaccines- Shingrix  Completed   HPV VACCINES  Aged Out   Meningococcal B Vaccine  Aged Out   INFLUENZA VACCINE  Discontinued    -See a dentist at least yearly  -Get your eyes checked and then per your eye specialist's recommendations  -Other issues addressed today:   -increase veggies to 3-4 servings per day minimum  -make sure you are getting adequate protein, a minimum of 65-80 g per day   -I have included below further information regarding a healthy whole foods based diet, physical activity guidelines for adults, stress management and opportunities for social connections. I hope you find this information useful.    -----------------------------------------------------------------------------------------------------------------------------------------------------------------------------------------------------------------------------------------------------------    NUTRITION: -eat real food: lots of colorful vegetables (half the plate) and fruits -5-7 servings of vegetables and fruits per day (fresh or steamed is best), exp. 2 servings of vegetables with lunch and dinner and 2 servings of fruit per day. Berries and greens such as kale and collards are great choices.  -consume on a regular basis:  fresh fruits, fresh veggies, fish, nuts, seeds, healthy oils (such as olive oil, avocado oil), whole grains (make sure for bread/pasta/crackers/etc., that the first ingredient on label contains the word "whole"), legumes. -can eat small amounts of dairy and lean meat (no larger than the palm of your hand), but avoid processed meats such as ham, bacon, lunch meat, etc. -drink water -try to avoid fast food and pre-packaged foods, processed meat, ultra processed foods/beverages (donuts, candy, etc.) -most experts advise limiting sodium to < 2300mg  per day, should limit further is any chronic conditions such as high blood pressure, heart disease, diabetes, etc. The American Heart Association advised that < 1500mg  is is ideal -try to avoid foods/beverages that contain any ingredients with names you do not recognize  -try to avoid foods/beverages  with added sugar or sweeteners/sweets  -try to avoid sweet drinks (including diet drinks): soda, juice, Gatorade, sweet tea, power drinks, diet drinks -try to avoid white rice, white bread, pasta (unless whole grain)  EXERCISE GUIDELINES FOR ADULTS: -if you wish to increase your physical activity, do so gradually and with the approval of your doctor -STOP and seek medical care immediately if you have any chest pain, chest discomfort or trouble breathing when starting or  increasing exercise  -move and stretch your body, legs, feet and arms when sitting for long periods -Physical activity guidelines for optimal health in adults: -get at least 150 minutes per week of moderate exercise (can talk, but not sing); this is about 20-30 minutes of sustained activity 5-7 days per week or two 10-15 minute episodes of sustained activity 5-7 days per week -do some muscle building/resistance training/strength training at least 2 days per week  -balance exercises 3+ days per week:   Stand somewhere where you have something sturdy to hold onto if you lose balance    1) lift up on toes, then back down, start with 5x per day and work up to 20x   2) stand and lift one leg straight out to the side so that foot is a few inches of the floor, start with 5x each side and work up to 20x each side   3) stand on one foot, start with 5 seconds each side and work up to 20 seconds on each side  If you need ideas or help with getting more active:  -Silver sneakers https://tools.silversneakers.com  -Walk with a Doc: http://www.duncan-williams.com/  -try to include resistance (weight lifting/strength building) and balance exercises twice per week: or the following link for ideas: http://castillo-powell.com/  BuyDucts.dk  STRESS MANAGEMENT: -can try meditating,  or just sitting quietly with deep breathing while intentionally relaxing all parts of your body for 5 minutes daily -if you need further help with stress, anxiety or depression please follow up with your primary doctor or contact the wonderful folks at [facility]: [phone]  SOCIAL CONNECTIONS: -options in [place] if you wish to engage in more social and exercise related activities:  -Silver sneakers https://tools.silversneakers.com  -Walk with a Doc: http://www.duncan-williams.com/  -Check out the [facility] Active Adults 50+  section on the [place] [employer] (hiking clubs, book clubs, cards and games, chess, exercise classes, aquatic classes and much more) - see the website for details: https://www.[place].gov/departments/parks-recreation/active-adults50  -YouTube has lots of exercise videos for different ages and abilities as well  -[facility] (a variety of indoor and outdoor inperson activities for adults). [phone]. [address]  -Virtual Online Classes (a variety of topics): see seniorplanet.org or call [phone]  -consider volunteering at a school, hospice center, church, senior center or elsewhere   ADVANCED HEALTHCARE DIRECTIVES:  [place] Advanced Directives assistance:   ExpressWeek.com.cy  Everyone should have advanced health care directives in place. This is so that you get the care you want, should you ever be in a situation where you are unable to make your own medical decisions.   From the [place] Advanced Directive Website: "Advance Health Care Directives are legal documents in which you give written instructions about your health care if, in the future, you cannot speak for yourself.   A health care power of attorney allows you to name a person you trust to make your health care decisions if you cannot make them yourself. A declaration of a desire for a natural death (or living will) is document, which states that you desire not to have your life prolonged by extraordinary measures if you have a terminal or incurable illness or if you are in a vegetative state. An advance instruction for mental health treatment makes a declaration of instructions, information and preferences regarding your mental health treatment. It also states that you are aware that the advance instruction authorizes a mental health treatment provider to act according to your wishes. It may also outline your consent or refusal of mental  health treatment. A declaration of an anatomical gift allows anyone over the age of 85 to make a gift by will, organ donor card or other document."   Please see the following website or an elder law attorney for forms, FAQs and for completion of advanced directives: [place]  Print production planner Health Care Directives Advance Health Care Directives (http://guzman.[email or web address]/)  Or copy and paste the following to your web browser: [email or web address]           Maurie Southern, DO

## 2023-07-15 ENCOUNTER — Other Ambulatory Visit

## 2023-07-15 DIAGNOSIS — E1140 Type 2 diabetes mellitus with diabetic neuropathy, unspecified: Secondary | ICD-10-CM

## 2023-07-15 NOTE — Progress Notes (Signed)
 07/15/2023 Name: Matthew Barnes MRN: [id number] DOB: 10-11-1955  Chief Complaint  Patient presents with   Diabetes   Medication Management   Hypertension    [name] is a 68 y.o. year old male who presented for a telephone visit.   They were referred to the pharmacist by their PCP for assistance in managing diabetes, hypertension, and complex medication management.    Subjective:  Care Team: Primary Care Provider: Karie Georges, Barnes   Medication Access/Adherence  Current Pharmacy:  [facility] [address] [place] [address] Phone: [phone] Fax: [phone]   Patient reports affordability concerns with their medications: No  Patient reports access/transportation concerns to their pharmacy: No  Patient reports adherence concerns with their medications:  No     Hypertension:  Current medications: Carvedilol 6.25mg  BID, Lasix 20mg  daily, Losartan 50mg  daily Medications previously tried: Amlodipine, HCTZ  Patient has a validated, automated, upper arm home BP cuff Current blood pressure readings readings: not checking BP at home  Patient denies any symptoms of hypotension Patient denies hypertensive symptoms including headache, chest pain, shortness of breath  Current meal patterns: mindful of salt intake   Objective:  Lab Results  Component Value Date   HGBA1C 6.2 10/28/2022    Lab Results  Component Value Date   CREATININE 1.50 10/28/2022   BUN 13 10/28/2022   NA 139 10/28/2022   K 4.2 10/28/2022   CL 102 10/28/2022   CO2 31 10/28/2022    Lab Results  Component Value Date   CHOL 96 11/07/2022   HDL 22.00 (L) 11/07/2022   LDLCALC 38 10/28/2021   LDLDIRECT 53.0 11/07/2022   TRIG 257.0 (H) 11/07/2022   CHOLHDL 4 11/07/2022    Medications Reviewed Today     Reviewed by Matthew Barnes, RPH (Pharmacist) on 07/15/23 at [phone]  Med List Status: <None>   Medication Order Taking? Sig  Documenting Provider Last Dose Status Informant  ACCU-CHEK GUIDE test strip [id number] No USE TO check blood glucose twice daily AS DIRECTED Matthew Littler, Barnes Taking Active   Accu-Chek Softclix Lancets lancets [id number] No Check blood sugar 2 times daily Matthew Littler, Barnes Taking Active   atorvastatin (LIPITOR) 20 MG tablet [id number] No Take 1 tablet (20 mg total) by mouth daily at bedtime Matthew Georges, Barnes Taking Active   bacitracin 500 UNIT/GM ointment [id number] No Apply 1 Application topically 2 (two) times daily. Matthew Garter, PA Unknown Active   Blood Glucose Monitoring Suppl (ACCU-CHEK GUIDE) w/Device KIT [id number] No Check blood sugar 2 times daily Matthew Littler, Barnes Taking Active   carvedilol (COREG) 6.25 MG tablet [id number] No Take 1 tablet (6.25 mg total) by mouth 2 (two) times daily at breakfast and at bedtime. Matthew Georges, Barnes Taking Active   digoxin (LANOXIN) 0.125 MG tablet [id number] No Take 1 tablet (0.125 mg total) by mouth at bedtime. Matthew Georges, Barnes Taking Active   furosemide (LASIX) 20 MG tablet [id number] No Take 1 tablet (20 mg total) by mouth every evening. Matthew Georges, Barnes Taking Active   glucose blood (ACCU-CHEK GUIDE) test strip [id number] No Use to check blood sugar 2 times daily  Taking Active   losartan (COZAAR) 50 MG tablet [id number] No Take 1 tablet (50 mg total) by mouth daily. Matthew Georges, Barnes Taking Active   meclizine (ANTIVERT) 25 MG tablet [id number]  Take 0.5-1 tablets (12.5-25 mg total) by mouth 3 (three) times daily as needed for  dizziness. Matthew House, Barnes  Active   omeprazole (PRILOSEC) 20 MG capsule [id number] No Take 1 capsule (20 mg total) by mouth daily. Matthew House, Barnes Taking Active   QUEtiapine (SEROQUEL) 200 MG tablet [id number] No Take 3 tablets (600 mg total) by mouth at bedtime.  Taking Active   QUEtiapine (SEROQUEL) 200 MG tablet [id number] No Take 2-3 tablets by mouth at bedtime as directed  Taking Active   sertraline  (ZOLOFT) 100 MG tablet [id number] No Take 1 tablet (100 mg total) by mouth daily. Matthew House, Barnes Taking Active   sertraline (ZOLOFT) 100 MG tablet [id number] No Take 1 tablet (100 mg total) by mouth daily.  Taking Active   tadalafil (CIALIS) 20 MG tablet [id number] No Take 0.5-1 tablets (10-20 mg total) by mouth every other day as needed for erectile dysfunction. Matthew House, Barnes Taking Active   tirzepatide [facility]) 12.5 MG/0.5ML Pen [id number] No Inject 12.5 mg into the skin once a week.  Patient not taking: Reported on 06/10/2023   Thapa, Matthew Barnes Not Taking Active   tirzepatide [facility]) 15 MG/0.5ML Pen [id number] No Inject 15 mg into the skin once a week. Thapa, Matthew Barnes Taking Active               Assessment/Plan:   Hypertension: - Currently controlled, reports dizziness resolved -Recommend scheduling a follow up with cardio as indicated at last PCP appointment  - Reviewed long term cardiovascular and renal outcomes of uncontrolled blood pressure - Reviewed appropriate blood pressure monitoring technique and reviewed goal blood pressure. Recommended to check home blood pressure and heart rate at least once weekly - Recommend to continue current medication therapy     Follow Up Plan: as indicated following endo appt  Matthew Barnes, PharmD Clinical Pharmacist [phone]

## 2023-07-21 ENCOUNTER — Other Ambulatory Visit: Payer: Self-pay

## 2023-07-21 ENCOUNTER — Other Ambulatory Visit (HOSPITAL_BASED_OUTPATIENT_CLINIC_OR_DEPARTMENT_OTHER): Payer: Self-pay

## 2023-07-30 ENCOUNTER — Other Ambulatory Visit: Payer: Self-pay

## 2023-08-03 ENCOUNTER — Other Ambulatory Visit: Payer: Self-pay

## 2023-08-12 ENCOUNTER — Encounter: Payer: Self-pay | Admitting: Endocrinology

## 2023-08-12 ENCOUNTER — Ambulatory Visit: Payer: Medicare Other | Admitting: Endocrinology

## 2023-08-12 VITALS — BP 138/70 | HR 77 | Resp 20 | Ht 73.0 in | Wt 267.8 lb

## 2023-08-12 DIAGNOSIS — Z794 Long term (current) use of insulin: Secondary | ICD-10-CM | POA: Diagnosis not present

## 2023-08-12 DIAGNOSIS — E1165 Type 2 diabetes mellitus with hyperglycemia: Secondary | ICD-10-CM

## 2023-08-12 NOTE — Progress Notes (Signed)
 Outpatient Endocrinology Note [place] Noele Icenhour, MD  08/12/23  Patient's Name: Matthew Barnes    DOB: 04/28/1955    MRN: [id number]                                                    REASON OF VISIT: Follow up of type 2 diabetes mellitus  PCP: Aida House, MD  HISTORY OF PRESENT ILLNESS:   Matthew Barnes is a 68 y.o. old male with past medical history listed below, is here for follow up  type 2 diabetes mellitus.   Pertinent Diabetes History: He was diagnosed with type 2 diabetes mellitus in 2011.  He was initially managed with metformin  and later added on insulin  therapy due to poor control he was on Lantus  until 2015.  Around 2015 he was sent to Humalog  mix insulin  along with Victoza  to help with weight loss.  Chronic Diabetes Complications : Retinopathy: no. Last ophthalmology exam was done on annually. Nephropathy: no, on losartan . Peripheral neuropathy: no Coronary artery disease: no Stroke: no  Relevant comorbidities and cardiovascular risk factors: Obesity: yes Body mass index is 35.33 kg/m.  Hypertension: yes Hyperlipidemia. Yes, on statin  Current / Home Diabetic regimen includes:  Mounjaro  15 mg weekly.  Mounjaro  was started around March/April 2024.  Prior diabetic medications: Lantus , Levemir , Victoza , metformin  in the past.  He was taking Novolin  70/30 : 40 units daily in the morning, stopped in January 2025.  Glycemic data:  No glucose data to review.  Patient reports she has not been checking blood sugar at home.  Freestyle libre/CGM/Dexcom was prescribed in the past patient reports not covered, not able to get it.  Hypoglycemia: Patient has no hypoglycemic episodes. Patient has hypoglycemia awareness.  Factors modifying glucose control: 1.  Diabetic diet assessment: Has been eating healthy, avoiding high carbohydrate meals.  2.  Staying active or exercising: Regular exercise/biking.  3.  Medication compliance: compliant all of the time.  Interval  history  Patient was able to taper off insulin  by January 2025.  He has only been taking Mounjaro  15 mg weekly.  Denies GI issues, tolerating well.  He lost 10 to 15 pounds of weight in last 3 months.  He has occasional numbness of the feet, denies vision problem.  No other complaints today.  Lab collected for BMP, hemoglobin A1c and urine microalbumin creatinine ratio today.   REVIEW OF SYSTEMS As per history of present illness.   PAST MEDICAL HISTORY: Past Medical History:  Diagnosis Date   Bipolar II disorder - Managed by Daniellle Adegoroye [phone]) 05/03/2013   Diabetes mellitus    Dysphagia    Hyperlipidemia    Hypertension    Unspecified hypothyroidism 05/03/2013    PAST SURGICAL HISTORY: Past Surgical History:  Procedure Laterality Date   ICD IMPLANT N/A 05/20/2021   Procedure: ICD IMPLANT;  Surgeon: Tammie Fall, MD;  Location: [facility] INVASIVE CV LAB;  Service: Cardiovascular;  Laterality: N/A;   KNEE SURGERY     scope on left knee   RIGHT/LEFT HEART CATH AND CORONARY ANGIOGRAPHY N/A 08/07/2020   Procedure: RIGHT/LEFT HEART CATH AND CORONARY ANGIOGRAPHY;  Surgeon: Arleen Lacer, MD;  Location: [facility] INVASIVE CV LAB;  Service: Cardiovascular;  Laterality: N/A;    ALLERGIES: Allergies  Allergen Reactions   Influenza Vaccines Swelling and Other (See Comments)  Reports of swelling of the arm and then was "sick" for 10 days    FAMILY HISTORY:  Family History  Problem Relation Age of Onset   Diabetes Mother    Hypertension Mother    Heart disease Mother    Heart disease Father    Heart attack Father 86   Diabetes Father    Hypertension Father    Depression Father    Diabetes Sister    Depression Paternal Grandmother    Depression Other     SOCIAL HISTORY: Social History   Socioeconomic History   Marital status: Single    Spouse name: Not on file   Number of children: Not on file   Years of education: Not on file   Highest education level: GED or  equivalent  Occupational History   Not on file  Tobacco Use   Smoking status: Never   Smokeless tobacco: Never  Vaping Use   Vaping status: Never Used  Substance and Sexual Activity   Alcohol use: No   Drug use: No   Sexual activity: Not Currently  Other Topics Concern   Not on file  Social History Narrative   Work or School: on disability for knee arthritis      Home Situation: lives alone      Spiritual Beliefs:Christian      Lifestyle:no regular exercising; diet not great            Social Drivers of Health   Financial Resource Strain: Low Risk  (01/02/2023)   Overall Financial Resource Strain (CARDIA)    Difficulty of Paying Living Expenses: Not hard at all  Food Insecurity: No Food Insecurity (01/02/2023)   Hunger Vital Sign    Worried About Running Out of Food in the Last Year: Never true    Ran Out of Food in the Last Year: Never true  Transportation Needs: No Transportation Needs (01/02/2023)   PRAPARE - Administrator, Civil Service (Medical): No    Lack of Transportation (Non-Medical): No  Physical Activity: Sufficiently Active (01/02/2023)   Exercise Vital Sign    Days of Exercise per Week: 5 days    Minutes of Exercise per Session: 30 min  Stress: No Stress Concern Present (01/02/2023)   [employer] of Occupational Health - Occupational Stress Questionnaire    Feeling of Stress : Not at all  Social Connections: Socially Isolated (01/02/2023)   Social Connection and Isolation Panel [NHANES]    Frequency of Communication with Friends and Family: Twice a week    Frequency of Social Gatherings with Friends and Family: Once a week    Attends Religious Services: Never    Database administrator or Organizations: No    Attends Engineer, structural: Not on file    Marital Status: Divorced    MEDICATIONS:  Current Outpatient Medications  Medication Sig Dispense Refill   ACCU-CHEK GUIDE test strip USE TO check blood glucose twice daily  AS DIRECTED 100 strip 3   atorvastatin  (LIPITOR) 20 MG tablet Take 1 tablet (20 mg total) by mouth daily at bedtime 90 tablet 1   bacitracin  500 UNIT/GM ointment Apply 1 Application topically 2 (two) times daily. 120 g 0   carvedilol  (COREG ) 6.25 MG tablet Take 1 tablet (6.25 mg total) by mouth 2 (two) times daily at breakfast and at bedtime. 180 tablet 1   digoxin  (LANOXIN ) 0.125 MG tablet Take 1 tablet (0.125 mg total) by mouth at bedtime. 90 tablet 3  furosemide  (LASIX ) 20 MG tablet Take 1 tablet (20 mg total) by mouth every evening. 90 tablet 1   losartan  (COZAAR ) 50 MG tablet Take 1 tablet (50 mg total) by mouth daily. 90 tablet 1   meclizine  (ANTIVERT ) 25 MG tablet Take 0.5-1 tablets (12.5-25 mg total) by mouth 3 (three) times daily as needed for dizziness. 30 tablet 2   omeprazole  (PRILOSEC) 20 MG capsule Take 1 capsule (20 mg total) by mouth daily. 90 capsule 1   QUEtiapine  (SEROQUEL ) 200 MG tablet Take 3 tablets (600 mg total) by mouth at bedtime. 270 tablet 2   QUEtiapine  (SEROQUEL ) 200 MG tablet Take 2-3 tablets by mouth at bedtime as directed 270 tablet 2   sertraline  (ZOLOFT ) 100 MG tablet Take 1 tablet (100 mg total) by mouth daily. 90 tablet 1   sertraline  (ZOLOFT ) 100 MG tablet Take 1 tablet (100 mg total) by mouth daily. 90 tablet 2   tadalafil  (CIALIS ) 20 MG tablet Take 0.5-1 tablets (10-20 mg total) by mouth every other day as needed for erectile dysfunction. 10 tablet 3   tirzepatide  (MOUNJARO ) 12.5 MG/0.5ML Pen Inject 12.5 mg into the skin once a week. 6 mL 3   tirzepatide  (MOUNJARO ) 15 MG/0.5ML Pen Inject 15 mg into the skin once a week. 6 mL 4   Accu-Chek Softclix Lancets lancets Check blood sugar 2 times daily (Patient not taking: Reported on 08/12/2023) 100 each 3   Blood Glucose Monitoring Suppl (ACCU-CHEK GUIDE) w/Device KIT Check blood sugar 2 times daily (Patient not taking: Reported on 08/12/2023) 1 kit 0   glucose blood (ACCU-CHEK GUIDE) test strip Use to check blood  sugar 2 times daily (Patient not taking: Reported on 08/12/2023) 100 each 3   No current facility-administered medications for this visit.    PHYSICAL EXAM: Vitals:   08/12/23 1007 08/12/23 1008  BP: (!) 146/80 138/70  Pulse: 77   Resp: 20   SpO2: 98%   Weight: 267 lb 12.8 oz (121.5 kg)   Height: 6\' 1"  (1.854 m)     Body mass index is 35.33 kg/m.  Wt Readings from Last 3 Encounters:  08/12/23 267 lb 12.8 oz (121.5 kg)  05/25/23 273 lb 1.6 oz (123.9 kg)  02/17/23 279 lb (126.6 kg)    General: Well developed, well nourished male in no apparent distress.  HEENT: AT/[place], no external lesions.  Eyes: Conjunctiva clear and no icterus. Neurologic: Alert, oriented, normal speech Psychiatric: Does not appear depressed or anxious  Diabetic Foot Exam - Simple   Simple Foot Form Diabetic Foot exam was performed with the following findings: Yes 08/12/2023 10:19 AM  Visual Inspection See comments: Yes Sensation Testing See comments: Yes Pulse Check Posterior Tibialis and Dorsalis pulse intact bilaterally: Yes Comments Hammer toes deformity bilaterally. Monofilament sensation diminished bilaterally on toes and intact on soles.      LABS Reviewed Lab Results  Component Value Date   HGBA1C 6.2 10/28/2022   HGBA1C 7.0 (H) 08/01/2022   HGBA1C 8.2 (H) 05/19/2022   Lab Results  Component Value Date   FRUCTOSAMINE 305 (H) 01/30/2022   FRUCTOSAMINE 290 (H) 08/15/2021   FRUCTOSAMINE 301 (H) 06/18/2021   Lab Results  Component Value Date   CHOL 96 11/07/2022   HDL 22.00 (L) 11/07/2022   LDLCALC 38 10/28/2021   LDLDIRECT 53.0 11/07/2022   TRIG 257.0 (H) 11/07/2022   CHOLHDL 4 11/07/2022   Lab Results  Component Value Date   MICRALBCREAT 1.3 08/01/2022   MICRALBCREAT 3.0 02/28/2021  Lab Results  Component Value Date   CREATININE 1.50 10/28/2022   Lab Results  Component Value Date   GFR 48.17 (L) 10/28/2022    ASSESSMENT / PLAN  1. Type 2 diabetes mellitus with  hyperglycemia, with long-term current use of insulin  (HCC)     Diabetes Mellitus type 2, no known complications.  - Diabetic status / severity: Controlled  Lab Results  Component Value Date   HGBA1C 6.2 10/28/2022    - Hemoglobin A1c goal : <6.5%  - Medications:   No change.  I) continue Mounjaro  15 mg weekly.  Follow-up on result of hemoglobin A1c, today.  Expect to have controlled diabetes with acceptable hemoglobin A1c.   - Home glucose testing: Advised to check blood sugar in the morning fasting at least few times a week and asked to bring glucometer in the follow-up visit.  - Discussed/ Gave Hypoglycemia treatment plan.  # Consult : not required at this time.   # Annual urine for microalbuminuria/ creatinine ratio, no microalbuminuria currently, continue ACE/ARB on losartan .  Follow-up result of BMP and urine microalbumin creatinine ratio collected today. Last  Lab Results  Component Value Date   MICRALBCREAT 1.3 08/01/2022    # Foot check nightly.  # Annual dilated diabetic eye exams.   - Diet: Make healthy diabetic food choices - Life style / activity / exercise: Discussed.  2. Blood pressure  -  BP Readings from Last 1 Encounters:  08/12/23 138/70    - Control is in target.  - No change in current plans.  3. Lipid status / Hyperlipidemia - Last  Lab Results  Component Value Date   LDLCALC 38 10/28/2021   - Continue atorvastatin  20 mg daily.  Diagnoses and all orders for this visit:  Type 2 diabetes mellitus with hyperglycemia, with long-term current use of insulin  (HCC) -     Microalbumin / creatinine urine ratio   DISPOSITION Follow up in clinic in 6 months suggested.   All questions answered and patient verbalized understanding of the plan.  Iraq Iran Rowe, MD [facility] [address] [place], [address] [place], [place] [address] Phone # [phone]  At least part of this note was generated using voice  recognition software. Inadvertent word errors may have occurred, which were not recognized during the proofreading process.

## 2023-08-13 LAB — MICROALBUMIN / CREATININE URINE RATIO
Creatinine, Urine: 121 mg/dL (ref 20–320)
Microalb Creat Ratio: 13 mg/g{creat} (ref <30)
Microalb, Ur: 1.6 mg/dL

## 2023-08-13 LAB — BASIC METABOLIC PANEL WITH GFR
BUN: 11 mg/dL (ref 7–25)
CO2: 31 mmol/L (ref 20–32)
Calcium: 9 mg/dL (ref 8.6–10.3)
Chloride: 103 mmol/L (ref 98–110)
Creat: 1.21 mg/dL (ref 0.70–1.35)
Glucose, Bld: 119 mg/dL — ABNORMAL HIGH (ref 65–99)
Potassium: 4.2 mmol/L (ref 3.5–5.3)
Sodium: 141 mmol/L (ref 135–146)
eGFR: 66 mL/min/{1.73_m2} (ref >=60)

## 2023-08-13 LAB — HEMOGLOBIN A1C
Hgb A1c MFr Bld: 6.6 % — ABNORMAL HIGH (ref <5.7)
Mean Plasma Glucose: 143 mg/dL
eAG (mmol/L): 7.9 mmol/L

## 2023-08-17 ENCOUNTER — Other Ambulatory Visit: Payer: Self-pay

## 2023-08-17 ENCOUNTER — Other Ambulatory Visit (HOSPITAL_BASED_OUTPATIENT_CLINIC_OR_DEPARTMENT_OTHER): Payer: Self-pay

## 2023-08-17 ENCOUNTER — Ambulatory Visit: Payer: Self-pay | Admitting: Endocrinology

## 2023-08-17 ENCOUNTER — Other Ambulatory Visit: Payer: Self-pay | Admitting: Family Medicine

## 2023-08-17 DIAGNOSIS — I5022 Chronic systolic (congestive) heart failure: Secondary | ICD-10-CM

## 2023-08-17 MED ORDER — DIGOXIN 125 MCG PO TABS
0.1250 mg | ORAL_TABLET | Freq: Every day | ORAL | 0 refills | Status: DC
  Filled 2023-08-25 – 2023-09-08 (×2): qty 30, 30d supply, fill #0
  Filled 2023-09-28 – 2023-10-08 (×2): qty 30, 30d supply, fill #1
  Filled 2023-11-03 – 2023-11-05 (×3): qty 30, 30d supply, fill #2

## 2023-08-25 ENCOUNTER — Other Ambulatory Visit: Payer: Self-pay | Admitting: Family Medicine

## 2023-08-25 ENCOUNTER — Other Ambulatory Visit: Payer: Self-pay

## 2023-08-25 ENCOUNTER — Other Ambulatory Visit (HOSPITAL_BASED_OUTPATIENT_CLINIC_OR_DEPARTMENT_OTHER): Payer: Self-pay

## 2023-08-25 ENCOUNTER — Ambulatory Visit (INDEPENDENT_AMBULATORY_CARE_PROVIDER_SITE_OTHER): Payer: Medicare HMO

## 2023-08-25 DIAGNOSIS — I5022 Chronic systolic (congestive) heart failure: Secondary | ICD-10-CM

## 2023-08-25 DIAGNOSIS — I420 Dilated cardiomyopathy: Secondary | ICD-10-CM | POA: Diagnosis not present

## 2023-08-25 DIAGNOSIS — I5042 Chronic combined systolic (congestive) and diastolic (congestive) heart failure: Secondary | ICD-10-CM

## 2023-08-25 NOTE — Telephone Encounter (Signed)
 Message sent to PCP to approve change as below

## 2023-08-26 ENCOUNTER — Other Ambulatory Visit (HOSPITAL_BASED_OUTPATIENT_CLINIC_OR_DEPARTMENT_OTHER): Payer: Self-pay

## 2023-08-26 ENCOUNTER — Other Ambulatory Visit: Payer: Self-pay

## 2023-08-26 LAB — CUP PACEART REMOTE DEVICE CHECK
Battery Remaining Longevity: 120 mo
Battery Voltage: 2.98 V
Brady Statistic RV Percent Paced: 0.01 %
Date Time Interrogation Session: 20250527223627
HighPow Impedance: 80 Ohm
Implantable Lead Connection Status: 753985
Implantable Lead Implant Date: 20230220
Implantable Lead Location: 753860
Implantable Lead Model: 6935
Implantable Pulse Generator Implant Date: 20230220
Lead Channel Impedance Value: 513 Ohm
Lead Channel Impedance Value: 608 Ohm
Lead Channel Pacing Threshold Amplitude: 0.5 V
Lead Channel Pacing Threshold Pulse Width: 0.4 ms
Lead Channel Sensing Intrinsic Amplitude: 8 mV
Lead Channel Sensing Intrinsic Amplitude: 8 mV
Lead Channel Setting Pacing Amplitude: 2 V
Lead Channel Setting Pacing Pulse Width: 0.4 ms
Lead Channel Setting Sensing Sensitivity: 0.45 mV
Zone Setting Status: 755011
Zone Setting Status: 755011

## 2023-08-26 MED ORDER — DIGOXIN 125 MCG PO TABS
0.1250 mg | ORAL_TABLET | Freq: Every day | ORAL | Status: DC
Start: 2023-08-26 — End: 2023-11-03

## 2023-08-26 NOTE — Telephone Encounter (Signed)
 Spoke with Brianna and informed her of the approval as below.

## 2023-08-26 NOTE — Telephone Encounter (Signed)
 Ok to change

## 2023-08-28 ENCOUNTER — Ambulatory Visit: Payer: Self-pay | Admitting: Internal Medicine

## 2023-08-28 DIAGNOSIS — M9901 Segmental and somatic dysfunction of cervical region: Secondary | ICD-10-CM | POA: Diagnosis not present

## 2023-08-28 DIAGNOSIS — M9905 Segmental and somatic dysfunction of pelvic region: Secondary | ICD-10-CM | POA: Diagnosis not present

## 2023-08-28 DIAGNOSIS — M9904 Segmental and somatic dysfunction of sacral region: Secondary | ICD-10-CM | POA: Diagnosis not present

## 2023-08-28 DIAGNOSIS — M9903 Segmental and somatic dysfunction of lumbar region: Secondary | ICD-10-CM | POA: Diagnosis not present

## 2023-09-01 DIAGNOSIS — M9905 Segmental and somatic dysfunction of pelvic region: Secondary | ICD-10-CM | POA: Diagnosis not present

## 2023-09-01 DIAGNOSIS — M9901 Segmental and somatic dysfunction of cervical region: Secondary | ICD-10-CM | POA: Diagnosis not present

## 2023-09-01 DIAGNOSIS — M9903 Segmental and somatic dysfunction of lumbar region: Secondary | ICD-10-CM | POA: Diagnosis not present

## 2023-09-01 DIAGNOSIS — M9904 Segmental and somatic dysfunction of sacral region: Secondary | ICD-10-CM | POA: Diagnosis not present

## 2023-09-08 ENCOUNTER — Other Ambulatory Visit (HOSPITAL_BASED_OUTPATIENT_CLINIC_OR_DEPARTMENT_OTHER): Payer: Self-pay

## 2023-09-08 ENCOUNTER — Other Ambulatory Visit: Payer: Self-pay

## 2023-09-08 ENCOUNTER — Other Ambulatory Visit (HOSPITAL_COMMUNITY): Payer: Self-pay

## 2023-09-09 ENCOUNTER — Other Ambulatory Visit: Payer: Self-pay

## 2023-09-14 ENCOUNTER — Telehealth: Payer: Self-pay

## 2023-09-14 ENCOUNTER — Telehealth: Payer: Self-pay | Admitting: Family Medicine

## 2023-09-14 NOTE — Telephone Encounter (Signed)
 See prior phone note.

## 2023-09-14 NOTE — Telephone Encounter (Signed)
 Agent stated Camilo Cella from [employer] is checking on the status of a fax that was sent over to the office. They haven't heard anything concerning getting pt their [facility] stuff; waiting for Dr to approve.   Agent didn't provide number.   Please advise.

## 2023-09-14 NOTE — Telephone Encounter (Signed)
 Copied from CRM [id number]. Topic: General - Other >> Sep 14, 2023 12:42 PM Aisha D wrote: Reason for CRM:  [employer] called to check if fax was received for Jerrilyn Moras- contacted [facility]- Kristeen Peto advise the fax was received PCP has to fill it out.  UPDATE: Camilo Cella with [employer] is calling back to check the status of the requested documents. Jessica's callback number is [phone].

## 2023-09-14 NOTE — Telephone Encounter (Signed)
 Spoke with the patient to confirm he is using this company as another patient states this could be a SCAM as they did not request this company for medical supplies.  Patient stated he spoke with someone from this company as they are trying to get a glucose monitor for him through endo.  I advised the patient he would need to let this company know to send any faxes to the endo office instead and he agreed.

## 2023-09-21 ENCOUNTER — Other Ambulatory Visit (HOSPITAL_BASED_OUTPATIENT_CLINIC_OR_DEPARTMENT_OTHER): Payer: Self-pay

## 2023-09-22 ENCOUNTER — Other Ambulatory Visit (HOSPITAL_BASED_OUTPATIENT_CLINIC_OR_DEPARTMENT_OTHER): Payer: Self-pay

## 2023-09-25 ENCOUNTER — Emergency Department (HOSPITAL_BASED_OUTPATIENT_CLINIC_OR_DEPARTMENT_OTHER)
Admission: EM | Admit: 2023-09-25 | Discharge: 2023-09-25 | Disposition: A | Discharge status: Home / Self Care | Attending: Emergency Medicine | Admitting: Emergency Medicine

## 2023-09-25 ENCOUNTER — Other Ambulatory Visit: Payer: Self-pay

## 2023-09-25 ENCOUNTER — Encounter (HOSPITAL_BASED_OUTPATIENT_CLINIC_OR_DEPARTMENT_OTHER): Payer: Self-pay | Admitting: Emergency Medicine

## 2023-09-25 DIAGNOSIS — R42 Dizziness and giddiness: Secondary | ICD-10-CM | POA: Diagnosis not present

## 2023-09-25 DIAGNOSIS — R7989 Other specified abnormal findings of blood chemistry: Secondary | ICD-10-CM | POA: Diagnosis not present

## 2023-09-25 DIAGNOSIS — I2510 Atherosclerotic heart disease of native coronary artery without angina pectoris: Secondary | ICD-10-CM | POA: Diagnosis not present

## 2023-09-25 DIAGNOSIS — E1122 Type 2 diabetes mellitus with diabetic chronic kidney disease: Secondary | ICD-10-CM | POA: Insufficient documentation

## 2023-09-25 DIAGNOSIS — N189 Chronic kidney disease, unspecified: Secondary | ICD-10-CM | POA: Insufficient documentation

## 2023-09-25 DIAGNOSIS — I951 Orthostatic hypotension: Secondary | ICD-10-CM | POA: Diagnosis not present

## 2023-09-25 DIAGNOSIS — Z79899 Other long term (current) drug therapy: Secondary | ICD-10-CM | POA: Insufficient documentation

## 2023-09-25 DIAGNOSIS — E039 Hypothyroidism, unspecified: Secondary | ICD-10-CM | POA: Diagnosis not present

## 2023-09-25 DIAGNOSIS — I130 Hypertensive heart and chronic kidney disease with heart failure and stage 1 through stage 4 chronic kidney disease, or unspecified chronic kidney disease: Secondary | ICD-10-CM | POA: Insufficient documentation

## 2023-09-25 DIAGNOSIS — I509 Heart failure, unspecified: Secondary | ICD-10-CM | POA: Insufficient documentation

## 2023-09-25 LAB — CBC WITH DIFFERENTIAL/PLATELET
Abs Immature Granulocytes: 0.02 10*3/uL (ref 0.00–0.07)
Basophils Absolute: 0 10*3/uL (ref 0.0–0.1)
Basophils Relative: 1 %
Eosinophils Absolute: 0.2 10*3/uL (ref 0.0–0.5)
Eosinophils Relative: 3 %
HCT: 40.1 % (ref 39.0–52.0)
Hemoglobin: 13.4 g/dL (ref 13.0–17.0)
Immature Granulocytes: 0 %
Lymphocytes Relative: 29 %
Lymphs Abs: 1.6 10*3/uL (ref 0.7–4.0)
MCH: 26.8 pg (ref 26.0–34.0)
MCHC: 33.4 g/dL (ref 30.0–36.0)
MCV: 80.2 fL (ref 80.0–100.0)
Monocytes Absolute: 0.4 10*3/uL (ref 0.1–1.0)
Monocytes Relative: 7 %
Neutro Abs: 3.3 10*3/uL (ref 1.7–7.7)
Neutrophils Relative %: 60 %
Platelets: 119 10*3/uL — ABNORMAL LOW (ref 150–400)
RBC: 5 MIL/uL (ref 4.22–5.81)
RDW: 13.2 % (ref 11.5–15.5)
WBC: 5.4 10*3/uL (ref 4.0–10.5)
nRBC: 0 % (ref 0.0–0.2)

## 2023-09-25 LAB — BASIC METABOLIC PANEL WITH GFR
Anion gap: 10 (ref 5–15)
BUN: 13 mg/dL (ref 8–23)
CO2: 27 mmol/L (ref 22–32)
Calcium: 9.4 mg/dL (ref 8.9–10.3)
Chloride: 103 mmol/L (ref 98–111)
Creatinine, Ser: 1.43 mg/dL — ABNORMAL HIGH (ref 0.61–1.24)
GFR, Estimated: 54 mL/min — ABNORMAL LOW (ref >60)
Glucose, Bld: 166 mg/dL — ABNORMAL HIGH (ref 70–99)
Potassium: 3.8 mmol/L (ref 3.5–5.1)
Sodium: 140 mmol/L (ref 135–145)

## 2023-09-25 MED ORDER — SODIUM CHLORIDE 0.9 % IV BOLUS
500.0000 mL | Freq: Once | INTRAVENOUS | Status: AC
Start: 2023-09-25 — End: 2023-09-25
  Administered 2023-09-25: 500 mL via INTRAVENOUS

## 2023-09-25 NOTE — Discharge Instructions (Addendum)
 It was a pleasure caring for you today.  Please follow-up with your outpatient cardiologist.  Seek emergency care if experiencing any new or worsening symptoms.

## 2023-09-25 NOTE — ED Notes (Signed)
 Pt stated he felt fine throughout orthostatic vitals but only experienced dizziness upon standing for the first set of orthostatic vitals. Pt stated I'm only dizzy when I get up, after I stand for a while I feel fine. Orthostatic vitals charted by this NT.

## 2023-09-25 NOTE — ED Provider Notes (Signed)
 [place] EMERGENCY DEPARTMENT AT [facility] Provider Note   CSN: [id number] Arrival date & time: 09/25/23  1029     Patient presents with: Dizziness   Matthew Barnes is a 68 y.o. male with PMHx bipolar II disorder, hypothyroidism, DM, HLD, HTN, CHF, CKD, CAD s/p ICD who presents to ED concerned for intermittent episodes of lightheadedness when standing up that has been present for several months. Lightheadedness resolves after 60 seconds of standing. Denies vertigo, headaches or vision changes during episodes. Patient states that he stays hydrated and eats good. Patient started taking Zoloft  around the same time that these symptoms started and is wondering if this is related.   Patient stood in ED without symptoms of lightheadedness during my initial interview.  Denies fever, chest pain, dyspnea, cough, nausea, vomiting, diarrhea, dysuria, hematuria, hematochezia. Denies syncope.     Dizziness      Prior to Admission medications   Medication Sig Start Date End Date Taking? Authorizing Provider  ACCU-CHEK GUIDE test strip USE TO check blood glucose twice daily AS DIRECTED 11/17/22   Von Pacific, MD  Accu-Chek Softclix Lancets lancets Check blood sugar 2 times daily Patient not taking: Reported on 08/12/2023 10/08/22   Von Pacific, MD  atorvastatin  (LIPITOR) 20 MG tablet Take 1 tablet (20 mg total) by mouth daily at bedtime 05/25/23   Ozell Heron CHRISTELLA, MD  bacitracin  500 UNIT/GM ointment Apply 1 Application topically 2 (two) times daily. 12/10/22   Silver Wonda LABOR, PA  Blood Glucose Monitoring Suppl (ACCU-CHEK GUIDE) w/Device KIT Check blood sugar 2 times daily Patient not taking: Reported on 08/12/2023 10/14/22   Von Pacific, MD  carvedilol  (COREG ) 6.25 MG tablet Take 1 tablet (6.25 mg total) by mouth 2 (two) times daily at breakfast and at bedtime. 06/04/23   Ozell Heron CHRISTELLA, MD  digoxin  (LANOXIN ) 0.125 MG tablet Take 1 tablet (0.125 mg total) by mouth at bedtime.  08/17/23   Ozell Heron CHRISTELLA, MD  digoxin  (LANOXIN ) 0.125 MG tablet Take 1 tablet (0.125 mg total) by mouth at bedtime. 08/26/23   Ozell Heron CHRISTELLA, MD  furosemide  (LASIX ) 20 MG tablet Take 1 tablet (20 mg total) by mouth every evening. 05/25/23   Ozell Heron CHRISTELLA, MD  glucose blood (ACCU-CHEK GUIDE) test strip Use to check blood sugar 2 times daily Patient not taking: Reported on 08/12/2023 12/16/21     losartan  (COZAAR ) 50 MG tablet Take 1 tablet (50 mg total) by mouth daily. 05/25/23   Ozell Heron CHRISTELLA, MD  meclizine  (ANTIVERT ) 25 MG tablet Take 0.5-1 tablets (12.5-25 mg total) by mouth 3 (three) times daily as needed for dizziness. 06/10/23   Ozell Heron CHRISTELLA, MD  omeprazole  (PRILOSEC) 20 MG capsule Take 1 capsule (20 mg total) by mouth daily. 05/25/23   Ozell Heron CHRISTELLA, MD  QUEtiapine  (SEROQUEL ) 200 MG tablet Take 3 tablets (600 mg total) by mouth at bedtime. 09/30/22     QUEtiapine  (SEROQUEL ) 200 MG tablet Take 2-3 tablets by mouth at bedtime as directed 04/23/22     sertraline  (ZOLOFT ) 100 MG tablet Take 1 tablet (100 mg total) by mouth daily. 03/18/23   Ozell Heron CHRISTELLA, MD  sertraline  (ZOLOFT ) 100 MG tablet Take 1 tablet (100 mg total) by mouth daily. 03/18/23     tadalafil  (CIALIS ) 20 MG tablet Take 0.5-1 tablets (10-20 mg total) by mouth every other day as needed for erectile dysfunction. 01/05/23   Ozell Heron CHRISTELLA, MD  tirzepatide  (MOUNJARO ) 12.5 MG/0.5ML Pen Inject 12.5 mg into  the skin once a week. 05/14/23   Thapa, [place], MD  tirzepatide  (MOUNJARO ) 15 MG/0.5ML Pen Inject 15 mg into the skin once a week. 05/14/23   Thapa, [place], MD    Allergies: Influenza vaccines    Review of Systems  Neurological:  Positive for dizziness.    Updated Vital Signs BP 116/69 (BP Location: Right Arm)   Pulse 87   Temp 97.9 F (36.6 C)   Resp 18   Ht 6' 1 (1.854 m)   Wt 113.4 kg   SpO2 97%   BMI 32.98 kg/m   Physical Exam Vitals and nursing note reviewed.  Constitutional:       General: He is not in acute distress.    Appearance: He is not ill-appearing or toxic-appearing.  HENT:     Head: Normocephalic and atraumatic.     Mouth/Throat:     Mouth: Mucous membranes are moist.   Eyes:     General: No scleral icterus.       Right eye: No discharge.        Left eye: No discharge.     Conjunctiva/sclera: Conjunctivae normal.    Cardiovascular:     Rate and Rhythm: Normal rate and regular rhythm.     Pulses: Normal pulses.     Heart sounds: Normal heart sounds. No murmur heard. Pulmonary:     Effort: Pulmonary effort is normal. No respiratory distress.     Breath sounds: Normal breath sounds. No wheezing, rhonchi or rales.  Abdominal:     General: Abdomen is flat. There is no distension.     Palpations: Abdomen is soft. There is no mass.     Tenderness: There is no abdominal tenderness.   Musculoskeletal:     Right lower leg: No edema.     Left lower leg: No edema.   Skin:    General: Skin is warm and dry.     Findings: No rash.   Neurological:     General: No focal deficit present.     Mental Status: He is alert. Mental status is at baseline.     Comments: GCS 15. Speech is goal oriented. No deficits appreciated to CN III-XII. Patient moves extremities without ataxia. Patient ambulatory with steady gait.   Psychiatric:        Mood and Affect: Mood normal.     (all labs ordered are listed, but only abnormal results are displayed) Labs Reviewed  CBC WITH DIFFERENTIAL/PLATELET - Abnormal; Notable for the following components:      Result Value   Platelets 119 (*)    All other components within normal limits  BASIC METABOLIC PANEL WITH GFR - Abnormal; Notable for the following components:   Glucose, Bld 166 (*)    Creatinine, Ser 1.43 (*)    GFR, Estimated 54 (*)    All other components within normal limits    EKG: EKG Interpretation Date/Time:  Friday September 25 2023 10:40:48 EDT Ventricular Rate:  88 PR Interval:  172 QRS  Duration:  96 QT Interval:  318 QTC Calculation: 385 R Axis:   11  Text Interpretation: Sinus rhythm Low voltage, precordial leads Repol abnrm suggests ischemia, lateral leads T-wave flattening inferiorly and septally, otherwise no signifcant change Confirmed by Ellouise Fine ([id number]) on 09/25/2023 11:39:25 AM  Radiology: No results found.   Procedures   Medications Ordered in the ED  sodium chloride  0.9 % bolus 500 mL (500 mLs Intravenous New Bag/Given 09/25/23 1235)  Medical Decision Making Amount and/or Complexity of Data Reviewed Labs: ordered.   his patient presents to the ED for concern of dizziness, this involves an extensive number of treatment options, and is a complaint that carries with it a high risk of complications and morbidity.  The differential diagnosis includes CVA, ICH, intracranial mass, critical dehydration, heptatic dysfunction, uremia, hypercarbia, intoxication/withdrawal, endocrine abnormality, sepsis/infection, vestibular neuritis, peripheral vertigo.   Co morbidities that complicate the patient evaluation   bipolar II disorder, hypothyroidism, DM, HLD, HTN, CHF, CKD, CAD s/p ICD   Additional history obtained:  08/26/2023 CUP paceart remote device check: Remote Defibrillator interrogation reviewed. Presenting Rhythm:V-sensed. Battery and lead parameters stable with stable capture and sensing. Device programming is appropriate. No arrhythmias noted. Continue remote monitoring.  2023 ECHO: 20-25% EF  Problem List / ED Course / Critical interventions / Medication management  Patient presented for intermittent episodes of lightheadedness when standing. Orthostatic vitals concerning for hypotension at 76/56 standing at 0 min. Rest of physical exam reassuring.  Patient afebrile with stable vitals. I Ordered, and personally interpreted labs.  CBC without leukocytosis or anemia.  BMP with slight elevation in creatinine at  1.43. The patient was maintained on a cardiac monitor.  I personally viewed and interpreted the cardiac monitored which showed an underlying rhythm of: sinus rhythm. Provided patient with 500ml IV fluids. Patient's orthostatics now reassuring. Patient stating that he has not met with cardiologist in many years. Cardiology telephone note from 05/28/2023 stating that his cardiologist has not seen him in 6 years. I will submit a new cardiology referral for the patient to ensure appropriate follow up with his cardiologist.  Patient educated on wearing compression stockings for BP support.  Patient verbalized understanding of plan.  Staffed with Dr. Ellouise  I have reviewed the patients home medicines and have made adjustments as needed The patient has been appropriately medically screened and/or stabilized in the ED. I have low suspicion for any other emergent medical condition which would require further screening, evaluation or treatment in the ED or require inpatient management. At time of discharge the patient is hemodynamically stable and in no acute distress. I have discussed work-up results and diagnosis with patient and answered all questions. Patient is agreeable with discharge plan. We discussed strict return precautions for returning to the emergency department and they verbalized understanding.     Social Determinants of Health:  none      Final diagnoses:  Lightheadedness  Orthostatic hypotension    ED Discharge Orders          Ordered    Ambulatory referral to Cardiology       Comments: If you have not heard from the Cardiology office within the next 72 hours please call [phone].   09/25/23 1245               Hoy Nidia FALCON, [place] 09/25/23 1418    Ellouise Richerd POUR, [place] 09/25/23 1528

## 2023-09-25 NOTE — ED Triage Notes (Signed)
 Pt caox4 ambulatory NAD c/o dizziness when  standing up that has been going on for several months. Denies SOB, CP, syncope.

## 2023-09-28 ENCOUNTER — Other Ambulatory Visit (HOSPITAL_BASED_OUTPATIENT_CLINIC_OR_DEPARTMENT_OTHER): Payer: Self-pay

## 2023-09-28 ENCOUNTER — Other Ambulatory Visit: Payer: Self-pay

## 2023-09-29 ENCOUNTER — Other Ambulatory Visit: Payer: Self-pay

## 2023-09-29 ENCOUNTER — Telehealth: Payer: Self-pay

## 2023-09-29 ENCOUNTER — Other Ambulatory Visit (HOSPITAL_BASED_OUTPATIENT_CLINIC_OR_DEPARTMENT_OTHER): Payer: Self-pay

## 2023-09-29 MED ORDER — SERTRALINE HCL 100 MG PO TABS
100.0000 mg | ORAL_TABLET | Freq: Every day | ORAL | 1 refills | Status: DC
  Filled 2023-11-03: qty 90, 90d supply, fill #0

## 2023-09-29 MED ORDER — QUETIAPINE FUMARATE 200 MG PO TABS
600.0000 mg | ORAL_TABLET | Freq: Every evening | ORAL | 1 refills | Status: DC
  Filled 2023-10-08: qty 90, 30d supply, fill #0
  Filled 2023-11-03: qty 90, 30d supply, fill #1

## 2023-09-29 NOTE — Transitions of Care (Post Inpatient/ED Visit) (Signed)
 09/29/2023  Name: Matthew Barnes MRN: [id number] DOB: April 27, 1955  Today's TOC FU Call Status: Today's TOC FU Call Status:: Successful TOC FU Call Completed TOC FU Call Complete Date: 09/29/23 Patient's Name and Date of Birth confirmed.  Transition Care Management Follow-up Telephone Call Date of Discharge: 09/25/23 Discharge Facility: [facility] ([facility]) Type of Discharge: Emergency Department Reason for ED Visit: Other: (Diziness) How have you been since you were released from the hospital?: Better Any questions or concerns?: No  Items Reviewed: Did you receive and understand the discharge instructions provided?: Yes Medications obtained,verified, and reconciled?: Yes (Medications Reviewed) Any new allergies since your discharge?: No Dietary orders reviewed?: No Do you have support at home?: No  Medications Reviewed Today: Medications Reviewed Today     Reviewed by Brien Rea LABOR, CMA (Certified Medical Assistant) on 09/29/23 at 1054  Med List Status: <None>   Medication Order Taking? Sig Documenting Provider Last Dose Status Informant  ACCU-CHEK GUIDE test strip [id number] Yes USE TO check blood glucose twice daily AS DIRECTED Von Pacific, MD  Active   Accu-Chek Softclix Lancets lancets [id number] Yes Check blood sugar 2 times daily Von Pacific, MD  Active   atorvastatin  (LIPITOR) 20 MG tablet [id number] Yes Take 1 tablet (20 mg total) by mouth daily at bedtime Ozell Heron CHRISTELLA, MD  Active   bacitracin  500 UNIT/GM ointment [id number] Yes Apply 1 Application topically 2 (two) times daily. Silver Wonda LABOR, PA  Active   Blood Glucose Monitoring Suppl (ACCU-CHEK GUIDE) w/Device PRESSLEY [id number] Yes Check blood sugar 2 times daily Von Pacific, MD  Active   carvedilol  (COREG ) 6.25 MG tablet [id number] Yes Take 1 tablet (6.25 mg total) by mouth 2 (two) times daily at breakfast and at bedtime. Ozell Heron CHRISTELLA, MD  Active   digoxin  (LANOXIN ) 0.125 MG tablet [id number] Yes Take 1  tablet (0.125 mg total) by mouth at bedtime. Ozell Heron CHRISTELLA, MD  Active   digoxin  (LANOXIN ) 0.125 MG tablet [id number] Yes Take 1 tablet (0.125 mg total) by mouth at bedtime. Ozell Heron CHRISTELLA, MD  Active   furosemide  (LASIX ) 20 MG tablet [id number] Yes Take 1 tablet (20 mg total) by mouth every evening. Ozell Heron CHRISTELLA, MD  Active   glucose blood (ACCU-CHEK GUIDE) test strip [id number] Yes Use to check blood sugar 2 times daily   Active   losartan  (COZAAR ) 50 MG tablet [id number] Yes Take 1 tablet (50 mg total) by mouth daily. Ozell Heron CHRISTELLA, MD  Active   meclizine  (ANTIVERT ) 25 MG tablet [id number] Yes Take 0.5-1 tablets (12.5-25 mg total) by mouth 3 (three) times daily as needed for dizziness. Ozell Heron CHRISTELLA, MD  Active   omeprazole  [facility]) 20 MG capsule [id number] Yes Take 1 capsule (20 mg total) by mouth daily. Ozell Heron CHRISTELLA, MD  Active   QUEtiapine  (SEROQUEL ) 200 MG tablet [id number] Yes Take 3 tablets (600 mg total) by mouth at bedtime.   Active   QUEtiapine  (SEROQUEL ) 200 MG tablet [id number] Yes Take 2-3 tablets by mouth at bedtime as directed   Active   sertraline  (ZOLOFT ) 100 MG tablet [id number] Yes Take 1 tablet (100 mg total) by mouth daily. Ozell Heron CHRISTELLA, MD  Active   sertraline  (ZOLOFT ) 100 MG tablet [id number] Yes Take 1 tablet (100 mg total) by mouth daily.   Active   tadalafil  (CIALIS ) 20 MG tablet [id number] Yes Take 0.5-1 tablets (10-20 mg total) by mouth every other day as needed for erectile dysfunction. Ozell Heron CHRISTELLA, MD  Active  tirzepatide  (MOUNJARO ) 12.5 MG/0.5ML Pen [id number] Yes Inject 12.5 mg into the skin once a week. Thapa, [place], MD  Active   tirzepatide  (MOUNJARO ) 15 MG/0.5ML Pen [id number] Yes Inject 15 mg into the skin once a week. Thapa, [place], MD  Active             Home Care and Equipment/Supplies: Were Home Health Services Ordered?: No Any new equipment or medical supplies ordered?: No  Functional Questionnaire: Do you need  assistance with bathing/showering or dressing?: No Do you need assistance with meal preparation?: No Do you have difficulty maintaining continence: No Do you need assistance with getting out of bed/getting out of a chair/moving?: No Do you have difficulty managing or taking your medications?: No  Follow up appointments reviewed: PCP Follow-up appointment confirmed?: No (patient didn't want to sch appt right now) MD Provider Line [phone] Given: Yes Follow-up Provider: [facility] Follow-up appointment confirmed?: No Reason Specialist Follow-Up Not Confirmed: Patient has Specialist Provider Number and will Call for Appointment Do you understand care options if your condition(s) worsen?: Yes-patient verbalized understanding    SIGNATURE:Khamora Karan A.Michele Kerlin CMA 09/29/2023

## 2023-10-07 DIAGNOSIS — E1165 Type 2 diabetes mellitus with hyperglycemia: Secondary | ICD-10-CM | POA: Diagnosis not present

## 2023-10-08 ENCOUNTER — Other Ambulatory Visit (HOSPITAL_BASED_OUTPATIENT_CLINIC_OR_DEPARTMENT_OTHER): Payer: Self-pay

## 2023-10-08 ENCOUNTER — Other Ambulatory Visit: Payer: Self-pay

## 2023-10-12 DIAGNOSIS — M9905 Segmental and somatic dysfunction of pelvic region: Secondary | ICD-10-CM | POA: Diagnosis not present

## 2023-10-12 DIAGNOSIS — M9901 Segmental and somatic dysfunction of cervical region: Secondary | ICD-10-CM | POA: Diagnosis not present

## 2023-10-12 DIAGNOSIS — M9903 Segmental and somatic dysfunction of lumbar region: Secondary | ICD-10-CM | POA: Diagnosis not present

## 2023-10-12 DIAGNOSIS — M9904 Segmental and somatic dysfunction of sacral region: Secondary | ICD-10-CM | POA: Diagnosis not present

## 2023-10-12 NOTE — Addendum Note (Signed)
 Addended by: TAWNI DRILLING D on: 10/12/2023 04:24 PM   Modules accepted: Orders

## 2023-10-12 NOTE — Progress Notes (Signed)
 Remote ICD transmission.

## 2023-10-15 NOTE — Progress Notes (Deleted)
 Cardiology Office Note   Date:  10/15/2023  ID:  Jaryan, [place]ine 1955-09-12, MRN [id number] PCP: Ozell Heron CHRISTELLA, MD  [facility] Providers Cardiologist:  Dorn Lesches, MD Electrophysiologist:  Danelle Birmingham, MD { Click to update primary MD,subspecialty MD or APP then REFRESH:1}    History of Present Illness Matthew Barnes is a 68 y.o. male who with a past medical history of bipolar disorder, diabetes mellitus, hypertension, CHF, CKD, and CAD who recently presented to the emergency department with chief complaint of dizziness.  He is here for follow-up appointment.  On 09/25/2023 he presented to the ED with dizziness.  He stated he had been lightheaded and dizzy upon standing for the past several months.  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  He is hemodynamically stable on arrival with no acute distress.  He appears to be euvolemic on exam.  Had no shocks from his ICD and no syncopal episodes.  Patient's EKG was stable and normal sinus rhythm without acute ischemic changes.  Patient had labs performed which showed mild increase in creatinine but otherwise normal range.  Orthostatics were positive with blood pressure dropping to the 70s upon standing.  He was given 500 cc of fluid with improvement.  It was recommended that he wear compression stockings upon standing and outpatient primary care and cardiology follow-up was arranged.  Echocardiogram from January 2023 reviewed with LVEF 2025%.  Referral to EP was made for ICD/defibrillator placement which was done shortly after.  Today, he***  ROS: Pertinent ROS in HPI  Studies Reviewed     April 26, 2021 echocardiogram IMPRESSIONS     1. Left ventricular ejection fraction, by estimation, is 20 to 25%. The  left ventricle has severely decreased function. The left ventricle  demonstrates global hypokinesis. The left ventricular  internal cavity size  was severely dilated. Left ventricular  diastolic parameters are consistent with Grade II diastolic dysfunction  (pseudonormalization). Elevated left ventricular end-diastolic pressure.   2. Right ventricular systolic function is normal. The right ventricular  size is normal. There is severely elevated pulmonary artery systolic  pressure.   3. Left atrial size was moderately dilated.   4. Right atrial size was mildly dilated.   5. The mitral valve is abnormal. Mild to moderate mitral valve  regurgitation. No evidence of mitral stenosis.   6. The aortic valve was not well visualized. There is mild calcification  of the aortic valve. Aortic valve regurgitation is mild. Aortic valve  sclerosis is present, with no evidence of aortic valve stenosis.   7. Seems to be a lot of turbulence in RVOT . Pulmonic valve regurgitation  is moderate.   8. The inferior vena cava is normal in size with greater than 50%  respiratory variability, suggesting right atrial pressure of 3 mmHg.   Comparison(s): 08/03/20 EF 20-25%.   FINDINGS   Left Ventricle: Left ventricular ejection fraction, by estimation, is 20  to 25%. The left ventricle has severely decreased function. The left  ventricle demonstrates global hypokinesis. Definity  contrast agent was  given IV to delineate the left  ventricular endocardial borders. The left ventricular internal cavity size  was severely dilated. There is no left ventricular hypertrophy. Left  ventricular diastolic parameters are consistent with Grade II diastolic  dysfunction (pseudonormalization).  Elevated left ventricular end-diastolic pressure.   Right Ventricle: The right ventricular size is normal. No  increase in  right ventricular wall thickness. Right ventricular systolic function is  normal. There is severely elevated pulmonary artery systolic pressure. The  tricuspid regurgitant velocity is  3.65 m/s, and with an assumed right atrial  pressure of 8 mmHg, the  estimated right ventricular systolic pressure is 61.3 mmHg.   Left Atrium: Left atrial size was moderately dilated.   Right Atrium: Right atrial size was mildly dilated.   Pericardium: There is no evidence of pericardial effusion.   Mitral Valve: The mitral valve is abnormal. There is mild thickening of  the mitral valve leaflet(s). Mild to moderate mitral valve regurgitation.  No evidence of mitral valve stenosis.   Tricuspid Valve: The tricuspid valve is normal in structure. Tricuspid  valve regurgitation is not demonstrated. No evidence of tricuspid  stenosis.   Aortic Valve: The aortic valve was not well visualized. There is mild  calcification of the aortic valve. Aortic valve regurgitation is mild.  Aortic valve sclerosis is present, with no evidence of aortic valve  stenosis.   Pulmonic Valve: Seems to be a lot of turbulence in RVOT. The pulmonic  valve was normal in structure. Pulmonic valve regurgitation is moderate.  No evidence of pulmonic stenosis.   Aorta: The aortic root is normal in size and structure.   Venous: The inferior vena cava is normal in size with greater than 50%  respiratory variability, suggesting right atrial pressure of 3 mmHg.   IAS/Shunts: No atrial level shunt detected by color flow Doppler.   Risk Assessment/Calculations {Does this patient have ATRIAL FIBRILLATION?[phone]} No BP recorded.  {Refresh Note OR Click here to enter BP  :1}***       Physical Exam VS:  There were no vitals taken for this visit.       Wt Readings from Last 3 Encounters:  09/25/23 250 lb (113.4 kg)  08/12/23 267 lb 12.8 oz (121.5 kg)  05/25/23 273 lb 1.6 oz (123.9 kg)    GEN: Well nourished, well developed in no acute distress NECK: No JVD; No carotid bruits CARDIAC: ***RRR, no murmurs, rubs, gallops RESPIRATORY:  Clear to auscultation without rales, wheezing or rhonchi  ABDOMEN: Soft, non-tender, non-distended EXTREMITIES:  No  edema; No deformity   ASSESSMENT AND PLAN Orthostatic hypotension/dehydration Dilated cardiomyopathy/ICD placement HTN     {Are you ordering a CV Procedure (e.g. stress test, cath, DCCV, TEE, etc)?   Press F2        :[id number]}  Dispo: ***  Signed, Orren LOISE Fabry, PA-C

## 2023-10-19 ENCOUNTER — Ambulatory Visit: Admitting: Physician Assistant

## 2023-10-19 ENCOUNTER — Other Ambulatory Visit (HOSPITAL_BASED_OUTPATIENT_CLINIC_OR_DEPARTMENT_OTHER): Payer: Self-pay

## 2023-10-19 DIAGNOSIS — I5042 Chronic combined systolic (congestive) and diastolic (congestive) heart failure: Secondary | ICD-10-CM

## 2023-10-19 DIAGNOSIS — I420 Dilated cardiomyopathy: Secondary | ICD-10-CM

## 2023-10-19 DIAGNOSIS — Z95810 Presence of automatic (implantable) cardiac defibrillator: Secondary | ICD-10-CM

## 2023-10-19 DIAGNOSIS — I10 Essential (primary) hypertension: Secondary | ICD-10-CM

## 2023-10-20 ENCOUNTER — Other Ambulatory Visit (HOSPITAL_BASED_OUTPATIENT_CLINIC_OR_DEPARTMENT_OTHER): Payer: Self-pay

## 2023-10-21 ENCOUNTER — Other Ambulatory Visit: Payer: Self-pay

## 2023-10-21 ENCOUNTER — Other Ambulatory Visit (HOSPITAL_BASED_OUTPATIENT_CLINIC_OR_DEPARTMENT_OTHER): Payer: Self-pay

## 2023-10-22 ENCOUNTER — Other Ambulatory Visit: Payer: Self-pay

## 2023-11-02 DIAGNOSIS — M9903 Segmental and somatic dysfunction of lumbar region: Secondary | ICD-10-CM | POA: Diagnosis not present

## 2023-11-02 DIAGNOSIS — M9904 Segmental and somatic dysfunction of sacral region: Secondary | ICD-10-CM | POA: Diagnosis not present

## 2023-11-02 DIAGNOSIS — M9905 Segmental and somatic dysfunction of pelvic region: Secondary | ICD-10-CM | POA: Diagnosis not present

## 2023-11-02 DIAGNOSIS — M9901 Segmental and somatic dysfunction of cervical region: Secondary | ICD-10-CM | POA: Diagnosis not present

## 2023-11-03 ENCOUNTER — Other Ambulatory Visit: Payer: Self-pay

## 2023-11-03 ENCOUNTER — Other Ambulatory Visit (HOSPITAL_BASED_OUTPATIENT_CLINIC_OR_DEPARTMENT_OTHER): Payer: Self-pay

## 2023-11-03 ENCOUNTER — Other Ambulatory Visit: Payer: Self-pay | Admitting: Family Medicine

## 2023-11-03 ENCOUNTER — Ambulatory Visit: Attending: Cardiovascular Disease | Admitting: Cardiovascular Disease

## 2023-11-03 ENCOUNTER — Encounter: Payer: Self-pay | Admitting: Cardiovascular Disease

## 2023-11-03 VITALS — BP 106/62 | HR 82 | Ht 73.0 in | Wt 260.0 lb

## 2023-11-03 DIAGNOSIS — I272 Pulmonary hypertension, unspecified: Secondary | ICD-10-CM | POA: Diagnosis not present

## 2023-11-03 DIAGNOSIS — I420 Dilated cardiomyopathy: Secondary | ICD-10-CM

## 2023-11-03 DIAGNOSIS — E782 Mixed hyperlipidemia: Secondary | ICD-10-CM | POA: Diagnosis not present

## 2023-11-03 DIAGNOSIS — I10 Essential (primary) hypertension: Secondary | ICD-10-CM | POA: Diagnosis not present

## 2023-11-03 MED ORDER — QUETIAPINE FUMARATE 200 MG PO TABS
600.0000 mg | ORAL_TABLET | Freq: Every day | ORAL | 1 refills | Status: AC
  Filled 2023-11-03: qty 270, 90d supply, fill #0
  Filled 2023-11-05: qty 90, 30d supply, fill #0
  Filled 2023-12-09: qty 90, 30d supply, fill #1
  Filled 2024-01-07: qty 90, 30d supply, fill #2
  Filled 2024-02-08: qty 90, 30d supply, fill #3

## 2023-11-03 MED ORDER — LOSARTAN POTASSIUM 25 MG PO TABS
25.0000 mg | ORAL_TABLET | Freq: Every day | ORAL | 1 refills | Status: DC
  Filled 2023-11-03 – 2023-11-05 (×2): qty 30, 30d supply, fill #0
  Filled 2023-12-09: qty 30, 30d supply, fill #1
  Filled 2024-01-07: qty 30, 30d supply, fill #2
  Filled 2024-02-08: qty 30, 30d supply, fill #3
  Filled 2024-03-10: qty 30, 30d supply, fill #4
  Filled 2024-04-11 – 2024-04-13 (×2): qty 30, 30d supply, fill #5

## 2023-11-03 NOTE — Patient Instructions (Signed)
 Medication Instructions:  Your physician has recommended you make the following change in your medication:   -Decrease losartan  (Cozaar ) to 25mg  once daily.   *If you need a refill on your cardiac medications before your next appointment, please call your pharmacy*   Testing/Procedures: Your physician has requested that you have an echocardiogram. Echocardiography is a painless test that uses sound waves to create images of your heart. It provides your doctor with information about the size and shape of your heart and how well your heart's chambers and valves are working. This procedure takes approximately one hour. There are no restrictions for this procedure. Please do NOT wear cologne, perfume, aftershave, or lotions (deodorant is allowed). Please arrive 15 minutes prior to your appointment time.  Please note: We ask at that you not bring children with you during ultrasound (echo/ vascular) testing. Due to room size and safety concerns, children are not allowed in the ultrasound rooms during exams. Our front office staff cannot provide observation of children in our lobby area while testing is being conducted. An adult accompanying a patient to their appointment will only be allowed in the ultrasound room at the discretion of the ultrasound technician under special circumstances. We apologize for any inconvenience.   Follow-Up: At [facility], you and your health needs are our priority.  As part of our continuing mission to provide you with exceptional heart care, our providers are all part of one team.  This team includes your primary Cardiologist (physician) and Advanced Practice Providers or APPs (Physician Assistants and Nurse Practitioners) who all work together to provide you with the care you need, when you need it.  Your next appointment:   6 month(s)  Provider:   Jon Hails, PA-C, Callie Goodrich, PA-C, Kathleen Johnson, PA-C, Hao Meng, PA-C, Damien Braver, NP, or Katlyn  West, NP         Then, Dorn Lesches, MD will plan to see you again in 12 month(s).

## 2023-11-03 NOTE — Assessment & Plan Note (Signed)
 History of essential hypertension with blood pressure measured today at 106/62.  He is on carvedilol  and losartan .  He does get some symptoms compatible with orthostasis with dizziness when he stands up.  I am going to decrease his losartan  from 50 to 25 mg a day.

## 2023-11-03 NOTE — Assessment & Plan Note (Signed)
 Cardiac cath done by Dr. Anner 08/07/2020 revealed normal coronary arteries with an EF in the 25% range.  He is on low-dose Coreg  and losartan .  He is stable and denies heart failure symptoms.

## 2023-11-03 NOTE — Assessment & Plan Note (Signed)
 History of hyperlipidemia on statin therapy with lipid profile performed 11/07/2022 revealing total cholesterol 96, LDL 38 and HDL 22.

## 2023-11-03 NOTE — Assessment & Plan Note (Signed)
 2D echo performed 04/26/2021 revealed EF of 20 to 25%, grade 2 diastolic dysfunction and RV systolic pressure of 61 mmHg.

## 2023-11-03 NOTE — Progress Notes (Signed)
 11/03/2023 Matthew Barnes   1956/01/28  [id number]  Primary Physician Ozell Heron CHRISTELLA, MD Primary Cardiologist: Dorn JINNY Lesches MD [place], [place]  HPI:  Matthew Barnes is a 68 y.o.  moderately overweight divorced Caucasian male with no children who works as an Biomedical scientist. He was referred by Dr. Marty Cramp for cardiovascular dilation because of chest pain and shortness of breath. His risk factors include treated hypertension, diabetes and hyperlipidemia as well as strong family history of heart disease with a mother and brother both of whom had stents. He is never had a heart attack or stroke.  Over the several months prior to seeing him in 2019 he was complaining of chest pain and shortness of breath.  I had ordered outpatient diagnostic studies which unfortunately were never performed  Since I saw him 6 years ago he did have a right left heart cath by Dr. Anner 08/07/2020 that revealed normal coronary arteries.  He ultimately had an ICD placed for primary prevention by Dr. Waddell 05/20/2021.  His last echo performed 04/26/2021 revealed EF of 20 to 25%, grade 2 diastolic dysfunction with moderate pulmonary hypertension and RV systolic pressure of 60 mmHg.  He has lost 40 to 50 pound since I saw him 6 years ago on GLP-1 inhibitor.  He also complains of some orthostasis symptoms but denies chest pain or shortness of breath.   Current Meds  Medication Sig   ACCU-CHEK GUIDE test strip USE TO check blood glucose twice daily AS DIRECTED   Accu-Chek Softclix Lancets lancets Check blood sugar 2 times daily   atorvastatin  (LIPITOR) 20 MG tablet Take 1 tablet (20 mg total) by mouth daily at bedtime   bacitracin  500 UNIT/GM ointment Apply 1 Application topically 2 (two) times daily.   Blood Glucose Monitoring Suppl (ACCU-CHEK GUIDE) w/Device KIT Check blood sugar 2 times daily   carvedilol  (COREG ) 6.25 MG tablet Take 1 tablet (6.25 mg total) by mouth 2 (two) times daily at breakfast and at  bedtime.   digoxin  (LANOXIN ) 0.125 MG tablet Take 1 tablet (0.125 mg total) by mouth at bedtime.   furosemide  (LASIX ) 20 MG tablet Take 1 tablet (20 mg total) by mouth every evening.   glucose blood (ACCU-CHEK GUIDE) test strip Use to check blood sugar 2 times daily   losartan  (COZAAR ) 50 MG tablet Take 1 tablet (50 mg total) by mouth daily.   meclizine  (ANTIVERT ) 25 MG tablet Take 0.5-1 tablets (12.5-25 mg total) by mouth 3 (three) times daily as needed for dizziness.   omeprazole  (PRILOSEC) 20 MG capsule Take 1 capsule (20 mg total) by mouth daily.   QUEtiapine  (SEROQUEL ) 200 MG tablet Take 2-3 tablets by mouth at bedtime as directed   sertraline  (ZOLOFT ) 100 MG tablet Take 1 tablet (100 mg total) by mouth daily.   tadalafil  (CIALIS ) 20 MG tablet Take 0.5-1 tablets (10-20 mg total) by mouth every other day as needed for erectile dysfunction.   tirzepatide  (MOUNJARO ) 15 MG/0.5ML Pen Inject 15 mg into the skin once a week.     Allergies  Allergen Reactions   Influenza Vaccines Swelling and Other (See Comments)    Reports of swelling of the arm and then was sick for 10 days    Social History   Socioeconomic History   Marital status: Single    Spouse name: Not on file   Number of children: Not on file   Years of education: Not on file   Highest education  level: GED or equivalent  Occupational History   Not on file  Tobacco Use   Smoking status: Never   Smokeless tobacco: Never  Vaping Use   Vaping status: Never Used  Substance and Sexual Activity   Alcohol use: No   Drug use: No   Sexual activity: Not Currently  Other Topics Concern   Not on file  Social History Narrative   Work or School: on disability for knee arthritis      Home Situation: lives alone      Spiritual Beliefs:Christian      Lifestyle:no regular exercising; diet not great            Social Drivers of Health   Financial Resource Strain: Low Risk  (01/02/2023)   Overall Financial Resource Strain  (CARDIA)    Difficulty of Paying Living Expenses: Not hard at all  Food Insecurity: No Food Insecurity (01/02/2023)   Hunger Vital Sign    Worried About Running Out of Food in the Last Year: Never true    Ran Out of Food in the Last Year: Never true  Transportation Needs: No Transportation Needs (01/02/2023)   PRAPARE - Administrator, Civil Service (Medical): No    Lack of Transportation (Non-Medical): No  Physical Activity: Sufficiently Active (01/02/2023)   Exercise Vital Sign    Days of Exercise per Week: 5 days    Minutes of Exercise per Session: 30 min  Stress: No Stress Concern Present (01/02/2023)   [employer] of Occupational Health - Occupational Stress Questionnaire    Feeling of Stress : Not at all  Social Connections: Socially Isolated (01/02/2023)   Social Connection and Isolation Panel    Frequency of Communication with Friends and Family: Twice a week    Frequency of Social Gatherings with Friends and Family: Once a week    Attends Religious Services: Never    Database administrator or Organizations: No    Attends Engineer, structural: Not on file    Marital Status: Divorced  Intimate Partner Violence: Not At Risk (03/10/2022)   Humiliation, Afraid, Rape, and Kick questionnaire    Fear of Current or Ex-Partner: No    Emotionally Abused: No    Physically Abused: No    Sexually Abused: No     Review of Systems: General: negative for chills, fever, night sweats or weight changes.  Cardiovascular: negative for chest pain, dyspnea on exertion, edema, orthopnea, palpitations, paroxysmal nocturnal dyspnea or shortness of breath Dermatological: negative for rash Respiratory: negative for cough or wheezing Urologic: negative for hematuria Abdominal: negative for nausea, vomiting, diarrhea, bright red blood per rectum, melena, or hematemesis Neurologic: negative for visual changes, syncope, or dizziness All other systems reviewed and are otherwise  negative except as noted above.    Blood pressure 106/62, pulse 82, height 6' 1 (1.854 m), weight 260 lb (117.9 kg), SpO2 96%.  General appearance: alert and no distress Neck: no adenopathy, no carotid bruit, no JVD, supple, symmetrical, trachea midline, and thyroid  not enlarged, symmetric, no tenderness/mass/nodules Lungs: clear to auscultation bilaterally Heart: regular rate and rhythm, S1, S2 normal, no murmur, click, rub or gallop Extremities: extremities normal, atraumatic, no cyanosis or edema Pulses: 2+ and symmetric Skin: Skin color, texture, turgor normal. No rashes or lesions Neurologic: Grossly normal  EKG not performed today      ASSESSMENT AND PLAN:   Primary hypertension History of essential hypertension with blood pressure measured today at 106/62.  He is on  carvedilol  and losartan .  He does get some symptoms compatible with orthostasis with dizziness when he stands up.  I am going to decrease his losartan  from 50 to 25 mg a day.  Hyperlipidemia History of hyperlipidemia on statin therapy with lipid profile performed 11/07/2022 revealing total cholesterol 96, LDL 38 and HDL 22.  Dilated cardiomyopathy [facility]) Cardiac cath done by Dr. Anner 08/07/2020 revealed normal coronary arteries with an EF in the 25% range.  He is on low-dose Coreg  and losartan .  He is stable and denies heart failure symptoms.  Pulmonary hypertension, unspecified (HCC) 2D echo performed 04/26/2021 revealed EF of 20 to 25%, grade 2 diastolic dysfunction and RV systolic pressure of 61 mmHg.     Dorn DOROTHA Lesches MD FACP,FACC,FAHA, [facility] 11/03/2023 10:13 AM

## 2023-11-04 ENCOUNTER — Other Ambulatory Visit: Payer: Self-pay

## 2023-11-04 ENCOUNTER — Other Ambulatory Visit (HOSPITAL_BASED_OUTPATIENT_CLINIC_OR_DEPARTMENT_OTHER): Payer: Self-pay

## 2023-11-04 DIAGNOSIS — M9903 Segmental and somatic dysfunction of lumbar region: Secondary | ICD-10-CM | POA: Diagnosis not present

## 2023-11-04 DIAGNOSIS — M9905 Segmental and somatic dysfunction of pelvic region: Secondary | ICD-10-CM | POA: Diagnosis not present

## 2023-11-04 DIAGNOSIS — M9904 Segmental and somatic dysfunction of sacral region: Secondary | ICD-10-CM | POA: Diagnosis not present

## 2023-11-04 DIAGNOSIS — M9901 Segmental and somatic dysfunction of cervical region: Secondary | ICD-10-CM | POA: Diagnosis not present

## 2023-11-04 MED ORDER — QUETIAPINE FUMARATE 200 MG PO TABS
600.0000 mg | ORAL_TABLET | Freq: Every day | ORAL | 1 refills | Status: AC
  Filled 2023-11-04 – 2024-04-22 (×2): qty 270, 90d supply, fill #0

## 2023-11-05 ENCOUNTER — Other Ambulatory Visit: Payer: Self-pay

## 2023-11-06 ENCOUNTER — Other Ambulatory Visit (HOSPITAL_COMMUNITY): Payer: Self-pay

## 2023-11-10 DIAGNOSIS — M9905 Segmental and somatic dysfunction of pelvic region: Secondary | ICD-10-CM | POA: Diagnosis not present

## 2023-11-10 DIAGNOSIS — M9901 Segmental and somatic dysfunction of cervical region: Secondary | ICD-10-CM | POA: Diagnosis not present

## 2023-11-10 DIAGNOSIS — M9903 Segmental and somatic dysfunction of lumbar region: Secondary | ICD-10-CM | POA: Diagnosis not present

## 2023-11-10 DIAGNOSIS — M9904 Segmental and somatic dysfunction of sacral region: Secondary | ICD-10-CM | POA: Diagnosis not present

## 2023-11-12 ENCOUNTER — Other Ambulatory Visit: Payer: Self-pay

## 2023-11-20 ENCOUNTER — Emergency Department (HOSPITAL_BASED_OUTPATIENT_CLINIC_OR_DEPARTMENT_OTHER)
Admission: EM | Admit: 2023-11-20 | Discharge: 2023-11-20 | Disposition: A | Discharge status: Home / Self Care | Attending: Emergency Medicine | Admitting: Emergency Medicine

## 2023-11-20 DIAGNOSIS — E1142 Type 2 diabetes mellitus with diabetic polyneuropathy: Secondary | ICD-10-CM | POA: Diagnosis not present

## 2023-11-20 DIAGNOSIS — R42 Dizziness and giddiness: Secondary | ICD-10-CM

## 2023-11-20 DIAGNOSIS — N1832 Chronic kidney disease, stage 3b: Secondary | ICD-10-CM | POA: Insufficient documentation

## 2023-11-20 DIAGNOSIS — Z955 Presence of coronary angioplasty implant and graft: Secondary | ICD-10-CM | POA: Diagnosis not present

## 2023-11-20 DIAGNOSIS — Z95810 Presence of automatic (implantable) cardiac defibrillator: Secondary | ICD-10-CM | POA: Insufficient documentation

## 2023-11-20 DIAGNOSIS — I130 Hypertensive heart and chronic kidney disease with heart failure and stage 1 through stage 4 chronic kidney disease, or unspecified chronic kidney disease: Secondary | ICD-10-CM | POA: Insufficient documentation

## 2023-11-20 DIAGNOSIS — I5022 Chronic systolic (congestive) heart failure: Secondary | ICD-10-CM | POA: Diagnosis not present

## 2023-11-20 DIAGNOSIS — Z79899 Other long term (current) drug therapy: Secondary | ICD-10-CM | POA: Diagnosis not present

## 2023-11-20 DIAGNOSIS — E039 Hypothyroidism, unspecified: Secondary | ICD-10-CM | POA: Insufficient documentation

## 2023-11-20 DIAGNOSIS — I951 Orthostatic hypotension: Secondary | ICD-10-CM | POA: Insufficient documentation

## 2023-11-20 DIAGNOSIS — Z794 Long term (current) use of insulin: Secondary | ICD-10-CM | POA: Diagnosis not present

## 2023-11-20 LAB — CBC
HCT: 41.9 % (ref 39.0–52.0)
Hemoglobin: 13.9 g/dL (ref 13.0–17.0)
MCH: 26.8 pg (ref 26.0–34.0)
MCHC: 33.2 g/dL (ref 30.0–36.0)
MCV: 80.9 fL (ref 80.0–100.0)
Platelets: 143 K/uL — ABNORMAL LOW (ref 150–400)
RBC: 5.18 MIL/uL (ref 4.22–5.81)
RDW: 12.9 % (ref 11.5–15.5)
WBC: 6.6 K/uL (ref 4.0–10.5)
nRBC: 0 % (ref 0.0–0.2)

## 2023-11-20 LAB — URINALYSIS, ROUTINE W REFLEX MICROSCOPIC
Bilirubin Urine: NEGATIVE
Glucose, UA: NEGATIVE mg/dL
Hgb urine dipstick: NEGATIVE
Ketones, ur: NEGATIVE mg/dL
Leukocytes,Ua: NEGATIVE
Nitrite: NEGATIVE
Protein, ur: NEGATIVE mg/dL
Specific Gravity, Urine: 1.021 (ref 1.005–1.030)
pH: 5.5 (ref 5.0–8.0)

## 2023-11-20 LAB — COMPREHENSIVE METABOLIC PANEL WITH GFR
ALT: 21 U/L (ref 0–44)
AST: 30 U/L (ref 15–41)
Albumin: 4.4 g/dL (ref 3.5–5.0)
Alkaline Phosphatase: 74 U/L (ref 38–126)
Anion gap: 13 (ref 5–15)
BUN: 17 mg/dL (ref 8–23)
CO2: 26 mmol/L (ref 22–32)
Calcium: 10.3 mg/dL (ref 8.9–10.3)
Chloride: 101 mmol/L (ref 98–111)
Creatinine, Ser: 1.51 mg/dL — ABNORMAL HIGH (ref 0.61–1.24)
GFR, Estimated: 50 mL/min — ABNORMAL LOW (ref >60)
Glucose, Bld: 106 mg/dL — ABNORMAL HIGH (ref 70–99)
Potassium: 3.9 mmol/L (ref 3.5–5.1)
Sodium: 140 mmol/L (ref 135–145)
Total Bilirubin: 0.5 mg/dL (ref 0.0–1.2)
Total Protein: 7.3 g/dL (ref 6.5–8.1)

## 2023-11-20 LAB — CBG MONITORING, ED: Glucose-Capillary: 104 mg/dL — ABNORMAL HIGH (ref 70–99)

## 2023-11-20 MED ORDER — SODIUM CHLORIDE 0.9 % IV BOLUS
500.0000 mL | Freq: Once | INTRAVENOUS | Status: AC
  Administered 2023-11-20: 500 mL via INTRAVENOUS

## 2023-11-20 MED ORDER — SODIUM CHLORIDE 0.9 % IV BOLUS: 1000.0000 mL | Freq: Once | INTRAVENOUS | Status: DC

## 2023-11-20 NOTE — Discharge Instructions (Addendum)
 While you were in the emergency room, he had blood work done that was normal.  Your EKG did not show any changes.  Like we discussed, is important that you follow-up with your cardiologist at your neck scheduled appointment.  Continue taking all medications as prescribed

## 2023-11-20 NOTE — ED Provider Notes (Signed)
 [place] EMERGENCY DEPARTMENT AT [facility] Provider Note  CSN: [id number] Arrival date & time: 11/20/23 1749  Chief Complaint(s) Dizziness  HPI Matthew Barnes is a 68 y.o. male who is here today for intermittent episodes of lightheadedness that he says occurs when he stands up.  Patient says this has been ongoing for the last several months.  He feels fine when he is at rest, or once he gets going, but in the period from when he goes from a seated to standing position, he begins to feel lightheaded.   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   Morbid obesity (HCC) 04/12/2021   Pulmonary hypertension, unspecified (HCC) 04/12/2021   Chronic kidney disease, stage 3b (HCC) 04/12/2021   Dilated cardiomyopathy (HCC)    Elevated troponin level not due to acute coronary syndrome    Chronic systolic CHF (congestive heart failure) (HCC) 08/06/2020   AMS (altered mental status) 07/31/2020   Aphasia 07/31/2020   Difficulty with speech 07/30/2020   Chest pain 03/26/2018   Dyspnea on exertion 03/26/2018   Family history of heart disease 03/26/2018   Leg edema 10/27/2016   Bipolar affective, mixed (HCC) 04/29/2016   Esophageal reflux 01/17/2014   Type 2 diabetes mellitus with diabetic neuropathy, with long-term current use of insulin  (HCC) 08/31/2013   Primary hypertension 05/03/2013   Hyperlipidemia 05/03/2013   Hypothyroidism 05/03/2013   Home Medication(s) Prior to Admission medications   Medication Sig Start Date End Date Taking? Authorizing Provider  ACCU-CHEK GUIDE test strip USE TO check blood glucose twice daily AS DIRECTED  11/17/22   [place], MD  Accu-Chek Softclix Lancets lancets Check blood sugar 2 times daily 10/08/22   [place], MD  atorvastatin  (LIPITOR) 20 MG tablet Take 1 tablet (20 mg total) by mouth daily at bedtime 05/25/23   Ozell Heron CHRISTELLA, MD  bacitracin  500 UNIT/GM ointment Apply 1 Application topically 2 (two) times daily. 12/10/22   Silver Wonda LABOR, PA  Blood Glucose Monitoring Suppl (ACCU-CHEK GUIDE) w/Device KIT Check blood sugar 2 times daily 10/14/22   [place], MD  carvedilol  (COREG ) 6.25 MG tablet Take 1 tablet (6.25 mg total) by mouth 2 (two) times daily at breakfast and at bedtime. 06/04/23   Ozell Heron CHRISTELLA, MD  digoxin  (LANOXIN ) 0.125 MG tablet Take 1 tablet (0.125 mg total) by mouth at bedtime. 08/17/23   Ozell Heron CHRISTELLA, MD  furosemide  (LASIX ) 20 MG tablet Take 1 tablet (20 mg total) by mouth every evening. 05/25/23   Ozell Heron CHRISTELLA, MD  glucose blood (ACCU-CHEK GUIDE) test strip Use to check blood sugar 2 times daily 12/16/21     losartan  (COZAAR ) 25 MG tablet Take 1 tablet (25 mg total) by mouth daily. 11/03/23   Court Dorn PARAS, MD  meclizine  (ANTIVERT ) 25 MG tablet Take 0.5-1 tablets (12.5-25 mg total) by mouth 3 (three) times daily as needed for dizziness. 06/10/23   Ozell Heron CHRISTELLA, MD  omeprazole  (PRILOSEC) 20 MG capsule Take 1 capsule (20 mg total) by mouth daily. 05/25/23   Ozell Heron CHRISTELLA, MD  QUEtiapine  (SEROQUEL ) 200 MG tablet Take 2-3 tablets by mouth at bedtime as directed  04/23/22     QUEtiapine  (SEROQUEL ) 200 MG tablet Take 3 tablets (600 mg total) by mouth at bedtime. 11/03/23     QUEtiapine  (SEROQUEL ) 200 MG tablet Take 3 tablets (600 mg total) by mouth at bedtime. 11/04/23     sertraline  (ZOLOFT ) 100 MG tablet Take 1 tablet (100 mg total) by mouth daily. 03/18/23   Ozell Heron HERO, MD  tadalafil  (CIALIS ) 20 MG tablet Take 0.5-1 tablets (10-20 mg total) by mouth every other day as needed for erectile dysfunction. 01/05/23   Ozell Heron HERO, MD   tirzepatide  (MOUNJARO ) 15 MG/0.5ML Pen Inject 15 mg into the skin once a week. 05/14/23   [place], [place], MD                                                                                                                                    Past Surgical History Past Surgical History:  Procedure Laterality Date   ICD IMPLANT N/A 05/20/2021   Procedure: ICD IMPLANT;  Surgeon: Waddell Danelle ORN, MD;  Location: [facility] INVASIVE CV LAB;  Service: Cardiovascular;  Laterality: N/A;   KNEE SURGERY     scope on left knee   RIGHT/LEFT HEART CATH AND CORONARY ANGIOGRAPHY N/A 08/07/2020   Procedure: RIGHT/LEFT HEART CATH AND CORONARY ANGIOGRAPHY;  Surgeon: Anner Alm ORN, MD;  Location: [facility] INVASIVE CV LAB;  Service: Cardiovascular;  Laterality: N/A;   Family History Family History  Problem Relation Age of Onset   Diabetes Mother    Hypertension Mother    Heart disease Mother    Heart disease Father    Heart attack Father 39   Diabetes Father    Hypertension Father    Depression Father    Diabetes Sister    Depression Paternal Grandmother    Depression Other     Social History Social History   Tobacco Use   Smoking status: Never   Smokeless tobacco: Never  Vaping Use   Vaping status: Never Used  Substance Use Topics   Alcohol use: No   Drug use: No   Allergies Influenza vaccines  Review of Systems Review of Systems  Physical Exam Vital Signs  I have reviewed the triage vital signs BP (!) 149/86   Pulse 66   Temp 98.4 F (36.9 C)   Resp 19   SpO2 100%   Physical Exam Vitals and nursing note reviewed.  Constitutional:      Appearance: Normal appearance.  Eyes:     Pupils: Pupils are equal, round, and reactive to light.  Cardiovascular:     Rate and Rhythm: Normal rate.     Pulses: Normal pulses.  Abdominal:     General: Abdomen is flat.  Musculoskeletal:        General: No swelling or deformity. Normal range of motion.     Cervical back: Normal range of motion.   Neurological:     Mental Status: He is alert.  ED Results and Treatments Labs (all labs ordered are listed, but only abnormal results are displayed) Labs Reviewed  COMPREHENSIVE METABOLIC PANEL WITH GFR - Abnormal; Notable for the following components:      Result Value   Glucose, Bld 106 (*)    Creatinine, Ser 1.51 (*)    GFR, Estimated 50 (*)    All other components within normal limits  CBC - Abnormal; Notable for the following components:   Platelets 143 (*)    All other components within normal limits  CBG MONITORING, ED - Abnormal; Notable for the following components:   Glucose-Capillary 104 (*)    All other components within normal limits  URINALYSIS, ROUTINE W REFLEX MICROSCOPIC                                                                                                                          Radiology No results found.  Pertinent labs & imaging results that were available during my care of the patient were reviewed by me and considered in my medical decision making (see MDM for details).  Medications Ordered in ED Medications  sodium chloride  0.9 % bolus 500 mL (0 mLs Intravenous Stopped 11/20/23 2218)                                                                                                                                     Procedures Procedures  (including critical care time)  Medical Decision Making / ED Course   This patient presents to the ED for concern of dizziness when standing, this involves an extensive number of treatment options, and is a complaint that carries with it a high risk of complications and morbidity.  The differential diagnosis includes orthostatic hypotension, less likely Central dizziness, less likely vertigo.  MDM: Patient does have a history of heart failure.  Has an ICD, on digoxin .  He overall has a reassuring physical exam.  He only feels lightheaded when he goes to stand up.  This is consistent with orthostatic  hypotension.  Nothing in his story to indicate that he has been having any issues with his ICD, no reports of firing.  He has close follow-up with cardiology next week.  Will provide him with some light IV fluids here in the ED.  Will check basic labs on the patient.  Reassessment 11:15 PM-patient blood work overall normal.  Urine negative.  Feeling  quite a bit better, sat up without any difficulty.  He is appropriate for discharge.  11 follow-up with his PCP.  Additional history obtained: -Additional history obtained from  -External records from outside source obtained and reviewed including: Chart review including previous notes, labs, imaging, consultation notes   Lab Tests: -I ordered, reviewed, and interpreted labs.   The pertinent results include:   Labs Reviewed  COMPREHENSIVE METABOLIC PANEL WITH GFR - Abnormal; Notable for the following components:      Result Value   Glucose, Bld 106 (*)    Creatinine, Ser 1.51 (*)    GFR, Estimated 50 (*)    All other components within normal limits  CBC - Abnormal; Notable for the following components:   Platelets 143 (*)    All other components within normal limits  CBG MONITORING, ED - Abnormal; Notable for the following components:   Glucose-Capillary 104 (*)    All other components within normal limits  URINALYSIS, ROUTINE W REFLEX MICROSCOPIC      EKG no significant changes from June of this year.    EKG Interpretation Date/Time:  Friday November 20 2023 18:13:49 EDT Ventricular Rate:  87 PR Interval:  178 QRS Duration:  92 QT Interval:  344 QTC Calculation: 414 R Axis:   25  Text Interpretation: Sinus rhythm Low voltage, precordial leads Consider anterior infarct Borderline repolarization abnormality Baseline wander in lead(s) V2 No changes from 25 September 2023 Confirmed by Mannie Pac [phone]) on 11/20/2023 11:18:25 PM         Medicines ordered and prescription drug management: Meds ordered this encounter  Medications    DISCONTD: sodium chloride  0.9 % bolus 1,000 mL   sodium chloride  0.9 % bolus 500 mL    -I have reviewed the patients home medicines and have made adjustments as needed   Cardiac Monitoring: The patient was maintained on a cardiac monitor.  I personally viewed and interpreted the cardiac monitored which showed an underlying rhythm of: Normal sinus rhythm  Social Determinants of Health:  Factors impacting patients care include: Access to primary care   Reevaluation: After the interventions noted above, I reevaluated the patient and found that they have :improved  Co morbidities that complicate the patient evaluation  Past Medical History:  Diagnosis Date   Bipolar II disorder - Managed by Daniellle Adegoroye [phone]) 05/03/2013   Diabetes mellitus    Dysphagia    Hyperlipidemia    Hypertension    Unspecified hypothyroidism 05/03/2013      Dispostion: I considered admission for this patient, however with his reassuring workup he is appropriate for discharge.  Will have him follow-up with his cardiologist     Final Clinical Impression(s) / ED Diagnoses Final diagnoses:  Orthostatic dizziness     @PCDICTATION @    Mannie Pac T, DO 11/20/23 2320

## 2023-11-20 NOTE — ED Triage Notes (Signed)
 Pt caox4 c/o dizziness that has been ongoing, previously seen in ED d/t dizziness and orthostatic hypotension. Scheduled to have echo done with cards next week.

## 2023-11-24 ENCOUNTER — Ambulatory Visit (INDEPENDENT_AMBULATORY_CARE_PROVIDER_SITE_OTHER): Payer: Medicare HMO

## 2023-11-24 DIAGNOSIS — I420 Dilated cardiomyopathy: Secondary | ICD-10-CM | POA: Diagnosis not present

## 2023-11-25 ENCOUNTER — Ambulatory Visit: Payer: Self-pay | Admitting: Internal Medicine

## 2023-11-25 LAB — CUP PACEART REMOTE DEVICE CHECK
Battery Remaining Longevity: 118 mo
Battery Voltage: 3.01 V
Brady Statistic RV Percent Paced: 0.01 %
Date Time Interrogation Session: 20250826001805
HighPow Impedance: 81 Ohm
Implantable Lead Connection Status: 753985
Implantable Lead Implant Date: 20230220
Implantable Lead Location: 753860
Implantable Lead Model: 6935
Implantable Pulse Generator Implant Date: 20230220
Lead Channel Impedance Value: 551 Ohm
Lead Channel Impedance Value: 646 Ohm
Lead Channel Pacing Threshold Amplitude: 0.5 V
Lead Channel Pacing Threshold Pulse Width: 0.4 ms
Lead Channel Sensing Intrinsic Amplitude: 8 mV
Lead Channel Sensing Intrinsic Amplitude: 8 mV
Lead Channel Setting Pacing Amplitude: 2 V
Lead Channel Setting Pacing Pulse Width: 0.4 ms
Lead Channel Setting Sensing Sensitivity: 0.45 mV
Zone Setting Status: 755011
Zone Setting Status: 755011

## 2023-12-03 ENCOUNTER — Ambulatory Visit (INDEPENDENT_AMBULATORY_CARE_PROVIDER_SITE_OTHER)

## 2023-12-03 DIAGNOSIS — E782 Mixed hyperlipidemia: Secondary | ICD-10-CM

## 2023-12-03 DIAGNOSIS — I420 Dilated cardiomyopathy: Secondary | ICD-10-CM | POA: Diagnosis not present

## 2023-12-03 DIAGNOSIS — I10 Essential (primary) hypertension: Secondary | ICD-10-CM

## 2023-12-03 DIAGNOSIS — I272 Pulmonary hypertension, unspecified: Secondary | ICD-10-CM

## 2023-12-06 LAB — ECHOCARDIOGRAM COMPLETE
Area-P 1/2: 3.85 cm2
S' Lateral: 4.36 cm

## 2023-12-07 ENCOUNTER — Other Ambulatory Visit (HOSPITAL_BASED_OUTPATIENT_CLINIC_OR_DEPARTMENT_OTHER): Payer: Self-pay

## 2023-12-07 ENCOUNTER — Other Ambulatory Visit: Payer: Self-pay

## 2023-12-07 ENCOUNTER — Ambulatory Visit: Payer: Self-pay | Admitting: Cardiovascular Disease

## 2023-12-07 ENCOUNTER — Other Ambulatory Visit: Payer: Self-pay | Admitting: Family Medicine

## 2023-12-07 DIAGNOSIS — K219 Gastro-esophageal reflux disease without esophagitis: Secondary | ICD-10-CM

## 2023-12-07 DIAGNOSIS — E7800 Pure hypercholesterolemia, unspecified: Secondary | ICD-10-CM

## 2023-12-07 DIAGNOSIS — I5022 Chronic systolic (congestive) heart failure: Secondary | ICD-10-CM

## 2023-12-07 MED ORDER — ATORVASTATIN CALCIUM 20 MG PO TABS
20.0000 mg | ORAL_TABLET | Freq: Every day | ORAL | 0 refills | Status: DC
  Filled 2023-12-07: qty 30, 30d supply, fill #0
  Filled 2024-01-07: qty 30, 30d supply, fill #1
  Filled 2024-02-08: qty 30, 30d supply, fill #2

## 2023-12-07 MED ORDER — FUROSEMIDE 20 MG PO TABS
20.0000 mg | ORAL_TABLET | Freq: Every evening | ORAL | 0 refills | Status: DC
  Filled 2023-12-07: qty 30, 30d supply, fill #0
  Filled 2024-01-07: qty 30, 30d supply, fill #1
  Filled 2024-02-08: qty 30, 30d supply, fill #2

## 2023-12-07 MED ORDER — OMEPRAZOLE 20 MG PO CPDR
20.0000 mg | DELAYED_RELEASE_CAPSULE | Freq: Every day | ORAL | 0 refills | Status: DC
  Filled 2023-12-07: qty 30, 30d supply, fill #0
  Filled 2024-01-07: qty 30, 30d supply, fill #1
  Filled 2024-02-08: qty 30, 30d supply, fill #2

## 2023-12-07 MED ORDER — DIGOXIN 125 MCG PO TABS
0.1250 mg | ORAL_TABLET | Freq: Every day | ORAL | 0 refills | Status: DC
  Filled 2023-12-07: qty 30, 30d supply, fill #0
  Filled 2024-01-07: qty 30, 30d supply, fill #1
  Filled 2024-02-08: qty 30, 30d supply, fill #2

## 2023-12-07 MED ORDER — CARVEDILOL 6.25 MG PO TABS
6.2500 mg | ORAL_TABLET | Freq: Two times a day (BID) | ORAL | 0 refills | Status: DC
  Filled 2023-12-07: qty 60, 30d supply, fill #0
  Filled 2024-01-07: qty 60, 30d supply, fill #1
  Filled 2024-02-08: qty 60, 30d supply, fill #2

## 2023-12-08 ENCOUNTER — Other Ambulatory Visit (HOSPITAL_BASED_OUTPATIENT_CLINIC_OR_DEPARTMENT_OTHER): Payer: Self-pay

## 2023-12-09 ENCOUNTER — Other Ambulatory Visit (HOSPITAL_BASED_OUTPATIENT_CLINIC_OR_DEPARTMENT_OTHER): Payer: Self-pay

## 2023-12-09 ENCOUNTER — Other Ambulatory Visit: Payer: Self-pay

## 2023-12-11 ENCOUNTER — Other Ambulatory Visit: Payer: Self-pay

## 2023-12-15 NOTE — Progress Notes (Signed)
Remote ICD Transmission.

## 2023-12-31 ENCOUNTER — Other Ambulatory Visit (HOSPITAL_BASED_OUTPATIENT_CLINIC_OR_DEPARTMENT_OTHER): Payer: Self-pay

## 2024-01-01 ENCOUNTER — Other Ambulatory Visit: Payer: Self-pay

## 2024-01-01 ENCOUNTER — Other Ambulatory Visit (HOSPITAL_BASED_OUTPATIENT_CLINIC_OR_DEPARTMENT_OTHER): Payer: Self-pay

## 2024-01-06 DIAGNOSIS — E1165 Type 2 diabetes mellitus with hyperglycemia: Secondary | ICD-10-CM | POA: Diagnosis not present

## 2024-01-07 ENCOUNTER — Other Ambulatory Visit: Payer: Self-pay

## 2024-01-08 ENCOUNTER — Other Ambulatory Visit: Payer: Self-pay

## 2024-01-11 ENCOUNTER — Other Ambulatory Visit: Payer: Self-pay

## 2024-01-19 ENCOUNTER — Other Ambulatory Visit (HOSPITAL_BASED_OUTPATIENT_CLINIC_OR_DEPARTMENT_OTHER): Payer: Self-pay

## 2024-01-21 ENCOUNTER — Other Ambulatory Visit (HOSPITAL_BASED_OUTPATIENT_CLINIC_OR_DEPARTMENT_OTHER): Payer: Self-pay

## 2024-01-21 MED ORDER — SERTRALINE HCL 100 MG PO TABS
100.0000 mg | ORAL_TABLET | Freq: Every day | ORAL | 1 refills | Status: DC
  Filled 2024-01-21: qty 90, 90d supply, fill #0

## 2024-01-21 MED ORDER — QUETIAPINE FUMARATE 200 MG PO TABS
600.0000 mg | ORAL_TABLET | Freq: Every day | ORAL | 1 refills | Status: AC
  Filled 2024-01-21: qty 270, 90d supply, fill #0
  Filled 2024-03-10: qty 90, 30d supply, fill #0
  Filled 2024-04-11 – 2024-04-13 (×2): qty 90, 30d supply, fill #1
  Filled 2024-04-22: qty 90, 30d supply, fill #2

## 2024-02-08 ENCOUNTER — Other Ambulatory Visit (HOSPITAL_BASED_OUTPATIENT_CLINIC_OR_DEPARTMENT_OTHER): Payer: Self-pay

## 2024-02-08 ENCOUNTER — Other Ambulatory Visit: Payer: Self-pay

## 2024-02-09 ENCOUNTER — Other Ambulatory Visit: Payer: Self-pay

## 2024-02-09 ENCOUNTER — Ambulatory Visit: Payer: Self-pay | Admitting: Endocrinology

## 2024-02-09 ENCOUNTER — Ambulatory Visit: Admitting: Endocrinology

## 2024-02-09 ENCOUNTER — Encounter: Payer: Self-pay | Admitting: Endocrinology

## 2024-02-09 ENCOUNTER — Encounter: Payer: Self-pay | Admitting: Pharmacist

## 2024-02-09 ENCOUNTER — Other Ambulatory Visit (HOSPITAL_BASED_OUTPATIENT_CLINIC_OR_DEPARTMENT_OTHER): Payer: Self-pay

## 2024-02-09 DIAGNOSIS — Z794 Long term (current) use of insulin: Secondary | ICD-10-CM

## 2024-02-09 DIAGNOSIS — E119 Type 2 diabetes mellitus without complications: Secondary | ICD-10-CM

## 2024-02-09 LAB — HEMOGLOBIN A1C: Hemoglobin A1C: 5.7 % — AB (ref 4.0–5.6)

## 2024-02-09 MED ORDER — TIRZEPATIDE 15 MG/0.5ML ~~LOC~~ SOAJ
15.0000 mg | SUBCUTANEOUS | 4 refills | Status: AC
  Filled 2024-02-09 – 2024-04-22 (×4): qty 6, 84d supply, fill #0

## 2024-02-09 NOTE — Progress Notes (Signed)
 Outpatient Endocrinology Note Demaree Liberto, MD  02/09/24  Patient's Name: Matthew Barnes    DOB: 01/05/1956    MRN: [id number]                                                    REASON OF VISIT: Follow up of type 2 diabetes mellitus  PCP: Ozell Heron CHRISTELLA, MD  HISTORY OF PRESENT ILLNESS:   Matthew Barnes is a 68 y.o. old male with past medical history listed below, is here for follow up  type 2 diabetes mellitus.   Pertinent Diabetes History: Matthew Barnes was diagnosed with type 2 diabetes mellitus in 2011.  Matthew Barnes was initially managed with metformin  and later added on insulin  therapy due to poor control Matthew Barnes was on Lantus  until 2015.  Around 2015 Matthew Barnes was sent to Humalog  mix insulin  along with Victoza  to help with weight loss.  Chronic Diabetes Complications : Retinopathy: no. Last ophthalmology exam was done on annually. Nephropathy: no, on losartan . Peripheral neuropathy: no Coronary artery disease: no Stroke: no  Relevant comorbidities and cardiovascular risk factors: Obesity: yes Body mass index is 33.72 kg/m.  Hypertension: yes Hyperlipidemia. Yes, on statin  Current / Home Diabetic regimen includes:  Mounjaro  15 mg weekly.  Mounjaro  was started around March/April 2024.  Prior diabetic medications: Lantus , Levemir , Victoza , metformin  in the past.  Matthew Barnes was taking Novolin  70/30 : 40 units daily in the morning, stopped in January 2025.  Glycemic data:  No glucose data to review.  Patient reports Matthew Barnes has not been checking blood sugar at home.  Freestyle libre/CGM/Dexcom was prescribed in the past patient reports not covered, not able to get it.  Hypoglycemia: Patient has no hypoglycemic episodes. Patient has hypoglycemia awareness.  Factors modifying glucose control: 1.  Diabetic diet assessment: Has been eating healthy, avoiding high carbohydrate meals.  2.  Staying active or exercising: Regular exercise/biking.  3.  Medication compliance: compliant all of the time.  Interval  history  Patient has been taking Mounjaro  15 mg weekly, tolerating well, denies GI issues.  Matthew Barnes has lost 50+ pound of weight after being on Mounjaro , feeling better.  Matthew Barnes has not been checking blood sugar, no glucose data to review.  Hemoglobin A1c 5.7% today.  Matthew Barnes has no other complaints today.   REVIEW OF SYSTEMS As per history of present illness.   PAST MEDICAL HISTORY: Past Medical History:  Diagnosis Date   Bipolar II disorder - Managed by Daniellle Adegoroye [phone]) 05/03/2013   Diabetes mellitus    Dysphagia    Hyperlipidemia    Hypertension    Unspecified hypothyroidism 05/03/2013    PAST SURGICAL HISTORY: Past Surgical History:  Procedure Laterality Date   ICD IMPLANT N/A 05/20/2021   Procedure: ICD IMPLANT;  Surgeon: Waddell Danelle ORN, MD;  Location: [facility];  Service: Cardiovascular;  Laterality: N/A;   KNEE SURGERY     scope on left knee   RIGHT/LEFT HEART CATH AND CORONARY ANGIOGRAPHY N/A 08/07/2020   Procedure: RIGHT/LEFT HEART CATH AND CORONARY ANGIOGRAPHY;  Surgeon: Anner Alm ORN, MD;  Location: [facility] INVASIVE CV LAB;  Service: Cardiovascular;  Laterality: N/A;    ALLERGIES: Allergies  Allergen Reactions   Influenza Vaccines Swelling and Other (See Comments)    Reports of swelling of the arm and then was sick for 10 days  FAMILY HISTORY:  Family History  Problem Relation Age of Onset   Diabetes Mother    Hypertension Mother    Heart disease Mother    Heart disease Father    Heart attack Father 5   Diabetes Father    Hypertension Father    Depression Father    Diabetes Sister    Depression Paternal Grandmother    Depression Other     SOCIAL HISTORY: Social History   Socioeconomic History   Marital status: Single    Spouse name: Not on file   Number of children: Not on file   Years of education: Not on file   Highest education level: GED or equivalent  Occupational History   Not on file  Tobacco Use   Smoking status: Never    Smokeless tobacco: Never  Vaping Use   Vaping status: Never Used  Substance and Sexual Activity   Alcohol use: No   Drug use: No   Sexual activity: Not Currently  Other Topics Concern   Not on file  Social History Narrative   Work or School: on disability for knee arthritis      Home Situation: lives alone      Spiritual Beliefs:Christian      Lifestyle:no regular exercising; diet not great            Social Drivers of Health   Financial Resource Strain: Low Risk  (01/02/2023)   Overall Financial Resource Strain (CARDIA)    Difficulty of Paying Living Expenses: Not hard at all  Food Insecurity: No Food Insecurity (01/02/2023)   Hunger Vital Sign    Worried About Running Out of Food in the Last Year: Never true    Ran Out of Food in the Last Year: Never true  Transportation Needs: No Transportation Needs (01/02/2023)   PRAPARE - Administrator, Civil Service (Medical): No    Lack of Transportation (Non-Medical): No  Physical Activity: Sufficiently Active (01/02/2023)   Exercise Vital Sign    Days of Exercise per Week: 5 days    Minutes of Exercise per Session: 30 min  Stress: No Stress Concern Present (01/02/2023)   [employer] of Occupational Health - Occupational Stress Questionnaire    Feeling of Stress : Not at all  Social Connections: Socially Isolated (01/02/2023)   Social Connection and Isolation Panel    Frequency of Communication with Friends and Family: Twice a week    Frequency of Social Gatherings with Friends and Family: Once a week    Attends Religious Services: Never    Database Administrator or Organizations: No    Attends Engineer, Structural: Not on file    Marital Status: Divorced    MEDICATIONS:  Current Outpatient Medications  Medication Sig Dispense Refill   atorvastatin  (LIPITOR) 20 MG tablet Take 1 tablet (20 mg total) by mouth daily at bedtime 90 tablet 0   bacitracin  500 UNIT/GM ointment Apply 1 Application topically  2 (two) times daily. 120 g 0   carvedilol  (COREG ) 6.25 MG tablet Take 1 tablet (6.25 mg total) by mouth 2 (two) times daily at breakfast and at bedtime. 180 tablet 0   digoxin  (LANOXIN ) 0.125 MG tablet Take 1 tablet (0.125 mg total) by mouth at bedtime. 90 tablet 0   furosemide  (LASIX ) 20 MG tablet Take 1 tablet (20 mg total) by mouth every evening. 90 tablet 0   losartan  (COZAAR ) 25 MG tablet Take 1 tablet (25 mg total) by mouth daily. 90  tablet 1   meclizine  (ANTIVERT ) 25 MG tablet Take 0.5-1 tablets (12.5-25 mg total) by mouth 3 (three) times daily as needed for dizziness. 30 tablet 2   omeprazole  (PRILOSEC) 20 MG capsule Take 1 capsule (20 mg total) by mouth daily. 90 capsule 0   QUEtiapine  (SEROQUEL ) 200 MG tablet Take 2-3 tablets by mouth at bedtime as directed 270 tablet 2   QUEtiapine  (SEROQUEL ) 200 MG tablet Take 3 tablets (600 mg total) by mouth at bedtime. 270 tablet 1   QUEtiapine  (SEROQUEL ) 200 MG tablet Take 3 tablets (600 mg total) by mouth at bedtime. 270 tablet 1   QUEtiapine  (SEROQUEL ) 200 MG tablet Take 3 tablets (600 mg total) by mouth at bedtime. 270 tablet 1   sertraline  (ZOLOFT ) 100 MG tablet Take 1 tablet (100 mg total) by mouth daily. 90 tablet 1   tadalafil  (CIALIS ) 20 MG tablet Take 0.5-1 tablets (10-20 mg total) by mouth every other day as needed for erectile dysfunction. 10 tablet 3   ACCU-CHEK GUIDE test strip USE TO check blood glucose twice daily AS DIRECTED (Patient not taking: Reported on 02/09/2024) 100 strip 3   Accu-Chek Softclix Lancets lancets Check blood sugar 2 times daily (Patient not taking: Reported on 02/09/2024) 100 each 3   Blood Glucose Monitoring Suppl (ACCU-CHEK GUIDE) w/Device KIT Check blood sugar 2 times daily (Patient not taking: Reported on 02/09/2024) 1 kit 0   glucose blood (ACCU-CHEK GUIDE) test strip Use to check blood sugar 2 times daily (Patient not taking: Reported on 02/09/2024) 100 each 3   sertraline  (ZOLOFT ) 100 MG tablet Take 1  tablet (100 mg total) by mouth daily. 90 tablet 1   tirzepatide  (MOUNJARO ) 15 MG/0.5ML Pen Inject 15 mg into the skin once a week. 6 mL 4   No current facility-administered medications for this visit.    PHYSICAL EXAM: Vitals:   02/09/24 0951  BP: 126/80  Pulse: 90  SpO2: 98%  Weight: 255 lb 9.6 oz (115.9 kg)  Height: 6' 1 (1.854 m)    Body mass index is 33.72 kg/m.  Wt Readings from Last 3 Encounters:  02/09/24 255 lb 9.6 oz (115.9 kg)  11/03/23 260 lb (117.9 kg)  09/25/23 250 lb (113.4 kg)    General: Well developed, well nourished male in no apparent distress.  HEENT: AT/[place], no external lesions.  Eyes: Conjunctiva clear and no icterus. Neurologic: Alert, oriented, normal speech Psychiatric: Does not appear depressed or anxious  Diabetic Foot Exam - Simple   No data filed     LABS Reviewed Lab Results  Component Value Date   HGBA1C 5.7 (A) 02/09/2024   HGBA1C 6.6 (H) 08/12/2023   HGBA1C 6.2 10/28/2022   Lab Results  Component Value Date   FRUCTOSAMINE 305 (H) 01/30/2022   FRUCTOSAMINE 290 (H) 08/15/2021   FRUCTOSAMINE 301 (H) 06/18/2021   Lab Results  Component Value Date   CHOL 96 11/07/2022   HDL 22.00 (L) 11/07/2022   LDLCALC 38 10/28/2021   LDLDIRECT 53.0 11/07/2022   TRIG 257.0 (H) 11/07/2022   CHOLHDL 4 11/07/2022   Lab Results  Component Value Date   MICRALBCREAT 13 08/12/2023   Lab Results  Component Value Date   CREATININE 1.51 (H) 11/20/2023   Lab Results  Component Value Date   GFR 48.17 (L) 10/28/2022    ASSESSMENT / PLAN  1. Type 2 diabetes mellitus without complication, with long-term current use of insulin  (HCC)    Diabetes Mellitus type 2, no known complications.  -  Diabetic status / severity: Controlled  Lab Results  Component Value Date   HGBA1C 5.7 (A) 02/09/2024    - Hemoglobin A1c goal : <6.5%  - Medications:   No change.  I) continue Mounjaro  15 mg weekly.   - Home glucose testing: Advised to check blood  sugar in the morning fasting at least few times a week and asked to bring glucometer in the follow-up visit.  - Discussed/ Gave Hypoglycemia treatment plan.  # Consult : not required at this time.   # Annual urine for microalbuminuria/ creatinine ratio, no microalbuminuria currently, continue ACE/ARB on losartan .  Last  Lab Results  Component Value Date   MICRALBCREAT 13 08/12/2023    # Foot check nightly.  # Annual dilated diabetic eye exams.   - Diet: Make healthy diabetic food choices - Life style / activity / exercise: Discussed.  2. Blood pressure  -  BP Readings from Last 1 Encounters:  02/09/24 126/80    - Control is in target.  - No change in current plans.  3. Lipid status / Hyperlipidemia - Last  Lab Results  Component Value Date   LDLCALC 38 10/28/2021   - Continue atorvastatin  20 mg daily.  Matthew Barnes was seen today for diabetes.  Diagnoses and all orders for this visit:  Type 2 diabetes mellitus without complication, with long-term current use of insulin  (HCC) -     tirzepatide  (MOUNJARO ) 15 MG/0.5ML Pen; Inject 15 mg into the skin once a week. -     Hemoglobin A1c    DISPOSITION Follow up in clinic in 6 months suggested.  Labs on the same day of the visit.   All questions answered and patient verbalized understanding of the plan.  Josey Dettmann, MD [facility] Endocrinology [facility] [address] [place], [address] [place], [place] [address] Phone # [phone]  At least part of this note was generated using voice recognition software. Inadvertent word errors may have occurred, which were not recognized during the proofreading process.

## 2024-02-09 NOTE — Patient Instructions (Signed)
 Latest Reference Range & Units 08/12/23 09:36 02/09/24 00:00  Hemoglobin A1C 4.0 - 5.6 % 6.6 (H) 5.7 !  (H): Data is abnormally high !: Data is abnormal

## 2024-02-10 ENCOUNTER — Other Ambulatory Visit: Payer: Self-pay

## 2024-02-11 ENCOUNTER — Other Ambulatory Visit: Payer: Self-pay

## 2024-02-16 ENCOUNTER — Other Ambulatory Visit: Payer: Self-pay

## 2024-02-18 ENCOUNTER — Other Ambulatory Visit (HOSPITAL_BASED_OUTPATIENT_CLINIC_OR_DEPARTMENT_OTHER): Payer: Self-pay

## 2024-02-23 ENCOUNTER — Other Ambulatory Visit: Payer: Self-pay

## 2024-02-23 ENCOUNTER — Ambulatory Visit: Payer: Medicare HMO

## 2024-02-23 ENCOUNTER — Other Ambulatory Visit (HOSPITAL_BASED_OUTPATIENT_CLINIC_OR_DEPARTMENT_OTHER): Payer: Self-pay

## 2024-02-23 DIAGNOSIS — I420 Dilated cardiomyopathy: Secondary | ICD-10-CM

## 2024-02-24 ENCOUNTER — Other Ambulatory Visit: Payer: Self-pay

## 2024-02-24 LAB — CUP PACEART REMOTE DEVICE CHECK
Battery Remaining Longevity: 115 mo
Battery Voltage: 3.01 V
Brady Statistic RV Percent Paced: 0 %
Date Time Interrogation Session: 20251125012303
HighPow Impedance: 78 Ohm
Implantable Lead Connection Status: 753985
Implantable Lead Implant Date: 20230220
Implantable Lead Location: 753860
Implantable Lead Model: 6935
Implantable Pulse Generator Implant Date: 20230220
Lead Channel Impedance Value: 513 Ohm
Lead Channel Impedance Value: 570 Ohm
Lead Channel Pacing Threshold Amplitude: 0.5 V
Lead Channel Pacing Threshold Pulse Width: 0.4 ms
Lead Channel Sensing Intrinsic Amplitude: 6.625 mV
Lead Channel Sensing Intrinsic Amplitude: 6.625 mV
Lead Channel Setting Pacing Amplitude: 2 V
Lead Channel Setting Pacing Pulse Width: 0.4 ms
Lead Channel Setting Sensing Sensitivity: 0.45 mV
Zone Setting Status: 755011
Zone Setting Status: 755011

## 2024-02-26 ENCOUNTER — Other Ambulatory Visit: Payer: Self-pay

## 2024-02-26 ENCOUNTER — Emergency Department (HOSPITAL_BASED_OUTPATIENT_CLINIC_OR_DEPARTMENT_OTHER)
Admission: EM | Admit: 2024-02-26 | Discharge: 2024-02-26 | Disposition: A | Discharge status: Home / Self Care | Attending: Emergency Medicine | Admitting: Emergency Medicine

## 2024-02-26 DIAGNOSIS — E1122 Type 2 diabetes mellitus with diabetic chronic kidney disease: Secondary | ICD-10-CM | POA: Diagnosis not present

## 2024-02-26 DIAGNOSIS — R21 Rash and other nonspecific skin eruption: Secondary | ICD-10-CM | POA: Diagnosis present

## 2024-02-26 DIAGNOSIS — I129 Hypertensive chronic kidney disease with stage 1 through stage 4 chronic kidney disease, or unspecified chronic kidney disease: Secondary | ICD-10-CM | POA: Insufficient documentation

## 2024-02-26 DIAGNOSIS — N183 Chronic kidney disease, stage 3 unspecified: Secondary | ICD-10-CM | POA: Diagnosis not present

## 2024-02-26 MED ORDER — HYDROCORTISONE 1 % EX CREA
TOPICAL_CREAM | CUTANEOUS | 0 refills | Status: AC
  Filled 2024-02-26: qty 28.35, 30d supply, fill #0

## 2024-02-26 MED ORDER — PERMETHRIN 5 % EX CREA
TOPICAL_CREAM | CUTANEOUS | 0 refills | Status: DC
  Filled 2024-02-26: qty 60, 30d supply, fill #0

## 2024-02-26 MED ORDER — CEPHALEXIN 500 MG PO CAPS
500.0000 mg | ORAL_CAPSULE | Freq: Four times a day (QID) | ORAL | 0 refills | Status: DC
  Filled 2024-02-26: qty 20, 5d supply, fill #0

## 2024-02-26 NOTE — Discharge Instructions (Signed)
 Thank you for letting us  evaluate you today.  The promethazine is a cream for antiparasite and it will be applied once but leave on for 12 hours without washing off then you can wash it off following 12 hours.  Hydrocortisone cream can be applied up to 2 times daily to help with itching, swelling.  I also provided you with an antibiotic called Keflex  to ensure you do not get an infection  Provided you with to dermatology numbers.  Please call them and make an appointment  Return to Emergency Department for experience chest pain, shortness of breath, throat closing sensation, worsening symptoms

## 2024-02-26 NOTE — ED Triage Notes (Signed)
 Rash on Hands and arms x1.5 weeks. Scratching. Many scabs. No rash seen on lower extremities or torso/back. NKA and cannot correlate to an exposure of any kind.

## 2024-02-26 NOTE — ED Provider Notes (Signed)
 [name] Provider Note   CSN: [id number] Arrival date & time: 02/26/24  1651     Patient presents with: Rash   Matthew Barnes is a 68 y.o. male  {Add pertinent medical, surgical, social history, OB history to HPI:32947}  Rash      Prior to Admission medications   Medication Sig Start Date End Date Taking? Authorizing Provider  ACCU-CHEK GUIDE test strip USE TO check blood glucose twice daily AS DIRECTED Patient not taking: Reported on 02/09/2024 11/17/22   [place], MD  Accu-Chek Softclix Lancets lancets Check blood sugar 2 times daily Patient not taking: Reported on 02/09/2024 10/08/22   [place], MD  atorvastatin  (LIPITOR) 20 MG tablet Take 1 tablet (20 mg total) by mouth daily at bedtime 12/07/23   Ozell Heron CHRISTELLA, MD  bacitracin  500 UNIT/GM ointment Apply 1 Application topically 2 (two) times daily. 12/10/22   Silver Wonda LABOR, PA  Blood Glucose Monitoring Suppl (ACCU-CHEK GUIDE) w/Device KIT Check blood sugar 2 times daily Patient not taking: Reported on 02/09/2024 10/14/22   [place], MD  carvedilol  (COREG ) 6.25 MG tablet Take 1 tablet (6.25 mg total) by mouth 2 (two) times daily at breakfast and at bedtime. 12/07/23   Ozell Heron CHRISTELLA, MD  digoxin  (LANOXIN ) 0.125 MG tablet Take 1 tablet (0.125 mg total) by mouth at bedtime. 12/07/23   Ozell Heron CHRISTELLA, MD  furosemide  (LASIX ) 20 MG tablet Take 1 tablet (20 mg total) by mouth every evening. 12/07/23   Ozell Heron CHRISTELLA, MD  glucose blood (ACCU-CHEK GUIDE) test strip Use to check blood sugar 2 times daily Patient not taking: Reported on 02/09/2024 12/16/21     losartan  (COZAAR ) 25 MG tablet Take 1 tablet (25 mg total) by mouth daily. 11/03/23   Court Dorn PARAS, MD  meclizine  (ANTIVERT ) 25 MG tablet Take 0.5-1 tablets (12.5-25 mg total) by mouth 3 (three) times daily as needed for dizziness. 06/10/23   Ozell Heron CHRISTELLA, MD  omeprazole  (PRILOSEC) 20 MG capsule Take 1  capsule (20 mg total) by mouth daily. 12/07/23   Ozell Heron CHRISTELLA, MD  QUEtiapine  (SEROQUEL ) 200 MG tablet Take 2-3 tablets by mouth at bedtime as directed 04/23/22     QUEtiapine  (SEROQUEL ) 200 MG tablet Take 3 tablets (600 mg total) by mouth at bedtime. 11/03/23     QUEtiapine  (SEROQUEL ) 200 MG tablet Take 3 tablets (600 mg total) by mouth at bedtime. 11/04/23     QUEtiapine  (SEROQUEL ) 200 MG tablet Take 3 tablets (600 mg total) by mouth at bedtime. 01/21/24     sertraline  (ZOLOFT ) 100 MG tablet Take 1 tablet (100 mg total) by mouth daily. 03/18/23   Ozell Heron CHRISTELLA, MD  sertraline  (ZOLOFT ) 100 MG tablet Take 1 tablet (100 mg total) by mouth daily. 01/21/24     tadalafil  (CIALIS ) 20 MG tablet Take 0.5-1 tablets (10-20 mg total) by mouth every other day as needed for erectile dysfunction. 01/05/23   Ozell Heron CHRISTELLA, MD  tirzepatide  (MOUNJARO ) 15 MG/0.5ML Pen Inject 15 mg into the skin once a week. 02/09/24   Thapa, [place], MD    Allergies: Influenza vaccines    Review of Systems  Skin:  Positive for rash.    Updated Vital Signs BP 118/78 (BP Location: Right Arm)   Pulse 74   Temp 97.7 F (36.5 C)   Resp 16   SpO2 100%   Physical Exam Vitals and nursing note reviewed.  Constitutional:  General: He is not in acute distress.    Appearance: Normal appearance.  HENT:     Head: Normocephalic and atraumatic.  Eyes:     Conjunctiva/sclera: Conjunctivae normal.  Cardiovascular:     Rate and Rhythm: Normal rate.  Pulmonary:     Effort: Pulmonary effort is normal. No respiratory distress.  Skin:    Coloration: Skin is not jaundiced or pale.     Findings: Rash present.  Neurological:     Mental Status: He is alert. Mental status is at baseline.        (all labs ordered are listed, but only abnormal results are displayed) Labs Reviewed - No data to display  EKG: None  Radiology: No results found.  {Document cardiac monitor, telemetry assessment procedure when  appropriate:32947} Procedures   Medications Ordered in the ED - No data to display    {Click here for ABCD2, HEART and other calculators REFRESH Note before signing:1}                              Medical Decision Making  ***  {Document critical care time when appropriate  Document review of labs and clinical decision tools ie CHADS2VASC2, etc  Document your independent review of radiology images and any outside records  Document your discussion with family members, caretakers and with consultants  Document social determinants of health affecting pt's care  Document your decision making why or why not admission, treatments were needed:32947:::1}   Final diagnoses:  None    ED Discharge Orders     None

## 2024-02-27 ENCOUNTER — Other Ambulatory Visit (HOSPITAL_BASED_OUTPATIENT_CLINIC_OR_DEPARTMENT_OTHER): Payer: Self-pay

## 2024-02-29 ENCOUNTER — Telehealth: Payer: Self-pay | Admitting: *Deleted

## 2024-02-29 NOTE — Telephone Encounter (Signed)
 E-fax received from the triage nurse dated 02/26/2024 8:25am due to rash on both arms from wrist to above elbow and tops of hands.  Per chart, the patient was seen in the ER on 11/28 and placed in red folder for PCP.

## 2024-02-29 NOTE — Progress Notes (Signed)
 Remote ICD Transmission

## 2024-02-29 NOTE — Telephone Encounter (Signed)
 Reviewed ED notes and photos, he was given premethrin cream -- please have patient use the cream and if his symptoms continue then he should follow up with me in the office.

## 2024-02-29 NOTE — Telephone Encounter (Signed)
 Patient informed of the message below and stated he has an appt with dermatology on 12/8.

## 2024-03-02 ENCOUNTER — Encounter: Payer: Self-pay | Admitting: Physician Assistant

## 2024-03-02 ENCOUNTER — Ambulatory Visit: Admitting: Physician Assistant

## 2024-03-02 ENCOUNTER — Other Ambulatory Visit (HOSPITAL_BASED_OUTPATIENT_CLINIC_OR_DEPARTMENT_OTHER): Payer: Self-pay

## 2024-03-02 ENCOUNTER — Other Ambulatory Visit: Payer: Self-pay

## 2024-03-02 VITALS — BP 104/65

## 2024-03-02 DIAGNOSIS — B86 Scabies: Secondary | ICD-10-CM

## 2024-03-02 MED ORDER — PERMETHRIN 5 % EX CREA
1.0000 | TOPICAL_CREAM | CUTANEOUS | 0 refills | Status: DC
  Filled 2024-03-02: qty 60, 1d supply, fill #0
  Filled 2024-03-02: qty 120, 14d supply, fill #0
  Filled 2024-03-02: qty 60, 6d supply, fill #0
  Filled ????-??-??: fill #0

## 2024-03-02 MED ORDER — TRIAMCINOLONE ACETONIDE 0.1 % EX CREA
1.0000 | TOPICAL_CREAM | Freq: Two times a day (BID) | CUTANEOUS | 1 refills | Status: AC
  Filled 2024-03-02: qty 453, 90d supply, fill #0

## 2024-03-02 NOTE — Patient Instructions (Signed)

## 2024-03-02 NOTE — Telephone Encounter (Signed)
 Noted- ok to close.

## 2024-03-02 NOTE — Progress Notes (Signed)
   New Patient Visit   Subjective  Matthew Barnes is a 68 y.o. male NEW PATIENT who presents for the following: Rash of arms that came up about 2 weeks ago. He was seen in the ED and was given cephalexin  500 mg, hydrocortisone 1% cream and permethrin cream. The medications have not helped so far. He has not been working outside. He has not stayed at any hotels lately and he has not travelled. The rash started on his arms and is now between his finger webs and has spread up his arms and is now on portions of his thighs. Very very itchy, Lives alone.     The following portions of the chart were reviewed this encounter and updated as appropriate: medications, allergies, medical history  Review of Systems:  No other skin or systemic complaints except as noted in HPI or Assessment and Plan.  Objective  Well appearing patient in no apparent distress; mood and affect are within normal limits.   A focused examination was performed of the following areas: face, neck, arms, and hands   Relevant exam findings are noted in the Assessment and Plan.    Assessment & Plan   SCABIES Exam: Scabies prep +   Treatment Plan: Permethrin cream - Apply from neck to feet at bedtime. Leave on overnight, wash off in AM. Repeat in 7 days. Advised patient to wash bedding and clothing before showering medication off.  TMC 0.1% cream Apply to affected areas of arms twice daily until clear.   RASH AND OTHER NONSPECIFIC SKIN ERUPTION    Return if symptoms worsen or fail to improve.  I, Roseline Hutchinson, CMA, am acting as scribe for Na Waldrip K, PA-C .   Documentation: I have reviewed the above documentation for accuracy and completeness, and I agree with the above.  Yarima Penman K, PA-C

## 2024-03-03 ENCOUNTER — Other Ambulatory Visit (HOSPITAL_BASED_OUTPATIENT_CLINIC_OR_DEPARTMENT_OTHER): Payer: Self-pay

## 2024-03-03 ENCOUNTER — Ambulatory Visit: Payer: Self-pay | Admitting: Internal Medicine

## 2024-03-07 ENCOUNTER — Ambulatory Visit: Admitting: Physician Assistant

## 2024-03-10 ENCOUNTER — Other Ambulatory Visit: Payer: Self-pay

## 2024-03-10 ENCOUNTER — Encounter: Payer: Self-pay | Admitting: Family Medicine

## 2024-03-10 ENCOUNTER — Ambulatory Visit (INDEPENDENT_AMBULATORY_CARE_PROVIDER_SITE_OTHER): Admitting: Family Medicine

## 2024-03-10 ENCOUNTER — Other Ambulatory Visit (HOSPITAL_BASED_OUTPATIENT_CLINIC_OR_DEPARTMENT_OTHER): Payer: Self-pay

## 2024-03-10 VITALS — BP 106/60 | HR 85 | Temp 97.6°F | Ht 71.26 in | Wt 253.4 lb

## 2024-03-10 DIAGNOSIS — I5022 Chronic systolic (congestive) heart failure: Secondary | ICD-10-CM | POA: Diagnosis not present

## 2024-03-10 DIAGNOSIS — E7800 Pure hypercholesterolemia, unspecified: Secondary | ICD-10-CM | POA: Diagnosis not present

## 2024-03-10 DIAGNOSIS — B86 Scabies: Secondary | ICD-10-CM

## 2024-03-10 DIAGNOSIS — K219 Gastro-esophageal reflux disease without esophagitis: Secondary | ICD-10-CM | POA: Diagnosis not present

## 2024-03-10 DIAGNOSIS — F3160 Bipolar disorder, current episode mixed, unspecified: Secondary | ICD-10-CM | POA: Diagnosis not present

## 2024-03-10 MED ORDER — CARVEDILOL 6.25 MG PO TABS
6.2500 mg | ORAL_TABLET | Freq: Two times a day (BID) | ORAL | 1 refills | Status: AC
  Filled 2024-03-10: qty 180, 90d supply, fill #0
  Filled 2024-03-10: qty 60, 30d supply, fill #0
  Filled 2024-04-11 – 2024-04-13 (×2): qty 60, 30d supply, fill #1
  Filled 2024-04-22: qty 60, 30d supply, fill #2

## 2024-03-10 MED ORDER — SERTRALINE HCL 100 MG PO TABS
100.0000 mg | ORAL_TABLET | Freq: Every day | ORAL | 1 refills | Status: AC
  Filled 2024-03-10: qty 30, 30d supply, fill #0
  Filled 2024-03-10: qty 90, 90d supply, fill #0
  Filled 2024-04-11 – 2024-04-27 (×3): qty 30, 30d supply, fill #1

## 2024-03-10 MED ORDER — IVERMECTIN 3 MG PO TABS
ORAL_TABLET | ORAL | 0 refills | Status: DC
  Filled 2024-03-10: qty 16, 2d supply, fill #0

## 2024-03-10 MED ORDER — FUROSEMIDE 20 MG PO TABS
20.0000 mg | ORAL_TABLET | Freq: Every evening | ORAL | 0 refills | Status: AC
  Filled 2024-03-10: qty 90, 90d supply, fill #0
  Filled 2024-03-10: qty 30, 30d supply, fill #0
  Filled 2024-04-11 – 2024-04-13 (×2): qty 30, 30d supply, fill #1
  Filled 2024-04-22: qty 30, 30d supply, fill #2

## 2024-03-10 MED ORDER — DIGOXIN 125 MCG PO TABS
0.1250 mg | ORAL_TABLET | Freq: Every day | ORAL | 1 refills | Status: AC
  Filled 2024-03-10: qty 90, 90d supply, fill #0
  Filled 2024-03-10: qty 30, 30d supply, fill #0
  Filled 2024-04-11 – 2024-04-13 (×2): qty 30, 30d supply, fill #1
  Filled 2024-04-22: qty 30, 30d supply, fill #2

## 2024-03-10 MED ORDER — ATORVASTATIN CALCIUM 20 MG PO TABS
20.0000 mg | ORAL_TABLET | Freq: Every day | ORAL | 1 refills | Status: AC
  Filled 2024-03-10: qty 30, 30d supply, fill #0
  Filled 2024-03-10: qty 90, 90d supply, fill #0
  Filled 2024-04-11 – 2024-04-13 (×2): qty 30, 30d supply, fill #1
  Filled 2024-04-22: qty 30, 30d supply, fill #2

## 2024-03-10 MED ORDER — OMEPRAZOLE 20 MG PO CPDR
20.0000 mg | DELAYED_RELEASE_CAPSULE | Freq: Every day | ORAL | 0 refills | Status: AC
  Filled 2024-03-10: qty 30, 30d supply, fill #0
  Filled 2024-03-10: qty 90, 90d supply, fill #0
  Filled 2024-04-11 – 2024-04-13 (×2): qty 30, 30d supply, fill #1
  Filled 2024-04-22: qty 30, 30d supply, fill #2

## 2024-03-10 NOTE — Progress Notes (Signed)
 Established Patient Office Visit  Subjective   Patient ID: Matthew Barnes, male    DOB: 1955/05/11  Age: 68 y.o. MRN: [id number]  Chief Complaint  Patient presents with   Annual Exam    HPI Discussed the use of AI scribe software for clinical note transcription with the patient, who gave verbal consent to proceed.  History of Present Illness   Matthew Barnes is a 68 year old male who presents with persistent itching and rash despite treatment for scabies.  He has a persistent, worsening pruritic rash initially diagnosed as scabies by a dermatologist. He applied permethrin  cream twice as instructed and used an anti-itch cream without relief. The itching is severe and now involves his thighs, legs, and arms.  The dermatologist diagnosed scabies from skin samples, but symptoms have not improved with prescribed treatments, including antibiotics. He has washed clothes and bedding in hot water and discarded some items to prevent reinfestation.  He reports an 84-pound intentional weight loss and is pleased with this. He lives alone and has no close contacts at home.  He takes carvedilol , losartan , sertraline  100 mg daily, and Seroquel  from his psychiatrist. His recent A1c was 5.7, and he is followed by an endocrinologist for thyroid  monitoring. He denies recent changes in body washes or detergents.       Current Outpatient Medications  Medication Instructions   ACCU-CHEK GUIDE test strip USE TO check blood glucose twice daily AS DIRECTED   Accu-Chek Softclix Lancets lancets Check blood sugar 2 times daily   atorvastatin  (LIPITOR) 20 MG tablet Take 1 tablet (20 mg total) by mouth daily at bedtime   bacitracin  500 UNIT/GM ointment 1 Application, Topical, 2 times daily   Blood Glucose Monitoring Suppl (ACCU-CHEK GUIDE) w/Device KIT Check blood sugar 2 times daily   carvedilol  (COREG ) 6.25 MG tablet Take 1 tablet (6.25 mg total) by mouth 2 (two) times daily at breakfast and at  bedtime.   digoxin  (LANOXIN ) 0.125 mg, Oral, Daily at bedtime   furosemide  (LASIX ) 20 mg, Oral, Every evening   glucose blood (ACCU-CHEK GUIDE) test strip Use to check blood sugar 2 times daily   hydrocortisone  cream 1 % Apply to affected area 2 times daily.   ivermectin (STROMECTOL) 3 MG TABS tablet Take 8 tablets on day 1, then take 8 tablets after 7-10 days.   losartan  (COZAAR ) 25 mg, Oral, Daily   meclizine  (ANTIVERT ) 12.5-25 mg, Oral, 3 times daily PRN   omeprazole  (PRILOSEC) 20 mg, Oral, Daily   QUEtiapine  (SEROQUEL ) 200 MG tablet Take 2-3 tablets by mouth at bedtime as directed   QUEtiapine  (SEROQUEL ) 600 mg, Oral, Daily at bedtime   QUEtiapine  (SEROQUEL ) 600 mg, Oral, Daily at bedtime   QUEtiapine  (SEROQUEL ) 600 mg, Oral, Daily at bedtime   sertraline  (ZOLOFT ) 100 mg, Oral, Daily   tadalafil  (CIALIS ) 10-20 mg, Oral, Every 48 hours PRN   tirzepatide  (MOUNJARO ) 15 mg, Subcutaneous, Weekly   triamcinolone  cream (KENALOG ) 0.1 % 1 Application, Topical, 2 times daily, Apply to affected areas of arms twice daily until clear.    Patient Active Problem List   Diagnosis Date Noted   Dizziness 11/02/2022   ICD (implantable cardioverter-defibrillator) in place 09/11/2021   Morbid obesity (HCC) 04/12/2021   Pulmonary hypertension, unspecified (HCC) 04/12/2021   Chronic kidney disease, stage 3b (HCC) 04/12/2021   Dilated cardiomyopathy (HCC)    Elevated troponin level not due to acute coronary syndrome    Chronic systolic CHF (congestive heart failure) (HCC)  08/06/2020   Difficulty with speech 07/30/2020   Chest pain 03/26/2018   Dyspnea on exertion 03/26/2018   Family history of heart disease 03/26/2018   Leg edema 10/27/2016   Bipolar affective, mixed (HCC) 04/29/2016   Esophageal reflux 01/17/2014   Type 2 diabetes mellitus with diabetic neuropathy, with long-term current use of insulin  (HCC) 08/31/2013   Primary hypertension 05/03/2013   Hyperlipidemia 05/03/2013   Hypothyroidism  05/03/2013     Review of Systems  All other systems reviewed and are negative.     Objective:     BP 106/60   Pulse 85   Temp 97.6 F (36.4 C) (Oral)   Ht 5' 11.26 (1.81 m)   Wt 253 lb 6.4 oz (114.9 kg)   SpO2 94%   BMI 35.08 kg/m    Physical Exam Vitals reviewed.  Constitutional:      Appearance: Normal appearance.  Cardiovascular:     Pulses: Normal pulses.  Pulmonary:     Effort: Pulmonary effort is normal.  Musculoskeletal:     Right lower leg: No edema.     Left lower leg: No edema.  Neurological:     Mental Status: He is alert and oriented to person, place, and time. Mental status is at baseline.  Psychiatric:        Mood and Affect: Mood normal.        Behavior: Behavior normal.             No results found for any visits on 03/10/24.    The ASCVD Risk score (Arnett DK, et al., 2019) failed to calculate for the following reasons:   The valid total cholesterol range is 130 to 320 mg/dL    Assessment & Plan:  Scabies infestation -     Ivermectin; Take 8 tablets on day 1, then take 8 tablets after 7-10 days.  Dispense: 16 tablet; Refill: 0  Pure hypercholesterolemia Assessment & Plan: Chronic, on atorvastatin , needs updated labs today, I have refilled his statin and ordered lipid panel.   Orders: -     Atorvastatin  Calcium ; Take 1 tablet (20 mg total) by mouth daily at bedtime  Dispense: 90 tablet; Refill: 1 -     Lipid panel; Future  Chronic systolic CHF (congestive heart failure) (HCC) -     Carvedilol ; Take 1 tablet (6.25 mg total) by mouth 2 (two) times daily at breakfast and at bedtime.  Dispense: 180 tablet; Refill: 1 -     Digoxin ; Take 1 tablet (0.125 mg total) by mouth at bedtime.  Dispense: 90 tablet; Refill: 1 -     Furosemide ; Take 1 tablet (20 mg total) by mouth every evening.  Dispense: 90 tablet; Refill: 0  Gastroesophageal reflux disease, unspecified whether esophagitis present -     Omeprazole ; Take 1 capsule (20 mg  total) by mouth daily.  Dispense: 90 capsule; Refill: 0  Bipolar affective, mixed (HCC) -     Sertraline  HCl; Take 1 tablet (100 mg total) by mouth daily.  Dispense: 90 tablet; Refill: 1  Morbid obesity (HCC) Assessment & Plan: BMI was 40 in 2023 with multiple co morbid conditions including diabetes and HLD.  Patient has lost 84 pounds in the last 2 years ,BMI is reduced to 35, continue mounjaro  therapy for diabetes which is also improving his weight loss. Diabetes is managed by Dr. Mercie.     Assessment and Plan    Scabies infestation Persistent scabies infestation despite two treatments with permethrin  cream. Symptoms include severe  itching and rash on thighs, legs, and arms. Dermatologist confirmed scabies via microscopic examination. No improvement with permethrin , suggesting possible resistance or misdiagnosis. No signs of bacterial infection. Consideration of oral ivermectin due to lack of response to topical treatment. - Prescribed oral ivermectin: 4 tablets (6 mg each) on day 1, repeat in 7-10 days. - Advised use of anti-itch creams such as hydrocortisone  or triamcinolone  as needed. - Recommended over-the-counter antihistamines like Claritin , Zyrtec , or Allegra to reduce itching. - Sent photos of rash to dermatologist for further evaluation. - If no improvement with ivermectin, will refer back to dermatologist for further management.  Pure hypercholesterolemia Cholesterol levels require monitoring. Last cholesterol test was in August of the previous year. - Ordered cholesterol test. - Advised to schedule cholesterol test at convenience, possibly after resolution of scabies symptoms.        Return for annual physical -- please reschedule patient.SABRA Heron CHRISTELLA Ozell, MD

## 2024-03-10 NOTE — Assessment & Plan Note (Signed)
 BMI was 40 in 2023 with multiple co morbid conditions including diabetes and HLD.  Patient has lost 84 pounds in the last 2 years ,BMI is reduced to 35, continue mounjaro  therapy for diabetes which is also improving his weight loss. Diabetes is managed by Dr. Mercie.

## 2024-03-10 NOTE — Assessment & Plan Note (Signed)
 Chronic, on atorvastatin , needs updated labs today, I have refilled his statin and ordered lipid panel.

## 2024-03-11 ENCOUNTER — Other Ambulatory Visit: Payer: Self-pay

## 2024-03-13 ENCOUNTER — Other Ambulatory Visit: Payer: Self-pay

## 2024-03-14 ENCOUNTER — Telehealth: Payer: Self-pay

## 2024-03-14 ENCOUNTER — Other Ambulatory Visit (HOSPITAL_BASED_OUTPATIENT_CLINIC_OR_DEPARTMENT_OTHER): Payer: Self-pay

## 2024-03-14 DIAGNOSIS — B86 Scabies: Secondary | ICD-10-CM

## 2024-03-14 MED ORDER — IVERMECTIN 3 MG PO TABS
ORAL_TABLET | ORAL | 0 refills | Status: AC
  Filled 2024-03-14: qty 8, 1d supply, fill #0

## 2024-03-14 NOTE — Telephone Encounter (Signed)
 Patient informed of the message below.

## 2024-03-14 NOTE — Telephone Encounter (Signed)
 That was not what was recommended-- he was supposed to wait 7-10 days before taking the second round. I have put a script for another 8 tablets but he will not be able to pick it up until 12/22. He may take them on 12/22

## 2024-03-14 NOTE — Telephone Encounter (Signed)
 Copied from CRM [id number]. Topic: Clinical - Medication Question >> Mar 14, 2024 10:59 AM Nessti S wrote: Reason for CRM: pt called in because he took ivermectin  (STROMECTOL ) 3 MG TABS tablet. He took the 1st 8 tablets but it didn't work then he took the 2nd 8 and it worked for him. He wants to know is he able to get more? Call back number  [phone]

## 2024-04-11 ENCOUNTER — Other Ambulatory Visit: Payer: Self-pay

## 2024-04-11 ENCOUNTER — Other Ambulatory Visit (HOSPITAL_BASED_OUTPATIENT_CLINIC_OR_DEPARTMENT_OTHER): Payer: Self-pay

## 2024-04-12 ENCOUNTER — Other Ambulatory Visit (HOSPITAL_BASED_OUTPATIENT_CLINIC_OR_DEPARTMENT_OTHER): Payer: Self-pay

## 2024-04-12 ENCOUNTER — Other Ambulatory Visit (HOSPITAL_COMMUNITY): Payer: Self-pay

## 2024-04-12 ENCOUNTER — Other Ambulatory Visit: Payer: Self-pay

## 2024-04-13 ENCOUNTER — Other Ambulatory Visit: Payer: Self-pay

## 2024-04-13 ENCOUNTER — Other Ambulatory Visit (HOSPITAL_COMMUNITY): Payer: Self-pay

## 2024-04-13 ENCOUNTER — Other Ambulatory Visit (HOSPITAL_BASED_OUTPATIENT_CLINIC_OR_DEPARTMENT_OTHER): Payer: Self-pay

## 2024-04-14 ENCOUNTER — Other Ambulatory Visit (HOSPITAL_COMMUNITY): Payer: Self-pay

## 2024-04-14 ENCOUNTER — Other Ambulatory Visit: Payer: Self-pay

## 2024-04-16 ENCOUNTER — Other Ambulatory Visit (HOSPITAL_BASED_OUTPATIENT_CLINIC_OR_DEPARTMENT_OTHER): Payer: Self-pay

## 2024-04-22 ENCOUNTER — Other Ambulatory Visit (HOSPITAL_BASED_OUTPATIENT_CLINIC_OR_DEPARTMENT_OTHER): Payer: Self-pay

## 2024-04-22 ENCOUNTER — Other Ambulatory Visit: Payer: Self-pay | Admitting: Family Medicine

## 2024-04-22 ENCOUNTER — Other Ambulatory Visit: Payer: Self-pay | Admitting: Cardiovascular Disease

## 2024-04-22 DIAGNOSIS — I10 Essential (primary) hypertension: Secondary | ICD-10-CM

## 2024-04-22 DIAGNOSIS — N528 Other male erectile dysfunction: Secondary | ICD-10-CM

## 2024-04-22 MED ORDER — TADALAFIL 20 MG PO TABS
10.0000 mg | ORAL_TABLET | ORAL | 3 refills | Status: AC | PRN
  Filled 2024-04-22: qty 10, 20d supply, fill #0

## 2024-04-22 MED ORDER — LOSARTAN POTASSIUM 25 MG PO TABS
25.0000 mg | ORAL_TABLET | Freq: Every day | ORAL | 1 refills | Status: AC
  Filled 2024-04-22: qty 90, 90d supply, fill #0

## 2024-04-25 ENCOUNTER — Emergency Department (HOSPITAL_BASED_OUTPATIENT_CLINIC_OR_DEPARTMENT_OTHER)

## 2024-04-25 ENCOUNTER — Other Ambulatory Visit: Payer: Self-pay

## 2024-04-25 ENCOUNTER — Emergency Department (HOSPITAL_BASED_OUTPATIENT_CLINIC_OR_DEPARTMENT_OTHER)
Admission: EM | Admit: 2024-04-25 | Discharge: 2024-04-25 | Disposition: A | Discharge status: Home / Self Care | Attending: Emergency Medicine | Admitting: Emergency Medicine

## 2024-04-25 ENCOUNTER — Encounter (HOSPITAL_BASED_OUTPATIENT_CLINIC_OR_DEPARTMENT_OTHER): Payer: Self-pay

## 2024-04-25 DIAGNOSIS — S0990XA Unspecified injury of head, initial encounter: Secondary | ICD-10-CM | POA: Diagnosis present

## 2024-04-25 DIAGNOSIS — R911 Solitary pulmonary nodule: Secondary | ICD-10-CM | POA: Diagnosis not present

## 2024-04-25 DIAGNOSIS — I110 Hypertensive heart disease with heart failure: Secondary | ICD-10-CM | POA: Insufficient documentation

## 2024-04-25 DIAGNOSIS — F319 Bipolar disorder, unspecified: Secondary | ICD-10-CM | POA: Insufficient documentation

## 2024-04-25 DIAGNOSIS — W19XXXA Unspecified fall, initial encounter: Secondary | ICD-10-CM

## 2024-04-25 DIAGNOSIS — S298XXA Other specified injuries of thorax, initial encounter: Secondary | ICD-10-CM

## 2024-04-25 DIAGNOSIS — W000XXA Fall on same level due to ice and snow, initial encounter: Secondary | ICD-10-CM | POA: Insufficient documentation

## 2024-04-25 DIAGNOSIS — M546 Pain in thoracic spine: Secondary | ICD-10-CM | POA: Insufficient documentation

## 2024-04-25 DIAGNOSIS — I509 Heart failure, unspecified: Secondary | ICD-10-CM | POA: Diagnosis not present

## 2024-04-25 DIAGNOSIS — Z79899 Other long term (current) drug therapy: Secondary | ICD-10-CM | POA: Diagnosis not present

## 2024-04-25 DIAGNOSIS — E119 Type 2 diabetes mellitus without complications: Secondary | ICD-10-CM | POA: Diagnosis not present

## 2024-04-25 DIAGNOSIS — Z7985 Long-term (current) use of injectable non-insulin antidiabetic drugs: Secondary | ICD-10-CM | POA: Insufficient documentation

## 2024-04-25 DIAGNOSIS — Y92009 Unspecified place in unspecified non-institutional (private) residence as the place of occurrence of the external cause: Secondary | ICD-10-CM | POA: Diagnosis not present

## 2024-04-25 DIAGNOSIS — W01198A Fall on same level from slipping, tripping and stumbling with subsequent striking against other object, initial encounter: Secondary | ICD-10-CM | POA: Insufficient documentation

## 2024-04-25 LAB — BASIC METABOLIC PANEL WITH GFR
Anion gap: 9 (ref 5–15)
BUN: 14 mg/dL (ref 8–23)
CO2: 29 mmol/L (ref 22–32)
Calcium: 9.6 mg/dL (ref 8.9–10.3)
Chloride: 103 mmol/L (ref 98–111)
Creatinine, Ser: 1.14 mg/dL (ref 0.61–1.24)
GFR, Estimated: >60 mL/min
Glucose, Bld: 142 mg/dL — ABNORMAL HIGH (ref 70–99)
Potassium: 4.1 mmol/L (ref 3.5–5.1)
Sodium: 141 mmol/L (ref 135–145)

## 2024-04-25 LAB — CBC
HCT: 38.3 % — ABNORMAL LOW (ref 39.0–52.0)
Hemoglobin: 12.4 g/dL — ABNORMAL LOW (ref 13.0–17.0)
MCH: 26.3 pg (ref 26.0–34.0)
MCHC: 32.4 g/dL (ref 30.0–36.0)
MCV: 81.1 fL (ref 80.0–100.0)
Platelets: 126 10*3/uL — ABNORMAL LOW (ref 150–400)
RBC: 4.72 MIL/uL (ref 4.22–5.81)
RDW: 13.9 % (ref 11.5–15.5)
WBC: 5 10*3/uL (ref 4.0–10.5)
nRBC: 0 % (ref 0.0–0.2)

## 2024-04-25 MED ORDER — OXYCODONE-ACETAMINOPHEN 5-325 MG PO TABS
1.0000 | ORAL_TABLET | Freq: Once | ORAL | Status: AC
  Administered 2024-04-25: 1 via ORAL
  Filled 2024-04-25: qty 1

## 2024-04-25 MED ORDER — CARVEDILOL 6.25 MG PO TABS
6.2500 mg | ORAL_TABLET | Freq: Once | ORAL | Status: AC
  Administered 2024-04-25: 6.25 mg via ORAL
  Filled 2024-04-25: qty 1

## 2024-04-25 MED ORDER — LOSARTAN POTASSIUM 25 MG PO TABS
25.0000 mg | ORAL_TABLET | Freq: Once | ORAL | Status: AC
  Administered 2024-04-25: 25 mg via ORAL
  Filled 2024-04-25: qty 1

## 2024-04-25 MED ORDER — ACETAMINOPHEN 325 MG PO TABS
650.0000 mg | ORAL_TABLET | Freq: Once | ORAL | Status: AC
  Administered 2024-04-25: 650 mg via ORAL
  Filled 2024-04-25: qty 2

## 2024-04-25 MED ORDER — HYDROCODONE-ACETAMINOPHEN 5-325 MG PO TABS
2.0000 | ORAL_TABLET | ORAL | 0 refills | Status: AC | PRN
  Filled 2024-04-25: qty 10, 2d supply, fill #0

## 2024-04-25 MED ORDER — LIDOCAINE 5 % EX PTCH
2.0000 | MEDICATED_PATCH | CUTANEOUS | Status: DC
  Administered 2024-04-25: 2 via TRANSDERMAL
  Filled 2024-04-25: qty 2

## 2024-04-25 NOTE — ED Provider Notes (Signed)
" °  Physical Exam  BP (!) 171/95   Pulse 71   Temp (!) 97.4 F (36.3 C) (Oral)   Resp 19   Ht 5' 11 (1.803 m)   Wt 106.1 kg   SpO2 97%   BMI 32.64 kg/m   Physical Exam  Procedures  Procedures  ED Course / MDM   Clinical Course as of 04/26/24 1155  Mon Apr 25, 2024  2104 Assumed primary care of the patient from Lonni Camp PA.  [RP]    Clinical Course User Index [RP] Yolande Lamar BROCKS, MD   Medical Decision Making Amount and/or Complexity of Data Reviewed Labs: ordered. Radiology: ordered.  Risk OTC drugs. Prescription drug management.   CT of the head and C-spine without acute findings.  CT of the chest without evidence of fracture or other traumatic injury.  Does have a lung nodule but the patient was notified of the fact that he will need to follow-up with his primary doctor about this and potentially have imaging in 6 to 12 months. Labs unremarkable.  Will treat him for bruised ribs.  Given incentive spirometer and pain medication to take at home.  Blood pressure did improve with his home antihypertensives.  Will have him continue these at home and follow-up with his primary doctor to see where his blood pressure is after he has been taking his medications more consistently instead of starting him on an additional agent at this point in time.       Yolande Lamar BROCKS, MD 04/26/24 1157  "

## 2024-04-25 NOTE — ED Provider Notes (Signed)
 " [place] [facility] AT [facility] Provider Note   CSN: [id number] Arrival date & time: 04/25/24  1928     Patient presents with: Matthew Barnes is a 69 y.o. male.   The history is provided by the patient, medical records and the EMS personnel. No language interpreter was used.  Fall     69 year old male with history of hypertension, diabetes, bipolar, CHF, brought here via EMS from home for evaluation of a fall.  Patient states today he was outside slipped on a piece of eyes and fell backward.  He struck the back of his head as well as his right upper back against the ground.  He denies any loss of consciousness.  He was able to stand up with assistance of other people.  He is currently complaining of pain primarily to the right side of his back.  Pain is sharp throbbing worse with movement.  He endorses mild headache but denies any significant neck pain or low back pain no pain to his other extremities.  He is not on any blood thinner medication.  Incident happened approximately 2 hours ago.  He was noted to have elevated blood pressure and states he forgot to take his antihypertensive medication today.  Patient denies any precipitating symptoms prior to the fall.  Prior to Admission medications  Medication Sig Start Date End Date Taking? Authorizing Provider  ACCU-CHEK GUIDE test strip USE TO check blood glucose twice daily AS DIRECTED 11/17/22   [place], MD  Accu-Chek Softclix Lancets lancets Check blood sugar 2 times daily 10/08/22   [place], MD  atorvastatin  (LIPITOR) 20 MG tablet Take 1 tablet (20 mg total) by mouth daily at bedtime 03/10/24   Ozell Heron CHRISTELLA, MD  bacitracin  500 UNIT/GM ointment Apply 1 Application topically 2 (two) times daily. 12/10/22   Silver Wonda LABOR, PA  Blood Glucose Monitoring Suppl (ACCU-CHEK GUIDE) w/Device KIT Check blood sugar 2 times daily 10/14/22   [place], MD  carvedilol  (COREG ) 6.25 MG tablet Take 1 tablet  (6.25 mg total) by mouth 2 (two) times daily at breakfast and at bedtime. 03/10/24   Ozell Heron CHRISTELLA, MD  digoxin  (LANOXIN ) 0.125 MG tablet Take 1 tablet (0.125 mg total) by mouth at bedtime. 03/10/24   Ozell Heron CHRISTELLA, MD  furosemide  (LASIX ) 20 MG tablet Take 1 tablet (20 mg total) by mouth every evening. 03/10/24   Ozell Heron CHRISTELLA, MD  glucose blood (ACCU-CHEK GUIDE) test strip Use to check blood sugar 2 times daily 12/16/21     hydrocortisone  cream 1 % Apply to affected area 2 times daily. 02/26/24   Minnie Tinnie BRAVO, PA  ivermectin  (STROMECTOL ) 3 MG TABS tablet Take 8 tablets on day 1, then take 8 tablets after 7-10 days. 03/21/24   Ozell Heron CHRISTELLA, MD  losartan  (COZAAR ) 25 MG tablet Take 1 tablet (25 mg total) by mouth daily. 04/22/24   Court Dorn PARAS, MD  meclizine  (ANTIVERT ) 25 MG tablet Take 0.5-1 tablets (12.5-25 mg total) by mouth 3 (three) times daily as needed for dizziness. 06/10/23   Ozell Heron CHRISTELLA, MD  omeprazole  (PRILOSEC) 20 MG capsule Take 1 capsule (20 mg total) by mouth daily. 03/10/24   Ozell Heron CHRISTELLA, MD  QUEtiapine  (SEROQUEL ) 200 MG tablet Take 2-3 tablets by mouth at bedtime as directed 04/23/22     QUEtiapine  (SEROQUEL ) 200 MG tablet Take 3 tablets (600 mg total) by mouth at bedtime. 11/03/23     QUEtiapine  (  SEROQUEL ) 200 MG tablet Take 3 tablets (600 mg total) by mouth at bedtime. 11/04/23     QUEtiapine  (SEROQUEL ) 200 MG tablet Take 3 tablets (600 mg total) by mouth at bedtime. 01/21/24     sertraline  (ZOLOFT ) 100 MG tablet Take 1 tablet (100 mg total) by mouth daily. 03/10/24   Ozell Heron HERO, MD  tadalafil  (CIALIS ) 20 MG tablet Take 0.5-1 tablets (10-20 mg total) by mouth every other day as needed for erectile dysfunction. 04/22/24   Ozell Heron HERO, MD  tirzepatide  (MOUNJARO ) 15 MG/0.5ML Pen Inject 15 mg into the skin once a week. 02/09/24   Thapa, [place], MD  triamcinolone  cream (KENALOG ) 0.1 % Apply 1 Application topically 2 (two) times daily. Apply  to affected areas of arms twice daily until clear. 03/02/24   Sandridge, Brenda K, PA-C    Allergies: Influenza vaccines    Review of Systems  All other systems reviewed and are negative.   Updated Vital Signs BP (!) 204/104 (BP Location: Right Arm)   Pulse 64   Temp (!) 97.4 F (36.3 C) (Oral)   Resp 20   Ht 5' 11 (1.803 m)   Wt 106.1 kg   SpO2 96%   BMI 32.64 kg/m   Physical Exam Constitutional:      General: He is not in acute distress.    Appearance: He is well-developed.  HENT:     Head: Normocephalic and atraumatic.  Eyes:     Conjunctiva/sclera: Conjunctivae normal.  Cardiovascular:     Rate and Rhythm: Normal rate and regular rhythm.     Pulses: Normal pulses.     Heart sounds: Normal heart sounds.  Pulmonary:     Effort: Pulmonary effort is normal.     Breath sounds: Normal breath sounds. No wheezing, rhonchi or rales.  Chest:     Chest wall: Tenderness (Tenderness to right anterolateral chest wall as well as right posterior back at the scapula region without any bruising or crepitus) present.  Abdominal:     Palpations: Abdomen is soft.     Tenderness: There is no abdominal tenderness.  Musculoskeletal:        General: No tenderness (No midline spine tenderness).     Cervical back: Normal range of motion and neck supple. No tenderness.  Skin:    Findings: No rash.  Neurological:     Mental Status: He is alert and oriented to person, place, and time.     (all labs ordered are listed, but only abnormal results are displayed) Labs Reviewed - No data to display  EKG: None  Radiology: No results found.   Procedures   Medications Ordered in the ED - No data to display                                  Medical Decision Making Amount and/or Complexity of Data Reviewed Labs: ordered. Radiology: ordered.  Risk Prescription drug management.   BP (!) 204/104 (BP Location: Right Arm)   Pulse 64   Temp (!) 97.4 F (36.3 C) (Oral)   Resp 20    Ht 5' 11 (1.803 m)   Wt 106.1 kg   SpO2 96%   BMI 32.64 kg/m   59:42 PM  69 year old male with history of hypertension, diabetes, bipolar, CHF, brought here via EMS from home for evaluation of a fall.  Patient states today he was outside slipped on a piece of eyes  and fell backward.  He struck the back of his head as well as his right upper back against the ground.  He denies any loss of consciousness.  He was able to stand up with assistance of other people.  He is currently complaining of pain primarily to the right side of his back.  Pain is sharp throbbing worse with movement.  He endorses mild headache but denies any significant neck pain or low back pain no pain to his other extremities.  He is not on any blood thinner medication.  Incident happened approximately 2 hours ago.  He was noted to have elevated blood pressure and states he forgot to take his antihypertensive medication today.  Patient denies any precipitating symptoms prior to the fall.  On exam patient is laying bed in no acute discomfort.  He does have reproducible tenderness to his right upper back and right anterolateral chest wall without any crepitus or emphysema no bruising noted.  No other significant signs of injury.  Lungs are clear.  Vital sign notable for elevated blood pressure of 204/104.  Patient is afebrile no hypoxia.  Elevated blood pressure may be due to pain, pain medication given, since patient did not take his antihypertensive medication today, I will also provide patient with his home blood pressure medication.  Given his age, will obtain CT scan of head, cervical spine, and chest for further assessment.  Pt sign out to oncoming provider who will f/u on imaging and determine disposition.      Final diagnoses:  None    ED Discharge Orders     None          Nivia Colon, PA-C 04/25/24 2104  "

## 2024-04-25 NOTE — ED Triage Notes (Addendum)
 Pt bib EMS from home after slipping on black ice around 5:30pm. (-) thinners, (-) LOC. Pt reports hitting head and back. Pain primarily reported in mid back. Denies cervical tenderness. NAD noted in triage.   SBP 190, did not take antihypertensive meds today.

## 2024-04-25 NOTE — Discharge Instructions (Addendum)
 You were seen for your rib pain in the emergency department.  It is likely that you have a bruised rib.  At home, please take Tylenol  and use lidocaine  patches for your pain. These can be obtained over the counter. Please also use the incentive spirometer we have given you to prevent any pneumonias. You may also take the norco we have prescribed you for any breakthrough pain that may have.  Please note that it contains tylenol  so do not exceed the daily recommended dosage of tylenol . Do not take this before driving or operating heavy machinery.  Do not take this medication with alcohol.  Check your MyChart online for the results of any tests that had not resulted by the time you left the emergency department.   Follow-up with your primary doctor in 2-3 days regarding your visit.  Please also talk to them about your blood pressure and lung nodule  Return immediately to the emergency department if you experience any of the following: Difficulty breathing, fever, severe pain, or any other concerning symptoms.    Thank you for visiting our Emergency Department. It was a pleasure taking care of you today.

## 2024-04-26 ENCOUNTER — Other Ambulatory Visit (HOSPITAL_BASED_OUTPATIENT_CLINIC_OR_DEPARTMENT_OTHER): Payer: Self-pay

## 2024-04-27 ENCOUNTER — Other Ambulatory Visit: Payer: Self-pay

## 2024-04-27 ENCOUNTER — Other Ambulatory Visit (HOSPITAL_BASED_OUTPATIENT_CLINIC_OR_DEPARTMENT_OTHER): Payer: Self-pay

## 2024-05-24 ENCOUNTER — Ambulatory Visit

## 2024-08-09 ENCOUNTER — Ambulatory Visit: Admitting: Endocrinology

## 2024-08-23 ENCOUNTER — Ambulatory Visit

## 2024-11-22 ENCOUNTER — Ambulatory Visit
